# Patient Record
Sex: Male | Born: 1970 | Race: White | Hispanic: No | Smoking: Current every day smoker
Health system: Southern US, Community
[De-identification: ages and names within clinical notes are randomized; demographics above are authoritative.]

## PROBLEM LIST (undated history)

## (undated) DIAGNOSIS — J449 Chronic obstructive pulmonary disease, unspecified: Secondary | ICD-10-CM

## (undated) DIAGNOSIS — Z794 Long term (current) use of insulin: Secondary | ICD-10-CM

## (undated) DIAGNOSIS — K08109 Complete loss of teeth, unspecified cause, unspecified class: Secondary | ICD-10-CM

## (undated) DIAGNOSIS — I509 Heart failure, unspecified: Secondary | ICD-10-CM

## (undated) DIAGNOSIS — IMO0001 Reserved for inherently not codable concepts without codable children: Secondary | ICD-10-CM

## (undated) DIAGNOSIS — J45909 Unspecified asthma, uncomplicated: Secondary | ICD-10-CM

## (undated) DIAGNOSIS — M109 Gout, unspecified: Secondary | ICD-10-CM

## (undated) DIAGNOSIS — M545 Low back pain, unspecified: Secondary | ICD-10-CM

## (undated) DIAGNOSIS — R0600 Dyspnea, unspecified: Secondary | ICD-10-CM

## (undated) DIAGNOSIS — I10 Essential (primary) hypertension: Secondary | ICD-10-CM

## (undated) DIAGNOSIS — K219 Gastro-esophageal reflux disease without esophagitis: Secondary | ICD-10-CM

## (undated) DIAGNOSIS — C801 Malignant (primary) neoplasm, unspecified: Secondary | ICD-10-CM

## (undated) DIAGNOSIS — Z8619 Personal history of other infectious and parasitic diseases: Secondary | ICD-10-CM

## (undated) DIAGNOSIS — F410 Panic disorder [episodic paroxysmal anxiety] without agoraphobia: Secondary | ICD-10-CM

## (undated) DIAGNOSIS — F419 Anxiety disorder, unspecified: Secondary | ICD-10-CM

## (undated) DIAGNOSIS — R05 Cough: Secondary | ICD-10-CM

## (undated) DIAGNOSIS — M25511 Pain in right shoulder: Secondary | ICD-10-CM

## (undated) DIAGNOSIS — M1990 Unspecified osteoarthritis, unspecified site: Secondary | ICD-10-CM

## (undated) DIAGNOSIS — G8929 Other chronic pain: Secondary | ICD-10-CM

## (undated) DIAGNOSIS — E7800 Pure hypercholesterolemia, unspecified: Secondary | ICD-10-CM

## (undated) DIAGNOSIS — K000 Anodontia: Secondary | ICD-10-CM

## (undated) DIAGNOSIS — E119 Type 2 diabetes mellitus without complications: Secondary | ICD-10-CM

## (undated) DIAGNOSIS — R053 Chronic cough: Secondary | ICD-10-CM

## (undated) HISTORY — PX: COLON SURGERY: SHX602

## (undated) HISTORY — DX: Malignant (primary) neoplasm, unspecified: C80.1

---

## 2002-05-25 ENCOUNTER — Emergency Department (HOSPITAL_COMMUNITY): Admission: EM | Admit: 2002-05-25 | Discharge: 2002-05-26 | Payer: Self-pay | Admitting: Emergency Medicine

## 2002-05-26 ENCOUNTER — Encounter: Payer: Self-pay | Admitting: Emergency Medicine

## 2002-10-30 ENCOUNTER — Emergency Department (HOSPITAL_COMMUNITY): Admission: EM | Admit: 2002-10-30 | Discharge: 2002-10-30 | Payer: Self-pay | Admitting: Emergency Medicine

## 2008-07-30 HISTORY — PX: MANDIBLE SURGERY: SHX707

## 2008-09-22 ENCOUNTER — Emergency Department (HOSPITAL_COMMUNITY): Admission: EM | Admit: 2008-09-22 | Discharge: 2008-09-22 | Payer: Self-pay | Admitting: Family Medicine

## 2009-07-04 ENCOUNTER — Inpatient Hospital Stay (HOSPITAL_COMMUNITY): Admission: EM | Admit: 2009-07-04 | Discharge: 2009-07-05 | Payer: Self-pay | Admitting: Emergency Medicine

## 2009-07-05 ENCOUNTER — Encounter (INDEPENDENT_AMBULATORY_CARE_PROVIDER_SITE_OTHER): Payer: Self-pay | Admitting: General Surgery

## 2009-07-05 HISTORY — PX: LAPAROSCOPIC APPENDECTOMY: SHX408

## 2010-07-30 DIAGNOSIS — C642 Malignant neoplasm of left kidney, except renal pelvis: Secondary | ICD-10-CM

## 2010-07-30 HISTORY — DX: Malignant neoplasm of left kidney, except renal pelvis: C64.2

## 2010-10-31 LAB — DIFFERENTIAL
Basophils Relative: 0 % (ref 0–1)
Eosinophils Absolute: 0.1 10*3/uL (ref 0.0–0.7)
Eosinophils Relative: 1 % (ref 0–5)
Lymphs Abs: 1.9 10*3/uL (ref 0.7–4.0)
Monocytes Absolute: 1 10*3/uL (ref 0.1–1.0)
Monocytes Relative: 6 % (ref 3–12)
Neutrophils Relative %: 82 % — ABNORMAL HIGH (ref 43–77)

## 2010-10-31 LAB — URINALYSIS, ROUTINE W REFLEX MICROSCOPIC
Bilirubin Urine: NEGATIVE
Glucose, UA: NEGATIVE mg/dL
Hgb urine dipstick: NEGATIVE
Ketones, ur: NEGATIVE mg/dL
Nitrite: NEGATIVE
Protein, ur: NEGATIVE mg/dL
Specific Gravity, Urine: 1.023 (ref 1.005–1.030)
Urobilinogen, UA: 1 mg/dL (ref 0.0–1.0)
pH: 7.5 (ref 5.0–8.0)

## 2010-10-31 LAB — BASIC METABOLIC PANEL
CO2: 25 mEq/L (ref 19–32)
Chloride: 104 mEq/L (ref 96–112)
Creatinine, Ser: 0.89 mg/dL (ref 0.4–1.5)
GFR calc Af Amer: >60 mL/min (ref >60)
Potassium: 3.9 mEq/L (ref 3.5–5.1)

## 2010-10-31 LAB — CBC
HCT: 47.9 % (ref 39.0–52.0)
MCHC: 34.4 g/dL (ref 30.0–36.0)
MCV: 92.8 fL (ref 78.0–100.0)
RBC: 5.16 MIL/uL (ref 4.22–5.81)

## 2011-01-01 ENCOUNTER — Emergency Department (HOSPITAL_COMMUNITY)
Admission: EM | Admit: 2011-01-01 | Discharge: 2011-01-01 | Discharge status: Against Medical Advice | Payer: No Typology Code available for payment source | Attending: Emergency Medicine | Admitting: Emergency Medicine

## 2011-01-01 DIAGNOSIS — R51 Headache: Secondary | ICD-10-CM | POA: Insufficient documentation

## 2011-01-01 DIAGNOSIS — R042 Hemoptysis: Secondary | ICD-10-CM | POA: Insufficient documentation

## 2011-01-01 DIAGNOSIS — R110 Nausea: Secondary | ICD-10-CM | POA: Insufficient documentation

## 2011-01-05 ENCOUNTER — Emergency Department (HOSPITAL_COMMUNITY)
Admission: EM | Admit: 2011-01-05 | Discharge: 2011-01-05 | Disposition: A | Discharge status: Home / Self Care | Payer: Self-pay | Attending: Emergency Medicine | Admitting: Emergency Medicine

## 2011-01-05 ENCOUNTER — Emergency Department (HOSPITAL_COMMUNITY): Payer: Self-pay

## 2011-01-05 DIAGNOSIS — I10 Essential (primary) hypertension: Secondary | ICD-10-CM | POA: Insufficient documentation

## 2011-01-05 DIAGNOSIS — S02109A Fracture of base of skull, unspecified side, initial encounter for closed fracture: Secondary | ICD-10-CM | POA: Insufficient documentation

## 2011-01-05 DIAGNOSIS — IMO0002 Reserved for concepts with insufficient information to code with codable children: Secondary | ICD-10-CM | POA: Insufficient documentation

## 2011-01-05 DIAGNOSIS — R51 Headache: Secondary | ICD-10-CM | POA: Insufficient documentation

## 2011-01-05 DIAGNOSIS — S022XXA Fracture of nasal bones, initial encounter for closed fracture: Secondary | ICD-10-CM | POA: Insufficient documentation

## 2011-04-15 ENCOUNTER — Emergency Department (HOSPITAL_COMMUNITY)
Admission: EM | Admit: 2011-04-15 | Discharge: 2011-04-16 | Disposition: A | Discharge status: Home / Self Care | Payer: Self-pay | Attending: Emergency Medicine | Admitting: Emergency Medicine

## 2011-04-15 ENCOUNTER — Emergency Department (HOSPITAL_COMMUNITY): Payer: Self-pay

## 2011-04-15 DIAGNOSIS — F410 Panic disorder [episodic paroxysmal anxiety] without agoraphobia: Secondary | ICD-10-CM | POA: Insufficient documentation

## 2011-04-15 DIAGNOSIS — R110 Nausea: Secondary | ICD-10-CM | POA: Insufficient documentation

## 2011-04-15 DIAGNOSIS — R05 Cough: Secondary | ICD-10-CM | POA: Insufficient documentation

## 2011-04-15 DIAGNOSIS — R059 Cough, unspecified: Secondary | ICD-10-CM | POA: Insufficient documentation

## 2011-04-15 DIAGNOSIS — R0789 Other chest pain: Secondary | ICD-10-CM | POA: Insufficient documentation

## 2011-04-15 DIAGNOSIS — I10 Essential (primary) hypertension: Secondary | ICD-10-CM | POA: Insufficient documentation

## 2011-04-15 DIAGNOSIS — R0602 Shortness of breath: Secondary | ICD-10-CM | POA: Insufficient documentation

## 2011-04-15 DIAGNOSIS — R6883 Chills (without fever): Secondary | ICD-10-CM | POA: Insufficient documentation

## 2011-04-15 DIAGNOSIS — R259 Unspecified abnormal involuntary movements: Secondary | ICD-10-CM | POA: Insufficient documentation

## 2011-04-15 LAB — URINALYSIS, ROUTINE W REFLEX MICROSCOPIC
Bilirubin Urine: NEGATIVE
Hgb urine dipstick: NEGATIVE
Ketones, ur: NEGATIVE mg/dL
Nitrite: NEGATIVE
Specific Gravity, Urine: 1.005 (ref 1.005–1.030)
Urobilinogen, UA: 0.2 mg/dL (ref 0.0–1.0)

## 2011-04-15 LAB — DIFFERENTIAL
Eosinophils Absolute: 0.2 10*3/uL (ref 0.0–0.7)
Eosinophils Relative: 2 % (ref 0–5)
Lymphocytes Relative: 29 % (ref 12–46)
Lymphs Abs: 2.9 10*3/uL (ref 0.7–4.0)
Monocytes Absolute: 0.5 10*3/uL (ref 0.1–1.0)
Monocytes Relative: 5 % (ref 3–12)

## 2011-04-15 LAB — CBC
HCT: 50.1 % (ref 39.0–52.0)
MCH: 32.2 pg (ref 26.0–34.0)
MCV: 90.1 fL (ref 78.0–100.0)
Platelets: 294 10*3/uL (ref 150–400)
RDW: 12.8 % (ref 11.5–15.5)

## 2011-04-16 LAB — RAPID URINE DRUG SCREEN, HOSP PERFORMED
Amphetamines: NOT DETECTED
Barbiturates: NOT DETECTED
Benzodiazepines: NOT DETECTED

## 2011-04-16 LAB — BASIC METABOLIC PANEL
BUN: 7 mg/dL (ref 6–23)
Chloride: 102 mEq/L (ref 96–112)
Creatinine, Ser: 0.7 mg/dL (ref 0.50–1.35)
GFR calc Af Amer: >60 mL/min (ref >60)
GFR calc non Af Amer: >60 mL/min (ref >60)
Glucose, Bld: 98 mg/dL (ref 70–99)
Potassium: 4.6 mEq/L (ref 3.5–5.1)

## 2012-07-30 DIAGNOSIS — Z8619 Personal history of other infectious and parasitic diseases: Secondary | ICD-10-CM

## 2012-07-30 HISTORY — DX: Personal history of other infectious and parasitic diseases: Z86.19

## 2012-10-06 ENCOUNTER — Encounter (HOSPITAL_COMMUNITY): Payer: Self-pay | Admitting: Emergency Medicine

## 2012-10-06 ENCOUNTER — Emergency Department (HOSPITAL_COMMUNITY)
Admission: EM | Admit: 2012-10-06 | Discharge: 2012-10-06 | Disposition: A | Discharge status: Home / Self Care | Payer: Self-pay | Attending: Emergency Medicine | Admitting: Emergency Medicine

## 2012-10-06 DIAGNOSIS — M2540 Effusion, unspecified joint: Secondary | ICD-10-CM | POA: Insufficient documentation

## 2012-10-06 DIAGNOSIS — M109 Gout, unspecified: Secondary | ICD-10-CM | POA: Insufficient documentation

## 2012-10-06 DIAGNOSIS — I10 Essential (primary) hypertension: Secondary | ICD-10-CM | POA: Insufficient documentation

## 2012-10-06 DIAGNOSIS — F17200 Nicotine dependence, unspecified, uncomplicated: Secondary | ICD-10-CM | POA: Insufficient documentation

## 2012-10-06 DIAGNOSIS — M2550 Pain in unspecified joint: Secondary | ICD-10-CM | POA: Insufficient documentation

## 2012-10-06 DIAGNOSIS — Z79899 Other long term (current) drug therapy: Secondary | ICD-10-CM | POA: Insufficient documentation

## 2012-10-06 HISTORY — DX: Gout, unspecified: M10.9

## 2012-10-06 HISTORY — DX: Essential (primary) hypertension: I10

## 2012-10-06 LAB — GLUCOSE, CAPILLARY: Glucose-Capillary: 149 mg/dL — ABNORMAL HIGH (ref 70–99)

## 2012-10-06 MED ORDER — OXYCODONE-ACETAMINOPHEN 5-325 MG PO TABS: 1.0000 | ORAL_TABLET | ORAL | Status: DC | PRN

## 2012-10-06 MED ORDER — HYDROCHLOROTHIAZIDE 25 MG PO TABS: 25.0000 mg | ORAL_TABLET | Freq: Every day | ORAL | Status: DC

## 2012-10-06 MED ORDER — INDOMETHACIN 25 MG PO CAPS: 50.0000 mg | ORAL_CAPSULE | Freq: Three times a day (TID) | ORAL | Status: DC

## 2012-10-06 MED ORDER — OXYCODONE-ACETAMINOPHEN 5-325 MG PO TABS
1.0000 | ORAL_TABLET | Freq: Once | ORAL | Status: AC
  Administered 2012-10-06: 1 via ORAL
  Filled 2012-10-06: qty 1

## 2012-10-06 NOTE — ED Notes (Signed)
Pt c/o left great toe pain from gout x several months

## 2012-10-06 NOTE — ED Provider Notes (Signed)
History     CSN: [id number]  Arrival date & time 10/06/12  1050   First MD Initiated Contact with Patient 10/06/12 1220      Chief Complaint  Patient presents with  . Toe Pain    (Consider location/radiation/quality/duration/timing/severity/associated sxs/prior treatment) Patient is a 42 y.o. male presenting with toe pain. The history is provided by the patient.  Toe Pain This is a recurrent problem. The problem occurs constantly. Associated symptoms include arthralgias and joint swelling. Pertinent negatives include no fever or numbness. Associated symptoms comments: He has a history of recurrent episodes of gout that cause same symptoms as pain, redness and swelling of the 1st MTP like he is having today. No fever. He takes colchicine but is not getting any relief. .    Past Medical History  Diagnosis Date  . Gout   . Hypertension     History reviewed. No pertinent past surgical history.  History reviewed. No pertinent family history.  History  Substance Use Topics  . Smoking status: Current Every Day Smoker  . Smokeless tobacco: Not on file  . Alcohol Use: Yes      Review of Systems  Constitutional: Negative for fever.  Musculoskeletal: Positive for joint swelling and arthralgias.  Skin: Positive for color change.  Neurological: Negative for numbness.    Allergies  Review of patient's allergies indicates no known allergies.  Home Medications   Current Outpatient Rx  Name  Route  Sig  Dispense  Refill  . albuterol (PROVENTIL HFA;VENTOLIN HFA) 108 (90 BASE) MCG/ACT inhaler   Inhalation   Inhale 2 puffs into the lungs every 6 (six) hours as needed for wheezing.         . colchicine 0.6 MG tablet   Oral   Take 0.6 mg by mouth daily.         [name] ibuprofen (ADVIL,MOTRIN) 200 MG tablet   Oral   Take 400 mg by mouth every 6 (six) hours as needed for pain.         [name] ibuprofen (ADVIL,MOTRIN) 800 MG tablet   Oral   Take 800 mg by mouth at bedtime as  needed for pain.           BP 185/118  Pulse 100  Temp(Src) 98.8 F (37.1 C) (Oral)  Resp 18  SpO2 98%  Physical Exam  Constitutional: He is oriented to person, place, and time. He appears well-developed and well-nourished. No distress.  Pulmonary/Chest: Effort normal.  Musculoskeletal: Normal range of motion.  Left foot swollen over 1st MTP joint with redness. Significantly tender to any palpation. Toe and remainder forefoot unremarkable.   Neurological: He is alert and oriented to person, place, and time.  Skin: Skin is warm. There is erythema.  Left 1st MTP.    ED Course  Procedures (including critical care time)  Labs Reviewed - No data to display No results found.   No diagnosis found.  1. Gout 2. Hypertension   MDM  Symptoms and presentation c/w gout. His blood pressure is also significantly elevated with a history of same and medication non-compliance for the past 4 years. No chest pain, shortness of breath. Will verify pressure with manual measurement and consider HCTZ. Will give pain medication for gout and Indomethacin.        Arnoldo Hooker, PA-C 10/06/12 1419

## 2012-10-06 NOTE — ED Notes (Signed)
Patient states he has been having trouble with his L great toe off and on since Thanksgiving.  Patient states he has been taking colchicine for flare ups, but that it is not helping swelling and pain this time.

## 2012-10-07 NOTE — ED Provider Notes (Signed)
Medical screening examination/treatment/procedure(s) were performed by non-physician practitioner and as supervising physician I was immediately available for consultation/collaboration.   David Yelverton, MD 10/07/12 0757 

## 2012-11-12 ENCOUNTER — Encounter (HOSPITAL_COMMUNITY): Payer: Self-pay | Admitting: Emergency Medicine

## 2012-11-12 DIAGNOSIS — R112 Nausea with vomiting, unspecified: Secondary | ICD-10-CM | POA: Insufficient documentation

## 2012-11-12 DIAGNOSIS — R61 Generalized hyperhidrosis: Secondary | ICD-10-CM | POA: Insufficient documentation

## 2012-11-12 DIAGNOSIS — R197 Diarrhea, unspecified: Secondary | ICD-10-CM | POA: Insufficient documentation

## 2012-11-12 DIAGNOSIS — N509 Disorder of male genital organs, unspecified: Secondary | ICD-10-CM | POA: Insufficient documentation

## 2012-11-12 DIAGNOSIS — F17200 Nicotine dependence, unspecified, uncomplicated: Secondary | ICD-10-CM | POA: Insufficient documentation

## 2012-11-12 DIAGNOSIS — K921 Melena: Secondary | ICD-10-CM | POA: Insufficient documentation

## 2012-11-12 DIAGNOSIS — I10 Essential (primary) hypertension: Secondary | ICD-10-CM | POA: Insufficient documentation

## 2012-11-12 DIAGNOSIS — R42 Dizziness and giddiness: Secondary | ICD-10-CM | POA: Insufficient documentation

## 2012-11-12 DIAGNOSIS — R6883 Chills (without fever): Secondary | ICD-10-CM | POA: Insufficient documentation

## 2012-11-12 DIAGNOSIS — Z79899 Other long term (current) drug therapy: Secondary | ICD-10-CM | POA: Insufficient documentation

## 2012-11-12 DIAGNOSIS — R109 Unspecified abdominal pain: Secondary | ICD-10-CM | POA: Insufficient documentation

## 2012-11-12 DIAGNOSIS — M109 Gout, unspecified: Secondary | ICD-10-CM | POA: Insufficient documentation

## 2012-11-12 NOTE — ED Notes (Signed)
PT. REPORTS RECTAL BLEEDING WITH LOW ABDOMINAL PAIN ONSET TODAY .

## 2012-11-13 ENCOUNTER — Emergency Department (HOSPITAL_COMMUNITY)
Admission: EM | Admit: 2012-11-13 | Discharge: 2012-11-13 | Disposition: A | Discharge status: Home / Self Care | Payer: Medicaid Other | Attending: Emergency Medicine | Admitting: Emergency Medicine

## 2012-11-13 ENCOUNTER — Encounter (HOSPITAL_COMMUNITY): Payer: Self-pay | Admitting: Emergency Medicine

## 2012-11-13 ENCOUNTER — Inpatient Hospital Stay (HOSPITAL_COMMUNITY)
Admission: EM | Admit: 2012-11-13 | Discharge: 2012-11-18 | DRG: 392 | Disposition: A | Discharge status: Home / Self Care | Payer: Medicaid Other | Attending: Internal Medicine | Admitting: Internal Medicine

## 2012-11-13 DIAGNOSIS — K5792 Diverticulitis of intestine, part unspecified, without perforation or abscess without bleeding: Secondary | ICD-10-CM

## 2012-11-13 DIAGNOSIS — E871 Hypo-osmolality and hyponatremia: Secondary | ICD-10-CM | POA: Diagnosis present

## 2012-11-13 DIAGNOSIS — K921 Melena: Secondary | ICD-10-CM

## 2012-11-13 DIAGNOSIS — I161 Hypertensive emergency: Secondary | ICD-10-CM | POA: Diagnosis present

## 2012-11-13 DIAGNOSIS — K5732 Diverticulitis of large intestine without perforation or abscess without bleeding: Secondary | ICD-10-CM | POA: Diagnosis present

## 2012-11-13 DIAGNOSIS — M109 Gout, unspecified: Secondary | ICD-10-CM | POA: Diagnosis present

## 2012-11-13 DIAGNOSIS — E876 Hypokalemia: Secondary | ICD-10-CM | POA: Diagnosis present

## 2012-11-13 DIAGNOSIS — F17200 Nicotine dependence, unspecified, uncomplicated: Secondary | ICD-10-CM | POA: Diagnosis present

## 2012-11-13 DIAGNOSIS — D72829 Elevated white blood cell count, unspecified: Secondary | ICD-10-CM

## 2012-11-13 DIAGNOSIS — I10 Essential (primary) hypertension: Secondary | ICD-10-CM | POA: Diagnosis present

## 2012-11-13 LAB — COMPREHENSIVE METABOLIC PANEL WITH GFR
ALT: 65 U/L — ABNORMAL HIGH (ref 0–53)
AST: 30 U/L (ref 0–37)
Albumin: 4.3 g/dL (ref 3.5–5.2)
Alkaline Phosphatase: 73 U/L (ref 39–117)
BUN: 7 mg/dL (ref 6–23)
CO2: 27 meq/L (ref 19–32)
Calcium: 10.2 mg/dL (ref 8.4–10.5)
Chloride: 95 meq/L — ABNORMAL LOW (ref 96–112)
Creatinine, Ser: 0.8 mg/dL (ref 0.50–1.35)
GFR calc Af Amer: >90 mL/min
GFR calc non Af Amer: >90 mL/min
Glucose, Bld: 102 mg/dL — ABNORMAL HIGH (ref 70–99)
Potassium: 3.4 meq/L — ABNORMAL LOW (ref 3.5–5.1)
Sodium: 135 meq/L (ref 135–145)
Total Bilirubin: 0.4 mg/dL (ref 0.3–1.2)
Total Protein: 8.1 g/dL (ref 6.0–8.3)

## 2012-11-13 LAB — SAMPLE TO BLOOD BANK

## 2012-11-13 LAB — URINALYSIS, ROUTINE W REFLEX MICROSCOPIC
Bilirubin Urine: NEGATIVE
Glucose, UA: NEGATIVE mg/dL
Nitrite: NEGATIVE
Specific Gravity, Urine: 1.021 (ref 1.005–1.030)
pH: 6 (ref 5.0–8.0)

## 2012-11-13 LAB — CBC WITH DIFFERENTIAL/PLATELET
Basophils Absolute: 0 10*3/uL (ref 0.0–0.1)
Basophils Relative: 0 % (ref 0–1)
Eosinophils Absolute: 0.1 10*3/uL (ref 0.0–0.7)
Eosinophils Relative: 1 % (ref 0–5)
MCH: 32.4 pg (ref 26.0–34.0)
MCV: 88.8 fL (ref 78.0–100.0)
Neutrophils Relative %: 81 % — ABNORMAL HIGH (ref 43–77)
Platelets: 278 10*3/uL (ref 150–400)
RDW: 13.1 % (ref 11.5–15.5)

## 2012-11-13 LAB — APTT: aPTT: 32 seconds (ref 24–37)

## 2012-11-13 LAB — PROTIME-INR
INR: 1.02 (ref 0.00–1.49)
Prothrombin Time: 13.3 seconds (ref 11.6–15.2)

## 2012-11-13 LAB — CBC
HCT: 49.9 % (ref 39.0–52.0)
Hemoglobin: 18.4 g/dL — ABNORMAL HIGH (ref 13.0–17.0)
MCHC: 36.9 g/dL — ABNORMAL HIGH (ref 30.0–36.0)
RBC: 5.65 MIL/uL (ref 4.22–5.81)

## 2012-11-13 MED ORDER — SODIUM CHLORIDE 0.9 % IV BOLUS (SEPSIS)
1000.0000 mL | Freq: Once | INTRAVENOUS | Status: AC
  Administered 2012-11-14: 1000 mL via INTRAVENOUS

## 2012-11-13 MED ORDER — OMEPRAZOLE 20 MG PO CPDR: 20.0000 mg | DELAYED_RELEASE_CAPSULE | Freq: Every day | ORAL | Status: DC

## 2012-11-13 MED ORDER — ONDANSETRON HCL 4 MG/2ML IJ SOLN
4.0000 mg | Freq: Once | INTRAMUSCULAR | Status: AC
  Administered 2012-11-14: 4 mg via INTRAVENOUS
  Filled 2012-11-13: qty 2

## 2012-11-13 MED ORDER — FENTANYL CITRATE 0.05 MG/ML IJ SOLN
100.0000 ug | Freq: Once | INTRAMUSCULAR | Status: AC
  Administered 2012-11-14: 100 ug via INTRAVENOUS
  Filled 2012-11-13: qty 2

## 2012-11-13 MED ORDER — PANTOPRAZOLE SODIUM 40 MG IV SOLR
40.0000 mg | Freq: Once | INTRAVENOUS | Status: AC
  Administered 2012-11-13: 40 mg via INTRAVENOUS
  Filled 2012-11-13: qty 40

## 2012-11-13 MED ORDER — SODIUM CHLORIDE 0.9 % IV BOLUS (SEPSIS)
1000.0000 mL | Freq: Once | INTRAVENOUS | Status: AC
  Administered 2012-11-13: 1000 mL via INTRAVENOUS

## 2012-11-13 NOTE — ED Provider Notes (Signed)
History     CSN: [id number]  Arrival date & time 11/13/12  2052   First MD Initiated Contact with Patient 11/13/12 2336      Chief Complaint  Patient presents with  . Abdominal Pain    (Consider location/radiation/quality/duration/timing/severity/associated sxs/prior treatment) HPI This is a 42 year old male with a two-day history of lower abdominal pain accompanied by bright red blood per rectum. His stools were initially diarrhea mixed with blood but have become grossly bloody. He has also had nausea and vomiting and a decreased appetite during this time period. The pain is dull and is described as severe at times. It is worse with movement, palpation or attempts at bowel movements. He was seen in the ED yesterday evening but states he was given no medications treated nor was he evaluated by CAT scan. He returns because of pain persists. He denies associated chest pain or shortness of breath. He does have lightheadedness. His urine is dark.  Past Medical History  Diagnosis Date  . Gout   . Hypertension     Past Surgical History  Procedure Laterality Date  . Appendectomy      No family history on file.  History  Substance Use Topics  . Smoking status: Current Every Day Smoker  . Smokeless tobacco: Not on file  . Alcohol Use: Yes      Review of Systems  All other systems reviewed and are negative.    Allergies  Review of patient's allergies indicates no known allergies.  Home Medications   Current Outpatient Rx  Name  Route  Sig  Dispense  Refill  . acetaminophen (TYLENOL) 500 MG tablet   Oral   Take 1,000 mg by mouth every 4 (four) hours as needed for pain.         [name] albuterol (PROVENTIL HFA;VENTOLIN HFA) 108 (90 BASE) MCG/ACT inhaler   Inhalation   Inhale 2 puffs into the lungs every 6 (six) hours as needed for wheezing.         . colchicine 0.6 MG tablet   Oral   Take 0.6 mg by mouth every 3 (three) hours as needed (for gout pain).          .  hydrochlorothiazide (HYDRODIURIL) 25 MG tablet   Oral   Take 1 tablet (25 mg total) by mouth daily.   30 tablet   1   . ibuprofen (ADVIL,MOTRIN) 200 MG tablet   Oral   Take 800 mg by mouth every 6 (six) hours as needed for pain.          [name] omeprazole (PRILOSEC) 20 MG capsule   Oral   Take 1 capsule (20 mg total) by mouth daily.   30 capsule   0   . oxyCODONE-acetaminophen (PERCOCET/ROXICET) 5-325 MG per tablet   Oral   Take 1-2 tablets by mouth every 4 (four) hours as needed for pain.   20 tablet   0     BP 152/105  Pulse 102  Temp(Src) 98.3 F (36.8 C) (Oral)  Resp 20  SpO2 97%  Physical Exam General: Well-developed, well-nourished male in no acute distress; appearance consistent with age of record HENT: normocephalic, atraumatic Eyes: pupils equal round and reactive to light; extraocular muscles intact Neck: supple Heart: regular rate and rhythm Lungs: clear to auscultation bilaterally Abdomen: soft; nondistended; diffuse tenderness more prominent in the lower abdomen; no masses or hepatosplenomegaly; bowel sounds present Rectal: Normal sphincter tone; no stool or blood in vault Extremities: No deformity; full range  of motion; pulses normal Neurologic: Awake, alert and oriented; motor function intact in all extremities and symmetric; no facial droop Skin: Warm and dry Psychiatric: Normal mood and affect    ED Course  Procedures (including critical care time)     MDM   Nursing notes and vitals signs, including pulse oximetry, reviewed.  Summary of this visit's results, reviewed by myself:  Labs:  Results for orders placed during the hospital encounter of 11/13/12 (from the past 24 hour(s))  CBC WITH DIFFERENTIAL     Status: Abnormal   Collection Time    11/13/12 11:56 PM      Result Value Range   WBC 19.8 (*) 4.0 - 10.5 K/uL   RBC 5.52  4.22 - 5.81 MIL/uL   Hemoglobin 17.8 (*) 13.0 - 17.0 g/dL   HCT 09.8  11.9 - 14.7 %   MCV 89.1  78.0 - 100.0  fL   MCH 32.2  26.0 - 34.0 pg   MCHC 36.2 (*) 30.0 - 36.0 g/dL   RDW 82.9  56.2 - 13.0 %   Platelets 259  150 - 400 K/uL   Neutrophils Relative 88 (*) 43 - 77 %   Neutro Abs 17.4 (*) 1.7 - 7.7 K/uL   Lymphocytes Relative 5 (*) 12 - 46 %   Lymphs Abs 1.0  0.7 - 4.0 K/uL   Monocytes Relative 6  3 - 12 %   Monocytes Absolute 1.3 (*) 0.1 - 1.0 K/uL   Eosinophils Relative 0  0 - 5 %   Eosinophils Absolute 0.0  0.0 - 0.7 K/uL   Basophils Relative 0  0 - 1 %   Basophils Absolute 0.0  0.0 - 0.1 K/uL  BASIC METABOLIC PANEL     Status: Abnormal   Collection Time    11/13/12 11:56 PM      Result Value Range   Sodium 135  135 - 145 mEq/L   Potassium 3.4 (*) 3.5 - 5.1 mEq/L   Chloride 96  96 - 112 mEq/L   CO2 26  19 - 32 mEq/L   Glucose, Bld 108 (*) 70 - 99 mg/dL   BUN 7  6 - 23 mg/dL   Creatinine, Ser 8.65  0.50 - 1.35 mg/dL   Calcium 9.8  8.4 - 78.4 mg/dL   GFR calc non Af Amer >90  >90 mL/min   GFR calc Af Amer >90  >90 mL/min  URINALYSIS, ROUTINE W REFLEX MICROSCOPIC     Status: Abnormal   Collection Time    11/13/12 11:57 PM      Result Value Range   Color, Urine ORANGE (*) YELLOW   APPearance CLEAR  CLEAR   Specific Gravity, Urine 1.033 (*) 1.005 - 1.030   pH 6.0  5.0 - 8.0   Glucose, UA NEGATIVE  NEGATIVE mg/dL   Hgb urine dipstick NEGATIVE  NEGATIVE   Bilirubin Urine SMALL (*) NEGATIVE   Ketones, ur 15 (*) NEGATIVE mg/dL   Protein, ur 696 (*) NEGATIVE mg/dL   Urobilinogen, UA 1.0  0.0 - 1.0 mg/dL   Nitrite NEGATIVE  NEGATIVE   Leukocytes, UA TRACE (*) NEGATIVE  URINE MICROSCOPIC-ADD ON     Status: Abnormal   Collection Time    11/13/12 11:57 PM      Result Value Range   Squamous Epithelial / LPF FEW (*) RARE   WBC, UA 0-2  <3 WBC/hpf   Bacteria, UA RARE  RARE  OCCULT BLOOD, POC DEVICE  Status: None   Collection Time    11/14/12 12:06 AM      Result Value Range   Fecal Occult Bld NEGATIVE  NEGATIVE    Imaging Studies: Ct Abdomen Pelvis W Contrast  11/14/2012   *RADIOLOGY REPORT*  Clinical Data: Abdominal pain; rectal bleeding.  Nausea, vomiting and diarrhea.  CT ABDOMEN AND PELVIS WITH CONTRAST  Technique:  Multidetector CT imaging of the abdomen and pelvis was performed following the standard protocol during bolus administration of intravenous contrast.  Contrast: OMNIPAQUE IOHEXOL 300 MG/ML  SOLN  Comparison: CT of the abdomen and pelvis performed 07/04/2009  Findings: The visualized lung bases are clear.  The liver and spleen are unremarkable in appearance.  The gallbladder is within normal limits.  The pancreas and adrenal glands are unremarkable.  Minimal nonspecific perinephric stranding is noted bilaterally.  An apparent tiny right renal cyst is stable from 2010 and likely benign.  No hydronephrosis is seen.  No renal or ureteral stones are identified.  The small bowel is unremarkable in appearance.  The stomach is within normal limits.  No acute vascular abnormalities are seen. Prominent mesenteric nodes may reflect the patient's pelvic process.  The patient is status post appendectomy.  There is marked soft tissue inflammation along the proximal to mid sigmoid colon, with significant wall thickening and several inflamed diverticula.  There is a collection of fluid and air noted along the anterior inferior aspect of the sigmoid colon, measuring 3.7 x 2.7 cm, which could reflect a focal microperforation or possibly a wall abscess.  No significant free air or free fluid is noted within the remainder of the abdomen or pelvis to suggest frank perforation.  Extensive soft tissue inflammation and a small amount of fluid are seen tracking about the pelvis, with marked associated soft tissue inflammation along the bladder and associated bladder wall thickening.  The prostate remains normal in size.  No inguinal lymphadenopathy is seen.  No acute osseous abnormalities are identified.  Mild degenerative change is noted at the right hip joint.  IMPRESSION:  1.  Severe  acute diverticulitis involving the proximal to mid sigmoid colon, with marked soft tissue inflammation and significant wall thickening.  Collection of fluid and air along the anterior inferior aspect of the sigmoid colon, measuring 3.7 x 2.7 cm, could reflect a focal microperforation or possibly a wall abscess.  No significant free air or free fluid seen within the remainder of the abdomen or pelvis to suggest frank perforation.  Extensive soft tissue inflammation and a small amount of fluid noted tracking about the pelvis, with marked soft tissue inflammation along the bladder and associated bladder wall thickening. 2.  Prominent mesenteric nodes may reflect the colonic process. 3.  Stable likely tiny right renal cyst noted. 4.  Mild degenerative change at the right hip joint.  These results were called by telephone on 11/14/2012 at 01:55 a.m. to Dr. [name], who verbally acknowledged these results.   Original Report Authenticated By: Tonia Ghent, M.D.    2:23 AM Will start Cipro and Flagyl after consultation with Dr. Heidi Dach, MD 11/14/12 [phone]

## 2012-11-13 NOTE — ED Notes (Signed)
PT. REPORTS PERSISTENT LOW ABDOMINAL PAIN WITH EMESIS AND BLOODY STOOLS  FOR 2 DAYS SEEN HERE YESTERDAY DISCHARGE WITH PRESCRIPTION PRILOSEC .

## 2012-11-13 NOTE — ED Notes (Signed)
Pt c/o lower abdominal pain with n/v/d since yesterday.  States diarrhea turned to BRB tonight.  C/o 10/10 abdominal pain and nausea at this time.

## 2012-11-13 NOTE — ED Provider Notes (Signed)
History     CSN: [id number]  Arrival date & time 11/12/12  2340   First MD Initiated Contact with Patient 11/13/12 0050      Chief Complaint  Patient presents with  . Rectal Bleeding    (Consider location/radiation/quality/duration/timing/severity/associated sxs/prior treatment) HPI Comments: 42 yo white male with hx of gout and hypertension presents to the ED complaining of rectal bleeding and lower abdominal pain. Bleeding started this morning and has progressively worsened. He describes the blood as bright red and admits to having 8-9 episodes today. He had a normal BM this am followed by diarrhea and then blood.  His abdominal pain has been waxing and waning x 2-3 weeks but never has been this severe. Pain is located in the left and right lower abdomen, he describes the pain as a pressure that worsens with right before and during a BM and relieved by cupping his lower belly. He denies radiation.  Assoc. symptoms include N/V, sweats/chills starting this morning.   On ROS, he admits to testicular pressure but denies dysuria, hematuria, urinary freq., and flank pain. He admits to lightheadness but denies blurred vision. SH reveal he smokes 1.5 packs/ day x 28 years and drink 10-12 beers per night every night. FHx- patients denies any family history of colon cancer. PMH- pt takes Colchicine for gout and HCTZ for hypertension. Admits to taking 4-5 Colchicine this morning. And also took Vicadin and 1/2 Percocet this am.   Patient is a 42 y.o. male presenting with hematochezia. The history is provided by the patient.  Rectal Bleeding  Associated symptoms include abdominal pain, nausea and vomiting. Pertinent negatives include no fever, no chest pain, no headaches and no coughing.    Past Medical History  Diagnosis Date  . Gout   . Hypertension     Past Surgical History  Procedure Laterality Date  . Appendectomy      No family history on file.  History  Substance Use Topics  .  Smoking status: Current Every Day Smoker  . Smokeless tobacco: Not on file  . Alcohol Use: Yes      Review of Systems  Constitutional: Negative for fever, chills and activity change.  HENT: Negative for neck pain.   Eyes: Negative for visual disturbance.  Respiratory: Negative for cough, chest tightness and shortness of breath.   Cardiovascular: Negative for chest pain.  Gastrointestinal: Positive for nausea, vomiting, abdominal pain, blood in stool and hematochezia. Negative for abdominal distention.  Genitourinary: Negative for dysuria, enuresis and difficulty urinating.  Musculoskeletal: Negative for arthralgias.  Neurological: Negative for dizziness, light-headedness and headaches.  Psychiatric/Behavioral: Negative for confusion.    Allergies  Review of patient's allergies indicates no known allergies.  Home Medications   Current Outpatient Rx  Name  Route  Sig  Dispense  Refill  . albuterol (PROVENTIL HFA;VENTOLIN HFA) 108 (90 BASE) MCG/ACT inhaler   Inhalation   Inhale 2 puffs into the lungs every 6 (six) hours as needed for wheezing.         . colchicine 0.6 MG tablet   Oral   Take 0.6 mg by mouth every 3 (three) hours as needed (for gout pain).          . hydrochlorothiazide (HYDRODIURIL) 25 MG tablet   Oral   Take 1 tablet (25 mg total) by mouth daily.   30 tablet   1   . ibuprofen (ADVIL,MOTRIN) 200 MG tablet   Oral   Take 800 mg by mouth every 6 (six)  hours as needed for pain.          [name] oxyCODONE-acetaminophen (PERCOCET/ROXICET) 5-325 MG per tablet   Oral   Take 1-2 tablets by mouth every 4 (four) hours as needed for pain.   20 tablet   0     BP 163/114  Pulse 102  Temp(Src) 99.5 F (37.5 C) (Oral)  Resp 16  SpO2 94%  Physical Exam  Nursing note and vitals reviewed. Constitutional: He is oriented to person, place, and time. He appears well-developed.  HENT:  Head: Normocephalic and atraumatic.  Eyes: Conjunctivae and EOM are normal.  Pupils are equal, round, and reactive to light.  Neck: Normal range of motion. Neck supple. No JVD present.  Cardiovascular: Normal rate and regular rhythm.   Pulmonary/Chest: Effort normal and breath sounds normal.  Abdominal: Soft. Bowel sounds are normal. He exhibits no distension. There is tenderness. There is no rebound and no guarding.  Lower quadrant abd tenderness, no rebound or guarding  Neurological: He is alert and oriented to person, place, and time.  Skin: Skin is warm.    ED Course  Procedures (including critical care time)  Labs Reviewed  CBC WITH DIFFERENTIAL - Abnormal; Notable for the following:    WBC 17.0 (*)    Hemoglobin 18.0 (*)    MCHC 36.5 (*)    Neutrophils Relative 81 (*)    Neutro Abs 13.8 (*)    Lymphocytes Relative 11 (*)    Monocytes Absolute 1.3 (*)    All other components within normal limits  COMPREHENSIVE METABOLIC PANEL - Abnormal; Notable for the following:    Potassium 3.4 (*)    Chloride 95 (*)    Glucose, Bld 102 (*)    ALT 65 (*)    All other components within normal limits  URINALYSIS, ROUTINE W REFLEX MICROSCOPIC  SAMPLE TO BLOOD BANK   No results found.   No diagnosis found.    MDM  DDx includes: Esophagitis Mallory Weiss tear [place]  Variceal bleeding PUD/Gastritis/ulcers Diverticular bleed Colon cancer Rectal bleed Internal hemorrhoids External hemorrhoids   Pt comes in with cc of rectal bleeding. Pt has risk factors for UGIB - he drinks regularly, and uses nsaids frequently. Pt complains of BRBPR, but our rectal exam revealed no blood in the rectal vault. Asked patient to let us know if he has another BM. Will get repeat CBC  Derwood Kaplan, MD 11/14/12 [phone]

## 2012-11-14 ENCOUNTER — Emergency Department (HOSPITAL_COMMUNITY): Payer: Medicaid Other

## 2012-11-14 ENCOUNTER — Encounter (HOSPITAL_COMMUNITY): Payer: Self-pay | Admitting: Radiology

## 2012-11-14 DIAGNOSIS — I10 Essential (primary) hypertension: Secondary | ICD-10-CM

## 2012-11-14 DIAGNOSIS — K5732 Diverticulitis of large intestine without perforation or abscess without bleeding: Secondary | ICD-10-CM

## 2012-11-14 DIAGNOSIS — I161 Hypertensive emergency: Secondary | ICD-10-CM | POA: Diagnosis present

## 2012-11-14 LAB — URINE MICROSCOPIC-ADD ON

## 2012-11-14 LAB — CBC WITH DIFFERENTIAL/PLATELET
Basophils Absolute: 0 10*3/uL (ref 0.0–0.1)
Eosinophils Relative: 0 % (ref 0–5)
Lymphocytes Relative: 5 % — ABNORMAL LOW (ref 12–46)
Lymphs Abs: 1 10*3/uL (ref 0.7–4.0)
Neutrophils Relative %: 88 % — ABNORMAL HIGH (ref 43–77)
Platelets: 259 10*3/uL (ref 150–400)
RBC: 5.52 MIL/uL (ref 4.22–5.81)
RDW: 13 % (ref 11.5–15.5)
WBC: 19.8 10*3/uL — ABNORMAL HIGH (ref 4.0–10.5)

## 2012-11-14 LAB — URINALYSIS, ROUTINE W REFLEX MICROSCOPIC
Nitrite: NEGATIVE
Protein, ur: 100 mg/dL — AB
Specific Gravity, Urine: 1.033 — ABNORMAL HIGH (ref 1.005–1.030)
Urobilinogen, UA: 1 mg/dL (ref 0.0–1.0)

## 2012-11-14 LAB — BASIC METABOLIC PANEL
CO2: 23 mEq/L (ref 19–32)
CO2: 26 mEq/L (ref 19–32)
Calcium: 9.8 mg/dL (ref 8.4–10.5)
Chloride: 98 mEq/L (ref 96–112)
GFR calc non Af Amer: >90 mL/min (ref >90)
Glucose, Bld: 108 mg/dL — ABNORMAL HIGH (ref 70–99)
Glucose, Bld: 109 mg/dL — ABNORMAL HIGH (ref 70–99)
Potassium: 3.2 mEq/L — ABNORMAL LOW (ref 3.5–5.1)
Potassium: 3.4 mEq/L — ABNORMAL LOW (ref 3.5–5.1)
Sodium: 134 mEq/L — ABNORMAL LOW (ref 135–145)
Sodium: 135 mEq/L (ref 135–145)

## 2012-11-14 LAB — CBC
Hemoglobin: 16.5 g/dL (ref 13.0–17.0)
MCH: 32.8 pg (ref 26.0–34.0)
RBC: 5.03 MIL/uL (ref 4.22–5.81)
WBC: 21.2 10*3/uL — ABNORMAL HIGH (ref 4.0–10.5)

## 2012-11-14 LAB — OCCULT BLOOD, POC DEVICE: Fecal Occult Bld: NEGATIVE

## 2012-11-14 MED ORDER — CHLORHEXIDINE GLUCONATE 0.12 % MT SOLN
15.0000 mL | Freq: Two times a day (BID) | OROMUCOSAL | Status: DC
  Administered 2012-11-14 – 2012-11-18 (×9): 15 mL via OROMUCOSAL
  Filled 2012-11-14 (×9): qty 15

## 2012-11-14 MED ORDER — METRONIDAZOLE IN NACL 5-0.79 MG/ML-% IV SOLN
500.0000 mg | Freq: Once | INTRAVENOUS | Status: AC
  Administered 2012-11-14: 500 mg via INTRAVENOUS
  Filled 2012-11-14: qty 100

## 2012-11-14 MED ORDER — CIPROFLOXACIN IN D5W 400 MG/200ML IV SOLN
400.0000 mg | Freq: Once | INTRAVENOUS | Status: AC
  Administered 2012-11-14: 400 mg via INTRAVENOUS
  Filled 2012-11-14: qty 200

## 2012-11-14 MED ORDER — SODIUM CHLORIDE 0.9 % IV SOLN
INTRAVENOUS | Status: DC
  Administered 2012-11-14: 04:00:00 via INTRAVENOUS

## 2012-11-14 MED ORDER — CIPROFLOXACIN IN D5W 400 MG/200ML IV SOLN
400.0000 mg | Freq: Two times a day (BID) | INTRAVENOUS | Status: DC
  Administered 2012-11-14 – 2012-11-18 (×8): 400 mg via INTRAVENOUS
  Filled 2012-11-14 (×9): qty 200

## 2012-11-14 MED ORDER — COLCHICINE 0.6 MG PO TABS
0.6000 mg | ORAL_TABLET | ORAL | Status: DC | PRN
  Filled 2012-11-14: qty 1

## 2012-11-14 MED ORDER — METRONIDAZOLE IN NACL 5-0.79 MG/ML-% IV SOLN
500.0000 mg | Freq: Three times a day (TID) | INTRAVENOUS | Status: DC
  Administered 2012-11-14 – 2012-11-18 (×13): 500 mg via INTRAVENOUS
  Filled 2012-11-14 (×14): qty 100

## 2012-11-14 MED ORDER — KCL IN DEXTROSE-NACL 20-5-0.45 MEQ/L-%-% IV SOLN
INTRAVENOUS | Status: DC
  Administered 2012-11-14 – 2012-11-17 (×6): via INTRAVENOUS
  Filled 2012-11-14 (×14): qty 1000

## 2012-11-14 MED ORDER — ALBUTEROL SULFATE HFA 108 (90 BASE) MCG/ACT IN AERS: 2.0000 | INHALATION_SPRAY | Freq: Four times a day (QID) | RESPIRATORY_TRACT | Status: DC | PRN

## 2012-11-14 MED ORDER — FENTANYL CITRATE 0.05 MG/ML IJ SOLN
100.0000 ug | Freq: Once | INTRAMUSCULAR | Status: AC
  Administered 2012-11-14: 100 ug via INTRAVENOUS
  Filled 2012-11-14: qty 2

## 2012-11-14 MED ORDER — POTASSIUM CHLORIDE 10 MEQ/100ML IV SOLN
10.0000 meq | INTRAVENOUS | Status: AC
  Administered 2012-11-14 (×4): 10 meq via INTRAVENOUS
  Filled 2012-11-14 (×2): qty 100
  Filled 2012-11-14: qty 200

## 2012-11-14 MED ORDER — METOCLOPRAMIDE HCL 5 MG/ML IJ SOLN
10.0000 mg | Freq: Once | INTRAMUSCULAR | Status: AC
  Administered 2012-11-14: 10 mg via INTRAVENOUS
  Filled 2012-11-14: qty 2

## 2012-11-14 MED ORDER — PANTOPRAZOLE SODIUM 40 MG PO TBEC: 40.0000 mg | DELAYED_RELEASE_TABLET | Freq: Every day | ORAL | Status: DC

## 2012-11-14 MED ORDER — IOHEXOL 300 MG/ML  SOLN
50.0000 mL | Freq: Once | INTRAMUSCULAR | Status: AC | PRN
  Administered 2012-11-14: 50 mL via ORAL

## 2012-11-14 MED ORDER — IOHEXOL 300 MG/ML  SOLN
100.0000 mL | Freq: Once | INTRAMUSCULAR | Status: AC | PRN
  Administered 2012-11-14: 100 mL via INTRAVENOUS

## 2012-11-14 MED ORDER — BIOTENE DRY MOUTH MT LIQD
15.0000 mL | Freq: Two times a day (BID) | OROMUCOSAL | Status: DC
  Administered 2012-11-14 – 2012-11-18 (×9): 15 mL via OROMUCOSAL

## 2012-11-14 MED ORDER — PANTOPRAZOLE SODIUM 40 MG IV SOLR
40.0000 mg | Freq: Two times a day (BID) | INTRAVENOUS | Status: DC
  Administered 2012-11-14 – 2012-11-18 (×9): 40 mg via INTRAVENOUS
  Filled 2012-11-14 (×9): qty 40

## 2012-11-14 MED ORDER — PANTOPRAZOLE SODIUM 40 MG IV SOLR
40.0000 mg | Freq: Once | INTRAVENOUS | Status: AC
  Administered 2012-11-14: 40 mg via INTRAVENOUS
  Filled 2012-11-14: qty 40

## 2012-11-14 MED ORDER — HYDROMORPHONE HCL PF 1 MG/ML IJ SOLN
1.0000 mg | INTRAMUSCULAR | Status: DC | PRN
  Administered 2012-11-14 – 2012-11-17 (×23): 1 mg via INTRAVENOUS
  Filled 2012-11-14 (×24): qty 1

## 2012-11-14 MED ORDER — ACETAMINOPHEN 325 MG PO TABS
975.0000 mg | ORAL_TABLET | Freq: Once | ORAL | Status: AC
  Administered 2012-11-14: 975 mg via ORAL
  Filled 2012-11-14 (×2): qty 3

## 2012-11-14 MED ORDER — ACETAMINOPHEN 500 MG PO TABS: 1000.0000 mg | ORAL_TABLET | ORAL | Status: DC | PRN

## 2012-11-14 NOTE — ED Notes (Signed)
Report called to floor

## 2012-11-14 NOTE — Consult Note (Signed)
Reason for Consult: Diverticulitis with ?microperf/abscess Referring Physician: Dr. Lysbeth Penner Preston [name] is an 42 y.o. [name].  HPI: 42 y.o. [name] with 2 day history of lower abdominal pain and a couple of episodes of BRBPR. Stools initially diarrhea mixed with blood but have become grossly bloody he states. He has also had N/V, and decreased appetite during this period. Pain is severe at times.  Was seen in ED 2 days ago but not evaluated by CT scan.  He was discharged and presented 24 hours later with continued symptoms.  In the ED patient was guiac negative and HGB was 18, CT abd was performed and demonstrated severe diverticulitis with bowel wall abscess, no perforation or pneumoperitoneum noted. Cipro and flagyl started and hospitalist asked to admit.  We are asked to evaluate the patient surgically.   Past Medical History  Diagnosis Date  . Gout   . Hypertension     Past Surgical History  Procedure Laterality Date  . Appendectomy      History reviewed. No pertinent family history.  Social History:  reports that he has been smoking.  He does not have any smokeless tobacco history on file. He reports that  drinks alcohol. He reports that he does not use illicit drugs.  Allergies: No Known Allergies  Medications: I have reviewed the patient's current medications.  Results for orders placed during the hospital encounter of 11/13/12 (from the past 48 hour(s))  CBC WITH DIFFERENTIAL     Status: Abnormal   Collection Time    11/13/12 11:56 PM      Result Value Range   WBC 19.8 (*) 4.0 - 10.5 K/uL   RBC 5.52  4.22 - 5.81 MIL/uL   Hemoglobin 17.8 (*) 13.0 - 17.0 g/dL   HCT 16.1  09.6 - 04.5 %   MCV 89.1  78.0 - 100.0 fL   MCH 32.2  26.0 - 34.0 pg   MCHC 36.2 (*) 30.0 - 36.0 g/dL   RDW 40.9  81.1 - 91.4 %   Platelets 259  150 - 400 K/uL   Neutrophils Relative 88 (*) 43 - 77 %   Neutro Abs 17.4 (*) 1.7 - 7.7 K/uL   Lymphocytes Relative 5 (*) 12 - 46 %   Lymphs Abs 1.0  0.7  - 4.0 K/uL   Monocytes Relative 6  3 - 12 %   Monocytes Absolute 1.3 (*) 0.1 - 1.0 K/uL   Eosinophils Relative 0  0 - 5 %   Eosinophils Absolute 0.0  0.0 - 0.7 K/uL   Basophils Relative 0  0 - 1 %   Basophils Absolute 0.0  0.0 - 0.1 K/uL  BASIC METABOLIC PANEL     Status: Abnormal   Collection Time    11/13/12 11:56 PM      Result Value Range   Sodium 135  135 - 145 mEq/L   Potassium 3.4 (*) 3.5 - 5.1 mEq/L   Chloride 96  96 - 112 mEq/L   CO2 26  19 - 32 mEq/L   Glucose, Bld 108 (*) 70 - 99 mg/dL   BUN 7  6 - 23 mg/dL   Creatinine, Ser 7.82  0.50 - 1.35 mg/dL   Calcium 9.8  8.4 - 95.6 mg/dL   GFR calc non Af Amer >90  >90 mL/min   GFR calc Af Amer >90  >90 mL/min   Comment:            The eGFR has been calculated  using the CKD EPI equation.     This calculation has not been     validated in all clinical     situations.     eGFR's persistently     <90 mL/min signify     possible Chronic Kidney Disease.  URINALYSIS, ROUTINE W REFLEX MICROSCOPIC     Status: Abnormal   Collection Time    11/13/12 11:57 PM      Result Value Range   Color, Urine ORANGE (*) YELLOW   Comment: BIOCHEMICALS MAY BE AFFECTED BY COLOR   APPearance CLEAR  CLEAR   Specific Gravity, Urine 1.033 (*) 1.005 - 1.030   pH 6.0  5.0 - 8.0   Glucose, UA NEGATIVE  NEGATIVE mg/dL   Hgb urine dipstick NEGATIVE  NEGATIVE   Bilirubin Urine SMALL (*) NEGATIVE   Ketones, ur 15 (*) NEGATIVE mg/dL   Protein, ur 409 (*) NEGATIVE mg/dL   Urobilinogen, UA 1.0  0.0 - 1.0 mg/dL   Nitrite NEGATIVE  NEGATIVE   Leukocytes, UA TRACE (*) NEGATIVE  URINE MICROSCOPIC-ADD ON     Status: Abnormal   Collection Time    11/13/12 11:57 PM      Result Value Range   Squamous Epithelial / LPF FEW (*) RARE   WBC, UA 0-2  <3 WBC/hpf   Bacteria, UA RARE  RARE  OCCULT BLOOD, POC DEVICE     Status: None   Collection Time    11/14/12 12:06 AM      Result Value Range   Fecal Occult Bld NEGATIVE  NEGATIVE  CBC     Status: Abnormal    Collection Time    11/14/12  5:09 AM      Result Value Range   WBC 21.2 (*) 4.0 - 10.5 K/uL   RBC 5.03  4.22 - 5.81 MIL/uL   Hemoglobin 16.5  13.0 - 17.0 g/dL   HCT 81.1  91.4 - 78.2 %   MCV 90.1  78.0 - 100.0 fL   MCH 32.8  26.0 - 34.0 pg   MCHC 36.4 (*) 30.0 - 36.0 g/dL   RDW 95.6  21.3 - 08.6 %   Platelets 237  150 - 400 K/uL  BASIC METABOLIC PANEL     Status: Abnormal   Collection Time    11/14/12  5:09 AM      Result Value Range   Sodium 134 (*) 135 - 145 mEq/L   Potassium 3.2 (*) 3.5 - 5.1 mEq/L   Chloride 98  96 - 112 mEq/L   CO2 23  19 - 32 mEq/L   Glucose, Bld 109 (*) 70 - 99 mg/dL   BUN 6  6 - 23 mg/dL   Creatinine, Ser 5.78  0.50 - 1.35 mg/dL   Calcium 9.0  8.4 - 46.9 mg/dL   GFR calc non Af Amer >90  >90 mL/min   GFR calc Af Amer >90  >90 mL/min   Comment:            The eGFR has been calculated     using the CKD EPI equation.     This calculation has not been     validated in all clinical     situations.     eGFR's persistently     <90 mL/min signify     possible Chronic Kidney Disease.    Ct Abdomen Pelvis W Contrast  11/14/2012  *RADIOLOGY REPORT*  Clinical Data: Abdominal pain; rectal bleeding.  Nausea, vomiting and diarrhea.  CT  ABDOMEN AND PELVIS WITH CONTRAST  Technique:  Multidetector CT imaging of the abdomen and pelvis was performed following the standard protocol during bolus administration of intravenous contrast.  Contrast: OMNIPAQUE IOHEXOL 300 MG/ML  SOLN  Comparison: CT of the abdomen and pelvis performed 07/04/2009  Findings: The visualized lung bases are clear.  The liver and spleen are unremarkable in appearance.  The gallbladder is within normal limits.  The pancreas and adrenal glands are unremarkable.  Minimal nonspecific perinephric stranding is noted bilaterally.  An apparent tiny right renal cyst is stable from 2010 and likely benign.  No hydronephrosis is seen.  No renal or ureteral stones are identified.  The small bowel is  unremarkable in appearance.  The stomach is within normal limits.  No acute vascular abnormalities are seen. Prominent mesenteric nodes may reflect the patient's pelvic process.  The patient is status post appendectomy.  There is marked soft tissue inflammation along the proximal to mid sigmoid colon, with significant wall thickening and several inflamed diverticula.  There is a collection of fluid and air noted along the anterior inferior aspect of the sigmoid colon, measuring 3.7 x 2.7 cm, which could reflect a focal microperforation or possibly a wall abscess.  No significant free air or free fluid is noted within the remainder of the abdomen or pelvis to suggest frank perforation.  Extensive soft tissue inflammation and a small amount of fluid are seen tracking about the pelvis, with marked associated soft tissue inflammation along the bladder and associated bladder wall thickening.  The prostate remains normal in size.  No inguinal lymphadenopathy is seen.  No acute osseous abnormalities are identified.  Mild degenerative change is noted at the right hip joint.  IMPRESSION:  1.  Severe acute diverticulitis involving the proximal to mid sigmoid colon, with marked soft tissue inflammation and significant wall thickening.  Collection of fluid and air along the anterior inferior aspect of the sigmoid colon, measuring 3.7 x 2.7 cm, could reflect a focal microperforation or possibly a wall abscess.  No significant free air or free fluid seen within the remainder of the abdomen or pelvis to suggest frank perforation.  Extensive soft tissue inflammation and a small amount of fluid noted tracking about the pelvis, with marked soft tissue inflammation along the bladder and associated bladder wall thickening. 2.  Prominent mesenteric nodes may reflect the colonic process. 3.  Stable likely tiny right renal cyst noted. 4.  Mild degenerative change at the right hip joint.  These results were called by telephone on  11/14/2012 at 01:55 a.m. to Dr. [name], who verbally acknowledged these results.   Original Report Authenticated By: Tonia Ghent, M.D.     Review of Systems  Constitutional: Positive for fever and malaise/fatigue. Negative for chills.  HENT: Negative.   Eyes: Negative.   Respiratory: Negative.   Cardiovascular: Negative.   Gastrointestinal: Positive for nausea, vomiting, abdominal pain and constipation.  Genitourinary: Negative.   Musculoskeletal: Negative.   Skin: Negative.   Neurological: Negative.   Endo/Heme/Allergies: Negative.   Psychiatric/Behavioral: Negative.    Blood pressure 164/100, pulse 100, temperature 98.8 F (37.1 C), temperature source Oral, resp. rate 19, height 6\' 3"  (1.905 m), weight 285 lb (129.275 kg), SpO2 99.00%. Physical Exam  Constitutional: He is oriented to person, place, and time. He appears well-developed and well-nourished. No distress.  HENT:  Head: Normocephalic and atraumatic.  Eyes: Conjunctivae are normal. Pupils are equal, round, and reactive to light.  Neck: Normal range of motion.  Neck supple.  Cardiovascular: Normal rate and regular rhythm.   Respiratory: Effort normal and breath sounds normal.  GI: Bowel sounds are normal. He exhibits distension. There is tenderness (esp in suprapubic/LLQ regions). There is no guarding.  Genitourinary:  deferred  Musculoskeletal: Normal range of motion. He exhibits no edema.  Neurological: He is alert and oriented to person, place, and time.  Skin: Skin is warm.  Psychiatric: He has a normal mood and affect. His behavior is normal. Thought content normal.    Assessment/Plan: 1. Diverticulitis with Microperf?/abscess: currently the patient is stable, non septic, no free air seen and does not have peritonitis.  Recommended bowel rest under pain resolved and IV cipro flagyl.  Keep on IVfs and potassium replacements, follow exam closely.    Dr. Derrell Lolling has seen this patient as well and will make  further notes.  Preston [name] 11/14/2012, 10:33 AM

## 2012-11-14 NOTE — H&P (Signed)
[facility] History and Physical  TRAYVEN LUMADUE JXB:[id number] DOB: 03-Jun-1971 DOA: 11/13/2012  Referring physician: ED PCP: No PCP Per Patient  Specialists: None  Chief Complaint: Abdominal pain  HPI: Preston [name] is a 42 y.o. male with 2 day history of lower abdominal pain and a couple of episodes of BRBPR.  Stools initially diarrhea mixed with blood but have become grossly bloody he states.  He has also had N/V, and decreased appetite during this period.  Pain is dull but described as severe at times.  Worse with movement, palpation, or attempts at BMs.  Was seen in ED yesterday evening but not evaluated by CT scan, he returned this evening because pain persisted.  In the ED patient was guiac negative and HGB was 18, CT abd was performed and demonstrated severe diverticulitis with bowel wall abscess, no perforation or pneumoperitoneum noted.  Cipro and flagyl started and hospitalist asked to admit.  Review of Systems: 12 systems reviewed and otherwise negative.  Past Medical History  Diagnosis Date  . Gout   . Hypertension    Past Surgical History  Procedure Laterality Date  . Appendectomy     Social History:  reports that he has been smoking.  He does not have any smokeless tobacco history on file. He reports that  drinks alcohol. He reports that he does not use illicit drugs.   No Known Allergies  No family history on file. Non-contributory  Prior to Admission medications   Medication Sig Start Date End Date Taking? Authorizing Provider  acetaminophen (TYLENOL) 500 MG tablet Take 1,000 mg by mouth every 4 (four) hours as needed for pain.   Yes Historical Provider, MD  albuterol (PROVENTIL HFA;VENTOLIN HFA) 108 (90 BASE) MCG/ACT inhaler Inhale 2 puffs into the lungs every 6 (six) hours as needed for wheezing.   Yes Historical Provider, MD  colchicine 0.6 MG tablet Take 0.6 mg by mouth every 3 (three) hours as needed (for gout pain).    Yes Historical Provider, MD   hydrochlorothiazide (HYDRODIURIL) 25 MG tablet Take 1 tablet (25 mg total) by mouth daily. 10/06/12  Yes Shari A Upstill, PA-C  ibuprofen (ADVIL,MOTRIN) 200 MG tablet Take 800 mg by mouth every 6 (six) hours as needed for pain.    Yes Historical Provider, MD  omeprazole (PRILOSEC) 20 MG capsule Take 1 capsule (20 mg total) by mouth daily. 11/13/12  Yes Derwood Kaplan, MD  oxyCODONE-acetaminophen (PERCOCET/ROXICET) 5-325 MG per tablet Take 1-2 tablets by mouth every 4 (four) hours as needed for pain. 10/06/12  Yes Arnoldo Hooker, PA-C   Physical Exam: Filed Vitals:   11/13/12 2100 11/13/12 2351 11/14/12 0100 11/14/12 0321  BP: 152/105 163/99 143/94 156/101  Pulse: 102 102 103 111  Temp: 98.3 F (36.8 C) 99.1 F (37.3 C)  100.7 F (38.2 C)  TempSrc: Oral Oral  Oral  Resp: 20 18  19   Height:  6\' 3"  (1.905 m)    Weight:  129.275 kg (285 lb)    SpO2: 97% 97% 95% 95%    General:  NAD, resting comfortably in bed Eyes: PEERLA EOMI ENT: mucous membranes moist Neck: supple w/o JVD Cardiovascular: RRR w/o MRG Respiratory: CTA B Abdomen: soft, diffuse tenderness especially in lower abdomen, nd, bs+ Skin: no rash nor lesion Musculoskeletal: MAE, full ROM all 4 extremities Psychiatric: normal tone and affect Neurologic: AAOx3, grossly non-focal  Labs on Admission:  Basic Metabolic Panel:  Recent Labs Lab 11/12/12 2352 11/13/12 2356  NA 135 135  K 3.4* 3.4*  CL 95* 96  CO2 27 26  GLUCOSE 102* 108*  BUN 7 7  CREATININE 0.80 0.77  CALCIUM 10.2 9.8   Liver Function Tests:  Recent Labs Lab 11/12/12 2352  AST 30  ALT 65*  ALKPHOS 73  BILITOT 0.4  PROT 8.1  ALBUMIN 4.3   No results found for this basename: LIPASE, AMYLASE,  in the last 168 hours No results found for this basename: AMMONIA,  in the last 168 hours CBC:  Recent Labs Lab 11/12/12 2352 11/13/12 0241 11/13/12 2356  WBC 17.0* 19.7* 19.8*  NEUTROABS 13.8*  --  17.4*  HGB 18.0* 18.4* 17.8*  HCT 49.3 49.9  49.2  MCV 88.8 88.3 89.1  PLT 278 311 259   Cardiac Enzymes: No results found for this basename: CKTOTAL, CKMB, CKMBINDEX, TROPONINI,  in the last 168 hours  BNP (last 3 results) No results found for this basename: PROBNP,  in the last 8760 hours CBG: No results found for this basename: GLUCAP,  in the last 168 hours  Radiological Exams on Admission: Ct Abdomen Pelvis W Contrast  11/14/2012  *RADIOLOGY REPORT*  Clinical Data: Abdominal pain; rectal bleeding.  Nausea, vomiting and diarrhea.  CT ABDOMEN AND PELVIS WITH CONTRAST  Technique:  Multidetector CT imaging of the abdomen and pelvis was performed following the standard protocol during bolus administration of intravenous contrast.  Contrast: OMNIPAQUE IOHEXOL 300 MG/ML  SOLN  Comparison: CT of the abdomen and pelvis performed 07/04/2009  Findings: The visualized lung bases are clear.  The liver and spleen are unremarkable in appearance.  The gallbladder is within normal limits.  The pancreas and adrenal glands are unremarkable.  Minimal nonspecific perinephric stranding is noted bilaterally.  An apparent tiny right renal cyst is stable from 2010 and likely benign.  No hydronephrosis is seen.  No renal or ureteral stones are identified.  The small bowel is unremarkable in appearance.  The stomach is within normal limits.  No acute vascular abnormalities are seen. Prominent mesenteric nodes may reflect the patient's pelvic process.  The patient is status post appendectomy.  There is marked soft tissue inflammation along the proximal to mid sigmoid colon, with significant wall thickening and several inflamed diverticula.  There is a collection of fluid and air noted along the anterior inferior aspect of the sigmoid colon, measuring 3.7 x 2.7 cm, which could reflect a focal microperforation or possibly a wall abscess.  No significant free air or free fluid is noted within the remainder of the abdomen or pelvis to suggest frank perforation.   Extensive soft tissue inflammation and a small amount of fluid are seen tracking about the pelvis, with marked associated soft tissue inflammation along the bladder and associated bladder wall thickening.  The prostate remains normal in size.  No inguinal lymphadenopathy is seen.  No acute osseous abnormalities are identified.  Mild degenerative change is noted at the right hip joint.  IMPRESSION:  1.  Severe acute diverticulitis involving the proximal to mid sigmoid colon, with marked soft tissue inflammation and significant wall thickening.  Collection of fluid and air along the anterior inferior aspect of the sigmoid colon, measuring 3.7 x 2.7 cm, could reflect a focal microperforation or possibly a wall abscess.  No significant free air or free fluid seen within the remainder of the abdomen or pelvis to suggest frank perforation.  Extensive soft tissue inflammation and a small amount of fluid noted tracking about the pelvis, with marked soft tissue inflammation  along the bladder and associated bladder wall thickening. 2.  Prominent mesenteric nodes may reflect the colonic process. 3.  Stable likely tiny right renal cyst noted. 4.  Mild degenerative change at the right hip joint.  These results were called by telephone on 11/14/2012 at 01:55 a.m. to Dr. [name], who verbally acknowledged these results.   Original Report Authenticated By: Tonia Ghent, M.D.     EKG: Independently reviewed.  Assessment/Plan Principal Problem:   Diverticulitis of large intestine Active Problems:   HTN (hypertension)   1. Severe diverticulitis of large intestine - with abscess or microperforation, no frank perforation or pneumoperitoneum at this point requiring emergent surgical intervention.  Treating with cipro, flagyl, surgery consult in AM.  NPO except ice chips. 2. HTN - holding HCTZ as we are giving him fluids at this time since he is NPO, if BP rises further treat with PRN labetalol.    Code Status: Full  Code (must indicate code status--if unknown or must be presumed, indicate so) Family Communication: Spoke with wife at bedside (indicate person spoken with, if applicable, with phone number if by telephone) Disposition Plan: Admit to inpatient (indicate anticipated LOS)  Time spent: 70 min  Yanky Vanderburg M. [facility] Pager [phone]  If 7PM-7AM, please contact night-coverage [email or web address] Password [facility] 11/14/2012, 4:12 AM

## 2012-11-14 NOTE — Progress Notes (Signed)
[facility] PROGRESS NOTE  Preston [name] ZOX:[id number] DOB: [birth year] DOA: 11/13/2012 PCP: No PCP Per Patient  Assessment/Plan: 1-Diverticulitis with possible wall  Abscess vs  microperforation:  -Continue with ciprofloxacin and flagyl. -WBC increasing.  -IV fluids, electrolytes replacement.  -Surgery consulted.  2-GI bleed: no more episode, possible from diverticulitis. Monitor hb. Hb at 16.  3-DVT prophylaxis: SCD, avoid anticoagulation due to risk for bleed.  4-Hypokalemia: replace with IV kcl 4 runs. Check mg level.    Code Status: Full Family Communication: Care discussed with patient.  Disposition Plan: to be determine   Consultants:  Surgery  Procedures:  none  Antibiotics:  Ciprofloxacin 4-18  Flagyl 4-18  HPI/Subjective: Still complaining of abdominal pain.  He relates everything started with blood in the stool and abdominal pain. No nausea or vomiting.   Objective: Filed Vitals:   11/14/12 0500 11/14/12 0514 11/14/12 0546 11/14/12 0552  BP: 135/92  151/101 164/100  Pulse: 96  100   Temp:  100.4 F (38 C) 98.8 F (37.1 C)   TempSrc:  Oral Oral   Resp:      Height:      Weight:      SpO2: 92%  99%    No intake or output data in the 24 hours ending 11/14/12 0722 Filed Weights   11/13/12 2351  Weight: 129.275 kg (285 lb)    Exam:   General:  No distress.  Cardiovascular: S 1, S 2 RRR  Respiratory: CTA  Abdomen: BS decreases, soft, generalized tenderness, worse left lower quadrant,   Musculoskeletal: no deformity.   Data Reviewed: Basic Metabolic Panel:  Recent Labs Lab 11/12/12 2352 11/13/12 2356 11/14/12 0509  NA 135 135 134*  K 3.4* 3.4* 3.2*  CL 95* 96 98  CO2 27 26 23   GLUCOSE 102* 108* 109*  BUN 7 7 6   CREATININE 0.80 0.77 0.78  CALCIUM 10.2 9.8 9.0   Liver Function Tests:  Recent Labs Lab 11/12/12 2352  AST 30  ALT 65*  ALKPHOS 73  BILITOT 0.4  PROT 8.1  ALBUMIN 4.3   No results found for this  basename: LIPASE, AMYLASE,  in the last 168 hours No results found for this basename: AMMONIA,  in the last 168 hours CBC:  Recent Labs Lab 11/12/12 2352 11/13/12 0241 11/13/12 2356 11/14/12 0509  WBC 17.0* 19.7* 19.8* 21.2*  NEUTROABS 13.8*  --  17.4*  --   HGB 18.0* 18.4* 17.8* 16.5  HCT 49.3 49.9 49.2 45.3  MCV 88.8 88.3 89.1 90.1  PLT 278 311 259 237   Cardiac Enzymes: No results found for this basename: CKTOTAL, CKMB, CKMBINDEX, TROPONINI,  in the last 168 hours BNP (last 3 results) No results found for this basename: PROBNP,  in the last 8760 hours CBG: No results found for this basename: GLUCAP,  in the last 168 hours  No results found for this or any previous visit (from the past 240 hour(s)).   Studies: Ct Abdomen Pelvis W Contrast  11/14/2012  *RADIOLOGY REPORT*  Clinical Data: Abdominal pain; rectal bleeding.  Nausea, vomiting and diarrhea.  CT ABDOMEN AND PELVIS WITH CONTRAST  Technique:  Multidetector CT imaging of the abdomen and pelvis was performed following the standard protocol during bolus administration of intravenous contrast.  Contrast: OMNIPAQUE IOHEXOL 300 MG/ML  SOLN  Comparison: CT of the abdomen and pelvis performed 07/04/2009  Findings: The visualized lung bases are clear.  The liver and spleen are unremarkable in appearance.  The gallbladder  is within normal limits.  The pancreas and adrenal glands are unremarkable.  Minimal nonspecific perinephric stranding is noted bilaterally.  An apparent tiny right renal cyst is stable from 2010 and likely benign.  No hydronephrosis is seen.  No renal or ureteral stones are identified.  The small bowel is unremarkable in appearance.  The stomach is within normal limits.  No acute vascular abnormalities are seen. Prominent mesenteric nodes may reflect the patient's pelvic process.  The patient is status post appendectomy.  There is marked soft tissue inflammation along the proximal to mid sigmoid colon, with  significant wall thickening and several inflamed diverticula.  There is a collection of fluid and air noted along the anterior inferior aspect of the sigmoid colon, measuring 3.7 x 2.7 cm, which could reflect a focal microperforation or possibly a wall abscess.  No significant free air or free fluid is noted within the remainder of the abdomen or pelvis to suggest frank perforation.  Extensive soft tissue inflammation and a small amount of fluid are seen tracking about the pelvis, with marked associated soft tissue inflammation along the bladder and associated bladder wall thickening.  The prostate remains normal in size.  No inguinal lymphadenopathy is seen.  No acute osseous abnormalities are identified.  Mild degenerative change is noted at the right hip joint.  IMPRESSION:  1.  Severe acute diverticulitis involving the proximal to mid sigmoid colon, with marked soft tissue inflammation and significant wall thickening.  Collection of fluid and air along the anterior inferior aspect of the sigmoid colon, measuring 3.7 x 2.7 cm, could reflect a focal microperforation or possibly a wall abscess.  No significant free air or free fluid seen within the remainder of the abdomen or pelvis to suggest frank perforation.  Extensive soft tissue inflammation and a small amount of fluid noted tracking about the pelvis, with marked soft tissue inflammation along the bladder and associated bladder wall thickening. 2.  Prominent mesenteric nodes may reflect the colonic process. 3.  Stable likely tiny right renal cyst noted. 4.  Mild degenerative change at the right hip joint.  These results were called by telephone on 11/14/2012 at 01:55 a.m. to Dr. [name], who verbally acknowledged these results.   Original Report Authenticated By: Tonia Ghent, M.D.     Scheduled Meds: . ciprofloxacin  400 mg Intravenous Q12H  . metronidazole  500 mg Intravenous Q8H  . pantoprazole  40 mg Oral Daily   Continuous Infusions: .  sodium chloride 100 mL/hr at 11/14/12 0419    Principal Problem:   Diverticulitis of large intestine Active Problems:   HTN (hypertension)    Time spent: 25 minutes.     Preston [name]  [facility] Pager [phone]. If 7PM-7AM, please contact night-coverage at [email or web address], password [facility] 11/14/2012, 7:22 AM  LOS: 1 day

## 2012-11-14 NOTE — ED Notes (Signed)
Hospitalist aware of pain and fever.

## 2012-11-14 NOTE — ED Notes (Signed)
CT is aware the patient finished his first cup of contrast.

## 2012-11-14 NOTE — Consult Note (Signed)
General surgery attending note:  I personally interviewed and examined this patient and reviewed the imaging studies. I agree with the assessment and treatment plan outlined by Ms. Delford Field, PA.  This patient is alert, stable, appropriate, with normal mental status.  He has significant leukocytosis and suprapubic tenderness, but clinically and radiographically there is no evidence of toxicity,diffuse peritonitis, free air, free fluid, bleeding, or obstruction. He has a phlegmon around the sigmoid colon possibly has a small intramural abscess or microperforation.   Plan: Bowel rest Cipro and Flagyl IV We'll follow closely. Decisions regarding surgical management will depend on his response to medical therapy.    Angelia Mould. Derrell Lolling, M.D., [facility] General and Minimally invasive Surgery Breast and Colorectal Surgery Office:   [phone] Pager:   [phone]

## 2012-11-15 DIAGNOSIS — D72829 Elevated white blood cell count, unspecified: Secondary | ICD-10-CM

## 2012-11-15 LAB — BASIC METABOLIC PANEL
CO2: 25 mEq/L (ref 19–32)
Calcium: 8.7 mg/dL (ref 8.4–10.5)
Chloride: 97 mEq/L (ref 96–112)
Creatinine, Ser: 0.79 mg/dL (ref 0.50–1.35)
Glucose, Bld: 115 mg/dL — ABNORMAL HIGH (ref 70–99)

## 2012-11-15 LAB — CBC
HCT: 42.3 % (ref 39.0–52.0)
Hemoglobin: 14.5 g/dL (ref 13.0–17.0)
MCH: 31.1 pg (ref 26.0–34.0)
MCV: 90.8 fL (ref 78.0–100.0)
RBC: 4.66 MIL/uL (ref 4.22–5.81)
WBC: 18.4 10*3/uL — ABNORMAL HIGH (ref 4.0–10.5)

## 2012-11-15 MED ORDER — NICOTINE 21 MG/24HR TD PT24
21.0000 mg | MEDICATED_PATCH | Freq: Every day | TRANSDERMAL | Status: DC
  Administered 2012-11-15 – 2012-11-18 (×4): 21 mg via TRANSDERMAL
  Filled 2012-11-15 (×4): qty 1

## 2012-11-15 MED ORDER — MAGNESIUM SULFATE 40 MG/ML IJ SOLN
2.0000 g | Freq: Once | INTRAMUSCULAR | Status: AC
  Administered 2012-11-15: 2 g via INTRAVENOUS
  Filled 2012-11-15: qty 50

## 2012-11-15 MED ORDER — HEPARIN SODIUM (PORCINE) 5000 UNIT/ML IJ SOLN
5000.0000 [IU] | Freq: Three times a day (TID) | INTRAMUSCULAR | Status: DC
  Administered 2012-11-15 – 2012-11-18 (×9): 5000 [IU] via SUBCUTANEOUS
  Filled 2012-11-15 (×12): qty 1

## 2012-11-15 MED ORDER — POTASSIUM CHLORIDE 10 MEQ/100ML IV SOLN
10.0000 meq | INTRAVENOUS | Status: AC
  Administered 2012-11-15: 10 meq via INTRAVENOUS
  Filled 2012-11-15: qty 400

## 2012-11-15 MED ORDER — POTASSIUM CHLORIDE 10 MEQ/100ML IV SOLN
10.0000 meq | INTRAVENOUS | Status: AC
  Administered 2012-11-15 – 2012-11-16 (×3): 10 meq via INTRAVENOUS

## 2012-11-15 NOTE — Progress Notes (Signed)
Subjective: Says he feels a little better. The pain is less. Still has pain. Voiding easily. No tissue area or pneumaturia reported. Passing flatus, no stool. No vomiting.  Maximum temperature, 100.1.  Objective: Vital signs in last 24 hours: Temp:  [98.1 F (36.7 C)-100.1 F (37.8 C)] 98.5 F (36.9 C) (04/19 0509) Pulse Rate:  [98-102] 98 (04/19 0509) Resp:  [15-18] 15 (04/19 0509) BP: (137-150)/(84-98) 137/91 mmHg (04/19 0509) SpO2:  [96 %-97 %] 96 % (04/19 0509) Last BM Date: 11/13/12  Intake/Output from previous day: 04/18 0701 - 04/19 0700 In: 2564.7 [I.V.:2564.7] Out: 975 [Urine:975] Intake/Output this shift: Total I/O In: 1843 [I.V.:1843] Out: 275 [Urine:275]  General appearance: And alert. Friendly. Mental status normal. Does not appear ill or toxic. Resp: Clear to auscultation bilaterally GI: Abdomen soft, slightly distended. Hypoactive bowel sounds. Tender with guarding suprapubic area.  Lab Results:   Recent Labs  11/13/12 2356 11/14/12 0509  WBC 19.8* 21.2*  HGB 17.8* 16.5  HCT 49.2 45.3  PLT 259 237   BMET  Recent Labs  11/13/12 2356 11/14/12 0509  NA 135 134*  K 3.4* 3.2*  CL 96 98  CO2 26 23  GLUCOSE 108* 109*  BUN 7 6  CREATININE 0.77 0.78  CALCIUM 9.8 9.0   PT/INR  Recent Labs  11/13/12 0241  LABPROT 13.3  INR 1.02   ABG No results found for this basename: PHART, PCO2, PO2, HCO3,  in the last 72 hours  Studies/Results: Ct Abdomen Pelvis W Contrast  11/14/2012  *RADIOLOGY REPORT*  Clinical Data: Abdominal pain; rectal bleeding.  Nausea, vomiting and diarrhea.  CT ABDOMEN AND PELVIS WITH CONTRAST  Technique:  Multidetector CT imaging of the abdomen and pelvis was performed following the standard protocol during bolus administration of intravenous contrast.  Contrast: OMNIPAQUE IOHEXOL 300 MG/ML  SOLN  Comparison: CT of the abdomen and pelvis performed 07/04/2009  Findings: The visualized lung bases are clear.  The liver and  spleen are unremarkable in appearance.  The gallbladder is within normal limits.  The pancreas and adrenal glands are unremarkable.  Minimal nonspecific perinephric stranding is noted bilaterally.  An apparent tiny right renal cyst is stable from 2010 and likely benign.  No hydronephrosis is seen.  No renal or ureteral stones are identified.  The small bowel is unremarkable in appearance.  The stomach is within normal limits.  No acute vascular abnormalities are seen. Prominent mesenteric nodes may reflect the patient's pelvic process.  The patient is status post appendectomy.  There is marked soft tissue inflammation along the proximal to mid sigmoid colon, with significant wall thickening and several inflamed diverticula.  There is a collection of fluid and air noted along the anterior inferior aspect of the sigmoid colon, measuring 3.7 x 2.7 cm, which could reflect a focal microperforation or possibly a wall abscess.  No significant free air or free fluid is noted within the remainder of the abdomen or pelvis to suggest frank perforation.  Extensive soft tissue inflammation and a small amount of fluid are seen tracking about the pelvis, with marked associated soft tissue inflammation along the bladder and associated bladder wall thickening.  The prostate remains normal in size.  No inguinal lymphadenopathy is seen.  No acute osseous abnormalities are identified.  Mild degenerative change is noted at the right hip joint.  IMPRESSION:  1.  Severe acute diverticulitis involving the proximal to mid sigmoid colon, with marked soft tissue inflammation and significant wall thickening.  Collection of fluid  and air along the anterior inferior aspect of the sigmoid colon, measuring 3.7 x 2.7 cm, could reflect a focal microperforation or possibly a wall abscess.  No significant free air or free fluid seen within the remainder of the abdomen or pelvis to suggest frank perforation.  Extensive soft tissue inflammation and a  small amount of fluid noted tracking about the pelvis, with marked soft tissue inflammation along the bladder and associated bladder wall thickening. 2.  Prominent mesenteric nodes may reflect the colonic process. 3.  Stable likely tiny right renal cyst noted. 4.  Mild degenerative change at the right hip joint.  These results were called by telephone on 11/14/2012 at 01:55 a.m. to Dr. [name], who verbally acknowledged these results.   Original Report Authenticated By: Tonia Ghent, M.D.     Anti-infectives: Anti-infectives   Start     Dose/Rate Route Frequency Ordered Stop   11/14/12 1430  ciprofloxacin (CIPRO) IVPB 400 mg     400 mg 200 mL/hr over 60 Minutes Intravenous Every 12 hours 11/14/12 0409     11/14/12 1030  metroNIDAZOLE (FLAGYL) IVPB 500 mg     500 mg 100 mL/hr over 60 Minutes Intravenous Every 8 hours 11/14/12 0409     11/14/12 0230  ciprofloxacin (CIPRO) IVPB 400 mg     400 mg 200 mL/hr over 60 Minutes Intravenous  Once 11/14/12 0224 11/14/12 0436   11/14/12 0230  metroNIDAZOLE (FLAGYL) IVPB 500 mg     500 mg 100 mL/hr over 60 Minutes Intravenous  Once 11/14/12 0224 11/14/12 0539      Assessment/Plan:  Acute sigmoid diverticulitis. Suspect microperforation and possible contained intramural abscess. Stable on IV antibiotics. No indication for surgical intervention at this point. Continue antibiotics Would continue bowel rest, n.p.o. Status for the next 24 hours. Check labs Ambulate more He may or may not require surgical intervention this hospitalization, depending on response to medical therapy.   LOS: 2 days    Loletta Harper M. Derrell Lolling, M.D., [facility] General and Minimally invasive Surgery Breast and Colorectal Surgery Office:   [phone] Pager:   [phone]  11/15/2012

## 2012-11-15 NOTE — Progress Notes (Addendum)
[facility] PROGRESS NOTE  Preston [name] MVH:[id number] DOB: February 23, 1971 DOA: 11/13/2012 PCP: No PCP Per Patient  Assessment/Plan: 1-Diverticulitis with possible wall  Abscess vs  microperforation:  -Continue with ciprofloxacin and flagyl day 2. -WBC decrease to 18. WBC  Peak to 20.  -increase IV fluids, electrolytes replacement.  -Surgery assistance appreciated. -Continue with medical management.   2-GI bleed: no more episode, possible from diverticulitis. Monitor hb. Hb at 14. Hb on admission at 17, Probably hemoconcentration secondary to dehydration.  3-DVT prophylaxis: SCD, avoid anticoagulation due to risk for bleed. Heparin started by surgery. 4-Hypokalemia: replace with IV kcl 4 runs.  mg level at 1.4. Getting 2 gr IV Mg.    Code Status: Full Family Communication: Care discussed with patient.  Disposition Plan: to be determine   Consultants:  Surgery  Procedures:  none  Antibiotics:  Ciprofloxacin 4-18  Flagyl 4-18  HPI/Subjective: Still complaining of abdominal pain. Pain 5/10.  No BM. No blood in the stool. Passing gas.  No nausea.   Objective: Filed Vitals:   11/14/12 0815 11/14/12 1720 11/14/12 2203 11/15/12 0509  BP: 150/98 148/97 141/84 137/91  Pulse: 102 101 101 98  Temp: 100.1 F (37.8 C) 98.1 F (36.7 C) 98.1 F (36.7 C) 98.5 F (36.9 C)  TempSrc: Oral Oral Oral Oral  Resp: 18  16 15   Height:      Weight:      SpO2: 97% 96% 97% 96%    Intake/Output Summary (Last 24 hours) at 11/15/12 1121 Last data filed at 11/15/12 0801  Gross per 24 hour  Intake 2564.67 ml  Output    700 ml  Net 1864.67 ml   Filed Weights   11/13/12 2351  Weight: 129.275 kg (285 lb)    Exam:   General:  No distress.  Cardiovascular: S 1, S 2 RRR  Respiratory: CTA  Abdomen: BS decreases, soft, generalized tenderness, worse left lower quadrant,   Musculoskeletal: no deformity.   Data Reviewed: Basic Metabolic Panel:  Recent Labs Lab  11/12/12 2352 11/13/12 2356 11/14/12 0509 11/15/12 0500  NA 135 135 134* 135  K 3.4* 3.4* 3.2* 3.3*  CL 95* 96 98 97  CO2 27 26 23 25   GLUCOSE 102* 108* 109* 115*  BUN 7 7 6 6   CREATININE 0.80 0.77 0.78 0.79  CALCIUM 10.2 9.8 9.0 8.7  MG  --   --   --  1.4*   Liver Function Tests:  Recent Labs Lab 11/12/12 2352  AST 30  ALT 65*  ALKPHOS 73  BILITOT 0.4  PROT 8.1  ALBUMIN 4.3   No results found for this basename: LIPASE, AMYLASE,  in the last 168 hours No results found for this basename: AMMONIA,  in the last 168 hours CBC:  Recent Labs Lab 11/12/12 2352 11/13/12 0241 11/13/12 2356 11/14/12 0509 11/15/12 0500  WBC 17.0* 19.7* 19.8* 21.2* 18.4*  NEUTROABS 13.8*  --  17.4*  --   --   HGB 18.0* 18.4* 17.8* 16.5 14.5  HCT 49.3 49.9 49.2 45.3 42.3  MCV 88.8 88.3 89.1 90.1 90.8  PLT 278 311 259 237 245   Cardiac Enzymes: No results found for this basename: CKTOTAL, CKMB, CKMBINDEX, TROPONINI,  in the last 168 hours BNP (last 3 results) No results found for this basename: PROBNP,  in the last 8760 hours CBG: No results found for this basename: GLUCAP,  in the last 168 hours  No results found for this or any previous visit (from the  past 240 hour(s)).   Studies: Ct Abdomen Pelvis W Contrast  11/14/2012  *RADIOLOGY REPORT*  Clinical Data: Abdominal pain; rectal bleeding.  Nausea, vomiting and diarrhea.  CT ABDOMEN AND PELVIS WITH CONTRAST  Technique:  Multidetector CT imaging of the abdomen and pelvis was performed following the standard protocol during bolus administration of intravenous contrast.  Contrast: OMNIPAQUE IOHEXOL 300 MG/ML  SOLN  Comparison: CT of the abdomen and pelvis performed 07/04/2009  Findings: The visualized lung bases are clear.  The liver and spleen are unremarkable in appearance.  The gallbladder is within normal limits.  The pancreas and adrenal glands are unremarkable.  Minimal nonspecific perinephric stranding is noted bilaterally.  An  apparent tiny right renal cyst is stable from 2010 and likely benign.  No hydronephrosis is seen.  No renal or ureteral stones are identified.  The small bowel is unremarkable in appearance.  The stomach is within normal limits.  No acute vascular abnormalities are seen. Prominent mesenteric nodes may reflect the patient's pelvic process.  The patient is status post appendectomy.  There is marked soft tissue inflammation along the proximal to mid sigmoid colon, with significant wall thickening and several inflamed diverticula.  There is a collection of fluid and air noted along the anterior inferior aspect of the sigmoid colon, measuring 3.7 x 2.7 cm, which could reflect a focal microperforation or possibly a wall abscess.  No significant free air or free fluid is noted within the remainder of the abdomen or pelvis to suggest frank perforation.  Extensive soft tissue inflammation and a small amount of fluid are seen tracking about the pelvis, with marked associated soft tissue inflammation along the bladder and associated bladder wall thickening.  The prostate remains normal in size.  No inguinal lymphadenopathy is seen.  No acute osseous abnormalities are identified.  Mild degenerative change is noted at the right hip joint.  IMPRESSION:  1.  Severe acute diverticulitis involving the proximal to mid sigmoid colon, with marked soft tissue inflammation and significant wall thickening.  Collection of fluid and air along the anterior inferior aspect of the sigmoid colon, measuring 3.7 x 2.7 cm, could reflect a focal microperforation or possibly a wall abscess.  No significant free air or free fluid seen within the remainder of the abdomen or pelvis to suggest frank perforation.  Extensive soft tissue inflammation and a small amount of fluid noted tracking about the pelvis, with marked soft tissue inflammation along the bladder and associated bladder wall thickening. 2.  Prominent mesenteric nodes may reflect the  colonic process. 3.  Stable likely tiny right renal cyst noted. 4.  Mild degenerative change at the right hip joint.  These results were called by telephone on 11/14/2012 at 01:55 a.m. to Dr. [name], who verbally acknowledged these results.   Original Report Authenticated By: Tonia Ghent, M.D.     Scheduled Meds: . antiseptic oral rinse  15 mL Mouth Rinse q12n4p  . chlorhexidine  15 mL Mouth Rinse BID  . ciprofloxacin  400 mg Intravenous Q12H  . heparin subcutaneous  5,000 Units Subcutaneous Q8H  . metronidazole  500 mg Intravenous Q8H  . pantoprazole (PROTONIX) IV  40 mg Intravenous Q12H  . potassium chloride  10 mEq Intravenous Q1 Hr x 4   Continuous Infusions: . dextrose 5 % and 0.45 % NaCl with KCl 20 mEq/L 100 mL/hr at 11/15/12 4782    Principal Problem:   Diverticulitis of large intestine Active Problems:   HTN (hypertension)  Time spent: 25 minutes.     Preston [name]  [facility] Pager [phone]. If 7PM-7AM, please contact night-coverage at [email or web address], password [facility] 11/15/2012, 11:21 AM  LOS: 2 days

## 2012-11-16 DIAGNOSIS — D72829 Elevated white blood cell count, unspecified: Secondary | ICD-10-CM

## 2012-11-16 LAB — BASIC METABOLIC PANEL
BUN: 5 mg/dL — ABNORMAL LOW (ref 6–23)
Chloride: 98 mEq/L (ref 96–112)
Creatinine, Ser: 0.71 mg/dL (ref 0.50–1.35)
GFR calc Af Amer: >90 mL/min (ref >90)
Glucose, Bld: 106 mg/dL — ABNORMAL HIGH (ref 70–99)
Potassium: 3.5 mEq/L (ref 3.5–5.1)

## 2012-11-16 LAB — CBC
HCT: 38.4 % — ABNORMAL LOW (ref 39.0–52.0)
Hemoglobin: 13.4 g/dL (ref 13.0–17.0)
MCHC: 34.9 g/dL (ref 30.0–36.0)
MCV: 91.2 fL (ref 78.0–100.0)
RDW: 12.9 % (ref 11.5–15.5)
WBC: 11.6 10*3/uL — ABNORMAL HIGH (ref 4.0–10.5)

## 2012-11-16 NOTE — Progress Notes (Signed)
[facility] PROGRESS NOTE  Zigmund Linse Mcveigh OZH:[id number] DOB: [birth year] DOA: 11/13/2012 PCP: No PCP Per Patient  Assessment/Plan: 1-Diverticulitis with possible wall  Abscess vs  microperforation:  -Continue with ciprofloxacin and flagyl day 3. -WBC decrease to 11. WBC  Peak to 20.  -Continue with IV fluids, electrolytes replacement.  -Surgery assistance appreciated. -Continue with medical management.   2-GI bleed: no more episode, possible from diverticulitis. Monitor hb. Hb at 14. Hb on admission at 17, Probably hemoconcentration secondary to dehydration.  3-DVT prophylaxis: SCD, avoid anticoagulation due to risk for bleed. Heparin started by surgery. 4-Hypokalemia: K at 3.5. KCL with IV fluids.    Code Status: Full Family Communication: Care discussed with patient.  Disposition Plan: to be determine   Consultants:  Surgery  Procedures:  none  Antibiotics:  Ciprofloxacin 4-18  Flagyl 4-18  HPI/Subjective: Abdominal pain better, pain 5/10. Passing gas.   Objective: Filed Vitals:   11/15/12 0509 11/15/12 1300 11/15/12 2131 11/16/12 0535  BP: 137/91 138/89 138/88 132/100  Pulse: 98 89 97 97  Temp: 98.5 F (36.9 C) 98.8 F (37.1 C) 99.4 F (37.4 C) 98.8 F (37.1 C)  TempSrc: Oral Oral Oral Oral  Resp: 15 16 18 18   Height:      Weight:      SpO2: 96% 97% 97% 98%    Intake/Output Summary (Last 24 hours) at 11/16/12 1030 Last data filed at 11/16/12 0516  Gross per 24 hour  Intake 1977.5 ml  Output   1225 ml  Net  752.5 ml   Filed Weights   11/13/12 2351  Weight: 129.275 kg (285 lb)    Exam:   General:  No distress.  Cardiovascular: S 1, S 2 RRR  Respiratory: CTA  Abdomen: BS decreases, soft, generalized tenderness, mild tenderness left lower quadrant,   Musculoskeletal: no deformity.   Data Reviewed: Basic Metabolic Panel:  Recent Labs Lab 11/12/12 2352 11/13/12 2356 11/14/12 0509 11/15/12 0500 11/16/12 0610  NA 135 135  134* 135 133*  K 3.4* 3.4* 3.2* 3.3* 3.5  CL 95* 96 98 97 98  CO2 27 26 23 25 25   GLUCOSE 102* 108* 109* 115* 106*  BUN 7 7 6 6  5*  CREATININE 0.80 0.77 0.78 0.79 0.71  CALCIUM 10.2 9.8 9.0 8.7 8.3*  MG  --   --   --  1.4* 1.9   Liver Function Tests:  Recent Labs Lab 11/12/12 2352  AST 30  ALT 65*  ALKPHOS 73  BILITOT 0.4  PROT 8.1  ALBUMIN 4.3   No results found for this basename: LIPASE, AMYLASE,  in the last 168 hours No results found for this basename: AMMONIA,  in the last 168 hours CBC:  Recent Labs Lab 11/12/12 2352 11/13/12 0241 11/13/12 2356 11/14/12 0509 11/15/12 0500 11/16/12 0610  WBC 17.0* 19.7* 19.8* 21.2* 18.4* 11.6*  NEUTROABS 13.8*  --  17.4*  --   --   --   HGB 18.0* 18.4* 17.8* 16.5 14.5 13.4  HCT 49.3 49.9 49.2 45.3 42.3 38.4*  MCV 88.8 88.3 89.1 90.1 90.8 91.2  PLT 278 311 259 237 245 253   Cardiac Enzymes: No results found for this basename: CKTOTAL, CKMB, CKMBINDEX, TROPONINI,  in the last 168 hours BNP (last 3 results) No results found for this basename: PROBNP,  in the last 8760 hours CBG: No results found for this basename: GLUCAP,  in the last 168 hours  Recent Results (from the past 240 hour(s))  CULTURE, BLOOD (  ROUTINE X 2)     Status: None   Collection Time    11/14/12  3:40 AM      Result Value Range Status   Specimen Description BLOOD LEFT ARM   Final   Special Requests BOTTLES DRAWN AEROBIC AND ANAEROBIC 10CC   Final   Culture  Setup Time 11/14/2012 08:33   Final   Culture     Final   Value:        BLOOD CULTURE RECEIVED NO GROWTH TO DATE CULTURE WILL BE HELD FOR 5 DAYS BEFORE ISSUING A FINAL NEGATIVE REPORT   Report Status PENDING   Incomplete  CULTURE, BLOOD (ROUTINE X 2)     Status: None   Collection Time    11/14/12  3:45 AM      Result Value Range Status   Specimen Description BLOOD LEFT HAND   Final   Special Requests BOTTLES DRAWN AEROBIC AND ANAEROBIC 10CC   Final   Culture  Setup Time 11/14/2012 08:33   Final    Culture     Final   Value:        BLOOD CULTURE RECEIVED NO GROWTH TO DATE CULTURE WILL BE HELD FOR 5 DAYS BEFORE ISSUING A FINAL NEGATIVE REPORT   Report Status PENDING   Incomplete     Studies: No results found.  Scheduled Meds: . antiseptic oral rinse  15 mL Mouth Rinse q12n4p  . chlorhexidine  15 mL Mouth Rinse BID  . ciprofloxacin  400 mg Intravenous Q12H  . heparin subcutaneous  5,000 Units Subcutaneous Q8H  . metronidazole  500 mg Intravenous Q8H  . nicotine  21 mg Transdermal Daily  . pantoprazole (PROTONIX) IV  40 mg Intravenous Q12H   Continuous Infusions: . dextrose 5 % and 0.45 % NaCl with KCl 20 mEq/L 125 mL/hr at 11/16/12 1610    Principal Problem:   Diverticulitis of large intestine Active Problems:   HTN (hypertension)   Leukocytosis, unspecified    Time spent: 25 minutes.     Preston [name]  [facility] Pager [phone]. If 7PM-7AM, please contact night-coverage at [email or web address], password [facility] 11/16/2012, 10:30 AM  LOS: 3 days

## 2012-11-16 NOTE — Progress Notes (Addendum)
Patient ID: Preston [name], male   DOB: 1971/06/23, 42 y.o.   MRN: [id number]    Subjective: Continues to feel better, has only min pain in suprapubic area at rest, denies n/v, feels hungry  Objective: Vital signs in last 24 hours: Temp:  [98.8 F (37.1 C)-99.4 F (37.4 C)] 98.8 F (37.1 C) (04/20 0535) Pulse Rate:  [89-97] 97 (04/20 0535) Resp:  [16-18] 18 (04/20 0535) BP: (132-138)/(88-100) 132/100 mmHg (04/20 0535) SpO2:  [97 %-98 %] 98 % (04/20 0535) Last BM Date: 11/13/12  Intake/Output from previous day: 04/19 0701 - 04/20 0700 In: 1977.5 [I.V.:1977.5] Out: 1425 [Urine:1425] Intake/Output this shift:    General appearance: And alert. Friendly. Mental status normal. Does not appear ill or toxic. Resp: Clear to auscultation bilaterally GI: Abdomen soft, slightly distended. active bowel sounds. Min tenderness in suprapubic area, no guarding.  Lab Results:   Recent Labs  11/15/12 0500 11/16/12 0610  WBC 18.4* 11.6*  HGB 14.5 13.4  HCT 42.3 38.4*  PLT 245 253   BMET  Recent Labs  11/15/12 0500 11/16/12 0610  NA 135 133*  K 3.3* 3.5  CL 97 98  CO2 25 25  GLUCOSE 115* 106*  BUN 6 5*  CREATININE 0.79 0.71  CALCIUM 8.7 8.3*   PT/INR No results found for this basename: LABPROT, INR,  in the last 72 hours ABG No results found for this basename: PHART, PCO2, PO2, HCO3,  in the last 72 hours  Studies/Results: No results found.  Anti-infectives: Anti-infectives   Start     Dose/Rate Route Frequency Ordered Stop   11/14/12 1430  ciprofloxacin (CIPRO) IVPB 400 mg     400 mg 200 mL/hr over 60 Minutes Intravenous Every 12 hours 11/14/12 0409     11/14/12 1030  metroNIDAZOLE (FLAGYL) IVPB 500 mg     500 mg 100 mL/hr over 60 Minutes Intravenous Every 8 hours 11/14/12 0409     11/14/12 0230  ciprofloxacin (CIPRO) IVPB 400 mg     400 mg 200 mL/hr over 60 Minutes Intravenous  Once 11/14/12 0224 11/14/12 0436   11/14/12 0230  metroNIDAZOLE (FLAGYL) IVPB 500 mg      500 mg 100 mL/hr over 60 Minutes Intravenous  Once 11/14/12 0224 11/14/12 0539      Assessment/Plan:  Acute sigmoid diverticulitis. Suspect microperforation and possible contained intramural abscess. Stable on IV antibiotics, seems to be improving No indication for surgical intervention at this point. Continue antibiotics Start Clear liquid diet today. Check labs, WBC down to 11 Ambulate more He may or may not require surgical intervention this hospitalization, depending on response to medical therapy.   LOS: 3 days    [facility] Office:   [phone]   11/16/2012  General surgery attending note:  I have a interviewed and examined this patient this morning. I agree with the assessment and treatment plan outlined by Ms. White, [place]. His pain and abdominal tenderness are slowly improving, Although he is still tender.  He is passing flatus but no bowel movement yet.  Will keep him a liquid diet until he begins having bowel movements. Consider CT scan prior to discharge to make sure he is not developing an abscess.   Angelia Mould. Derrell Lolling, M.D., [facility] General and Minimally invasive Surgery Breast and Colorectal Surgery Office:   [phone] Pager:   [phone]

## 2012-11-17 ENCOUNTER — Encounter (HOSPITAL_COMMUNITY): Payer: Self-pay

## 2012-11-17 LAB — CBC
MCHC: 34 g/dL (ref 30.0–36.0)
MCV: 89.7 fL (ref 78.0–100.0)
Platelets: 297 10*3/uL (ref 150–400)
RDW: 13 % (ref 11.5–15.5)
WBC: 7.5 10*3/uL (ref 4.0–10.5)

## 2012-11-17 LAB — BASIC METABOLIC PANEL
Chloride: 97 mEq/L (ref 96–112)
Creatinine, Ser: 0.68 mg/dL (ref 0.50–1.35)
GFR calc Af Amer: >90 mL/min (ref >90)
GFR calc non Af Amer: >90 mL/min (ref >90)
Potassium: 3.4 mEq/L — ABNORMAL LOW (ref 3.5–5.1)

## 2012-11-17 MED ORDER — POTASSIUM CHLORIDE CRYS ER 20 MEQ PO TBCR
20.0000 meq | EXTENDED_RELEASE_TABLET | Freq: Two times a day (BID) | ORAL | Status: AC
  Administered 2012-11-17 (×2): 20 meq via ORAL
  Filled 2012-11-17: qty 1

## 2012-11-17 MED ORDER — HYDROCODONE-ACETAMINOPHEN 10-325 MG PO TABS
1.0000 | ORAL_TABLET | ORAL | Status: DC | PRN
  Administered 2012-11-17 – 2012-11-18 (×5): 1 via ORAL
  Filled 2012-11-17 (×5): qty 1

## 2012-11-17 NOTE — Progress Notes (Signed)
[facility] PROGRESS NOTE  Preston [name] UYQ:[id number] DOB: 1971/01/17 DOA: 11/13/2012 PCP: No PCP Per Patient  Assessment/Plan: 1-Diverticulitis with possible wall  Abscess vs  microperforation:  -Continue with ciprofloxacin and flagyl day 4. -WBC decrease to 7. WBC  Peak to 20.  -Continue with IV fluids, electrolytes replacement.  -Surgery assistance appreciated. -Continue with medical management.  -Diet advanced to full liquid.   2-GI bleed: no more episode, possible from diverticulitis. Monitor hb. Hb at 14. Hb on admission at 17, Probably hemoconcentration secondary to dehydration.  3-DVT prophylaxis: SCD, avoid anticoagulation due to risk for bleed. Heparin started by surgery. 4-Hypokalemia:. KCL with IV fluids. Start Kcl 20 meq po BID.    Code Status: Full Family Communication: Care discussed with patient.  Disposition Plan: to be determine   Consultants:  Surgery  Procedures:  none  Antibiotics:  Ciprofloxacin 4-18  Flagyl 4-18  HPI/Subjective: Abdominal pain better. Tolerating clear diet. Had 2 BM.   Objective: Filed Vitals:   11/16/12 0535 11/16/12 1328 11/16/12 2100 11/17/12 0607  BP: 132/100 135/83 133/96 145/97  Pulse: 97 92 80 72  Temp: 98.8 F (37.1 C) 98.6 F (37 C) 98.7 F (37.1 C) 98 F (36.7 C)  TempSrc: Oral Oral Oral Oral  Resp: 18 16 18 18   Height:      Weight:      SpO2: 98% 99% 99% 95%    Intake/Output Summary (Last 24 hours) at 11/17/12 1121 Last data filed at 11/17/12 6387  Gross per 24 hour  Intake 4697.92 ml  Output   1751 ml  Net 2946.92 ml   Filed Weights   11/13/12 2351  Weight: 129.275 kg (285 lb)    Exam:   General:  No distress.  Cardiovascular: S 1, S 2 RRR  Respiratory: CTA  Abdomen: BS decreases, soft, generalized tenderness, mild tenderness left lower quadrant,   Musculoskeletal: no deformity.   Data Reviewed: Basic Metabolic Panel:  Recent Labs Lab 11/13/12 2356 11/14/12 0509  11/15/12 0500 11/16/12 0610 11/17/12 0547  NA 135 134* 135 133* 133*  K 3.4* 3.2* 3.3* 3.5 3.4*  CL 96 98 97 98 97  CO2 26 23 25 25 26   GLUCOSE 108* 109* 115* 106* 132*  BUN 7 6 6  5* 3*  CREATININE 0.77 0.78 0.79 0.71 0.68  CALCIUM 9.8 9.0 8.7 8.3* 9.0  MG  --   --  1.4* 1.9  --    Liver Function Tests:  Recent Labs Lab 11/12/12 2352  AST 30  ALT 65*  ALKPHOS 73  BILITOT 0.4  PROT 8.1  ALBUMIN 4.3   No results found for this basename: LIPASE, AMYLASE,  in the last 168 hours No results found for this basename: AMMONIA,  in the last 168 hours CBC:  Recent Labs Lab 11/12/12 2352  11/13/12 2356 11/14/12 0509 11/15/12 0500 11/16/12 0610 11/17/12 0547  WBC 17.0*  < > 19.8* 21.2* 18.4* 11.6* 7.5  NEUTROABS 13.8*  --  17.4*  --   --   --   --   HGB 18.0*  < > 17.8* 16.5 14.5 13.4 12.7*  HCT 49.3  < > 49.2 45.3 42.3 38.4* 37.3*  MCV 88.8  < > 89.1 90.1 90.8 91.2 89.7  PLT 278  < > 259 237 245 253 297  < > = values in this interval not displayed. Cardiac Enzymes: No results found for this basename: CKTOTAL, CKMB, CKMBINDEX, TROPONINI,  in the last 168 hours BNP (last 3 results) No  results found for this basename: PROBNP,  in the last 8760 hours CBG: No results found for this basename: GLUCAP,  in the last 168 hours  Recent Results (from the past 240 hour(s))  CULTURE, BLOOD (ROUTINE X 2)     Status: None   Collection Time    11/14/12  3:40 AM      Result Value Range Status   Specimen Description BLOOD LEFT ARM   Final   Special Requests BOTTLES DRAWN AEROBIC AND ANAEROBIC 10CC   Final   Culture  Setup Time 11/14/2012 08:33   Final   Culture     Final   Value:        BLOOD CULTURE RECEIVED NO GROWTH TO DATE CULTURE WILL BE HELD FOR 5 DAYS BEFORE ISSUING A FINAL NEGATIVE REPORT   Report Status PENDING   Incomplete  CULTURE, BLOOD (ROUTINE X 2)     Status: None   Collection Time    11/14/12  3:45 AM      Result Value Range Status   Specimen Description BLOOD LEFT  HAND   Final   Special Requests BOTTLES DRAWN AEROBIC AND ANAEROBIC 10CC   Final   Culture  Setup Time 11/14/2012 08:33   Final   Culture     Final   Value:        BLOOD CULTURE RECEIVED NO GROWTH TO DATE CULTURE WILL BE HELD FOR 5 DAYS BEFORE ISSUING A FINAL NEGATIVE REPORT   Report Status PENDING   Incomplete     Studies: No results found.  Scheduled Meds: . antiseptic oral rinse  15 mL Mouth Rinse q12n4p  . chlorhexidine  15 mL Mouth Rinse BID  . ciprofloxacin  400 mg Intravenous Q12H  . heparin subcutaneous  5,000 Units Subcutaneous Q8H  . metronidazole  500 mg Intravenous Q8H  . nicotine  21 mg Transdermal Daily  . pantoprazole (PROTONIX) IV  40 mg Intravenous Q12H  . potassium chloride  20 mEq Oral BID   Continuous Infusions: . dextrose 5 % and 0.45 % NaCl with KCl 20 mEq/L 125 mL/hr at 11/17/12 0809    Principal Problem:   Diverticulitis of large intestine Active Problems:   HTN (hypertension)   Leukocytosis, unspecified    Time spent: 25 minutes.     Preston [name]  [facility] Pager [phone]. If 7PM-7AM, please contact night-coverage at [email or web address], password [facility] 11/17/2012, 11:21 AM  LOS: 4 days

## 2012-11-17 NOTE — Progress Notes (Signed)
Patient ID: Preston [name], male   DOB: 12-31-1970, 42 y.o.   MRN: [id number]    Subjective: Continues to feel better, only pain with palp, denies n/v, feels hungry, tol diet  Objective: Vital signs in last 24 hours: Temp:  [98 F (36.7 C)-98.7 F (37.1 C)] 98 F (36.7 C) (04/21 0607) Pulse Rate:  [72-92] 72 (04/21 0607) Resp:  [16-18] 18 (04/21 0607) BP: (133-145)/(83-97) 145/97 mmHg (04/21 0607) SpO2:  [95 %-99 %] 95 % (04/21 0607) Last BM Date: 11/16/12  Intake/Output from previous day: 04/20 0701 - 04/21 0700 In: 4697.9 [I.V.:4697.9] Out: 1751 [Urine:1750; Stool:1] Intake/Output this shift:    General: alert. NAD Resp: Clear to auscultation bilaterally GI: Abdomen soft, slightly distended. active bowel sounds. Min tenderness in suprapubic area, no guarding.  Lab Results:   Recent Labs  11/16/12 0610 11/17/12 0547  WBC 11.6* 7.5  HGB 13.4 12.7*  HCT 38.4* 37.3*  PLT 253 297   BMET  Recent Labs  11/16/12 0610 11/17/12 0547  NA 133* 133*  K 3.5 3.4*  CL 98 97  CO2 25 26  GLUCOSE 106* 132*  BUN 5* 3*  CREATININE 0.71 0.68  CALCIUM 8.3* 9.0   PT/INR No results found for this basename: LABPROT, INR,  in the last 72 hours ABG No results found for this basename: PHART, PCO2, PO2, HCO3,  in the last 72 hours  Studies/Results: No results found.  Anti-infectives: Anti-infectives   Start     Dose/Rate Route Frequency Ordered Stop   11/14/12 1430  ciprofloxacin (CIPRO) IVPB 400 mg     400 mg 200 mL/hr over 60 Minutes Intravenous Every 12 hours 11/14/12 0409     11/14/12 1030  metroNIDAZOLE (FLAGYL) IVPB 500 mg     500 mg 100 mL/hr over 60 Minutes Intravenous Every 8 hours 11/14/12 0409     11/14/12 0230  ciprofloxacin (CIPRO) IVPB 400 mg     400 mg 200 mL/hr over 60 Minutes Intravenous  Once 11/14/12 0224 11/14/12 0436   11/14/12 0230  metroNIDAZOLE (FLAGYL) IVPB 500 mg     500 mg 100 mL/hr over 60 Minutes Intravenous  Once 11/14/12 0224 11/14/12 0539       Assessment/Plan:  Acute sigmoid diverticulitis. Suspect microperforation and possible contained intramural abscess. Stable on IV antibiotics, seems to be improving No indication for surgical intervention at this point. Continue antibiotics Start Clear liquid diet today. Check labs, WBC down to 7 Ambulate more Will advance diet to fulls today, he may be able to go to PO abx tomorrow   LOS: 4 days    [facility] Office:   [phone]   11/17/2012

## 2012-11-17 NOTE — Progress Notes (Signed)
Called Dr Harlon Flor to confirm low fat diet ok per Dr Sunnie Nielsen request to confirm w/ surgery. Ok to advance diet per Dr Harlon Flor.

## 2012-11-18 DIAGNOSIS — I10 Essential (primary) hypertension: Secondary | ICD-10-CM

## 2012-11-18 DIAGNOSIS — K5732 Diverticulitis of large intestine without perforation or abscess without bleeding: Secondary | ICD-10-CM

## 2012-11-18 DIAGNOSIS — D72829 Elevated white blood cell count, unspecified: Secondary | ICD-10-CM

## 2012-11-18 DIAGNOSIS — M109 Gout, unspecified: Secondary | ICD-10-CM

## 2012-11-18 LAB — BASIC METABOLIC PANEL
BUN: 3 mg/dL — ABNORMAL LOW (ref 6–23)
Chloride: 99 mEq/L (ref 96–112)
GFR calc Af Amer: >90 mL/min (ref >90)
GFR calc non Af Amer: >90 mL/min (ref >90)
Potassium: 4.1 mEq/L (ref 3.5–5.1)
Sodium: 136 mEq/L (ref 135–145)

## 2012-11-18 LAB — CBC
HCT: 41.6 % (ref 39.0–52.0)
RDW: 12.9 % (ref 11.5–15.5)
WBC: 7.5 10*3/uL (ref 4.0–10.5)

## 2012-11-18 MED ORDER — CIPROFLOXACIN HCL 500 MG PO TABS: 500.0000 mg | ORAL_TABLET | Freq: Two times a day (BID) | ORAL | Status: DC

## 2012-11-18 MED ORDER — HYDROCODONE-ACETAMINOPHEN 10-325 MG PO TABS: 1.0000 | ORAL_TABLET | ORAL | Status: DC | PRN

## 2012-11-18 MED ORDER — HYDROCODONE-ACETAMINOPHEN 10-325 MG PO TABS
1.0000 | ORAL_TABLET | ORAL | Status: DC | PRN
Start: 2012-11-18 — End: 2012-12-10

## 2012-11-18 MED ORDER — HYDROCHLOROTHIAZIDE 12.5 MG PO TABS: 25.0000 mg | ORAL_TABLET | Freq: Every day | ORAL | Status: DC

## 2012-11-18 MED ORDER — METRONIDAZOLE 500 MG PO TABS: 500.0000 mg | ORAL_TABLET | Freq: Three times a day (TID) | ORAL | Status: DC

## 2012-11-18 MED ORDER — OMEPRAZOLE 20 MG PO CPDR: 20.0000 mg | DELAYED_RELEASE_CAPSULE | Freq: Every day | ORAL | Status: DC

## 2012-11-18 MED ORDER — NICOTINE 21 MG/24HR TD PT24: 1.0000 | MEDICATED_PATCH | Freq: Every day | TRANSDERMAL | Status: DC

## 2012-11-18 NOTE — Discharge Summary (Signed)
Physician Discharge Summary  Preston [name] WUJ:[id number] DOB: Jul 23, 1971 DOA: 11/13/2012  PCP: No PCP Per Patient  Admit date: 11/13/2012 Discharge date: 11/18/2012  Time spent: 35 minutes  Recommendations for Outpatient Follow-up:  1. Need to follow up with surgery for further management.   Discharge Diagnoses:    Diverticulitis of large intestine   HTN (hypertension)   Leukocytosis, unspecified   Discharge Condition: Stable.  Diet recommendation: Low Fat diet  Filed Weights   11/13/12 2351  Weight: 129.275 kg (285 lb)    History of present illness:  Preston [name] is a 42 y.o. male with 2 day history of lower abdominal pain and a couple of episodes of BRBPR. Stools initially diarrhea mixed with blood but have become grossly bloody he states. He has also had N/V, and decreased appetite during this period. Pain is dull but described as severe at times. Worse with movement, palpation, or attempts at BMs. Was seen in ED yesterday evening but not evaluated by CT scan, he returned this evening because pain persisted.   Hospital Course:  1-Diverticulitis with possible wall Abscess vs microperforation:  -Patient was admitted with diverticulitis and possible wall abscess. He was started on IV  ciprofloxacin and flagyl, he received a total of 4 days of antibiotics while in the hospital. He was give prescription for 10 more days. He did well on medical management. WBC decrease to 7 from 20. He was able to tolerate regular diet prior to discharge. Surgery was consulted for question of microperforation, Vs wall abscess. Patient didn't required surgery during this admission.  -WBC decrease to 7. WBC Peak to 20.  -Patient recieved  IV fluids, electrolytes replacement.  -Surgery assistance appreciated.   2-GI bleed: no more episode, possible from diverticulitis. Monitor hb. Hb at 14. Hb on admission at 17, Probably hemoconcentration secondary to dehydration.  3-DVT prophylaxis: SCD, avoid  anticoagulation due to risk for bleed. Heparin started by surgery.  4-Hypokalemia:.replaced.    Procedures:  none  Consultations:  Surgery  Discharge Exam: Filed Vitals:   11/17/12 0607 11/17/12 1345 11/17/12 2124 11/18/12 0532  BP: 145/97 164/96 150/100 130/83  Pulse: 72 75 75 69  Temp: 98 F (36.7 C) 98.2 F (36.8 C) 98.3 F (36.8 C) 97.8 F (36.6 C)  TempSrc: Oral Oral Oral Oral  Resp: 18 20 20 18   Height:      Weight:      SpO2: 95% 100% 100% 99%    General: No distress. Cardiovascular: S 1, S 2 RRR Respiratory: CTA Abdomen: soft, NT, ND  Discharge Instructions  Discharge Orders   Future Orders Complete By Expires     Increase activity slowly  As directed         Medication List    STOP taking these medications       hydrochlorothiazide 25 MG tablet  Commonly known as:  HYDRODIURIL     ibuprofen 200 MG tablet  Commonly known as:  ADVIL,MOTRIN     oxyCODONE-acetaminophen 5-325 MG per tablet  Commonly known as:  PERCOCET/ROXICET      TAKE these medications       acetaminophen 500 MG tablet  Commonly known as:  TYLENOL  Take 1,000 mg by mouth every 4 (four) hours as needed for pain.     albuterol 108 (90 BASE) MCG/ACT inhaler  Commonly known as:  PROVENTIL HFA;VENTOLIN HFA  Inhale 2 puffs into the lungs every 6 (six) hours as needed for wheezing.     ciprofloxacin 500  MG tablet  Commonly known as:  CIPRO  Take 1 tablet (500 mg total) by mouth 2 (two) times daily.     colchicine 0.6 MG tablet  Take 0.6 mg by mouth every 3 (three) hours as needed (for gout pain).     HYDROcodone-acetaminophen 10-325 MG per tablet  Commonly known as:  NORCO  Take 1 tablet by mouth every 4 (four) hours as needed.     metroNIDAZOLE 500 MG tablet  Commonly known as:  FLAGYL  Take 1 tablet (500 mg total) by mouth 3 (three) times daily.     nicotine 21 mg/24hr patch  Commonly known as:  NICODERM CQ - dosed in mg/24 hours  Place 1 patch onto the skin daily.      omeprazole 20 MG capsule  Commonly known as:  PRILOSEC  Take 1 capsule (20 mg total) by mouth daily.           Follow-up Information   Follow up with Ernestene Mention, MD. Call in 2 weeks. (OFFICE SHOULD CALL YOU WITH YOUR APPT TIME)    Contact information:   [address] [place] [address] [phone]        The results of significant diagnostics from this hospitalization (including imaging, microbiology, ancillary and laboratory) are listed below for reference.    Significant Diagnostic Studies: Ct Abdomen Pelvis W Contrast  11/14/2012  *RADIOLOGY REPORT*  Clinical Data: Abdominal pain; rectal bleeding.  Nausea, vomiting and diarrhea.  CT ABDOMEN AND PELVIS WITH CONTRAST  Technique:  Multidetector CT imaging of the abdomen and pelvis was performed following the standard protocol during bolus administration of intravenous contrast.  Contrast: OMNIPAQUE IOHEXOL 300 MG/ML  SOLN  Comparison: CT of the abdomen and pelvis performed 07/04/2009  Findings: The visualized lung bases are clear.  The liver and spleen are unremarkable in appearance.  The gallbladder is within normal limits.  The pancreas and adrenal glands are unremarkable.  Minimal nonspecific perinephric stranding is noted bilaterally.  An apparent tiny right renal cyst is stable from 2010 and likely benign.  No hydronephrosis is seen.  No renal or ureteral stones are identified.  The small bowel is unremarkable in appearance.  The stomach is within normal limits.  No acute vascular abnormalities are seen. Prominent mesenteric nodes may reflect the patient's pelvic process.  The patient is status post appendectomy.  There is marked soft tissue inflammation along the proximal to mid sigmoid colon, with significant wall thickening and several inflamed diverticula.  There is a collection of fluid and air noted along the anterior inferior aspect of the sigmoid colon, measuring 3.7 x 2.7 cm, which could reflect  a focal microperforation or possibly a wall abscess.  No significant free air or free fluid is noted within the remainder of the abdomen or pelvis to suggest frank perforation.  Extensive soft tissue inflammation and a small amount of fluid are seen tracking about the pelvis, with marked associated soft tissue inflammation along the bladder and associated bladder wall thickening.  The prostate remains normal in size.  No inguinal lymphadenopathy is seen.  No acute osseous abnormalities are identified.  Mild degenerative change is noted at the right hip joint.  IMPRESSION:  1.  Severe acute diverticulitis involving the proximal to mid sigmoid colon, with marked soft tissue inflammation and significant wall thickening.  Collection of fluid and air along the anterior inferior aspect of the sigmoid colon, measuring 3.7 x 2.7 cm, could reflect a focal microperforation or  possibly a wall abscess.  No significant free air or free fluid seen within the remainder of the abdomen or pelvis to suggest frank perforation.  Extensive soft tissue inflammation and a small amount of fluid noted tracking about the pelvis, with marked soft tissue inflammation along the bladder and associated bladder wall thickening. 2.  Prominent mesenteric nodes may reflect the colonic process. 3.  Stable likely tiny right renal cyst noted. 4.  Mild degenerative change at the right hip joint.  These results were called by telephone on 11/14/2012 at 01:55 a.m. to Dr. [name], who verbally acknowledged these results.   Original Report Authenticated By: Tonia Ghent, M.D.     Microbiology: Recent Results (from the past 240 hour(s))  CULTURE, BLOOD (ROUTINE X 2)     Status: None   Collection Time    11/14/12  3:40 AM      Result Value Range Status   Specimen Description BLOOD LEFT ARM   Final   Special Requests BOTTLES DRAWN AEROBIC AND ANAEROBIC 10CC   Final   Culture  Setup Time 11/14/2012 08:33   Final   Culture     Final   Value:         BLOOD CULTURE RECEIVED NO GROWTH TO DATE CULTURE WILL BE HELD FOR 5 DAYS BEFORE ISSUING A FINAL NEGATIVE REPORT   Report Status PENDING   Incomplete  CULTURE, BLOOD (ROUTINE X 2)     Status: None   Collection Time    11/14/12  3:45 AM      Result Value Range Status   Specimen Description BLOOD LEFT HAND   Final   Special Requests BOTTLES DRAWN AEROBIC AND ANAEROBIC 10CC   Final   Culture  Setup Time 11/14/2012 08:33   Final   Culture     Final   Value:        BLOOD CULTURE RECEIVED NO GROWTH TO DATE CULTURE WILL BE HELD FOR 5 DAYS BEFORE ISSUING A FINAL NEGATIVE REPORT   Report Status PENDING   Incomplete     Labs: Basic Metabolic Panel:  Recent Labs Lab 11/14/12 0509 11/15/12 0500 11/16/12 0610 11/17/12 0547 11/18/12 0548  NA 134* 135 133* 133* 136  K 3.2* 3.3* 3.5 3.4* 4.1  CL 98 97 98 97 99  CO2 23 25 25 26 29   GLUCOSE 109* 115* 106* 132* 116*  BUN 6 6 5* 3* 3*  CREATININE 0.78 0.79 0.71 0.68 0.76  CALCIUM 9.0 8.7 8.3* 9.0 9.4  MG  --  1.4* 1.9  --   --    Liver Function Tests:  Recent Labs Lab 11/12/12 2352  AST 30  ALT 65*  ALKPHOS 73  BILITOT 0.4  PROT 8.1  ALBUMIN 4.3   No results found for this basename: LIPASE, AMYLASE,  in the last 168 hours No results found for this basename: AMMONIA,  in the last 168 hours CBC:  Recent Labs Lab 11/12/12 2352  11/13/12 2356 11/14/12 0509 11/15/12 0500 11/16/12 0610 11/17/12 0547 11/18/12 0548  WBC 17.0*  < > 19.8* 21.2* 18.4* 11.6* 7.5 7.5  NEUTROABS 13.8*  --  17.4*  --   --   --   --   --   HGB 18.0*  < > 17.8* 16.5 14.5 13.4 12.7* 14.4  HCT 49.3  < > 49.2 45.3 42.3 38.4* 37.3* 41.6  MCV 88.8  < > 89.1 90.1 90.8 91.2 89.7 90.4  PLT 278  < > 259 237 245 253 297  333  < > = values in this interval not displayed. Cardiac Enzymes: No results found for this basename: CKTOTAL, CKMB, CKMBINDEX, TROPONINI,  in the last 168 hours BNP: BNP (last 3 results) No results found for this basename: PROBNP,  in the  last 8760 hours CBG: No results found for this basename: GLUCAP,  in the last 168 hours     Signed:  Amato Sevillano  [facility] 11/18/2012, 9:48 AM

## 2012-11-18 NOTE — Progress Notes (Signed)
Subjective: Pt feeling much better today.  Tolerating a regular diet.  Pt ambulating well. +BM's and flatus.  Wants to go home.  Needs help getting prescription meds.  Objective: Vital signs in last 24 hours: Temp:  [97.8 F (36.6 C)-98.3 F (36.8 C)] 97.8 F (36.6 C) (04/22 0532) Pulse Rate:  [69-75] 69 (04/22 0532) Resp:  [18-20] 18 (04/22 0532) BP: (130-164)/(83-100) 130/83 mmHg (04/22 0532) SpO2:  [99 %-100 %] 99 % (04/22 0532) Last BM Date: 11/16/12  Intake/Output from previous day: 04/21 0701 - 04/22 0700 In: 360 [P.O.:360] Out: 1550 [Urine:1550] Intake/Output this shift:    PE: Gen:  Alert, NAD, pleasant Abd: Soft, LLQ mild tenderness, ND, +BS, no HSM   Lab Results:   Recent Labs  11/17/12 0547 11/18/12 0548  WBC 7.5 7.5  HGB 12.7* 14.4  HCT 37.3* 41.6  PLT 297 333   BMET  Recent Labs  11/17/12 0547 11/18/12 0548  NA 133* 136  K 3.4* 4.1  CL 97 99  CO2 26 29  GLUCOSE 132* 116*  BUN 3* 3*  CREATININE 0.68 0.76  CALCIUM 9.0 9.4   PT/INR No results found for this basename: LABPROT, INR,  in the last 72 hours CMP     Component Value Date/Time   NA 136 11/18/2012 0548   K 4.1 11/18/2012 0548   CL 99 11/18/2012 0548   CO2 29 11/18/2012 0548   GLUCOSE 116* 11/18/2012 0548   BUN 3* 11/18/2012 0548   CREATININE 0.76 11/18/2012 0548   CALCIUM 9.4 11/18/2012 0548   PROT 8.1 11/12/2012 2352   ALBUMIN 4.3 11/12/2012 2352   AST 30 11/12/2012 2352   ALT 65* 11/12/2012 2352   ALKPHOS 73 11/12/2012 2352   BILITOT 0.4 11/12/2012 2352   GFRNONAA >90 11/18/2012 0548   GFRAA >90 11/18/2012 0548   Lipase  No results found for this basename: lipase       Studies/Results: No results found.  Anti-infectives: Anti-infectives   Start     Dose/Rate Route Frequency Ordered Stop   11/18/12 0000  metroNIDAZOLE (FLAGYL) 500 MG tablet     500 mg Oral 3 times daily 11/18/12 0930     11/18/12 0000  ciprofloxacin (CIPRO) 500 MG tablet     500 mg Oral 2 times daily  11/18/12 0930     11/14/12 1430  ciprofloxacin (CIPRO) IVPB 400 mg     400 mg 200 mL/hr over 60 Minutes Intravenous Every 12 hours 11/14/12 0409     11/14/12 1030  metroNIDAZOLE (FLAGYL) IVPB 500 mg     500 mg 100 mL/hr over 60 Minutes Intravenous Every 8 hours 11/14/12 0409     11/14/12 0230  ciprofloxacin (CIPRO) IVPB 400 mg     400 mg 200 mL/hr over 60 Minutes Intravenous  Once 11/14/12 0224 11/14/12 0436   11/14/12 0230  metroNIDAZOLE (FLAGYL) IVPB 500 mg     500 mg 100 mL/hr over 60 Minutes Intravenous  Once 11/14/12 0224 11/14/12 0539       Assessment/Plan Acute sigmoid diverticulitis. Suspect microperforation and possible contained intramural abscess. 1.  Can likely go home today, wrote for rx's (cipro/flagyl) for 10 days, pain meds 2.  F/U appt with Dr. Derrell Lolling in 2-3 weeks, office will call 3.  Low fiber/residue diet until surgery 4.  Consult to Case manager prior to discharge to help with meds  HTN - out of meds, put in rx, needs PCP resources as OP Gout   LOS: 5 days  Aris Georgia 11/18/2012, 9:32 AM Pager: [phone]

## 2012-11-18 NOTE — Progress Notes (Signed)
Much better.  Can go home from our standpoint Marta Lamas. Gae Bon, MD, FACS [phone] [facility]

## 2012-11-18 NOTE — Progress Notes (Signed)
Pt dischraged to home

## 2012-11-18 NOTE — Progress Notes (Signed)
Patient looks great.  No abdominal pain, very little tenderness.  Having bowel movements.  Okay to go home with oral antibiotics for total of 2 weeks.  Marta Lamas. Gae Bon, MD, FACS [phone] [facility]

## 2012-11-20 LAB — CULTURE, BLOOD (ROUTINE X 2): Culture: NO GROWTH

## 2012-11-27 ENCOUNTER — Telehealth (INDEPENDENT_AMBULATORY_CARE_PROVIDER_SITE_OTHER): Payer: Self-pay | Admitting: General Surgery

## 2012-11-27 NOTE — Telephone Encounter (Signed)
Spoke with patient he is aware of appt  12/09/12 at 3pm

## 2012-12-08 ENCOUNTER — Telehealth (INDEPENDENT_AMBULATORY_CARE_PROVIDER_SITE_OTHER): Payer: Self-pay | Admitting: General Surgery

## 2012-12-08 NOTE — Telephone Encounter (Signed)
Patient is aware of appt on 12/16/12 at  1145

## 2012-12-09 ENCOUNTER — Encounter (INDEPENDENT_AMBULATORY_CARE_PROVIDER_SITE_OTHER): Payer: Self-pay | Admitting: General Surgery

## 2012-12-10 ENCOUNTER — Emergency Department (HOSPITAL_COMMUNITY)
Admission: EM | Admit: 2012-12-10 | Discharge: 2012-12-10 | Disposition: A | Discharge status: Home / Self Care | Payer: Medicaid Other | Attending: Emergency Medicine | Admitting: Emergency Medicine

## 2012-12-10 DIAGNOSIS — R61 Generalized hyperhidrosis: Secondary | ICD-10-CM | POA: Insufficient documentation

## 2012-12-10 DIAGNOSIS — M2540 Effusion, unspecified joint: Secondary | ICD-10-CM | POA: Insufficient documentation

## 2012-12-10 DIAGNOSIS — Z79899 Other long term (current) drug therapy: Secondary | ICD-10-CM | POA: Insufficient documentation

## 2012-12-10 DIAGNOSIS — F17200 Nicotine dependence, unspecified, uncomplicated: Secondary | ICD-10-CM | POA: Insufficient documentation

## 2012-12-10 DIAGNOSIS — M109 Gout, unspecified: Secondary | ICD-10-CM

## 2012-12-10 DIAGNOSIS — I10 Essential (primary) hypertension: Secondary | ICD-10-CM | POA: Insufficient documentation

## 2012-12-10 MED ORDER — COLCHICINE 0.6 MG PO TABS: ORAL_TABLET | ORAL | Status: DC

## 2012-12-10 MED ORDER — INDOMETHACIN 25 MG PO CAPS: 25.0000 mg | ORAL_CAPSULE | Freq: Three times a day (TID) | ORAL | Status: DC | PRN

## 2012-12-10 MED ORDER — ALLOPURINOL 300 MG PO TABS: 300.0000 mg | ORAL_TABLET | Freq: Every day | ORAL | Status: DC

## 2012-12-10 MED ORDER — HYDROCODONE-ACETAMINOPHEN 5-325 MG PO TABS
2.0000 | ORAL_TABLET | Freq: Once | ORAL | Status: AC
  Administered 2012-12-10: 2 via ORAL
  Filled 2012-12-10: qty 2

## 2012-12-10 MED ORDER — INDOMETHACIN 25 MG PO CAPS
25.0000 mg | ORAL_CAPSULE | Freq: Once | ORAL | Status: AC
  Administered 2012-12-10: 25 mg via ORAL
  Filled 2012-12-10: qty 1

## 2012-12-10 MED ORDER — HYDROCODONE-ACETAMINOPHEN 5-325 MG PO TABS: ORAL_TABLET | ORAL | Status: DC

## 2012-12-10 NOTE — ED Notes (Signed)
Patient states he has been having multiple sites Big toe  Ankle and the knee all on the left side for 48 hours he also feels some discomfort in the right knee. History of Gout and he states "I have been having ongoing issues with this since Thanksgiving 2013."

## 2012-12-10 NOTE — Discharge Instructions (Signed)
Arthritis - Gout Gout is caused by uric acid crystals forming in the joint. The big toe, foot, ankle, and knee are the joints most often affected.Often uric acid levels in the blood are also elevated. Symptoms develop rapidly, usually over hours. A joint fluid exam may be needed to prove the diagnosis. Acute gout episodes may follow a minor injury, surgery, illness or medication change. Gout occurs more often in men. It tends to be an inherited (passed down from a family member) condition. Your chance of having gout is increased if you take certain medications. These include water pills (diuretics), aspirin, niacin, and cyclosporine. Kidney disease and alcohol consumption may also increase your chances of getting gout. Months or years can pass between gout attacks.  Treatment includes ice, rest and raising the affected limb until the swelling and pain are better.Anti-inflammatory medicine usually brings about dramatic relief of pain, redness and swelling within 2 to 3 days. Other medications can also be effective. Increase your fluid intake. Do not drink alcohol or eat liver, sweetbreads, or sardines. Long-term gout management may require medicine to lower blood uric acid levels, or changing or stopping diuretics. Please see your caregiver if your condition is not better after 1 to 2 days of treatment.  SEEK IMMEDIATE MEDICAL CARE IF:  You have more serious symptoms such as a fever, skin rash, diarrhea, vomiting, or other joint pains. Document Released: 08/23/2004 Document Revised: 07/05/2011 Document Reviewed: 09/06/2008 [facility] Patient Information 2012 [place], [place]. Purine Restricted Diet A low-purine diet consists of foods that reduce uric acid made in your body. INDICATIONS FOR USE  Your caregiver may ask you to follow a low-purine diet to reduce gout flairs.  GUIDELINES  Avoid high-purine foods, including all alcohol, yeast extracts taken as supplements, and sauces made from meats (like gravy).  Do not eat high-purine meats, including anchovies, sardines, herring, mussels, tuna, codfish, scallops, trout, haddock, bacon, organ meats, tripe, goose, wild game, and sweetbreads.  Grains  Allowed/Recommended: All, except those listed to consume in moderation.  Consume in Moderation: Oatmeal ( cup uncooked daily), wheat bran or germ ( cup daily), and whole grains. Vegetables  Allowed/Recommended: All, except those listed to consume in moderation.  Consume in Moderation: Asparagus, cauliflower, spinach, mushrooms, and green peas ( cup daily). Fruit  Allowed/Recommended: All.  Consume in Moderation: None. Meat and Meat Substitutes  Allowed/Recommended: Eggs, nuts, and peanut butter.  Consume in Moderation: Limit to 4 to 6 oz daily. Avoid high-purine meats. Lentils, peas, and dried beans (1 cup daily). Milk  Allowed/Recommended: All. Choose low-fat or skim when possible.  Consume in Moderation: None. Fats and Oils  Allowed/Recommended: All.  Consume in Moderation: None. Beverages  Allowed/Recommended: All, except those listed to avoid.  Avoid: All alcohol. Condiments/Miscellaneous  Allowed/Recommended: All, except those listed to consume in moderation.  Consume in Moderation: Bouillon and meat-based broths and soups. Document Released: 11/10/2010 Document Revised: 10/08/2011 Document Reviewed: 11/10/2010 [facility] Patient Information 2013 [place], [place].  RESOURCE GUIDE  Dental Problems  Patients with Medicaid: [facility] [phone]                                           [phone] [address] [employer] [address] Phone:  [phone]                                                  Phone:  [phone]  If unable to pay or uninsured, contact:  [facility] or [facility]. to become qualified for the adult dental clinic.  Chronic Pain Problems Contact [facility]  [phone] Patients need to be referred by their primary care doctor.  Insufficient Money for Medicine Contact [employer] Way:  call "211" or [facility] [phone].  No Primary Care Doctor Call [facility]  ([phone] Other agencies that provide inexpensive medical care    [facility]  201-788-2018    [facility]  [phone]    [facility]  [phone]    [facility]  [phone]    [facility]  ([phone]    [facility]  [phone]  Psychological Services [facility]  [phone] [facility]  [phone] [facility]   [phone] (emergency services [phone])  Substance Abuse Resources [facility]  [phone] [phone] [facility] [phone] [facility] [place] [phone] Floydene Flock [phone] Residential & Outpatient Substance Abuse Program  747-808-5479  Abuse/Neglect [facility] Child Abuse Hotline 515-382-2514 [facility] Child Abuse Hotline 6603008260 (After Hours)  Emergency Shelter [facility] (570)[phone]  Maternity Homes [facility] at [facility] [place] [facility] ([phone] [facility] [phone]  MRSA Hotline #:   (850)513-8341    [facility] Resources  [facility]     United Way                          [facility] Dept. [address] [place]                       [address]      [address]  Blondell Reveal Phone:  [phone]                                   Phone:  [phone]                 Phone:  [phone]  [facility] Phone:  [phone]  [facility] Child Abuse Hotline (669) 644-4999 231-638-2612 (After  Hours)

## 2012-12-10 NOTE — ED Provider Notes (Signed)
History    This chart was scribed for non-physician practitioner [name] working with [name], MD by [name], ED Scribe. This patient was seen in room TR06C/TR06C and the patient's care was started at 9:38 PM .   CSN: [id number]  Arrival date & time 12/10/12  1946      Chief Complaint  Patient presents with  . Knee Pain     The history is provided by the patient. No language interpreter was used.    HPI Comments: Preston [name] is a 42 y.o. male with hx of gout who presents to the Emergency Department complaining of pain and swelling in left and right big toes that began 3-4 days ago, with accompanying swelling and pain in left knee that began 2 days ago.  Pt reports that for the past 5 months he has had intermittent episodes of gout in isolated areas that resolve on their own, but denies h/o gout in more than one place at a time.  Pt presented to ED in March 2014 for gout in the right big toe and was given 10 days worth of Indomethacin for that condition, which relieved symptoms.  Pt states that pain in affected areas is aggravated by bending the joints, and temporarily relieved by hot showers.  He notes that symptoms feel similar to his past gout flare-ups.  He also notes that he experienced diaphoresis at the beginning of the present episode, which is typical for pt.  He denies dysuria, hematuria or any other urinary symptoms.  Pt notes that he was taking Allopurinol regularly but ran out 1-2 months ago.  He has taken colchicine since the beginning of this flare-up, which did not relieve pain.  He has also attempted to treat pain with Advil with no effect.  Pt also has h/o diverticulitis and HTN.  Pt has no PCP.   Past Medical History  Diagnosis Date  . Gout   . Hypertension     Past Surgical History  Procedure Laterality Date  . Appendectomy      No family history on file.  History  Substance Use Topics  . Smoking status: Current Every Day Smoker  .  Smokeless tobacco: Not on file  . Alcohol Use: Yes      Review of Systems  Constitutional: Positive for diaphoresis.  Genitourinary: Negative for dysuria and hematuria.  Musculoskeletal: Positive for joint swelling and arthralgias.  All other systems reviewed and are negative.    Allergies  Review of patient's allergies indicates no known allergies.  Home Medications   Current Outpatient Rx  Name  Route  Sig  Dispense  Refill  . albuterol (PROVENTIL HFA;VENTOLIN HFA) 108 (90 BASE) MCG/ACT inhaler   Inhalation   Inhale 2 puffs into the lungs every 6 (six) hours as needed for wheezing.         . colchicine 0.6 MG tablet   Oral   Take 0.6 mg by mouth every 3 (three) hours as needed (for gout pain).          [name] esomeprazole (NEXIUM) 40 MG capsule   Oral   Take 40 mg by mouth daily before breakfast.         . ibuprofen (ADVIL,MOTRIN) 200 MG tablet   Oral   Take 600 mg by mouth every 6 (six) hours as needed for pain.          [name] allopurinol (ZYLOPRIM) 300 MG tablet   Oral   Take 1 tablet (300 mg  total) by mouth daily.   30 tablet   0   . colchicine 0.6 MG tablet      Take 2 pills at first sign of flare, followed in 1 hour by 1 pill.  May take 1 pill every 12 hours after initial treatment until pills are gone   8 tablet   0   . HYDROcodone-acetaminophen (NORCO/VICODIN) 5-325 MG per tablet      Take 1-2 tabs every 4-6 hours as needed for pain   6 tablet   0   . indomethacin (INDOCIN) 25 MG capsule   Oral   Take 1 capsule (25 mg total) by mouth 3 (three) times daily as needed.   30 capsule   0     BP 144/98  Pulse 70  Temp(Src) 98.4 F (36.9 C) (Oral)  Resp 16  SpO2 98%  Physical Exam  Nursing note and vitals reviewed. Constitutional: He is oriented to person, place, and time. He appears well-developed and well-nourished. No distress.  HENT:  Head: Normocephalic and atraumatic.  Eyes: EOM are normal.  Neck: Normal range of motion. Neck supple.  No tracheal deviation present.  Cardiovascular: Normal rate.   Pulmonary/Chest: Effort normal and breath sounds normal. No respiratory distress. He has no wheezes. He has no rales.  Musculoskeletal: He exhibits tenderness (Moderate-to-severe tenderness in left knee and left great toe.  Mild tenderness to right great toe and right knee.). He exhibits no edema.  Limited left knee flexion due to pain. Limited left great toe flexion and extension due to pain.  Neurological: He is alert and oriented to person, place, and time. Coordination normal.  Skin: Skin is warm and dry. No rash noted. No erythema.  Warmth over left knee.  Psychiatric: He has a normal mood and affect. His behavior is normal.    ED Course  Procedures (including critical care time)  DIAGNOSTIC STUDIES: Oxygen Saturation is 98% on room air, normal by my interpretation.    COORDINATION OF CARE: 9:44 PM-Offered to write pt a prescription for Allopurinol, Indomethacin, colchicine and Vicodin.  Instructed pt to take only Indomethacin, colchicine and pain meds until present symptoms are controlled, and then to begin taking Allopurinol. Pt expressed understanding and was agreeable to plan.    Labs Reviewed - No data to display No results found.   1. Gout attack       MDM  Pt presented with gouty attack in both knees and left great toe. States he is just getting over attack in right great toe.  He just received Medicaid and does not have a PCP so only knew to come to ED.  States he has been on Allopurinol in the past and has changed his diet but unable to get refills on allopurinol due to lack of pcp.  Has had colchicine in past for flares, which has helped.  Denies fevers, nausea or vomiting. Reports diarrhea but has diverticulitis.  Left knee is warm to touch.  Both knees, and left great toe ttp, left toe tender with slight touch.  No visible swelling or erythema.  Do not believe imaging would be beneficial at this time due  to diffuse nature of gouty attack.  Will have pt f/u with pcp and possible referral to rheumatologist for better management.   Discussed pt with Dr. Lynelle Doctor who agreed with assessment and plan.  Will provide pt w/ prescriptions and resource guide. F/u with PCP.  Tx in ED: norco and indomethacin.   Rx: norco, indomethacin, colchicine,  and allopurinol and resource guide.    I personally performed the services described in this documentation, which was scribed in my presence. The recorded information has been reviewed and is accurate.  Vitals: unremarkable. Discharged in stable condition.    Discussed pt with attending during ED encounter.    [name], PA-C 12/11/12 1302

## 2012-12-10 NOTE — ED Notes (Signed)
Patient discharged using the teach back method and patient able to verbalizes an understanding

## 2012-12-10 NOTE — ED Notes (Signed)
Pt states that his left and right big toe, left knee are swollen and that sometime his gout medication helps and sometimes it does not. Hurts to bend his joints. Pain feels like needles.

## 2012-12-11 NOTE — ED Provider Notes (Signed)
Medical screening examination/treatment/procedure(s) were performed by non-physician practitioner and as supervising physician I was immediately available for consultation/collaboration.     [name], MD 12/11/12 1540

## 2012-12-16 ENCOUNTER — Encounter (INDEPENDENT_AMBULATORY_CARE_PROVIDER_SITE_OTHER): Payer: Self-pay | Admitting: General Surgery

## 2012-12-16 ENCOUNTER — Ambulatory Visit (INDEPENDENT_AMBULATORY_CARE_PROVIDER_SITE_OTHER): Payer: Medicaid Other | Admitting: General Surgery

## 2012-12-16 VITALS — BP 142/94 | HR 83 | Temp 97.7°F | Ht 74.0 in | Wt 259.8 lb

## 2012-12-16 DIAGNOSIS — K5732 Diverticulitis of large intestine without perforation or abscess without bleeding: Secondary | ICD-10-CM

## 2012-12-16 NOTE — Progress Notes (Signed)
Patient ID: Preston [name], male   DOB: 03/02/1971, 42 y.o.   MRN: [id number]  Chief Complaint  Patient presents with  . Follow-up    reck diverticulitis    HPI Preston [name] is a 42 y.o. male.  He was recently hospitalized for acute diverticulitis with microperforation. He did not require surgery.  He initially presented to the emergency department with abdominal pain and was sent home. Two days later he returned with abdominal pain, intermittent diarrhea, intermittent hematochezia, nausea and vomiting. CT scan showed significant inflammation of the sigmoid colon, involving the proximal to mid sigmoid with wall thickening. There was a collection of fluid and air along the anterior and inferior aspect of the sigmoid colon measuring 3.7 cm thought to be a small walled off abscess. White blood cell count was 20,000. He was not toxic. He responded to antibiotics. WBC normalized. He was discharged home after a six day hospital course on 11/18/2012 on a  2 week course of Cipro and Flagyl.  He states that he is voiding normally, normal urine color. No pneumaturia. He states that his stools are still loose. He has not seen any blood in his stools for a month. His following a low-fat, low fiber diet. He denies fever or chills. He states that he still has pain occasionally, especially when he coughs or hits a bump in his car.  He does not have a primary care physician. He does not have a job. He is on Medicaid.  HPI  Past Medical History  Diagnosis Date  . Gout   . Hypertension     Past Surgical History  Procedure Laterality Date  . Appendectomy    . Mouth surgery  2010    Family History  Problem Relation Age of Onset  . Cancer Father     Social History History  Substance Use Topics  . Smoking status: Current Every Day Smoker -- 0.25 packs/day    Types: Cigarettes  . Smokeless tobacco: Not on file  . Alcohol Use: Yes     Comment: nothing in 6weeks    No Known Allergies  Current  Outpatient Prescriptions  Medication Sig Dispense Refill  . albuterol (PROVENTIL HFA;VENTOLIN HFA) 108 (90 BASE) MCG/ACT inhaler Inhale 2 puffs into the lungs every 6 (six) hours as needed for wheezing.      . colchicine 0.6 MG tablet Take 0.6 mg by mouth every 3 (three) hours as needed (for gout pain).       . colchicine 0.6 MG tablet Take 2 pills at first sign of flare, followed in 1 hour by 1 pill.  May take 1 pill every 12 hours after initial treatment until pills are gone  8 tablet  0  . esomeprazole (NEXIUM) 40 MG capsule Take 40 mg by mouth daily before breakfast.      . ibuprofen (ADVIL,MOTRIN) 200 MG tablet Take 600 mg by mouth every 6 (six) hours as needed for pain.       . indomethacin (INDOCIN) 25 MG capsule Take 1 capsule (25 mg total) by mouth 3 (three) times daily as needed.  30 capsule  0  . allopurinol (ZYLOPRIM) 300 MG tablet Take 1 tablet (300 mg total) by mouth daily.  30 tablet  0  . HYDROcodone-acetaminophen (NORCO/VICODIN) 5-325 MG per tablet Take 1-2 tabs every 4-6 hours as needed for pain  6 tablet  0   No current facility-administered medications for this visit.    Review of Systems Review of Systems  Constitutional: Negative for fever, chills and unexpected weight change.  HENT: Negative for hearing loss, congestion, sore throat, trouble swallowing and voice change.   Eyes: Negative for visual disturbance.  Respiratory: Negative for cough and wheezing.   Cardiovascular: Negative for chest pain, palpitations and leg swelling.  Gastrointestinal: Positive for abdominal pain, diarrhea and blood in stool. Negative for nausea, vomiting, constipation, abdominal distention, anal bleeding and rectal pain.  Genitourinary: Negative for hematuria and difficulty urinating.  Musculoskeletal: Negative for arthralgias.  Skin: Negative for rash and wound.  Neurological: Negative for seizures, syncope, weakness and headaches.  Hematological: Negative for adenopathy. Does not  bruise/bleed easily.  Psychiatric/Behavioral: Negative for confusion.    Blood pressure 142/94, pulse 83, temperature 97.7 F (36.5 C), temperature source Temporal, height 6\' 2"  (1.88 m), weight 259 lb 12.8 oz (117.845 kg), SpO2 98.00%.  Physical Exam Physical Exam  Constitutional: He is oriented to person, place, and time. He appears well-developed and well-nourished. No distress.  BMI 33.3  HENT:  Head: Normocephalic.  Nose: Nose normal.  Mouth/Throat: No oropharyngeal exudate.  Eyes: Conjunctivae and EOM are normal. Pupils are equal, round, and reactive to light. Right eye exhibits no discharge. Left eye exhibits no discharge. No scleral icterus.  Neck: Normal range of motion. Neck supple. No JVD present. No tracheal deviation present. No thyromegaly present.  Cardiovascular: Normal rate, regular rhythm, normal heart sounds and intact distal pulses.   No murmur heard. Pulmonary/Chest: Effort normal and breath sounds normal. No stridor. No respiratory distress. He has no wheezes. He has no rales. He exhibits no tenderness.  Abdominal: Soft. Bowel sounds are normal. He exhibits no distension and no mass. There is no tenderness. There is no rebound and no guarding.  Somewhat obese. Soft. Not really tender except with deep palpation in the suprapubic area. No mass. No adenopathy.  Musculoskeletal: Normal range of motion. He exhibits no edema and no tenderness.  Lymphadenopathy:    He has no cervical adenopathy.  Neurological: He is alert and oriented to person, place, and time. He has normal reflexes. Coordination normal.  Skin: Skin is warm and dry. No rash noted. He is not diaphoretic. No erythema. No pallor.  Psychiatric: He has a normal mood and affect. His behavior is normal. Judgment and thought content normal.    Data Reviewed Hospital records. Lab work. Imaging studies.  Assessment    Acute sigmoid diverticulitis, probably complicated by microperforation and  microabscess.  Clinically he is improved. I will concerned that he still has some mild, intermittent pain and diarrhea. I am also concerned that he initially presented with some blood in his stool  Tobacco abuse  Asthma  Gout  GERD  History laparoscopic appendectomy, Dr. Donell Beers, 2010 for acute appendicitis.     Plan    He was advised in a high-fiber, low-fat, forced hydration diet. I gave him patient information booklets on diverticulitis and this diet specifically.  He was encouraged to  to lose weight  I think that he needs further evaluation. He will be scheduled for a CT scan of the abdomen and pelvis to make sure that his abscess is resolved.  He will be referred to gastroenterology for eventual colonoscopy to rule out cancer  We will try to get him established with a PCP, [facility].  He is asked to return to see me in 10 weeks.          Angelia Mould. Derrell Lolling, M.D., [facility]  [facility] General and Minimally invasive Surgery Breast and Colorectal Surgery Office:   [phone] Pager:   [phone]  12/16/2012, 12:16 PM

## 2012-12-16 NOTE — Patient Instructions (Signed)
You are recovering from your episode of severe diverticulitis with microperforation.  You are advised to drink 7 or 8 glasses of water a day, switch  diet to high fiber /low fat.  You are strongly urged to lose weight.  Youwill be scheduled for a CT scan of the abdomen and pelvis to make sure that the colon is healing properly  You'll be referred to gastroenterology for colonoscopy.  Return to see Dr. Derrell Lolling in approximately 10 weeks.

## 2012-12-17 ENCOUNTER — Ambulatory Visit
Admission: RE | Admit: 2012-12-17 | Discharge: 2012-12-17 | Disposition: A | Discharge status: Home / Self Care | Payer: Medicaid Other | Source: Ambulatory Visit | Attending: General Surgery | Admitting: General Surgery

## 2012-12-17 DIAGNOSIS — K5732 Diverticulitis of large intestine without perforation or abscess without bleeding: Secondary | ICD-10-CM

## 2012-12-17 MED ORDER — IOHEXOL 300 MG/ML  SOLN
125.0000 mL | Freq: Once | INTRAMUSCULAR | Status: AC | PRN
  Administered 2012-12-17: 125 mL via INTRAVENOUS

## 2012-12-19 ENCOUNTER — Telehealth (INDEPENDENT_AMBULATORY_CARE_PROVIDER_SITE_OTHER): Payer: Self-pay

## 2012-12-19 NOTE — Telephone Encounter (Signed)
I called and left a message for the pt to call.   I wanted to give him the ct results.  We had left a message for [facility] to try to get him in there for primary care.  He has medicaid [place] [employer] and we can't refer him directly to GI.  He needs a primary care to refer him.

## 2012-12-19 NOTE — Telephone Encounter (Signed)
Message copied by Ivory Broad on Fri Dec 19, 2012 10:07 AM ------      Message from: Ernestene Mention      Created: Wed Dec 17, 2012  5:44 PM       Call radiology reports to patient. "Looks better" Proceed with referral to GI. ------

## 2012-12-29 ENCOUNTER — Telehealth (INDEPENDENT_AMBULATORY_CARE_PROVIDER_SITE_OTHER): Payer: Self-pay | Admitting: General Surgery

## 2012-12-29 ENCOUNTER — Inpatient Hospital Stay (HOSPITAL_COMMUNITY)
Admission: EM | Admit: 2012-12-29 | Discharge: 2013-01-01 | DRG: 392 | Disposition: A | Discharge status: Home / Self Care | Payer: Medicaid Other | Attending: Internal Medicine | Admitting: Internal Medicine

## 2012-12-29 ENCOUNTER — Emergency Department (HOSPITAL_COMMUNITY): Payer: Medicaid Other

## 2012-12-29 ENCOUNTER — Telehealth (INDEPENDENT_AMBULATORY_CARE_PROVIDER_SITE_OTHER): Payer: Self-pay

## 2012-12-29 ENCOUNTER — Encounter (HOSPITAL_COMMUNITY): Payer: Self-pay | Admitting: *Deleted

## 2012-12-29 DIAGNOSIS — F1010 Alcohol abuse, uncomplicated: Secondary | ICD-10-CM | POA: Diagnosis present

## 2012-12-29 DIAGNOSIS — I10 Essential (primary) hypertension: Secondary | ICD-10-CM | POA: Diagnosis present

## 2012-12-29 DIAGNOSIS — F17200 Nicotine dependence, unspecified, uncomplicated: Secondary | ICD-10-CM | POA: Diagnosis present

## 2012-12-29 DIAGNOSIS — M109 Gout, unspecified: Secondary | ICD-10-CM | POA: Diagnosis present

## 2012-12-29 DIAGNOSIS — Z720 Tobacco use: Secondary | ICD-10-CM | POA: Diagnosis present

## 2012-12-29 DIAGNOSIS — R112 Nausea with vomiting, unspecified: Secondary | ICD-10-CM

## 2012-12-29 DIAGNOSIS — I161 Hypertensive emergency: Secondary | ICD-10-CM | POA: Diagnosis present

## 2012-12-29 DIAGNOSIS — Z79899 Other long term (current) drug therapy: Secondary | ICD-10-CM

## 2012-12-29 DIAGNOSIS — D72829 Elevated white blood cell count, unspecified: Secondary | ICD-10-CM

## 2012-12-29 DIAGNOSIS — K5732 Diverticulitis of large intestine without perforation or abscess without bleeding: Principal | ICD-10-CM | POA: Diagnosis present

## 2012-12-29 DIAGNOSIS — K5792 Diverticulitis of intestine, part unspecified, without perforation or abscess without bleeding: Secondary | ICD-10-CM | POA: Diagnosis present

## 2012-12-29 LAB — CBC WITH DIFFERENTIAL/PLATELET
Eosinophils Absolute: 0.2 10*3/uL (ref 0.0–0.7)
Eosinophils Relative: 1 % (ref 0–5)
HCT: 41.7 % (ref 39.0–52.0)
Lymphocytes Relative: 17 % (ref 12–46)
Lymphs Abs: 2.3 10*3/uL (ref 0.7–4.0)
MCH: 30.2 pg (ref 26.0–34.0)
MCV: 90.7 fL (ref 78.0–100.0)
Monocytes Absolute: 0.6 10*3/uL (ref 0.1–1.0)
RBC: 4.6 MIL/uL (ref 4.22–5.81)
WBC: 13.4 10*3/uL — ABNORMAL HIGH (ref 4.0–10.5)

## 2012-12-29 LAB — COMPREHENSIVE METABOLIC PANEL
ALT: 29 U/L (ref 0–53)
AST: 25 U/L (ref 0–37)
Albumin: 4.5 g/dL (ref 3.5–5.2)
CO2: 27 mEq/L (ref 19–32)
Chloride: 98 mEq/L (ref 96–112)
Creatinine, Ser: 0.68 mg/dL (ref 0.50–1.35)
GFR calc non Af Amer: >90 mL/min (ref >90)
Potassium: 4.4 mEq/L (ref 3.5–5.1)
Sodium: 139 mEq/L (ref 135–145)
Total Bilirubin: 0.6 mg/dL (ref 0.3–1.2)

## 2012-12-29 LAB — URINALYSIS, ROUTINE W REFLEX MICROSCOPIC
Bilirubin Urine: NEGATIVE
Glucose, UA: NEGATIVE mg/dL
Hgb urine dipstick: NEGATIVE
Protein, ur: NEGATIVE mg/dL

## 2012-12-29 MED ORDER — MORPHINE SULFATE 4 MG/ML IJ SOLN
4.0000 mg | INTRAMUSCULAR | Status: DC | PRN
  Filled 2012-12-29: qty 1

## 2012-12-29 MED ORDER — ONDANSETRON HCL 4 MG/2ML IJ SOLN: 4.0000 mg | Freq: Three times a day (TID) | INTRAMUSCULAR | Status: DC | PRN

## 2012-12-29 MED ORDER — CIPROFLOXACIN IN D5W 400 MG/200ML IV SOLN
400.0000 mg | Freq: Once | INTRAVENOUS | Status: AC
  Administered 2012-12-30: 400 mg via INTRAVENOUS
  Filled 2012-12-29: qty 200

## 2012-12-29 MED ORDER — IOHEXOL 300 MG/ML  SOLN
25.0000 mL | INTRAMUSCULAR | Status: AC
  Administered 2012-12-29 (×2): 25 mL via ORAL

## 2012-12-29 MED ORDER — SODIUM CHLORIDE 0.9 % IV SOLN
INTRAVENOUS | Status: DC
  Administered 2012-12-29: via INTRAVENOUS

## 2012-12-29 MED ORDER — IOHEXOL 300 MG/ML  SOLN
100.0000 mL | Freq: Once | INTRAMUSCULAR | Status: AC | PRN
  Administered 2012-12-29: 100 mL via INTRAVENOUS

## 2012-12-29 MED ORDER — METRONIDAZOLE IN NACL 5-0.79 MG/ML-% IV SOLN
500.0000 mg | Freq: Once | INTRAVENOUS | Status: DC
  Filled 2012-12-29: qty 100

## 2012-12-29 NOTE — ED Notes (Signed)
General Surgery MD at bedside.

## 2012-12-29 NOTE — ED Notes (Signed)
Pt reports being diagnosed with diverticulitis approximately 2 months ago and was admitted to the hospital for iv abx treatment. Pt left prior to full administration and was given oral abx to take at home. Surgeon wanted to see if colon could heal on its own so that pt would not have to have a colostomy. Pt states that pain has not gone away, that he has gotten more distended like when he had an infection in his colon 2 months ago. PCP told pt to come to the ER for further evaluation.

## 2012-12-29 NOTE — ED Provider Notes (Signed)
History     CSN: [id number]  Arrival date & time 12/29/12  1514   First MD Initiated Contact with Patient 12/29/12 1600      Chief Complaint  Patient presents with  . Abdominal Pain    (Consider location/radiation/quality/duration/timing/severity/associated sxs/prior treatment) HPI  Patient reports he had been having intermittent episodes of abdominal pain for a while however he had his first documented episode of diverticulitis on April 17. He was admitted to the hospital for an abscess with perforation. He was admitted for 6-7 days. He states they were considering going to the OR for colostomy however he was treated with IV antibiotics. He was seen about 2 weeks ago by his surgeon and had a repeat CT scan which he states showed  some persistant edema. He reports he has finished antibiotics however he continues to have pain. He indicates his worse pain is in the suprapubic area but also in the left lower and right lower quadrant. He states he has a constant feeling that he needs to urinate described as a pressure. He is unsure of fever but his wife said he felt hot last night. He has had nausea with vomiting that started 2 nights ago and he vomited once today. He has had soft stools since he changed his diet and he had 4-5 soft stools today. He states he feels weak but denies feeling dizzy. He states his abdomen feels swollen. He states they called the surgeon's office today and they were told to come to the ED.  Patient reports he was admitted to the hospital about 2 months ago for acute gout flareup.  PCP was [facility] Has first appt with [facility] on 7/8 Surgery Dr Derrell Lolling  Past Medical History  Diagnosis Date  . Gout   . Hypertension   . Diverticulitis     Past Surgical History  Procedure Laterality Date  . Appendectomy    . Mouth surgery  2010    Family History  Problem Relation Age of Onset  . Cancer Father     History  Substance Use  Topics  . Smoking status: Current Every Day Smoker -- 0.25 packs/day    Types: Cigarettes  . Smokeless tobacco: Not on file  . Alcohol Use: Yes     Comment: nothing in 6weeks   Lives at home Lives with spouse Unemployed Smokes 1 ppd down from 2 1/2 PPD   Review of Systems  All other systems reviewed and are negative.    Allergies  Review of patient's allergies indicates no known allergies.  Home Medications   Current Outpatient Rx  Name  Route  Sig  Dispense  Refill  . albuterol (PROVENTIL HFA;VENTOLIN HFA) 108 (90 BASE) MCG/ACT inhaler   Inhalation   Inhale 2 puffs into the lungs every 6 (six) hours as needed for wheezing.         [name] allopurinol (ZYLOPRIM) 300 MG tablet   Oral   Take 300 mg by mouth at bedtime.         . colchicine 0.6 MG tablet   Oral   Take 0.6 mg by mouth every 3 (three) hours as needed. For gout flare up         . ibuprofen (ADVIL,MOTRIN) 200 MG tablet   Oral   Take 800 mg by mouth every 6 (six) hours as needed for pain. For pain         . indomethacin (INDOCIN) 25 MG capsule   Oral  Take 25 mg by mouth 3 (three) times daily as needed. For gout flare up           BP 153/110  Pulse 91  Temp(Src) 98.8 F (37.1 C) (Oral)  Resp 17  SpO2 97%  Vital signs normal    Physical Exam  Nursing note and vitals reviewed. Constitutional: He is oriented to person, place, and time. He appears well-developed and well-nourished.  Non-toxic appearance. He does not appear ill. No distress.  HENT:  Head: Normocephalic and atraumatic.  Right Ear: External ear normal.  Left Ear: External ear normal.  Nose: Nose normal. No mucosal edema or rhinorrhea.  Mouth/Throat: Oropharynx is clear and moist and mucous membranes are normal. No dental abscesses or edematous.  Poor dentition  Eyes: Conjunctivae and EOM are normal. Pupils are equal, round, and reactive to light.  Neck: Normal range of motion and full passive range of motion without pain.  Neck supple.  Cardiovascular: Normal rate, regular rhythm and normal heart sounds.  Exam reveals no gallop and no friction rub.   No murmur heard. Pulmonary/Chest: Effort normal and breath sounds normal. No respiratory distress. He has no wheezes. He has no rhonchi. He has no rales. He exhibits no tenderness and no crepitus.  Abdominal: Soft. Normal appearance and bowel sounds are normal. He exhibits no distension. There is tenderness. There is no rebound and no guarding.    Musculoskeletal: Normal range of motion. He exhibits no edema and no tenderness.  Moves all extremities well.   Neurological: He is alert and oriented to person, place, and time. He has normal strength. No cranial nerve deficit.  Skin: Skin is warm, dry and intact. No rash noted. No erythema. No pallor.  Psychiatric: He has a normal mood and affect. His speech is normal and behavior is normal. His mood appears not anxious.    ED Course  Procedures (including critical care time)  Medications  ciprofloxacin (CIPRO) IVPB 400 mg (not administered)  metroNIDAZOLE (FLAGYL) IVPB 500 mg (not administered)  iohexol (OMNIPAQUE) 300 MG/ML solution 25 mL (25 mLs Oral Contrast Given 12/29/12 1830)  iohexol (OMNIPAQUE) 300 MG/ML solution 100 mL (100 mLs Intravenous Contrast Given 12/29/12 1937)   Patient given results of his CT scan.  Review of chart shows patient was discharged on April 17.  2030 8 PM patient discussed with Dr. Janee Morn. He asked to have possible segment patient and he will do consult.  22:20 Dr Toniann Fail, admit to med-surg, team 10, asks to start cipro and flagyl  Results for orders placed during the hospital encounter of 12/29/12  COMPREHENSIVE METABOLIC PANEL      Result Value Range   Sodium 139  135 - 145 mEq/L   Potassium 4.4  3.5 - 5.1 mEq/L   Chloride 98  96 - 112 mEq/L   CO2 27  19 - 32 mEq/L   Glucose, Bld 91  70 - 99 mg/dL   BUN 6  6 - 23 mg/dL   Creatinine, Ser 7.84  0.50 - 1.35 mg/dL    Calcium 69.6  8.4 - 10.5 mg/dL   Total Protein 7.9  6.0 - 8.3 g/dL   Albumin 4.5  3.5 - 5.2 g/dL   AST 25  0 - 37 U/L   ALT 29  0 - 53 U/L   Alkaline Phosphatase 69  39 - 117 U/L   Total Bilirubin 0.6  0.3 - 1.2 mg/dL   GFR calc non Af Amer >90  >90 mL/min  GFR calc Af Amer >90  >90 mL/min  URINALYSIS, ROUTINE W REFLEX MICROSCOPIC      Result Value Range   Color, Urine YELLOW  YELLOW   APPearance CLEAR  CLEAR   Specific Gravity, Urine 1.011  1.005 - 1.030   pH 7.0  5.0 - 8.0   Glucose, UA NEGATIVE  NEGATIVE mg/dL   Hgb urine dipstick NEGATIVE  NEGATIVE   Bilirubin Urine NEGATIVE  NEGATIVE   Ketones, ur NEGATIVE  NEGATIVE mg/dL   Protein, ur NEGATIVE  NEGATIVE mg/dL   Urobilinogen, UA 0.2  0.0 - 1.0 mg/dL   Nitrite NEGATIVE  NEGATIVE   Leukocytes, UA NEGATIVE  NEGATIVE  CBC WITH DIFFERENTIAL      Result Value Range   WBC 13.4 (*) 4.0 - 10.5 K/uL   RBC 4.60  4.22 - 5.81 MIL/uL   Hemoglobin 13.9  13.0 - 17.0 g/dL   HCT 16.1  09.6 - 04.5 %   MCV 90.7  78.0 - 100.0 fL   MCH 30.2  26.0 - 34.0 pg   MCHC 33.3  30.0 - 36.0 g/dL   RDW 40.9  81.1 - 91.4 %   Platelets 271  150 - 400 K/uL   Neutrophils Relative % 77  43 - 77 %   Neutro Abs 10.3 (*) 1.7 - 7.7 K/uL   Lymphocytes Relative 17  12 - 46 %   Lymphs Abs 2.3  0.7 - 4.0 K/uL   Monocytes Relative 5  3 - 12 %   Monocytes Absolute 0.6  0.1 - 1.0 K/uL   Eosinophils Relative 1  0 - 5 %   Eosinophils Absolute 0.2  0.0 - 0.7 K/uL   Basophils Relative 0  0 - 1 %   Basophils Absolute 0.0  0.0 - 0.1 K/uL    Laboratory interpretation all normal except leukocytosis  Ct Abdomen Pelvis W Contrast  12/29/2012   *RADIOLOGY REPORT*  Clinical Data: Persistent abdominal pain, recent diverticulitis with abscess  CT ABDOMEN AND PELVIS WITH CONTRAST  Technique:  Multidetector CT imaging of the abdomen and pelvis was performed following the standard protocol during bolus administration of intravenous contrast.  Contrast: OMNIPAQUE IOHEXOL  300 MG/ML  SOLN  Comparison: Most recent prior CT abdomen/pelvis 12/17/2012  Findings:  Lower Chest:  The lung bases are clear.  Visualized cardiac structures are within normal limits for size.  No pericardial effusion.  The distal thoracic esophagus is unremarkable.  Abdomen: Unremarkable CT appearance of the stomach, duodenum, spleen, adrenal glands and pancreas.  Normal hepatic contour without focal lesion. Gallbladder is unremarkable. No intra or extrahepatic biliary ductal dilatation.  Symmetric renal parenchymal enhancement bilaterally.  No hydronephrosis or nephrolithiasis.  No enhancing renal mass.  Normal-caliber large and small bowel throughout the abdomen.  No evidence of obstruction.  Persistent submucosal thickening and inflammation of the pericolonic fat in the sigmoid colon consistent with diverticulitis.  The previously identified pericolonic abscess continues to resolve and is now barely discernible.  Retained oral contrast material is noted within several sigmoid colonic diverticula.  The degree of inflammatory change is very similar compared to 12/17/2012.  The remainder of the visualized colon is unremarkable.  No free fluid or new suspicious adenopathy. Retroperitoneal lymph node at the aortic bifurcation again measures 10 mm in short axis.  Pelvis: The bladder is distended with urine.  Unremarkable seminal vesicles and prostate gland.  Bones: No acute fracture or aggressive appearing lytic or blastic osseous lesion.  Right hip osteoarthritis.  Vascular: Trace vascular calcifications without aneurysmal dilatation or focal narrowing.  IMPRESSION:  Persistent versus recurrent sigmoid colitis.  The previously identified pericolic abscess continues to resolve and is barely identifiable on the current examination. The degree of colonic wall thickening and pericolonic inflammation is very similar compared to 12/17/2012.  Although persistent or recurrent diverticulitis is most likely, consideration  should be given to inflammatory colitis, and potentially underlying malignancy given the persistence of the findings.   Original Report Authenticated By: Malachy Moan, M.D.    Ct Abdomen Pelvis W Contrast  12/17/2012   IMPRESSION:  1.  Significant improvement in the previously described diverticulitis of the rectosigmoid colon.  Residual mucosal edema is present but the adjacent inflammatory process has significantly improved. 2.  Decrease in size of the pericolonic abscess as noted above. 3.  Degenerative joint disease of the hips, right greater than left.   Original Report Authenticated By: Dwyane Dee, M.D.        1. Nausea and vomiting in adult   2. Diverticulitis     Plan admission   Devoria Albe, MD, FACEP   MDM          Ward Givens, MD 12/29/12 2224

## 2012-12-29 NOTE — ED Notes (Signed)
Informed CT that pt was done with one cup of contrast.

## 2012-12-29 NOTE — Consult Note (Signed)
Reason for Consult:Diverticulitis Referring Physician: Vivan Agostino is an 42 y.o. male.  HPI: Patient was recently hospitalized in April for perforated sigmoid diverticulitis with abscess. He was treated medically and improved. He followed up with Dr. Derrell Lolling from our practice in the office 12/16/12 . He was still having some pain at that time.  This pain has been gradually improving up until 24-48 hours ago. He developed increasing suprapubic pain as well as nausea and vomiting after eating. He had several bowel movements yesterday which were soft but not diarrhea. No blood per stool. He came to emergency department for further evaluation because she was worried his to articulate this was coming back. Of note, in the interim, he has obtained an appointment with a primary care physician in July.  Past Medical History  Diagnosis Date  . Gout   . Hypertension   . Diverticulitis     Past Surgical History  Procedure Laterality Date  . Appendectomy    . Mouth surgery  2010    Family History  Problem Relation Age of Onset  . Cancer Father     Social History:  reports that he has been smoking Cigarettes.  He has been smoking about 0.25 packs per day. He does not have any smokeless tobacco history on file. He reports that  drinks alcohol. He reports that he does not use illicit drugs.  Allergies: No Known Allergies  Medications: Prior to Admission:  (Not in a hospital admission)  Results for orders placed during the hospital encounter of 12/29/12 (from the past 48 hour(s))  COMPREHENSIVE METABOLIC PANEL     Status: None   Collection Time    12/29/12  3:27 PM      Result Value Range   Sodium 139  135 - 145 mEq/L   Potassium 4.4  3.5 - 5.1 mEq/L   Chloride 98  96 - 112 mEq/L   CO2 27  19 - 32 mEq/L   Glucose, Bld 91  70 - 99 mg/dL   BUN 6  6 - 23 mg/dL   Creatinine, Ser 1.61  0.50 - 1.35 mg/dL   Calcium 09.6  8.4 - 04.5 mg/dL   Total Protein 7.9  6.0 - 8.3 g/dL    Albumin 4.5  3.5 - 5.2 g/dL   AST 25  0 - 37 U/L   ALT 29  0 - 53 U/L   Alkaline Phosphatase 69  39 - 117 U/L   Total Bilirubin 0.6  0.3 - 1.2 mg/dL   GFR calc non Af Amer >90  >90 mL/min   GFR calc Af Amer >90  >90 mL/min   Comment:            The eGFR has been calculated     using the CKD EPI equation.     This calculation has not been     validated in all clinical     situations.     eGFR's persistently     <90 mL/min signify     possible Chronic Kidney Disease.  URINALYSIS, ROUTINE W REFLEX MICROSCOPIC     Status: None   Collection Time    12/29/12  4:06 PM      Result Value Range   Color, Urine YELLOW  YELLOW   APPearance CLEAR  CLEAR   Specific Gravity, Urine 1.011  1.005 - 1.030   pH 7.0  5.0 - 8.0   Glucose, UA NEGATIVE  NEGATIVE mg/dL   Hgb urine dipstick  NEGATIVE  NEGATIVE   Bilirubin Urine NEGATIVE  NEGATIVE   Ketones, ur NEGATIVE  NEGATIVE mg/dL   Protein, ur NEGATIVE  NEGATIVE mg/dL   Urobilinogen, UA 0.2  0.0 - 1.0 mg/dL   Nitrite NEGATIVE  NEGATIVE   Leukocytes, UA NEGATIVE  NEGATIVE   Comment: MICROSCOPIC NOT DONE ON URINES WITH NEGATIVE PROTEIN, BLOOD, LEUKOCYTES, NITRITE, OR GLUCOSE <1000 mg/dL.  CBC WITH DIFFERENTIAL     Status: Abnormal   Collection Time    12/29/12  6:10 PM      Result Value Range   WBC 13.4 (*) 4.0 - 10.5 K/uL   RBC 4.60  4.22 - 5.81 MIL/uL   Hemoglobin 13.9  13.0 - 17.0 g/dL   HCT 95.6  21.3 - 08.6 %   MCV 90.7  78.0 - 100.0 fL   MCH 30.2  26.0 - 34.0 pg   MCHC 33.3  30.0 - 36.0 g/dL   RDW 57.8  46.9 - 62.9 %   Platelets 271  150 - 400 K/uL   Neutrophils Relative % 77  43 - 77 %   Neutro Abs 10.3 (*) 1.7 - 7.7 K/uL   Lymphocytes Relative 17  12 - 46 %   Lymphs Abs 2.3  0.7 - 4.0 K/uL   Monocytes Relative 5  3 - 12 %   Monocytes Absolute 0.6  0.1 - 1.0 K/uL   Eosinophils Relative 1  0 - 5 %   Eosinophils Absolute 0.2  0.0 - 0.7 K/uL   Basophils Relative 0  0 - 1 %   Basophils Absolute 0.0  0.0 - 0.1 K/uL    Ct Abdomen  Pelvis W Contrast  12/29/2012   *RADIOLOGY REPORT*  Clinical Data: Persistent abdominal pain, recent diverticulitis with abscess  CT ABDOMEN AND PELVIS WITH CONTRAST  Technique:  Multidetector CT imaging of the abdomen and pelvis was performed following the standard protocol during bolus administration of intravenous contrast.  Contrast: OMNIPAQUE IOHEXOL 300 MG/ML  SOLN  Comparison: Most recent prior CT abdomen/pelvis 12/17/2012  Findings:  Lower Chest:  The lung bases are clear.  Visualized cardiac structures are within normal limits for size.  No pericardial effusion.  The distal thoracic esophagus is unremarkable.  Abdomen: Unremarkable CT appearance of the stomach, duodenum, spleen, adrenal glands and pancreas.  Normal hepatic contour without focal lesion. Gallbladder is unremarkable. No intra or extrahepatic biliary ductal dilatation.  Symmetric renal parenchymal enhancement bilaterally.  No hydronephrosis or nephrolithiasis.  No enhancing renal mass.  Normal-caliber large and small bowel throughout the abdomen.  No evidence of obstruction.  Persistent submucosal thickening and inflammation of the pericolonic fat in the sigmoid colon consistent with diverticulitis.  The previously identified pericolonic abscess continues to resolve and is now barely discernible.  Retained oral contrast material is noted within several sigmoid colonic diverticula.  The degree of inflammatory change is very similar compared to 12/17/2012.  The remainder of the visualized colon is unremarkable.  No free fluid or new suspicious adenopathy. Retroperitoneal lymph node at the aortic bifurcation again measures 10 mm in short axis.  Pelvis: The bladder is distended with urine.  Unremarkable seminal vesicles and prostate gland.  Bones: No acute fracture or aggressive appearing lytic or blastic osseous lesion.  Right hip osteoarthritis.  Vascular: Trace vascular calcifications without aneurysmal dilatation or focal narrowing.   IMPRESSION:  Persistent versus recurrent sigmoid colitis.  The previously identified pericolic abscess continues to resolve and is barely identifiable on the current examination. The degree  of colonic wall thickening and pericolonic inflammation is very similar compared to 12/17/2012.  Although persistent or recurrent diverticulitis is most likely, consideration should be given to inflammatory colitis, and potentially underlying malignancy given the persistence of the findings.   Original Report Authenticated By: Malachy Moan, M.D.    Review of Systems  Constitutional: Negative for fever and chills.  HENT: Negative.   Eyes: Negative.   Respiratory: Negative.   Cardiovascular: Negative.   Gastrointestinal: Positive for nausea, vomiting and abdominal pain. Negative for constipation and blood in stool.  Genitourinary: Negative.   Musculoskeletal:       Chronic pain in joints and muscles of bilateral lower extremities  Skin: Negative.   Neurological: Negative.   Endo/Heme/Allergies: Negative.   Psychiatric/Behavioral: Negative.    Blood pressure 170/107, pulse 87, temperature 98.8 F (37.1 C), temperature source Oral, resp. rate 19, SpO2 99.00%. Physical Exam  Constitutional: He is oriented to person, place, and time. He appears well-developed and well-nourished. No distress.  HENT:  Head: Normocephalic and atraumatic.  Mouth/Throat: Oropharynx is clear and moist. No oropharyngeal exudate.  Poor dentition  Eyes: Conjunctivae and EOM are normal. Pupils are equal, round, and reactive to light. No scleral icterus.  Neck: Normal range of motion. Neck supple. No tracheal deviation present.  Cardiovascular: Normal rate, regular rhythm, normal heart sounds and intact distal pulses.   No murmur heard. Respiratory: Effort normal and breath sounds normal. No stridor. No respiratory distress. He has no wheezes. He has no rales.  GI: Soft. He exhibits no distension. There is tenderness. There is  no rebound and no guarding.  Minimal left lower quadrant and suprapubic tenderness to deep palpation, no generalized tenderness, no peritoneal signs, positive bowel sounds  Musculoskeletal: Normal range of motion. He exhibits no edema.  Neurological: He is alert and oriented to person, place, and time.  Skin: Skin is warm.    Assessment/Plan: Persistent versus recurrent sigmoid diverticulitis with near complete resolution of recent abscess: agree with medical admission and bowel rest. Recommend IV zosyn. No need for emergent surgical intervention at this time but we will follow him closely with you. He also complains of some chronic muscular pain in his legs. I suggested he discuss this with the admitting team. This may warrant further workup once he's an outpatient.  Deion Forgue E 12/29/2012, 9:31 PM

## 2012-12-29 NOTE — Telephone Encounter (Signed)
Patient calling into office to see if we have scheduled patient's referral to GI.  Patient [place] [employer] and and per [place] [employer] he need's a referral from his PCP.  Patient reports that he does not have a PCP at this time.  Patient aware that he will need a primary care physician to make the referral to GI.

## 2012-12-29 NOTE — Telephone Encounter (Signed)
Wife called to ask advice on how to handle current change in symptoms.  Pt was seen while an in-patient by Dr. Derrell Lolling, but no surgery was performed.  Dr. Derrell Lolling had called them to discuss CT results and promote his being seen by his PCP as soon as possible.  She called today to report they have a new-patient appt with a family practitioner in July, but his urine has become dark again and pain is back.  Recommended she take him to the ED for workup and pain management for now.  No need to come see the surgeon at this time.  She understands and appreciates the advice.

## 2012-12-29 NOTE — ED Notes (Signed)
Admitted for severe diverticulitis and was treated with anbx 6 weeks ago.  Pt now with lower abdominal pain that started over the last week.  Surgeon referred patient back here.

## 2012-12-29 NOTE — Telephone Encounter (Signed)
Spoke with patient he is aware of appt 02/03/13 with a PCP [facility] he will see  [facility] [address] telephone [phone] .

## 2012-12-30 ENCOUNTER — Encounter (HOSPITAL_COMMUNITY): Payer: Self-pay | Admitting: Internal Medicine

## 2012-12-30 DIAGNOSIS — I10 Essential (primary) hypertension: Secondary | ICD-10-CM

## 2012-12-30 DIAGNOSIS — R112 Nausea with vomiting, unspecified: Secondary | ICD-10-CM

## 2012-12-30 DIAGNOSIS — K5732 Diverticulitis of large intestine without perforation or abscess without bleeding: Principal | ICD-10-CM

## 2012-12-30 DIAGNOSIS — K5792 Diverticulitis of intestine, part unspecified, without perforation or abscess without bleeding: Secondary | ICD-10-CM | POA: Diagnosis present

## 2012-12-30 DIAGNOSIS — M109 Gout, unspecified: Secondary | ICD-10-CM

## 2012-12-30 DIAGNOSIS — M79609 Pain in unspecified limb: Secondary | ICD-10-CM

## 2012-12-30 DIAGNOSIS — Z720 Tobacco use: Secondary | ICD-10-CM | POA: Diagnosis present

## 2012-12-30 LAB — COMPREHENSIVE METABOLIC PANEL
ALT: 22 U/L (ref 0–53)
AST: 16 U/L (ref 0–37)
Albumin: 3.6 g/dL (ref 3.5–5.2)
Alkaline Phosphatase: 58 U/L (ref 39–117)
CO2: 26 mEq/L (ref 19–32)
Chloride: 102 mEq/L (ref 96–112)
Creatinine, Ser: 0.61 mg/dL (ref 0.50–1.35)
GFR calc non Af Amer: >90 mL/min (ref >90)
Potassium: 3.3 mEq/L — ABNORMAL LOW (ref 3.5–5.1)
Sodium: 138 mEq/L (ref 135–145)
Total Bilirubin: 0.7 mg/dL (ref 0.3–1.2)

## 2012-12-30 LAB — CBC WITH DIFFERENTIAL/PLATELET
Basophils Relative: 0 % (ref 0–1)
Eosinophils Absolute: 0.2 10*3/uL (ref 0.0–0.7)
Eosinophils Relative: 3 % (ref 0–5)
MCH: 30.5 pg (ref 26.0–34.0)
MCHC: 33.8 g/dL (ref 30.0–36.0)
MCV: 90.3 fL (ref 78.0–100.0)
Neutrophils Relative %: 66 % (ref 43–77)
Platelets: 256 10*3/uL (ref 150–400)
RDW: 13.4 % (ref 11.5–15.5)

## 2012-12-30 LAB — GLUCOSE, CAPILLARY: Glucose-Capillary: 100 mg/dL — ABNORMAL HIGH (ref 70–99)

## 2012-12-30 LAB — CLOSTRIDIUM DIFFICILE BY PCR: Toxigenic C. Difficile by PCR: NEGATIVE

## 2012-12-30 MED ORDER — LORAZEPAM 2 MG/ML IJ SOLN: 0.0000 mg | Freq: Two times a day (BID) | INTRAMUSCULAR | Status: DC

## 2012-12-30 MED ORDER — FOLIC ACID 1 MG PO TABS
1.0000 mg | ORAL_TABLET | Freq: Every day | ORAL | Status: DC
  Administered 2012-12-30 – 2013-01-01 (×3): 1 mg via ORAL
  Filled 2012-12-30 (×3): qty 1

## 2012-12-30 MED ORDER — ACETAMINOPHEN 325 MG PO TABS: 650.0000 mg | ORAL_TABLET | Freq: Four times a day (QID) | ORAL | Status: DC | PRN

## 2012-12-30 MED ORDER — PIPERACILLIN-TAZOBACTAM 3.375 G IVPB
3.3750 g | Freq: Three times a day (TID) | INTRAVENOUS | Status: DC
  Administered 2012-12-30 – 2013-01-01 (×8): 3.375 g via INTRAVENOUS
  Filled 2012-12-30 (×9): qty 50

## 2012-12-30 MED ORDER — ALLOPURINOL 300 MG PO TABS
300.0000 mg | ORAL_TABLET | Freq: Every day | ORAL | Status: DC
  Administered 2012-12-30 – 2012-12-31 (×3): 300 mg via ORAL
  Filled 2012-12-30 (×4): qty 1

## 2012-12-30 MED ORDER — HYDRALAZINE HCL 20 MG/ML IJ SOLN
10.0000 mg | INTRAMUSCULAR | Status: DC | PRN
  Administered 2012-12-30: 10 mg via INTRAVENOUS
  Filled 2012-12-30: qty 1

## 2012-12-30 MED ORDER — ALLOPURINOL 300 MG PO TABS: 300.0000 mg | ORAL_TABLET | Freq: Every day | ORAL | Status: DC

## 2012-12-30 MED ORDER — COLCHICINE 0.6 MG PO TABS
0.6000 mg | ORAL_TABLET | ORAL | Status: DC | PRN
  Administered 2013-01-01: 0.6 mg via ORAL
  Filled 2012-12-30: qty 1

## 2012-12-30 MED ORDER — HYDROMORPHONE HCL PF 1 MG/ML IJ SOLN
1.0000 mg | INTRAMUSCULAR | Status: DC | PRN
  Administered 2012-12-30 – 2013-01-01 (×13): 1 mg via INTRAVENOUS
  Filled 2012-12-30 (×13): qty 1

## 2012-12-30 MED ORDER — VITAMIN B-1 100 MG PO TABS
100.0000 mg | ORAL_TABLET | Freq: Every day | ORAL | Status: DC
  Administered 2012-12-30 – 2013-01-01 (×3): 100 mg via ORAL
  Filled 2012-12-30 (×3): qty 1

## 2012-12-30 MED ORDER — ONDANSETRON HCL 4 MG PO TABS: 4.0000 mg | ORAL_TABLET | Freq: Four times a day (QID) | ORAL | Status: DC | PRN

## 2012-12-30 MED ORDER — ACETAMINOPHEN 650 MG RE SUPP: 650.0000 mg | Freq: Four times a day (QID) | RECTAL | Status: DC | PRN

## 2012-12-30 MED ORDER — ONDANSETRON HCL 4 MG/2ML IJ SOLN: 4.0000 mg | Freq: Four times a day (QID) | INTRAMUSCULAR | Status: DC | PRN

## 2012-12-30 MED ORDER — LORAZEPAM 2 MG/ML IJ SOLN: 0.0000 mg | Freq: Four times a day (QID) | INTRAMUSCULAR | Status: AC

## 2012-12-30 MED ORDER — ALBUTEROL SULFATE HFA 108 (90 BASE) MCG/ACT IN AERS: 2.0000 | INHALATION_SPRAY | Freq: Four times a day (QID) | RESPIRATORY_TRACT | Status: DC | PRN

## 2012-12-30 MED ORDER — MORPHINE SULFATE 2 MG/ML IJ SOLN: 2.0000 mg | INTRAMUSCULAR | Status: DC | PRN

## 2012-12-30 MED ORDER — ADULT MULTIVITAMIN W/MINERALS CH
1.0000 | ORAL_TABLET | Freq: Every day | ORAL | Status: DC
  Administered 2012-12-30 – 2013-01-01 (×3): 1 via ORAL
  Filled 2012-12-30 (×3): qty 1

## 2012-12-30 MED ORDER — LORAZEPAM 1 MG PO TABS: 1.0000 mg | ORAL_TABLET | Freq: Four times a day (QID) | ORAL | Status: DC | PRN

## 2012-12-30 MED ORDER — LORAZEPAM 2 MG/ML IJ SOLN: 1.0000 mg | Freq: Four times a day (QID) | INTRAMUSCULAR | Status: DC | PRN

## 2012-12-30 MED ORDER — PANTOPRAZOLE SODIUM 40 MG PO TBEC
40.0000 mg | DELAYED_RELEASE_TABLET | Freq: Every day | ORAL | Status: DC
  Administered 2012-12-30 – 2012-12-31 (×2): 40 mg via ORAL
  Filled 2012-12-30 (×3): qty 1

## 2012-12-30 MED ORDER — THIAMINE HCL 100 MG/ML IJ SOLN
100.0000 mg | Freq: Every day | INTRAMUSCULAR | Status: DC
  Filled 2012-12-30 (×3): qty 1

## 2012-12-30 MED ORDER — SODIUM CHLORIDE 0.9 % IV SOLN
INTRAVENOUS | Status: AC
  Administered 2012-12-30 (×2): via INTRAVENOUS

## 2012-12-30 MED ORDER — POTASSIUM CHLORIDE CRYS ER 20 MEQ PO TBCR
40.0000 meq | EXTENDED_RELEASE_TABLET | Freq: Four times a day (QID) | ORAL | Status: AC
  Administered 2012-12-30 (×2): 40 meq via ORAL
  Filled 2012-12-30 (×2): qty 2

## 2012-12-30 NOTE — Progress Notes (Signed)
Patient ID: Preston [name], male   DOB: Feb 13, 1971, 42 y.o.   MRN: [id number]    Subjective: Pt reports pain is better but still present at rest in suprapubic region, denies n/v  Objective: Vital signs in last 24 hours: Temp:  [98.1 F (36.7 C)-98.9 F (37.2 C)] 98.1 F (36.7 C) (06/03 0534) Pulse Rate:  [80-103] 83 (06/03 0534) Resp:  [16-21] 18 (06/03 0534) BP: (147-177)/(95-121) 147/97 mmHg (06/03 0534) SpO2:  [96 %-100 %] 96 % (06/03 0534) Weight:  [257 lb 4.4 oz (116.7 kg)] 257 lb 4.4 oz (116.7 kg) (06/02 2319) Last BM Date: 12/30/12  Intake/Output from previous day: 06/02 0701 - 06/03 0700 In: 1141.7 [I.V.:791.7; IV Piggyback:350] Out: 1331 [Urine:1330; Stool:1] Intake/Output this shift:    PE: Abd: soft, tender in suprapubic region but no peritonitis, +BS General: awake, NAD  Lab Results:   Recent Labs  12/29/12 1810 12/30/12 0410  WBC 13.4* 9.3  HGB 13.9 13.5  HCT 41.7 40.0  PLT 271 256   BMET  Recent Labs  12/29/12 1527 12/30/12 0410  NA 139 138  K 4.4 3.3*  CL 98 102  CO2 27 26  GLUCOSE 91 114*  BUN 6 4*  CREATININE 0.68 0.61  CALCIUM 10.1 9.2   PT/INR No results found for this basename: LABPROT, INR,  in the last 72 hours CMP     Component Value Date/Time   NA 138 12/30/2012 0410   K 3.3* 12/30/2012 0410   CL 102 12/30/2012 0410   CO2 26 12/30/2012 0410   GLUCOSE 114* 12/30/2012 0410   BUN 4* 12/30/2012 0410   CREATININE 0.61 12/30/2012 0410   CALCIUM 9.2 12/30/2012 0410   PROT 6.7 12/30/2012 0410   ALBUMIN 3.6 12/30/2012 0410   AST 16 12/30/2012 0410   ALT 22 12/30/2012 0410   ALKPHOS 58 12/30/2012 0410   BILITOT 0.7 12/30/2012 0410   GFRNONAA >90 12/30/2012 0410   GFRAA >90 12/30/2012 0410   Lipase  No results found for this basename: lipase       Studies/Results: Ct Abdomen Pelvis W Contrast  12/29/2012   *RADIOLOGY REPORT*  Clinical Data: Persistent abdominal pain, recent diverticulitis with abscess  CT ABDOMEN AND PELVIS WITH CONTRAST  Technique:   Multidetector CT imaging of the abdomen and pelvis was performed following the standard protocol during bolus administration of intravenous contrast.  Contrast: OMNIPAQUE IOHEXOL 300 MG/ML  SOLN  Comparison: Most recent prior CT abdomen/pelvis 12/17/2012  Findings:  Lower Chest:  The lung bases are clear.  Visualized cardiac structures are within normal limits for size.  No pericardial effusion.  The distal thoracic esophagus is unremarkable.  Abdomen: Unremarkable CT appearance of the stomach, duodenum, spleen, adrenal glands and pancreas.  Normal hepatic contour without focal lesion. Gallbladder is unremarkable. No intra or extrahepatic biliary ductal dilatation.  Symmetric renal parenchymal enhancement bilaterally.  No hydronephrosis or nephrolithiasis.  No enhancing renal mass.  Normal-caliber large and small bowel throughout the abdomen.  No evidence of obstruction.  Persistent submucosal thickening and inflammation of the pericolonic fat in the sigmoid colon consistent with diverticulitis.  The previously identified pericolonic abscess continues to resolve and is now barely discernible.  Retained oral contrast material is noted within several sigmoid colonic diverticula.  The degree of inflammatory change is very similar compared to 12/17/2012.  The remainder of the visualized colon is unremarkable.  No free fluid or new suspicious adenopathy. Retroperitoneal lymph node at the aortic bifurcation again measures 10 mm  in short axis.  Pelvis: The bladder is distended with urine.  Unremarkable seminal vesicles and prostate gland.  Bones: No acute fracture or aggressive appearing lytic or blastic osseous lesion.  Right hip osteoarthritis.  Vascular: Trace vascular calcifications without aneurysmal dilatation or focal narrowing.  IMPRESSION:  Persistent versus recurrent sigmoid colitis.  The previously identified pericolic abscess continues to resolve and is barely identifiable on the current examination. The  degree of colonic wall thickening and pericolonic inflammation is very similar compared to 12/17/2012.  Although persistent or recurrent diverticulitis is most likely, consideration should be given to inflammatory colitis, and potentially underlying malignancy given the persistence of the findings.   Original Report Authenticated By: Malachy Moan, M.D.    Anti-infectives: Anti-infectives   Start     Dose/Rate Route Frequency Ordered Stop   12/30/12 0200  piperacillin-tazobactam (ZOSYN) IVPB 3.375 g     3.375 g 12.5 mL/hr over 240 Minutes Intravenous Every 8 hours 12/30/12 0037     12/29/12 2230  ciprofloxacin (CIPRO) IVPB 400 mg     400 mg 200 mL/hr over 60 Minutes Intravenous  Once 12/29/12 2223 12/30/12 0150   12/29/12 2230  metroNIDAZOLE (FLAGYL) IVPB 500 mg     500 mg 100 mL/hr over 60 Minutes Intravenous  Once 12/29/12 2223         Assessment/Plan Persistent versus recurrent sigmoid diverticulitis: a little better today but will keep him on bowel rest today, poss diet tomorrow if symptoms continue to improve, on cipro, zosyn and flagyl, will follow    LOS: 1 day    Preston [name] 12/30/2012

## 2012-12-30 NOTE — Progress Notes (Signed)
   Patient seen and examined, and data base reviewed.  Patient seen earlier today by my colleague Dr.  He came in with abdominal pain, CT scan showed sigmoid diverticulitis.  Has history of microabscess/perforation this is improved since last CT scan in April.  Placed on IV Zosyn, likely within it prolonged period of antibiotics maybe 10 or even 14 days.  Clint Lipps Pager: [phone] 12/30/2012, 6:04 PM

## 2012-12-30 NOTE — H&P (Signed)
[facility] History and Physical  Preston [name] ZOX:[id number] DOB: 06/03/1971 DOA: 12/29/2012  Referring physician: ER physician. PCP: No PCP Per Patient  Specialists: [facility].  Chief Complaint: Abdominal pain.  HPI: Preston [name] is a 42 y.o. male who was admitted to the hospital in April for sigmoid diverticulitis with perforation and abscess was discharged at that time on oral antibiotics and did not require any surgery started developing worsening pain last 4 days. Patient also had associated nausea vomiting and fever chills and also diarrhea. Patient states since last discharge his pain in his abdomen has not completely resolved. The pain is mostly in the lower quadrants and suprapubic with increased urination. Patient also has been having diarrhea. In the ER CT abdomen and pelvis at this time shows persistent versus recurrent sigmoid diverticulitis. There was no abscess present. On-call surgeon Dr. Janee Morn has already evaluated the patient and patient will be admitted for further management. Patient otherwise denies any chest pain or shortness of breath. Has been having chronic lower exudate pain for last 6-8 weeks.  Review of Systems: As presented in the history of presenting illness, rest negative.  Past Medical History  Diagnosis Date  . Gout   . Hypertension   . Diverticulitis    Past Surgical History  Procedure Laterality Date  . Appendectomy    . Mouth surgery  2010   Social History:  reports that he has been smoking Cigarettes.  He has a 29 pack-year smoking history. He does not have any smokeless tobacco history on file. He reports that  drinks alcohol. He reports that he does not use illicit drugs. Lives at home. where does patient live-- Can do ADLs. Can patient participate in ADLs?  No Known Allergies  Family History  Problem Relation Age of Onset  . Cancer Father       Prior to Admission medications   Medication Sig Start Date End  Date Taking? Authorizing Provider  albuterol (PROVENTIL HFA;VENTOLIN HFA) 108 (90 BASE) MCG/ACT inhaler Inhale 2 puffs into the lungs every 6 (six) hours as needed for wheezing.   Yes Historical Provider, MD  allopurinol (ZYLOPRIM) 300 MG tablet Take 300 mg by mouth at bedtime. 12/10/12  Yes [name], PA-C  colchicine 0.6 MG tablet Take 0.6 mg by mouth every 3 (three) hours as needed. For gout flare up   Yes Historical Provider, MD  ibuprofen (ADVIL,MOTRIN) 200 MG tablet Take 800 mg by mouth every 6 (six) hours as needed for pain. For pain   Yes Historical Provider, MD  indomethacin (INDOCIN) 25 MG capsule Take 25 mg by mouth 3 (three) times daily as needed. For gout flare up 12/10/12  Yes [name], PA-C   Physical Exam: Filed Vitals:   12/29/12 2100 12/29/12 2115 12/29/12 2252 12/29/12 2319  BP: 170/107  149/107 165/114  Pulse: 85 87 84 80  Temp:   98.3 F (36.8 C) 98.9 F (37.2 C)  TempSrc:   Oral Oral  Resp: 21 19 16 20   Height:    6\' 2"  (1.88 m)  Weight:    116.7 kg (257 lb 4.4 oz)  SpO2: 97% 99% 97% 98%     General:  Well-developed well-nourished.  Eyes: Anicteric no pallor.  ENT: No discharge from the ears eyes nose mouth.  Neck: No mass felt.  Cardiovascular: S1-S2 heard.  Respiratory: No rhonchi or crepitations.  Abdomen: Mild tenderness in the lower quadrants.  Skin: No rash.  Musculoskeletal: No edema.  Psychiatric: Appears  normal.  Neurologic: Alert awake oriented to time place and person. Moves all extremities.  Labs on Admission:  Basic Metabolic Panel:  Recent Labs Lab 12/29/12 1527  NA 139  K 4.4  CL 98  CO2 27  GLUCOSE 91  BUN 6  CREATININE 0.68  CALCIUM 10.1   Liver Function Tests:  Recent Labs Lab 12/29/12 1527  AST 25  ALT 29  ALKPHOS 69  BILITOT 0.6  PROT 7.9  ALBUMIN 4.5   No results found for this basename: LIPASE, AMYLASE,  in the last 168 hours No results found for this basename: AMMONIA,  in the last 168  hours CBC:  Recent Labs Lab 12/29/12 1810  WBC 13.4*  NEUTROABS 10.3*  HGB 13.9  HCT 41.7  MCV 90.7  PLT 271   Cardiac Enzymes: No results found for this basename: CKTOTAL, CKMB, CKMBINDEX, TROPONINI,  in the last 168 hours  BNP (last 3 results) No results found for this basename: PROBNP,  in the last 8760 hours CBG: No results found for this basename: GLUCAP,  in the last 168 hours  Radiological Exams on Admission: Ct Abdomen Pelvis W Contrast  12/29/2012   *RADIOLOGY REPORT*  Clinical Data: Persistent abdominal pain, recent diverticulitis with abscess  CT ABDOMEN AND PELVIS WITH CONTRAST  Technique:  Multidetector CT imaging of the abdomen and pelvis was performed following the standard protocol during bolus administration of intravenous contrast.  Contrast: OMNIPAQUE IOHEXOL 300 MG/ML  SOLN  Comparison: Most recent prior CT abdomen/pelvis 12/17/2012  Findings:  Lower Chest:  The lung bases are clear.  Visualized cardiac structures are within normal limits for size.  No pericardial effusion.  The distal thoracic esophagus is unremarkable.  Abdomen: Unremarkable CT appearance of the stomach, duodenum, spleen, adrenal glands and pancreas.  Normal hepatic contour without focal lesion. Gallbladder is unremarkable. No intra or extrahepatic biliary ductal dilatation.  Symmetric renal parenchymal enhancement bilaterally.  No hydronephrosis or nephrolithiasis.  No enhancing renal mass.  Normal-caliber large and small bowel throughout the abdomen.  No evidence of obstruction.  Persistent submucosal thickening and inflammation of the pericolonic fat in the sigmoid colon consistent with diverticulitis.  The previously identified pericolonic abscess continues to resolve and is now barely discernible.  Retained oral contrast material is noted within several sigmoid colonic diverticula.  The degree of inflammatory change is very similar compared to 12/17/2012.  The remainder of the visualized colon  is unremarkable.  No free fluid or new suspicious adenopathy. Retroperitoneal lymph node at the aortic bifurcation again measures 10 mm in short axis.  Pelvis: The bladder is distended with urine.  Unremarkable seminal vesicles and prostate gland.  Bones: No acute fracture or aggressive appearing lytic or blastic osseous lesion.  Right hip osteoarthritis.  Vascular: Trace vascular calcifications without aneurysmal dilatation or focal narrowing.  IMPRESSION:  Persistent versus recurrent sigmoid colitis.  The previously identified pericolic abscess continues to resolve and is barely identifiable on the current examination. The degree of colonic wall thickening and pericolonic inflammation is very similar compared to 12/17/2012.  Although persistent or recurrent diverticulitis is most likely, consideration should be given to inflammatory colitis, and potentially underlying malignancy given the persistence of the findings.   Original Report Authenticated By: Malachy Moan, M.D.     Assessment/Plan Principal Problem:   Diverticulitis Active Problems:   Gout   HTN (hypertension)   Tobacco abuse   1. Recurrent sigmoid diverticulitis - patient has been kept n.p.o. except medications. Patient has been placed  on IV Zosyn. Surgery consult appreciated. 2. Hypertension - patient states his antihypertensives were discontinued last month. At this time I will be keeping patient on when necessary IV hydralazine for systolic blood pressure more than 160. Closely follow blood pressure trends. 3. Gout - continue allopurinol and when necessary colchicine. We will hold off NSAIDs for now. 4. Tobacco and alcohol abuse - patient advised to quit smoking. Patient is placed on alcohol withdrawal protocol.    Code Status: Full code.  Family Communication: Patient's wife at the bedside.  Disposition Plan: Admit to inpatient.    Gaspare Netzel N. [facility] Pager [phone].  If 7PM-7AM, please contact  night-coverage [email or web address] Password [facility] 12/30/2012, 12:25 AM

## 2012-12-30 NOTE — Progress Notes (Signed)
Ideally needs colonoscopy and delay any surgical intervention to later time. Pt has seen Dr Derrell Lolling at [facility]. For this problem. Cont bowel rest for 24 hours and reassess.

## 2012-12-30 NOTE — Care Management Note (Unsigned)
    Page 1 of 1   01/01/2013     12:37:02 PM   CARE MANAGEMENT NOTE 01/01/2013  Patient:  Preston [name], Preston [name]   Account Number:  [id number]  Date Initiated:  12/30/2012  Documentation initiated by:  Letha Cape  Subjective/Objective Assessment:   dx diverticulitis  admit- lives with spouse.     Action/Plan:   Anticipated DC Date:  01/01/2013   Anticipated DC Plan:  HOME/SELF CARE      DC Planning Services  CM consult      Choice offered to / List presented to:             Status of service:  In process, will continue to follow Medicare Important Message given?   (If response is "NO", the following Medicare IM given date fields will be blank) Date Medicare IM given:   Date Additional Medicare IM given:    Discharge Disposition:    Per UR Regulation:    If discussed at Long Length of Stay Meetings, dates discussed:    Comments:  01/01/13 12:35 Letha Cape RN,BSN [phone] patient is for possible dc today if tolerates his diet. Patient has medication coverage and transportation.  12/30/12 10:33 Letha Cape RN, BSN [phone] patient lives with spouse, NCM will continue to follow for dc needs.

## 2012-12-30 NOTE — Progress Notes (Addendum)
*  PRELIMINARY RESULTS* Vascular Ultrasound Lower extremity venous duplex has been completed.  Preliminary findings: Negative for DVT and baker's cyst.  Bilateral PT and AT arteries were duplex imaged revealing triphasic waveforms with no plaque morphology. This demonstrates adequate perfusion. ABI study would not give any significantly different results.  Please advise if ABI study is still needed.    Farrel Demark, RDMS, RVT  12/30/2012, 2:13 PM

## 2012-12-31 LAB — BASIC METABOLIC PANEL
BUN: 4 mg/dL — ABNORMAL LOW (ref 6–23)
CO2: 25 mEq/L (ref 19–32)
Calcium: 9.6 mg/dL (ref 8.4–10.5)
Creatinine, Ser: 0.64 mg/dL (ref 0.50–1.35)
Glucose, Bld: 110 mg/dL — ABNORMAL HIGH (ref 70–99)

## 2012-12-31 LAB — GLUCOSE, CAPILLARY
Glucose-Capillary: 110 mg/dL — ABNORMAL HIGH (ref 70–99)
Glucose-Capillary: 142 mg/dL — ABNORMAL HIGH (ref 70–99)

## 2012-12-31 LAB — CBC
MCH: 30.2 pg (ref 26.0–34.0)
MCV: 90.9 fL (ref 78.0–100.0)
Platelets: 263 10*3/uL (ref 150–400)
RDW: 13.5 % (ref 11.5–15.5)

## 2012-12-31 MED ORDER — INDOMETHACIN 50 MG PO CAPS
50.0000 mg | ORAL_CAPSULE | Freq: Three times a day (TID) | ORAL | Status: DC
  Administered 2012-12-31 – 2013-01-01 (×5): 50 mg via ORAL
  Filled 2012-12-31 (×7): qty 1

## 2012-12-31 MED ORDER — ENOXAPARIN SODIUM 40 MG/0.4ML ~~LOC~~ SOLN
40.0000 mg | SUBCUTANEOUS | Status: DC
  Administered 2012-12-31: 40 mg via SUBCUTANEOUS
  Filled 2012-12-31 (×2): qty 0.4

## 2012-12-31 NOTE — Progress Notes (Signed)
PATIENT DETAILS Name: Preston [name] Age: 42 y.o. Sex: male Date of Birth: May 23, 1971 Admit Date: 12/29/2012 Admitting Physician Eduard Clos, MD PCP:No PCP Per Patient  Subjective: Abdominal pain better, however thinks he is having a acute gout flare in the right knee and ankle area  Assessment/Plan: Principal Problem:   Diverticulitis -advance to Full liquids -c/w Zosyn -appreciate CCS input  Active Problems:   Acute Gouty Arthritis -trial of Indomethacin    HTN (hypertension) -not on any anti-hypertensives-BP slightly elevated-monitor off meds for now  Tobacco and alcohol abuse  - patient advised to quit smoking.  -on CIWA-no signs of withdrawal  Disposition: Remain inpatient  DVT Prophylaxis: Prophylactic Lovenox  Code Status: Full code   Family Communication None  Procedures:  None  CONSULTS:  general surgery   MEDICATIONS: Scheduled Meds: . allopurinol  300 mg Oral QHS  . folic acid  1 mg Oral Daily  . indomethacin  50 mg Oral TID WC  . LORazepam  0-4 mg Intravenous Q6H   Followed by  . [START ON 01/01/2013] LORazepam  0-4 mg Intravenous Q12H  . metronidazole  500 mg Intravenous Once  . multivitamin with minerals  1 tablet Oral Daily  . pantoprazole  40 mg Oral Daily  . piperacillin-tazobactam (ZOSYN)  IV  3.375 g Intravenous Q8H  . thiamine  100 mg Oral Daily   Or  . thiamine  100 mg Intravenous Daily   Continuous Infusions:  PRN Meds:.acetaminophen, acetaminophen, albuterol, colchicine, hydrALAZINE, HYDROmorphone (DILAUDID) injection, LORazepam, LORazepam, ondansetron (ZOFRAN) IV, ondansetron  Antibiotics: Anti-infectives   Start     Dose/Rate Route Frequency Ordered Stop   12/30/12 0200  piperacillin-tazobactam (ZOSYN) IVPB 3.375 g     3.375 g 12.5 mL/hr over 240 Minutes Intravenous Every 8 hours 12/30/12 0037     12/29/12 2230  ciprofloxacin (CIPRO) IVPB 400 mg     400 mg 200 mL/hr over 60 Minutes Intravenous  Once 12/29/12  2223 12/30/12 0150   12/29/12 2230  metroNIDAZOLE (FLAGYL) IVPB 500 mg     500 mg 100 mL/hr over 60 Minutes Intravenous  Once 12/29/12 2223         PHYSICAL EXAM: Vital signs in last 24 hours: Filed Vitals:   12/30/12 0534 12/30/12 1300 12/30/12 2017 12/31/12 0542  BP: 147/97 145/85 152/101 154/96  Pulse: 83 82 79 85  Temp: 98.1 F (36.7 C) 98 F (36.7 C) 98.3 F (36.8 C) 98.2 F (36.8 C)  TempSrc: Oral Oral Oral Oral  Resp: 18 18 16 16   Height:      Weight:      SpO2: 96% 98% 97% 96%    Weight change:  Filed Weights   12/29/12 2319  Weight: 116.7 kg (257 lb 4.4 oz)   Body mass index is 33.02 kg/(m^2).   Gen Exam: Awake and alert with clear speech.   Neck: Supple, No JVD.   Chest: B/L Clear.   CVS: S1 S2 Regular, no murmurs.  Abdomen: soft, BS +, non tender, non distended.  Extremities: no edema, lower extremities warm to touch. Neurologic: Non Focal.   Skin: No Rash.   Wounds: N/A.   Intake/Output from previous day:  Intake/Output Summary (Last 24 hours) at 12/31/12 1200 Last data filed at 12/31/12 0541  Gross per 24 hour  Intake 1757.92 ml  Output   3450 ml  Net -1692.08 ml     LAB RESULTS: CBC  Recent Labs Lab 12/29/12 1810 12/30/12 0410 12/31/12 0525  WBC 13.4*  9.3 11.4*  HGB 13.9 13.5 13.6  HCT 41.7 40.0 40.9  PLT 271 256 263  MCV 90.7 90.3 90.9  MCH 30.2 30.5 30.2  MCHC 33.3 33.8 33.3  RDW 13.4 13.4 13.5  LYMPHSABS 2.3 2.4  --   MONOABS 0.6 0.5  --   EOSABS 0.2 0.2  --   BASOSABS 0.0 0.0  --     Chemistries   Recent Labs Lab 12/29/12 1527 12/30/12 0410 12/31/12 0525  NA 139 138 139  K 4.4 3.3* 4.0  CL 98 102 105  CO2 27 26 25   GLUCOSE 91 114* 110*  BUN 6 4* 4*  CREATININE 0.68 0.61 0.64  CALCIUM 10.1 9.2 9.6    CBG:  Recent Labs Lab 12/30/12 0531 12/30/12 1157 12/30/12 1729 12/31/12 0040 12/31/12 0542  GLUCAP 107* 104* 138* 100* 108*    GFR Estimated Creatinine Clearance: 165 ml/min (by C-G formula based  on Cr of 0.64).  Coagulation profile No results found for this basename: INR, PROTIME,  in the last 168 hours  Cardiac Enzymes No results found for this basename: CK, CKMB, TROPONINI, MYOGLOBIN,  in the last 168 hours  No components found with this basename: POCBNP,  No results found for this basename: DDIMER,  in the last 72 hours No results found for this basename: HGBA1C,  in the last 72 hours No results found for this basename: CHOL, HDL, LDLCALC, TRIG, CHOLHDL, LDLDIRECT,  in the last 72 hours No results found for this basename: TSH, T4TOTAL, FREET3, T3FREE, THYROIDAB,  in the last 72 hours No results found for this basename: VITAMINB12, FOLATE, FERRITIN, TIBC, IRON, RETICCTPCT,  in the last 72 hours No results found for this basename: LIPASE, AMYLASE,  in the last 72 hours  Urine Studies No results found for this basename: UACOL, UAPR, USPG, UPH, UTP, UGL, UKET, UBIL, UHGB, UNIT, UROB, ULEU, UEPI, UWBC, URBC, UBAC, CAST, CRYS, UCOM, BILUA,  in the last 72 hours  MICROBIOLOGY: Recent Results (from the past 240 hour(s))  CLOSTRIDIUM DIFFICILE BY PCR     Status: None   Collection Time    12/30/12 12:45 AM      Result Value Range Status   C difficile by pcr NEGATIVE  NEGATIVE Final    RADIOLOGY STUDIES/RESULTS: Ct Abdomen Pelvis W Contrast  12/29/2012   *RADIOLOGY REPORT*  Clinical Data: Persistent abdominal pain, recent diverticulitis with abscess  CT ABDOMEN AND PELVIS WITH CONTRAST  Technique:  Multidetector CT imaging of the abdomen and pelvis was performed following the standard protocol during bolus administration of intravenous contrast.  Contrast: OMNIPAQUE IOHEXOL 300 MG/ML  SOLN  Comparison: Most recent prior CT abdomen/pelvis 12/17/2012  Findings:  Lower Chest:  The lung bases are clear.  Visualized cardiac structures are within normal limits for size.  No pericardial effusion.  The distal thoracic esophagus is unremarkable.  Abdomen: Unremarkable CT appearance of the  stomach, duodenum, spleen, adrenal glands and pancreas.  Normal hepatic contour without focal lesion. Gallbladder is unremarkable. No intra or extrahepatic biliary ductal dilatation.  Symmetric renal parenchymal enhancement bilaterally.  No hydronephrosis or nephrolithiasis.  No enhancing renal mass.  Normal-caliber large and small bowel throughout the abdomen.  No evidence of obstruction.  Persistent submucosal thickening and inflammation of the pericolonic fat in the sigmoid colon consistent with diverticulitis.  The previously identified pericolonic abscess continues to resolve and is now barely discernible.  Retained oral contrast material is noted within several sigmoid colonic diverticula.  The degree of inflammatory  change is very similar compared to 12/17/2012.  The remainder of the visualized colon is unremarkable.  No free fluid or new suspicious adenopathy. Retroperitoneal lymph node at the aortic bifurcation again measures 10 mm in short axis.  Pelvis: The bladder is distended with urine.  Unremarkable seminal vesicles and prostate gland.  Bones: No acute fracture or aggressive appearing lytic or blastic osseous lesion.  Right hip osteoarthritis.  Vascular: Trace vascular calcifications without aneurysmal dilatation or focal narrowing.  IMPRESSION:  Persistent versus recurrent sigmoid colitis.  The previously identified pericolic abscess continues to resolve and is barely identifiable on the current examination. The degree of colonic wall thickening and pericolonic inflammation is very similar compared to 12/17/2012.  Although persistent or recurrent diverticulitis is most likely, consideration should be given to inflammatory colitis, and potentially underlying malignancy given the persistence of the findings.   Original Report Authenticated By: Malachy Moan, M.D.   Ct Abdomen Pelvis W Contrast  12/17/2012   *RADIOLOGY REPORT*  Clinical Data: History diverticulitis and abscess, follow-up  CT  ABDOMEN AND PELVIS WITH CONTRAST  Technique:  Multidetector CT imaging of the abdomen and pelvis was performed following the standard protocol during bolus administration of intravenous contrast.  Contrast: OMNIPAQUE IOHEXOL 300 MG/ML  SOLN  Comparison: CT abdomen and pelvis of 11/14/2012  Findings: The lung bases are clear.  The liver enhances with no focal abnormality and no ductal dilatation is seen.  No calcified gallstones are noted within the contracted gallbladder.  The pancreas is normal in size and the pancreatic duct is not dilated. The adrenal glands and spleen are unremarkable.  The stomach is moderately fluid distended with no abnormality noted.  The kidneys enhance with no calculus or mass and no hydronephrosis is seen.  On delayed images, the pelvocaliceal systems are unremarkable.  The proximal ureters are normal in caliber.  The abdominal aorta appears normal in caliber.  Only small retroperitoneal nodes are present which appear stable.  There is still mucosal thickening of the rectosigmoid colon at the site of prior diverticulitis.  However the pericolonic fat planes are better seen with less adjacent inflammatory reaction. The previously described abscess in the mid pelvis has diminished in size now measuring 1.9 x 2.8 cm compared to prior measurements of 3.7 x 2.7 cm.  No free fluid or free air is seen within the pelvis. The urinary bladder is not optimally distended but no significant abnormality is seen other than probable mild edema of the urinary bladder wall on the left adjacent to the focus of diverticulitis in the left anterior pelvis.  The prostate gland is normal in size. The terminal ileum is unremarkable.  Degenerative joint disease is noted in the hips right greater than left. The lumbar vertebrae are in normal alignment.  IMPRESSION:  1.  Significant improvement in the previously described diverticulitis of the rectosigmoid colon.  Residual mucosal edema is present but the  adjacent inflammatory process has significantly improved. 2.  Decrease in size of the pericolonic abscess as noted above. 3.  Degenerative joint disease of the hips, right greater than left.   Original Report Authenticated By: Dwyane Dee, M.D.    Jeoffrey Massed, MD  [facility] Pager:[phone]  If 7PM-7AM, please contact night-coverage [email or web address] Password [email or web address] 12/31/2012, 12:00 PM   LOS: 2 days

## 2012-12-31 NOTE — Progress Notes (Signed)
Seen and agree.   Better.  Feed and hopefully discharge next 1- 2 days.  Outpatient follow up with Dr Derrell Lolling

## 2012-12-31 NOTE — Progress Notes (Signed)
Patient ID: Preston [name], male   DOB: 1971-05-24, 42 y.o.   MRN: [id number] Subjective: Continues to improve, min pain suprapubic region, denies n/v, hungry  Objective: Vital signs in last 24 hours: Temp:  [98 F (36.7 C)-98.3 F (36.8 C)] 98.2 F (36.8 C) (06/04 0542) Pulse Rate:  [79-85] 85 (06/04 0542) Resp:  [16-18] 16 (06/04 0542) BP: (145-154)/(85-101) 154/96 mmHg (06/04 0542) SpO2:  [96 %-98 %] 96 % (06/04 0542) Last BM Date: 12/30/12  Intake/Output from previous day: 06/03 0701 - 06/04 0700 In: 1807.9 [P.O.:660; I.V.:1097.9; IV Piggyback:50] Out: 4175 [Urine:4175] Intake/Output this shift:    PE: Abd: soft, min tender with palp, +BS General: awake, NAD  Lab Results:   Recent Labs  12/30/12 0410 12/31/12 0525  WBC 9.3 11.4*  HGB 13.5 13.6  HCT 40.0 40.9  PLT 256 263   BMET  Recent Labs  12/30/12 0410 12/31/12 0525  NA 138 139  K 3.3* 4.0  CL 102 105  CO2 26 25  GLUCOSE 114* 110*  BUN 4* 4*  CREATININE 0.61 0.64  CALCIUM 9.2 9.6   PT/INR No results found for this basename: LABPROT, INR,  in the last 72 hours CMP     Component Value Date/Time   NA 139 12/31/2012 0525   K 4.0 12/31/2012 0525   CL 105 12/31/2012 0525   CO2 25 12/31/2012 0525   GLUCOSE 110* 12/31/2012 0525   BUN 4* 12/31/2012 0525   CREATININE 0.64 12/31/2012 0525   CALCIUM 9.6 12/31/2012 0525   PROT 6.7 12/30/2012 0410   ALBUMIN 3.6 12/30/2012 0410   AST 16 12/30/2012 0410   ALT 22 12/30/2012 0410   ALKPHOS 58 12/30/2012 0410   BILITOT 0.7 12/30/2012 0410   GFRNONAA >90 12/31/2012 0525   GFRAA >90 12/31/2012 0525   Lipase  No results found for this basename: lipase       Studies/Results: Ct Abdomen Pelvis W Contrast  12/29/2012   *RADIOLOGY REPORT*  Clinical Data: Persistent abdominal pain, recent diverticulitis with abscess  CT ABDOMEN AND PELVIS WITH CONTRAST  Technique:  Multidetector CT imaging of the abdomen and pelvis was performed following the standard protocol during bolus  administration of intravenous contrast.  Contrast: OMNIPAQUE IOHEXOL 300 MG/ML  SOLN  Comparison: Most recent prior CT abdomen/pelvis 12/17/2012  Findings:  Lower Chest:  The lung bases are clear.  Visualized cardiac structures are within normal limits for size.  No pericardial effusion.  The distal thoracic esophagus is unremarkable.  Abdomen: Unremarkable CT appearance of the stomach, duodenum, spleen, adrenal glands and pancreas.  Normal hepatic contour without focal lesion. Gallbladder is unremarkable. No intra or extrahepatic biliary ductal dilatation.  Symmetric renal parenchymal enhancement bilaterally.  No hydronephrosis or nephrolithiasis.  No enhancing renal mass.  Normal-caliber large and small bowel throughout the abdomen.  No evidence of obstruction.  Persistent submucosal thickening and inflammation of the pericolonic fat in the sigmoid colon consistent with diverticulitis.  The previously identified pericolonic abscess continues to resolve and is now barely discernible.  Retained oral contrast material is noted within several sigmoid colonic diverticula.  The degree of inflammatory change is very similar compared to 12/17/2012.  The remainder of the visualized colon is unremarkable.  No free fluid or new suspicious adenopathy. Retroperitoneal lymph node at the aortic bifurcation again measures 10 mm in short axis.  Pelvis: The bladder is distended with urine.  Unremarkable seminal vesicles and prostate gland.  Bones: No acute fracture or aggressive appearing lytic  or blastic osseous lesion.  Right hip osteoarthritis.  Vascular: Trace vascular calcifications without aneurysmal dilatation or focal narrowing.  IMPRESSION:  Persistent versus recurrent sigmoid colitis.  The previously identified pericolic abscess continues to resolve and is barely identifiable on the current examination. The degree of colonic wall thickening and pericolonic inflammation is very similar compared to 12/17/2012.   Although persistent or recurrent diverticulitis is most likely, consideration should be given to inflammatory colitis, and potentially underlying malignancy given the persistence of the findings.   Original Report Authenticated By: Malachy Moan, M.D.    Anti-infectives: Anti-infectives   Start     Dose/Rate Route Frequency Ordered Stop   12/30/12 0200  piperacillin-tazobactam (ZOSYN) IVPB 3.375 g     3.375 g 12.5 mL/hr over 240 Minutes Intravenous Every 8 hours 12/30/12 0037     12/29/12 2230  ciprofloxacin (CIPRO) IVPB 400 mg     400 mg 200 mL/hr over 60 Minutes Intravenous  Once 12/29/12 2223 12/30/12 0150   12/29/12 2230  metroNIDAZOLE (FLAGYL) IVPB 500 mg     500 mg 100 mL/hr over 60 Minutes Intravenous  Once 12/29/12 2223         Assessment/Plan Persistent versus recurrent sigmoid diverticulitis: continues to improve, will advance diet to fulls, OOB, on cipro, zosyn and flagyl, maybe soft diet for dinner, PO abx poss tomorrow?, will need colonoscopy as outpatient,     LOS: 2 days    WHITE, ELIZABETH 12/31/2012

## 2013-01-01 MED ORDER — OXYCODONE-ACETAMINOPHEN 5-325 MG PO TABS: 1.0000 | ORAL_TABLET | Freq: Four times a day (QID) | ORAL | Status: DC | PRN

## 2013-01-01 MED ORDER — AMOXICILLIN-POT CLAVULANATE 875-125 MG PO TABS: 1.0000 | ORAL_TABLET | Freq: Two times a day (BID) | ORAL | Status: DC

## 2013-01-01 MED ORDER — INDOMETHACIN 25 MG PO CAPS: 25.0000 mg | ORAL_CAPSULE | Freq: Three times a day (TID) | ORAL | Status: DC | PRN

## 2013-01-01 MED ORDER — ALLOPURINOL 300 MG PO TABS: 300.0000 mg | ORAL_TABLET | Freq: Every day | ORAL | Status: DC

## 2013-01-01 NOTE — Progress Notes (Signed)
  Subjective: Feels good. Eating no pain  Objective: Vital signs in last 24 hours: Temp:  [97.6 F (36.4 C)-98.7 F (37.1 C)] 98.2 F (36.8 C) (06/05 0505) Pulse Rate:  [78-86] 78 (06/05 0505) Resp:  [18] 18 (06/05 0505) BP: (138-147)/(76-103) 144/96 mmHg (06/05 0520) SpO2:  [97 %-98 %] 97 % (06/05 0505) Last BM Date: 12/31/12  Intake/Output from previous day: 06/04 0701 - 06/05 0700 In: 62.5 [IV Piggyback:62.5] Out: 3060 [Urine:3060] Intake/Output this shift: Total I/O In: -  Out: 200 [Urine:200]  GI: soft, non-tender; bowel sounds normal; no masses,  no organomegaly  Lab Results:   Recent Labs  12/30/12 0410 12/31/12 0525  WBC 9.3 11.4*  HGB 13.5 13.6  HCT 40.0 40.9  PLT 256 263   BMET  Recent Labs  12/30/12 0410 12/31/12 0525  NA 138 139  K 3.3* 4.0  CL 102 105  CO2 26 25  GLUCOSE 114* 110*  BUN 4* 4*  CREATININE 0.61 0.64  CALCIUM 9.2 9.6   PT/INR No results found for this basename: LABPROT, INR,  in the last 72 hours ABG No results found for this basename: PHART, PCO2, PO2, HCO3,  in the last 72 hours  Studies/Results: No results found.  Anti-infectives: Anti-infectives   Start     Dose/Rate Route Frequency Ordered Stop   12/30/12 0200  piperacillin-tazobactam (ZOSYN) IVPB 3.375 g     3.375 g 12.5 mL/hr over 240 Minutes Intravenous Every 8 hours 12/30/12 0037     12/29/12 2230  ciprofloxacin (CIPRO) IVPB 400 mg     400 mg 200 mL/hr over 60 Minutes Intravenous  Once 12/29/12 2223 12/30/12 0150   12/29/12 2230  metroNIDAZOLE (FLAGYL) IVPB 500 mg     500 mg 100 mL/hr over 60 Minutes Intravenous  Once 12/29/12 2223        Assessment/Plan: Diverticulitis  Improved  Can call office and follow up with Dr Derrell Lolling.  Pt needs to  See PCP and needs colonoscopy as outpatient. Will sign off  LOS: 3 days    Preston [name] A. 01/01/2013

## 2013-01-01 NOTE — Discharge Summary (Addendum)
PATIENT DETAILS Name: Preston [name] Age: 42 y.o. Sex: male Date of Birth: 04/19/1971 MRN: [id number]. Admit Date: 12/29/2012 Admitting Physician: Preston Clos, MD PCP:No PCP Per Patient  Recommendations for Outpatient Follow-up:  1. Needs referral to GI for a colonoscopy one Diverticulitis subsides 2. Needs outpatient CCS eval  3. Re-evaluate whether patient needs to be restarted on Anti hypertensive medications  PRIMARY DISCHARGE DIAGNOSIS:  Principal Problem:   Diverticulitis Active Problems:   Gout   HTN (hypertension)   Tobacco abuse      PAST MEDICAL HISTORY: Past Medical History  Diagnosis Date  . Gout   . Hypertension   . Diverticulitis     DISCHARGE MEDICATIONS:   Medication List    STOP taking these medications       colchicine 0.6 MG tablet     ibuprofen 200 MG tablet  Commonly known as:  ADVIL,MOTRIN      TAKE these medications       albuterol 108 (90 BASE) MCG/ACT inhaler  Commonly known as:  PROVENTIL HFA;VENTOLIN HFA  Inhale 2 puffs into the lungs every 6 (six) hours as needed for wheezing.     allopurinol 300 MG tablet  Commonly known as:  ZYLOPRIM  Take 1 tablet (300 mg total) by mouth at bedtime.     amoxicillin-clavulanate 875-125 MG per tablet  Commonly known as:  AUGMENTIN  Take 1 tablet by mouth 2 (two) times daily.     indomethacin 25 MG capsule  Commonly known as:  INDOCIN  Take 1 capsule (25 mg total) by mouth 3 (three) times daily as needed. For gout flare up     oxyCODONE-acetaminophen 5-325 MG per tablet  Commonly known as:  ROXICET  Take 1 tablet by mouth every 6 (six) hours as needed for pain.        ALLERGIES:   Allergies  Allergen Reactions  . Morphine And Related Other (See Comments)    "bad feeling"    BRIEF HPI:  See H&P, Labs, Consult and Test reports for all details in brief, Preston [name] is a 42 y.o. male who was admitted to the hospital in April for sigmoid diverticulitis with perforation and  abscess was discharged at that time on oral antibiotics and did not require any surgery started developing worsening pain 4 days prior to admission.In the ER CT abdomen and pelvis at this time shows persistent versus recurrent sigmoid diverticulitis  CONSULTATIONS:   general surgery  PERTINENT RADIOLOGIC STUDIES: Ct Abdomen Pelvis W Contrast  12/29/2012   *RADIOLOGY REPORT*  Clinical Data: Persistent abdominal pain, recent diverticulitis with abscess  CT ABDOMEN AND PELVIS WITH CONTRAST  Technique:  Multidetector CT imaging of the abdomen and pelvis was performed following the standard protocol during bolus administration of intravenous contrast.  Contrast: OMNIPAQUE IOHEXOL 300 MG/ML  SOLN  Comparison: Most recent prior CT abdomen/pelvis 12/17/2012  Findings:  Lower Chest:  The lung bases are clear.  Visualized cardiac structures are within normal limits for size.  No pericardial effusion.  The distal thoracic esophagus is unremarkable.  Abdomen: Unremarkable CT appearance of the stomach, duodenum, spleen, adrenal glands and pancreas.  Normal hepatic contour without focal lesion. Gallbladder is unremarkable. No intra or extrahepatic biliary ductal dilatation.  Symmetric renal parenchymal enhancement bilaterally.  No hydronephrosis or nephrolithiasis.  No enhancing renal mass.  Normal-caliber large and small bowel throughout the abdomen.  No evidence of obstruction.  Persistent submucosal thickening and inflammation of the pericolonic fat in the sigmoid  colon consistent with diverticulitis.  The previously identified pericolonic abscess continues to resolve and is now barely discernible.  Retained oral contrast material is noted within several sigmoid colonic diverticula.  The degree of inflammatory change is very similar compared to 12/17/2012.  The remainder of the visualized colon is unremarkable.  No free fluid or new suspicious adenopathy. Retroperitoneal lymph node at the aortic bifurcation again  measures 10 mm in short axis.  Pelvis: The bladder is distended with urine.  Unremarkable seminal vesicles and prostate gland.  Bones: No acute fracture or aggressive appearing lytic or blastic osseous lesion.  Right hip osteoarthritis.  Vascular: Trace vascular calcifications without aneurysmal dilatation or focal narrowing.  IMPRESSION:  Persistent versus recurrent sigmoid colitis.  The previously identified pericolic abscess continues to resolve and is barely identifiable on the current examination. The degree of colonic wall thickening and pericolonic inflammation is very similar compared to 12/17/2012.  Although persistent or recurrent diverticulitis is most likely, consideration should be given to inflammatory colitis, and potentially underlying malignancy given the persistence of the findings.   Original Report Authenticated By: Malachy Moan, M.D.   Ct Abdomen Pelvis W Contrast  12/17/2012   *RADIOLOGY REPORT*  Clinical Data: History diverticulitis and abscess, follow-up  CT ABDOMEN AND PELVIS WITH CONTRAST  Technique:  Multidetector CT imaging of the abdomen and pelvis was performed following the standard protocol during bolus administration of intravenous contrast.  Contrast: OMNIPAQUE IOHEXOL 300 MG/ML  SOLN  Comparison: CT abdomen and pelvis of 11/14/2012  Findings: The lung bases are clear.  The liver enhances with no focal abnormality and no ductal dilatation is seen.  No calcified gallstones are noted within the contracted gallbladder.  The pancreas is normal in size and the pancreatic duct is not dilated. The adrenal glands and spleen are unremarkable.  The stomach is moderately fluid distended with no abnormality noted.  The kidneys enhance with no calculus or mass and no hydronephrosis is seen.  On delayed images, the pelvocaliceal systems are unremarkable.  The proximal ureters are normal in caliber.  The abdominal aorta appears normal in caliber.  Only small retroperitoneal nodes are  present which appear stable.  There is still mucosal thickening of the rectosigmoid colon at the site of prior diverticulitis.  However the pericolonic fat planes are better seen with less adjacent inflammatory reaction. The previously described abscess in the mid pelvis has diminished in size now measuring 1.9 x 2.8 cm compared to prior measurements of 3.7 x 2.7 cm.  No free fluid or free air is seen within the pelvis. The urinary bladder is not optimally distended but no significant abnormality is seen other than probable mild edema of the urinary bladder wall on the left adjacent to the focus of diverticulitis in the left anterior pelvis.  The prostate gland is normal in size. The terminal ileum is unremarkable.  Degenerative joint disease is noted in the hips right greater than left. The lumbar vertebrae are in normal alignment.  IMPRESSION:  1.  Significant improvement in the previously described diverticulitis of the rectosigmoid colon.  Residual mucosal edema is present but the adjacent inflammatory process has significantly improved. 2.  Decrease in size of the pericolonic abscess as noted above. 3.  Degenerative joint disease of the hips, right greater than left.   Original Report Authenticated By: Dwyane Dee, M.D.     PERTINENT LAB RESULTS: CBC:  Recent Labs  12/30/12 0410 12/31/12 0525  WBC 9.3 11.4*  HGB 13.5 13.6  HCT 40.0 40.9  PLT 256 263   CMET CMP     Component Value Date/Time   NA 139 12/31/2012 0525   K 4.0 12/31/2012 0525   CL 105 12/31/2012 0525   CO2 25 12/31/2012 0525   GLUCOSE 110* 12/31/2012 0525   BUN 4* 12/31/2012 0525   CREATININE 0.64 12/31/2012 0525   CALCIUM 9.6 12/31/2012 0525   PROT 6.7 12/30/2012 0410   ALBUMIN 3.6 12/30/2012 0410   AST 16 12/30/2012 0410   ALT 22 12/30/2012 0410   ALKPHOS 58 12/30/2012 0410   BILITOT 0.7 12/30/2012 0410   GFRNONAA >90 12/31/2012 0525   GFRAA >90 12/31/2012 0525    GFR Estimated Creatinine Clearance: 165 ml/min (by C-G formula based on Cr of  0.64). No results found for this basename: LIPASE, AMYLASE,  in the last 72 hours No results found for this basename: CKTOTAL, CKMB, CKMBINDEX, TROPONINI,  in the last 72 hours No components found with this basename: POCBNP,  No results found for this basename: DDIMER,  in the last 72 hours No results found for this basename: HGBA1C,  in the last 72 hours No results found for this basename: CHOL, HDL, LDLCALC, TRIG, CHOLHDL, LDLDIRECT,  in the last 72 hours No results found for this basename: TSH, T4TOTAL, FREET3, T3FREE, THYROIDAB,  in the last 72 hours No results found for this basename: VITAMINB12, FOLATE, FERRITIN, TIBC, IRON, RETICCTPCT,  in the last 72 hours Coags: No results found for this basename: PT, INR,  in the last 72 hours Microbiology: Recent Results (from the past 240 hour(s))  CLOSTRIDIUM DIFFICILE BY PCR     Status: None   Collection Time    12/30/12 12:45 AM      Result Value Range Status   C difficile by pcr NEGATIVE  NEGATIVE Final     BRIEF HOSPITAL COURSE:  Persistent versus recurrent sigmoid diverticulitis with near complete resolution of recent abscess -patient was admitted, kept NPO and started on Zosyn and Flagyl. CCS was consulted, with supportive care he made quick clinical improvement, his diet was slowly advanced, patient has now tolerated a dys 3 diet with no issues. His pain has almost resolved, abdominal exam is benign, with no tenderness. He will be discharged today on Augmentin for 11 more days, to complete 14 days total of treatment.  -Once his diverticulitis resolves, he needs a GI follow up for Colonoscopy and CCS eval for colectomy.This is being deferred to the outpatient setting  Acute Gouty Arthritis -this was involving the right knee, he was given a trial of Indocin with good results. This has completely resolved at the time of discharge  HTN (hypertension) -BP stable without any anti-hypertensive meds, please re-evaluate this at time of PCP  visit  Tobacco and alcohol abuse  - patient advised to quit smoking.  -was on CIWA-no signs of withdrawal at time of discharge or during hospitalization   TODAY-DAY OF DISCHARGE:  Subjective:   [employer] today has no headache,no chest abdominal pain,no new weakness tingling or numbness, feels much better wants to go home today.   Objective:   Blood pressure 144/96, pulse 78, temperature 98.2 F (36.8 C), temperature source Oral, resp. rate 18, height 6\' 2"  (1.88 m), weight 116.7 kg (257 lb 4.4 oz), SpO2 97.00%.  Intake/Output Summary (Last 24 hours) at 01/01/13 1128 Last data filed at 01/01/13 1055  Gross per 24 hour  Intake   37.5 ml  Output   3660 ml  Net -3622.5 ml  Filed Weights   12/29/12 2319  Weight: 116.7 kg (257 lb 4.4 oz)    Exam Awake Alert, Oriented *3, No new F.N deficits, Normal affect [place].AT,PERRAL Supple Neck,No JVD, No cervical lymphadenopathy appriciated.  Symmetrical Chest wall movement, Good air movement bilaterally, CTAB RRR,No Gallops,Rubs or new Murmurs, No Parasternal Heave +ve B.Sounds, Abd Soft, Non tender, No organomegaly appriciated, No rebound -guarding or rigidity. No Cyanosis, Clubbing or edema, No new Rash or bruise  DISCHARGE CONDITION: Stable  DISPOSITION: Home   DISCHARGE INSTRUCTIONS:    Activity:  As tolerated   Diet recommendation: Soft mech Diet  Discharge Orders   Future Appointments Provider Department Dept Phone   01/07/2013 10:30 AM [facility] Covering Provider [facility] [phone]   Future Orders Complete By Expires     Call MD for:  persistant nausea and vomiting  As directed     Call MD for:  severe uncontrolled pain  As directed     Diet - low sodium heart healthy  As directed     Scheduling Instructions:      Dysphagia 3 diet    Increase activity slowly  As directed        Follow-up Information   Follow up with [facility] On 01/07/2013.  (10:30, bring $20 copay, photo id and medications)    Contact information:   [address] [place] [phone]       Follow up with Ernestene Mention, MD. Schedule an appointment as soon as possible for a visit in 1 month.   Contact information:   [address] [place] [address] [phone]         Total Time spent on discharge equals 45 minutes.  SignedJeoffrey Massed 01/01/2013 11:28 AM

## 2013-01-01 NOTE — Progress Notes (Signed)
  Will arrange f/u with Dr. Derrell Lolling, will need colonoscopy in approximately 6 weeks.  Go home on 2 weeks of antibiotics PO maybe Augmentin this time since he was on cipro/flagyl in April and he had recurrence.

## 2013-01-01 NOTE — Progress Notes (Signed)
Nsg Discharge Note  Admit Date:  12/29/2012 Discharge date: 01/01/2013   Allayne Gitelman Lipinski to be D/C'd Home per MD order.  AVS completed.  Copy for chart, and copy for patient signed, and dated. Patient/caregiver able to verbalize understanding.  Discharge Medication:   Medication List    STOP taking these medications       colchicine 0.6 MG tablet     ibuprofen 200 MG tablet  Commonly known as:  ADVIL,MOTRIN      TAKE these medications       albuterol 108 (90 BASE) MCG/ACT inhaler  Commonly known as:  PROVENTIL HFA;VENTOLIN HFA  Inhale 2 puffs into the lungs every 6 (six) hours as needed for wheezing.     allopurinol 300 MG tablet  Commonly known as:  ZYLOPRIM  Take 1 tablet (300 mg total) by mouth at bedtime.     amoxicillin-clavulanate 875-125 MG per tablet  Commonly known as:  AUGMENTIN  Take 1 tablet by mouth 2 (two) times daily.     indomethacin 25 MG capsule  Commonly known as:  INDOCIN  Take 1 capsule (25 mg total) by mouth 3 (three) times daily as needed. For gout flare up     oxyCODONE-acetaminophen 5-325 MG per tablet  Commonly known as:  ROXICET  Take 1 tablet by mouth every 6 (six) hours as needed for pain.        Discharge Assessment: Filed Vitals:   01/01/13 0520  BP: 144/96  Pulse:   Temp:   Resp:    Skin clean, dry and intact without evidence of skin break down, no evidence of skin tears noted. IV catheter discontinued intact. Site without signs and symptoms of complications - no redness or edema noted at insertion site, patient denies c/o pain - only slight tenderness at site.  Dressing with slight pressure applied.  D/c Instructions-Education: Discharge instructions given to patient/family with verbalized understanding. D/c education completed with patient/family including follow up instructions, medication list, d/c activities limitations if indicated, with other d/c instructions as indicated by MD - patient able to verbalize understanding, all  questions fully answered. Patient instructed to return to ED, call 911, or call MD for any changes in condition.  Patient escorted via WC, and D/C home via private auto.  Tonantzin Mimnaugh Consuella Lose, RN 01/01/2013 1:22 PM

## 2013-01-02 LAB — GLUCOSE, CAPILLARY

## 2013-01-07 ENCOUNTER — Ambulatory Visit: Payer: Medicaid Other | Attending: Family Medicine | Admitting: Internal Medicine

## 2013-01-07 VITALS — BP 160/130 | HR 97 | Temp 99.1°F | Resp 18 | Wt 256.8 lb

## 2013-01-07 DIAGNOSIS — K5732 Diverticulitis of large intestine without perforation or abscess without bleeding: Secondary | ICD-10-CM | POA: Insufficient documentation

## 2013-01-07 DIAGNOSIS — M109 Gout, unspecified: Secondary | ICD-10-CM | POA: Insufficient documentation

## 2013-01-07 DIAGNOSIS — J45909 Unspecified asthma, uncomplicated: Secondary | ICD-10-CM | POA: Insufficient documentation

## 2013-01-07 DIAGNOSIS — I10 Essential (primary) hypertension: Secondary | ICD-10-CM

## 2013-01-07 MED ORDER — OXYCODONE-ACETAMINOPHEN 5-325 MG PO TABS: 1.0000 | ORAL_TABLET | Freq: Four times a day (QID) | ORAL | Status: DC | PRN

## 2013-01-07 MED ORDER — ALBUTEROL SULFATE HFA 108 (90 BASE) MCG/ACT IN AERS: 2.0000 | INHALATION_SPRAY | Freq: Four times a day (QID) | RESPIRATORY_TRACT | Status: DC | PRN

## 2013-01-07 MED ORDER — LISINOPRIL-HYDROCHLOROTHIAZIDE 10-12.5 MG PO TABS: 1.0000 | ORAL_TABLET | Freq: Every day | ORAL | Status: DC

## 2013-01-07 MED ORDER — INDOMETHACIN 25 MG PO CAPS: 25.0000 mg | ORAL_CAPSULE | Freq: Three times a day (TID) | ORAL | Status: DC | PRN

## 2013-01-07 MED ORDER — ALLOPURINOL 300 MG PO TABS: 300.0000 mg | ORAL_TABLET | Freq: Every day | ORAL | Status: DC

## 2013-01-07 MED ORDER — CLONIDINE HCL 0.1 MG PO TABS
0.1000 mg | ORAL_TABLET | Freq: Once | ORAL | Status: AC
  Administered 2013-01-07: 0.1 mg via ORAL

## 2013-01-07 NOTE — Progress Notes (Signed)
Patient states no reaction after medication administration. Patient instructed to go home and take it easy and take prescribed medication; patient will return to clinic in one month.

## 2013-01-07 NOTE — Progress Notes (Signed)
Patient ID: Preston [name], male   DOB: December 30, 1970, 43 y.o.   MRN: [id number] Patient Demographics  Preston [name], is a 42 y.o. male  [id number]:[id number]  [id number]:[id number]  DOB - Dec 25, 1970  Chief Complaint  Patient presents with  . Follow-up        Subjective:   Preston [name] today is here to establish primary care. Patient has a  Past Medical History  Diagnosis Date  . Gout   . Hypertension   . Diverticulitis   . Currently patient has no complaints. He was recently discharged from the hospital after a second episode of diverticulitis, he continues to do well, continues to have mild intermittent pain. He is tolerating a diet without any nausea vomiting. He also had a acute gouty flare involving his right knee during his inpatient hospital stay, currently he is pain-free and doing very well in terms of that as well.  Patient has also has No headache, No chest pain, No abdominal pain,No Nausea, No new weakness tingling or numbness, No Cough or SOB.   Objective:    Filed Vitals:   01/07/13 1104  BP: 171/119  Pulse: 97  Temp: 99.1 F (37.3 C)  Resp: 18  Weight: 256 lb 12.8 oz (116.484 kg)  SpO2: 99%     ALLERGIES:   Allergies  Allergen Reactions  . Morphine And Related Other (See Comments)    "bad feeling"    PAST MEDICAL HISTORY: Past Medical History  Diagnosis Date  . Gout   . Hypertension   . Diverticulitis     PAST SURGICAL HISTORY: Past Surgical History  Procedure Laterality Date  . Appendectomy    . Mouth surgery  2010    FAMILY HISTORY: Family History  Problem Relation Age of Onset  . Cancer Father     MEDICATIONS AT HOME: Prior to Admission medications   Medication Sig Start Date End Date Taking? Authorizing Provider  albuterol (PROVENTIL HFA;VENTOLIN HFA) 108 (90 BASE) MCG/ACT inhaler Inhale 2 puffs into the lungs every 6 (six) hours as needed for wheezing. 01/07/13   Preston Bees, MD  allopurinol (ZYLOPRIM) 300 MG tablet Take 1 tablet (300  mg total) by mouth at bedtime. 01/07/13   Shanker Levora Dredge, MD  amoxicillin-clavulanate (AUGMENTIN) 875-125 MG per tablet Take 1 tablet by mouth 2 (two) times daily. 01/01/13   Shanker Levora Dredge, MD  indomethacin (INDOCIN) 25 MG capsule Take 1 capsule (25 mg total) by mouth 3 (three) times daily as needed. For gout flare up 01/07/13   Preston Bees, MD  lisinopril-hydrochlorothiazide (PRINZIDE,ZESTORETIC) 10-12.5 MG per tablet Take 1 tablet by mouth daily. 01/07/13   Shanker Levora Dredge, MD  oxyCODONE-acetaminophen (ROXICET) 5-325 MG per tablet Take 1 tablet by mouth every 6 (six) hours as needed for pain. 01/07/13   Shanker Levora Dredge, MD    SOCIAL HISTORY:   reports that he has been smoking Cigarettes.  He has a 29 pack-year smoking history. He does not have any smokeless tobacco history on file. He reports that  drinks alcohol. He reports that he does not use illicit drugs.  REVIEW OF SYSTEMS:  Constitutional:   No   Fevers, chills, fatigue.  HEENT:    No headaches, Sore throat,   Cardio-vascular: No chest pain,  Orthopnea, swelling in lower extremities, anasarca, palpitations  GI:  No abdominal pain, nausea, vomiting, diarrhea  Resp: No shortness of breath,  No coughing up of blood.No cough.No wheezing.  Skin:  no rash or lesions.  GU:  no dysuria, change in color of urine, no urgency or frequency.  No flank pain.  Musculoskeletal: No joint pain or swelling.  No decreased range of motion.  No back pain.  Psych: No change in mood or affect. No depression or anxiety.  No memory loss.   Exam  General appearance :Awake, alert, not in any distress. Speech Clear. Not toxic Looking HEENT: Atraumatic and Normocephalic, pupils equally reactive to light and accomodation Neck: supple, no JVD. No cervical lymphadenopathy.  Chest:Good air entry bilaterally, no added sounds  CVS: S1 S2 regular, no murmurs.  Abdomen: Bowel sounds present, very minimal tenderness persists in the left  lower abdomen, there is no rebound rigidity or guarding.  Extremities: B/L Lower Ext shows no edema, both legs are warm to touch Neurology: Awake alert, and oriented X 3, CN II-XII intact, Non focal Skin:No Rash Wounds:N/A    Data Review   CBC No results found for this basename: WBC, HGB, HCT, PLT, MCV, MCH, MCHC, RDW, NEUTRABS, LYMPHSABS, MONOABS, EOSABS, BASOSABS, BANDABS, BANDSABD,  in the last 168 hours  Chemistries   No results found for this basename: NA, K, CL, CO2, GLUCOSE, BUN, CREATININE, GFRCGP, CALCIUM, MG, AST, ALT, ALKPHOS, BILITOT,  in the last 168 hours ------------------------------------------------------------------------------------------------------------------ No results found for this basename: HGBA1C,  in the last 72 hours ------------------------------------------------------------------------------------------------------------------ No results found for this basename: CHOL, HDL, LDLCALC, TRIG, CHOLHDL, LDLDIRECT,  in the last 72 hours ------------------------------------------------------------------------------------------------------------------ No results found for this basename: TSH, T4TOTAL, FREET3, T3FREE, THYROIDAB,  in the last 72 hours ------------------------------------------------------------------------------------------------------------------ No results found for this basename: VITAMINB12, FOLATE, FERRITIN, TIBC, IRON, RETICCTPCT,  in the last 72 hours  Coagulation profile  No results found for this basename: INR, PROTIME,  in the last 168 hours    Assessment & Plan   Current diverticulitis - Seems to be doing well post hospital discharge - Continue with Augmentin till  he runs out of that - He has a followup appointment with [facility] [name] [facility] scheduled for 7/25 - He needs a GI evaluation for colonoscopy-I will refer to GI - Does have very mild pain, we'll give him a one-time refill for Percocet  Uncontrolled hypertension -  Start on lisinopril-HCTZ combination - Followup in one month to see how his blood pressure is doing.  Gout - Stable, maintain on allopurinol  Bronchial asthma - Stable - Continue with as needed albuterol inhaler  Return to clinic in one month for BP check  In case he were to have worsening of his abdominal pain with severe nausea or vomiting-patient was advised to go to the emergency room. He came understanding.

## 2013-01-07 NOTE — Progress Notes (Signed)
Patient here for hospital follow up diverticulitis

## 2013-01-12 ENCOUNTER — Telehealth (INDEPENDENT_AMBULATORY_CARE_PROVIDER_SITE_OTHER): Payer: Self-pay

## 2013-01-12 NOTE — Telephone Encounter (Signed)
I left a message for the patient to call.  I was asked to schedule a 2 wk f/u.  I have an opening on 6/18 at 2:30 or 6/26 at 0930.  He took the one on 6/18 when he called me back.

## 2013-01-14 ENCOUNTER — Encounter (INDEPENDENT_AMBULATORY_CARE_PROVIDER_SITE_OTHER): Payer: Self-pay | Admitting: General Surgery

## 2013-01-14 ENCOUNTER — Ambulatory Visit (INDEPENDENT_AMBULATORY_CARE_PROVIDER_SITE_OTHER): Payer: Medicaid Other | Admitting: General Surgery

## 2013-01-14 VITALS — BP 150/100 | HR 94 | Temp 96.4°F | Resp 17 | Ht 75.0 in | Wt 258.6 lb

## 2013-01-14 DIAGNOSIS — K5732 Diverticulitis of large intestine without perforation or abscess without bleeding: Secondary | ICD-10-CM

## 2013-01-14 DIAGNOSIS — Z720 Tobacco use: Secondary | ICD-10-CM

## 2013-01-14 DIAGNOSIS — K5792 Diverticulitis of intestine, part unspecified, without perforation or abscess without bleeding: Secondary | ICD-10-CM

## 2013-01-14 NOTE — Progress Notes (Signed)
Patient ID: Preston [name], male   DOB: 12/24/1970, 42 y.o.   MRN: [id number] History: This gentleman returns to see me regarding his presumed diverticulitis. He was hospitalized with acute diverticulitis and a small pericolonic abscess in April. He was discharged and followup with me in the office. He was referred to GI but did not go. He was hospitalized again on June 3.  through June 5. CT scan showed inflammatory changes but the abscess had resolved. He's taking antibiotics for 2 weeks. He is now status post with Dr. Jeoffrey Massed. He has been referred to gastroenterology and has an appointment on July 1. He thinks this is Dr. Chrissie Noa outlaw. He is feeling pretty good. Appetite normal. Bowel movements normal. Still feels a little pain in the left lower quadrant. Voiding normally.  Exam: Patient looks well. Overweight. No distress. Lungs clear auscultation bilaterally Heart regular rate and rhythm. No ectopy. A tachycardia Abdomen obese. Soft. Subjectively tender left lower quadrant but no guarding no mass no distention no inguinal discomfort.  Assessment sigmoid diverticulitis, 2 episodes requiring hospitalization small pericolonic abscess on first hospitalization resolved on second hospitalization There is no indication for surgical intervention at this point He needs colonoscopy to rule out neoplasia  Plan: High fiber low fat diet. Forced hydration. Metamucil twice a day Strongly urged to discontinue smoking To see  gastroenterology on July 1. Colonoscopy is indicated in 6 weeks or so Return to see me if surgical problems arise.   Angelia Mould. Derrell Lolling, M.D., [facility] General and Minimally invasive Surgery Breast and Colorectal Surgery Office:   [phone] Pager:   [phone]

## 2013-01-14 NOTE — Patient Instructions (Signed)
You appear to be recovering from your second episode of diverticulitis. The most recent CT scan shows that the abscess has essentially resolved. There is no indication for surgery at this point in time.  Follow a high-fiber, low-fat diet. Drink 6-8 glasses of water a day and it Take four(4) Metamucil capsules with a large glass of water twice a day.  Be sure to keep your appointment  with the gastroenterologist on July 1.. You need to have a colonoscopy.  Return to see Dr. Derrell Lolling if further problems arise.

## 2013-02-03 ENCOUNTER — Emergency Department (HOSPITAL_COMMUNITY): Payer: Medicaid Other

## 2013-02-03 ENCOUNTER — Ambulatory Visit (HOSPITAL_BASED_OUTPATIENT_CLINIC_OR_DEPARTMENT_OTHER): Payer: Medicaid Other | Admitting: Family Medicine

## 2013-02-03 ENCOUNTER — Encounter (HOSPITAL_COMMUNITY): Payer: Self-pay | Admitting: Physical Medicine and Rehabilitation

## 2013-02-03 ENCOUNTER — Emergency Department (HOSPITAL_COMMUNITY)
Admission: EM | Admit: 2013-02-03 | Discharge: 2013-02-03 | Disposition: A | Discharge status: Home / Self Care | Payer: Medicaid Other | Attending: Emergency Medicine | Admitting: Emergency Medicine

## 2013-02-03 VITALS — BP 184/114 | HR 90 | Temp 99.1°F | Resp 20 | Ht 73.0 in | Wt 257.0 lb

## 2013-02-03 DIAGNOSIS — R110 Nausea: Secondary | ICD-10-CM | POA: Insufficient documentation

## 2013-02-03 DIAGNOSIS — R3915 Urgency of urination: Secondary | ICD-10-CM | POA: Insufficient documentation

## 2013-02-03 DIAGNOSIS — R109 Unspecified abdominal pain: Secondary | ICD-10-CM | POA: Insufficient documentation

## 2013-02-03 DIAGNOSIS — Z8639 Personal history of other endocrine, nutritional and metabolic disease: Secondary | ICD-10-CM | POA: Insufficient documentation

## 2013-02-03 DIAGNOSIS — Z862 Personal history of diseases of the blood and blood-forming organs and certain disorders involving the immune mechanism: Secondary | ICD-10-CM | POA: Insufficient documentation

## 2013-02-03 DIAGNOSIS — F17200 Nicotine dependence, unspecified, uncomplicated: Secondary | ICD-10-CM

## 2013-02-03 DIAGNOSIS — Z720 Tobacco use: Secondary | ICD-10-CM

## 2013-02-03 DIAGNOSIS — K5730 Diverticulosis of large intestine without perforation or abscess without bleeding: Secondary | ICD-10-CM

## 2013-02-03 DIAGNOSIS — M109 Gout, unspecified: Secondary | ICD-10-CM

## 2013-02-03 DIAGNOSIS — I10 Essential (primary) hypertension: Secondary | ICD-10-CM

## 2013-02-03 DIAGNOSIS — K5790 Diverticulosis of intestine, part unspecified, without perforation or abscess without bleeding: Secondary | ICD-10-CM | POA: Insufficient documentation

## 2013-02-03 DIAGNOSIS — K921 Melena: Secondary | ICD-10-CM | POA: Insufficient documentation

## 2013-02-03 DIAGNOSIS — Z8719 Personal history of other diseases of the digestive system: Secondary | ICD-10-CM | POA: Insufficient documentation

## 2013-02-03 DIAGNOSIS — Z79899 Other long term (current) drug therapy: Secondary | ICD-10-CM | POA: Insufficient documentation

## 2013-02-03 LAB — COMPREHENSIVE METABOLIC PANEL
BUN: 6 mg/dL (ref 6–23)
CO2: 28 mEq/L (ref 19–32)
Chloride: 97 mEq/L (ref 96–112)
Creatinine, Ser: 0.62 mg/dL (ref 0.50–1.35)
GFR calc non Af Amer: >90 mL/min (ref >90)
Total Bilirubin: 0.5 mg/dL (ref 0.3–1.2)

## 2013-02-03 LAB — LIPASE, BLOOD: Lipase: 27 U/L (ref 11–59)

## 2013-02-03 LAB — CBC WITH DIFFERENTIAL/PLATELET
Basophils Relative: 0 % (ref 0–1)
Eosinophils Relative: 1 % (ref 0–5)
HCT: 46.2 % (ref 39.0–52.0)
Hemoglobin: 16 g/dL (ref 13.0–17.0)
Lymphocytes Relative: 11 % — ABNORMAL LOW (ref 12–46)
MCHC: 34.6 g/dL (ref 30.0–36.0)
MCV: 91.1 fL (ref 78.0–100.0)
Monocytes Absolute: 0.9 10*3/uL (ref 0.1–1.0)
Monocytes Relative: 5 % (ref 3–12)
Neutro Abs: 13.6 10*3/uL — ABNORMAL HIGH (ref 1.7–7.7)

## 2013-02-03 MED ORDER — HYDROMORPHONE HCL PF 1 MG/ML IJ SOLN
1.0000 mg | Freq: Once | INTRAMUSCULAR | Status: AC
  Administered 2013-02-03: 1 mg via INTRAVENOUS
  Filled 2013-02-03: qty 1

## 2013-02-03 MED ORDER — IOHEXOL 300 MG/ML  SOLN: 100.0000 mL | Freq: Once | INTRAMUSCULAR | Status: DC | PRN

## 2013-02-03 MED ORDER — IOHEXOL 300 MG/ML  SOLN
25.0000 mL | INTRAMUSCULAR | Status: AC
  Administered 2013-02-03: 25 mL via ORAL

## 2013-02-03 MED ORDER — SODIUM CHLORIDE 0.9 % IV SOLN
INTRAVENOUS | Status: DC
  Administered 2013-02-03: 14:00:00 via INTRAVENOUS

## 2013-02-03 MED ORDER — CIPROFLOXACIN HCL 500 MG PO TABS: 500.0000 mg | ORAL_TABLET | Freq: Two times a day (BID) | ORAL | Status: DC

## 2013-02-03 MED ORDER — METRONIDAZOLE 500 MG PO TABS: 500.0000 mg | ORAL_TABLET | Freq: Two times a day (BID) | ORAL | Status: DC

## 2013-02-03 MED ORDER — HYDROCODONE-ACETAMINOPHEN 5-325 MG PO TABS: 2.0000 | ORAL_TABLET | Freq: Four times a day (QID) | ORAL | Status: DC | PRN

## 2013-02-03 MED ORDER — IOHEXOL 300 MG/ML  SOLN
100.0000 mL | Freq: Once | INTRAMUSCULAR | Status: AC | PRN
  Administered 2013-02-03: 100 mL via INTRAVENOUS

## 2013-02-03 NOTE — ED Provider Notes (Signed)
History    CSN: [id number] Arrival date & time 02/03/13  1252  First MD Initiated Contact with Patient 02/03/13 1316     Chief Complaint  Patient presents with  . Abdominal Pain    HPI  Pt is a 42 yo M with pmh of gout, HTN, and two episodes of diverticulitis who presents with worsening lower abdominal pain. Pt reports the pain is located below his umbilical region and on both sides that is constant and fluctuates in intensity, at the moment it is 9/10 and is better with antibiotics and pain medication, and worsened with movement. Pt also with nausea but no emesis. His BM is mucous in consistency with presence of red and dark stools about 2 weeks ago. Pt also reports his abdomen is distended and increased urinary urgency with it. He reports following diet for diverticulitis given after hospital discharge. No fevers or chills. Has h/o of gout for past 2 years and is on allopurinol. He denies mouth ulcers, vision changes, weight change, or skin rashes.           Pt's symptoms first began in November. He had two episodes of diverticulitis, once in April that was complicated by abscess and was treated with antibiotics. He then had a second episode in May, also treated with antibiotics (amoxicilin?). Pt to see GI and have colonoscopy performed once inflammation subsides for workup of possible IBD and malignancy.         Past Medical History  Diagnosis Date  . Gout   . Hypertension   . Diverticulitis    Past Surgical History  Procedure Laterality Date  . Appendectomy    . Mouth surgery  2010   Family History  Problem Relation Age of Onset  . Cancer Father    History  Substance Use Topics  . Smoking status: Current Every Day Smoker -- 1.00 packs/day for 29 years    Types: Cigarettes  . Smokeless tobacco: Not on file  . Alcohol Use: 3.6 oz/week    6 Cans of beer per week    Review of Systems  Constitutional: Positive for diaphoresis (night sweating) and appetite change. Negative  for fever, fatigue and unexpected weight change.  HENT: Negative.   Eyes: Negative.   Respiratory: Negative.   Cardiovascular: Negative.   Gastrointestinal: Positive for nausea, abdominal pain (lower abdomen), blood in stool (red and dark 2 weeks ago) and abdominal distention. Negative for vomiting and rectal pain.  Endocrine: Negative.   Genitourinary: Positive for urgency.       Incomplete bladder empyting when abdomen distended   Musculoskeletal: Positive for joint swelling (gout of both 1st MTP joint and both knees).       Hip pain  Skin:       Rash located in left lower abdominal region near groin   Allergic/Immunologic: Negative.   Neurological: Negative.     Allergies  Morphine and related  Home Medications   Current Outpatient Rx  Name  Route  Sig  Dispense  Refill  . albuterol (PROVENTIL HFA;VENTOLIN HFA) 108 (90 BASE) MCG/ACT inhaler   Inhalation   Inhale 2 puffs into the lungs every 6 (six) hours as needed for wheezing.         [name] allopurinol (ZYLOPRIM) 300 MG tablet   Oral   Take 300 mg by mouth at bedtime.         . Aspirin-Acetaminophen-Caffeine (GOODY HEADACHE PO)   Oral   Take 1 packet by mouth once.         [name]  hydrochlorothiazide (MICROZIDE) 12.5 MG capsule   Oral   Take 12.5 mg by mouth daily.         [name] ibuprofen (ADVIL,MOTRIN) 200 MG tablet   Oral   Take 800 mg by mouth every 6 (six) hours as needed for pain.         . indomethacin (INDOCIN) 25 MG capsule   Oral   Take 25 mg by mouth at bedtime. For gout flare up          BP 187/118  Pulse 96  Temp(Src) 98.5 F (36.9 C) (Oral)  Resp 20  SpO2 98% Physical Exam  Constitutional: He appears well-developed and well-nourished. No distress.  Obese, sitting up comfortably in bed  HENT:  Head: Normocephalic and atraumatic.  No mouth ulcers  Eyes: EOM are normal. Pupils are equal, round, and reactive to light.  Neck: Normal range of motion. Neck supple.  Cardiovascular: Normal rate,  regular rhythm and normal heart sounds.   Pulmonary/Chest: Effort normal. No respiratory distress. He has wheezes (scattered expiratory wheezing).  Abdominal: Bowel sounds are normal. He exhibits distension. There is tenderness. There is no rebound and no guarding.  Diffuse abdominal pain, worse at lower abdomen on both sides   Musculoskeletal:  Inflammation of 1st MTP b/l and knees b/l   Skin: Skin is warm and dry. He is not diaphoretic.  1 inch circular raised erythematous area in left lower abdomen near pubis      ED Course  Procedures (including critical care time) Labs Reviewed  CBC WITH DIFFERENTIAL - Abnormal; Notable for the following:    WBC 16.5 (*)    Neutrophils Relative % 82 (*)    Neutro Abs 13.6 (*)    Lymphocytes Relative 11 (*)    All other components within normal limits  COMPREHENSIVE METABOLIC PANEL - Abnormal; Notable for the following:    Glucose, Bld 113 (*)    All other components within normal limits  URINE CULTURE  LIPASE, BLOOD  CBC WITH DIFFERENTIAL  COMPREHENSIVE METABOLIC PANEL   Ct Abdomen Pelvis W Contrast  02/03/2013   *RADIOLOGY REPORT*  Clinical Data: Abdominal pain.  History of diverticulitis.  CT ABDOMEN AND PELVIS WITH CONTRAST  Technique:  Multidetector CT imaging of the abdomen and pelvis was performed following the standard protocol during bolus administration of intravenous contrast.  Contrast: OMNIPAQUE IOHEXOL 300 MG/ML  SOLN  Comparison: CT abdomen and pelvis 12/29/2012, 12/17/2012, and 11/14/2012.  Findings: The lung bases are clear without focal nodule, mass, or airspace disease.  The heart size is normal.  No significant pleural or pericardial effusion is present.  Mild fatty infiltration is present throughout the liver.  The spleen is within normal limits.  No discrete lesions are present. The stomach, duodenum, and pancreas are within normal limits.  The common bile duct and gallbladder are normal.  The adrenal glands are normal  bilaterally.  The kidneys and ureters are within normal limits bilaterally.  There are persistent inflammatory changes surrounding the sigmoid colon.  No discrete abscess or definite perforation is evident. There is no significant free fluid.  The more proximal colon is within normal limits.  The appendix is not discretely visualized and may be surgically absent.  Small bowel is somewhat collapsed distally but without obstruction.  Reactive sized retroperitoneal lymph nodes are stable.  The bone windows demonstrate slight rightward curvature of the thoracolumbar junction.  Fused anterior osteophytes are present.  Age advanced degenerative changes are noted in the hips, right  greater than left.  IMPRESSION:  1.  Persistent or recurrent to sigmoid colitis associated diverticular disease.  There is slightly increased to inflammation along the lateral margin. 2.  No discrete abscess or definite perforation. 3.  Inflammatory retroperitoneal lymph nodes. 4. Degenerative changes of the lumbar spine and hips as described.   Original Report Authenticated By: Marin Roberts, M.D.   No diagnosis found.  MDM  Assessment: 42 yo M with pmh of gout, HTN, and two episodes of prior diverticulitis complicated with abscess who presents with worsening lower abdominal pain.   Plan:    Abdominal pain -- Diverticulitis complicated by abscess/ preforation vs sigmoid colitis vs intestinal obstruction  -Obtain CBC w. Diff ---> leukocytosis with left shift ---> most likely due to persistent inflammation of sigmoid colon   -Obtain CMP---> WNL -Obtain lipase --> WNL -Obtain UA-->  Awaiting results  -Obtain CT w/cont abdomen ---> persistent or recurrent sigmoid colitis, no discrete abscess or perforation  -[place] NS 189ml/hr for hypovolemia    -Administer IV Dilaudid 1mg  for pain ---> pain relief       Disposition: Home  ---> Pt is afebrile with leukocytosis and persistent diverticulitis with no evidence of abscess  or perforation whose pain is improved with IV dilaudid.    Discharge Instructions:  To take flagyl 500mg  BID & cipro 500mg  BID for 7 days To take 2 tabs norco 5-325mg  Q6 hrs as needed for pain  To be follow-up with GI with Dr. Dulce Sellar for possible colonoscopy to workup other etiologies of recurrent abdominal pain.     Otis Brace, MD 02/03/13 1717

## 2013-02-03 NOTE — Progress Notes (Signed)
Per doctor orders patient transferred to [facility] ED; report given to [place].

## 2013-02-03 NOTE — Progress Notes (Signed)
Patient ID: Preston [name], male   DOB: July 04, 1971, 42 y.o.   MRN: [id number]  CC: Followup  HPI: Pt reports that he is having worse abdominal pain than when he originally got admitted with diverticulitis and abscess.  He says his pain is 9/10.  Pt reports some nausea but no emesis.  Pt denies fever or chills.  The patient is presenting today to followup for his hypertension and diverticulitis.  He followed up with his surgeon and was told that he did not need to return unless there was a surgical issue.  He is also scheduled to see a gastroenterologist for colonoscopy in a few weeks.   Allergies  Allergen Reactions  . Morphine And Related Other (See Comments)    "bad feeling"   Past Medical History  Diagnosis Date  . Gout   . Hypertension   . Diverticulitis    Current Outpatient Prescriptions on File Prior to Visit  Medication Sig Dispense Refill  . albuterol (PROVENTIL HFA;VENTOLIN HFA) 108 (90 BASE) MCG/ACT inhaler Inhale 2 puffs into the lungs every 6 (six) hours as needed for wheezing.  18 g  3  . allopurinol (ZYLOPRIM) 300 MG tablet Take 1 tablet (300 mg total) by mouth at bedtime.  30 tablet  3  . indomethacin (INDOCIN) 25 MG capsule Take 1 capsule (25 mg total) by mouth 3 (three) times daily as needed. For gout flare up  20 capsule  2  . lisinopril-hydrochlorothiazide (PRINZIDE,ZESTORETIC) 10-12.5 MG per tablet Take 1 tablet by mouth daily.  90 tablet  3   No current facility-administered medications on file prior to visit.   Family History  Problem Relation Age of Onset  . Cancer Father    History   Social History  . Marital Status: Single    Spouse Name: N/A    Number of Children: N/A  . Years of Education: N/A   Occupational History  . Not on file.   Social History Main Topics  . Smoking status: Current Every Day Smoker -- 1.00 packs/day for 29 years    Types: Cigarettes  . Smokeless tobacco: Not on file  . Alcohol Use: Yes     Comment: nothing in 6weeks  .  Drug Use: No  . Sexually Active: Yes   Other Topics Concern  . Not on file   Social History Narrative  . No narrative on file    Review of Systems  Constitutional: Negative for fever, chills, diaphoresis, activity change, appetite change and fatigue.  HENT: Negative for ear pain, nosebleeds, congestion, facial swelling, rhinorrhea, neck pain, neck stiffness and ear discharge.   Eyes: Negative for pain, discharge, redness, itching and visual disturbance.  Respiratory: Negative for cough, choking, chest tightness, shortness of breath, wheezing and stridor.   Cardiovascular: Negative for chest pain, palpitations and leg swelling.  Gastrointestinal: Negative for abdominal distention.  Genitourinary: Negative for dysuria, urgency, frequency, hematuria, flank pain, decreased urine volume, difficulty urinating and dyspareunia.  Musculoskeletal: Negative for back pain, joint swelling, arthralgias and gait problem.  Neurological: Negative for dizziness, tremors, seizures, syncope, facial asymmetry, speech difficulty, weakness, light-headedness, numbness and headaches.  Hematological: Negative for adenopathy. Does not bruise/bleed easily.  Psychiatric/Behavioral: Negative for hallucinations, behavioral problems, confusion, dysphoric mood, decreased concentration and agitation.    Objective:  Vitals reviewed in the electronic record  Physical Exam  Constitutional: Appears well-developed and well-nourished. No distress.  HENT: Normocephalic. External right and left ear normal. Oropharynx is clear and moist.  Eyes: Conjunctivae and EOM  are normal. PERRLA, no scleral icterus.  Neck: Normal ROM. Neck supple. No JVD. No tracheal deviation. No thyromegaly.  CVS: RRR, S1/S2 +, no murmurs, no gallops, no carotid bruit.  Pulmonary: Effort and breath sounds normal, no stridor, rhonchi, wheezes, rales.  Abdominal: Soft. BS +,  no distension, mild suprapubic TTP, no rebound or guarding.  Musculoskeletal:  Normal range of motion. No edema and no tenderness.  Lymphadenopathy: No lymphadenopathy noted, cervical, inguinal. Neuro: Alert. Normal reflexes, muscle tone coordination. No cranial nerve deficit. Skin: Skin is warm and dry. No rash noted. Not diaphoretic. No erythema. No pallor.  Psychiatric: Normal mood and affect. Behavior, judgment, thought content normal.   Lab Results  Component Value Date   WBC 11.4* 12/31/2012   HGB 13.6 12/31/2012   HCT 40.9 12/31/2012   MCV 90.9 12/31/2012   PLT 263 12/31/2012   Lab Results  Component Value Date   CREATININE 0.64 12/31/2012   BUN 4* 12/31/2012   NA 139 12/31/2012   K 4.0 12/31/2012   CL 105 12/31/2012   CO2 25 12/31/2012    No results found for this basename: HGBA1C   Lipid Panel  No results found for this basename: chol, trig, hdl, cholhdl, vldl, ldlcalc       Assessment and plan:   Patient Active Problem List   Diagnosis Date Noted  . Diverticulosis 02/03/2013  . Tobacco abuse 12/30/2012  . HTN (hypertension) 11/14/2012  . Gout    Severe abdominal pain - abdominal exam is benign today, since pt reporting pain is worse than when he was admitted, will send to ER for further evaluation.   Increase blood pressure medications,  Increase zestoretic to 20/12.5 - take 1 po daily, add amlodipine 5 mg po daily.  The patient was counseled on the dangers of tobacco use, and was advised to quit.  Reviewed strategies to maximize success, including removing cigarettes and smoking materials from environment.  RTC after he has been evalated in ER Follow up with GI on 7/18 as scheduled  The patient was given clear instructions to go to ER or return to medical center if symptoms don't improve, worsen or new problems develop.  The patient verbalized understanding.  The patient was told to call to get any lab results if not heard anything in the next week.    Rodney Langton, MD, CDE, FAAFP [facility] [place], [place]

## 2013-02-03 NOTE — Patient Instructions (Addendum)
GO over to the ER for further evaluation right now    Gout Gout is caused by a buildup of uric acid crystals in the joints. The crystals make your joints sore. This is like having sand in your joints. Repeat attacks are common. Gout can be treated. HOME CARE   Do not take aspirin for pain.  Only take medicine as told by your doctor.  You may use cold treatments (ice) on painful joints.  Put ice in a plastic bag.  Place a towel between your skin and the bag.  Leave the ice on for 15-20 minutes at a time, 3-4 times a day.  Rest in bed as much as possible. When in bed, keep the sheets and blankets off your sore joints.  Keep the sore joints raised (elevated).  Use crutches if your legs or ankles hurt.  Drink enough water and fluids to keep your pee (urine) clear or pale yellow. This helps your body get rid of uric acid. Do not drink alcohol.  Follow diet instructions as told by your doctor.  Keep your body at a healthy weight. GET HELP RIGHT AWAY IF:   You have a temperature by mouth above 102 F (38.9 C), not controlled by medicine.  You have watery poop (diarrhea).  You are throwing up (vomiting).  You do not feel better in 1 day, or you are getting worse.  Your joint hurts more.  You have the chills. MAKE SURE YOU:   Understand these instructions.  Will watch your condition.  Will get help right away if you are not doing well or get worse. Document Released: 04/24/2008 Document Revised: 10/08/2011 Document Reviewed: 10/24/2009 [facility] Patient Information 2014 [place], [place]. Hypertension As your heart beats, it forces blood through your arteries. This force is your blood pressure. If the pressure is too high, it is called hypertension (HTN) or high blood pressure. HTN is dangerous because you may have it and not know it. High blood pressure may mean that your heart has to work harder to pump blood. Your arteries may be narrow or stiff. The extra work puts you  at risk for heart disease, stroke, and other problems.  Blood pressure consists of two numbers, a higher number over a lower, 110/72, for example. It is stated as "110 over 72." The ideal is below 120 for the top number (systolic) and under 80 for the bottom (diastolic). Write down your blood pressure today. You should pay close attention to your blood pressure if you have certain conditions such as:  Heart failure.  Prior heart attack.  Diabetes  Chronic kidney disease.  Prior stroke.  Multiple risk factors for heart disease. To see if you have HTN, your blood pressure should be measured while you are seated with your arm held at the level of the heart. It should be measured at least twice. A one-time elevated blood pressure reading (especially in the Emergency Department) does not mean that you need treatment. There may be conditions in which the blood pressure is different between your right and left arms. It is important to see your caregiver soon for a recheck. Most people have essential hypertension which means that there is not a specific cause. This type of high blood pressure may be lowered by changing lifestyle factors such as:  Stress.  Smoking.  Lack of exercise.  Excessive weight.  Drug/tobacco/alcohol use.  Eating less salt. Most people do not have symptoms from high blood pressure until it has caused damage to  the body. Effective treatment can often prevent, delay or reduce that damage. TREATMENT  When a cause has been identified, treatment for high blood pressure is directed at the cause. There are a large number of medications to treat HTN. These fall into several categories, and your caregiver will help you select the medicines that are best for you. Medications may have side effects. You should review side effects with your caregiver. If your blood pressure stays high after you have made lifestyle changes or started on medicines,   Your medication(s) may need to  be changed.  Other problems may need to be addressed.  Be certain you understand your prescriptions, and know how and when to take your medicine.  Be sure to follow up with your caregiver within the time frame advised (usually within two weeks) to have your blood pressure rechecked and to review your medications.  If you are taking more than one medicine to lower your blood pressure, make sure you know how and at what times they should be taken. Taking two medicines at the same time can result in blood pressure that is too low. SEEK IMMEDIATE MEDICAL CARE IF:  You develop a severe headache, blurred or changing vision, or confusion.  You have unusual weakness or numbness, or a faint feeling.  You have severe chest or abdominal pain, vomiting, or breathing problems. MAKE SURE YOU:   Understand these instructions.  Will watch your condition.  Will get help right away if you are not doing well or get worse. Document Released: 07/16/2005 Document Revised: 10/08/2011 Document Reviewed: 03/05/2008 [facility] Patient Information 2014 [place], [place]. Smoking Cessation, Tips for Success YOU CAN QUIT SMOKING If you are ready to quit smoking, congratulations! You have chosen to help yourself be healthier. Cigarettes bring nicotine, tar, carbon monoxide, and other irritants into your body. Your lungs, heart, and blood vessels will be able to work better without these poisons. There are many different ways to quit smoking. Nicotine gum, nicotine patches, a nicotine inhaler, or nicotine nasal spray can help with physical craving. Hypnosis, support groups, and medicines help break the habit of smoking. Here are some tips to help you quit for good.  Throw away all cigarettes.  Clean and remove all ashtrays from your home, work, and car.  On a card, write down your reasons for quitting. Carry the card with you and read it when you get the urge to smoke.  Cleanse your body of nicotine. Drink enough  water and fluids to keep your urine clear or pale yellow. Do this after quitting to flush the nicotine from your body.  Learn to predict your moods. Do not let a bad situation be your excuse to have a cigarette. Some situations in your life might tempt you into wanting a cigarette.  Never have "just one" cigarette. It leads to wanting another and another. Remind yourself of your decision to quit.  Change habits associated with smoking. If you smoked while driving or when feeling stressed, try other activities to replace smoking. Stand up when drinking your coffee. Brush your teeth after eating. Sit in a different chair when you read the paper. Avoid alcohol while trying to quit, and try to drink fewer caffeinated beverages. Alcohol and caffeine may urge you to smoke.  Avoid foods and drinks that can trigger a desire to smoke, such as sugary or spicy foods and alcohol.  Ask people who smoke not to smoke around you.  Have something planned to do right after eating  or having a cup of coffee. Take a walk or exercise to perk you up. This will help to keep you from overeating.  Try a relaxation exercise to calm you down and decrease your stress. Remember, you may be tense and nervous for the first 2 weeks after you quit, but this will pass.  Find new activities to keep your hands busy. Play with a pen, coin, or rubber band. Doodle or draw things on paper.  Brush your teeth right after eating. This will help cut down on the craving for the taste of tobacco after meals. You can try mouthwash, too.  Use oral substitutes, such as lemon drops, carrots, a cinnamon stick, or chewing gum, in place of cigarettes. Keep them handy so they are available when you have the urge to smoke.  When you have the urge to smoke, try deep breathing.  Designate your home as a nonsmoking area.  If you are a heavy smoker, ask your caregiver about a prescription for nicotine chewing gum. It can ease your withdrawal from  nicotine.  Reward yourself. Set aside the cigarette money you save and buy yourself something nice.  Look for support from others. Join a support group or smoking cessation program. Ask someone at home or at work to help you with your plan to quit smoking.  Always ask yourself, "Do I need this cigarette or is this just a reflex?" Tell yourself, "Today, I choose not to smoke," or "I do not want to smoke." You are reminding yourself of your decision to quit, even if you do smoke a cigarette. HOW WILL I FEEL WHEN I QUIT SMOKING?  The benefits of not smoking start within days of quitting.  You may have symptoms of withdrawal because your body is used to nicotine (the addictive substance in cigarettes). You may crave cigarettes, be irritable, feel very hungry, cough often, get headaches, or have difficulty concentrating.  The withdrawal symptoms are only temporary. They are strongest when you first quit but will go away within 10 to 14 days.  When withdrawal symptoms occur, stay in control. Think about your reasons for quitting. Remind yourself that these are signs that your body is healing and getting used to being without cigarettes.  Remember that withdrawal symptoms are easier to treat than the major diseases that smoking can cause.  Even after the withdrawal is over, expect periodic urges to smoke. However, these cravings are generally short-lived and will go away whether you smoke or not. Do not smoke!  If you relapse and smoke again, do not lose hope. Most smokers quit 3 times before they are successful.  If you relapse, do not give up! Plan ahead and think about what you will do the next time you get the urge to smoke. LIFE AS A NONSMOKER: MAKE IT FOR A MONTH, MAKE IT FOR LIFE Day 1: Hang this page where you will see it every day. Day 2: Get rid of all ashtrays, matches, and lighters. Day 3: Drink water. Breathe deeply between sips. Day 4: Avoid places with smoke-filled air, such as  bars, clubs, or the smoking section of restaurants. Day 5: Keep track of how much money you save by not smoking. Day 6: Avoid boredom. Keep a good book with you or go to the movies. Day 7: Reward yourself! One week without smoking! Day 8: Make a dental appointment to get your teeth cleaned. Day 9: Decide how you will turn down a cigarette before it is offered to you.  Day 10: Review your reasons for quitting. Day 11: Distract yourself. Stay active to keep your mind off smoking and to relieve tension. Take a walk, exercise, read a book, do a crossword puzzle, or try a new hobby. Day 12: Exercise. Get off the bus before your stop or use stairs instead of escalators. Day 13: Call on friends for support and encouragement. Day 14: Reward yourself! Two weeks without smoking! Day 15: Practice deep breathing exercises. Day 16: Bet a friend that you can stay a nonsmoker. Day 17: Ask to sit in nonsmoking sections of restaurants. Day 18: Hang up "No Smoking" signs. Day 19: Think of yourself as a nonsmoker. Day 20: Each morning, tell yourself you will not smoke. Day 21: Reward yourself! Three weeks without smoking! Day 22: Think of smoking in negative ways. Remember how it stains your teeth, gives you bad breath, and leaves you short of breath. Day 23: Eat a nutritious breakfast. Day 24:Do not relive your days as a smoker. Day 25: Hold a pencil in your hand when talking on the telephone. Day 26: Tell all your friends you do not smoke. Day 27: Think about how much better food tastes. Day 28: Remember, one cigarette is one too many. Day 29: Take up a hobby that will keep your hands busy. Day 30: Congratulations! One month without smoking! Give yourself a big reward. Your caregiver can direct you to community resources or hospitals for support, which may include:  Group support.  Education.  Hypnosis.  Subliminal therapy. Document Released: 04/13/2004 Document Revised: 10/08/2011 Document  Reviewed: 05/02/2009 [facility] Patient Information 2014 [place], [place].

## 2013-02-03 NOTE — ED Notes (Signed)
Pt presents to department for evaluation of lower abdominal pain and nausea. Ongoing since April. History of divertulitis. 8/10 pain at the time. Pt is alert and oriented x4.

## 2013-02-03 NOTE — Progress Notes (Signed)
Patient states he started having abdominal pain about a week and a half ago and noticed a lump in lower left quadrant of abdomen around belt line; states pain is constant with sharp shooting pains that give him feelings of having to have a bowel movement when pain is shooting.

## 2013-02-03 NOTE — ED Provider Notes (Signed)
I saw and evaluated the patient, reviewed the resident's note and I agree with the findings and plan.  Subjective: Patient with abdominal pain since April localized to his suprapubic/lower epigastric region.  Objective: Patient low-grade temperature and mild hypertension. Abdominal exam is nonsurgical.  Assessment/plan: Patient to have abdominal CT.  Toy Baker, MD 02/03/13 [phone]

## 2013-02-03 NOTE — ED Notes (Signed)
Family at bedside. 

## 2013-02-04 LAB — URINE CULTURE

## 2013-02-04 NOTE — ED Provider Notes (Signed)
I saw and evaluated the patient, reviewed the resident's note and I agree with the findings and plan.  Tryce Surratt T Preston Garabedian, MD 02/04/13 0747 

## 2013-02-06 ENCOUNTER — Ambulatory Visit: Payer: Medicaid Other

## 2013-02-20 ENCOUNTER — Encounter (INDEPENDENT_AMBULATORY_CARE_PROVIDER_SITE_OTHER): Payer: Medicaid Other | Admitting: General Surgery

## 2013-02-26 ENCOUNTER — Encounter (HOSPITAL_COMMUNITY): Payer: Self-pay | Admitting: Cardiology

## 2013-02-26 ENCOUNTER — Inpatient Hospital Stay (HOSPITAL_COMMUNITY)
Admission: EM | Admit: 2013-02-26 | Discharge: 2013-03-01 | DRG: 392 | Disposition: A | Discharge status: Home / Self Care | Payer: Medicaid Other | Attending: Internal Medicine | Admitting: Internal Medicine

## 2013-02-26 ENCOUNTER — Emergency Department (HOSPITAL_COMMUNITY): Payer: Medicaid Other

## 2013-02-26 DIAGNOSIS — F17200 Nicotine dependence, unspecified, uncomplicated: Secondary | ICD-10-CM | POA: Diagnosis present

## 2013-02-26 DIAGNOSIS — K5732 Diverticulitis of large intestine without perforation or abscess without bleeding: Principal | ICD-10-CM | POA: Diagnosis present

## 2013-02-26 DIAGNOSIS — M109 Gout, unspecified: Secondary | ICD-10-CM | POA: Diagnosis present

## 2013-02-26 DIAGNOSIS — IMO0002 Reserved for concepts with insufficient information to code with codable children: Secondary | ICD-10-CM

## 2013-02-26 DIAGNOSIS — R109 Unspecified abdominal pain: Secondary | ICD-10-CM

## 2013-02-26 DIAGNOSIS — I10 Essential (primary) hypertension: Secondary | ICD-10-CM | POA: Diagnosis present

## 2013-02-26 DIAGNOSIS — Z720 Tobacco use: Secondary | ICD-10-CM

## 2013-02-26 DIAGNOSIS — I161 Hypertensive emergency: Secondary | ICD-10-CM | POA: Diagnosis present

## 2013-02-26 DIAGNOSIS — A0472 Enterocolitis due to Clostridium difficile, not specified as recurrent: Secondary | ICD-10-CM

## 2013-02-26 DIAGNOSIS — K5792 Diverticulitis of intestine, part unspecified, without perforation or abscess without bleeding: Secondary | ICD-10-CM

## 2013-02-26 DIAGNOSIS — D72829 Elevated white blood cell count, unspecified: Secondary | ICD-10-CM | POA: Diagnosis present

## 2013-02-26 DIAGNOSIS — J45909 Unspecified asthma, uncomplicated: Secondary | ICD-10-CM | POA: Diagnosis present

## 2013-02-26 DIAGNOSIS — K5790 Diverticulosis of intestine, part unspecified, without perforation or abscess without bleeding: Secondary | ICD-10-CM

## 2013-02-26 HISTORY — DX: Diverticulitis of intestine, part unspecified, without perforation or abscess without bleeding: K57.92

## 2013-02-26 HISTORY — DX: Unspecified asthma, uncomplicated: J45.909

## 2013-02-26 LAB — COMPREHENSIVE METABOLIC PANEL
Alkaline Phosphatase: 60 U/L (ref 39–117)
BUN: 5 mg/dL — ABNORMAL LOW (ref 6–23)
GFR calc Af Amer: >90 mL/min (ref >90)
Glucose, Bld: 105 mg/dL — ABNORMAL HIGH (ref 70–99)
Potassium: 4.1 mEq/L (ref 3.5–5.1)
Total Protein: 7.9 g/dL (ref 6.0–8.3)

## 2013-02-26 LAB — LACTIC ACID, PLASMA: Lactic Acid, Venous: 1.6 mmol/L (ref 0.5–2.2)

## 2013-02-26 LAB — CBC WITH DIFFERENTIAL/PLATELET
Eosinophils Absolute: 0.2 10*3/uL (ref 0.0–0.7)
Eosinophils Relative: 1 % (ref 0–5)
HCT: 45 % (ref 39.0–52.0)
Hemoglobin: 16.3 g/dL (ref 13.0–17.0)
Lymphs Abs: 2.5 10*3/uL (ref 0.7–4.0)
MCH: 33.2 pg (ref 26.0–34.0)
MCV: 91.6 fL (ref 78.0–100.0)
Monocytes Relative: 6 % (ref 3–12)
Neutrophils Relative %: 76 % (ref 43–77)
RBC: 4.91 MIL/uL (ref 4.22–5.81)

## 2013-02-26 LAB — GLUCOSE, CAPILLARY: Glucose-Capillary: 83 mg/dL (ref 70–99)

## 2013-02-26 MED ORDER — IOHEXOL 300 MG/ML  SOLN
100.0000 mL | Freq: Once | INTRAMUSCULAR | Status: AC | PRN
  Administered 2013-02-26: 100 mL via INTRAVENOUS

## 2013-02-26 MED ORDER — PNEUMOCOCCAL VAC POLYVALENT 25 MCG/0.5ML IJ INJ
0.5000 mL | INJECTION | INTRAMUSCULAR | Status: AC
  Administered 2013-02-27: 0.5 mL via INTRAMUSCULAR
  Filled 2013-02-26: qty 0.5

## 2013-02-26 MED ORDER — PIPERACILLIN-TAZOBACTAM 3.375 G IVPB
3.3750 g | Freq: Three times a day (TID) | INTRAVENOUS | Status: DC
  Administered 2013-02-27 – 2013-03-01 (×7): 3.375 g via INTRAVENOUS
  Filled 2013-02-26 (×10): qty 50

## 2013-02-26 MED ORDER — ACETAMINOPHEN 325 MG PO TABS: 650.0000 mg | ORAL_TABLET | Freq: Four times a day (QID) | ORAL | Status: DC | PRN

## 2013-02-26 MED ORDER — ALLOPURINOL 300 MG PO TABS
300.0000 mg | ORAL_TABLET | Freq: Every day | ORAL | Status: DC
  Administered 2013-02-26 – 2013-02-28 (×3): 300 mg via ORAL
  Filled 2013-02-26 (×4): qty 1

## 2013-02-26 MED ORDER — ONDANSETRON HCL 4 MG/2ML IJ SOLN
4.0000 mg | Freq: Once | INTRAMUSCULAR | Status: AC
  Administered 2013-02-26: 4 mg via INTRAVENOUS
  Filled 2013-02-26: qty 2

## 2013-02-26 MED ORDER — SODIUM CHLORIDE 0.9 % IV SOLN
INTRAVENOUS | Status: AC
  Administered 2013-02-26 – 2013-02-27 (×4): via INTRAVENOUS

## 2013-02-26 MED ORDER — SODIUM CHLORIDE 0.9 % IV BOLUS (SEPSIS)
1000.0000 mL | Freq: Once | INTRAVENOUS | Status: AC
  Administered 2013-02-26: 1000 mL via INTRAVENOUS

## 2013-02-26 MED ORDER — HYDROMORPHONE HCL PF 1 MG/ML IJ SOLN
1.0000 mg | Freq: Once | INTRAMUSCULAR | Status: AC
  Administered 2013-02-26: 1 mg via INTRAVENOUS
  Filled 2013-02-26: qty 1

## 2013-02-26 MED ORDER — IOHEXOL 300 MG/ML  SOLN
25.0000 mL | INTRAMUSCULAR | Status: AC
  Administered 2013-02-26: 25 mL via ORAL

## 2013-02-26 MED ORDER — PIPERACILLIN-TAZOBACTAM 3.375 G IVPB 30 MIN
3.3750 g | Freq: Once | INTRAVENOUS | Status: AC
  Administered 2013-02-26: 3.375 g via INTRAVENOUS
  Filled 2013-02-26: qty 50

## 2013-02-26 MED ORDER — HYDRALAZINE HCL 20 MG/ML IJ SOLN: 10.0000 mg | INTRAMUSCULAR | Status: DC | PRN

## 2013-02-26 MED ORDER — ONDANSETRON HCL 4 MG/2ML IJ SOLN
4.0000 mg | Freq: Four times a day (QID) | INTRAMUSCULAR | Status: DC | PRN
  Administered 2013-02-28 – 2013-03-01 (×2): 4 mg via INTRAVENOUS
  Filled 2013-02-26 (×2): qty 2

## 2013-02-26 MED ORDER — ACETAMINOPHEN 650 MG RE SUPP: 650.0000 mg | Freq: Four times a day (QID) | RECTAL | Status: DC | PRN

## 2013-02-26 MED ORDER — ALBUTEROL SULFATE HFA 108 (90 BASE) MCG/ACT IN AERS
2.0000 | INHALATION_SPRAY | Freq: Four times a day (QID) | RESPIRATORY_TRACT | Status: DC | PRN
  Administered 2013-02-27: 2 via RESPIRATORY_TRACT
  Filled 2013-02-26: qty 6.7

## 2013-02-26 MED ORDER — HYDROMORPHONE HCL PF 1 MG/ML IJ SOLN
1.0000 mg | INTRAMUSCULAR | Status: DC | PRN
  Administered 2013-02-26 – 2013-02-27 (×4): 1 mg via INTRAVENOUS
  Filled 2013-02-26 (×3): qty 1

## 2013-02-26 MED ORDER — ONDANSETRON HCL 4 MG PO TABS: 4.0000 mg | ORAL_TABLET | Freq: Four times a day (QID) | ORAL | Status: DC | PRN

## 2013-02-26 NOTE — ED Notes (Signed)
Pt reports that he was sent here by PCP for further work-up. Reports that he was dx with diverticulitis and has been on antibiotics but is not seeming to help. Pt reports nausea but no vomiting. States he was sent here for possible CT.

## 2013-02-26 NOTE — H&P (Signed)
[facility] History and Physical  Preston [name] WUJ:[id number] DOB: 20-Mar-1971 DOA: 02/26/2013  Referring physician: ER physician. PCP: Standley Dakins, MD  Specialists: Dr. Dulce Sellar. Gastroenterologist.  Chief Complaint: Abdominal pain.  HPI: Preston [name] is a 42 y.o. male was recently admitted for sigmoid diverticulitis with abscess had been on antibiotics since then. Patient has been having persistent abdominal pain mostly in the left lower quadrant which happens episodically with nausea vomiting and sometimes diaphoresis. Patient also gets diarrhea occasionally. Denies any fever chills. Since patient has been having constant and recurrent pain he had gone to his gastroenterologist to referring to the ER. CT abdomen pelvis shows features consistent with sigmoid diverticulitis and patient has been admitted for further management. Patient denies any chest pain or shortness of breath.  Review of Systems: As presented in the history of presenting illness, rest negative.  Past Medical History  Diagnosis Date  . Gout   . Hypertension   . Diverticulitis   . Asthma    Past Surgical History  Procedure Laterality Date  . Appendectomy    . Mouth surgery  2010   Social History:  reports that he has been smoking Cigarettes.  He has a 29 pack-year smoking history. He does not have any smokeless tobacco history on file. He reports that he drinks about 3.6 ounces of alcohol per week. He reports that he does not use illicit drugs. Home. where does patient live-- Can do ADLs. Can patient participate in ADLs?  Allergies  Allergen Reactions  . Morphine And Related Other (See Comments)    "bad feeling"    Family History  Problem Relation Age of Onset  . Cancer Father       Prior to Admission medications   Medication Sig Start Date End Date Taking? Authorizing Provider  albuterol (PROVENTIL HFA;VENTOLIN HFA) 108 (90 BASE) MCG/ACT inhaler Inhale 2 puffs into the lungs every 6 (six)  hours as needed for wheezing. 01/07/13  Yes Shanker Levora Dredge, MD  allopurinol (ZYLOPRIM) 300 MG tablet Take 300 mg by mouth at bedtime. 01/07/13  Yes Shanker Levora Dredge, MD  amoxicillin-clavulanate (AUGMENTIN) 875-125 MG per tablet Take 1 tablet by mouth every 12 (twelve) hours.   Yes Historical Provider, MD  Aspirin-Acetaminophen-Caffeine (GOODY HEADACHE PO) Take 1 packet by mouth as needed (pain).    Yes Historical Provider, MD  hydrochlorothiazide (MICROZIDE) 12.5 MG capsule Take 12.5 mg by mouth daily.   Yes Historical Provider, MD  HYDROcodone-acetaminophen (NORCO/VICODIN) 5-325 MG per tablet Take 1 tablet by mouth every 6 (six) hours as needed for pain.   Yes Historical Provider, MD  ibuprofen (ADVIL,MOTRIN) 200 MG tablet Take 800 mg by mouth every 6 (six) hours as needed for pain.   Yes Historical Provider, MD  indomethacin (INDOCIN) 25 MG capsule Take 25 mg by mouth at bedtime. For gout flare up 01/07/13  Yes Shanker Levora Dredge, MD   Physical Exam: Filed Vitals:   02/26/13 1706 02/26/13 1902 02/26/13 2117  BP: 166/108 147/99 140/85  Pulse: 104 91 85  Temp: 98.7 F (37.1 C) 98.9 F (37.2 C) 98.9 F (37.2 C)  TempSrc: Oral Oral Oral  Resp: 14 16 14   SpO2: 100% 97% 98%     General:  Well-developed and nourished.  Eyes: Anicteric no pallor.  ENT: No discharge from ears eyes nose mouth.  Neck: No mass felt.  Cardiovascular: S1-S2 heard.  Respiratory: No rhonchi or crepitations.  Abdomen: Mild tenderness in the left lower quadrant. No guarding or rigidity.  Skin: No rash.  Musculoskeletal: No edema.  Psychiatric: Appears normal.  Neurologic: Alert awake oriented to time place and person. Moves all extremities.  Labs on Admission:  Basic Metabolic Panel:  Recent Labs Lab 02/26/13 1711  NA 137  K 4.1  CL 96  CO2 27  GLUCOSE 105*  BUN 5*  CREATININE 0.71  CALCIUM 10.2   Liver Function Tests:  Recent Labs Lab 02/26/13 1711  AST 20  ALT 36  ALKPHOS 60   BILITOT 0.2*  PROT 7.9  ALBUMIN 4.4   No results found for this basename: LIPASE, AMYLASE,  in the last 168 hours No results found for this basename: AMMONIA,  in the last 168 hours CBC:  Recent Labs Lab 02/26/13 1711  WBC 14.3*  NEUTROABS 10.9*  HGB 16.3  HCT 45.0  MCV 91.6  PLT 274   Cardiac Enzymes: No results found for this basename: CKTOTAL, CKMB, CKMBINDEX, TROPONINI,  in the last 168 hours  BNP (last 3 results) No results found for this basename: PROBNP,  in the last 8760 hours CBG: No results found for this basename: GLUCAP,  in the last 168 hours  Radiological Exams on Admission: Ct Abdomen Pelvis W Contrast  02/26/2013   *RADIOLOGY REPORT*  Clinical Data: Lower abdominal pain and distention.  Diarrhea.  CT ABDOMEN AND PELVIS WITH CONTRAST  Technique:  Multidetector CT imaging of the abdomen and pelvis was performed following the standard protocol during bolus administration of intravenous contrast.  Contrast: OMNIPAQUE IOHEXOL 300 MG/ML  SOLN  Comparison: 02/03/2013.  Findings: Again demonstrated is diffuse low density of the liver relative to the spleen.  Normal appearing spleen, pancreas, gallbladder, adrenal glands, kidneys, urinary bladder and prostate gland.  Multiple sigmoid colon diverticula are again demonstrated with a long segment of diffuse sigmoid colon wall thickening with increased pericolonic soft tissue stranding.  No significant change in the small adjacent reactive lymph nodes.  A lymph node anterior to the proximal iliac arteries remains mildly enlarged with a short axis diameter of 11 mm on image number 57. Mildly prominent retroperitoneal lymph nodes are unchanged.  No evidence of appendicitis.  Clear lung bases.  Previously noted bilateral hip degenerative changes.  Lumbar and lower thoracic spine degenerative changes.  IMPRESSION:  1.  Sigmoid diverticulosis with mildly progressive changes of diverticulitis without abscess. 2.  Stable probable mild  reactive adenopathy. 3.  Stable mild hepatic steatosis.   Original Report Authenticated By: Beckie Salts, M.D.     Assessment/Plan Principal Problem:   Diverticulitis Active Problems:   HTN (hypertension)   1. Sigmoid diverticulitis - persistent versus recurrent - patient has been kept n.p.o. and on IV Zosyn. Consult GI in a.m. for further recommendations. Pain relief medication and gentle hydration. 2. Hypertension - patient is n.p.o. so has been placed on when necessary IV hydralazine. 3. History of gout.    Code Status: Full code.  Family Communication: Patient wife at the bedside.  Disposition Plan: Admit to inpatient.    Yamira Papa N. [facility] Pager [phone].  If 7PM-7AM, please contact night-coverage [email or web address] Password [facility] 02/26/2013, 9:52 PM

## 2013-02-26 NOTE — ED Provider Notes (Signed)
CSN: [id number]     Arrival date & time 02/26/13  1702 History     First MD Initiated Contact with Patient 02/26/13 1822     Chief Complaint  Patient presents with  . Abdominal Pain   (Consider location/radiation/quality/duration/timing/severity/associated sxs/prior Treatment) Patient is a 42 y.o. male presenting with abdominal pain. The history is provided by the patient and the spouse.  Abdominal Pain This is a chronic problem. Episode onset: ongoing for months. The problem occurs constantly. The problem has been unchanged. Associated symptoms include abdominal pain and nausea. Pertinent negatives include no chest pain, congestion, coughing, fatigue, fever, headaches, neck pain, numbness, rash, sore throat, vomiting or weakness. Exacerbated by: riding in car, movement, palpation. Treatments tried: pain medicine and abx. The treatment provided no relief.    Past Medical History  Diagnosis Date  . Gout   . Hypertension   . Diverticulitis   . Asthma    Past Surgical History  Procedure Laterality Date  . Appendectomy    . Mouth surgery  2010   Family History  Problem Relation Age of Onset  . Cancer Father    History  Substance Use Topics  . Smoking status: Current Every Day Smoker -- 1.00 packs/day for 29 years    Types: Cigarettes  . Smokeless tobacco: Not on file  . Alcohol Use: 3.6 oz/week    6 Cans of beer per week    Review of Systems  Constitutional: Negative for fever, activity change, appetite change and fatigue.  HENT: Negative for congestion, sore throat, facial swelling, rhinorrhea, trouble swallowing, neck pain, neck stiffness, voice change and sinus pressure.   Eyes: Negative.   Respiratory: Negative for cough, choking, chest tightness, shortness of breath and wheezing.   Cardiovascular: Negative for chest pain.  Gastrointestinal: Positive for nausea and abdominal pain. Negative for vomiting.  Genitourinary: Negative for dysuria, urgency, frequency,  hematuria, flank pain and difficulty urinating.  Musculoskeletal: Negative for back pain and gait problem.  Skin: Negative for rash and wound.  Neurological: Negative for facial asymmetry, weakness, numbness and headaches.  Psychiatric/Behavioral: Negative for behavioral problems, confusion and agitation. The patient is not nervous/anxious and is not hyperactive.   All other systems reviewed and are negative.    Allergies  Morphine and related  Home Medications   Current Outpatient Rx  Name  Route  Sig  Dispense  Refill  . albuterol (PROVENTIL HFA;VENTOLIN HFA) 108 (90 BASE) MCG/ACT inhaler   Inhalation   Inhale 2 puffs into the lungs every 6 (six) hours as needed for wheezing.         [name] allopurinol (ZYLOPRIM) 300 MG tablet   Oral   Take 300 mg by mouth at bedtime.         [name] amoxicillin-clavulanate (AUGMENTIN) 875-125 MG per tablet   Oral   Take 1 tablet by mouth every 12 (twelve) hours.         . Aspirin-Acetaminophen-Caffeine (GOODY HEADACHE PO)   Oral   Take 1 packet by mouth as needed (pain).          . hydrochlorothiazide (MICROZIDE) 12.5 MG capsule   Oral   Take 12.5 mg by mouth daily.         [name] HYDROcodone-acetaminophen (NORCO/VICODIN) 5-325 MG per tablet   Oral   Take 1 tablet by mouth every 6 (six) hours as needed for pain.         [name] ibuprofen (ADVIL,MOTRIN) 200 MG tablet   Oral   Take 800 mg  by mouth every 6 (six) hours as needed for pain.         . indomethacin (INDOCIN) 25 MG capsule   Oral   Take 25 mg by mouth at bedtime. For gout flare up          BP 147/99  Pulse 91  Temp(Src) 98.9 F (37.2 C) (Oral)  Resp 16  SpO2 97% Physical Exam  Nursing note and vitals reviewed. Constitutional: He is oriented to person, place, and time. He appears well-developed and well-nourished. No distress.  HENT:  Head: Normocephalic and atraumatic.  Right Ear: External ear normal.  Left Ear: External ear normal.  Mouth/Throat: No oropharyngeal  exudate.  Eyes: Conjunctivae and EOM are normal. Pupils are equal, round, and reactive to light. Right eye exhibits no discharge. Left eye exhibits no discharge.  Neck: Normal range of motion. Neck supple. No JVD present. No tracheal deviation present. No thyromegaly present.  Cardiovascular: Regular rhythm, normal heart sounds and intact distal pulses.  Tachycardia present.  Exam reveals no gallop and no friction rub.   No murmur heard. Pulmonary/Chest: Effort normal and breath sounds normal. No respiratory distress. He has no wheezes. He exhibits no tenderness.  Abdominal: Soft. Bowel sounds are normal. He exhibits no distension. There is tenderness in the right lower quadrant, suprapubic area and left lower quadrant. There is no rigidity, no rebound, no guarding and negative Murphy's sign.  Musculoskeletal: Normal range of motion. He exhibits no edema and no tenderness.  Lymphadenopathy:    He has no cervical adenopathy.  Neurological: He is alert and oriented to person, place, and time. No cranial nerve deficit.  Skin: Skin is warm and dry. No rash noted. He is not diaphoretic. No pallor.  Psychiatric: He has a normal mood and affect. His behavior is normal.    ED Course   Procedures (including critical care time)  Labs Reviewed  CBC WITH DIFFERENTIAL - Abnormal; Notable for the following:    WBC 14.3 (*)    MCHC 36.2 (*)    Neutro Abs 10.9 (*)    All other components within normal limits  COMPREHENSIVE METABOLIC PANEL - Abnormal; Notable for the following:    Glucose, Bld 105 (*)    BUN 5 (*)    Total Bilirubin 0.2 (*)    All other components within normal limits   Ct Abdomen Pelvis W Contrast  02/26/2013   *RADIOLOGY REPORT*  Clinical Data: Lower abdominal pain and distention.  Diarrhea.  CT ABDOMEN AND PELVIS WITH CONTRAST  Technique:  Multidetector CT imaging of the abdomen and pelvis was performed following the standard protocol during bolus administration of intravenous  contrast.  Contrast: OMNIPAQUE IOHEXOL 300 MG/ML  SOLN  Comparison: 02/03/2013.  Findings: Again demonstrated is diffuse low density of the liver relative to the spleen.  Normal appearing spleen, pancreas, gallbladder, adrenal glands, kidneys, urinary bladder and prostate gland.  Multiple sigmoid colon diverticula are again demonstrated with a long segment of diffuse sigmoid colon wall thickening with increased pericolonic soft tissue stranding.  No significant change in the small adjacent reactive lymph nodes.  A lymph node anterior to the proximal iliac arteries remains mildly enlarged with a short axis diameter of 11 mm on image number 57. Mildly prominent retroperitoneal lymph nodes are unchanged.  No evidence of appendicitis.  Clear lung bases.  Previously noted bilateral hip degenerative changes.  Lumbar and lower thoracic spine degenerative changes.  IMPRESSION:  1.  Sigmoid diverticulosis with mildly progressive changes of  diverticulitis without abscess. 2.  Stable probable mild reactive adenopathy. 3.  Stable mild hepatic steatosis.   Original Report Authenticated By: Beckie Salts, M.D.   No diagnosis found.  MDM  42 yr old male patient w/ hx of ongoing abdominal pain related to diverticulitis. Patient found to have perforated colon and abscess in April 2014. Was treated medically. Has been on intermittent bouts of ABX's ever since. Says pain has not much improved. Pain is in lower abdomen. Denies blood in stool and tenesmus. Pain on exam. Leukocytosis. Imaging shows continued diverticulitis which is likely explaining his pain. Will start IV abx and admit to hospital as patient is failing outpatient therapy. Pain controlled in ED. Patient slightly tachycardic and hypertensive.  Results for orders placed during the hospital encounter of 02/26/13  CBC WITH DIFFERENTIAL      Result Value Range   WBC 14.3 (*) 4.0 - 10.5 K/uL   RBC 4.91  4.22 - 5.81 MIL/uL   Hemoglobin 16.3  13.0 - 17.0 g/dL    HCT 16.1  09.6 - 04.5 %   MCV 91.6  78.0 - 100.0 fL   MCH 33.2  26.0 - 34.0 pg   MCHC 36.2 (*) 30.0 - 36.0 g/dL   RDW 40.9  81.1 - 91.4 %   Platelets 274  150 - 400 K/uL   Neutrophils Relative % 76  43 - 77 %   Neutro Abs 10.9 (*) 1.7 - 7.7 K/uL   Lymphocytes Relative 17  12 - 46 %   Lymphs Abs 2.5  0.7 - 4.0 K/uL   Monocytes Relative 6  3 - 12 %   Monocytes Absolute 0.8  0.1 - 1.0 K/uL   Eosinophils Relative 1  0 - 5 %   Eosinophils Absolute 0.2  0.0 - 0.7 K/uL   Basophils Relative 0  0 - 1 %   Basophils Absolute 0.0  0.0 - 0.1 K/uL  COMPREHENSIVE METABOLIC PANEL      Result Value Range   Sodium 137  135 - 145 mEq/L   Potassium 4.1  3.5 - 5.1 mEq/L   Chloride 96  96 - 112 mEq/L   CO2 27  19 - 32 mEq/L   Glucose, Bld 105 (*) 70 - 99 mg/dL   BUN 5 (*) 6 - 23 mg/dL   Creatinine, Ser 7.82  0.50 - 1.35 mg/dL   Calcium 95.6  8.4 - 21.3 mg/dL   Total Protein 7.9  6.0 - 8.3 g/dL   Albumin 4.4  3.5 - 5.2 g/dL   AST 20  0 - 37 U/L   ALT 36  0 - 53 U/L   Alkaline Phosphatase 60  39 - 117 U/L   Total Bilirubin 0.2 (*) 0.3 - 1.2 mg/dL   GFR calc non Af Amer >90  >90 mL/min   GFR calc Af Amer >90  >90 mL/min    CT Abdomen Pelvis W Contrast (Final result)  Result time: 02/26/13 20:16:32    Final result by Rad Results In Interface (02/26/13 20:16:32)    Narrative:   *RADIOLOGY REPORT*  Clinical Data: Lower abdominal pain and distention. Diarrhea.  CT ABDOMEN AND PELVIS WITH CONTRAST  Technique: Multidetector CT imaging of the abdomen and pelvis was performed following the standard protocol during bolus administration of intravenous contrast.  Contrast: OMNIPAQUE IOHEXOL 300 MG/ML SOLN  Comparison: 02/03/2013.  Findings: Again demonstrated is diffuse low density of the liver relative to the spleen. Normal appearing spleen, pancreas, gallbladder, adrenal glands,  kidneys, urinary bladder and prostate gland.  Multiple sigmoid colon diverticula are again demonstrated with  a long segment of diffuse sigmoid colon wall thickening with increased pericolonic soft tissue stranding. No significant change in the small adjacent reactive lymph nodes. A lymph node anterior to the proximal iliac arteries remains mildly enlarged with a short axis diameter of 11 mm on image number 57. Mildly prominent retroperitoneal lymph nodes are unchanged.  No evidence of appendicitis. Clear lung bases. Previously noted bilateral hip degenerative changes. Lumbar and lower thoracic spine degenerative changes.  IMPRESSION:  1. Sigmoid diverticulosis with mildly progressive changes of diverticulitis without abscess. 2. Stable probable mild reactive adenopathy. 3. Stable mild hepatic steatosis.   Original Report Authenticated By: Beckie Salts, M.D     Case discussed with Dr. Ethelda Chick.  Sherryl Manges, MD 02/26/13 2033

## 2013-02-26 NOTE — ED Provider Notes (Signed)
  CSN: [id number]     Arrival date & time 02/26/13  1702 History     None    Chief Complaint  Patient presents with  . Abdominal Pain   (Consider location/radiation/quality/duration/timing/severity/associated sxs/prior Treatment) HPI  Past Medical History  Diagnosis Date  . Gout   . Hypertension   . Diverticulitis    Past Surgical History  Procedure Laterality Date  . Appendectomy    . Mouth surgery  2010   Family History  Problem Relation Age of Onset  . Cancer Father    History  Substance Use Topics  . Smoking status: Current Every Day Smoker -- 1.00 packs/day for 29 years    Types: Cigarettes  . Smokeless tobacco: Not on file  . Alcohol Use: 3.6 oz/week    6 Cans of beer per week    Review of Systems  Allergies  Morphine and related  Home Medications   Current Outpatient Rx  Name  Route  Sig  Dispense  Refill  . albuterol (PROVENTIL HFA;VENTOLIN HFA) 108 (90 BASE) MCG/ACT inhaler   Inhalation   Inhale 2 puffs into the lungs every 6 (six) hours as needed for wheezing.         [name] allopurinol (ZYLOPRIM) 300 MG tablet   Oral   Take 300 mg by mouth at bedtime.         [name] amoxicillin-clavulanate (AUGMENTIN) 875-125 MG per tablet   Oral   Take 1 tablet by mouth every 12 (twelve) hours.         . Aspirin-Acetaminophen-Caffeine (GOODY HEADACHE PO)   Oral   Take 1 packet by mouth as needed (pain).          . hydrochlorothiazide (MICROZIDE) 12.5 MG capsule   Oral   Take 12.5 mg by mouth daily.         [name] HYDROcodone-acetaminophen (NORCO/VICODIN) 5-325 MG per tablet   Oral   Take 1 tablet by mouth every 6 (six) hours as needed for pain.         [name] ibuprofen (ADVIL,MOTRIN) 200 MG tablet   Oral   Take 800 mg by mouth every 6 (six) hours as needed for pain.         . indomethacin (INDOCIN) 25 MG capsule   Oral   Take 25 mg by mouth at bedtime. For gout flare up          BP 166/108  Pulse 104  Temp(Src) 98.7 F (37.1 C) (Oral)  Resp 14   SpO2 100% Physical Exam  ED Course   Procedures (including critical care time)  Labs Reviewed  CBC WITH DIFFERENTIAL - Abnormal; Notable for the following:    WBC 14.3 (*)    MCHC 36.2 (*)    Neutro Abs 10.9 (*)    All other components within normal limits  COMPREHENSIVE METABOLIC PANEL - Abnormal; Notable for the following:    Glucose, Bld 105 (*)    BUN 5 (*)    Total Bilirubin 0.2 (*)    All other components within normal limits   No results found. No diagnosis found.  MDM  Duplicate note please delete  Doug Sou, MD 02/27/13 7829

## 2013-02-26 NOTE — ED Provider Notes (Signed)
Complains of constant abdominal pain since April 2014. Has received Multiple rounds of antibiotics for diverticulitis. Presents today with continued pain.  Doug Sou, MD 02/27/13 1610

## 2013-02-26 NOTE — Progress Notes (Signed)
Patient admitted to 5w14. Patient lives at home with wife. Patient is A&Ox3. Placed pt on contact precautions to r/o c-diff. Oriented patient to unit and room. Patient's skin is WNL.  Will continue to monitor patient. Nelda Marseille, RN

## 2013-02-27 DIAGNOSIS — A0472 Enterocolitis due to Clostridium difficile, not specified as recurrent: Secondary | ICD-10-CM

## 2013-02-27 DIAGNOSIS — K5732 Diverticulitis of large intestine without perforation or abscess without bleeding: Secondary | ICD-10-CM

## 2013-02-27 LAB — CBC WITH DIFFERENTIAL/PLATELET
Eosinophils Absolute: 0.3 10*3/uL (ref 0.0–0.7)
Lymphs Abs: 2.4 10*3/uL (ref 0.7–4.0)
MCH: 31.9 pg (ref 26.0–34.0)
Neutro Abs: 7.1 10*3/uL (ref 1.7–7.7)
Neutrophils Relative %: 67 % (ref 43–77)
Platelets: 244 10*3/uL (ref 150–400)
RBC: 4.39 MIL/uL (ref 4.22–5.81)
WBC: 10.5 10*3/uL (ref 4.0–10.5)

## 2013-02-27 LAB — GLUCOSE, CAPILLARY
Glucose-Capillary: 100 mg/dL — ABNORMAL HIGH (ref 70–99)
Glucose-Capillary: 95 mg/dL (ref 70–99)
Glucose-Capillary: 114 mg/dL — ABNORMAL HIGH (ref 70–99)

## 2013-02-27 LAB — COMPREHENSIVE METABOLIC PANEL
AST: 18 U/L (ref 0–37)
BUN: 5 mg/dL — ABNORMAL LOW (ref 6–23)
CO2: 26 mEq/L (ref 19–32)
Chloride: 102 mEq/L (ref 96–112)
Creatinine, Ser: 0.72 mg/dL (ref 0.50–1.35)
GFR calc Af Amer: >90 mL/min (ref >90)
GFR calc non Af Amer: >90 mL/min (ref >90)
Glucose, Bld: 96 mg/dL (ref 70–99)
Total Bilirubin: 0.3 mg/dL (ref 0.3–1.2)

## 2013-02-27 MED ORDER — HYDROMORPHONE HCL PF 1 MG/ML IJ SOLN
1.0000 mg | INTRAMUSCULAR | Status: DC | PRN
  Filled 2013-02-27: qty 1

## 2013-02-27 MED ORDER — HYDROMORPHONE HCL PF 1 MG/ML IJ SOLN
1.0000 mg | INTRAMUSCULAR | Status: DC | PRN
  Administered 2013-02-27 – 2013-03-01 (×12): 1 mg via INTRAVENOUS
  Filled 2013-02-27 (×12): qty 1

## 2013-02-27 MED ORDER — SACCHAROMYCES BOULARDII 250 MG PO CAPS
250.0000 mg | ORAL_CAPSULE | Freq: Two times a day (BID) | ORAL | Status: DC
  Administered 2013-02-27 – 2013-03-01 (×4): 250 mg via ORAL
  Filled 2013-02-27 (×7): qty 1

## 2013-02-27 MED ORDER — METRONIDAZOLE 500 MG PO TABS
500.0000 mg | ORAL_TABLET | Freq: Three times a day (TID) | ORAL | Status: DC
  Administered 2013-02-27 – 2013-03-01 (×7): 500 mg via ORAL
  Filled 2013-02-27 (×9): qty 1

## 2013-02-27 MED ORDER — DIAZEPAM 5 MG PO TABS
5.0000 mg | ORAL_TABLET | Freq: Once | ORAL | Status: AC
  Administered 2013-02-27: 5 mg via ORAL
  Filled 2013-02-27: qty 1

## 2013-02-27 NOTE — ED Provider Notes (Signed)
I have personally seen and examined the patient.  I have discussed the plan of care with the resident.  I have reviewed the documentation on PMH/FH/Soc. History.  I have reviewed the documentation of the resident and agree.  Doug Sou, MD 02/27/13 1610

## 2013-02-27 NOTE — Care Management Note (Signed)
    Page 1 of 1   03/02/2013     8:38:24 AM   CARE MANAGEMENT NOTE 03/02/2013  Patient:  Preston [name], Preston [name]   Account Number:  [id number]  Date Initiated:  02/27/2013  Documentation initiated by:  Letha Cape  Subjective/Objective Assessment:   dx diverticulitis  admit- lives with wife.     Action/Plan:   Anticipated DC Date:  03/02/2013   Anticipated DC Plan:  HOME/SELF CARE         Choice offered to / List presented to:             Status of service:  Completed, signed off Medicare Important Message given?   (If response is "NO", the following Medicare IM given date fields will be blank) Date Medicare IM given:   Date Additional Medicare IM given:    Discharge Disposition:  HOME/SELF CARE  Per UR Regulation:  Reviewed for med. necessity/level of care/duration of stay  If discussed at Long Length of Stay Meetings, dates discussed:    Comments:  03/02/13 8:37 Letha Cape RN, BSN [phone] patient dc to home.  02/27/13 15:53 Letha Cape RN, BSN [phone] patient lives with wife, NCM will continue to follow for dc needs.

## 2013-02-27 NOTE — Consult Note (Signed)
Reason for Consult: recurrent diverticulitis  Referring Physician: Dr. Jerral [name]  HPI: Preston [name] is a 42 year old male with a history of tobacco abuse, hypertension, asthma and diverticulitis.  He has been hospitalized with diverticulitis in April and June 3-5th.  He has been on antibiotics intermittently since April.  He has been followed by Dr. Derrell [name] and Dr. Dulce [name].  Unfortunately, he has not had a colonoscopy due to the recurrent episodes.  He presented to [facility] yesterday with worsening abdominal pain.  Preston [name] states that over the past 4 days the pain became worse.  Since April, he states he has always had pain which is "liveable."  Associated with diarrhea, but in recent days he has been having 7 BMs per day.  Denies melena or hematochezia, but noticed mucus.  He also has nausea, sweats, bloating and headache.  He had 2 episodes of emesis earlier in the week.  He denies fever or chills.  He denies aggravating or alleviating factors.  He has changed his diet and reduced tobacco use from 2.5ppd to 1-1.5ppd now.  He drinks 3-4 beers "most days."  He denies family history of colon cancer, crohn's celiac or diverticulitis.  His pain is around the umbilicus and radiates laterally.  It is a constant ache with intermittent sharp pain.  He has a past surgical history of appendectomy.  He had a negative c. Diff PCR in June of 2014.  He is currently afebrile and without tachycardia.  His white count is down to 10.5K from 14K.    Past Medical History  Diagnosis Date  . Gout   . Hypertension   . Diverticulitis   . Asthma     Past Surgical History  Procedure Laterality Date  . Appendectomy    . Mouth surgery  2010    Family History  Problem Relation Age of Onset  . Cancer Father     Social History:  reports that he has been smoking Cigarettes.  He has a 29 pack-year smoking history. He does not have any smokeless tobacco history on file. He reports that he drinks about 3.6 ounces of alcohol per  week. He reports that he does not use illicit drugs.  Allergies:  Allergies  Allergen Reactions  . Morphine And Related Other (See Comments)    "bad feeling"    Medications: medication reviewed  Results for orders placed during the hospital encounter of 02/26/13 (from the past 48 hour(s))  CBC WITH DIFFERENTIAL     Status: Abnormal   Collection Time    02/26/13  5:11 PM      Result Value Range   WBC 14.3 (*) 4.0 - 10.5 K/uL   RBC 4.91  4.22 - 5.81 MIL/uL   Hemoglobin 16.3  13.0 - 17.0 g/dL   HCT 09.8  11.9 - 14.7 %   MCV 91.6  78.0 - 100.0 fL   MCH 33.2  26.0 - 34.0 pg   MCHC 36.2 (*) 30.0 - 36.0 g/dL   RDW 82.9  56.2 - 13.0 %   Platelets 274  150 - 400 K/uL   Neutrophils Relative % 76  43 - 77 %   Neutro Abs 10.9 (*) 1.7 - 7.7 K/uL   Lymphocytes Relative 17  12 - 46 %   Lymphs Abs 2.5  0.7 - 4.0 K/uL   Monocytes Relative 6  3 - 12 %   Monocytes Absolute 0.8  0.1 - 1.0 K/uL   Eosinophils Relative 1  0 - 5 %  Eosinophils Absolute 0.2  0.0 - 0.7 K/uL   Basophils Relative 0  0 - 1 %   Basophils Absolute 0.0  0.0 - 0.1 K/uL  COMPREHENSIVE METABOLIC PANEL     Status: Abnormal   Collection Time    02/26/13  5:11 PM      Result Value Range   Sodium 137  135 - 145 mEq/L   Potassium 4.1  3.5 - 5.1 mEq/L   Chloride 96  96 - 112 mEq/L   CO2 27  19 - 32 mEq/L   Glucose, Bld 105 (*) 70 - 99 mg/dL   BUN 5 (*) 6 - 23 mg/dL   Creatinine, Ser 1.61  0.50 - 1.35 mg/dL   Calcium 09.6  8.4 - 04.5 mg/dL   Total Protein 7.9  6.0 - 8.3 g/dL   Albumin 4.4  3.5 - 5.2 g/dL   AST 20  0 - 37 U/L   ALT 36  0 - 53 U/L   Alkaline Phosphatase 60  39 - 117 U/L   Total Bilirubin 0.2 (*) 0.3 - 1.2 mg/dL   GFR calc non Af Amer >90  >90 mL/min   GFR calc Af Amer >90  >90 mL/min   Comment:            The eGFR has been calculated     using the CKD EPI equation.     This calculation has not been     validated in all clinical     situations.     eGFR's persistently     <90 mL/min signify      possible Chronic Kidney Disease.  LACTIC ACID, PLASMA     Status: None   Collection Time    02/26/13 10:27 PM      Result Value Range   Lactic Acid, Venous 1.6  0.5 - 2.2 mmol/L  GLUCOSE, CAPILLARY     Status: None   Collection Time    02/26/13 11:55 PM      Result Value Range   Glucose-Capillary 83  70 - 99 mg/dL   Comment 1 Documented in Chart     Comment 2 Notify RN    GLUCOSE, CAPILLARY     Status: Abnormal   Collection Time    02/27/13  3:54 AM      Result Value Range   Glucose-Capillary 100 (*) 70 - 99 mg/dL  COMPREHENSIVE METABOLIC PANEL     Status: Abnormal   Collection Time    02/27/13  5:10 AM      Result Value Range   Sodium 139  135 - 145 mEq/L   Potassium 3.7  3.5 - 5.1 mEq/L   Chloride 102  96 - 112 mEq/L   CO2 26  19 - 32 mEq/L   Glucose, Bld 96  70 - 99 mg/dL   BUN 5 (*) 6 - 23 mg/dL   Creatinine, Ser 4.09  0.50 - 1.35 mg/dL   Calcium 9.4  8.4 - 81.1 mg/dL   Total Protein 6.4  6.0 - 8.3 g/dL   Albumin 3.5  3.5 - 5.2 g/dL   AST 18  0 - 37 U/L   ALT 28  0 - 53 U/L   Alkaline Phosphatase 57  39 - 117 U/L   Total Bilirubin 0.3  0.3 - 1.2 mg/dL   GFR calc non Af Amer >90  >90 mL/min   GFR calc Af Amer >90  >90 mL/min   Comment:  The eGFR has been calculated     using the CKD EPI equation.     This calculation has not been     validated in all clinical     situations.     eGFR's persistently     <90 mL/min signify     possible Chronic Kidney Disease.  CBC WITH DIFFERENTIAL     Status: None   Collection Time    02/27/13  5:10 AM      Result Value Range   WBC 10.5  4.0 - 10.5 K/uL   RBC 4.39  4.22 - 5.81 MIL/uL   Hemoglobin 14.0  13.0 - 17.0 g/dL   HCT 29.5  28.4 - 13.2 %   MCV 92.9  78.0 - 100.0 fL   MCH 31.9  26.0 - 34.0 pg   MCHC 34.3  30.0 - 36.0 g/dL   RDW 44.0  10.2 - 72.5 %   Platelets 244  150 - 400 K/uL   Neutrophils Relative % 67  43 - 77 %   Neutro Abs 7.1  1.7 - 7.7 K/uL   Lymphocytes Relative 23  12 - 46 %   Lymphs Abs 2.4   0.7 - 4.0 K/uL   Monocytes Relative 7  3 - 12 %   Monocytes Absolute 0.7  0.1 - 1.0 K/uL   Eosinophils Relative 3  0 - 5 %   Eosinophils Absolute 0.3  0.0 - 0.7 K/uL   Basophils Relative 0  0 - 1 %   Basophils Absolute 0.0  0.0 - 0.1 K/uL  CLOSTRIDIUM DIFFICILE BY PCR     Status: Abnormal   Collection Time    02/27/13  6:32 AM      Result Value Range   C difficile by pcr POSITIVE (*) NEGATIVE   Comment: CRITICAL RESULT CALLED TO, READ BACK BY AND VERIFIED WITH:     PEREZ RN 10:45 02/27/13 ([email or web address])  GLUCOSE, CAPILLARY     Status: Abnormal   Collection Time    02/27/13  8:14 AM      Result Value Range   Glucose-Capillary 113 (*) 70 - 99 mg/dL   Comment 1 Documented in Chart     Comment 2 Notify RN    GLUCOSE, CAPILLARY     Status: None   Collection Time    02/27/13 11:28 AM      Result Value Range   Glucose-Capillary 95  70 - 99 mg/dL   Comment 1 Documented in Chart     Comment 2 Notify RN      Ct Abdomen Pelvis W Contrast  02/26/2013   *RADIOLOGY REPORT*  Clinical Data: Lower abdominal pain and distention.  Diarrhea.  CT ABDOMEN AND PELVIS WITH CONTRAST  Technique:  Multidetector CT imaging of the abdomen and pelvis was performed following the standard protocol during bolus administration of intravenous contrast.  Contrast: OMNIPAQUE IOHEXOL 300 MG/ML  SOLN  Comparison: 02/03/2013.  Findings: Again demonstrated is diffuse low density of the liver relative to the spleen.  Normal appearing spleen, pancreas, gallbladder, adrenal glands, kidneys, urinary bladder and prostate gland.  Multiple sigmoid colon diverticula are again demonstrated with a long segment of diffuse sigmoid colon wall thickening with increased pericolonic soft tissue stranding.  No significant change in the small adjacent reactive lymph nodes.  A lymph node anterior to the proximal iliac arteries remains mildly enlarged with a short axis diameter of 11 mm on image number 57. Mildly prominent retroperitoneal lymph  nodes are unchanged.  No evidence of appendicitis.  Clear lung bases.  Previously noted bilateral hip degenerative changes.  Lumbar and lower thoracic spine degenerative changes.  IMPRESSION:  1.  Sigmoid diverticulosis with mildly progressive changes of diverticulitis without abscess. 2.  Stable probable mild reactive adenopathy. 3.  Stable mild hepatic steatosis.   Original Report Authenticated By: Beckie Salts, M.D.    Review of Systems  Constitutional: Negative for fever, chills, weight loss and malaise/fatigue.  Respiratory: Negative for shortness of breath and wheezing.   Cardiovascular: Negative for chest pain, palpitations and leg swelling.  Gastrointestinal: Positive for abdominal pain and diarrhea. Negative for nausea, vomiting, constipation, blood in stool and melena.  Genitourinary: Negative for dysuria and hematuria.  Neurological: Positive for headaches. Negative for dizziness, tingling, seizures, loss of consciousness and weakness.  Psychiatric/Behavioral: Negative for substance abuse. The patient is not nervous/anxious.    Blood pressure 138/95, pulse 74, temperature 97.3 F (36.3 C), temperature source Oral, resp. rate 16, height 6\' 2"  (1.88 m), weight 260 lb 5.8 oz (118.1 kg), SpO2 99.00%. Physical Exam  Constitutional: He is oriented to person, place, and time. He appears well-developed and well-nourished. No distress.  HENT:  Head: Normocephalic and atraumatic.  Neck: Normal range of motion. Neck supple. No thyromegaly present.  Cardiovascular: Normal rate, regular rhythm, normal heart sounds and intact distal pulses.  Exam reveals no gallop and no friction rub.   No murmur heard. Respiratory: Effort normal and breath sounds normal. No respiratory distress. He has no wheezes. He exhibits no tenderness.  GI: Soft. He exhibits no mass. There is no guarding.  Diffuse tenderness to palpation  Musculoskeletal: He exhibits no edema and no tenderness.  Lymphadenopathy:    He has  no cervical adenopathy.  Neurological: He is oriented to person, place, and time.  Skin: Skin is warm and dry. No rash noted. He is not diaphoretic. No erythema. No pallor.  Psychiatric: He has a normal mood and affect. His behavior is normal. Thought content normal.    Assessment/Plan: Recurrent diverticulitis: this is the patients 3rd hospitalization since April.  He reports that he has never been symptom free since his episode in April.  The worsening abdominal pain may also be from the newly diagnosed c. Diff colitis, which was negative 12/30/12. Unfortunately, we cannot offer surgical intervention at this time due to the c. Diff infection. He does not have an abscess of perforation on CT scan that would warrant immediate surgical intervention.  Given the recurrence and severity of symptoms, he will likely require surgery at some point.  I will notify Dr. Derrell [name] of the patients admission.  Agree with zosyn and flagyl.  Continue with clear liquid diet until his symptoms improve.  We will continue to follow the patient.    Ashok Norris ANP-BC Pager [phone]  02/27/2013, 2:17 PM

## 2013-02-27 NOTE — Progress Notes (Signed)
Notified Lynch, NP that patient states that he feels that he is going to have a panic attack. Patient states that he takes valium and takes inhaler at home when he feels this way. Respiratory therapist gave pt inhaler. Burnadette Peter, NP gave order for valium 5mg  pox1. Will continue to monitor patient. Nelda Marseille, RN

## 2013-02-27 NOTE — ED Provider Notes (Signed)
Complains of left lower quadrant pain typical of diverticulitis studies had since April 2014. Patient has been treated with multiple courses of antibiotics, without  improvement.  Doug Sou, MD 02/27/13 [phone]

## 2013-02-27 NOTE — Progress Notes (Signed)
Md notified patient positive for cdiff.

## 2013-02-27 NOTE — Consult Note (Signed)
[facility] Reason for consult: Recurrent Diverticulitis Referring Physician: PCP: Dr. Clanford Johnson. Surgeon: Dr. Ingram. G.I.: Dr. Outlaw  Preston [name] is an 42 y.o. male.  HPI: patient has had diverticulitis since April. He has been hospitalized and has had multiple CT scans and multiple courses of antibiotics. It appears that he had a peridiverticular abscess that appear to resolved by CT scan, however, he reports that he has continued to have left lower quadrant pain ever since April of varying degrees. He Has Seen Dr. Outlaw in the office and plans were made to do a colonoscopy at some point in the future after his pain had resolved and he was no longer on antibiotics. He notes that his pain has been getting progressively worse and in addition he has begun to have diarrhea. He was admitted with a slightly elevated WBC and was found to be positive for C. difficile. He has been negative for C. difficile in the past. CT scan this admission reveal mild diverticulitis manifested by diffuse sigmoid colon wall thickening with pericolonic stranding without clear cut pericolonic abscess.   Past Medical History  Diagnosis Date  . Gout   . Hypertension   . Diverticulitis   . Asthma     Past Surgical History  Procedure Laterality Date  . Appendectomy    . Mouth surgery  2010    Family History  Problem Relation Age of Onset  . Cancer Father     Social History:  reports that he has been smoking Cigarettes.  He has a 29 pack-year smoking history. He does not have any smokeless tobacco history on file. He reports that he drinks about 3.6 ounces of alcohol per week. He reports that he does not use illicit drugs.  Allergies:  Allergies  Allergen Reactions  . Morphine And Related Other (See Comments)    "bad feeling"    Medications; . allopurinol  300 mg Oral QHS  . metroNIDAZOLE  500 mg Oral Q8H  . piperacillin-tazobactam (ZOSYN)  IV  3.375 g Intravenous Q8H  .  saccharomyces boulardii  250 mg Oral BID   PRN Meds acetaminophen, acetaminophen, albuterol, hydrALAZINE, HYDROmorphone (DILAUDID) injection, ondansetron (ZOFRAN) IV, ondansetron Results for orders placed during the hospital encounter of 02/26/13 (from the past 48 hour(s))  CBC WITH DIFFERENTIAL     Status: Abnormal   Collection Time    02/26/13  5:11 PM      Result Value Range   WBC 14.3 (*) 4.0 - 10.5 K/uL   RBC 4.91  4.22 - 5.81 MIL/uL   Hemoglobin 16.3  13.0 - 17.0 g/dL   HCT 45.0  39.0 - 52.0 %   MCV 91.6  78.0 - 100.0 fL   MCH 33.2  26.0 - 34.0 pg   MCHC 36.2 (*) 30.0 - 36.0 g/dL   RDW 13.9  11.5 - 15.5 %   Platelets 274  150 - 400 K/uL   Neutrophils Relative % 76  43 - 77 %   Neutro Abs 10.9 (*) 1.7 - 7.7 K/uL   Lymphocytes Relative 17  12 - 46 %   Lymphs Abs 2.5  0.7 - 4.0 K/uL   Monocytes Relative 6  3 - 12 %   Monocytes Absolute 0.8  0.1 - 1.0 K/uL   Eosinophils Relative 1  0 - 5 %   Eosinophils Absolute 0.2  0.0 - 0.7 K/uL   Basophils Relative 0  0 - 1 %   Basophils Absolute 0.0  0.0 -   0.1 K/uL  COMPREHENSIVE METABOLIC PANEL     Status: Abnormal   Collection Time    02/26/13  5:11 PM      Result Value Range   Sodium 137  135 - 145 mEq/L   Potassium 4.1  3.5 - 5.1 mEq/L   Chloride 96  96 - 112 mEq/L   CO2 27  19 - 32 mEq/L   Glucose, Bld 105 (*) 70 - 99 mg/dL   BUN 5 (*) 6 - 23 mg/dL   Creatinine, Ser 0.71  0.50 - 1.35 mg/dL   Calcium 10.2  8.4 - 10.5 mg/dL   Total Protein 7.9  6.0 - 8.3 g/dL   Albumin 4.4  3.5 - 5.2 g/dL   AST 20  0 - 37 U/L   ALT 36  0 - 53 U/L   Alkaline Phosphatase 60  39 - 117 U/L   Total Bilirubin 0.2 (*) 0.3 - 1.2 mg/dL   GFR calc non Af Amer >90  >90 mL/min   GFR calc Af Amer >90  >90 mL/min   Comment:            The eGFR has been calculated     using the CKD EPI equation.     This calculation has not been     validated in all clinical     situations.     eGFR's persistently     <90 mL/min signify     possible Chronic Kidney  Disease.  LACTIC ACID, PLASMA     Status: None   Collection Time    02/26/13 10:27 PM      Result Value Range   Lactic Acid, Venous 1.6  0.5 - 2.2 mmol/L  GLUCOSE, CAPILLARY     Status: None   Collection Time    02/26/13 11:55 PM      Result Value Range   Glucose-Capillary 83  70 - 99 mg/dL   Comment 1 Documented in Chart     Comment 2 Notify RN    GLUCOSE, CAPILLARY     Status: Abnormal   Collection Time    02/27/13  3:54 AM      Result Value Range   Glucose-Capillary 100 (*) 70 - 99 mg/dL  COMPREHENSIVE METABOLIC PANEL     Status: Abnormal   Collection Time    02/27/13  5:10 AM      Result Value Range   Sodium 139  135 - 145 mEq/L   Potassium 3.7  3.5 - 5.1 mEq/L   Chloride 102  96 - 112 mEq/L   CO2 26  19 - 32 mEq/L   Glucose, Bld 96  70 - 99 mg/dL   BUN 5 (*) 6 - 23 mg/dL   Creatinine, Ser 0.72  0.50 - 1.35 mg/dL   Calcium 9.4  8.4 - 10.5 mg/dL   Total Protein 6.4  6.0 - 8.3 g/dL   Albumin 3.5  3.5 - 5.2 g/dL   AST 18  0 - 37 U/L   ALT 28  0 - 53 U/L   Alkaline Phosphatase 57  39 - 117 U/L   Total Bilirubin 0.3  0.3 - 1.2 mg/dL   GFR calc non Af Amer >90  >90 mL/min   GFR calc Af Amer >90  >90 mL/min   Comment:            The eGFR has been calculated     using the CKD EPI equation.     This calculation has   not been     validated in all clinical     situations.     eGFR's persistently     <90 mL/min signify     possible Chronic Kidney Disease.  CBC WITH DIFFERENTIAL     Status: None   Collection Time    02/27/13  5:10 AM      Result Value Range   WBC 10.5  4.0 - 10.5 K/uL   RBC 4.39  4.22 - 5.81 MIL/uL   Hemoglobin 14.0  13.0 - 17.0 g/dL   HCT 40.8  39.0 - 52.0 %   MCV 92.9  78.0 - 100.0 fL   MCH 31.9  26.0 - 34.0 pg   MCHC 34.3  30.0 - 36.0 g/dL   RDW 14.0  11.5 - 15.5 %   Platelets 244  150 - 400 K/uL   Neutrophils Relative % 67  43 - 77 %   Neutro Abs 7.1  1.7 - 7.7 K/uL   Lymphocytes Relative 23  12 - 46 %   Lymphs Abs 2.4  0.7 - 4.0 K/uL    Monocytes Relative 7  3 - 12 %   Monocytes Absolute 0.7  0.1 - 1.0 K/uL   Eosinophils Relative 3  0 - 5 %   Eosinophils Absolute 0.3  0.0 - 0.7 K/uL   Basophils Relative 0  0 - 1 %   Basophils Absolute 0.0  0.0 - 0.1 K/uL  CLOSTRIDIUM DIFFICILE BY PCR     Status: Abnormal   Collection Time    02/27/13  6:32 AM      Result Value Range   C difficile by pcr POSITIVE (*) NEGATIVE   Comment: CRITICAL RESULT CALLED TO, READ BACK BY AND VERIFIED WITH:     PEREZ RN 10:45 02/27/13 (wilsonm)  GLUCOSE, CAPILLARY     Status: Abnormal   Collection Time    02/27/13  8:14 AM      Result Value Range   Glucose-Capillary 113 (*) 70 - 99 mg/dL   Comment 1 Documented in Chart     Comment 2 Notify RN    GLUCOSE, CAPILLARY     Status: None   Collection Time    02/27/13 11:28 AM      Result Value Range   Glucose-Capillary 95  70 - 99 mg/dL   Comment 1 Documented in Chart     Comment 2 Notify RN      Ct Abdomen Pelvis W Contrast  02/26/2013   *RADIOLOGY REPORT*  Clinical Data: Lower abdominal pain and distention.  Diarrhea.  CT ABDOMEN AND PELVIS WITH CONTRAST  Technique:  Multidetector CT imaging of the abdomen and pelvis was performed following the standard protocol during bolus administration of intravenous contrast.  Contrast: 100mL OMNIPAQUE IOHEXOL 300 MG/ML  SOLN  Comparison: 02/03/2013.  Findings: Again demonstrated is diffuse low density of the liver relative to the spleen.  Normal appearing spleen, pancreas, gallbladder, adrenal glands, kidneys, urinary bladder and prostate gland.  Multiple sigmoid colon diverticula are again demonstrated with a long segment of diffuse sigmoid colon wall thickening with increased pericolonic soft tissue stranding.  No significant change in the small adjacent reactive lymph nodes.  A lymph node anterior to the proximal iliac arteries remains mildly enlarged with a short axis diameter of 11 mm on image number 57. Mildly prominent retroperitoneal lymph nodes are  unchanged.  No evidence of appendicitis.  Clear lung bases.  Previously noted bilateral hip degenerative changes.  Lumbar and lower thoracic   spine degenerative changes.  IMPRESSION:  1.  Sigmoid diverticulosis with mildly progressive changes of diverticulitis without abscess. 2.  Stable probable mild reactive adenopathy. 3.  Stable mild hepatic steatosis.   Original Report Authenticated By: Steven Reid, M.D.              Blood pressure 138/95, pulse 74, temperature 97.3 F (36.3 C), temperature source Oral, resp. rate 16, height 6' 2" (1.88 m), weight 118.1 kg (260 lb 5.8 oz), SpO2 99.00%.  Physical exam:   General-- heavy white male laying in a hospital bed watching television in no distress Heart--regular rate and rhythm without murmurs are gallops Lungs-- clear  Abdomen-- none distended with good bowel sounds and generally soft with tenderness and the left lower quadrant    Assessment: 1. Recurrent diverticulitis. The patient has had multiple episodes since April requiring multiple courses of antibiotics. Current CT scan does not clearly show abscess. It is possible that the current thickening of the sigmoid colon could all be C. difficile colitis  2.C. difficile colitis. Not unexpected based on his multiple courses of antibiotics   Plan:  will continue antibiotics for now and we will plan sigmoidoscopy tomorrow. Have discussed this with the patient.      Yakima Kreitzer JR,Bayleigh Loflin L 02/27/2013, 2:09 PM      

## 2013-02-27 NOTE — Progress Notes (Signed)
Patient states that he feels much better now after taking valium. Will continue to monitor patient. Nelda Marseille, RN

## 2013-02-27 NOTE — Progress Notes (Signed)
PATIENT DETAILS Name: Preston [name] Age: 42 y.o. Sex: male Date of Birth: 09-28-1970 Admit Date: 02/26/2013 Admitting Physician Eduard Clos, MD [id number], Keane Scrape, MD  Subjective: Admitted with left lower quadrant abdominal pain-unfortunately upon discharge in June continue to have left lower quadrant abdominal pain. Has tried numerous antibiotic regimens  Assessment/Plan: Principal Problem: Recurrent Diverticulitis - Continue Zosyn - Afebrile, leukocytosis has resolved - Left lower quadrant of the abdomen is still tender- but abdomen is soft - Given that this is persistent/recurrent diverticulitis-we'll consult GI, may need to have [facility] reevaluate the patient as well. Unfortunately given C. difficile positive, may need to delay colonoscopy  C. difficile colitis - I'm not sure whether patient abdominal pain is from diverticulitis with superimposed C. difficile colitis at this time - I will continue with Zosyn, and oral Flagyl and Florastor. Await GI input.    HTN (hypertension) - Currently moderately controlled without the use of any antihypertensive medications  Disposition: Remain inpatient  DVT Prophylaxis: Prophylactic Heparin  Code Status: Full code  Family Communication None at bedside  Procedures:  None  CONSULTS:  GI   MEDICATIONS: Scheduled Meds: . allopurinol  300 mg Oral QHS  . metroNIDAZOLE  500 mg Oral Q8H  . piperacillin-tazobactam (ZOSYN)  IV  3.375 g Intravenous Q8H  . saccharomyces boulardii  250 mg Oral BID   Continuous Infusions: . sodium chloride 125 mL/hr at 02/27/13 0623   PRN Meds:.acetaminophen, acetaminophen, albuterol, hydrALAZINE, HYDROmorphone (DILAUDID) injection, ondansetron (ZOFRAN) IV, ondansetron  Antibiotics: Anti-infectives   Start     Dose/Rate Route Frequency Ordered Stop   02/27/13 1400  metroNIDAZOLE (FLAGYL) tablet 500 mg     500 mg Oral 3 times per day 02/27/13 1209 03/13/13 1359   02/26/13 2200   piperacillin-tazobactam (ZOSYN) IVPB 3.375 g     3.375 g 12.5 mL/hr over 240 Minutes Intravenous 3 times per day 02/26/13 2156     02/26/13 2115  piperacillin-tazobactam (ZOSYN) IVPB 3.375 g     3.375 g 100 mL/hr over 30 Minutes Intravenous  Once 02/26/13 2111 02/26/13 2317       PHYSICAL EXAM: Vital signs in last 24 hours: Filed Vitals:   02/26/13 2117 02/26/13 2158 02/26/13 2252 02/27/13 0701  BP: 140/85 159/108 138/98 138/95  Pulse: 85 81 79 74  Temp: 98.9 F (37.2 C) 98.6 F (37 C)  97.3 F (36.3 C)  TempSrc: Oral Oral  Oral  Resp: 14 16  16   Height:  6\' 2"  (1.88 m)    Weight:  118.1 kg (260 lb 5.8 oz)    SpO2: 98% 98%  99%    Weight change:  Filed Weights   02/26/13 2158  Weight: 118.1 kg (260 lb 5.8 oz)   Body mass index is 33.41 kg/(m^2).   Gen Exam: Awake and alert with clear speech.   Neck: Supple, No JVD.   Chest: B/L Clear.   CVS: S1 S2 Regular, no murmurs.  Abdomen: soft, BS +,  Tender LLQ area, non distended.  Extremities: no edema, lower extremities warm to touch. Neurologic: Non Focal.   Skin: No Rash.  Wounds: N/A.    Intake/Output from previous day:  Intake/Output Summary (Last 24 hours) at 02/27/13 1217 Last data filed at 02/27/13 1022  Gross per 24 hour  Intake 1006.25 ml  Output    900 ml  Net 106.25 ml   LAB RESULTS: CBC  Recent Labs Lab 02/26/13 1711 02/27/13 0510  WBC 14.3* 10.5  HGB 16.3 14.0  HCT  45.0 40.8  PLT 274 244  MCV 91.6 92.9  MCH 33.2 31.9  MCHC 36.2* 34.3  RDW 13.9 14.0  LYMPHSABS 2.5 2.4  MONOABS 0.8 0.7  EOSABS 0.2 0.3  BASOSABS 0.0 0.0    Chemistries   Recent Labs Lab 02/26/13 1711 02/27/13 0510  NA 137 139  K 4.1 3.7  CL 96 102  CO2 27 26  GLUCOSE 105* 96  BUN 5* 5*  CREATININE 0.71 0.72  CALCIUM 10.2 9.4    CBG:  Recent Labs Lab 02/26/13 2355 02/27/13 0354 02/27/13 0814 02/27/13 1128  GLUCAP 83 100* 113* 95    GFR Estimated Creatinine Clearance: 166 ml/min (by C-G formula  based on Cr of 0.72).  Coagulation profile No results found for this basename: INR, PROTIME,  in the last 168 hours  Cardiac Enzymes No results found for this basename: CK, CKMB, TROPONINI, MYOGLOBIN,  in the last 168 hours  No components found with this basename: POCBNP,  No results found for this basename: DDIMER,  in the last 72 hours No results found for this basename: HGBA1C,  in the last 72 hours No results found for this basename: CHOL, HDL, LDLCALC, TRIG, CHOLHDL, LDLDIRECT,  in the last 72 hours No results found for this basename: TSH, T4TOTAL, FREET3, T3FREE, THYROIDAB,  in the last 72 hours No results found for this basename: VITAMINB12, FOLATE, FERRITIN, TIBC, IRON, RETICCTPCT,  in the last 72 hours No results found for this basename: LIPASE, AMYLASE,  in the last 72 hours  Urine Studies No results found for this basename: UACOL, UAPR, USPG, UPH, UTP, UGL, UKET, UBIL, UHGB, UNIT, UROB, ULEU, UEPI, UWBC, URBC, UBAC, CAST, CRYS, UCOM, BILUA,  in the last 72 hours  MICROBIOLOGY: Recent Results (from the past 240 hour(s))  CLOSTRIDIUM DIFFICILE BY PCR     Status: Abnormal   Collection Time    02/27/13  6:32 AM      Result Value Range Status   C difficile by pcr POSITIVE (*) NEGATIVE Final   Comment: CRITICAL RESULT CALLED TO, READ BACK BY AND VERIFIED WITH:     PEREZ RN 10:45 02/27/13 ([email or web address])    RADIOLOGY STUDIES/RESULTS: Ct Abdomen Pelvis W Contrast  02/26/2013   *RADIOLOGY REPORT*  Clinical Data: Lower abdominal pain and distention.  Diarrhea.  CT ABDOMEN AND PELVIS WITH CONTRAST  Technique:  Multidetector CT imaging of the abdomen and pelvis was performed following the standard protocol during bolus administration of intravenous contrast.  Contrast: OMNIPAQUE IOHEXOL 300 MG/ML  SOLN  Comparison: 02/03/2013.  Findings: Again demonstrated is diffuse low density of the liver relative to the spleen.  Normal appearing spleen, pancreas, gallbladder, adrenal glands, kidneys,  urinary bladder and prostate gland.  Multiple sigmoid colon diverticula are again demonstrated with a long segment of diffuse sigmoid colon wall thickening with increased pericolonic soft tissue stranding.  No significant change in the small adjacent reactive lymph nodes.  A lymph node anterior to the proximal iliac arteries remains mildly enlarged with a short axis diameter of 11 mm on image number 57. Mildly prominent retroperitoneal lymph nodes are unchanged.  No evidence of appendicitis.  Clear lung bases.  Previously noted bilateral hip degenerative changes.  Lumbar and lower thoracic spine degenerative changes.  IMPRESSION:  1.  Sigmoid diverticulosis with mildly progressive changes of diverticulitis without abscess. 2.  Stable probable mild reactive adenopathy. 3.  Stable mild hepatic steatosis.   Original Report Authenticated By: Beckie Salts, M.D.   Ct Abdomen Pelvis W  Contrast  02/03/2013   *RADIOLOGY REPORT*  Clinical Data: Abdominal pain.  History of diverticulitis.  CT ABDOMEN AND PELVIS WITH CONTRAST  Technique:  Multidetector CT imaging of the abdomen and pelvis was performed following the standard protocol during bolus administration of intravenous contrast.  Contrast: OMNIPAQUE IOHEXOL 300 MG/ML  SOLN  Comparison: CT abdomen and pelvis 12/29/2012, 12/17/2012, and 11/14/2012.  Findings: The lung bases are clear without focal nodule, mass, or airspace disease.  The heart size is normal.  No significant pleural or pericardial effusion is present.  Mild fatty infiltration is present throughout the liver.  The spleen is within normal limits.  No discrete lesions are present. The stomach, duodenum, and pancreas are within normal limits.  The common bile duct and gallbladder are normal.  The adrenal glands are normal bilaterally.  The kidneys and ureters are within normal limits bilaterally.  There are persistent inflammatory changes surrounding the sigmoid colon.  No discrete abscess or definite  perforation is evident. There is no significant free fluid.  The more proximal colon is within normal limits.  The appendix is not discretely visualized and may be surgically absent.  Small bowel is somewhat collapsed distally but without obstruction.  Reactive sized retroperitoneal lymph nodes are stable.  The bone windows demonstrate slight rightward curvature of the thoracolumbar junction.  Fused anterior osteophytes are present.  Age advanced degenerative changes are noted in the hips, right greater than left.  IMPRESSION:  1.  Persistent or recurrent to sigmoid colitis associated diverticular disease.  There is slightly increased to inflammation along the lateral margin. 2.  No discrete abscess or definite perforation. 3.  Inflammatory retroperitoneal lymph nodes. 4. Degenerative changes of the lumbar spine and hips as described.   Original Report Authenticated By: Marin Roberts, M.D.    Jeoffrey Massed, MD  [facility] Pager:[phone]  If 7PM-7AM, please contact night-coverage [email or web address] Password [email or web address] 02/27/2013, 12:17 PM   LOS: 1 day

## 2013-02-27 NOTE — Progress Notes (Signed)
Utilization review complete. Bhumi Godbey RN CCM Case Mgmt phone [phone] 

## 2013-02-27 NOTE — Consult Note (Signed)
Difficult recurrent diverticulitis with concomitant C. difficile infection.. Agree with medical management.Once C. Diff is cleared, patient will likely require sigmoid colectomy. Plan D/W him and I answered his questions. Patient examined, CT reviewed,  and I agree with the assessment and plan  Violeta Gelinas, MD, MPH, FACS Pager: [phone]  02/27/2013 3:13 PM

## 2013-02-28 ENCOUNTER — Encounter (HOSPITAL_COMMUNITY)
Admission: EM | Disposition: A | Discharge status: Home / Self Care | Payer: Self-pay | Source: Home / Self Care | Attending: Internal Medicine

## 2013-02-28 ENCOUNTER — Encounter (HOSPITAL_COMMUNITY): Payer: Self-pay

## 2013-02-28 HISTORY — PX: FLEXIBLE SIGMOIDOSCOPY: SHX5431

## 2013-02-28 LAB — GLUCOSE, CAPILLARY
Glucose-Capillary: 100 mg/dL — ABNORMAL HIGH (ref 70–99)
Glucose-Capillary: 143 mg/dL — ABNORMAL HIGH (ref 70–99)
Glucose-Capillary: 83 mg/dL (ref 70–99)
Glucose-Capillary: 87 mg/dL (ref 70–99)

## 2013-02-28 LAB — BASIC METABOLIC PANEL
BUN: 5 mg/dL — ABNORMAL LOW (ref 6–23)
CO2: 23 mEq/L (ref 19–32)
Calcium: 9.6 mg/dL (ref 8.4–10.5)
Chloride: 101 mEq/L (ref 96–112)
Creatinine, Ser: 0.63 mg/dL (ref 0.50–1.35)
Glucose, Bld: 106 mg/dL — ABNORMAL HIGH (ref 70–99)

## 2013-02-28 LAB — CBC
HCT: 40.1 % (ref 39.0–52.0)
MCH: 31.3 pg (ref 26.0–34.0)
MCV: 92.4 fL (ref 78.0–100.0)
Platelets: 226 10*3/uL (ref 150–400)
RBC: 4.34 MIL/uL (ref 4.22–5.81)
WBC: 11.4 10*3/uL — ABNORMAL HIGH (ref 4.0–10.5)

## 2013-02-28 SURGERY — SIGMOIDOSCOPY, FLEXIBLE
Anesthesia: Moderate Sedation

## 2013-02-28 MED ORDER — MIDAZOLAM HCL 10 MG/2ML IJ SOLN
INTRAMUSCULAR | Status: DC | PRN
  Administered 2013-02-28 (×3): 2.5 mg via INTRAVENOUS

## 2013-02-28 MED ORDER — DEXTROSE-NACL 5-0.9 % IV SOLN
INTRAVENOUS | Status: DC
  Administered 2013-02-28: 500 mL via INTRAVENOUS

## 2013-02-28 MED ORDER — SODIUM CHLORIDE 0.9 % IV SOLN: INTRAVENOUS | Status: DC

## 2013-02-28 MED ORDER — FENTANYL CITRATE 0.05 MG/ML IJ SOLN
INTRAMUSCULAR | Status: DC | PRN
  Administered 2013-02-28 (×3): 25 ug via INTRAVENOUS

## 2013-02-28 MED ORDER — HYDROCHLOROTHIAZIDE 12.5 MG PO CAPS
12.5000 mg | ORAL_CAPSULE | Freq: Every day | ORAL | Status: DC
  Administered 2013-02-28 – 2013-03-01 (×2): 12.5 mg via ORAL
  Filled 2013-02-28 (×2): qty 1

## 2013-02-28 NOTE — Op Note (Signed)
[facility] [address] [place], [address]   FLEXIBLE SIGMOIDOSCOPY  PROCEDURE REPORT  PATIENT: Preston [name], Preston [name]  MR#: [id number] BIRTHDATE: [birth year] , 42  yrs. old GENDER: Male ENDOSCOPIST: Carman Ching, MD REFERRED BY:   [facility] PROCEDURE DATE:  02/28/2013 PROCEDURE:  Flexible Sigmoidoscopy with biopsy ASA CLASS:   class 2 INDICATIONS:  history of re-current diverticulitis now with positive C. difficile toxin. Patient on antibiotics to treat for diverticulitis as well as vancomycin for C. difficile MEDICATIONS:    fentanyl 75 mcg,  versed 7.5 mg IV  DESCRIPTION OF PROCEDURE: ThePentax pediatric colonoscope was inserted following digital rectal exam. The patient had been prepped with tap water enemas. We advanced up to 70 to 80 cm well into the descending colon. Throughout the colon, the mucosa did not show any sign whatsoever of C. difficile. There were no pseudomembranes etc. The mucosa in the descending colon was completely normal. At 50 cm from the anal verge a 1/2 cm polyp was discovered. This was not removed since the patient has not been prepped for polypectomy. From approximately 25 cm to 35 cm mucosal was somewhat swollen and inflamed with minimal clear diverticular disease. Biopsies were obtained from the proximal edge of inflammation. The scope was withdrawn in the patient tolerated procedure well.        COMPLICATIONS: None  ENDOSCOPIC IMPRESSION: 1. Inflame Mucosa a minimal nature from 25 to 35 cm could represent mild diverticulitis. Does not have clear extensive diverticular disease. 2. Despite positive C. difficile toxin, no endoscopic evidence of pseudo membranous colitis throughout the entire left: 3. 1/2 cm descending colon polyp at 50 cm. This will need to be removed at some point in the future  RECOMMENDATIONS: 1. At this point I would continue treatment for diverticulitis as well as empiric treatment  for C. difficile since the toxin was positive. We will check the path results. I think the decision will need to be made regarding whether or not to proceed with the sigmoid colectomy.    _______________________________ Rosalie DoctorCarman Ching, MD 02/28/2013 2:22 PM  cc: Dr. Claud Kelp    PATIENT NAME:  Preston [name], Preston [name] MR#: [id number]

## 2013-02-28 NOTE — Progress Notes (Signed)
PATIENT DETAILS Name: Preston [name] Age: 42 y.o. Sex: male Date of Birth: May 29, 1971 Admit Date: 02/26/2013 Admitting Physician Eduard Clos, MD [id number], Keane Scrape, MD  Subjective: Much better today  Assessment/Plan: Principal Problem: Recurrent Diverticulitis - Continue Zosyn - Afebrile, leukocytosis has resolved - Left lower quadrant of the abdomen is still tender- but abdomen is soft, much better. -CCS and GI following-Flex Sigmoidoscopy today  C. difficile colitis - I'm not sure whether patient abdominal pain is from diverticulitis with superimposed C. difficile colitis at this time - I will continue with Zosyn, and oral Flagyl and Florastor.    HTN (hypertension) - Currently moderately controlled -resume HCTZ  Disposition: Remain inpatient  DVT Prophylaxis: Prophylactic Heparin  Code Status: Full code  Family Communication None at bedside  Procedures:  None  CONSULTS:  GI   MEDICATIONS: Scheduled Meds: . allopurinol  300 mg Oral QHS  . metroNIDAZOLE  500 mg Oral Q8H  . piperacillin-tazobactam (ZOSYN)  IV  3.375 g Intravenous Q8H  . saccharomyces boulardii  250 mg Oral BID   Continuous Infusions:   PRN Meds:.acetaminophen, acetaminophen, albuterol, hydrALAZINE, HYDROmorphone (DILAUDID) injection, ondansetron (ZOFRAN) IV, ondansetron  Antibiotics: Anti-infectives   Start     Dose/Rate Route Frequency Ordered Stop   02/27/13 1400  metroNIDAZOLE (FLAGYL) tablet 500 mg     500 mg Oral 3 times per day 02/27/13 1209 03/13/13 1359   02/26/13 2200  piperacillin-tazobactam (ZOSYN) IVPB 3.375 g     3.375 g 12.5 mL/hr over 240 Minutes Intravenous 3 times per day 02/26/13 2156     02/26/13 2115  piperacillin-tazobactam (ZOSYN) IVPB 3.375 g     3.375 g 100 mL/hr over 30 Minutes Intravenous  Once 02/26/13 2111 02/26/13 2317       PHYSICAL EXAM: Vital signs in last 24 hours: Filed Vitals:   02/27/13 0701 02/27/13 1500 02/27/13 2145 02/28/13  0630  BP: 138/95 136/82 151/96 163/91  Pulse: 74 74 75 70  Temp: 97.3 F (36.3 C) 97.6 F (36.4 C) 97.6 F (36.4 C) 97.8 F (36.6 C)  TempSrc: Oral Oral Oral Oral  Resp: 16 18 20 18   Height:      Weight:      SpO2: 99% 99% 98% 98%    Weight change:  Filed Weights   02/26/13 2158  Weight: 118.1 kg (260 lb 5.8 oz)   Body mass index is 33.41 kg/(m^2).   Gen Exam: Awake and alert with clear speech.   Neck: Supple, No JVD.   Chest: B/L Clear.   CVS: S1 S2 Regular, no murmurs.  Abdomen: soft, BS +,  Tender LLQ area, non distended.  Extremities: no edema, lower extremities warm to touch. Neurologic: Non Focal.   Skin: No Rash.  Wounds: N/A.    Intake/Output from previous day:  Intake/Output Summary (Last 24 hours) at 02/28/13 1156 Last data filed at 02/28/13 0630  Gross per 24 hour  Intake    344 ml  Output   1875 ml  Net  -1531 ml   LAB RESULTS: CBC  Recent Labs Lab 02/26/13 1711 02/27/13 0510 02/28/13 0624  WBC 14.3* 10.5 11.4*  HGB 16.3 14.0 13.6  HCT 45.0 40.8 40.1  PLT 274 244 226  MCV 91.6 92.9 92.4  MCH 33.2 31.9 31.3  MCHC 36.2* 34.3 33.9  RDW 13.9 14.0 13.5  LYMPHSABS 2.5 2.4  --   MONOABS 0.8 0.7  --   EOSABS 0.2 0.3  --   BASOSABS 0.0 0.0  --  Chemistries   Recent Labs Lab 02/26/13 1711 02/27/13 0510 02/28/13 0624  NA 137 139 138  K 4.1 3.7 3.6  CL 96 102 101  CO2 27 26 23   GLUCOSE 105* 96 106*  BUN 5* 5* 5*  CREATININE 0.71 0.72 0.63  CALCIUM 10.2 9.4 9.6    CBG:  Recent Labs Lab 02/27/13 1746 02/27/13 2011 02/28/13 0008 02/28/13 0424 02/28/13 0759  GLUCAP 114* 109* 87 100* 98    GFR Estimated Creatinine Clearance: 166 ml/min (by C-G formula based on Cr of 0.63).  Coagulation profile No results found for this basename: INR, PROTIME,  in the last 168 hours  Cardiac Enzymes No results found for this basename: CK, CKMB, TROPONINI, MYOGLOBIN,  in the last 168 hours  No components found with this basename: POCBNP,   No results found for this basename: DDIMER,  in the last 72 hours No results found for this basename: HGBA1C,  in the last 72 hours No results found for this basename: CHOL, HDL, LDLCALC, TRIG, CHOLHDL, LDLDIRECT,  in the last 72 hours No results found for this basename: TSH, T4TOTAL, FREET3, T3FREE, THYROIDAB,  in the last 72 hours No results found for this basename: VITAMINB12, FOLATE, FERRITIN, TIBC, IRON, RETICCTPCT,  in the last 72 hours No results found for this basename: LIPASE, AMYLASE,  in the last 72 hours  Urine Studies No results found for this basename: UACOL, UAPR, USPG, UPH, UTP, UGL, UKET, UBIL, UHGB, UNIT, UROB, ULEU, UEPI, UWBC, URBC, UBAC, CAST, CRYS, UCOM, BILUA,  in the last 72 hours  MICROBIOLOGY: Recent Results (from the past 240 hour(s))  CLOSTRIDIUM DIFFICILE BY PCR     Status: Abnormal   Collection Time    02/27/13  6:32 AM      Result Value Range Status   C difficile by pcr POSITIVE (*) NEGATIVE Final   Comment: CRITICAL RESULT CALLED TO, READ BACK BY AND VERIFIED WITH:     PEREZ RN 10:45 02/27/13 ([email or web address])    RADIOLOGY STUDIES/RESULTS: Ct Abdomen Pelvis W Contrast  02/26/2013   *RADIOLOGY REPORT*  Clinical Data: Lower abdominal pain and distention.  Diarrhea.  CT ABDOMEN AND PELVIS WITH CONTRAST  Technique:  Multidetector CT imaging of the abdomen and pelvis was performed following the standard protocol during bolus administration of intravenous contrast.  Contrast: OMNIPAQUE IOHEXOL 300 MG/ML  SOLN  Comparison: 02/03/2013.  Findings: Again demonstrated is diffuse low density of the liver relative to the spleen.  Normal appearing spleen, pancreas, gallbladder, adrenal glands, kidneys, urinary bladder and prostate gland.  Multiple sigmoid colon diverticula are again demonstrated with a long segment of diffuse sigmoid colon wall thickening with increased pericolonic soft tissue stranding.  No significant change in the small adjacent reactive lymph nodes.  A  lymph node anterior to the proximal iliac arteries remains mildly enlarged with a short axis diameter of 11 mm on image number 57. Mildly prominent retroperitoneal lymph nodes are unchanged.  No evidence of appendicitis.  Clear lung bases.  Previously noted bilateral hip degenerative changes.  Lumbar and lower thoracic spine degenerative changes.  IMPRESSION:  1.  Sigmoid diverticulosis with mildly progressive changes of diverticulitis without abscess. 2.  Stable probable mild reactive adenopathy. 3.  Stable mild hepatic steatosis.   Original Report Authenticated By: Beckie Salts, M.D.   Ct Abdomen Pelvis W Contrast  02/03/2013   *RADIOLOGY REPORT*  Clinical Data: Abdominal pain.  History of diverticulitis.  CT ABDOMEN AND PELVIS WITH CONTRAST  Technique:  Multidetector CT  imaging of the abdomen and pelvis was performed following the standard protocol during bolus administration of intravenous contrast.  Contrast: OMNIPAQUE IOHEXOL 300 MG/ML  SOLN  Comparison: CT abdomen and pelvis 12/29/2012, 12/17/2012, and 11/14/2012.  Findings: The lung bases are clear without focal nodule, mass, or airspace disease.  The heart size is normal.  No significant pleural or pericardial effusion is present.  Mild fatty infiltration is present throughout the liver.  The spleen is within normal limits.  No discrete lesions are present. The stomach, duodenum, and pancreas are within normal limits.  The common bile duct and gallbladder are normal.  The adrenal glands are normal bilaterally.  The kidneys and ureters are within normal limits bilaterally.  There are persistent inflammatory changes surrounding the sigmoid colon.  No discrete abscess or definite perforation is evident. There is no significant free fluid.  The more proximal colon is within normal limits.  The appendix is not discretely visualized and may be surgically absent.  Small bowel is somewhat collapsed distally but without obstruction.  Reactive sized  retroperitoneal lymph nodes are stable.  The bone windows demonstrate slight rightward curvature of the thoracolumbar junction.  Fused anterior osteophytes are present.  Age advanced degenerative changes are noted in the hips, right greater than left.  IMPRESSION:  1.  Persistent or recurrent to sigmoid colitis associated diverticular disease.  There is slightly increased to inflammation along the lateral margin. 2.  No discrete abscess or definite perforation. 3.  Inflammatory retroperitoneal lymph nodes. 4. Degenerative changes of the lumbar spine and hips as described.   Original Report Authenticated By: Marin Roberts, M.D.    Jeoffrey Massed, MD  [facility] Pager:[phone]  If 7PM-7AM, please contact night-coverage [email or web address] Password [email or web address] 02/28/2013, 11:56 AM   LOS: 2 days

## 2013-02-28 NOTE — H&P (View-Only) (Signed)
[facility] CONSULT Reason for consult: Recurrent Diverticulitis Referring Physician: PCP: Dr. Standley Dakins. Surgeon: Dr. Derrell Lolling. G.I.: Dr. Wynona Meals Preston [name] is an 42 y.o. male.  HPI: patient has had diverticulitis since April. He has been hospitalized and has had multiple CT scans and multiple courses of antibiotics. It appears that he had a peridiverticular abscess that appear to resolved by CT scan, however, he reports that he has continued to have left lower quadrant pain ever since April of varying degrees. He Has Seen Dr. Dulce Sellar in the office and plans were made to do a colonoscopy at some point in the future after his pain had resolved and he was no longer on antibiotics. He notes that his pain has been getting progressively worse and in addition he has begun to have diarrhea. He was admitted with a slightly elevated WBC and was found to be positive for C. difficile. He has been negative for C. difficile in the past. CT scan this admission reveal mild diverticulitis manifested by diffuse sigmoid colon wall thickening with pericolonic stranding without clear cut pericolonic abscess.   Past Medical History  Diagnosis Date  . Gout   . Hypertension   . Diverticulitis   . Asthma     Past Surgical History  Procedure Laterality Date  . Appendectomy    . Mouth surgery  2010    Family History  Problem Relation Age of Onset  . Cancer Father     Social History:  reports that he has been smoking Cigarettes.  He has a 29 pack-year smoking history. He does not have any smokeless tobacco history on file. He reports that he drinks about 3.6 ounces of alcohol per week. He reports that he does not use illicit drugs.  Allergies:  Allergies  Allergen Reactions  . Morphine And Related Other (See Comments)    "bad feeling"    Medications; . allopurinol  300 mg Oral QHS  . metroNIDAZOLE  500 mg Oral Q8H  . piperacillin-tazobactam (ZOSYN)  IV  3.375 g Intravenous Q8H  .  saccharomyces boulardii  250 mg Oral BID   PRN Meds acetaminophen, acetaminophen, albuterol, hydrALAZINE, HYDROmorphone (DILAUDID) injection, ondansetron (ZOFRAN) IV, ondansetron Results for orders placed during the hospital encounter of 02/26/13 (from the past 48 hour(s))  CBC WITH DIFFERENTIAL     Status: Abnormal   Collection Time    02/26/13  5:11 PM      Result Value Range   WBC 14.3 (*) 4.0 - 10.5 K/uL   RBC 4.91  4.22 - 5.81 MIL/uL   Hemoglobin 16.3  13.0 - 17.0 g/dL   HCT 40.9  81.1 - 91.4 %   MCV 91.6  78.0 - 100.0 fL   MCH 33.2  26.0 - 34.0 pg   MCHC 36.2 (*) 30.0 - 36.0 g/dL   RDW 78.2  95.6 - 21.3 %   Platelets 274  150 - 400 K/uL   Neutrophils Relative % 76  43 - 77 %   Neutro Abs 10.9 (*) 1.7 - 7.7 K/uL   Lymphocytes Relative 17  12 - 46 %   Lymphs Abs 2.5  0.7 - 4.0 K/uL   Monocytes Relative 6  3 - 12 %   Monocytes Absolute 0.8  0.1 - 1.0 K/uL   Eosinophils Relative 1  0 - 5 %   Eosinophils Absolute 0.2  0.0 - 0.7 K/uL   Basophils Relative 0  0 - 1 %   Basophils Absolute 0.0  0.0 -  0.1 K/uL  COMPREHENSIVE METABOLIC PANEL     Status: Abnormal   Collection Time    02/26/13  5:11 PM      Result Value Range   Sodium 137  135 - 145 mEq/L   Potassium 4.1  3.5 - 5.1 mEq/L   Chloride 96  96 - 112 mEq/L   CO2 27  19 - 32 mEq/L   Glucose, Bld 105 (*) 70 - 99 mg/dL   BUN 5 (*) 6 - 23 mg/dL   Creatinine, Ser 1.30  0.50 - 1.35 mg/dL   Calcium 86.5  8.4 - 78.4 mg/dL   Total Protein 7.9  6.0 - 8.3 g/dL   Albumin 4.4  3.5 - 5.2 g/dL   AST 20  0 - 37 U/L   ALT 36  0 - 53 U/L   Alkaline Phosphatase 60  39 - 117 U/L   Total Bilirubin 0.2 (*) 0.3 - 1.2 mg/dL   GFR calc non Af Amer >90  >90 mL/min   GFR calc Af Amer >90  >90 mL/min   Comment:            The eGFR has been calculated     using the CKD EPI equation.     This calculation has not been     validated in all clinical     situations.     eGFR's persistently     <90 mL/min signify     possible Chronic Kidney  Disease.  LACTIC ACID, PLASMA     Status: None   Collection Time    02/26/13 10:27 PM      Result Value Range   Lactic Acid, Venous 1.6  0.5 - 2.2 mmol/L  GLUCOSE, CAPILLARY     Status: None   Collection Time    02/26/13 11:55 PM      Result Value Range   Glucose-Capillary 83  70 - 99 mg/dL   Comment 1 Documented in Chart     Comment 2 Notify RN    GLUCOSE, CAPILLARY     Status: Abnormal   Collection Time    02/27/13  3:54 AM      Result Value Range   Glucose-Capillary 100 (*) 70 - 99 mg/dL  COMPREHENSIVE METABOLIC PANEL     Status: Abnormal   Collection Time    02/27/13  5:10 AM      Result Value Range   Sodium 139  135 - 145 mEq/L   Potassium 3.7  3.5 - 5.1 mEq/L   Chloride 102  96 - 112 mEq/L   CO2 26  19 - 32 mEq/L   Glucose, Bld 96  70 - 99 mg/dL   BUN 5 (*) 6 - 23 mg/dL   Creatinine, Ser 6.96  0.50 - 1.35 mg/dL   Calcium 9.4  8.4 - 29.5 mg/dL   Total Protein 6.4  6.0 - 8.3 g/dL   Albumin 3.5  3.5 - 5.2 g/dL   AST 18  0 - 37 U/L   ALT 28  0 - 53 U/L   Alkaline Phosphatase 57  39 - 117 U/L   Total Bilirubin 0.3  0.3 - 1.2 mg/dL   GFR calc non Af Amer >90  >90 mL/min   GFR calc Af Amer >90  >90 mL/min   Comment:            The eGFR has been calculated     using the CKD EPI equation.     This calculation has  not been     validated in all clinical     situations.     eGFR's persistently     <90 mL/min signify     possible Chronic Kidney Disease.  CBC WITH DIFFERENTIAL     Status: None   Collection Time    02/27/13  5:10 AM      Result Value Range   WBC 10.5  4.0 - 10.5 K/uL   RBC 4.39  4.22 - 5.81 MIL/uL   Hemoglobin 14.0  13.0 - 17.0 g/dL   HCT 40.9  81.1 - 91.4 %   MCV 92.9  78.0 - 100.0 fL   MCH 31.9  26.0 - 34.0 pg   MCHC 34.3  30.0 - 36.0 g/dL   RDW 78.2  95.6 - 21.3 %   Platelets 244  150 - 400 K/uL   Neutrophils Relative % 67  43 - 77 %   Neutro Abs 7.1  1.7 - 7.7 K/uL   Lymphocytes Relative 23  12 - 46 %   Lymphs Abs 2.4  0.7 - 4.0 K/uL    Monocytes Relative 7  3 - 12 %   Monocytes Absolute 0.7  0.1 - 1.0 K/uL   Eosinophils Relative 3  0 - 5 %   Eosinophils Absolute 0.3  0.0 - 0.7 K/uL   Basophils Relative 0  0 - 1 %   Basophils Absolute 0.0  0.0 - 0.1 K/uL  CLOSTRIDIUM DIFFICILE BY PCR     Status: Abnormal   Collection Time    02/27/13  6:32 AM      Result Value Range   C difficile by pcr POSITIVE (*) NEGATIVE   Comment: CRITICAL RESULT CALLED TO, READ BACK BY AND VERIFIED WITH:     PEREZ RN 10:45 02/27/13 ([email or web address])  GLUCOSE, CAPILLARY     Status: Abnormal   Collection Time    02/27/13  8:14 AM      Result Value Range   Glucose-Capillary 113 (*) 70 - 99 mg/dL   Comment 1 Documented in Chart     Comment 2 Notify RN    GLUCOSE, CAPILLARY     Status: None   Collection Time    02/27/13 11:28 AM      Result Value Range   Glucose-Capillary 95  70 - 99 mg/dL   Comment 1 Documented in Chart     Comment 2 Notify RN      Ct Abdomen Pelvis W Contrast  02/26/2013   *RADIOLOGY REPORT*  Clinical Data: Lower abdominal pain and distention.  Diarrhea.  CT ABDOMEN AND PELVIS WITH CONTRAST  Technique:  Multidetector CT imaging of the abdomen and pelvis was performed following the standard protocol during bolus administration of intravenous contrast.  Contrast: OMNIPAQUE IOHEXOL 300 MG/ML  SOLN  Comparison: 02/03/2013.  Findings: Again demonstrated is diffuse low density of the liver relative to the spleen.  Normal appearing spleen, pancreas, gallbladder, adrenal glands, kidneys, urinary bladder and prostate gland.  Multiple sigmoid colon diverticula are again demonstrated with a long segment of diffuse sigmoid colon wall thickening with increased pericolonic soft tissue stranding.  No significant change in the small adjacent reactive lymph nodes.  A lymph node anterior to the proximal iliac arteries remains mildly enlarged with a short axis diameter of 11 mm on image number 57. Mildly prominent retroperitoneal lymph nodes are  unchanged.  No evidence of appendicitis.  Clear lung bases.  Previously noted bilateral hip degenerative changes.  Lumbar and lower thoracic  spine degenerative changes.  IMPRESSION:  1.  Sigmoid diverticulosis with mildly progressive changes of diverticulitis without abscess. 2.  Stable probable mild reactive adenopathy. 3.  Stable mild hepatic steatosis.   Original Report Authenticated By: Beckie Salts, M.D.              Blood pressure 138/95, pulse 74, temperature 97.3 F (36.3 C), temperature source Oral, resp. rate 16, height 6\' 2"  (1.88 m), weight 118.1 kg (260 lb 5.8 oz), SpO2 99.00%.  Physical exam:   General-- heavy white male laying in a hospital bed watching television in no distress Heart--regular rate and rhythm without murmurs are gallops Lungs-- clear  Abdomen-- none distended with good bowel sounds and generally soft with tenderness and the left lower quadrant    Assessment: 1. Recurrent diverticulitis. The patient has had multiple episodes since April requiring multiple courses of antibiotics. Current CT scan does not clearly show abscess. It is possible that the current thickening of the sigmoid colon could all be C. difficile colitis  2.C. difficile colitis. Not unexpected based on his multiple courses of antibiotics   Plan:  will continue antibiotics for now and we will plan sigmoidoscopy tomorrow. Have discussed this with the patient.      Kia Stavros JR,Milaina Sher L 02/27/2013, 2:09 PM

## 2013-02-28 NOTE — Interval H&P Note (Signed)
History and Physical Interval Note:  02/28/2013 1:47 PM  Preston [name]  has presented today for surgery, with the diagnosis of colitis  The various methods of treatment have been discussed with the patient and family. After consideration of risks, benefits and other options for treatment, the patient has consented to  Procedure(s): FLEXIBLE SIGMOIDOSCOPY (N/A) as a surgical intervention .  The patient's history has been reviewed, patient examined, no change in status, stable for surgery.  I have reviewed the patient's chart and labs.  Questions were answered to the patient's satisfaction.     Karlyn Glasco JR,Izak Anding L

## 2013-02-28 NOTE — Progress Notes (Signed)
  Subjective: Still having some pain.    Objective: Vital signs in last 24 hours: Temp:  [97.6 F (36.4 C)-97.8 F (36.6 C)] 97.8 F (36.6 C) (08/02 0630) Pulse Rate:  [70-75] 70 (08/02 0630) Resp:  [18-20] 18 (08/02 0630) BP: (136-163)/(82-96) 163/91 mmHg (08/02 0630) SpO2:  [98 %-99 %] 98 % (08/02 0630) Last BM Date: 02/27/13  Intake/Output from previous day: 08/01 0701 - 08/02 0700 In: 344 [I.V.:244; IV Piggyback:100] Out: 2875 [Urine:2875] Intake/Output this shift:    General appearance: alert and cooperative Resp: breathing comfortably GI: soft, minimally distended.  minimal tenderness  Lab Results:   Recent Labs  02/27/13 0510 02/28/13 0624  WBC 10.5 11.4*  HGB 14.0 13.6  HCT 40.8 40.1  PLT 244 226   BMET  Recent Labs  02/27/13 0510 02/28/13 0624  NA 139 138  K 3.7 3.6  CL 102 101  CO2 26 23  GLUCOSE 96 106*  BUN 5* 5*  CREATININE 0.72 0.63  CALCIUM 9.4 9.6   PT/INR No results found for this basename: LABPROT, INR,  in the last 72 hours ABG No results found for this basename: PHART, PCO2, PO2, HCO3,  in the last 72 hours  Studies/Results: Ct Abdomen Pelvis W Contrast  02/26/2013   *RADIOLOGY REPORT*  Clinical Data: Lower abdominal pain and distention.  Diarrhea.  CT ABDOMEN AND PELVIS WITH CONTRAST  Technique:  Multidetector CT imaging of the abdomen and pelvis was performed following the standard protocol during bolus administration of intravenous contrast.  Contrast: OMNIPAQUE IOHEXOL 300 MG/ML  SOLN  Comparison: 02/03/2013.  Findings: Again demonstrated is diffuse low density of the liver relative to the spleen.  Normal appearing spleen, pancreas, gallbladder, adrenal glands, kidneys, urinary bladder and prostate gland.  Multiple sigmoid colon diverticula are again demonstrated with a long segment of diffuse sigmoid colon wall thickening with increased pericolonic soft tissue stranding.  No significant change in the small adjacent reactive  lymph nodes.  A lymph node anterior to the proximal iliac arteries remains mildly enlarged with a short axis diameter of 11 mm on image number 57. Mildly prominent retroperitoneal lymph nodes are unchanged.  No evidence of appendicitis.  Clear lung bases.  Previously noted bilateral hip degenerative changes.  Lumbar and lower thoracic spine degenerative changes.  IMPRESSION:  1.  Sigmoid diverticulosis with mildly progressive changes of diverticulitis without abscess. 2.  Stable probable mild reactive adenopathy. 3.  Stable mild hepatic steatosis.   Original Report Authenticated By: Beckie Salts, M.D.    Anti-infectives: Anti-infectives   Start     Dose/Rate Route Frequency Ordered Stop   02/27/13 1400  metroNIDAZOLE (FLAGYL) tablet 500 mg     500 mg Oral 3 times per day 02/27/13 1209 03/13/13 1359   02/26/13 2200  piperacillin-tazobactam (ZOSYN) IVPB 3.375 g     3.375 g 12.5 mL/hr over 240 Minutes Intravenous 3 times per day 02/26/13 2156     02/26/13 2115  piperacillin-tazobactam (ZOSYN) IVPB 3.375 g     3.375 g 100 mL/hr over 30 Minutes Intravenous  Once 02/26/13 2111 02/26/13 2317      Assessment/Plan: s/p Procedure(s): FLEXIBLE SIGMOIDOSCOPY (N/A) Continue antibiotics for clostridium difficile. Follow up with Dr. Derrell Lolling as outpatient for diverticulitis for surgical planning as long as he continues to improve.     LOS: 2 days    [facility] 02/28/2013

## 2013-03-01 LAB — GLUCOSE, CAPILLARY
Glucose-Capillary: 88 mg/dL (ref 70–99)
Glucose-Capillary: 145 mg/dL — ABNORMAL HIGH (ref 70–99)
Glucose-Capillary: 228 mg/dL — ABNORMAL HIGH (ref 70–99)
Glucose-Capillary: 119 mg/dL — ABNORMAL HIGH (ref 70–99)

## 2013-03-01 LAB — BASIC METABOLIC PANEL
BUN: 5 mg/dL — ABNORMAL LOW (ref 6–23)
CO2: 26 mEq/L (ref 19–32)
Calcium: 9.8 mg/dL (ref 8.4–10.5)
Chloride: 98 mEq/L (ref 96–112)
Creatinine, Ser: 0.74 mg/dL (ref 0.50–1.35)
Glucose, Bld: 105 mg/dL — ABNORMAL HIGH (ref 70–99)

## 2013-03-01 MED ORDER — OXYCODONE HCL 5 MG PO TABS
15.0000 mg | ORAL_TABLET | ORAL | Status: DC | PRN
  Administered 2013-03-01: 15 mg via ORAL
  Filled 2013-03-01: qty 3

## 2013-03-01 MED ORDER — SACCHAROMYCES BOULARDII 250 MG PO CAPS: 250.0000 mg | ORAL_CAPSULE | Freq: Two times a day (BID) | ORAL | Status: DC

## 2013-03-01 MED ORDER — ALLOPURINOL 300 MG PO TABS: 300.0000 mg | ORAL_TABLET | Freq: Every day | ORAL | Status: DC

## 2013-03-01 MED ORDER — POTASSIUM CHLORIDE ER 10 MEQ PO TBCR: 10.0000 meq | EXTENDED_RELEASE_TABLET | Freq: Every day | ORAL | Status: DC

## 2013-03-01 MED ORDER — CIPROFLOXACIN HCL 500 MG PO TABS
500.0000 mg | ORAL_TABLET | Freq: Two times a day (BID) | ORAL | Status: DC
  Administered 2013-03-01: 500 mg via ORAL
  Filled 2013-03-01 (×3): qty 1

## 2013-03-01 MED ORDER — OXYCODONE HCL 10 MG PO TABS: 10.0000 mg | ORAL_TABLET | Freq: Four times a day (QID) | ORAL | Status: DC | PRN

## 2013-03-01 MED ORDER — CIPROFLOXACIN HCL 500 MG PO TABS: 500.0000 mg | ORAL_TABLET | Freq: Two times a day (BID) | ORAL | Status: DC

## 2013-03-01 MED ORDER — METRONIDAZOLE 500 MG PO TABS: 500.0000 mg | ORAL_TABLET | Freq: Three times a day (TID) | ORAL | Status: DC

## 2013-03-01 NOTE — Progress Notes (Signed)
[facility] PROGRESS NOTE Subjective Feels better. Tolerating diet to be discharged with probable surgery in a few weeks.  Objective: Vital signs in last 24 hours: Temp:  [97.9 F (36.6 C)-98.4 F (36.9 C)] 98.2 F (36.8 C) (08/03 0515) Pulse Rate:  [71-75] 71 (08/03 0515) Resp:  [13-23] 20 (08/03 0515) BP: (136-162)/(87-113) 136/90 mmHg (08/03 0515) SpO2:  [95 %-100 %] 96 % (08/03 0515) Last BM Date: 02/27/13  Intake/Output from previous day: 08/02 0701 - 08/03 0700 In: 150 [IV Piggyback:150] Out: 1575 [Urine:1575] Intake/Output this shift: Total I/O In: -  Out: 400 [Urine:400]  PE: General-- Heart-- Lungs-- Abdomen--less tender  Lab Results:  Recent Labs  02/26/13 1711 02/27/13 0510 02/28/13 0624  WBC 14.3* 10.5 11.4*  HGB 16.3 14.0 13.6  HCT 45.0 40.8 40.1  PLT 274 244 226   BMET  Recent Labs  02/26/13 1711 02/27/13 0510 02/28/13 0624 03/01/13 0640  NA 137 139 138 136  K 4.1 3.7 3.6 3.8  CL 96 102 101 98  CO2 27 26 23 26   CREATININE 0.71 0.72 0.63 0.74   LFT  Recent Labs  02/26/13 1711 02/27/13 0510  PROT 7.9 6.4  AST 20 18  ALT 36 28  ALKPHOS 60 57  BILITOT 0.2* 0.3   PT/INR No results found for this basename: LABPROT, INR,  in the last 72 hours PANCREAS No results found for this basename: LIPASE,  in the last 72 hours       Studies/Results: No results found.  Medications: I have reviewed the patient's current medications.  Assessment/Plan: 1. Recurrent Diverticulitis 2. C Diff. Toxin positive but no gross PMs on sigmoid  Will be available if needed in future. Discussed c diff extensively with pt and wife.   Ashvik Grundman JR,Holli Rengel L 03/01/2013, 1:06 PM

## 2013-03-01 NOTE — Progress Notes (Signed)
1 Day Post-Op  Subjective: Pt feeling better today, less abdominal pain.  Still have diarrhea, now bulking up.  Tolerating low fiber diet.  Denies fever or chills.    Objective: Vital signs in last 24 hours: Temp:  [97.9 F (36.6 C)-98.4 F (36.9 C)] 98.2 F (36.8 C) (08/03 0515) Pulse Rate:  [71-75] 71 (08/03 0515) Resp:  [13-23] 20 (08/03 0515) BP: (136-162)/(87-113) 136/90 mmHg (08/03 0515) SpO2:  [95 %-100 %] 96 % (08/03 0515) Last BM Date: 02/27/13  Intake/Output from previous day: 08/02 0701 - 08/03 0700 In: 150 [IV Piggyback:150] Out: 1575 [Urine:1575] Intake/Output this shift: Total I/O In: -  Out: 400 [Urine:400]  PE General: alert and oriented x3.  NAD.  VSS Abdomen: soft round and non tender, +bs.  No masses or hernias.   Lab Results:   Recent Labs  02/27/13 0510 02/28/13 0624  WBC 10.5 11.4*  HGB 14.0 13.6  HCT 40.8 40.1  PLT 244 226   BMET  Recent Labs  02/28/13 0624 03/01/13 0640  NA 138 136  K 3.6 3.8  CL 101 98  CO2 23 26  GLUCOSE 106* 105*  BUN 5* 5*  CREATININE 0.63 0.74  CALCIUM 9.6 9.8   Anti-infectives: Anti-infectives   Start     Dose/Rate Route Frequency Ordered Stop   02/27/13 1400  metroNIDAZOLE (FLAGYL) tablet 500 mg     500 mg Oral 3 times per day 02/27/13 1209 03/13/13 1359   02/26/13 2200  piperacillin-tazobactam (ZOSYN) IVPB 3.375 g     3.375 g 12.5 mL/hr over 240 Minutes Intravenous 3 times per day 02/26/13 2156     02/26/13 2115  piperacillin-tazobactam (ZOSYN) IVPB 3.375 g     3.375 g 100 mL/hr over 30 Minutes Intravenous  Once 02/26/13 2111 02/26/13 2317      Assessment/Plan: Recurrent diverticulitis with concurrent c. Diff colitis  -3rd hospitalization since April for diverticulitis -he has improved significantly. tolerating diet.   I would recommend changing to oral cipro and flagyl, if able to tolerate discharge home today. -follow up with Dr. Derrell Lolling 2-3 weeks   LOS: 3 days   Bonner Puna [facility] ANP-BC Pager  [phone]  03/01/2013 10:13 AM

## 2013-03-01 NOTE — Progress Notes (Signed)
Re-evaluated patient-requests discharge, will d/c home today. Pain controlled with Oxycodone.

## 2013-03-01 NOTE — Progress Notes (Signed)
Looks better.  Can change back to PO abx and follow up as outpatient to hopefully schedule for elective colectomy once c diff cleared and he has recovered.

## 2013-03-01 NOTE — Discharge Summary (Signed)
PATIENT DETAILS Name: Preston [name] Age: 42 y.o. Sex: male Date of Birth: 1971/06/16 MRN: [id number]. Admit Date: 02/26/2013 Admitting Physician: Eduard Clos, MD MV[facility]:[id number], Keane Scrape, MD  Recommendations for Outpatient Follow-up:  1. Complete antibiotic therapy for C. difficile colitis and diverticulitis, and then referr to [facility] for elective sigmoid colectomy. Patient did have a sigmoidoscopy this admission 2. Biopsy of the inflamed area taken during sigmoidoscopy, currently pending, please follow at next visit. 3. Will need a full colonoscopy, once diverticulitis and C. difficile colitis have resolved, please refer to [facility] gastroenterology )  PRIMARY DISCHARGE DIAGNOSIS:  Principal Problem:   Diverticulitis Active Problems:   C. difficile colitis   HTN (hypertension)   History of gout    PAST MEDICAL HISTORY: Past Medical History  Diagnosis Date  . Gout   . Hypertension   . Diverticulitis   . Asthma     DISCHARGE MEDICATIONS:   Medication List    STOP taking these medications       amoxicillin-clavulanate 875-125 MG per tablet  Commonly known as:  AUGMENTIN     GOODY HEADACHE PO     HYDROcodone-acetaminophen 5-325 MG per tablet  Commonly known as:  NORCO/VICODIN     ibuprofen 200 MG tablet  Commonly known as:  ADVIL,MOTRIN     indomethacin 25 MG capsule  Commonly known as:  INDOCIN      TAKE these medications       albuterol 108 (90 BASE) MCG/ACT inhaler  Commonly known as:  PROVENTIL HFA;VENTOLIN HFA  Inhale 2 puffs into the lungs every 6 (six) hours as needed for wheezing.     allopurinol 300 MG tablet  Commonly known as:  ZYLOPRIM  Take 1 tablet (300 mg total) by mouth at bedtime.     ciprofloxacin 500 MG tablet  Commonly known as:  CIPRO  Take 1 tablet (500 mg total) by mouth 2 (two) times daily.     hydrochlorothiazide 12.5 MG capsule  Commonly known as:  MICROZIDE  Take 12.5 mg by mouth daily.     metroNIDAZOLE 500 MG tablet  Commonly known as:  FLAGYL  Take 1 tablet (500 mg total) by mouth every 8 (eight) hours.     Oxycodone HCl 10 MG Tabs  Take 1-1.5 tablets (10-15 mg total) by mouth every 6 (six) hours as needed for pain.     potassium chloride 10 MEQ tablet  Commonly known as:  K-DUR  Take 1 tablet (10 mEq total) by mouth daily.     saccharomyces boulardii 250 MG capsule  Commonly known as:  FLORASTOR  Take 1 capsule (250 mg total) by mouth 2 (two) times daily.        ALLERGIES:   Allergies  Allergen Reactions  . Morphine And Related Other (See Comments)    "bad feeling"    BRIEF HPI:  See H&P, Labs, Consult and Test reports for all details in brief, patient was admitted for worsening lower left abdominal pain and diarrhea. Repeat CT scan of the abdomen done on admission showed diverticulitis. He was then admitted for further evaluation and treatment.  CONSULTATIONS:   GI and general surgery  PERTINENT RADIOLOGIC STUDIES: Ct Abdomen Pelvis W Contrast  02/26/2013   *RADIOLOGY REPORT*  Clinical Data: Lower abdominal pain and distention.  Diarrhea.  CT ABDOMEN AND PELVIS WITH CONTRAST  Technique:  Multidetector CT imaging of the abdomen and pelvis was performed following the standard protocol during bolus administration of intravenous contrast.  Contrast:  OMNIPAQUE IOHEXOL 300 MG/ML  SOLN  Comparison: 02/03/2013.  Findings: Again demonstrated is diffuse low density of the liver relative to the spleen.  Normal appearing spleen, pancreas, gallbladder, adrenal glands, kidneys, urinary bladder and prostate gland.  Multiple sigmoid colon diverticula are again demonstrated with a long segment of diffuse sigmoid colon wall thickening with increased pericolonic soft tissue stranding.  No significant change in the small adjacent reactive lymph nodes.  A lymph node anterior to the proximal iliac arteries remains mildly enlarged with a short axis diameter of 11 mm on image  number 57. Mildly prominent retroperitoneal lymph nodes are unchanged.  No evidence of appendicitis.  Clear lung bases.  Previously noted bilateral hip degenerative changes.  Lumbar and lower thoracic spine degenerative changes.  IMPRESSION:  1.  Sigmoid diverticulosis with mildly progressive changes of diverticulitis without abscess. 2.  Stable probable mild reactive adenopathy. 3.  Stable mild hepatic steatosis.   Original Report Authenticated By: Beckie Salts, M.D.   Ct Abdomen Pelvis W Contrast  02/03/2013   *RADIOLOGY REPORT*  Clinical Data: Abdominal pain.  History of diverticulitis.  CT ABDOMEN AND PELVIS WITH CONTRAST  Technique:  Multidetector CT imaging of the abdomen and pelvis was performed following the standard protocol during bolus administration of intravenous contrast.  Contrast: OMNIPAQUE IOHEXOL 300 MG/ML  SOLN  Comparison: CT abdomen and pelvis 12/29/2012, 12/17/2012, and 11/14/2012.  Findings: The lung bases are clear without focal nodule, mass, or airspace disease.  The heart size is normal.  No significant pleural or pericardial effusion is present.  Mild fatty infiltration is present throughout the liver.  The spleen is within normal limits.  No discrete lesions are present. The stomach, duodenum, and pancreas are within normal limits.  The common bile duct and gallbladder are normal.  The adrenal glands are normal bilaterally.  The kidneys and ureters are within normal limits bilaterally.  There are persistent inflammatory changes surrounding the sigmoid colon.  No discrete abscess or definite perforation is evident. There is no significant free fluid.  The more proximal colon is within normal limits.  The appendix is not discretely visualized and may be surgically absent.  Small bowel is somewhat collapsed distally but without obstruction.  Reactive sized retroperitoneal lymph nodes are stable.  The bone windows demonstrate slight rightward curvature of the thoracolumbar junction.   Fused anterior osteophytes are present.  Age advanced degenerative changes are noted in the hips, right greater than left.  IMPRESSION:  1.  Persistent or recurrent to sigmoid colitis associated diverticular disease.  There is slightly increased to inflammation along the lateral margin. 2.  No discrete abscess or definite perforation. 3.  Inflammatory retroperitoneal lymph nodes. 4. Degenerative changes of the lumbar spine and hips as described.   Original Report Authenticated By: Marin Roberts, M.D.     PERTINENT LAB RESULTS: CBC:  Recent Labs  02/27/13 0510 02/28/13 0624  WBC 10.5 11.4*  HGB 14.0 13.6  HCT 40.8 40.1  PLT 244 226   CMET CMP     Component Value Date/Time   NA 136 03/01/2013 0640   K 3.8 03/01/2013 0640   CL 98 03/01/2013 0640   CO2 26 03/01/2013 0640   GLUCOSE 105* 03/01/2013 0640   BUN 5* 03/01/2013 0640   CREATININE 0.74 03/01/2013 0640   CALCIUM 9.8 03/01/2013 0640   PROT 6.4 02/27/2013 0510   ALBUMIN 3.5 02/27/2013 0510   AST 18 02/27/2013 0510   ALT 28 02/27/2013 0510   ALKPHOS 57 02/27/2013  0510   BILITOT 0.3 02/27/2013 0510   GFRNONAA >90 03/01/2013 0640   GFRAA >90 03/01/2013 0640    GFR Estimated Creatinine Clearance: 166 ml/min (by C-G formula based on Cr of 0.74). No results found for this basename: LIPASE, AMYLASE,  in the last 72 hours No results found for this basename: CKTOTAL, CKMB, CKMBINDEX, TROPONINI,  in the last 72 hours No components found with this basename: POCBNP,  No results found for this basename: DDIMER,  in the last 72 hours No results found for this basename: HGBA1C,  in the last 72 hours No results found for this basename: CHOL, HDL, LDLCALC, TRIG, CHOLHDL, LDLDIRECT,  in the last 72 hours No results found for this basename: TSH, T4TOTAL, FREET3, T3FREE, THYROIDAB,  in the last 72 hours No results found for this basename: VITAMINB12, FOLATE, FERRITIN, TIBC, IRON, RETICCTPCT,  in the last 72 hours Coags: No results found for this basename: PT,  INR,  in the last 72 hours Microbiology: Recent Results (from the past 240 hour(s))  CLOSTRIDIUM DIFFICILE BY PCR     Status: Abnormal   Collection Time    02/27/13  6:32 AM      Result Value Range Status   C difficile by pcr POSITIVE (*) NEGATIVE Final   Comment: CRITICAL RESULT CALLED TO, READ BACK BY AND VERIFIED WITH:     PEREZ RN 10:45 02/27/13 ([email or web address])     BRIEF HOSPITAL COURSE:  Recurrent Diverticulitis  - Third episode of diverticulitis since April, patient was admitted and started on IV Zosyn. GI was consulted, patient underwent a flexible sigmoidoscopy on 02/28/13. Surgery was consulted as well. Patient was made n.p.o., with IV antibiotics and IV fluids, patient quickly improved, his leukocytosis resolved, diet was started and was advanced to a low fiber diet. IV Zosyn will now be converted to ciprofloxacin, we will plan a 10 day course of both ciprofloxacin and Flagyl, since he has concomitant C. difficile colitis is well, we will extend out treatment for C. difficile for another 10 days after he has completed ciprofloxacin. General surgery plans to follow this patient in the outpatient setting, for elective sigmoid colectomy once diverticulitis and C. difficile colitis has been adequately treated.  - On exam, patient has mild tenderness in the left lower quadrant area, pain seems to be adequately controlled with oral narcotics, he is requesting discharge home today, will discharge him at his request. I have asked him to make an appointment with Dr. Derrell Lolling in the next 1-2 weeks. He is already established with [facility], and as there contact numbers.   C. difficile colitis  - I'm not sure whether patient abdominal pain is from diverticulitis with superimposed C. difficile colitis at this time  - Patient did have diarrhea on admission, this has almost resolved, he will continue to be on Flagyl, we will plan on extending treatment with Flagyl, 10 days after he completes his  course of ciprofloxacin for diverticulitis.   HTN (hypertension)  - Currently moderately controlled -resumed HCTZ  Gout - Continue with allopurinol  TODAY-DAY OF DISCHARGE:  Subjective:   [employer] today has no headache,no chest abdominal pain,no new weakness tingling or numbness, feels much better wants to go home today. He has tolerated a low fiber diet.  Objective:   Blood pressure 136/90, pulse 71, temperature 98.2 F (36.8 C), temperature source Oral, resp. rate 20, height 6\' 2"  (1.88 m), weight 118.1 kg (260 lb 5.8 oz), SpO2 96.00%.  Intake/Output Summary (Last 24  hours) at 03/01/13 1218 Last data filed at 03/01/13 0730  Gross per 24 hour  Intake    150 ml  Output   1775 ml  Net  -1625 ml   Filed Weights   02/26/13 2158  Weight: 118.1 kg (260 lb 5.8 oz)    Exam Awake Alert, Oriented *3, No new F.N deficits, Normal affect Ong.AT,PERRAL Supple Neck,No JVD, No cervical lymphadenopathy appriciated.  Symmetrical Chest wall movement, Good air movement bilaterally, CTAB RRR,No Gallops,Rubs or new Murmurs, No Parasternal Heave +ve B.Sounds, Abd Soft, Non tender, No organomegaly appriciated, No rebound -guarding or rigidity. No Cyanosis, Clubbing or edema, No new Rash or bruise  DISCHARGE CONDITION: Stable  DISPOSITION: Home  DISCHARGE INSTRUCTIONS:    Activity:  As tolerated   Diet recommendation: Heart Healthy diet  Discharge Orders   Future Orders Complete By Expires     Call MD for:  persistant nausea and vomiting  As directed     Call MD for:  severe uncontrolled pain  As directed     Diet - low sodium heart healthy  As directed     Scheduling Instructions:      Low fibre diet    Increase activity slowly  As directed        Follow-up Information   Follow up with Standley Dakins, MD. Schedule an appointment as soon as possible for a visit in 1 week.   Contact information:   [address] [place] [address] [phone]       Follow up  with Ernestene Mention, MD. Schedule an appointment as soon as possible for a visit in 10 days.   Contact information:   [address] [place] [address] [phone]       Total Time spent on discharge equals 45 minutes.  SignedJeoffrey Massed 03/01/2013 12:18 PM

## 2013-03-01 NOTE — Progress Notes (Signed)
Pt. discharged to floor,verbalized understanding of discharged instruction,medication,restriction,diet and follow up appointment.Baseline Vitals sign stable,Pt comfortable,no sign and symptom of distress. 

## 2013-03-01 NOTE — Progress Notes (Signed)
PATIENT DETAILS Name: Preston [name] Age: 42 y.o. Sex: male Date of Birth: 09/14/1970 Admit Date: 02/26/2013 Admitting Physician Eduard Clos, MD [id number], Keane Scrape, MD  Subjective: Much better today- diarrhea almost resolved  Assessment/Plan: Principal Problem: Recurrent Diverticulitis - Third episode of diverticulitis since April, patient was admitted and started on IV Zosyn. GI was consulted, patient underwent a flexible sigmoidoscopy on 02/28/13. Surgery was consulted as well. Patient was made n.p.o., with IV antibiotics and IV fluids, patient quickly improved, his leukocytosis resolved, diet was started and was advanced to a low fiber diet. IV Zosyn will now be converted to ciprofloxacin, we will plan a 10 day course of both ciprofloxacin and Flagyl, since he has concomitant C. difficile colitis is well, we will extend out treatment for C. difficile for another 10 days after he has completed ciprofloxacin. General surgery  plans to follow this patient in the outpatient setting, for elective sigmoid colectomy once diverticulitis and C. difficile colitis has been adequately treated. - On exam, patient has mild tenderness in the left lower quadrant area, we will see if he can tolerate oral narcotics rather than IV narcotics, if he does he will be discharged home either today or tomorrow morning.  C. difficile colitis - I'm not sure whether patient abdominal pain is from diverticulitis with superimposed C. difficile colitis at this time - Patient did have diarrhea on admission, this has almost resolved, he will continue to be on Flagyl, we will plan on extending treatment with Flagyl, 10 days after he completes his course of ciprofloxacin for diverticulitis.     HTN (hypertension) - Currently moderately controlled -resumed HCTZ  Disposition: Remain inpatient- home likely in a.m.  DVT Prophylaxis: Prophylactic Heparin  Code Status: Full code  Family Communication None at  bedside  Procedures:  None  CONSULTS:  GI   MEDICATIONS: Scheduled Meds: . allopurinol  300 mg Oral QHS  . ciprofloxacin  500 mg Oral BID  . hydrochlorothiazide  12.5 mg Oral Daily  . metroNIDAZOLE  500 mg Oral Q8H  . saccharomyces boulardii  250 mg Oral BID   Continuous Infusions:   PRN Meds:.acetaminophen, acetaminophen, albuterol, hydrALAZINE, HYDROmorphone (DILAUDID) injection, ondansetron (ZOFRAN) IV, ondansetron, oxyCODONE  Antibiotics: Anti-infectives   Start     Dose/Rate Route Frequency Ordered Stop   03/01/13 1115  ciprofloxacin (CIPRO) tablet 500 mg     500 mg Oral 2 times daily 03/01/13 1111     02/27/13 1400  metroNIDAZOLE (FLAGYL) tablet 500 mg     500 mg Oral 3 times per day 02/27/13 1209 03/13/13 1359   02/26/13 2200  piperacillin-tazobactam (ZOSYN) IVPB 3.375 g  Status:  Discontinued     3.375 g 12.5 mL/hr over 240 Minutes Intravenous 3 times per day 02/26/13 2156 03/01/13 1111   02/26/13 2115  piperacillin-tazobactam (ZOSYN) IVPB 3.375 g     3.375 g 100 mL/hr over 30 Minutes Intravenous  Once 02/26/13 2111 02/26/13 2317       PHYSICAL EXAM: Vital signs in last 24 hours: Filed Vitals:   02/28/13 1430 02/28/13 2159 02/28/13 2243 03/01/13 0515  BP: 141/111 160/113 139/87 136/90  Pulse:  73  71  Temp:  97.9 F (36.6 C)  98.2 F (36.8 C)  TempSrc:  Oral  Oral  Resp: 21 18  20   Height:      Weight:      SpO2: 96% 95%  96%    Weight change:  Filed Weights   02/26/13 2158  Weight: 118.1 kg (  260 lb 5.8 oz)   Body mass index is 33.41 kg/(m^2).   Gen Exam: Awake and alert with clear speech.   Neck: Supple, No JVD.   Chest: B/L Clear.   CVS: S1 S2 Regular, no murmurs.  Abdomen: soft, BS +,  Tender LLQ area, non distended.  Extremities: no edema, lower extremities warm to touch. Neurologic: Non Focal.   Skin: No Rash.  Wounds: N/A.    Intake/Output from previous day:  Intake/Output Summary (Last 24 hours) at 03/01/13 1112 Last data filed  at 03/01/13 0730  Gross per 24 hour  Intake    150 ml  Output   1775 ml  Net  -1625 ml   LAB RESULTS: CBC  Recent Labs Lab 02/26/13 1711 02/27/13 0510 02/28/13 0624  WBC 14.3* 10.5 11.4*  HGB 16.3 14.0 13.6  HCT 45.0 40.8 40.1  PLT 274 244 226  MCV 91.6 92.9 92.4  MCH 33.2 31.9 31.3  MCHC 36.2* 34.3 33.9  RDW 13.9 14.0 13.5  LYMPHSABS 2.5 2.4  --   MONOABS 0.8 0.7  --   EOSABS 0.2 0.3  --   BASOSABS 0.0 0.0  --     Chemistries   Recent Labs Lab 02/26/13 1711 02/27/13 0510 02/28/13 0624 03/01/13 0640  NA 137 139 138 136  K 4.1 3.7 3.6 3.8  CL 96 102 101 98  CO2 27 26 23 26   GLUCOSE 105* 96 106* 105*  BUN 5* 5* 5* 5*  CREATININE 0.71 0.72 0.63 0.74  CALCIUM 10.2 9.4 9.6 9.8    CBG:  Recent Labs Lab 02/28/13 2007 03/01/13 0004 03/01/13 0419 03/01/13 0420 03/01/13 0745  GLUCAP 143* 88 228* 145* 119*    GFR Estimated Creatinine Clearance: 166 ml/min (by C-G formula based on Cr of 0.74).  Coagulation profile No results found for this basename: INR, PROTIME,  in the last 168 hours  Cardiac Enzymes No results found for this basename: CK, CKMB, TROPONINI, MYOGLOBIN,  in the last 168 hours  No components found with this basename: POCBNP,  No results found for this basename: DDIMER,  in the last 72 hours No results found for this basename: HGBA1C,  in the last 72 hours No results found for this basename: CHOL, HDL, LDLCALC, TRIG, CHOLHDL, LDLDIRECT,  in the last 72 hours No results found for this basename: TSH, T4TOTAL, FREET3, T3FREE, THYROIDAB,  in the last 72 hours No results found for this basename: VITAMINB12, FOLATE, FERRITIN, TIBC, IRON, RETICCTPCT,  in the last 72 hours No results found for this basename: LIPASE, AMYLASE,  in the last 72 hours  Urine Studies No results found for this basename: UACOL, UAPR, USPG, UPH, UTP, UGL, UKET, UBIL, UHGB, UNIT, UROB, ULEU, UEPI, UWBC, URBC, UBAC, CAST, CRYS, UCOM, BILUA,  in the last 72  hours  MICROBIOLOGY: Recent Results (from the past 240 hour(s))  CLOSTRIDIUM DIFFICILE BY PCR     Status: Abnormal   Collection Time    02/27/13  6:32 AM      Result Value Range Status   C difficile by pcr POSITIVE (*) NEGATIVE Final   Comment: CRITICAL RESULT CALLED TO, READ BACK BY AND VERIFIED WITH:     PEREZ RN 10:45 02/27/13 ([email or web address])    RADIOLOGY STUDIES/RESULTS: Ct Abdomen Pelvis W Contrast  02/26/2013   *RADIOLOGY REPORT*  Clinical Data: Lower abdominal pain and distention.  Diarrhea.  CT ABDOMEN AND PELVIS WITH CONTRAST  Technique:  Multidetector CT imaging of the abdomen and pelvis was performed  following the standard protocol during bolus administration of intravenous contrast.  Contrast: OMNIPAQUE IOHEXOL 300 MG/ML  SOLN  Comparison: 02/03/2013.  Findings: Again demonstrated is diffuse low density of the liver relative to the spleen.  Normal appearing spleen, pancreas, gallbladder, adrenal glands, kidneys, urinary bladder and prostate gland.  Multiple sigmoid colon diverticula are again demonstrated with a long segment of diffuse sigmoid colon wall thickening with increased pericolonic soft tissue stranding.  No significant change in the small adjacent reactive lymph nodes.  A lymph node anterior to the proximal iliac arteries remains mildly enlarged with a short axis diameter of 11 mm on image number 57. Mildly prominent retroperitoneal lymph nodes are unchanged.  No evidence of appendicitis.  Clear lung bases.  Previously noted bilateral hip degenerative changes.  Lumbar and lower thoracic spine degenerative changes.  IMPRESSION:  1.  Sigmoid diverticulosis with mildly progressive changes of diverticulitis without abscess. 2.  Stable probable mild reactive adenopathy. 3.  Stable mild hepatic steatosis.   Original Report Authenticated By: Beckie Salts, M.D.   Ct Abdomen Pelvis W Contrast  02/03/2013   *RADIOLOGY REPORT*  Clinical Data: Abdominal pain.  History of diverticulitis.  CT  ABDOMEN AND PELVIS WITH CONTRAST  Technique:  Multidetector CT imaging of the abdomen and pelvis was performed following the standard protocol during bolus administration of intravenous contrast.  Contrast: OMNIPAQUE IOHEXOL 300 MG/ML  SOLN  Comparison: CT abdomen and pelvis 12/29/2012, 12/17/2012, and 11/14/2012.  Findings: The lung bases are clear without focal nodule, mass, or airspace disease.  The heart size is normal.  No significant pleural or pericardial effusion is present.  Mild fatty infiltration is present throughout the liver.  The spleen is within normal limits.  No discrete lesions are present. The stomach, duodenum, and pancreas are within normal limits.  The common bile duct and gallbladder are normal.  The adrenal glands are normal bilaterally.  The kidneys and ureters are within normal limits bilaterally.  There are persistent inflammatory changes surrounding the sigmoid colon.  No discrete abscess or definite perforation is evident. There is no significant free fluid.  The more proximal colon is within normal limits.  The appendix is not discretely visualized and may be surgically absent.  Small bowel is somewhat collapsed distally but without obstruction.  Reactive sized retroperitoneal lymph nodes are stable.  The bone windows demonstrate slight rightward curvature of the thoracolumbar junction.  Fused anterior osteophytes are present.  Age advanced degenerative changes are noted in the hips, right greater than left.  IMPRESSION:  1.  Persistent or recurrent to sigmoid colitis associated diverticular disease.  There is slightly increased to inflammation along the lateral margin. 2.  No discrete abscess or definite perforation. 3.  Inflammatory retroperitoneal lymph nodes. 4. Degenerative changes of the lumbar spine and hips as described.   Original Report Authenticated By: Marin Roberts, M.D.    Jeoffrey Massed, MD  [facility] Pager:[phone]  If 7PM-7AM,  please contact night-coverage [email or web address] Password [email or web address] 03/01/2013, 11:12 AM   LOS: 3 days

## 2013-03-01 NOTE — Progress Notes (Signed)
Medicated multiple times throughout night for abdominal pain. Continued antibiotic therapy.

## 2013-03-02 ENCOUNTER — Encounter (HOSPITAL_COMMUNITY): Payer: Self-pay | Admitting: Gastroenterology

## 2013-03-05 ENCOUNTER — Telehealth: Payer: Self-pay | Admitting: *Deleted

## 2013-03-05 NOTE — Telephone Encounter (Signed)
NPI number requested - message left that pt is not a pt here at [facility]. Wyatt Haste, RN-BSN

## 2013-03-09 ENCOUNTER — Encounter: Payer: Self-pay | Admitting: Internal Medicine

## 2013-03-09 ENCOUNTER — Ambulatory Visit: Payer: Medicaid Other | Attending: Family Medicine | Admitting: Internal Medicine

## 2013-03-09 VITALS — BP 149/96 | HR 59 | Temp 99.2°F | Resp 16 | Ht 74.0 in | Wt 253.4 lb

## 2013-03-09 DIAGNOSIS — M109 Gout, unspecified: Secondary | ICD-10-CM

## 2013-03-09 DIAGNOSIS — K5792 Diverticulitis of intestine, part unspecified, without perforation or abscess without bleeding: Secondary | ICD-10-CM

## 2013-03-09 DIAGNOSIS — K5732 Diverticulitis of large intestine without perforation or abscess without bleeding: Secondary | ICD-10-CM | POA: Insufficient documentation

## 2013-03-09 DIAGNOSIS — A0472 Enterocolitis due to Clostridium difficile, not specified as recurrent: Secondary | ICD-10-CM

## 2013-03-09 DIAGNOSIS — I10 Essential (primary) hypertension: Secondary | ICD-10-CM

## 2013-03-09 MED ORDER — OXYCODONE HCL 10 MG PO TABS: 10.0000 mg | ORAL_TABLET | Freq: Four times a day (QID) | ORAL | Status: DC | PRN

## 2013-03-09 MED ORDER — HYDROCHLOROTHIAZIDE 12.5 MG PO CAPS: 12.5000 mg | ORAL_CAPSULE | Freq: Every day | ORAL | Status: DC

## 2013-03-09 MED ORDER — SACCHAROMYCES BOULARDII 250 MG PO CAPS: 250.0000 mg | ORAL_CAPSULE | Freq: Two times a day (BID) | ORAL | Status: DC

## 2013-03-09 MED ORDER — ALLOPURINOL 300 MG PO TABS: 300.0000 mg | ORAL_TABLET | Freq: Every day | ORAL | Status: DC

## 2013-03-09 MED ORDER — POTASSIUM CHLORIDE ER 10 MEQ PO TBCR: 10.0000 meq | EXTENDED_RELEASE_TABLET | Freq: Every day | ORAL | Status: DC

## 2013-03-09 NOTE — Progress Notes (Signed)
Patient Demographics  Preston [name], is a 42 y.o. male  WUJ:[id number]  NFA:[id number]  DOB - 29-Oct-1970  Chief Complaint  Patient presents with  . Follow-up    HFU DIVERTICULITIS        Subjective:   [name]. He was recently admitted to the hospital for recurrent left-sided diverticulitis and was also diagnosed with C. difficile colitis. He underwent a flexible sigmoidoscopy during that admission. Upon clinical stability he was discharged home, with plans for outpatient surgery followup for sigmoid colectomy. Today, his pain is significantly improved, his diarrhea is only very minimal and the stools are now more formed. He is feeling much better, he has a appointment with [facility] [place] [facility] on 8/12.  Patient has No headache, No chest pain,No Nausea, No new weakness tingling or numbness, No Cough - SOB.  Objective:    Filed Vitals:   03/09/13 1547  BP: 149/96  Pulse: 59  Temp: 99.2 F (37.3 C)  TempSrc: Oral  Resp: 16  Height: 6\' 2"  (1.88 m)  Weight: 253 lb 6.4 oz (114.941 kg)  SpO2: 97%     ALLERGIES:   Allergies  Allergen Reactions  . Morphine And Related Other (See Comments)    "bad feeling"    PAST MEDICAL HISTORY: Past Medical History  Diagnosis Date  . Gout   . Hypertension   . Diverticulitis   . Asthma     MEDICATIONS AT HOME: Prior to Admission medications   Medication Sig Start Date End Date Taking? Authorizing Provider  albuterol (PROVENTIL HFA;VENTOLIN HFA) 108 (90 BASE) MCG/ACT inhaler Inhale 2 puffs into the lungs every 6 (six) hours as needed for wheezing. 01/07/13  Yes Legend Pecore Levora Dredge, MD  allopurinol (ZYLOPRIM) 300 MG tablet Take 1 tablet (300 mg total) by mouth at bedtime. 03/01/13  Yes Kalif Kattner Levora Dredge, MD  hydrochlorothiazide (MICROZIDE) 12.5 MG capsule Take 12.5 mg by mouth daily.   Yes Historical Provider, MD  metroNIDAZOLE (FLAGYL) 500 MG tablet Take 1 tablet (500 mg total) by mouth every  8 (eight) hours. 03/01/13  Yes Renold Kozar Levora Dredge, MD  oxyCODONE 10 MG TABS Take 1-1.5 tablets (10-15 mg total) by mouth every 6 (six) hours as needed for pain. 03/01/13  Yes Bilal Manzer Levora Dredge, MD  potassium chloride (K-DUR) 10 MEQ tablet Take 1 tablet (10 mEq total) by mouth daily. 03/01/13  Yes Vaishali Baise Levora Dredge, MD  saccharomyces boulardii (FLORASTOR) 250 MG capsule Take 1 capsule (250 mg total) by mouth 2 (two) times daily. 03/01/13  Yes Rowena Moilanen Levora Dredge, MD  ciprofloxacin (CIPRO) 500 MG tablet Take 1 tablet (500 mg total) by mouth 2 (two) times daily. 03/01/13   Miarose Lippert Levora Dredge, MD     Exam  General appearance :Awake, alert, not in any distress. Speech Clear. Not toxic Looking HEENT: Atraumatic and Normocephalic, pupils equally reactive to light and accomodation Neck: supple, no JVD. No cervical lymphadenopathy.  Chest:Good air entry bilaterally, no added sounds  CVS: S1 S2 regular, no murmurs.  Abdomen: Bowel sounds present, very minimal tenderness in the left lower quadrant and not distended with no gaurding, rigidity or rebound. Extremities: B/L Lower Ext shows no edema, both legs are warm to touch Neurology: Awake alert, and oriented X 3, CN II-XII intact, Non focal Skin:No Rash Wounds:N/A    Data Review   CBC No results found for this basename: WBC, HGB, HCT, PLT, MCV, MCH, MCHC, RDW, NEUTRABS, LYMPHSABS, MONOABS, EOSABS, BASOSABS, BANDABS, BANDSABD,  in the last 168 hours  Chemistries   No results found for this basename: NA, K, CL, CO2, GLUCOSE, BUN, CREATININE, GFRCGP, CALCIUM, MG, AST, ALT, ALKPHOS, BILITOT,  in the last 168 hours ------------------------------------------------------------------------------------------------------------------ No results found for this basename: HGBA1C,  in the last 72 hours ------------------------------------------------------------------------------------------------------------------ No results found for this basename: CHOL, HDL, LDLCALC,  TRIG, CHOLHDL, LDLDIRECT,  in the last 72 hours ------------------------------------------------------------------------------------------------------------------ No results found for this basename: TSH, T4TOTAL, FREET3, T3FREE, THYROIDAB,  in the last 72 hours ------------------------------------------------------------------------------------------------------------------ No results found for this basename: VITAMINB12, FOLATE, FERRITIN, TIBC, IRON, RETICCTPCT,  in the last 72 hours  Coagulation profile  No results found for this basename: INR, PROTIME,  in the last 168 hours    Assessment & Plan   Active Problems: Diverticulitis-the recurrent - Continue Cipro and Flagyl as prescribed, once out of Cipro continue Flagyl as noted in the discharge summary - Patient needs followup with [facility] [place] [facility] and needs a sigmoid colectomy  C. difficile colitis - Continue with Flagyl  Gout - Stable  Hypertension - Continue with HCTZ  The patient was given clear instructions to go to ER or return to medical center if symptoms don't improve, worsen or new problems develop. The patient verbalized understanding. The patient was told to call to get lab results if they haven't heard anything in the next week.   Followup in one month

## 2013-03-09 NOTE — Progress Notes (Signed)
PT HERE FOR HFU S/P DIVERTICULOSIS/ CDIFF . D/C'D YESTERDAY. VSS C/O CONTINUED ABDOMINAL PAIN RADIATING TO LOWER BACK. TAKING PRESCRIBED PAIN MEDS FOR RELIEF.

## 2013-03-10 ENCOUNTER — Telehealth (INDEPENDENT_AMBULATORY_CARE_PROVIDER_SITE_OTHER): Payer: Self-pay | Admitting: General Surgery

## 2013-03-10 ENCOUNTER — Encounter (INDEPENDENT_AMBULATORY_CARE_PROVIDER_SITE_OTHER): Payer: Self-pay | Admitting: General Surgery

## 2013-03-10 ENCOUNTER — Encounter: Payer: Self-pay | Admitting: *Deleted

## 2013-03-10 ENCOUNTER — Ambulatory Visit (INDEPENDENT_AMBULATORY_CARE_PROVIDER_SITE_OTHER): Payer: Medicaid Other | Admitting: General Surgery

## 2013-03-10 VITALS — BP 150/100 | HR 70 | Temp 96.0°F | Resp 96 | Ht 74.0 in | Wt 252.6 lb

## 2013-03-10 DIAGNOSIS — Z860100 Personal history of colon polyps, unspecified: Secondary | ICD-10-CM | POA: Insufficient documentation

## 2013-03-10 DIAGNOSIS — K5792 Diverticulitis of intestine, part unspecified, without perforation or abscess without bleeding: Secondary | ICD-10-CM

## 2013-03-10 DIAGNOSIS — K5732 Diverticulitis of large intestine without perforation or abscess without bleeding: Secondary | ICD-10-CM

## 2013-03-10 DIAGNOSIS — A0472 Enterocolitis due to Clostridium difficile, not specified as recurrent: Secondary | ICD-10-CM | POA: Insufficient documentation

## 2013-03-10 NOTE — Progress Notes (Signed)
Patient ID: Preston [name], male   DOB: 03/29/1971, 42 y.o.   MRN: [id number]  Chief Complaint  Patient presents with  . Routine Post Op    f/u from er diverticulitis    HPI Preston [name] is a 42 y.o. male.  He is referred back to me by Dr. Jimmy or fat content practice for consideration of elective sigmoid colectomy for diverticulitis.  This patient was initially hospitalized in April for acute sigmoid diverticulitis, possible perforation with small 2.7 cm abscess. He improved but the pain never completely went away in the left lower quadrant. He was hospitalized again from June 2-5, 2014. CT scan showed that he had acute diverticulitis and the  abscess had essentially resolved. He was discharged once again. He was hospitalized from February 26, 2013 through  03/01/2013 and was diagnosed at with C. Difficile colitis. He was discharged home on Cipro and Flagyl and is still taking that. He's feeling the best he has only says he still has some pain and is worried that he'll have another flareup. He still has some diarrhea but it is better not watery. He is eating normal food. Trying to avoid seeds and nuts as he was advised. He has lost 5-7 pounds in the past 5 months. Basically eating okay.  Dr. Edwards  performed a flexible sigmoidoscopy on August 2 while he was in the hospital. It showed  minimally inflamed mucosa, this was a 25-35 cm.  There were numerous diverticula. No pseudomembranes were seen. He noted a polyp in the descending colon that he stated needs to be resected at some point. CT scan during that hospitalization showed inflamed colon but no abscess. Biopsy of the colon showed minimal colitis.  Past history is significant for hypertension, obesity, current tobacco use, appendectomy. Family history is significant for father dying of cancer, sounds like a metastatic lymphoma. There is no family history of colon cancer.  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  Drinks alcohol but try to taper off. 2 children age 20 and 11.  HPI  Past Medical History  Diagnosis Date  . Gout   . Hypertension   . Diverticulitis   . Asthma     Past Surgical History  Procedure Laterality Date  . Appendectomy    . Mouth surgery  2010  . Flexible sigmoidoscopy N/A 02/28/2013    Procedure: FLEXIBLE SIGMOIDOSCOPY;  Surgeon: James L Edwards Jr., MD;  Location: [facility] END[facility];  Service: Endoscopy;  Laterality: N/A;    Family History  Problem Relation Age of Onset  . Cancer Father     Social History History  Substance Use Topics  . Smoking status: Current Every Day Smoker -- 1.00 packs/day for 29 years    Types: Cigarettes  . Smokeless tobacco: Never Used  . Alcohol Use: 3.6 oz/week    6 Cans of beer per week    Allergies  Allergen Reactions  . Morphine And Related Other (See Comments)    "bad feeling"    Current Outpatient Prescriptions  Medication Sig Dispense Refill  . albuterol (PROVENTIL HFA;VENTOLIN HFA) 108 (90 BASE) MCG/ACT inhaler Inhale 2 puffs into the lungs every 6 (six) hours as needed for wheezing.      . allopurinol (ZYLOPRIM) 300 MG tablet Take 1 tablet (300 mg total) by mouth at bedtime.  30 tablet  3  . ciprofloxacin (CIPRO) 500 MG tablet Take 1 tablet (500 mg total) by mouth 2 (two) times daily.    14 tablet  0  . hydrochlorothiazide (MICROZIDE) 12.5 MG capsule Take 1 capsule (12.5 mg total) by mouth daily.  30 capsule  2  . omeprazole (PRILOSEC) 20 MG capsule Take 20 mg by mouth daily.      . Oxycodone HCl 10 MG TABS Take 1-1.5 tablets (10-15 mg total) by mouth every 6 (six) hours as needed.  30 tablet  0  . potassium chloride (K-DUR) 10 MEQ tablet Take 1 tablet (10 mEq total) by mouth daily.  30 tablet  2  . saccharomyces boulardii (FLORASTOR) 250 MG capsule Take 1 capsule (250 mg total) by mouth 2 (two) times daily.  60 capsule  0  . metroNIDAZOLE (FLAGYL) 500 MG tablet Take 1 tablet (500 mg total) by mouth every 8 (eight) hours.  51  tablet  0   No current facility-administered medications for this visit.    Review of Systems Review of Systems  Constitutional: Negative for fever, chills and unexpected weight change.  HENT: Negative for hearing loss, congestion, sore throat, trouble swallowing and voice change.   Eyes: Negative for visual disturbance.  Respiratory: Negative for cough and wheezing.   Cardiovascular: Negative for chest pain, palpitations and leg swelling.  Gastrointestinal: Positive for abdominal pain and diarrhea. Negative for nausea, vomiting, constipation, blood in stool, abdominal distention, anal bleeding and rectal pain.  Genitourinary: Negative for hematuria and difficulty urinating.  Musculoskeletal: Negative for arthralgias.  Skin: Negative for rash and wound.  Neurological: Negative for seizures, syncope, weakness and headaches.  Hematological: Negative for adenopathy. Does not bruise/bleed easily.  Psychiatric/Behavioral: Negative for confusion.    Blood pressure 150/100, pulse 70, temperature 96 F (35.6 C), temperature source Temporal, resp. rate 96, height 6' 2" (1.88 m), weight 252 lb 9.6 oz (114.579 kg).  Physical Exam Physical Exam  Constitutional: He is oriented to person, place, and time. He appears well-developed and well-nourished. No distress.  HENT:  Head: Normocephalic.  Nose: Nose normal.  Mouth/Throat: No oropharyngeal exudate.  Eyes: Conjunctivae and EOM are normal. Pupils are equal, round, and reactive to light. Right eye exhibits no discharge. Left eye exhibits no discharge. No scleral icterus.  Neck: Normal range of motion. Neck supple. No JVD present. No tracheal deviation present. No thyromegaly present.  Cardiovascular: Normal rate, regular rhythm, normal heart sounds and intact distal pulses.   No murmur heard. Pulmonary/Chest: Effort normal and breath sounds normal. No stridor. No respiratory distress. He has no wheezes. He has no rales. He exhibits no  tenderness.  Abdominal: Soft. Bowel sounds are normal. He exhibits no distension and no mass. There is no tenderness. There is no rebound and no guarding.  Some obesity. Abdomen soft. Subjectively tender in the left lower quadrant but no guarding, mass, or rebound. No hernias noted.  Musculoskeletal: Normal range of motion. He exhibits no edema and no tenderness.  Lymphadenopathy:    He has no cervical adenopathy.  Neurological: He is alert and oriented to person, place, and time. He has normal reflexes. Coordination normal.  Skin: Skin is warm and dry. No rash noted. He is not diaphoretic. No erythema. No pallor.  Psychiatric: He has a normal mood and affect. His behavior is normal. Judgment and thought content normal.    Data Reviewed Flexible sigmoidoscopy report and pathology from biopsies. All hospitalizations from this year CT scans.  Assessment    Recurrent diverticulitis with superimposed C. Difficile colitis. He is improving but I suspect because of his multiple recurrences  And CT findings he will   eventually need to undergo elective sigmoid colectomy.  Unresected descending colon polyp  Tobacco abuse  Obesity  Hypertension  Surgical history of appendectomy     Plan    I advised the patient to continue all his antibiotics until they're completely gone since he seems to be improving on the current regimen  He will be referred back to Dr. Edwards  to see if he is willing to do a total colonoscopy. There are 2 reasons for this, removal of the descending colon polyp, and colorectal cancer screening prior to a major colon resection. If  Dr. Edwards feels this is too risky, then we will simply proceed to planned surgery and accept the risk and do a colonoscopy later. If the patient develops recurrent symptoms in the interim then  I think that we will accelerate the surgical  Scheduling.  I discussed the indications, details, techniques, numerous risk of sigmoid colectomy with  him. I told her that we would try to do part of the surgery laparoscopically and part of the laparoscopic-assisted. I described the incisions. He is aware of the risk of bleeding, infection, injury to adjacent organs, anastomotic leak with reoperation and colostomy, pulmonary and cardiac problems. She has easy she's. He agrees with my plan.       Teige Rountree M. Markesha Hannig, M.D., FACS [facility] General and Minimally invasive Surgery Breast and Colorectal Surgery Office:   [phone] Pager:   [phone]  03/10/2013, 2:40 PM    

## 2013-03-10 NOTE — Patient Instructions (Signed)
It is now apparent that you're having recurrent attacks of acute diverticulitis, and now you have superimposed C. Difficile colitis.  I suspect that you are going to have to undergo an operation to resect the sigmoid colon. Hopefully we can do this electively and avoid a colostomy.  I have reviewed Dr. Randa Evens flexible sigmoidoscopy, which fortunately shows only mild inflammation and no obstruction. There is a polyp in the left colon that needs to be removed. I also think that you need tohave a colonoscopy to rule out colorectal neoplasia, if possible.  Continue the antibiotics as you're taking until they are completely gone.  I'll refer you back to Dr. Randa Evens now for total colonoscopy for the reasons stated above.  If your symptoms recur, call me and we will change our plan and consider doing the surgery without colonoscopy.  Return to see Dr. Derrell Lolling in 3 weeks for final discussion.

## 2013-03-10 NOTE — Telephone Encounter (Signed)
LMOM letting pt know he has an appt w/ Dr. Dulce Sellar w/ [facility] on 03/27/13 with an arrival time of 4:00.

## 2013-03-11 NOTE — Telephone Encounter (Signed)
This encounter was created in error - please disregard.

## 2013-03-31 ENCOUNTER — Encounter (HOSPITAL_COMMUNITY): Payer: Self-pay | Admitting: *Deleted

## 2013-03-31 ENCOUNTER — Other Ambulatory Visit: Payer: Self-pay | Admitting: Gastroenterology

## 2013-03-31 ENCOUNTER — Encounter (HOSPITAL_COMMUNITY): Payer: Self-pay | Admitting: Pharmacy Technician

## 2013-03-31 NOTE — Addendum Note (Signed)
Addended by: Eduar Kumpf on: 03/31/2013 04:30 PM   Modules accepted: Orders  

## 2013-04-01 ENCOUNTER — Ambulatory Visit (HOSPITAL_COMMUNITY)
Admission: RE | Admit: 2013-04-01 | Discharge: 2013-04-01 | Disposition: A | Discharge status: Home / Self Care | Payer: Medicaid Other | Source: Ambulatory Visit | Attending: Gastroenterology | Admitting: Gastroenterology

## 2013-04-01 ENCOUNTER — Ambulatory Visit (HOSPITAL_COMMUNITY): Payer: Medicaid Other | Admitting: Anesthesiology

## 2013-04-01 ENCOUNTER — Encounter (HOSPITAL_COMMUNITY): Payer: Self-pay | Admitting: Anesthesiology

## 2013-04-01 ENCOUNTER — Encounter (HOSPITAL_COMMUNITY)
Admission: RE | Disposition: A | Discharge status: Home / Self Care | Payer: Self-pay | Source: Ambulatory Visit | Attending: Gastroenterology

## 2013-04-01 DIAGNOSIS — E669 Obesity, unspecified: Secondary | ICD-10-CM | POA: Insufficient documentation

## 2013-04-01 DIAGNOSIS — F17200 Nicotine dependence, unspecified, uncomplicated: Secondary | ICD-10-CM | POA: Insufficient documentation

## 2013-04-01 DIAGNOSIS — K5732 Diverticulitis of large intestine without perforation or abscess without bleeding: Secondary | ICD-10-CM | POA: Insufficient documentation

## 2013-04-01 DIAGNOSIS — D126 Benign neoplasm of colon, unspecified: Secondary | ICD-10-CM | POA: Insufficient documentation

## 2013-04-01 DIAGNOSIS — R197 Diarrhea, unspecified: Secondary | ICD-10-CM | POA: Insufficient documentation

## 2013-04-01 DIAGNOSIS — Z9089 Acquired absence of other organs: Secondary | ICD-10-CM | POA: Insufficient documentation

## 2013-04-01 DIAGNOSIS — I10 Essential (primary) hypertension: Secondary | ICD-10-CM | POA: Insufficient documentation

## 2013-04-01 HISTORY — PX: COLONOSCOPY WITH PROPOFOL: SHX5780

## 2013-04-01 HISTORY — DX: Gastro-esophageal reflux disease without esophagitis: K21.9

## 2013-04-01 SURGERY — COLONOSCOPY WITH PROPOFOL
Anesthesia: Monitor Anesthesia Care

## 2013-04-01 MED ORDER — PROPOFOL INFUSION 10 MG/ML OPTIME
INTRAVENOUS | Status: DC | PRN
  Administered 2013-04-01: 300 ug/kg/min via INTRAVENOUS

## 2013-04-01 MED ORDER — SODIUM CHLORIDE 0.9 % IV SOLN: INTRAVENOUS | Status: DC

## 2013-04-01 MED ORDER — LACTATED RINGERS IV SOLN
INTRAVENOUS | Status: DC
  Administered 2013-04-01: 10:00:00 via INTRAVENOUS

## 2013-04-01 SURGICAL SUPPLY — 21 items

## 2013-04-01 NOTE — Transfer of Care (Signed)
Immediate Anesthesia Transfer of Care Note  Patient: Preston [name]  Procedure(s) Performed: Procedure(s): COLONOSCOPY WITH PROPOFOL (N/A)  Patient Location: PACU and Endoscopy Unit  Anesthesia Type:MAC  Level of Consciousness: awake and patient cooperative  Airway & Oxygen Therapy: Patient Spontanous Breathing and Patient connected to nasal cannula oxygen  Post-op Assessment: Report given to PACU RN and Post -op Vital signs reviewed and stable  Post vital signs: Reviewed and stable  Complications: No apparent anesthesia complications

## 2013-04-01 NOTE — Preoperative (Signed)
Beta Blockers   Reason not to administer Beta Blockers:Not Applicable 

## 2013-04-01 NOTE — Anesthesia Preprocedure Evaluation (Addendum)
Anesthesia Evaluation  Patient identified by MRN, date of birth, ID band Patient awake    Reviewed: Allergy & Precautions, H&P , NPO status , Patient's Chart, lab work & pertinent test results  Airway Mallampati: II TM Distance: >3 FB Neck ROM: Full    Dental no notable dental hx.    Pulmonary asthma , Current Smoker,  breath sounds clear to auscultation  Pulmonary exam normal       Cardiovascular Exercise Tolerance: Good hypertension, Pt. on medications negative cardio ROS  Rhythm:Regular Rate:Normal     Neuro/Psych negative neurological ROS  negative psych ROS   GI/Hepatic Neg liver ROS, GERD-  Medicated,  Endo/Other  negative endocrine ROS  Renal/GU negative Renal ROS  negative genitourinary   Musculoskeletal negative musculoskeletal ROS (+)   Abdominal   Peds negative pediatric ROS (+)  Hematology negative hematology ROS (+)   Anesthesia Other Findings   Reproductive/Obstetrics negative OB ROS                          Anesthesia Physical Anesthesia Plan  ASA: II  Anesthesia Plan: MAC   Post-op Pain Management:    Induction: Intravenous  Airway Management Planned:   Additional Equipment:   Intra-op Plan:   Post-operative Plan:   Informed Consent: I have reviewed the patients History and Physical, chart, labs and discussed the procedure including the risks, benefits and alternatives for the proposed anesthesia with the patient or authorized representative who has indicated his/her understanding and acceptance.   Dental advisory given  Plan Discussed with: CRNA  Anesthesia Plan Comments:         Anesthesia Quick Evaluation

## 2013-04-01 NOTE — Op Note (Signed)
[facility] [address] [place], [address]   COLONOSCOPY PROCEDURE REPORT  PATIENT: Preston, [name]  MR#: [id number] BIRTHDATE: [birth year] , 42  yrs. old GENDER: Male ENDOSCOPIST: Willis Modena, MD REFERRED AO:[id number] Derrell Lolling, M.D. PROCEDURE DATE:  04/01/2013 PROCEDURE:   Colonoscopy with snare polypectomy ASA CLASS:   Class II INDICATIONS:recurrent diverticulitis (per CT), colon polyp seen on recent sigmoidoscopy. MEDICATIONS: MAC sedation, administered by CRNA  DESCRIPTION OF PROCEDURE:   After the risks benefits and alternatives of the procedure were thoroughly explained, informed consent was obtained.  A digital rectal exam revealed no abnormalities of the rectum.   The Pentax Ped Colon [id number] endoscope was introduced through the anus and advanced to the cecum, which was identified by both the appendix and ileocecal valve. No adverse events experienced.   The quality of the prep was fair.  The instrument was then slowly withdrawn as the colon was fully examined.     Findings:  Digital rectal exam normal.  Bowel prep diffusely fair; diminutive or subtle sessile polyps could easily have been missed, especially in the proximal colon.  5mm descending colon polyp, removed with snare cautery.  8mm pedunculated polyp in descending colon, removed with snare cautery.  Extensive sigmoid colon edema, but I could not discern any clear diverticula here or anywhere else in the colon.  No other polyps, masses, vascular ectasias, or inflammatory changes were seen.   Retroflexed view of rectum was normal.       Withdrawal time was about 8 minutes     .  The scope was withdrawn and the procedure completed  ENDOSCOPIC IMPRESSION: As above.  RECOMMENDATIONS:     1.  Watch for potential complications of procedure. 2.  Await polypectomy results. 3.  Avoid ASA/NSAIDs for 5 days post-polypectomy. 4.  Follow-up with Dr. Derrell Lolling in office tomorrow 04/02/13, as  already scheduled. 5.  Repeat colonoscopy in 5 years.  eSigned:  Willis Modena, MD 04/01/2013 12:01 PM   cc:

## 2013-04-01 NOTE — Anesthesia Postprocedure Evaluation (Signed)
  Anesthesia Post-op Note  Patient: Preston [name]  Procedure(s) Performed: Procedure(s) (LRB): COLONOSCOPY WITH PROPOFOL (N/A)  Patient Location: PACU  Anesthesia Type: MAC  Level of Consciousness: awake and alert   Airway and Oxygen Therapy: Patient Spontanous Breathing  Post-op Pain: mild  Post-op Assessment: Post-op Vital signs reviewed, Patient's Cardiovascular Status Stable, Respiratory Function Stable, Patent Airway and No signs of Nausea or vomiting  Last Vitals:  Filed Vitals:   04/01/13 1155  BP:   Pulse:   Temp: 36.8 C  Resp:     Post-op Vital Signs: stable   Complications: No apparent anesthesia complications

## 2013-04-01 NOTE — Interval H&P Note (Signed)
History and Physical Interval Note:  04/01/2013 10:14 AM  Allayne Gitelman Sledd  has presented today for surgery, with the diagnosis of abd.pain  The various methods of treatment have been discussed with the patient and family. After consideration of risks, benefits and other options for treatment, the patient has consented to  Procedure(s): COLONOSCOPY WITH PROPOFOL (N/A) as a surgical intervention .  The patient's history has been reviewed, patient examined, no change in status, stable for surgery.  I have reviewed the patient's chart and labs.  Questions were answered to the patient's satisfaction.     Michaele Amundson M  Assessment:  1.  Recurrent diverticulitis, in need of segmental colon resection. 2.  Descending colon polyp.  Plan:  1.  Colonoscopy. 2.  Risks (bleeding, infection, bowel perforation that could require surgery, sedation-related changes in cardiopulmonary systems), benefits (identification and possible treatment of source of symptoms, exclusion of certain causes of symptoms), and alternatives (watchful waiting, radiographic imaging studies, empiric medical treatment) of colonoscopy were explained to patient in detail and patient wishes to proceed.  He was again informed of relative increased risk of colon perforation, given his recent (few weeks ago) bout of diverticulitis.  However, as mentioned extensively elsewhere, he has had multiple diverticulitis recurrences while waiting for 2 months before more ideally proceeding with colonoscopy.

## 2013-04-01 NOTE — H&P (View-Only) (Signed)
Patient ID: Preston [name], male   DOB: 1971-03-16, 42 y.o.   MRN: [id number]  Chief Complaint  Patient presents with  . Routine Post Op    f/u from er diverticulitis    HPI Preston [name] is a 42 y.o. male.  He is referred back to me by Dr. Chanetta Marshall or fat content practice for consideration of elective sigmoid colectomy for diverticulitis.  This patient was initially hospitalized in April for acute sigmoid diverticulitis, possible perforation with small 2.7 cm abscess. He improved but the pain never completely went away in the left lower quadrant. He was hospitalized again from June 2-5, 2014. CT scan showed that he had acute diverticulitis and the  abscess had essentially resolved. He was discharged once again. He was hospitalized from February 26, 2013 through  03/01/2013 and was diagnosed at with C. Difficile colitis. He was discharged home on Cipro and Flagyl and is still taking that. He's feeling the best he has only says he still has some pain and is worried that he'll have another flareup. He still has some diarrhea but it is better not watery. He is eating normal food. Trying to avoid seeds and nuts as he was advised. He has lost 5-7 pounds in the past 5 months. Basically eating okay.  Dr. Randa Evens  performed a flexible sigmoidoscopy on August 2 while he was in the hospital. It showed  minimally inflamed mucosa, this was a 25-35 cm.  There were numerous diverticula. No pseudomembranes were seen. He noted a polyp in the descending colon that he stated needs to be resected at some point. CT scan during that hospitalization showed inflamed colon but no abscess. Biopsy of the colon showed minimal colitis.  Past history is significant for hypertension, obesity, current tobacco use, appendectomy. Family history is significant for father dying of cancer, sounds like a metastatic lymphoma. There is no family history of colon cancer.  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  Drinks alcohol but try to taper off. 2 children age 53 and 65.  HPI  Past Medical History  Diagnosis Date  . Gout   . Hypertension   . Diverticulitis   . Asthma     Past Surgical History  Procedure Laterality Date  . Appendectomy    . Mouth surgery  2010  . Flexible sigmoidoscopy N/A 02/28/2013    Procedure: FLEXIBLE SIGMOIDOSCOPY;  Surgeon: Vertell Novak., MD;  Location: [facility] ENDOSCOPY;  Service: Endoscopy;  Laterality: N/A;    Family History  Problem Relation Age of Onset  . Cancer Father     Social History History  Substance Use Topics  . Smoking status: Current Every Day Smoker -- 1.00 packs/day for 29 years    Types: Cigarettes  . Smokeless tobacco: Never Used  . Alcohol Use: 3.6 oz/week    6 Cans of beer per week    Allergies  Allergen Reactions  . Morphine And Related Other (See Comments)    "bad feeling"    Current Outpatient Prescriptions  Medication Sig Dispense Refill  . albuterol (PROVENTIL HFA;VENTOLIN HFA) 108 (90 BASE) MCG/ACT inhaler Inhale 2 puffs into the lungs every 6 (six) hours as needed for wheezing.      [name] allopurinol (ZYLOPRIM) 300 MG tablet Take 1 tablet (300 mg total) by mouth at bedtime.  30 tablet  3  . ciprofloxacin (CIPRO) 500 MG tablet Take 1 tablet (500 mg total) by mouth 2 (two) times daily.  14 tablet  0  . hydrochlorothiazide (MICROZIDE) 12.5 MG capsule Take 1 capsule (12.5 mg total) by mouth daily.  30 capsule  2  . omeprazole (PRILOSEC) 20 MG capsule Take 20 mg by mouth daily.      . Oxycodone HCl 10 MG TABS Take 1-1.5 tablets (10-15 mg total) by mouth every 6 (six) hours as needed.  30 tablet  0  . potassium chloride (K-DUR) 10 MEQ tablet Take 1 tablet (10 mEq total) by mouth daily.  30 tablet  2  . saccharomyces boulardii (FLORASTOR) 250 MG capsule Take 1 capsule (250 mg total) by mouth 2 (two) times daily.  60 capsule  0  . metroNIDAZOLE (FLAGYL) 500 MG tablet Take 1 tablet (500 mg total) by mouth every 8 (eight) hours.  51  tablet  0   No current facility-administered medications for this visit.    Review of Systems Review of Systems  Constitutional: Negative for fever, chills and unexpected weight change.  HENT: Negative for hearing loss, congestion, sore throat, trouble swallowing and voice change.   Eyes: Negative for visual disturbance.  Respiratory: Negative for cough and wheezing.   Cardiovascular: Negative for chest pain, palpitations and leg swelling.  Gastrointestinal: Positive for abdominal pain and diarrhea. Negative for nausea, vomiting, constipation, blood in stool, abdominal distention, anal bleeding and rectal pain.  Genitourinary: Negative for hematuria and difficulty urinating.  Musculoskeletal: Negative for arthralgias.  Skin: Negative for rash and wound.  Neurological: Negative for seizures, syncope, weakness and headaches.  Hematological: Negative for adenopathy. Does not bruise/bleed easily.  Psychiatric/Behavioral: Negative for confusion.    Blood pressure 150/100, pulse 70, temperature 96 F (35.6 C), temperature source Temporal, resp. rate 96, height 6\' 2"  (1.88 m), weight 252 lb 9.6 oz (114.579 kg).  Physical Exam Physical Exam  Constitutional: He is oriented to person, place, and time. He appears well-developed and well-nourished. No distress.  HENT:  Head: Normocephalic.  Nose: Nose normal.  Mouth/Throat: No oropharyngeal exudate.  Eyes: Conjunctivae and EOM are normal. Pupils are equal, round, and reactive to light. Right eye exhibits no discharge. Left eye exhibits no discharge. No scleral icterus.  Neck: Normal range of motion. Neck supple. No JVD present. No tracheal deviation present. No thyromegaly present.  Cardiovascular: Normal rate, regular rhythm, normal heart sounds and intact distal pulses.   No murmur heard. Pulmonary/Chest: Effort normal and breath sounds normal. No stridor. No respiratory distress. He has no wheezes. He has no rales. He exhibits no  tenderness.  Abdominal: Soft. Bowel sounds are normal. He exhibits no distension and no mass. There is no tenderness. There is no rebound and no guarding.  Some obesity. Abdomen soft. Subjectively tender in the left lower quadrant but no guarding, mass, or rebound. No hernias noted.  Musculoskeletal: Normal range of motion. He exhibits no edema and no tenderness.  Lymphadenopathy:    He has no cervical adenopathy.  Neurological: He is alert and oriented to person, place, and time. He has normal reflexes. Coordination normal.  Skin: Skin is warm and dry. No rash noted. He is not diaphoretic. No erythema. No pallor.  Psychiatric: He has a normal mood and affect. His behavior is normal. Judgment and thought content normal.    Data Reviewed Flexible sigmoidoscopy report and pathology from biopsies. All hospitalizations from this year CT scans.  Assessment    Recurrent diverticulitis with superimposed C. Difficile colitis. He is improving but I suspect because of his multiple recurrences  And CT findings he will  eventually need to undergo elective sigmoid colectomy.  Unresected descending colon polyp  Tobacco abuse  Obesity  Hypertension  Surgical history of appendectomy     Plan    I advised the patient to continue all his antibiotics until they're completely gone since he seems to be improving on the current regimen  He will be referred back to Dr. Randa Evens  to see if he is willing to do a total colonoscopy. There are 2 reasons for this, removal of the descending colon polyp, and colorectal cancer screening prior to a major colon resection. If  Dr. Randa Evens feels this is too risky, then we will simply proceed to planned surgery and accept the risk and do a colonoscopy later. If the patient develops recurrent symptoms in the interim then  I think that we will accelerate the surgical  Scheduling.  I discussed the indications, details, techniques, numerous risk of sigmoid colectomy with  him. I told her that we would try to do part of the surgery laparoscopically and part of the laparoscopic-assisted. I described the incisions. He is aware of the risk of bleeding, infection, injury to adjacent organs, anastomotic leak with reoperation and colostomy, pulmonary and cardiac problems. She has easy she's. He agrees with my plan.       Angelia Mould. Derrell Lolling, M.D., [facility] General and Minimally invasive Surgery Breast and Colorectal Surgery Office:   [phone] Pager:   [phone]  03/10/2013, 2:40 PM

## 2013-04-02 ENCOUNTER — Encounter (HOSPITAL_COMMUNITY): Payer: Self-pay | Admitting: Gastroenterology

## 2013-04-02 ENCOUNTER — Ambulatory Visit (INDEPENDENT_AMBULATORY_CARE_PROVIDER_SITE_OTHER): Payer: Medicaid Other | Admitting: General Surgery

## 2013-04-02 VITALS — BP 130/82 | HR 60 | Temp 98.3°F | Resp 18 | Ht 74.5 in | Wt 246.0 lb

## 2013-04-02 DIAGNOSIS — K5792 Diverticulitis of intestine, part unspecified, without perforation or abscess without bleeding: Secondary | ICD-10-CM

## 2013-04-02 DIAGNOSIS — K5732 Diverticulitis of large intestine without perforation or abscess without bleeding: Secondary | ICD-10-CM

## 2013-04-02 DIAGNOSIS — A0472 Enterocolitis due to Clostridium difficile, not specified as recurrent: Secondary | ICD-10-CM

## 2013-04-02 NOTE — Progress Notes (Signed)
Patient ID: Preston [name], male   DOB: [birth year], 42 y.o.   MRN: [id number]  Chief Complaint  Patient presents with  . Follow-up    HPI Preston [name] is a 42 y.o. male.  He returns for further discussion regarding the option of surgical management for his left-sided colitis.  He continues to feel left lower quadrant pain, but it is much more manageable than it used to be. He is tolerating a diet. He has 2 or 3 soft bowel movements per day. He and his wife are  still fearful that he'll have another hospitalization or complication,  Colonoscopy performed by Dr. Dulce Sellar yesterday did not look that bad. There was no obstruction. No diverticular were seen. There was no stricture. No malignancy. No foreign body.There was a little bit of edema of the sigmoid. 2 descending colon polyps were removed and the pathology is pending. Colonoscopy went all the way to the cecum.  Previously  ,his patient was initially hospitalized in April for acute sigmoid diverticulitis, possible perforation with small 2.7 cm abscess. He improved but the pain never completely went away in the left lower quadrant. He was hospitalized again from June 2-5, 2014. CT scan showed that he had acute diverticulitis and the abscess had essentially resolved. He was discharged once again. He was hospitalized from February 26, 2013 through 03/01/2013 and was diagnosed at with C. Difficile colitis. He was discharged home on Cipro and Flagyl and is still taking that. He's feeling the best he has only says he still has some pain and is worried that he'll have another flareup.Preston [name] He is eating normal food. Trying to avoid seeds and nuts as he was advised. He has lost 5-7 pounds in the past 5 months. Basically eating okay.  Dr. Randa Evens performed a flexible sigmoidoscopy on August 2 while he was in the hospital. It showed minimally inflamed mucosa, no pseudomembranes despite PCR positive C.Diff.   This was at 25-35 cm. There were only a few  diverticula.  No pseudomembranes were seen. He noted a polyp in the descending colon.. CT scan during that hospitalization showed inflamed colon but no abscess. Biopsy of the colon showed minimal colitis. Subsequent total colonoscopy as discussed above.  Past history is significant for hypertension, obesity, current tobacco use, appendectomy. Family history is significant for father dying of cancer, sounds like a metastatic lymphoma. There is no family history of colon cancer.  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 Used to smoke 3 packs a day.  Drinks alcohol but try to taper off. 2 children age 61 and 1  HPI  Past Medical History  Diagnosis Date  . Gout   . Hypertension   . Diverticulitis   . Asthma   . GERD (gastroesophageal reflux disease)     Past Surgical History  Procedure Laterality Date  . Appendectomy    . Mouth surgery  2010  . Flexible sigmoidoscopy N/A 02/28/2013    Procedure: FLEXIBLE SIGMOIDOSCOPY;  Surgeon: Vertell Novak., MD;  Location: [facility] ENDOSCOPY;  Service: Endoscopy;  Laterality: N/A;  . Colonoscopy with propofol N/A 04/01/2013    Procedure: COLONOSCOPY WITH PROPOFOL;  Surgeon: Willis Modena, MD;  Location: [facility] ENDOSCOPY;  Service: Endoscopy;  Laterality: N/A;    Family History  Problem Relation Age of Onset  . Cancer Father     Social History History  Substance Use Topics  . Smoking status: Current Every Day Smoker -- 1.00 packs/day for 29  years    Types: Cigarettes  . Smokeless tobacco: Never Used  . Alcohol Use: 3.6 oz/week    6 Cans of beer per week     Comment: drank everyday until 6 months ago and none for 1 month    Allergies  Allergen Reactions  . Morphine And Related Itching, Nausea And Vomiting and Other (See Comments)    "bad feeling"    Current Outpatient Prescriptions  Medication Sig Dispense Refill  . albuterol (PROVENTIL HFA;VENTOLIN HFA) 108 (90 BASE) MCG/ACT inhaler Inhale 2 puffs into the lungs every 6  (six) hours as needed for wheezing.      Preston [name] allopurinol (ZYLOPRIM) 300 MG tablet Take 300 mg by mouth every evening.      . hydrochlorothiazide (MICROZIDE) 12.5 MG capsule Take 12.5 mg by mouth at bedtime.      Preston [name] omeprazole (PRILOSEC) 20 MG capsule Take 20 mg by mouth daily.      . Oxycodone HCl 10 MG TABS Take 1-1.5 tablets (10-15 mg total) by mouth every 6 (six) hours as needed.  30 tablet  0  . saccharomyces boulardii (FLORASTOR) 250 MG capsule Take 1 capsule (250 mg total) by mouth 2 (two) times daily.  60 capsule  0   No current facility-administered medications for this visit.    Review of Systems Review of Systems  Constitutional: Negative for fever, chills and unexpected weight change.  HENT: Negative for hearing loss, congestion, sore throat, trouble swallowing and voice change.   Eyes: Negative for visual disturbance.  Respiratory: Negative for cough and wheezing.   Cardiovascular: Negative for chest pain, palpitations and leg swelling.  Gastrointestinal: Positive for abdominal pain. Negative for nausea, vomiting, diarrhea, constipation, blood in stool, abdominal distention, anal bleeding and rectal pain.  Genitourinary: Negative for hematuria and difficulty urinating.  Musculoskeletal: Negative for arthralgias.  Skin: Negative for rash and wound.  Neurological: Negative for seizures, syncope, weakness and headaches.  Hematological: Negative for adenopathy. Does not bruise/bleed easily.  Psychiatric/Behavioral: Negative for confusion.    Blood pressure 130/82, pulse 60, temperature 98.3 F (36.8 C), temperature source Oral, resp. rate 18, height 6' 2.5" (1.892 m), weight 246 lb (111.585 kg).  Physical Exam Physical Exam  Constitutional: He is oriented to person, place, and time. He appears well-developed and well-nourished. No distress.  HENT:  Head: Normocephalic.  Nose: Nose normal.  Mouth/Throat: No oropharyngeal exudate.  Eyes: Conjunctivae and EOM are normal. Pupils  are equal, round, and reactive to light. Right eye exhibits no discharge. Left eye exhibits no discharge. No scleral icterus.  Neck: Normal range of motion. Neck supple. No JVD present. No tracheal deviation present. No thyromegaly present.  Cardiovascular: Normal rate, regular rhythm, normal heart sounds and intact distal pulses.   No murmur heard. Pulmonary/Chest: Effort normal and breath sounds normal. No stridor. No respiratory distress. He has no wheezes. He has no rales. He exhibits no tenderness.  Abdominal: Soft. Bowel sounds are normal. He exhibits no distension and no mass. There is no tenderness. There is no rebound and no guarding.  Soft. Nondistended. Minimal subjective tenderness left suprapubic area. No mass. No guarding.  Musculoskeletal: Normal range of motion. He exhibits no edema and no tenderness.  Lymphadenopathy:    He has no cervical adenopathy.  Neurological: He is alert and oriented to person, place, and time. He has normal reflexes. Coordination normal.  Skin: Skin is warm and dry. No rash noted. He is not diaphoretic. No erythema. No pallor.  Psychiatric: He has  a normal mood and affect. His behavior is normal. Judgment and thought content normal.    Data Reviewed Recent colonoscopy report from yesterday. My office records. CT scans performed of this year. Pathology pending from colonoscopy. Be scheduled  Assessment    Presumed, but not confirmed recurrent diverticulitis with superimposed C. Difficile colitis. 4 hospitalizations this year, CT scan with the first hospitalization applied microperforation small abscess which has resolved. Subsequent CT scan showed mild inflammation.  Descending colon polyps, removed a colonoscopy yesterday. Otherwise colonoscopy did not show any dramatic findings and did show absence of diverticula, raising the question of diagnosis  Ongoing tobacco abuse, which increases his risk for surgery  Obesity, also a risk factor for  surgery  Hypertension  Surgical history of appendectomy    Plan    We had a very long talk about risks and benefits of surgery versus risk and benefits of ongoing medical management. Certainly his repeated hospitalizations and CT scan findings suggest that he has inflammatory disease of the colon and is at risk for future episodes of inflammation and possible perforation with colostomy. He and his wife are aware of that. They're also aware of the improvement in his general condition lately and the fairly unremarkable colonoscopy yesterday.  He was advised to stop smoking immediately.  Advised to to lose weight. I discussed these risks with him  Emphasis on hydration, high fiber low fat diet  We will get a single column Barium Enema  without air in about 3 weeks and he will return to see me at that time.  I suspect that it is in his interest to undergo elective sigmoid colectomy, and I will most likely offer that to him. They are aware of the significant risks of surgery, which are exacerbated by his smoking and obesity. I think they have a better understanding of the risk benefit analysis at this time.        Angelia Mould. Derrell Lolling, M.D., [facility] General and Minimally invasive Surgery Breast and Colorectal Surgery Office:   [phone] Pager:   [phone]  04/02/2013, 12:58 PM

## 2013-04-02 NOTE — Patient Instructions (Signed)
Your colonoscopy yesterday did not look that bad. There was a little bit of edema, no infection, no diverticula. 2 polyps on the left-sided were removed. The pathology report is pending, but I do not suspect that this is cancer.  We had a long talk today about the risk of surgery, and compared this to the risk of medical management. Since the colonoscopy did not look bad at all, we are going to hold off on surgery in the short term.  You'll be scheduled for a barium enema x-ray of your colon in about 3 weeks.  Return to see Dr. Derrell Lolling after that test is done.  I suspect that you will be offered the option of surgical resection at the next office visit.       Open Colon Resection Colon resection is surgery to take out part or all of the large intestine (colon). It is also called a colectomy.  LET YOUR CAREGIVER KNOW ABOUT:  Any allergies.  All medicines you are taking, including:  Herbs, eyedrops, over-the-counter medicines and creams.  Blood thinners (anticoagulants), aspirin, or other drugs that could affect blood clotting.  Use of steroids (by mouth or as creams).  Previous problems with anesthetics, including local anesthetics.  Possibility of pregnancy, if this applies.  Any history of blood clots.  Any history of bleeding or other blood problems.  Previous surgery.  Smoking history.  Any recent symptoms of colds or infections.  Other health problems. RISKS AND COMPLICATIONS  There are always risks for surgery with medicine that makes you sleep (general anesthetic). They include breathing and heart problems. However, this risk is low for people who have no other health problems. Other complications from colon resection may include:  An infection developing in the area where the surgery was done.  Problems with the incisions including:  Bleeding from an incision.  The wound reopening.  Tissues from inside the abdomen bulging through the incision  (hernia).  Bleeding inside the abdomen.  Reopening of the colon where it was stitched or stapled together. This is a serious complication. Another procedure may be needed to fix the problem.  Damage to other organs in the abdomen.  A blood clot forming in a vein and traveling to the lungs.  Future blockage of the colon. BEFORE THE PROCEDURE  A medical evaluation will be done. This may include:  A physical exam.  Blood tests.  A test to check the heart's rhythm (electrocardiogram).  X-rays, such as magnetic resonance imaging (MRI). This can take pictures of the colon. An MRI uses a magnet, radio waves, and a computer to create a picture of your colon.  Talking with the person who will be in charge of the medicine during the procedure. An open colon resection requires general anesthesia. Ask what you can expect.  Two weeks before the surgery, stop using aspirin and nonsteroidal anti-inflammatory drugs (NSAIDs) for pain relief. This includes prescription drugs and over-the-counter drugs. Also stop taking vitamin E.  If you take blood thinners, ask your caregiver when you should stop taking them.  Do not eat or drink anything for 8 to 12 hours before the surgery. Ask your caregiver if it is okay to take any needed medicines with a sip of water.  Ask your caregiver if you need to arrive early before the procedure.  On the day of your surgery, your caregiver will need to know the last time you had anything to eat or drink. This includes water, gum, and candy.  Make  arrangements in advance for someone to drive you home. PROCEDURE Colon resection can take 1 to 4 hours.  Small monitors will be put on your body. They are used to check your heart, blood pressure, and oxygen level.  You will be given an intravenous line (IV). A needle will be inserted in your arm. Medicine will be able to flow directly into your body through this needle.  You might be given a medicine to help you relax  (sedative).  You will be given a general anesthetic.  A tube may be put in through your nose. It is called a nasogastric tube. It is used to remove stomach juices after surgery until the intestines start working again.  Once you are asleep, the surgeon will make an incision in the abdomen about 6 to 12 inches long.  Clamps are put on both ends of the diseased part of the colon.  The part of the intestine between the clamps is removed.  If possible, the ends of the healthy colon that remain will be stitched or stapled together.  Sometimes, a colostomy is needed. For a colostomy:  An opening (stoma) to the outside is made through the abdomen.  The end of the colon is brought through the opening. It is stitched to the skin.  A bag is attached to the opening. Waste will drain into this bag. The bag is removable.  The colostomy can be temporary or permanent. Ask your surgeon what to expect.  The incision from the colon resection will be closed with stitches or staples. AFTER THE PROCEDURE  You will stay in a recovery area until the anesthesia has worn off. Your blood pressure and pulse will be checked every so often. Then you will be taken to a hospital room.  You will continue to get fluids through the IV for awhile. The IV will be taken out when the colon starts working again.  You will gradually go back to a normal diet.  Some pain is normal after a colon resection. Ask for pain medicine if the pain becomes too much.  You will be urged to get up and start walking after 1 or 2 days, at the most.  If you had a colostomy, your caregiver will explain how it works and what you will need to do.  Most people spend 3 to 7 days in the hospital after this surgery. Ask your caregiver what to expect. Document Released: 05/13/2009 Document Revised: 10/08/2011 Document Reviewed: 12/01/2010 [facility] Patient Information 2014 [place], [place].

## 2013-04-03 ENCOUNTER — Telehealth (INDEPENDENT_AMBULATORY_CARE_PROVIDER_SITE_OTHER): Payer: Self-pay | Admitting: General Surgery

## 2013-04-03 NOTE — Telephone Encounter (Signed)
LMOM asking pt to return my call.  This is so that I may inform him that we have him scheduled for his barium enema at [facility] located at [address] on 04/10/13 at 9:45.  I also need to explain to him that he will need to pick up the barium enema prep from this location before the day of his exam.

## 2013-04-03 NOTE — Telephone Encounter (Signed)
Pt returned call and was advised of date and time of testing. Pt advised to pick up prep.

## 2013-04-03 NOTE — Telephone Encounter (Signed)
Correction: rescheduled for 04/24/13 at 7:45.  Need to inform patient of this information

## 2013-04-09 ENCOUNTER — Ambulatory Visit: Payer: Medicaid Other | Attending: Internal Medicine | Admitting: Internal Medicine

## 2013-04-09 ENCOUNTER — Encounter: Payer: Self-pay | Admitting: Internal Medicine

## 2013-04-09 VITALS — BP 145/106 | HR 106 | Temp 99.2°F | Resp 16 | Ht 74.0 in | Wt 246.0 lb

## 2013-04-09 DIAGNOSIS — M109 Gout, unspecified: Secondary | ICD-10-CM | POA: Insufficient documentation

## 2013-04-09 DIAGNOSIS — I10 Essential (primary) hypertension: Secondary | ICD-10-CM

## 2013-04-09 MED ORDER — OXYCODONE HCL 10 MG PO TABS: 10.0000 mg | ORAL_TABLET | Freq: Four times a day (QID) | ORAL | Status: DC | PRN

## 2013-04-09 NOTE — Progress Notes (Signed)
Pt is here for a F/U visit. Pt is here to have a check up on his Diverticulitis. Pt reports that his ankle is swollen and that it is hard to walk on it. He also is complaining of lower back pain that started 10 days ago, pain scale is a 5 today, pain is like needles, throbbing and shooting down the legs.

## 2013-04-09 NOTE — Patient Instructions (Signed)
Gastroesophageal Reflux Disease, Adult  Gastroesophageal reflux disease (GERD) happens when acid from your stomach flows up into the esophagus. When acid comes in contact with the esophagus, the acid causes soreness (inflammation) in the esophagus. Over time, GERD may create small holes (ulcers) in the lining of the esophagus.  CAUSES   · Increased body weight. This puts pressure on the stomach, making acid rise from the stomach into the esophagus.  · Smoking. This increases acid production in the stomach.  · Drinking alcohol. This causes decreased pressure in the lower esophageal sphincter (valve or ring of muscle between the esophagus and stomach), allowing acid from the stomach into the esophagus.  · Late evening meals and a full stomach. This increases pressure and acid production in the stomach.  · A malformed lower esophageal sphincter.  Sometimes, no cause is found.  SYMPTOMS   · Burning pain in the lower part of the mid-chest behind the breastbone and in the mid-stomach area. This may occur twice a week or more often.  · Trouble swallowing.  · Sore throat.  · Dry cough.  · Asthma-like symptoms including chest tightness, shortness of breath, or wheezing.  DIAGNOSIS   Your caregiver may be able to diagnose GERD based on your symptoms. In some cases, X-rays and other tests may be done to check for complications or to check the condition of your stomach and esophagus.  TREATMENT   Your caregiver may recommend over-the-counter or prescription medicines to help decrease acid production. Ask your caregiver before starting or adding any new medicines.   HOME CARE INSTRUCTIONS   · Change the factors that you can control. Ask your caregiver for guidance concerning weight loss, quitting smoking, and alcohol consumption.  · Avoid foods and drinks that make your symptoms worse, such as:  · Caffeine or alcoholic drinks.  · Chocolate.  · Peppermint or mint flavorings.  · Garlic and onions.  · Spicy foods.  · Citrus fruits,  such as oranges, lemons, or limes.  · Tomato-based foods such as sauce, chili, salsa, and pizza.  · Fried and fatty foods.  · Avoid lying down for the 3 hours prior to your bedtime or prior to taking a nap.  · Eat small, frequent meals instead of large meals.  · Wear loose-fitting clothing. Do not wear anything tight around your waist that causes pressure on your stomach.  · Raise the head of your bed 6 to 8 inches with wood blocks to help you sleep. Extra pillows will not help.  · Only take over-the-counter or prescription medicines for pain, discomfort, or fever as directed by your caregiver.  · Do not take aspirin, ibuprofen, or other nonsteroidal anti-inflammatory drugs (NSAIDs).  SEEK IMMEDIATE MEDICAL CARE IF:   · You have pain in your arms, neck, jaw, teeth, or back.  · Your pain increases or changes in intensity or duration.  · You develop nausea, vomiting, or sweating (diaphoresis).  · You develop shortness of breath, or you faint.  · Your vomit is green, yellow, black, or looks like coffee grounds or blood.  · Your stool is red, bloody, or black.  These symptoms could be signs of other problems, such as heart disease, gastric bleeding, or esophageal bleeding.  MAKE SURE YOU:   · Understand these instructions.  · Will watch your condition.  · Will get help right away if you are not doing well or get worse.  Document Released: 04/25/2005 Document Revised: 10/08/2011 Document Reviewed: 02/02/2011  [employer]® Patient   Information ©2014 [employer].

## 2013-04-09 NOTE — Progress Notes (Signed)
Patient ID: Preston [name], male   DOB: 1971/06/21, 42 y.o.   MRN: [id number]  CC: Followup  HPI: Patient is 42 year old male with history of hypertension and acid reflux, presents to clinic for regular followup. He explains he was scheduled for barium enema study on September 26. He reports compliance with medicines and recommended diet. He explains he is unable to afford Nexium once denies there is any additional medication is more affordable he can take instead. He denies fevers and chills, no specific urinary concerns, no changes in weight and appetite.  Allergies  Allergen Reactions  . Morphine And Related Itching, Nausea And Vomiting and Other (See Comments)    "bad feeling"   Past Medical History  Diagnosis Date  . Gout   . Hypertension   . Diverticulitis   . Asthma   . GERD (gastroesophageal reflux disease)    Current Outpatient Prescriptions on File Prior to Visit  Medication Sig Dispense Refill  . albuterol (PROVENTIL HFA;VENTOLIN HFA) 108 (90 BASE) MCG/ACT inhaler Inhale 2 puffs into the lungs every 6 (six) hours as needed for wheezing.      [name] allopurinol (ZYLOPRIM) 300 MG tablet Take 300 mg by mouth every evening.      . hydrochlorothiazide (MICROZIDE) 12.5 MG capsule Take 12.5 mg by mouth at bedtime.      . saccharomyces boulardii (FLORASTOR) 250 MG capsule Take 1 capsule (250 mg total) by mouth 2 (two) times daily.  60 capsule  0  . omeprazole (PRILOSEC) 20 MG capsule Take 20 mg by mouth daily.       No current facility-administered medications on file prior to visit.   Family History  Problem Relation Age of Onset  . Cancer Father    History   Social History  . Marital Status: Single    Spouse Name: N/A    Number of Children: N/A  . Years of Education: N/A   Occupational History  . Not on file.   Social History Main Topics  . Smoking status: Current Every Day Smoker -- 1.00 packs/day for 29 years    Types: Cigarettes  . Smokeless tobacco: Never Used  .  Alcohol Use: 3.6 oz/week    6 Cans of beer per week     Comment: drank everyday until 6 months ago and none for 1 month  . Drug Use: No  . Sexual Activity: Yes   Other Topics Concern  . Not on file   Social History Narrative  . No narrative on file    Review of Systems  Constitutional: Negative for fever, chills, diaphoresis, activity change, appetite change and fatigue.  HENT: Negative for ear pain, nosebleeds, congestion, facial swelling, rhinorrhea, neck pain, neck stiffness and ear discharge.   Eyes: Negative for pain, discharge, redness, itching and visual disturbance.  Respiratory: Negative for cough, choking, chest tightness, shortness of breath, wheezing and stridor.   Cardiovascular: Negative for chest pain, palpitations and leg swelling.  Gastrointestinal: Negative for abdominal distention.  Genitourinary: Negative for dysuria, urgency, frequency, hematuria, flank pain, decreased urine volume, difficulty urinating and dyspareunia.  Musculoskeletal: Negative for back pain, joint swelling, arthralgias and gait problem.  Neurological: Negative for dizziness, tremors, seizures, syncope, facial asymmetry, speech difficulty, weakness, light-headedness, numbness and headaches.  Hematological: Negative for adenopathy. Does not bruise/bleed easily.  Psychiatric/Behavioral: Negative for hallucinations, behavioral problems, confusion, dysphoric mood, decreased concentration and agitation.    Objective:   Filed Vitals:   04/09/13 1206  BP: 145/106  Pulse: 106  Temp:  99.2 F (37.3 C)  Resp: 16    Physical Exam  Constitutional: Appears well-developed and well-nourished. No distress.  HENT: Normocephalic. External right and left ear normal. Oropharynx is clear and moist.  Eyes: Conjunctivae and EOM are normal. PERRLA, no scleral icterus.  Neck: Normal ROM. Neck supple. No JVD. No tracheal deviation. No thyromegaly.  CVS: RRR, S1/S2 +, no murmurs, no gallops, no carotid bruit.   Pulmonary: Effort and breath sounds normal, no stridor, rhonchi, wheezes, rales.  Abdominal: Soft. BS +,  no distension, tenderness, rebound or guarding.    Lab Results  Component Value Date   WBC 11.4* 02/28/2013   HGB 13.6 02/28/2013   HCT 40.1 02/28/2013   MCV 92.4 02/28/2013   PLT 226 02/28/2013   Lab Results  Component Value Date   CREATININE 0.74 03/01/2013   BUN 5* 03/01/2013   NA 136 03/01/2013   K 3.8 03/01/2013   CL 98 03/01/2013   CO2 26 03/01/2013    No results found for this basename: HGBA1C   Lipid Panel  No results found for this basename: chol, trig, hdl, cholhdl, vldl, ldlcalc       Assessment and plan:   Patient Active Problem List   Diagnosis Date Noted  . HTN (hypertension) - blood pressure slightly above the target range, we have discussed target blood pressure, I have also discussed importance of regular blood pressure checks and to call us back if the numbers are persistently higher than 140/90  11/14/2012  . Gout - supportive care     Acid reflux - dietary recommendations provided along with PPIs over-the-counter.

## 2013-04-10 ENCOUNTER — Other Ambulatory Visit: Payer: Medicaid Other

## 2013-04-17 ENCOUNTER — Telehealth (INDEPENDENT_AMBULATORY_CARE_PROVIDER_SITE_OTHER): Payer: Self-pay | Admitting: *Deleted

## 2013-04-17 NOTE — Telephone Encounter (Signed)
Agree cancel BE.  Not sure what's going on.  Advise liquid diet.  If symptoms progress over weekend will need to present to ER.  If things calm down on liquid diet will need evaluation in office.  Get CBC immediately before next OV with me.

## 2013-04-17 NOTE — Telephone Encounter (Signed)
Patient and wife walked into the office this afternoon after seen Dr. Dulce Sellar downstairs.  Patient reports that since his colonoscopy he has been having rectal bleeding along with pain and abdominal pain 6/10/presssure.  Wife states she is worried this is the beginning of a diverticulitis flare up.  Patient states Dr. Malena Edman office wasn't really able to tell him what the rectal bleeding was coming from however patient because all of this is due to the colonoscopy.  Dr. Hulen Shouts office did order a Hgb but it won't be resulted until Monday.  Spoke to patient about options over the weekend if the rectal bleeding continues and if the pain because unbearable he would need to go to the ED.  Patient states he is trying to avoid that.  Patient states his main reason for coming in is that he doesn't think with the bleeding and the pain that he will be able to endure the barium enema study that Dr. Derrell Lolling scheduled for 04/24/13.  Patient states "there is just no way I can go through that".  Explained to patient that Dr. Derrell Lolling is unavailable today however I will send a message to let him know about what is going on and see what his opinion is on the appropriate plan for the patient.  Explained that Dr. Derrell Lolling will be in office on Monday so hopefully we can get an answer and then give them a call.  Patient states understanding and agreeable at this time.

## 2013-04-18 ENCOUNTER — Inpatient Hospital Stay (HOSPITAL_COMMUNITY)
Admission: EM | Admit: 2013-04-18 | Discharge: 2013-04-20 | DRG: 554 | Disposition: A | Discharge status: Home / Self Care | Payer: Medicaid Other | Attending: Internal Medicine | Admitting: Internal Medicine

## 2013-04-18 ENCOUNTER — Encounter (HOSPITAL_COMMUNITY): Payer: Self-pay | Admitting: Emergency Medicine

## 2013-04-18 ENCOUNTER — Emergency Department (HOSPITAL_COMMUNITY): Payer: Medicaid Other

## 2013-04-18 DIAGNOSIS — R109 Unspecified abdominal pain: Secondary | ICD-10-CM | POA: Diagnosis present

## 2013-04-18 DIAGNOSIS — M0230 Reiter's disease, unspecified site: Secondary | ICD-10-CM

## 2013-04-18 DIAGNOSIS — J45909 Unspecified asthma, uncomplicated: Secondary | ICD-10-CM | POA: Diagnosis present

## 2013-04-18 DIAGNOSIS — K5790 Diverticulosis of intestine, part unspecified, without perforation or abscess without bleeding: Secondary | ICD-10-CM

## 2013-04-18 DIAGNOSIS — F17200 Nicotine dependence, unspecified, uncomplicated: Secondary | ICD-10-CM | POA: Diagnosis present

## 2013-04-18 DIAGNOSIS — I161 Hypertensive emergency: Secondary | ICD-10-CM | POA: Diagnosis present

## 2013-04-18 DIAGNOSIS — K625 Hemorrhage of anus and rectum: Secondary | ICD-10-CM | POA: Diagnosis present

## 2013-04-18 DIAGNOSIS — K219 Gastro-esophageal reflux disease without esophagitis: Secondary | ICD-10-CM | POA: Diagnosis present

## 2013-04-18 DIAGNOSIS — Z79899 Other long term (current) drug therapy: Secondary | ICD-10-CM

## 2013-04-18 DIAGNOSIS — D72829 Elevated white blood cell count, unspecified: Secondary | ICD-10-CM | POA: Diagnosis present

## 2013-04-18 DIAGNOSIS — M130 Polyarthritis, unspecified: Secondary | ICD-10-CM | POA: Diagnosis present

## 2013-04-18 DIAGNOSIS — G8929 Other chronic pain: Secondary | ICD-10-CM | POA: Diagnosis present

## 2013-04-18 DIAGNOSIS — M109 Gout, unspecified: Secondary | ICD-10-CM

## 2013-04-18 DIAGNOSIS — I10 Essential (primary) hypertension: Secondary | ICD-10-CM

## 2013-04-18 LAB — BASIC METABOLIC PANEL
CO2: 28 mEq/L (ref 19–32)
Calcium: 10.4 mg/dL (ref 8.4–10.5)
Chloride: 95 mEq/L — ABNORMAL LOW (ref 96–112)
Creatinine, Ser: 0.6 mg/dL (ref 0.50–1.35)
Glucose, Bld: 82 mg/dL (ref 70–99)
Sodium: 136 mEq/L (ref 135–145)

## 2013-04-18 LAB — CBC WITH DIFFERENTIAL/PLATELET
Eosinophils Absolute: 0.2 10*3/uL (ref 0.0–0.7)
Eosinophils Relative: 1 % (ref 0–5)
Hemoglobin: 15.8 g/dL (ref 13.0–17.0)
Lymphocytes Relative: 13 % (ref 12–46)
Lymphs Abs: 2 10*3/uL (ref 0.7–4.0)
MCH: 32.2 pg (ref 26.0–34.0)
MCV: 89 fL (ref 78.0–100.0)
Monocytes Relative: 6 % (ref 3–12)
RBC: 4.9 MIL/uL (ref 4.22–5.81)

## 2013-04-18 LAB — LACTIC ACID, PLASMA: Lactic Acid, Venous: 1.7 mmol/L (ref 0.5–2.2)

## 2013-04-18 LAB — URIC ACID: Uric Acid, Serum: 4.9 mg/dL (ref 4.0–7.8)

## 2013-04-18 LAB — SEDIMENTATION RATE: Sed Rate: 13 mm/hr (ref 0–16)

## 2013-04-18 MED ORDER — ACETAMINOPHEN 650 MG RE SUPP: 650.0000 mg | Freq: Four times a day (QID) | RECTAL | Status: DC | PRN

## 2013-04-18 MED ORDER — RISAQUAD PO CAPS
1.0000 | ORAL_CAPSULE | Freq: Every day | ORAL | Status: DC
  Administered 2013-04-19 – 2013-04-20 (×2): 1 via ORAL
  Filled 2013-04-18 (×2): qty 1

## 2013-04-18 MED ORDER — DOCUSATE SODIUM 100 MG PO CAPS
100.0000 mg | ORAL_CAPSULE | Freq: Two times a day (BID) | ORAL | Status: DC
  Administered 2013-04-18 – 2013-04-20 (×4): 100 mg via ORAL
  Filled 2013-04-18 (×5): qty 1

## 2013-04-18 MED ORDER — PROBIOTIC PO CAPS: 1.0000 | ORAL_CAPSULE | Freq: Every morning | ORAL | Status: DC

## 2013-04-18 MED ORDER — HYDROMORPHONE HCL PF 1 MG/ML IJ SOLN
1.0000 mg | Freq: Once | INTRAMUSCULAR | Status: AC
  Administered 2013-04-18: 1 mg via INTRAVENOUS
  Filled 2013-04-18: qty 1

## 2013-04-18 MED ORDER — PANTOPRAZOLE SODIUM 40 MG PO TBEC
40.0000 mg | DELAYED_RELEASE_TABLET | Freq: Every day | ORAL | Status: DC
  Administered 2013-04-18 – 2013-04-20 (×3): 40 mg via ORAL
  Filled 2013-04-18 (×2): qty 1

## 2013-04-18 MED ORDER — ACETAMINOPHEN 325 MG PO TABS: 650.0000 mg | ORAL_TABLET | Freq: Four times a day (QID) | ORAL | Status: DC | PRN

## 2013-04-18 MED ORDER — COLCHICINE 0.6 MG PO TABS
0.6000 mg | ORAL_TABLET | Freq: Every day | ORAL | Status: DC
  Administered 2013-04-18 – 2013-04-20 (×3): 0.6 mg via ORAL
  Filled 2013-04-18 (×3): qty 1

## 2013-04-18 MED ORDER — ONDANSETRON 4 MG PO TBDP
4.0000 mg | ORAL_TABLET | Freq: Once | ORAL | Status: AC
  Administered 2013-04-18: 4 mg via ORAL
  Filled 2013-04-18: qty 1

## 2013-04-18 MED ORDER — ONDANSETRON HCL 4 MG PO TABS: 4.0000 mg | ORAL_TABLET | Freq: Four times a day (QID) | ORAL | Status: DC | PRN

## 2013-04-18 MED ORDER — HYDROMORPHONE HCL PF 1 MG/ML IJ SOLN
1.0000 mg | INTRAMUSCULAR | Status: AC | PRN
  Administered 2013-04-19: 1 mg via INTRAVENOUS
  Filled 2013-04-18: qty 1

## 2013-04-18 MED ORDER — OXYCODONE HCL 10 MG PO TB12: 10.0000 mg | ORAL_TABLET | Freq: Four times a day (QID) | ORAL | Status: DC | PRN

## 2013-04-18 MED ORDER — PANTOPRAZOLE SODIUM 40 MG PO TBEC
40.0000 mg | DELAYED_RELEASE_TABLET | Freq: Every day | ORAL | Status: DC
  Filled 2013-04-18: qty 1

## 2013-04-18 MED ORDER — HYDROMORPHONE HCL PF 1 MG/ML IJ SOLN
1.0000 mg | INTRAMUSCULAR | Status: AC
  Administered 2013-04-18: 1 mg via INTRAVENOUS
  Filled 2013-04-18: qty 1

## 2013-04-18 MED ORDER — ONDANSETRON HCL 4 MG/2ML IJ SOLN: 4.0000 mg | Freq: Four times a day (QID) | INTRAMUSCULAR | Status: DC | PRN

## 2013-04-18 MED ORDER — METHYLPREDNISOLONE SODIUM SUCC 125 MG IJ SOLR
80.0000 mg | Freq: Once | INTRAMUSCULAR | Status: AC
  Administered 2013-04-18: 80 mg via INTRAVENOUS
  Filled 2013-04-18: qty 2

## 2013-04-18 MED ORDER — ALLOPURINOL 300 MG PO TABS
300.0000 mg | ORAL_TABLET | Freq: Every evening | ORAL | Status: DC
  Administered 2013-04-18 – 2013-04-19 (×2): 300 mg via ORAL
  Filled 2013-04-18 (×3): qty 1

## 2013-04-18 MED ORDER — OMEPRAZOLE MAGNESIUM 20 MG PO TBEC: 20.0000 mg | DELAYED_RELEASE_TABLET | Freq: Two times a day (BID) | ORAL | Status: DC

## 2013-04-18 MED ORDER — SODIUM CHLORIDE 0.9 % IV SOLN
INTRAVENOUS | Status: DC
  Administered 2013-04-18 – 2013-04-19 (×2): via INTRAVENOUS

## 2013-04-18 MED ORDER — HYDROMORPHONE HCL PF 1 MG/ML IJ SOLN
1.0000 mg | INTRAMUSCULAR | Status: DC | PRN
  Administered 2013-04-18 – 2013-04-20 (×9): 1 mg via INTRAVENOUS
  Filled 2013-04-18 (×9): qty 1

## 2013-04-18 MED ORDER — VANCOMYCIN HCL 10 G IV SOLR
2000.0000 mg | Freq: Once | INTRAVENOUS | Status: AC
  Administered 2013-04-18: 2000 mg via INTRAVENOUS
  Filled 2013-04-18: qty 2000

## 2013-04-18 MED ORDER — ENOXAPARIN SODIUM 40 MG/0.4ML ~~LOC~~ SOLN
40.0000 mg | SUBCUTANEOUS | Status: DC
  Administered 2013-04-19 – 2013-04-20 (×2): 40 mg via SUBCUTANEOUS
  Filled 2013-04-18 (×3): qty 0.4

## 2013-04-18 MED ORDER — ALBUTEROL SULFATE HFA 108 (90 BASE) MCG/ACT IN AERS
2.0000 | INHALATION_SPRAY | Freq: Four times a day (QID) | RESPIRATORY_TRACT | Status: DC | PRN
  Administered 2013-04-19: 2 via RESPIRATORY_TRACT
  Filled 2013-04-18 (×2): qty 6.7

## 2013-04-18 MED ORDER — OXYCODONE HCL ER 10 MG PO T12A
10.0000 mg | EXTENDED_RELEASE_TABLET | Freq: Four times a day (QID) | ORAL | Status: DC | PRN
  Administered 2013-04-19 – 2013-04-20 (×3): 10 mg via ORAL
  Filled 2013-04-18 (×3): qty 1

## 2013-04-18 MED ORDER — VANCOMYCIN HCL IN DEXTROSE 1-5 GM/200ML-% IV SOLN
1000.0000 mg | Freq: Two times a day (BID) | INTRAVENOUS | Status: DC
  Administered 2013-04-19: 10:00:00 1000 mg via INTRAVENOUS
  Filled 2013-04-18 (×2): qty 200

## 2013-04-18 MED ORDER — ONDANSETRON HCL 4 MG/2ML IJ SOLN: 4.0000 mg | Freq: Three times a day (TID) | INTRAMUSCULAR | Status: AC | PRN

## 2013-04-18 NOTE — Progress Notes (Signed)
Preston [name] [id number] Admitted to 4U98: 04/18/2013 11:43 PM Attending Provider: Alba Cory, MD    Preston [name] is a 42 y.o. male patient admitted from ED awake, alert  & orientated  X 3,  Full Code, VSS - Blood pressure 150/91, pulse 92, temperature 97.8 F (36.6 C), temperature source Oral, resp. rate 18, height 6' 2.4" (1.89 m), weight 110.5 kg (243 lb 9.7 oz), SpO2 94.00%.RA, no c/o shortness of breath, no c/o chest pain, no distress noted.    IV site WDL:  with a transparent dsg that's clean dry and intact.  Allergies:   Allergies  Allergen Reactions  . Morphine And Related Itching, Nausea And Vomiting and Other (See Comments)    "bad feeling"     Past Medical History  Diagnosis Date  . Gout   . Hypertension   . Diverticulitis   . Asthma   . GERD (gastroesophageal reflux disease)     History:  obtained from patient  Pt orientation to unit, room and routine. Information packet given to patient and safety video needs to be viewed.  Admission INP armband ID verified with patient, and in place. SR up x 2, fall risk assessment complete with Patient verbalizing understanding of risks associated with falls. Pt verbalizes an understanding of how to use the call bell and to call for help before getting out of bed.  Skin, clean-dry- intact without evidence of bruising, or skin tears.   No evidence of skin break down noted on exam.  Will cont to monitor and assist as needed.  Preston Ponder, RN 04/18/2013 11:43 PM

## 2013-04-18 NOTE — ED Notes (Signed)
Pt presents to Ed with back pain that radiates down left leg. Pt states the pain began 3 weeks ago but has progressively gotten worse the past 3 days to where he is unable to get any sleep. Pt states he is already on oxycodone for diverticulitis that has not helped the pain. Pt denies any loss of control of bowels or bladder. Pt also reports intermittent numbness of both big toes with the pain. Pt axo X4 and nad noted at this time.

## 2013-04-18 NOTE — ED Provider Notes (Signed)
CSN: [id number]     Arrival date & time 04/18/13  1500 History   This chart was scribed for non-physician practitioner Trixie Dredge PA-C, working with Leonette Most B. Bernette Mayers, MD by Leone Payor, ED Scribe. This patient was seen in room TR10C/TR10C and the patient's care was started at 1500.    Chief Complaint  Patient presents with  . Back Pain  . Leg Pain    The history is provided by the patient and the spouse. No language interpreter was used.    HPI Comments: Preston [name] is a 42 y.o. male with a history of gout who presents to the Emergency Department complaining of gradual onset, constant low back pain, left hip pain, left knee pain, and left ankle pain that began a couple of weeks ago which severely worsened in the last few days. Pt also has associated swelling to all of the joints.The pain is constant, throbbing, worse with movement, palpation, and attempting to ambulate.  Occasionally walks with a cane but has switched to crutches because he cannot bear weight on left leg.  He has a history of gout but denies that this is anything like his gout. Denies injury or heavy lifting. He denies any recent tick bites. The last time he was recreationally outdoors was in July 2014. Pt has a history of diverticulitis and takes oxycodone for this. He also had c.diff recently due to the numerous antibiotics he has taken for the diverticulitis. Pt has a history of gout to the great toe and takes allopurinol. Pt has been compliant with his gout medication but states this pain is different from his normal gout. He denies a history of any STDs in the past. Has been having sweats. He denies fever, chills, myalgias, cough, sore throat, CP, SOB, change in recent abdominal pain from diverticulitis, dysuria, hematuria, urgency, frequency, change in bowel or bladder function.   Patient has had recurrent diverticulitis and has been hospitalized four times this year for same.  Has also had c.diff colitis.  Is currently  having problems with rectal bleeding.  Followed by Dr Dulce Sellar and Dr Derrell Lolling.     Past Medical History  Diagnosis Date  . Gout   . Hypertension   . Diverticulitis   . Asthma   . GERD (gastroesophageal reflux disease)    Past Surgical History  Procedure Laterality Date  . Appendectomy    . Mouth surgery  2010  . Flexible sigmoidoscopy N/A 02/28/2013    Procedure: FLEXIBLE SIGMOIDOSCOPY;  Surgeon: Vertell Novak., MD;  Location: [facility] ENDOSCOPY;  Service: Endoscopy;  Laterality: N/A;  . Colonoscopy with propofol N/A 04/01/2013    Procedure: COLONOSCOPY WITH PROPOFOL;  Surgeon: Willis Modena, MD;  Location: [facility] ENDOSCOPY;  Service: Endoscopy;  Laterality: N/A;   Family History  Problem Relation Age of Onset  . Cancer Father    History  Substance Use Topics  . Smoking status: Current Every Day Smoker -- 1.00 packs/day for 29 years    Types: Cigarettes  . Smokeless tobacco: Never Used  . Alcohol Use: 3.6 oz/week    6 Cans of beer per week     Comment: drank everyday until 6 months ago and none for 1 month    Review of Systems  Constitutional: Negative for fever and chills.  HENT: Negative for sore throat.   Respiratory: Negative for cough and shortness of breath.   Cardiovascular: Negative for chest pain.  Gastrointestinal: Negative for abdominal pain.  Genitourinary: Negative for dysuria, urgency, frequency  and hematuria.  Musculoskeletal: Positive for back pain and joint swelling. Negative for myalgias.    Allergies  Morphine and related  Home Medications   Current Outpatient Rx  Name  Route  Sig  Dispense  Refill  . albuterol (PROVENTIL HFA;VENTOLIN HFA) 108 (90 BASE) MCG/ACT inhaler   Inhalation   Inhale 2 puffs into the lungs every 6 (six) hours as needed for wheezing.         [name] allopurinol (ZYLOPRIM) 300 MG tablet   Oral   Take 300 mg by mouth every evening.         . hydrochlorothiazide (MICROZIDE) 12.5 MG capsule   Oral   Take 12.5 mg by mouth at  bedtime.         [name] omeprazole (PRILOSEC) 20 MG capsule   Oral   Take 20 mg by mouth daily.         . Oxycodone HCl 10 MG TABS   Oral   Take 1-1.5 tablets (10-15 mg total) by mouth every 6 (six) hours as needed.   90 tablet   0   . saccharomyces boulardii (FLORASTOR) 250 MG capsule   Oral   Take 1 capsule (250 mg total) by mouth 2 (two) times daily.   60 capsule   0    There were no vitals taken for this visit. Physical Exam  Nursing note and vitals reviewed. Constitutional: He appears well-developed and well-nourished. No distress.  HENT:  Head: Normocephalic and atraumatic.  Neck: Neck supple.  Cardiovascular: Intact distal pulses.   Distal pulse intact.  Pulmonary/Chest: Effort normal.  Abdominal: Soft. He exhibits no distension. There is tenderness in the left lower quadrant. There is no rebound and no guarding.  Musculoskeletal: He exhibits edema and tenderness.  Spine has mild diffuse tenderness over the lumbar/sacral area, no crepitus or stepoffs.   Diffuse tenderness to palpation over left ankle with diffuse edema. No erythema or warmth. Calf is soft and non-tender. No erythema, edema.  Left knee diffusely tender and edematous. No erythema, no warmth.   Mild tenderness in the thigh without edema or erythema or warmth.   Left hip diffusely tender to palpation without noted edema or skin changes.  ROM of hip, knee, ankle are limited secondary to pain and swelling. Left knee cannot fully extend or flex past approximately 45 degrees.   Neurological: He is alert.  Skin: He is not diaphoretic.    ED Course  Procedures (including critical care time)  DIAGNOSTIC STUDIES: Oxygen Saturation is 99% on RA, normal by my interpretation.    COORDINATION OF CARE: 3:34 PM Will order XRAYs for the lumbar spine, left hip, left knee, left ankle. Will also order labs for [facility] spotted fever, Lyme Disease, CBC, BMP, uric acid, sedimentation rate, c-reactive protein.  Discussed treatment plan with pt at bedside and pt agreed to plan.   Labs Review Labs Reviewed  CBC WITH DIFFERENTIAL - Abnormal; Notable for the following:    WBC 15.1 (*)    MCHC 36.2 (*)    Neutrophils Relative % 80 (*)    Neutro Abs 12.0 (*)    All other components within normal limits  BASIC METABOLIC PANEL - Abnormal; Notable for the following:    Chloride 95 (*)    BUN 4 (*)    All other components within normal limits  URIC ACID  SEDIMENTATION RATE  LACTIC ACID, PLASMA  C-REACTIVE PROTEIN  ROCKY MTN SPOTTED FVR AB, IGM-BLOOD  LYME DISEASE DNA BY PCR([facility])  HIV ANTIBODY (ROUTINE TESTING)   Imaging Review Dg Lumbar Spine Complete  04/18/2013   CLINICAL DATA:  Pain  EXAM: LUMBAR SPINE - COMPLETE 4+ VIEW  COMPARISON:  February 26, 2013 CT abdomen and pelvis with reformats  FINDINGS: Frontal, lateral, spot lumbosacral lateral, and bilateral oblique views were obtained. There are 5 non-rib-bearing lumbar type vertebral bodies. There is mild levoscoliosis. There is mild anterior wedging of the T11 vertebral body which appears new compared to prior study. No other fracture. No spondylolisthesis. Disk spaces appear intact. There is no appreciable facet arthropathy.  IMPRESSION: The mild anterior wedging of the T11 vertebral body. No retropulsion is seen in this area. No other evidence of fracture. No spondylolisthesis. No appreciable arthropathy. Mild scoliosis.   Electronically Signed   By: Bretta Bang   On: 04/18/2013 16:31   Dg Hip Complete Left  04/18/2013   CLINICAL DATA:  Pain  EXAM: LEFT HIP - COMPLETE 2+ VIEW  COMPARISON:  CT abdomen and pelvis with coronal reformats February 26, 2013  FINDINGS: Frontal pelvis as well as frontal and lateral left hip images were obtained. Moderate osteoarthritic change is stable in both hip joints. No acute fracture or dislocation. No erosive change.  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 No fracture or  dislocation. Stable appearance compared to prior study.   Electronically Signed   By: Bretta Bang   On: 04/18/2013 16:28   Dg Ankle Complete Left  04/18/2013   CLINICAL DATA:  Pain  EXAM: LEFT ANKLE COMPLETE - 3+ VIEW  COMPARISON:  None.  FINDINGS: Frontal, oblique, and lateral views were obtained. There is no fracture or effusion. Ankle mortise appears intact. No erosive change.  IMPRESSION: No abnormality noted.   Electronically Signed   By: Bretta Bang   On: 04/18/2013 16:26   Dg Knee Complete 4 Views Left  04/18/2013   CLINICAL DATA:  Pain  EXAM: LEFT KNEE - COMPLETE 4+ VIEW  COMPARISON:  None.  FINDINGS: Frontal, lateral, and bilateral oblique views were obtained. There is no fracture or dislocation. No appreciable effusion. Joint spaces appear intact. No erosive change.  IMPRESSION: No fracture or effusion. No appreciable arthropathic change.   Electronically Signed   By: Bretta Bang   On: 04/18/2013 16:27   8:05 PM Discussed patient with Dr Magnus Ivan.  He recommends antiinflammatories.  Dr Magnus Ivan asked to consult.   MDM   1. Reactive arthritis     Patient with low back pain and left lower extremity arthralgias x weeks. Now unable to bear weight on left leg. Pt has pain and edema of joints, particularly of left knee, but not erythema or warmth.  I doubt septic joint.  Pt states this is not his typical gout pain.  Pt denies risk for gonococcal infection. Labs show leukocytosis but normal sed rate.  Xrays show no acute abnormalities and no joint effusions.  Pt does have ongoing problems with diverticulitis, which makes me suspicious that this is a reactive arthritis.  Pt admitted to [facility] hospitalist for pain control and observation overnight, I spoke with ortho and requested consult per [facility]'s request.    I personally performed the services described in this documentation, which was scribed in my presence. The recorded information has been reviewed and is accurate.   Trixie Dredge, PA-C 04/18/13 2028

## 2013-04-18 NOTE — Progress Notes (Signed)
ANTIBIOTIC CONSULT NOTE - INITIAL  Pharmacy Consult for Vancomycin Indication: Possible joint infection  Allergies  Allergen Reactions  . Morphine And Related Itching, Nausea And Vomiting and Other (See Comments)    "bad feeling"    Patient Measurements:  Weight: 111kg  Vital Signs: Temp: 97.9 F (36.6 C) (09/20 1725) Temp src: Oral (09/20 1725) BP: 129/93 mmHg (09/20 1725) Pulse Rate: 93 (09/20 1725) Intake/Output from previous day:   Intake/Output from this shift:    Labs:  Recent Labs  04/18/13 1625  WBC 15.1*  HGB 15.8  PLT 358  CREATININE 0.60   The CrCl is unknown because both a height and weight (above a minimum accepted value) are required for this calculation. No results found for this basename: VANCOTROUGH, VANCOPEAK, VANCORANDOM, GENTTROUGH, GENTPEAK, GENTRANDOM, TOBRATROUGH, TOBRAPEAK, TOBRARND, AMIKACINPEAK, AMIKACINTROU, AMIKACIN,  in the last 72 hours   Microbiology: No results found for this or any previous visit (from the past 720 hour(s)).  Medical History: Past Medical History  Diagnosis Date  . Gout   . Hypertension   . Diverticulitis   . Asthma   . GERD (gastroesophageal reflux disease)     Medications:  See electronic med rec  Assessment: 42yom to start Vancomycin for possible joint infection. Patient is currently afebrile but WBC are elevated at 15.1. - CrCl >100 ml/min - No antibiotics charted in ED  Goal of Therapy:  Vancomycin trough level 15-20 mcg/ml  Plan:  1. Vancomycin 2g IV x 1, then Vancomycin 1g IV q12h 2. Follow-up renal function, cultures, clinical course and order level when indicated  Cleon Dew [phone] 04/18/2013,8:07 PM

## 2013-04-18 NOTE — ED Notes (Signed)
Regular diet tray ordered for pt.

## 2013-04-18 NOTE — H&P (Signed)
[facility] History and Physical  Preston [name] ZOX:[id number] DOB: 1970-09-09 DOA: 04/18/2013  Referring physician: Shelva Majestic PA. PCP: Preston Lewandowsky, MD  Specialists: ortho  Chief Complaint: worsening left knee, ankle hip pain.   HPI: Preston [name] is a 42 y.o. male with PMH significant for recurrent diverticulitis, C diff colitis, hypertension and gout who presents to ED complaining of worsening left hip, knee and ankle pain and swelling over last 3 days  that started 2 weeks prior to admission. He has not been able to walk due to severe pain. He was using crutches, but now he is not able to walk even with the crutches. He denies fever, chill, nausea, vomiting.  He took 5 doses of colchicine for two day without any relieved of joint pain.   Of note, he relates rectal bleeding is better. He started to have rectal bleeding after he had colonoscopy. His gastroenterologist think bleeding is from small mucosa tair after colonoscopy.  Regarding his history of diverticulitis. He has chronic abdominal pain. Abdominal pain is stable not worse. He is having regular bowel movement.   Review of Systems: negative except as per HPI.   Past Medical History  Diagnosis Date  . Gout   . Hypertension   . Diverticulitis   . Asthma   . GERD (gastroesophageal reflux disease)    Past Surgical History  Procedure Laterality Date  . Appendectomy    . Mouth surgery  2010  . Flexible sigmoidoscopy N/A 02/28/2013    Procedure: FLEXIBLE SIGMOIDOSCOPY;  Surgeon: Vertell Novak., MD;  Location: [facility] ENDOSCOPY;  Service: Endoscopy;  Laterality: N/A;  . Colonoscopy with propofol N/A 04/01/2013    Procedure: COLONOSCOPY WITH PROPOFOL;  Surgeon: Willis Modena, MD;  Location: [facility] ENDOSCOPY;  Service: Endoscopy;  Laterality: N/A;   Social History:  reports that he has been smoking Cigarettes.  He has a 29 pack-year smoking history. He has never used smokeless tobacco. He reports that he does not drink alcohol  or use illicit drugs.   Allergies  Allergen Reactions  . Morphine And Related Itching, Nausea And Vomiting and Other (See Comments)    "bad feeling"    Family History  Problem Relation Age of Onset  . Cancer Father     Prior to Admission medications   Medication Sig Start Date End Date Taking? Authorizing Provider  albuterol (PROVENTIL HFA;VENTOLIN HFA) 108 (90 BASE) MCG/ACT inhaler Inhale 2 puffs into the lungs every 6 (six) hours as needed for wheezing. 01/07/13  Yes Shanker Levora Dredge, MD  allopurinol (ZYLOPRIM) 300 MG tablet Take 300 mg by mouth every evening.   Yes Historical Provider, MD  hydrochlorothiazide (MICROZIDE) 12.5 MG capsule Take 12.5 mg by mouth at bedtime.   Yes Historical Provider, MD  omeprazole (PRILOSEC OTC) 20 MG tablet Take 20 mg by mouth 2 (two) times daily.   Yes Historical Provider, MD  oxyCODONE (OXYCONTIN) 10 MG 12 hr tablet Take 10 mg by mouth every 6 (six) hours as needed for pain.   Yes Historical Provider, MD  Probiotic Product (PROBIOTIC PO) Take 1 capsule by mouth daily.   Yes Historical Provider, MD   Physical Exam: Filed Vitals:   04/18/13 1725  BP: 129/93  Pulse: 93  Temp: 97.9 F (36.6 C)  Resp: 18   General Appearance:    Alert, cooperative, no distress, appears stated age  Head:    Normocephalic, without obvious abnormality, atraumatic  Eyes:    PERRL, conjunctiva/corneas clear, EOM's intact,  Ears:    Normal TM's and external ear canals, both ears  Nose:   Nares normal, septum midline, mucosa normal, no drainage    or sinus tenderness  Throat:   Lips, mucosa, and tongue normal; teeth and gums normal  Neck:   Supple, symmetrical, trachea midline, no adenopathy;       thyroid:  No enlargement/tenderness/nodules; no carotid   bruit or JVD  Back:     Symmetric, no curvature, ROM normal, no CVA tenderness  Lungs:     Clear to auscultation bilaterally, respirations unlabored  Chest wall:    No tenderness or deformity  Heart:     Regular rate and rhythm, S1 and S2 normal, no murmur, rub   or gallop  Abdomen:     Soft, non-tender, bowel sounds active all four quadrants,    no masses, no organomegaly        Extremities:   Left knee with swelling, no redness, warm to palpation, left ankle with swelling.   Pulses:   2+ and symmetric all extremities  Skin:   Skin color, texture, turgor normal, no rashes or lesions  Lymph nodes:   Cervical, supraclavicular, and axillary nodes normal  Neurologic:   CNII-XII intact. Normal strength, sensation and reflexes      throughout    Labs on Admission:  Basic Metabolic Panel:  Recent Labs Lab 04/18/13 1625  NA 136  K 4.5  CL 95*  CO2 28  GLUCOSE 82  BUN 4*  CREATININE 0.60  CALCIUM 10.4   Liver Function Tests: No results found for this basename: AST, ALT, ALKPHOS, BILITOT, PROT, ALBUMIN,  in the last 168 hours No results found for this basename: LIPASE, AMYLASE,  in the last 168 hours No results found for this basename: AMMONIA,  in the last 168 hours CBC:  Recent Labs Lab 04/18/13 1625  WBC 15.1*  NEUTROABS 12.0*  HGB 15.8  HCT 43.6  MCV 89.0  PLT 358   Cardiac Enzymes: No results found for this basename: CKTOTAL, CKMB, CKMBINDEX, TROPONINI,  in the last 168 hours  BNP (last 3 results) No results found for this basename: PROBNP,  in the last 8760 hours CBG: No results found for this basename: GLUCAP,  in the last 168 hours  Radiological Exams on Admission: Dg Lumbar Spine Complete  04/18/2013   CLINICAL DATA:  Pain  EXAM: LUMBAR SPINE - COMPLETE 4+ VIEW  COMPARISON:  February 26, 2013 CT abdomen and pelvis with reformats  FINDINGS: Frontal, lateral, spot lumbosacral lateral, and bilateral oblique views were obtained. There are 5 non-rib-bearing lumbar type vertebral bodies. There is mild levoscoliosis. There is mild anterior wedging of the T11 vertebral body which appears new compared to prior study. No other fracture. No spondylolisthesis. Disk spaces appear  intact. There is no appreciable facet arthropathy.  IMPRESSION: The mild anterior wedging of the T11 vertebral body. No retropulsion is seen in this area. No other evidence of fracture. No spondylolisthesis. No appreciable arthropathy. Mild scoliosis.   Electronically Signed   By: Bretta Bang   On: 04/18/2013 16:31   Dg Hip Complete Left  04/18/2013   CLINICAL DATA:  Pain  EXAM: LEFT HIP - COMPLETE 2+ VIEW  COMPARISON:  CT abdomen and pelvis with coronal reformats February 26, 2013  FINDINGS: Frontal pelvis as well as frontal and lateral left hip images were obtained. Moderate osteoarthritic change is stable in both hip joints. No acute fracture or dislocation. No erosive change.  IMPRESSION: Moderate symmetric  osteoarthritic change in both hip joints. No fracture or dislocation. Stable appearance compared to prior study.   Electronically Signed   By: Bretta Bang   On: 04/18/2013 16:28   Dg Ankle Complete Left  04/18/2013   CLINICAL DATA:  Pain  EXAM: LEFT ANKLE COMPLETE - 3+ VIEW  COMPARISON:  None.  FINDINGS: Frontal, oblique, and lateral views were obtained. There is no fracture or effusion. Ankle mortise appears intact. No erosive change.  IMPRESSION: No abnormality noted.   Electronically Signed   By: Bretta Bang   On: 04/18/2013 16:26   Dg Knee Complete 4 Views Left  04/18/2013   CLINICAL DATA:  Pain  EXAM: LEFT KNEE - COMPLETE 4+ VIEW  COMPARISON:  None.  FINDINGS: Frontal, lateral, and bilateral oblique views were obtained. There is no fracture or dislocation. No appreciable effusion. Joint spaces appear intact. No erosive change.  IMPRESSION: No fracture or effusion. No appreciable arthropathic change.   Electronically Signed   By: Bretta Bang   On: 04/18/2013 16:27    ZOX:WRUE available.   Assessment/Plan Principal Problem:   Gout Active Problems:   HTN (hypertension)   Abdominal pain, unspecified site  1-Asymmetric arthritis: With his prior history of gout , this  is highly in the differential. septic joint is less likely with normal ESR, no fever, no redness of joint. Reactive arthritis is a possibility due to prior history Gastrointestinal infection. Patient will need arthrocentesis, although no evidence  of joint effusion on x ray. Orthopoedic consulted.  I will start colchicine. Will stop HCTZ that could potentially make gout worse. I will give one time dose of IV steroids. Will need to be cautious with steroids due to his prior history of intraabdominal infection. I will start Vancomycin to cover for infection. If no evidence of infection will need to discontinue antibiotics as soon as possible to decrease risk for C diff. IV dilaudid PRN for pain.    2-The mild anterior wedging of the T11 vertebral body: Pain management. 3-Leukocytosis; could be reactive. Monitor.  4-HTN: Hold HCTZ , this can exacerbate gout.  5-Rectal Bleeding: post colonoscopy. Resolving. HB stable.   Code Status: presume Full Code.  Family Communication: Care discussed with wife.  Disposition Plan: admit under observation.   Time spent: 65 minutes.   Cejay Cambre [facility] Pager [phone]  If 7PM-7AM, please contact night-coverage [email or web address] Password [email or web address] 04/18/2013, 8:16 PM

## 2013-04-19 DIAGNOSIS — R109 Unspecified abdominal pain: Secondary | ICD-10-CM

## 2013-04-19 LAB — C-REACTIVE PROTEIN: CRP: 0.9 mg/dL — ABNORMAL HIGH (ref <0.60)

## 2013-04-19 LAB — HIV ANTIBODY (ROUTINE TESTING W REFLEX): HIV: NONREACTIVE

## 2013-04-19 MED ORDER — ALUM & MAG HYDROXIDE-SIMETH 200-200-20 MG/5ML PO SUSP
15.0000 mL | Freq: Four times a day (QID) | ORAL | Status: DC | PRN
  Administered 2013-04-19 – 2013-04-20 (×2): 15 mL via ORAL
  Filled 2013-04-19 (×2): qty 30

## 2013-04-19 MED ORDER — METHYLPREDNISOLONE SODIUM SUCC 125 MG IJ SOLR
80.0000 mg | Freq: Three times a day (TID) | INTRAMUSCULAR | Status: DC
  Administered 2013-04-19 – 2013-04-20 (×3): 80 mg via INTRAVENOUS
  Filled 2013-04-19 (×4): qty 1.28
  Filled 2013-04-19: qty 2
  Filled 2013-04-19: qty 1.28
  Filled 2013-04-19: qty 2
  Filled 2013-04-19: qty 1.28

## 2013-04-19 MED ORDER — DIAZEPAM 5 MG PO TABS
5.0000 mg | ORAL_TABLET | Freq: Once | ORAL | Status: AC
  Administered 2013-04-19: 5 mg via ORAL
  Filled 2013-04-19: qty 1

## 2013-04-19 MED ORDER — ALUM & MAG HYDROXIDE-SIMETH 200-200-20 MG/5ML PO SUSP
30.0000 mL | Freq: Once | ORAL | Status: AC
  Administered 2013-04-19: 30 mL via ORAL
  Filled 2013-04-19: qty 30

## 2013-04-19 NOTE — Consult Note (Signed)
Reason for Consult:  Left leg pain and the inability to bear weight Referring Physician:   [facility]  Preston [name] is an 42 y.o. male.  HPI:   42 yo male admitted last evening to the medical service sue to left leg pain and the inability to ambulate.  X-rays were negative for an acute process.  He is on chronic Percocet, but no NSAIDs prior to admission.  He was started on prednisone last evening and now reports significant improvement in his pain and he can now put weight on his left leg.  Past Medical History  Diagnosis Date  . Gout   . Hypertension   . Diverticulitis   . Asthma   . GERD (gastroesophageal reflux disease)     Past Surgical History  Procedure Laterality Date  . Appendectomy    . Mouth surgery  2010  . Flexible sigmoidoscopy N/A 02/28/2013    Procedure: FLEXIBLE SIGMOIDOSCOPY;  Surgeon: Vertell Novak., MD;  Location: [facility] ENDOSCOPY;  Service: Endoscopy;  Laterality: N/A;  . Colonoscopy with propofol N/A 04/01/2013    Procedure: COLONOSCOPY WITH PROPOFOL;  Surgeon: Willis Modena, MD;  Location: [facility] ENDOSCOPY;  Service: Endoscopy;  Laterality: N/A;    Family History  Problem Relation Age of Onset  . Cancer Father     Social History:  reports that he has been smoking Cigarettes.  He has a 29 pack-year smoking history. He has never used smokeless tobacco. He reports that he does not drink alcohol or use illicit drugs.  Allergies:  Allergies  Allergen Reactions  . Morphine And Related Itching, Nausea And Vomiting and Other (See Comments)    "bad feeling"    Medications: I have reviewed the patient's current medications.  Results for orders placed during the hospital encounter of 04/18/13 (from the past 48 hour(s))  CBC WITH DIFFERENTIAL     Status: Abnormal   Collection Time    04/18/13  4:25 PM      Result Value Range   WBC 15.1 (*) 4.0 - 10.5 K/uL   RBC 4.90  4.22 - 5.81 MIL/uL   Hemoglobin 15.8  13.0 - 17.0 g/dL   HCT 14.7  82.9 - 56.2 %   MCV 89.0  78.0 - 100.0 fL   MCH 32.2  26.0 - 34.0 pg   MCHC 36.2 (*) 30.0 - 36.0 g/dL   RDW 13.0  86.5 - 78.4 %   Platelets 358  150 - 400 K/uL   Neutrophils Relative % 80 (*) 43 - 77 %   Neutro Abs 12.0 (*) 1.7 - 7.7 K/uL   Lymphocytes Relative 13  12 - 46 %   Lymphs Abs 2.0  0.7 - 4.0 K/uL   Monocytes Relative 6  3 - 12 %   Monocytes Absolute 0.9  0.1 - 1.0 K/uL   Eosinophils Relative 1  0 - 5 %   Eosinophils Absolute 0.2  0.0 - 0.7 K/uL   Basophils Relative 0  0 - 1 %   Basophils Absolute 0.0  0.0 - 0.1 K/uL  BASIC METABOLIC PANEL     Status: Abnormal   Collection Time    04/18/13  4:25 PM      Result Value Range   Sodium 136  135 - 145 mEq/L   Potassium 4.5  3.5 - 5.1 mEq/L   Comment: HEMOLYSIS AT THIS LEVEL MAY AFFECT RESULT   Chloride 95 (*) 96 - 112 mEq/L   CO2 28  19 - 32 mEq/L  Glucose, Bld 82  70 - 99 mg/dL   BUN 4 (*) 6 - 23 mg/dL   Creatinine, Ser 1.61  0.50 - 1.35 mg/dL   Calcium 09.6  8.4 - 04.5 mg/dL   GFR calc non Af Amer >90  >90 mL/min   GFR calc Af Amer >90  >90 mL/min   Comment: (NOTE)     The eGFR has been calculated using the CKD EPI equation.     This calculation has not been validated in all clinical situations.     eGFR's persistently <90 mL/min signify possible Chronic Kidney     Disease.  URIC ACID     Status: None   Collection Time    04/18/13  4:25 PM      Result Value Range   Uric Acid, Serum 4.9  4.0 - 7.8 mg/dL  SEDIMENTATION RATE     Status: None   Collection Time    04/18/13  4:25 PM      Result Value Range   Sed Rate 13  0 - 16 mm/hr  C-REACTIVE PROTEIN     Status: Abnormal   Collection Time    04/18/13  4:25 PM      Result Value Range   CRP 0.9 (*) <0.60 mg/dL   Comment: Performed at [facility]  LACTIC ACID, PLASMA     Status: None   Collection Time    04/18/13  4:25 PM      Result Value Range   Lactic Acid, Venous 1.7  0.5 - 2.2 mmol/L  HIV ANTIBODY (ROUTINE TESTING)     Status: None   Collection Time     04/19/13  5:55 AM      Result Value Range   HIV NON REACTIVE  NON REACTIVE   Comment: Performed at [facility]    Dg Lumbar Spine Complete  04/18/2013   CLINICAL DATA:  Pain  EXAM: LUMBAR SPINE - COMPLETE 4+ VIEW  COMPARISON:  February 26, 2013 CT abdomen and pelvis with reformats  FINDINGS: Frontal, lateral, spot lumbosacral lateral, and bilateral oblique views were obtained. There are 5 non-rib-bearing lumbar type vertebral bodies. There is mild levoscoliosis. There is mild anterior wedging of the T11 vertebral body which appears new compared to prior study. No other fracture. No spondylolisthesis. Disk spaces appear intact. There is no appreciable facet arthropathy.  IMPRESSION: The mild anterior wedging of the T11 vertebral body. No retropulsion is seen in this area. No other evidence of fracture. No spondylolisthesis. No appreciable arthropathy. Mild scoliosis.   Electronically Signed   By: Bretta Bang   On: 04/18/2013 16:31   Dg Hip Complete Left  04/18/2013   CLINICAL DATA:  Pain  EXAM: LEFT HIP - COMPLETE 2+ VIEW  COMPARISON:  CT abdomen and pelvis with coronal reformats February 26, 2013  FINDINGS: Frontal pelvis as well as frontal and lateral left hip images were obtained. Moderate osteoarthritic change is stable in both hip joints. No acute fracture or dislocation. No erosive change.  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 No fracture or dislocation. Stable appearance compared to prior study.   Electronically Signed   By: Bretta Bang   On: 04/18/2013 16:28   Dg Ankle Complete Left  04/18/2013   CLINICAL DATA:  Pain  EXAM: LEFT ANKLE COMPLETE - 3+ VIEW  COMPARISON:  None.  FINDINGS: Frontal, oblique, and lateral views were obtained. There is no fracture or effusion. Ankle mortise appears intact. No erosive change.  IMPRESSION: No abnormality noted.   Electronically Signed   By: Bretta Bang   On: 04/18/2013 16:26   Dg Knee Complete 4 Views  Left  04/18/2013   CLINICAL DATA:  Pain  EXAM: LEFT KNEE - COMPLETE 4+ VIEW  COMPARISON:  None.  FINDINGS: Frontal, lateral, and bilateral oblique views were obtained. There is no fracture or dislocation. No appreciable effusion. Joint spaces appear intact. No erosive change.  IMPRESSION: No fracture or effusion. No appreciable arthropathic change.   Electronically Signed   By: Bretta Bang   On: 04/18/2013 16:27    ROS Blood pressure 124/80, pulse 98, temperature 98.1 F (36.7 C), temperature source Oral, resp. rate 18, height 6' 2.4" (1.89 m), weight 110.9 kg (244 lb 7.8 oz), SpO2 96.00%. Physical Exam  Musculoskeletal:       Left hip: He exhibits decreased range of motion and decreased strength.       Left knee: Tenderness found. Medial joint line tenderness noted.       Left ankle: Normal.    Assessment/Plan: Multiple joint complaints left leg that has improved greatly on prednisone - likely an inflammatory process such as gout.  Exam basically benign at this point. 1)  He likely needs to remain on NSAID's for a week or two.  Would try Celebrex or Indocin or even a 6 day medrol dose pack taper.  He can WBAT.  I gave him my card and let him know he can follow-up prn.  Kathryne Hitch 04/19/2013, 6:38 PM

## 2013-04-19 NOTE — ED Provider Notes (Signed)
Medical screening examination/treatment/procedure(s) were performed by non-physician practitioner and as supervising physician I was immediately available for consultation/collaboration.   Vallie Fayette B. Olukemi Panchal, MD 04/19/13 1332 

## 2013-04-19 NOTE — Progress Notes (Signed)
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 -Started empirically yesterday on steroids and his home allopurinol and colchicine continued. -Feels better today, things because of steroids. -Also started on antibiotics till intra-articular infection is ruled out. -I will discontinue antibiotics as there is no fever, low ESR and no redness.  Rectal bleeding -Plus colonoscopy, likely small mucosal tear, this is resolving and his hemoglobin is stable.  Hypertension -Is on HCTZ, this can exacerbate, hold for now.  Leukocytosis -Likely stress demargination.  Code Status: Full code Family Communication: Plan discussed with the patient. Disposition Plan: Remains inpatient   Consultants:  None  Procedures:  None  Antibiotics:  Vancomycin   Objective: Filed Vitals:   04/19/13 1021  BP: 138/89  Pulse: 98  Temp: 98.3 F (36.8 C)  Resp: 18    Intake/Output Summary (Last 24 hours) at 04/19/13 1321 Last data filed at 04/19/13 1023  Gross per 24 hour  Intake   1215 ml  Output   1000 ml  Net    215 ml   Filed Weights   04/18/13 2146 04/19/13 0534  Weight: 110.5 kg (243 lb 9.7 oz) 110.9 kg (244 lb 7.8 oz)    Exam: General: Alert and awake, oriented x3, not in any acute distress. HEENT: anicteric sclera, pupils reactive to light and accommodation, EOMI CVS: S1-S2 clear, no murmur rubs or gallops Chest: clear to auscultation bilaterally, no wheezing, rales or rhonchi Abdomen: soft nontender, nondistended, normal bowel sounds, no organomegaly Extremities: no cyanosis, clubbing or edema noted bilaterally Neuro:  Cranial nerves II-XII intact, no focal neurological deficits  Data Reviewed: Basic Metabolic Panel:  Recent Labs Lab 04/18/13 1625  NA 136  K 4.5  CL 95*  CO2 28  GLUCOSE 82  BUN 4*  CREATININE 0.60  CALCIUM 10.4   Liver Function Tests: No results found for this basename: AST, ALT, ALKPHOS, BILITOT, PROT, ALBUMIN,  in the last 168 hours No results found for this basename: LIPASE, AMYLASE,  in the last 168 hours No results found for this basename: AMMONIA,  in the last 168 hours CBC:  Recent Labs Lab 04/18/13 1625  WBC 15.1*  NEUTROABS 12.0*  HGB 15.8  HCT 43.6  MCV 89.0  PLT 358   Cardiac Enzymes: No results found for this basename: CKTOTAL, CKMB, CKMBINDEX, TROPONINI,  in the last 168 hours BNP (last 3 results) No results found for this basename: PROBNP,  in the last 8760 hours CBG: No results found for this basename: GLUCAP,  in the last 168 hours  Micro No results found for this or any previous visit (from the past 240 hour(s)).   Studies: Dg Lumbar Spine Complete  04/18/2013   CLINICAL DATA:  Pain  EXAM: LUMBAR SPINE - COMPLETE 4+ VIEW  COMPARISON:  February 26, 2013 CT abdomen and pelvis with reformats  FINDINGS: Frontal, lateral, spot lumbosacral lateral, and bilateral oblique views were obtained. There are 5 non-rib-bearing lumbar type vertebral bodies. There is mild levoscoliosis. There is mild anterior wedging of the T11 vertebral body which appears new compared to prior study. No other fracture. No spondylolisthesis. Disk spaces appear intact. There is no appreciable facet arthropathy.  IMPRESSION: The  mild anterior wedging of the T11 vertebral body. No retropulsion is seen in this area. No other evidence of fracture. No spondylolisthesis. No appreciable arthropathy. Mild scoliosis.   Electronically Signed   By: Bretta Bang   On: 04/18/2013 16:31   Dg Hip Complete Left  04/18/2013   CLINICAL DATA:  Pain  EXAM: LEFT HIP - COMPLETE 2+ VIEW  COMPARISON:  CT  abdomen and pelvis with coronal reformats February 26, 2013  FINDINGS: Frontal pelvis as well as frontal and lateral left hip images were obtained. Moderate osteoarthritic change is stable in both hip joints. No acute fracture or dislocation. No erosive change.  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 No fracture or dislocation. Stable appearance compared to prior study.   Electronically Signed   By: Bretta Bang   On: 04/18/2013 16:28   Dg Ankle Complete Left  04/18/2013   CLINICAL DATA:  Pain  EXAM: LEFT ANKLE COMPLETE - 3+ VIEW  COMPARISON:  None.  FINDINGS: Frontal, oblique, and lateral views were obtained. There is no fracture or effusion. Ankle mortise appears intact. No erosive change.  IMPRESSION: No abnormality noted.   Electronically Signed   By: Bretta Bang   On: 04/18/2013 16:26   Dg Knee Complete 4 Views Left  04/18/2013   CLINICAL DATA:  Pain  EXAM: LEFT KNEE - COMPLETE 4+ VIEW  COMPARISON:  None.  FINDINGS: Frontal, lateral, and bilateral oblique views were obtained. There is no fracture or dislocation. No appreciable effusion. Joint spaces appear intact. No erosive change.  IMPRESSION: No fracture or effusion. No appreciable arthropathic change.   Electronically Signed   By: Bretta Bang   On: 04/18/2013 16:27    Scheduled Meds: . acidophilus  1 capsule Oral Daily  . allopurinol  300 mg Oral QPM  . colchicine  0.6 mg Oral Daily  . docusate sodium  100 mg Oral BID  . enoxaparin (LOVENOX) injection  40 mg Subcutaneous Q24H  . pantoprazole  40 mg Oral Daily  . vancomycin  1,000 mg Intravenous Q12H   Continuous Infusions: . sodium chloride 75 mL/hr at 04/19/13 0353       Time spent: 35 minutes    [facility] A  [facility] Pager [phone] If 7PM-7AM, please contact night-coverage at [email or web address], password [facility] 04/19/2013, 1:21 PM  LOS: 1 day

## 2013-04-20 DIAGNOSIS — K5730 Diverticulosis of large intestine without perforation or abscess without bleeding: Secondary | ICD-10-CM

## 2013-04-20 LAB — BASIC METABOLIC PANEL
BUN: 8 mg/dL (ref 6–23)
CO2: 25 mEq/L (ref 19–32)
Calcium: 9.7 mg/dL (ref 8.4–10.5)
Chloride: 100 mEq/L (ref 96–112)
Creatinine, Ser: 0.46 mg/dL — ABNORMAL LOW (ref 0.50–1.35)
GFR calc Af Amer: >90 mL/min (ref >90)

## 2013-04-20 LAB — ROCKY MTN SPOTTED FVR AB, IGM-BLOOD: RMSF IgM: 0.42 IV (ref 0.00–0.89)

## 2013-04-20 LAB — CBC
HCT: 41.1 % (ref 39.0–52.0)
MCHC: 34.3 g/dL (ref 30.0–36.0)
MCV: 89.2 fL (ref 78.0–100.0)
Platelets: 369 10*3/uL (ref 150–400)
RDW: 12.2 % (ref 11.5–15.5)
WBC: 19.4 10*3/uL — ABNORMAL HIGH (ref 4.0–10.5)

## 2013-04-20 MED ORDER — COLCHICINE 0.6 MG PO TABS: 0.6000 mg | ORAL_TABLET | Freq: Every day | ORAL | Status: DC

## 2013-04-20 MED ORDER — INDOMETHACIN 50 MG PO CAPS: 50.0000 mg | ORAL_CAPSULE | Freq: Three times a day (TID) | ORAL | Status: AC

## 2013-04-20 MED ORDER — PREDNISONE (PAK) 10 MG PO TABS: 10.0000 mg | ORAL_TABLET | Freq: Every day | ORAL | Status: DC

## 2013-04-20 NOTE — Care Management Note (Signed)
    Page 1 of 1   04/20/2013     12:39:47 PM   CARE MANAGEMENT NOTE 04/20/2013  Patient:  Preston [name], Preston [name]   Account Number:  [id number]  Date Initiated:  04/20/2013  Documentation initiated by:  Letha Cape  Subjective/Objective Assessment:   dx gout  admit-lives with spouse.  pta indep.     Action/Plan:   Anticipated DC Date:  04/20/2013   Anticipated DC Plan:  HOME/SELF CARE      DC Planning Services  CM consult      Choice offered to / List presented to:             Status of service:  Completed, signed off Medicare Important Message given?   (If response is "NO", the following Medicare IM given date fields will be blank) Date Medicare IM given:   Date Additional Medicare IM given:    Discharge Disposition:  HOME/SELF CARE  Per UR Regulation:  Reviewed for med. necessity/level of care/duration of stay  If discussed at Long Length of Stay Meetings, dates discussed:    Comments:  04/20/13 12:38 Letha Cape RN, BSN [phone] patient lives with spouse, pta indep. Patient has medication coverage and transportation at dc.  No needs anticipated.

## 2013-04-20 NOTE — Discharge Summary (Signed)
Physician Discharge Summary  [name] ZOX:[id number] DOB: 1971/06/07 DOA: 04/18/2013  PCP: Jeanann Lewandowsky, MD  Admit date: 04/18/2013 Discharge date: 04/20/2013  Time spent: 40 minutes  Recommendations for Outpatient Follow-up:  1. Begin Prednisone taper upon discharge. 2. Continue Allopurinol and Colchicine at home regimen.  3. Treat pain with at home Oxycodone as needed or use Indomethacin as needed if acid reflux symptoms subside.   Discharge Diagnoses:  Principal Problem:   Gout Active Problems:   HTN (hypertension)   Abdominal pain, unspecified site   Discharge Condition: Stable  Diet recommendation: Heart-healthy diet  Filed Weights   04/18/13 2146 04/19/13 0534 04/20/13 0512  Weight: 110.5 kg (243 lb 9.7 oz) 110.9 kg (244 lb 7.8 oz) 111.267 kg (245 lb 4.8 oz)    History of present illness at admission:   Preston [name] is a 42 y.o. male with a past medical history significant for recurrent diverticulitis, C diff colitis, hypertension and gout who presented to the ED complaining of worsening left hip, knee and ankle pain and swelling over the last 3 days. The pain originally began 2 weeks ago and he has not been able to walk due to the severe pain. He has been using crutches, but now he is not able to walk even with the crutches. He denies fever, chill, nausea, vomiting. He reports that he took 5 doses of colchicine for two days without any relief.  Of note, he relates rectal bleeding is better. He started to have rectal bleeding after he had a recent colonoscopy. His gastroenterologist thinks the bleeding is from a small mucosa tear during the colonoscopy procedure. He has chronic abdominal pain due to his history of chronic diverticulitis. Abdominal pain is stable not worse. He is having regular bowel movements.   Hospital Course:   Polyarthritis  Likely acute gouty arthritis  Left hip, knee, and ankle pain  Empiric steroids started on admission with  continuation of home allopurinol and colchicine   Improvement of pain with steroid implementation  Antibiotics started until intra-articular infection was ruled out  Vancomycin discontinued due to patient remaining afebrile, low ESR, and no redness  Improving ambulation and decreasing pain  Steroid taper with home allopurinol and colchicine  Home oxycodone prn or indomethacin when acid reflux symptoms aren't present  Rectal bleeding  S/P colonoscopy with small mucosal tear  Hemodynamically stable  Hypertension  Managed with HCTZ  Held due to possible gouty exacerbation  HCTZ resumed upon discharge, patient to followup with primary care physician for her other hypertension medication alternatives, HCTZ is related to increased gout attacks.  Leukocytosis  Probable stress response vs. Prednisone  Patient has remained stable and afebrile, no further workup.  Procedures:  None  Consultations:  Orthopedics  Antibiotics:  Vancomycin: started 04/19/13, stopped 04/19/13  Discharge Exam: Filed Vitals:   04/20/13 0512  BP: 130/77  Pulse: 86  Temp: 97.8 F (36.6 C)  Resp: 17    General: Well-developed, well-nourished, Caucasian male, in no acute distress Cardiovascular: RRR, no rubs, gallops, or murmurs Respiratory: CTA bilaterally, no wheezes, rhonchi, or rales  Abdomen: soft, non-tender, non-distended, bowel sounds present Musculoskeletal: no edema, no redness of the left lower extremity, mild tenderness to palpation left knee, slightly decreased ROM, minimal left knee swelling   Discharge Instructions   Future Appointments Provider Department Dept Phone   04/30/2013 8:45 AM Ernestene Mention, MD [facility] [phone]       Medication List    ASK your  doctor about these medications       albuterol 108 (90 BASE) MCG/ACT inhaler  Commonly known as:  PROVENTIL HFA;VENTOLIN HFA  Inhale 2 puffs into the lungs every 6 (six) hours as needed  for wheezing.     allopurinol 300 MG tablet  Commonly known as:  ZYLOPRIM  Take 300 mg by mouth every evening.     hydrochlorothiazide 12.5 MG capsule  Commonly known as:  MICROZIDE  Take 12.5 mg by mouth at bedtime.     omeprazole 20 MG tablet  Commonly known as:  PRILOSEC OTC  Take 20 mg by mouth 2 (two) times daily.     oxyCODONE 10 MG 12 hr tablet  Commonly known as:  OXYCONTIN  Take 10 mg by mouth every 6 (six) hours as needed for pain.     PROBIOTIC PO  Take 1 capsule by mouth daily.       Allergies  Allergen Reactions  . Morphine And Related Itching, Nausea And Vomiting and Other (See Comments)    "bad feeling"      The results of significant diagnostics from this hospitalization (including imaging, microbiology, ancillary and laboratory) are listed below for reference.    Significant Diagnostic Studies: Dg Lumbar Spine Complete  04/18/2013   CLINICAL DATA:  Pain  EXAM: LUMBAR SPINE - COMPLETE 4+ VIEW  COMPARISON:  February 26, 2013 CT abdomen and pelvis with reformats  FINDINGS: Frontal, lateral, spot lumbosacral lateral, and bilateral oblique views were obtained. There are 5 non-rib-bearing lumbar type vertebral bodies. There is mild levoscoliosis. There is mild anterior wedging of the T11 vertebral body which appears new compared to prior study. No other fracture. No spondylolisthesis. Disk spaces appear intact. There is no appreciable facet arthropathy.  IMPRESSION: The mild anterior wedging of the T11 vertebral body. No retropulsion is seen in this area. No other evidence of fracture. No spondylolisthesis. No appreciable arthropathy. Mild scoliosis.   Electronically Signed   By: Bretta Bang   On: 04/18/2013 16:31   Dg Hip Complete Left  04/18/2013   CLINICAL DATA:  Pain  EXAM: LEFT HIP - COMPLETE 2+ VIEW  COMPARISON:  CT abdomen and pelvis with coronal reformats February 26, 2013  FINDINGS: Frontal pelvis as well as frontal and lateral left hip images were obtained.  Moderate osteoarthritic change is stable in both hip joints. No acute fracture or dislocation. No erosive change.  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 No fracture or dislocation. Stable appearance compared to prior study.   Electronically Signed   By: Bretta Bang   On: 04/18/2013 16:28   Dg Ankle Complete Left  04/18/2013   CLINICAL DATA:  Pain  EXAM: LEFT ANKLE COMPLETE - 3+ VIEW  COMPARISON:  None.  FINDINGS: Frontal, oblique, and lateral views were obtained. There is no fracture or effusion. Ankle mortise appears intact. No erosive change.  IMPRESSION: No abnormality noted.   Electronically Signed   By: Bretta Bang   On: 04/18/2013 16:26   Dg Knee Complete 4 Views Left  04/18/2013   CLINICAL DATA:  Pain  EXAM: LEFT KNEE - COMPLETE 4+ VIEW  COMPARISON:  None.  FINDINGS: Frontal, lateral, and bilateral oblique views were obtained. There is no fracture or dislocation. No appreciable effusion. Joint spaces appear intact. No erosive change.  IMPRESSION: No fracture or effusion. No appreciable arthropathic change.   Electronically Signed   By: Bretta Bang   On: 04/18/2013 16:27    Labs: Basic Metabolic  Panel:  Recent Labs Lab 04/18/13 1625 04/20/13 0634  NA 136 136  K 4.5 3.8  CL 95* 100  CO2 28 25  GLUCOSE 82 145*  BUN 4* 8  CREATININE 0.60 0.46*  CALCIUM 10.4 9.7   CBC:  Recent Labs Lab 04/18/13 1625 04/20/13 0634  WBC 15.1* 19.4*  NEUTROABS 12.0*  --   HGB 15.8 14.1  HCT 43.6 41.1  MCV 89.0 89.2  PLT 358 369      Signed:  DENNIN, SARA A PA-S  [facility] 04/20/2013, 10:20 AM

## 2013-04-20 NOTE — Progress Notes (Signed)
Patient discharge teaching given, including activity, diet, follow-up appoints, and medications. Patient verbalized understanding of all discharge instructions. IV access was d/c'd. Vitals are stable. Skin is intact except as charted in most recent assessments. Pt to be escorted out by RN, to be driven home by family.  

## 2013-04-20 NOTE — Telephone Encounter (Signed)
Barium enema test cancelled with GI at this time.  Patient is currently inpatient at [facility] hospital.  CBC done at hospital.  Patient is already setup for PO appt next Thursday 04/30/13.

## 2013-04-24 ENCOUNTER — Other Ambulatory Visit: Payer: Medicaid Other

## 2013-04-28 ENCOUNTER — Telehealth (INDEPENDENT_AMBULATORY_CARE_PROVIDER_SITE_OTHER): Payer: Self-pay

## 2013-04-28 ENCOUNTER — Other Ambulatory Visit (INDEPENDENT_AMBULATORY_CARE_PROVIDER_SITE_OTHER): Payer: Self-pay

## 2013-04-28 DIAGNOSIS — K5792 Diverticulitis of intestine, part unspecified, without perforation or abscess without bleeding: Secondary | ICD-10-CM

## 2013-04-28 NOTE — Telephone Encounter (Signed)
I left a message for the pt to call me.  I need to see if he can get a CBC done tomorrow before he comes in Thursday.  I will put an order in at [facility].

## 2013-04-28 NOTE — Telephone Encounter (Signed)
Patient called back and states he will go to [facility] tomorrow to get a blood draw.

## 2013-04-29 LAB — CBC WITH DIFFERENTIAL/PLATELET
Basophils Absolute: 0 10*3/uL (ref 0.0–0.1)
HCT: 45.5 % (ref 39.0–52.0)
Hemoglobin: 16.1 g/dL (ref 13.0–17.0)
Lymphocytes Relative: 16 % (ref 12–46)
Monocytes Absolute: 0.8 10*3/uL (ref 0.1–1.0)
Neutro Abs: 11.9 10*3/uL — ABNORMAL HIGH (ref 1.7–7.7)
Neutrophils Relative %: 78 % — ABNORMAL HIGH (ref 43–77)
RDW: 12.9 % (ref 11.5–15.5)
WBC: 15.5 10*3/uL — ABNORMAL HIGH (ref 4.0–10.5)

## 2013-04-30 ENCOUNTER — Other Ambulatory Visit (INDEPENDENT_AMBULATORY_CARE_PROVIDER_SITE_OTHER): Payer: Self-pay

## 2013-04-30 ENCOUNTER — Encounter (INDEPENDENT_AMBULATORY_CARE_PROVIDER_SITE_OTHER): Payer: Self-pay | Admitting: General Surgery

## 2013-04-30 ENCOUNTER — Telehealth (INDEPENDENT_AMBULATORY_CARE_PROVIDER_SITE_OTHER): Payer: Self-pay | Admitting: *Deleted

## 2013-04-30 ENCOUNTER — Ambulatory Visit (INDEPENDENT_AMBULATORY_CARE_PROVIDER_SITE_OTHER): Payer: Medicaid Other | Admitting: General Surgery

## 2013-04-30 VITALS — BP 150/98 | HR 88 | Temp 97.7°F | Resp 14 | Ht 75.0 in | Wt 248.4 lb

## 2013-04-30 DIAGNOSIS — K5792 Diverticulitis of intestine, part unspecified, without perforation or abscess without bleeding: Secondary | ICD-10-CM

## 2013-04-30 DIAGNOSIS — M109 Gout, unspecified: Secondary | ICD-10-CM

## 2013-04-30 DIAGNOSIS — K5732 Diverticulitis of large intestine without perforation or abscess without bleeding: Secondary | ICD-10-CM

## 2013-04-30 DIAGNOSIS — A0472 Enterocolitis due to Clostridium difficile, not specified as recurrent: Secondary | ICD-10-CM

## 2013-04-30 NOTE — Telephone Encounter (Signed)
LMOM for pt to return my call.  I was calling to inform pt of his appt at [facility] on 05/14/13 at 9:30am (arrive at 9:15) for his CT scan.    -No solid food 4 hours prior to scan. -Drink 1st bottle of contrast at 7:30 -Drink 2nd bottle of contrast at 8:30

## 2013-04-30 NOTE — Progress Notes (Signed)
Patient ID: Preston [name], male   DOB: 07-Dec-1970, 42 y.o.   MRN: [id number]  Chief Complaint  Patient presents with  . Routine Post Op    f/u diverticulitis after BE    HPI Preston [name] is a 42 y.o. male.  He returns with his wife for further discussion of his abdominal pain and presumed colitis.  Since I last saw him, he was hospitalized Sept. 20 for 3 days with a severe gout flare. He was put on high-dose prednisone which has been tapered and he took his last dose 2 days ago.  From a GI standpoint, he has 3 or 4 soft bowel movements in the morning, notices left lower quadrant discomfort and then after that he's feeling fine. Doesn't really have much pain during the day he is still taking some Vicodin from time to time. His wife say he says he has intermittent fevers but no chills. He is afebrile today and in no distress.  I've discussed his care with Dr. Dulce Sellar. Neither of [place] is certain as to the cause of his sigmoid colitis. If he continues to have pain, he will obviously need to have an operation at some point in time. Today, however, he feels fine. WBC has been fluctuating. 10,500 in August. 15,000 on September 20. 19,000 on September 22. 15,000 September 30.  Colonoscopy performed by Dr. Dulce Sellar  did not look that bad. There was no obstruction. No diverticular were seen. There was no stricture. No malignancy. No foreign body.There was a little bit of edema of the sigmoid. 2 descending colon polyps were removed .[name] Colonoscopy went all the way to the cecum. After his colonoscopy he had some rectal bleeding and rectal pain consistent with an acute fissure. That is getting better. He is on topical therapy prescribed by Dr. Dulce Sellar.  Previously ,his patient was initially hospitalized in April for acute sigmoid diverticulitis, possible perforation with small 2.7 cm abscess. He improved but the pain never completely went away in the left lower quadrant. He was hospitalized again from June 2-5,  2014. CT scan showed that he had acute diverticulitis and the abscess had essentially resolved. He was discharged once again. He was hospitalized from February 26, 2013 through 03/01/2013 and was diagnosed at with C. Difficile colitis. He was discharged home on Cipro and Flagyl and is still taking that. He's feeling the best he has only says he still has some pain and is worried that he'll have another flareup.[name] He is eating normal food. Trying to avoid seeds and nuts as he was advised. He has lost 5-7 pounds in the past 5 months. Basically eating okay.  Dr. Randa Evens performed a flexible sigmoidoscopy on August 2 while he was in the hospital. It showed minimally inflamed mucosa, no pseudomembranes despite PCR positive C.Diff. This was at 25-35 cm. There were only a few diverticula. No pseudomembranes were seen. He noted a polyp in the descending colon.. CT scan during that hospitalization showed inflamed colon but no abscess. Biopsy of the colon showed minimal colitis. Subsequent total colonoscopy as discussed above.   Past history is significant for hypertension, obesity, current tobacco use, appendectomy. Family history is significant for father dying of cancer, sounds like a metastatic lymphoma. There is no family history of colon cancer.  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 Used to smoke 3 packs a day. Drinks alcohol but try to taper off. 2 children age 78 and 36  HPI  Past Medical History  Diagnosis Date  . Gout   . Hypertension   . Diverticulitis   . Asthma   . GERD (gastroesophageal reflux disease)     Past Surgical History  Procedure Laterality Date  . Appendectomy    . Mouth surgery  2010  . Flexible sigmoidoscopy N/A 02/28/2013    Procedure: FLEXIBLE SIGMOIDOSCOPY;  Surgeon: Vertell Novak., MD;  Location: [facility] ENDOSCOPY;  Service: Endoscopy;  Laterality: N/A;  . Colonoscopy with propofol N/A 04/01/2013    Procedure: COLONOSCOPY WITH PROPOFOL;  Surgeon:  Willis Modena, MD;  Location: [facility] ENDOSCOPY;  Service: Endoscopy;  Laterality: N/A;    Family History  Problem Relation Age of Onset  . Cancer Father     Social History History  Substance Use Topics  . Smoking status: Current Every Day Smoker -- 1.00 packs/day for 29 years    Types: Cigarettes  . Smokeless tobacco: Never Used  . Alcohol Use: No     Comment: drank everyday until 6 months ago and none for 1 month    Allergies  Allergen Reactions  . Morphine And Related Itching, Nausea And Vomiting and Other (See Comments)    "bad feeling"    Current Outpatient Prescriptions  Medication Sig Dispense Refill  . albuterol (PROVENTIL HFA;VENTOLIN HFA) 108 (90 BASE) MCG/ACT inhaler Inhale 2 puffs into the lungs every 6 (six) hours as needed for wheezing.      [name] allopurinol (ZYLOPRIM) 300 MG tablet Take 300 mg by mouth every evening.      . colchicine 0.6 MG tablet Take 1 tablet (0.6 mg total) by mouth daily.  30 tablet  0  . hydrochlorothiazide (MICROZIDE) 12.5 MG capsule Take 12.5 mg by mouth at bedtime.      [name] omeprazole (PRILOSEC OTC) 20 MG tablet Take 20 mg by mouth 2 (two) times daily.      [name] oxyCODONE (OXYCONTIN) 10 MG 12 hr tablet Take 10 mg by mouth every 6 (six) hours as needed for pain.      . predniSONE (STERAPRED UNI-PAK) 10 MG tablet Take 1 tablet (10 mg total) by mouth daily. Take 6-5-4-3-2-1 tablets PO till gone  21 tablet  0  . Probiotic Product (PROBIOTIC PO) Take 1 capsule by mouth daily.       No current facility-administered medications for this visit.    Review of Systems Review of Systems  Constitutional: Negative for fever, chills and unexpected weight change.  HENT: Negative for hearing loss, congestion, sore throat, trouble swallowing and voice change.   Eyes: Negative for visual disturbance.  Respiratory: Negative for cough and wheezing.   Cardiovascular: Negative for chest pain, palpitations and leg swelling.  Gastrointestinal: Positive for abdominal  pain and rectal pain. Negative for nausea, vomiting, diarrhea, constipation, blood in stool, abdominal distention and anal bleeding.  Genitourinary: Negative for hematuria and difficulty urinating.  Musculoskeletal: Negative for arthralgias.  Skin: Negative for rash and wound.  Neurological: Negative for seizures, syncope, weakness and headaches.  Hematological: Negative for adenopathy. Does not bruise/bleed easily.  Psychiatric/Behavioral: Negative for confusion.    Blood pressure 150/98, pulse 88, temperature 97.7 F (36.5 C), temperature source Temporal, resp. rate 14, height 6\' 3"  (1.905 m), weight 248 lb 6.4 oz (112.674 kg).  Physical Exam Physical Exam  Constitutional: He is oriented to person, place, and time. He appears well-developed and well-nourished. No distress.  HENT:  Head: Normocephalic.  Nose: Nose normal.  Mouth/Throat: No oropharyngeal exudate.  Eyes: Conjunctivae and EOM are  normal. Pupils are equal, round, and reactive to light. Right eye exhibits no discharge. Left eye exhibits no discharge. No scleral icterus.  Neck: Normal range of motion. Neck supple. No JVD present. No tracheal deviation present. No thyromegaly present.  Cardiovascular: Normal rate, regular rhythm, normal heart sounds and intact distal pulses.   No murmur heard. Pulmonary/Chest: Effort normal and breath sounds normal. No stridor. No respiratory distress. He has no wheezes. He has no rales. He exhibits no tenderness.  Abdominal: Soft. Bowel sounds are normal. He exhibits no distension and no mass. There is no tenderness. There is no rebound and no guarding.  Abdomen remains obese. Quite soft. Nondistended. Subjectively tender left suprapubic area. No mass. No guarding.  Musculoskeletal: Normal range of motion. He exhibits no edema and no tenderness.  Lymphadenopathy:    He has no cervical adenopathy.  Neurological: He is alert and oriented to person, place, and time. He has normal reflexes.  Coordination normal.  Skin: Skin is warm and dry. No rash noted. He is not diaphoretic. No erythema. No pallor.  Psychiatric: He has a normal mood and affect. His behavior is normal. Judgment and thought content normal.    Data Reviewed Hospital records. Mild records. Discussion with Dr. Dulce Sellar.  Assessment    Presumed, but not confirmed recurrent diverticulitis with superimposed C. Difficile colitis. 4 hospitalizations this year, CT scan with the first hospitalization applied microperforation small abscess which has resolved. Subsequent CT scan showed mild inflammation.   Descending colon polyps, removed at colonoscopy . Otherwise colonoscopy did not show any dramatic findings and did show absence of diverticula, raising the question of diagnosis  Recent hospitalization for acute gout flare. Improving. Continue allopurinol. Continue colchicine. Reason the high dose prednisone taper. This could  confound symptoms and physical findings.  Ongoing tobacco abuse, which increases his risk for surgery  Obesity, also a risk factor for surgery  Hypertension  Surgical history of appendectomy     Plan    We had a long talk about differential diagnosis, risk of medical management, risk of surgery. They are aware that we are somewhat in a dilemma right now since he has just been placed on steroids. Steroid effect will resolve, however since he has just stopped the steroid taper  No indication for immediate surgical intervention, but it is quite possible this will be required. They're aware that this may require colostomy or temporary diverting loop ileostomy  Return to see me in 3 weeks. We'll get a CBC and a CT scan of the abdomen and pelvis that time to see if there are any changes.  call if symptoms worsen.        Angelia Mould. Derrell Lolling, M.D., [facility] General and Minimally invasive Surgery Breast and Colorectal Surgery Office:   [phone] Pager:    [phone]  04/30/2013, 9:10 AM

## 2013-04-30 NOTE — Patient Instructions (Signed)
Your abdominal exam today shows minimal tenderness, and no mass.  Dr. Dulce Sellar and I are uncertain as to the cause of your colon inflammation. This may require an operation if you continued to have pain.  Because of your recent hospitalization for gout flare and steroid use, I do not recommend surgery at this point in time.  Return to see Dr. Derrell Lolling in 3 weeks. We will get a CBC and a CT scan immediately before the office visit.

## 2013-05-12 ENCOUNTER — Telehealth (INDEPENDENT_AMBULATORY_CARE_PROVIDER_SITE_OTHER): Payer: Self-pay | Admitting: *Deleted

## 2013-05-12 NOTE — Telephone Encounter (Signed)
I spoke with pt to inform him of his appt for CT scan at [facility] on 05/14/13 with an arrival time of 9:15am.  I instructed pt to not have any solid foods 4 hours prior to scan.  I also instructed pt to drink 1st bottle of contrast at 7:30 am and 2nd bottle at 8:30.  Pt also given their phone number ([phone]) if he needed to reschedule for any reason.

## 2013-05-14 ENCOUNTER — Ambulatory Visit
Admission: RE | Admit: 2013-05-14 | Discharge: 2013-05-14 | Disposition: A | Discharge status: Home / Self Care | Payer: Medicaid Other | Source: Ambulatory Visit | Attending: General Surgery | Admitting: General Surgery

## 2013-05-14 DIAGNOSIS — K5792 Diverticulitis of intestine, part unspecified, without perforation or abscess without bleeding: Secondary | ICD-10-CM

## 2013-05-14 DIAGNOSIS — A0472 Enterocolitis due to Clostridium difficile, not specified as recurrent: Secondary | ICD-10-CM

## 2013-05-14 LAB — CBC WITH DIFFERENTIAL/PLATELET
Eosinophils Absolute: 0.3 10*3/uL (ref 0.0–0.7)
HCT: 41.8 % (ref 39.0–52.0)
Hemoglobin: 14.6 g/dL (ref 13.0–17.0)
Lymphocytes Relative: 16 % (ref 12–46)
Lymphs Abs: 2.2 10*3/uL (ref 0.7–4.0)
MCH: 30.3 pg (ref 26.0–34.0)
Monocytes Absolute: 1 10*3/uL (ref 0.1–1.0)
Monocytes Relative: 7 % (ref 3–12)
Neutro Abs: 9.8 10*3/uL — ABNORMAL HIGH (ref 1.7–7.7)
Neutrophils Relative %: 75 % (ref 43–77)
Platelets: 400 10*3/uL (ref 150–400)
RBC: 4.82 MIL/uL (ref 4.22–5.81)
WBC: 13.2 10*3/uL — ABNORMAL HIGH (ref 4.0–10.5)

## 2013-05-14 MED ORDER — IOHEXOL 300 MG/ML  SOLN
125.0000 mL | Freq: Once | INTRAMUSCULAR | Status: AC | PRN
  Administered 2013-05-14: 125 mL via INTRAVENOUS

## 2013-05-25 ENCOUNTER — Ambulatory Visit (INDEPENDENT_AMBULATORY_CARE_PROVIDER_SITE_OTHER): Payer: Medicaid Other | Admitting: General Surgery

## 2013-05-25 ENCOUNTER — Encounter (INDEPENDENT_AMBULATORY_CARE_PROVIDER_SITE_OTHER): Payer: Self-pay | Admitting: General Surgery

## 2013-05-25 VITALS — BP 160/96 | HR 92 | Temp 97.3°F | Resp 16 | Ht 74.5 in | Wt 250.4 lb

## 2013-05-25 DIAGNOSIS — K5792 Diverticulitis of intestine, part unspecified, without perforation or abscess without bleeding: Secondary | ICD-10-CM

## 2013-05-25 DIAGNOSIS — K5732 Diverticulitis of large intestine without perforation or abscess without bleeding: Secondary | ICD-10-CM

## 2013-05-25 NOTE — Progress Notes (Signed)
Patient ID: Preston [name], male   DOB: 09-01-1970, 42 y.o.   MRN: [id number]  Chief Complaint  Patient presents with  . Routine Post Op    3 wk f/u diverticulitis    HPI Preston [name] is a 42 y.o. male.  He returns for further evaluation and management regarding his presumed sigmoid colitis.  Since I last saw him, he has been off of prednisone for his gout flare for a month. He says he still has pain in the abdomen. He's run out of pain pills he says he is vomiting some. He has actually gained weight. He is having 3-4 bowel movements per day. He continues to smoke one pack per day.  CBC on October 16 shows a white count of 13,200. CT scan on October 16 shows stable thickening of the sigmoid colon, consistent with colitis. No abscess or obstruction noted.  History to date:  he was hospitalized Sept. 20 for 3 days with a severe gout flare. He was put on high-dose prednisone which has been tapered and he took his last dose 2 days ago.   From a GI standpoint, he has 3 or 4 soft bowel movements in the morning, notices left lower quadrant discomfort .  Since he ran a pain medicine he is having more pain. He requested a refill on oxycodone 10. I've discussed his care with Dr. Dulce Sellar. Neither of [place] is certain as to the cause of his sigmoid colitis.   Colonoscopy performed by Dr. Dulce Sellar did not look that bad. There was no obstruction. No diverticular were seen. There was no stricture. No malignancy. No foreign body.There was a little bit of edema of the sigmoid. 2 descending colon polyps were removed .[name] Colonoscopy went all the way to the cecum.  Previously ,his patient was initially hospitalized in April for acute sigmoid diverticulitis, possible perforation with small 2.7 cm abscess. He improved but the pain never completely went away in the left lower quadrant. He was hospitalized again from June 2-5, 2014. CT scan showed that he had acute diverticulitis and the abscess had essentially resolved.  He was discharged once again. He was hospitalized from February 26, 2013 through 03/01/2013 and was diagnosed at with C. Difficile colitis. He was discharged home on Cipro and Flagyl.   .. He is eating normal food. Trying to avoid seeds and nuts as he was advised.  .  Dr. Randa Evens performed a flexible sigmoidoscopy on August 2 while he was in the hospital. It showed minimally inflamed mucosa, no pseudomembranes despite PCR positive C.Diff. This was at 25-35 cm. There were only a few diverticula. No pseudomembranes were seen. He noted a polyp in the descending colon.. CT scan during that hospitalization showed inflamed colon but no abscess. Biopsy of the colon showed minimal colitis.   Past history is significant for hypertension, obesity, current tobacco use, appendectomy. Family history is significant for father dying of cancer, sounds like a metastatic lymphoma. There is no family history of colon cancer.  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 Used to smoke 3 packs a day. Drinks alcohol but try to taper off. 2 children age 89 and 5  HPI  Past Medical History  Diagnosis Date  . Gout   . Hypertension   . Diverticulitis   . Asthma   . GERD (gastroesophageal reflux disease)     Past Surgical History  Procedure Laterality Date  . Appendectomy    . Mouth surgery  2010  . Flexible sigmoidoscopy N/A 02/28/2013    Procedure: FLEXIBLE SIGMOIDOSCOPY;  Surgeon: Vertell Novak., MD;  Location: [facility] ENDOSCOPY;  Service: Endoscopy;  Laterality: N/A;  . Colonoscopy with propofol N/A 04/01/2013    Procedure: COLONOSCOPY WITH PROPOFOL;  Surgeon: Willis Modena, MD;  Location: [facility] ENDOSCOPY;  Service: Endoscopy;  Laterality: N/A;    Family History  Problem Relation Age of Onset  . Cancer Father     Social History History  Substance Use Topics  . Smoking status: Current Every Day Smoker -- 1.00 packs/day for 29 years    Types: Cigarettes  . Smokeless tobacco: Never  Used  . Alcohol Use: No     Comment: drank everyday until 6 months ago and none for 1 month    Allergies  Allergen Reactions  . Morphine And Related Itching, Nausea And Vomiting and Other (See Comments)    "bad feeling"    Current Outpatient Prescriptions  Medication Sig Dispense Refill  . albuterol (PROVENTIL HFA;VENTOLIN HFA) 108 (90 BASE) MCG/ACT inhaler Inhale 2 puffs into the lungs every 6 (six) hours as needed for wheezing.      [name] allopurinol (ZYLOPRIM) 300 MG tablet Take 300 mg by mouth every evening.      . colchicine 0.6 MG tablet Take 1 tablet (0.6 mg total) by mouth daily.  30 tablet  0  . hydrochlorothiazide (MICROZIDE) 12.5 MG capsule Take 12.5 mg by mouth at bedtime.      [name] omeprazole (PRILOSEC OTC) 20 MG tablet Take 20 mg by mouth 2 (two) times daily.      . Probiotic Product (PROBIOTIC PO) Take 1 capsule by mouth daily.       No current facility-administered medications for this visit.    Review of Systems Review of Systems  Constitutional: Negative for fever, chills and unexpected weight change.  HENT: Negative for congestion, hearing loss, sore throat, trouble swallowing and voice change.   Eyes: Negative for visual disturbance.  Respiratory: Negative for cough and wheezing.   Cardiovascular: Negative for chest pain, palpitations and leg swelling.  Gastrointestinal: Positive for vomiting and abdominal pain. Negative for nausea, diarrhea, constipation, blood in stool, abdominal distention, anal bleeding and rectal pain.  Genitourinary: Negative for hematuria and difficulty urinating.  Musculoskeletal: Negative for arthralgias.  Skin: Negative for rash and wound.  Neurological: Negative for seizures, syncope, weakness and headaches.  Hematological: Negative for adenopathy. Does not bruise/bleed easily.  Psychiatric/Behavioral: Negative for confusion.    Blood pressure 160/96, pulse 92, temperature 97.3 F (36.3 C), temperature source Temporal, resp. rate 16,  height 6' 2.5" (1.892 m), weight 250 lb 6.4 oz (113.581 kg).  Physical Exam Physical Exam  Constitutional: He is oriented to person, place, and time. He appears well-developed and well-nourished. No distress.  HENT:  Head: Normocephalic.  Nose: Nose normal.  Mouth/Throat: No oropharyngeal exudate.  Eyes: Conjunctivae and EOM are normal. Pupils are equal, round, and reactive to light. Right eye exhibits no discharge. Left eye exhibits no discharge. No scleral icterus.  Neck: Normal range of motion. Neck supple. No JVD present. No tracheal deviation present. No thyromegaly present.  Cardiovascular: Normal rate, regular rhythm, normal heart sounds and intact distal pulses.   No murmur heard. Pulmonary/Chest: Effort normal and breath sounds normal. No stridor. No respiratory distress. He has no wheezes. He has no rales. He exhibits no tenderness.  Abdominal: Soft. Bowel sounds are normal. He exhibits no distension and no mass. There is no tenderness. There is no  rebound and no guarding.  Somewhat somewhat obese. Tender throughout but more so in the suprapubic area and left lower quadrant. No mass. Laparoscopic scars from previous lap appendectomy.  Musculoskeletal: Normal range of motion. He exhibits no edema and no tenderness.  Lymphadenopathy:    He has no cervical adenopathy.  Neurological: He is alert and oriented to person, place, and time. He has normal reflexes. Coordination normal.  Skin: Skin is warm and dry. No rash noted. He is not diaphoretic. No erythema. No pallor.  Psychiatric: He has a normal mood and affect. His behavior is normal. Judgment and thought content normal.    Data Reviewed Recent lab. CT scan. Old chart.  Assessment    Presumed, but not confirmed recurrent diverticulitis with superimposed C. Difficile colitis. 4 hospitalizations this year, CT scan with the first hospitalization applied microperforation small abscess which has resolved. Subsequent CT scan showed  mild inflammation.  He remains symptomatic, and CT scans had consistently showed sigmoid colitis.  Descending colon polyps, removed at colonoscopy . Otherwise colonoscopy did not show any dramatic findings and did show absence of diverticula, raising the question of diagnosis   Recent hospitalization for acute gout flare. Improving. Continue allopurinol. Continue colchicine. Recent  high dose prednisone taper.   Ongoing tobacco abuse, which increases his risk for surgery  Obesity, also a risk factor for surgery  Hypertension  Surgical history of appendectomy  High risk surgical patient     Plan    Because of continued pain, CT findings of colitis, and mild leukocytosis, I offered him elective sigmoid colectomy, with or without diverting ileostomy, with or without colostomy.  I spent a long time in discussion the indications, details, techniques, and numerous risk of the surgery. He is aware of the risk of life-threatening anastomotic leak, wound infection, high risk for hernia because of his smoking and obesity, injury to adjacent organs, cardiac, pulmonary, and thromboembolic complications. He understands all these issues will. All of his questions were answered. He would like very much to have the surgery.  He will be scheduled for laparoscopic assisted sigmoid colectomy, possible open sigmoid colectomy, possible ostomy.       Angelia Mould. Derrell Lolling, M.D., [facility] General and Minimally invasive Surgery Breast and Colorectal Surgery Office:   [phone] Pager:   [phone]   05/25/2013, 11:19 AM

## 2013-05-25 NOTE — Patient Instructions (Signed)
You are continuing to have pain. Your blood count is still slightly elevated.  The  CT scan continues to show sigmoid colitis.  You will be scheduled for laparoscopic assisted sigmoid colectomy, possible open, with possible diverting colostomy in the near future.    Laparoscopic Colon Resection Laparoscopic colon resection is a relatively new procedure and is not performed in all centers. It may be done to remove a piece of the colon (large intestine) that may be sore and reddened (inflamed). It may be done to remove a portion of bowel that is blocked. The intestine may be blocked because of colon cancer. It is sometimes used to treat diseases of the bowel in which there are multiple small outgrowths from the bowel wall (polyps), which may predispose a person to cancer. LET YOUR CAREGIVER KNOW ABOUT:  Allergies.  Medications taken including herbs, eye drops, over the counter medications, and creams.  Use of steroids (by mouth or creams).  Previous problems with anesthetics or novocaine.  Possibility of pregnancy, if this applies.  History of blood clots (thrombophlebitis).  History of bleeding or blood problems.  Previous surgery.  Other health problems. RISKS AND COMPLICATIONS Some problems, which occur following this procedure, include:  Infection: A germ starts growing in the wound. This can usually be treated with medicine that kills germs (antibiotics).  Bleeding following surgery may be a complication of almost all surgeries. Your surgeon takes every precaution to keep this from happening.  Damage to other organs may occur. If damage to other organs or excessive bleeding should occur it may be necessary to convert the laparoscopic procedure into an open abdominal (belly) procedure. This means the surgery is performed by opening the abdomen and performing the surgery under direct vision. Scarring from previous surgeries or disease may also be a cause to change this procedure to  an open abdominal operation.  Sometimes a leak can occur in the line where the bowel was sewn together after the portion of bowel was removed.  It is possible for the bowel to become obstructed in the area where it was sewn together. When this happens, it is sometimes necessary to operate again to repair this. This may be accomplished using the laparoscope or opening the abdomen and operating in the usual manner without the laparoscope. BEFORE THE PROCEDURE You should be present 2 hours prior to your procedure or as instructed.  PROCEDURE  Laparoscopic means a laparoscope (a small pencil sized telescope) is used. You are made to sleep with medicine (anesthetized). Your surgeon inflates your belly (abdomen) with a needle like device (trocar and cannula). The inflation is done with a harmless gas (carbon dioxide). This makes your organs easier to see. The laparoscope is inserted into your abdomen through a small slit (incision) that allows your surgeon to see into the abdomen. Other small instruments, such as probes and operating instruments, are inserted into the abdomen through other small openings (ports). These ports allow the surgeon to perform the operation. Often surgeons attach a video camera to the laparoscope to enlarge the view. During the procedure the portion of bowel to be removed is taken out through one of the ports. A port may have to be enlarged if the bowel is too large to be removed. In this case a small incision will be made and some times the bowel is reconnected (anastamosis) outside the abdomen. After the procedure, the gas is released, and your incisions are closed with stitches (sutures). Because these incisions are small (usually less  than one-half inch), there is usually minimal discomfort following the procedure. AFTER THE PROCEDURE The recovery time, if there are no problems, is shortened compared to regular surgery. You will rest in a recovery room until you are stable and  doing well. Following this, barring other problems you will be allowed to return to your room. Recovery times vary depending on what is found at surgery, the age of the patient, general health, etc. SEEK IMMEDIATE MEDICAL CARE IF:   There is redness, swelling, or increasing pain in the wound area.  Pus is coming from the wound.  An unexplained oral temperature above 102 F (38.9 C) develops or as directed.  You notice a foul smell coming from the wound or dressing.  There is a breaking open of a wound (edges not staying together) after sutures have been removed.  You develop increasing abdominal pain. Document Released: 10/06/2002 Document Revised: 10/08/2011 Document Reviewed: 08/15/2007 [facility] Patient Information 2014 [place], [place].

## 2013-06-08 ENCOUNTER — Telehealth (INDEPENDENT_AMBULATORY_CARE_PROVIDER_SITE_OTHER): Payer: Self-pay | Admitting: *Deleted

## 2013-06-08 ENCOUNTER — Other Ambulatory Visit (INDEPENDENT_AMBULATORY_CARE_PROVIDER_SITE_OTHER): Payer: Self-pay | Admitting: *Deleted

## 2013-06-08 DIAGNOSIS — K5792 Diverticulitis of intestine, part unspecified, without perforation or abscess without bleeding: Secondary | ICD-10-CM

## 2013-06-08 MED ORDER — HYDROCODONE-ACETAMINOPHEN 10-325 MG PO TABS: 1.0000 | ORAL_TABLET | Freq: Four times a day (QID) | ORAL | Status: DC | PRN

## 2013-06-08 NOTE — Telephone Encounter (Signed)
Patient's wife called to ask about a refill of pain medication.  She states that patient's surgery is scheduled for 07/08/13 colectomy.  I was unable to find where Dr. Derrell Lolling has prescribed pain medications however wife states that he has and asking for more.  Explained that I will send a message to Dr. Derrell Lolling to ask for his opinion then we will let her know.

## 2013-06-09 MED ORDER — OXYCODONE-ACETAMINOPHEN 10-325 MG PO TABS: 1.0000 | ORAL_TABLET | Freq: Four times a day (QID) | ORAL | Status: DC | PRN

## 2013-06-09 NOTE — Telephone Encounter (Signed)
Patient walked in to report the Vicodin script did not have a strength on it but also it is not strong enough for him.  He wants Oxycodone 10.  Dr Derrell Lolling was notified and he destroyed the Vicodin script.  He wrote Percocet 10/325 #30 po 1-2 q 6 hrs prn no refill.  Prescription copied and given to pt.

## 2013-06-09 NOTE — Addendum Note (Signed)
Addended byIvory Broad on: 06/09/2013 11:45 AM   Modules accepted: Orders, Medications

## 2013-06-19 ENCOUNTER — Telehealth (INDEPENDENT_AMBULATORY_CARE_PROVIDER_SITE_OTHER): Payer: Self-pay

## 2013-06-19 DIAGNOSIS — K5792 Diverticulitis of intestine, part unspecified, without perforation or abscess without bleeding: Secondary | ICD-10-CM

## 2013-06-19 MED ORDER — OXYCODONE-ACETAMINOPHEN 10-325 MG PO TABS: 1.0000 | ORAL_TABLET | Freq: Four times a day (QID) | ORAL | Status: DC | PRN

## 2013-06-19 NOTE — Telephone Encounter (Signed)
Pt's wife calling for pt to see about getting refill on the Oxycodone 10/325mg . The pt's wife will not be able to p/u the script till Monday a.m. Please call pt's wife back with the script.

## 2013-06-19 NOTE — Telephone Encounter (Signed)
Per Dr Derrell Lolling ok to refill.  Order put in and RX printed out.  Wife notified.

## 2013-06-27 NOTE — Pre-Procedure Instructions (Signed)
Lannis Lichtenwalner Guster  06/27/2013   Your procedure is scheduled on:  December 10  Report to [facility] Entrance "A" [address] at 07:00 AM.  Call this number if you have problems the morning of surgery: [phone]   Remember:   Do not eat food or drink liquids after midnight.   Take these medicines the morning of surgery with A SIP OF WATER: Oxycodone, Acid reducer, Colchicine, Albuterol (if needed), Valium (if needed)   Do not take Indomethacin starting December 3     STOP/ Do not take Aspirin, Aleve, Naproxen, Advil, Ibuprofen, Vitamin, Herbs, and Supplements starting December 3   Do not wear jewelry, make-up or nail polish.  Do not wear lotions, powders, or perfumes. You may wear deodorant.  Do not shave 48 hours prior to surgery. Men may shave face and neck.  Do not bring valuables to the hospital.  [facility] is not responsible                  for any belongings or valuables.               Contacts, dentures or bridgework may not be worn into surgery.  Leave suitcase in the car. After surgery it may be brought to your room.  For patients admitted to the hospital, discharge time is determined by your                treatment team.               Special Instructions: Shower using CHG 2 nights before surgery and the night before surgery.  If you shower the day of surgery use CHG.  Use special wash - you have one bottle of CHG for all showers.  You should use approximately 1/3 of the bottle for each shower.   Please read over the following fact sheets that you were given: Pain Booklet, Coughing and Deep Breathing, Blood Transfusion Information and Surgical Site Infection Prevention

## 2013-06-29 ENCOUNTER — Ambulatory Visit (HOSPITAL_COMMUNITY)
Admission: RE | Admit: 2013-06-29 | Discharge: 2013-06-29 | Disposition: A | Discharge status: Home / Self Care | Payer: Medicaid Other | Source: Ambulatory Visit | Attending: Anesthesiology | Admitting: Anesthesiology

## 2013-06-29 ENCOUNTER — Encounter (HOSPITAL_COMMUNITY): Payer: Self-pay

## 2013-06-29 ENCOUNTER — Encounter (HOSPITAL_COMMUNITY): Payer: Self-pay | Admitting: Pharmacy Technician

## 2013-06-29 ENCOUNTER — Encounter (HOSPITAL_COMMUNITY)
Admission: RE | Admit: 2013-06-29 | Discharge: 2013-06-29 | Disposition: A | Discharge status: Home / Self Care | Payer: Medicaid Other | Source: Ambulatory Visit | Attending: General Surgery | Admitting: General Surgery

## 2013-06-29 ENCOUNTER — Telehealth (INDEPENDENT_AMBULATORY_CARE_PROVIDER_SITE_OTHER): Payer: Self-pay | Admitting: General Surgery

## 2013-06-29 DIAGNOSIS — K5792 Diverticulitis of intestine, part unspecified, without perforation or abscess without bleeding: Secondary | ICD-10-CM

## 2013-06-29 DIAGNOSIS — I4510 Unspecified right bundle-branch block: Secondary | ICD-10-CM | POA: Insufficient documentation

## 2013-06-29 DIAGNOSIS — Z01810 Encounter for preprocedural cardiovascular examination: Secondary | ICD-10-CM | POA: Insufficient documentation

## 2013-06-29 DIAGNOSIS — Z01818 Encounter for other preprocedural examination: Secondary | ICD-10-CM | POA: Insufficient documentation

## 2013-06-29 HISTORY — DX: Personal history of other infectious and parasitic diseases: Z86.19

## 2013-06-29 HISTORY — DX: Panic disorder (episodic paroxysmal anxiety): F41.0

## 2013-06-29 HISTORY — DX: Anxiety disorder, unspecified: F41.9

## 2013-06-29 LAB — COMPREHENSIVE METABOLIC PANEL
AST: 21 U/L (ref 0–37)
CO2: 23 mEq/L (ref 19–32)
Calcium: 10.5 mg/dL (ref 8.4–10.5)
Creatinine, Ser: 0.56 mg/dL (ref 0.50–1.35)
GFR calc Af Amer: >90 mL/min (ref >90)
GFR calc non Af Amer: >90 mL/min (ref >90)
Glucose, Bld: 82 mg/dL (ref 70–99)
Total Protein: 7.8 g/dL (ref 6.0–8.3)

## 2013-06-29 LAB — CBC WITH DIFFERENTIAL/PLATELET
Basophils Absolute: 0 10*3/uL (ref 0.0–0.1)
Eosinophils Absolute: 0.2 10*3/uL (ref 0.0–0.7)
Eosinophils Relative: 2 % (ref 0–5)
HCT: 47.8 % (ref 39.0–52.0)
Lymphocytes Relative: 21 % (ref 12–46)
Lymphs Abs: 2 10*3/uL (ref 0.7–4.0)
MCH: 30.8 pg (ref 26.0–34.0)
MCHC: 34.1 g/dL (ref 30.0–36.0)
MCV: 90.2 fL (ref 78.0–100.0)
Monocytes Absolute: 0.7 10*3/uL (ref 0.1–1.0)
Platelets: 307 10*3/uL (ref 150–400)
RBC: 5.3 MIL/uL (ref 4.22–5.81)
RDW: 13.3 % (ref 11.5–15.5)
WBC: 9.5 10*3/uL (ref 4.0–10.5)

## 2013-06-29 LAB — TYPE AND SCREEN
ABO/RH(D): A POS
Antibody Screen: NEGATIVE

## 2013-06-29 LAB — URINALYSIS, ROUTINE W REFLEX MICROSCOPIC
Glucose, UA: NEGATIVE mg/dL
Hgb urine dipstick: NEGATIVE
Leukocytes, UA: NEGATIVE
Nitrite: NEGATIVE
Specific Gravity, Urine: 1.005 (ref 1.005–1.030)
Urobilinogen, UA: 0.2 mg/dL (ref 0.0–1.0)
pH: 7 (ref 5.0–8.0)

## 2013-06-29 LAB — ABO/RH: ABO/RH(D): A POS

## 2013-06-29 MED ORDER — OXYCODONE-ACETAMINOPHEN 10-325 MG PO TABS: 1.0000 | ORAL_TABLET | Freq: Four times a day (QID) | ORAL | Status: DC | PRN

## 2013-06-29 NOTE — Addendum Note (Signed)
Addended byIvory Broad on: 06/29/2013 02:25 PM   Modules accepted: Orders

## 2013-06-29 NOTE — Telephone Encounter (Signed)
Patient's wife called for patient to get a refill of oxycodone 10 mg, he is requesting enough to last until his surgery date of 07/08/2013. Please advise. [phone].

## 2013-06-29 NOTE — Telephone Encounter (Signed)
I called the pt and let him know Dr Derrell Lolling authorized #30 of the Oxycodone.  I will have the script signed and it will be up front for pick up.

## 2013-06-29 NOTE — Telephone Encounter (Signed)
OK #30 

## 2013-06-30 NOTE — Progress Notes (Signed)
Anesthesia Chart Review:  Patient is a 42 year old male scheduled for laparoscopic assisted sigmoid colectomy, possible open on 07/08/13.  History includes sigmoid colitis with history of C. difficile colitis 02/2013 and diverticulitis with hospitalization 01/2013, smoking, obesity, HTN, gout with hospitalization 03/2013 for acute flare, GERD, anxiety with panic attacks, asthma.  PCP is listed as Dr. Jeanann Lewandowsky.with [facility].  EKG on 06/29/13 showed NSR, incomplete right BBB.  Preoperative CXR and labs noted.  Preoperative diagnostic results appear acceptable for OR.  If no acute changes then I would anticipate that he could proceed as planned.  Velna Ochs [facility] Short Stay Center/Anesthesiology Phone [phone] 06/30/2013 1:41 PM

## 2013-07-02 NOTE — H&P (Signed)
Long Brimage Dampier   MRN:  [id number]   Description: 42 year old male  Provider: Ernestene Mention, MD  Department: [facility]          Diagnoses      Diverticulitis    -  Primary      562.11                Current Vitals - Last Recorded      BP Pulse Temp(Src) Resp Ht Wt      160/96 92 97.3 F (36.3 C) (Temporal) 16 6' 2.5" (1.892 m) 250 lb 6.4 oz (113.581 kg)     BMI - 31.73 kg/m2                      History and Physical    Ernestene Mention, MD    Status: Signed            Patient ID: Preston [name], male   DOB: 10-17-1970, 42 y.o.   MRN: [id number]                HPI Preston [name] is a 42 y.o. male.  He returns for further evaluation and management regarding his presumed sigmoid colitis.   Since I last saw him, he has been off of prednisone for his gout flare for a month. He says he still has pain in the abdomen. He's run out of pain pills he says he is vomiting some. He has actually gained weight. He is having 3-4 bowel movements per day. He continues to smoke one pack per day.   CBC on October 16 shows a white count of 13,200. CT scan on October 16 shows stable thickening of the sigmoid colon, consistent with colitis. No abscess or obstruction noted.   History to date:  he was hospitalized Sept. 20 for 3 days with a severe gout flare. He was put on high-dose prednisone which has been tapered and he took his last dose 2 days ago.    From a GI standpoint, he has 3 or 4 soft bowel movements in the morning, notices left lower quadrant discomfort .  Since he ran a pain medicine he is having more pain. He requested a refill on oxycodone 10. I've discussed his care with Dr. Dulce Sellar. Neither of [place] is certain as to the cause of his sigmoid colitis.    Colonoscopy performed by Dr. Dulce Sellar did not look that bad. There was no obstruction. No diverticular were seen. There was no stricture. No malignancy. No foreign body.There was a little bit of edema of the  sigmoid. 2 descending colon polyps were removed .[name] Colonoscopy went all the way to the cecum.   Previously ,his patient was initially hospitalized in April for acute sigmoid diverticulitis, possible perforation with small 2.7 cm abscess. He improved but the pain never completely went away in the left lower quadrant. He was hospitalized again from June 2-5, 2014. CT scan showed that he had acute diverticulitis and the abscess had essentially resolved. He was discharged once again. He was hospitalized from February 26, 2013 through 03/01/2013 and was diagnosed at with C. Difficile colitis. He was discharged home on Cipro and Flagyl.   .. He is eating normal food. Trying to avoid seeds and nuts as he was advised.   .   Dr. Randa Evens performed a flexible sigmoidoscopy on August 2 while he was in the hospital. It showed minimally inflamed mucosa, no pseudomembranes despite  PCR positive C.Diff. This was at 25-35 cm. There were only a few diverticula. No pseudomembranes were seen. He noted a polyp in the descending colon.. CT scan during that hospitalization showed inflamed colon but no abscess. Biopsy of the colon showed minimal colitis.    Past history is significant for hypertension, obesity, current tobacco use, appendectomy. Family history is significant for father dying of cancer, sounds like a metastatic lymphoma. There is no family history of colon cancer.   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 Used to smoke 3 packs a day. Drinks alcohol but try to taper off. 2 children age 66 and 64        Past Medical History   Diagnosis  Date   .  Gout     .  Hypertension     .  Diverticulitis     .  Asthma     .  GERD (gastroesophageal reflux disease)           Past Surgical History   Procedure  Laterality  Date   .  Appendectomy       .  Mouth surgery    2010   .  Flexible sigmoidoscopy  N/A  02/28/2013       Procedure: FLEXIBLE SIGMOIDOSCOPY;  Surgeon: Vertell Novak., MD;  [facility];  Service: Endoscopy;  Laterality: N/A;   .  Colonoscopy with propofol  N/A  04/01/2013       Procedure: COLONOSCOPY WITH PROPOFOL;  Surgeon: Willis Modena, MD;  Location: [facility] ENDOSCOPY;  Service: Endoscopy;  Laterality: N/A;         Family History   Problem  Relation  Age of Onset   .  Cancer  Father          Social History History   Substance Use Topics   .  Smoking status:  Current Every Day Smoker -- 1.00 packs/day for 29 years       Types:  Cigarettes   .  Smokeless tobacco:  Never Used   .  Alcohol Use:  No         Comment: drank everyday until 6 months ago and none for 1 month         Allergies   Allergen  Reactions   .  Morphine And Related  Itching, Nausea And Vomiting and Other (See Comments)       "bad feeling"         Current Outpatient Prescriptions   Medication  Sig  Dispense  Refill   .  albuterol (PROVENTIL HFA;VENTOLIN HFA) 108 (90 BASE) MCG/ACT inhaler  Inhale 2 puffs into the lungs every 6 (six) hours as needed for wheezing.         [name]  allopurinol (ZYLOPRIM) 300 MG tablet  Take 300 mg by mouth every evening.         .  colchicine 0.6 MG tablet  Take 1 tablet (0.6 mg total) by mouth daily.   30 tablet   0   .  hydrochlorothiazide (MICROZIDE) 12.5 MG capsule  Take 12.5 mg by mouth at bedtime.         [name]  omeprazole (PRILOSEC OTC) 20 MG tablet  Take 20 mg by mouth 2 (two) times daily.         .  Probiotic Product (PROBIOTIC PO)  Take 1 capsule by mouth daily.  Review of Systems   Constitutional: Negative for fever, chills and unexpected weight change.  HENT: Negative for congestion, hearing loss, sore throat, trouble swallowing and voice change.   Eyes: Negative for visual disturbance.  Respiratory: Negative for cough and wheezing.   Cardiovascular: Negative for chest pain, palpitations and leg swelling.  Gastrointestinal: Positive for vomiting and abdominal pain. Negative for nausea, diarrhea,  constipation, blood in stool, abdominal distention, anal bleeding and rectal pain.  Genitourinary: Negative for hematuria and difficulty urinating.  Musculoskeletal: Negative for arthralgias.  Skin: Negative for rash and wound.  Neurological: Negative for seizures, syncope, weakness and headaches.  Hematological: Negative for adenopathy. Does not bruise/bleed easily.  Psychiatric/Behavioral: Negative for confusion.      Blood pressure 160/96, pulse 92, temperature 97.3 F (36.3 C), temperature source Temporal, resp. rate 16, height 6' 2.5" (1.892 m), weight 250 lb 6.4 oz (113.581 kg).   Physical Exam  Constitutional: He is oriented to person, place, and time. He appears well-developed and well-nourished. No distress.  HENT:   Head: Normocephalic.   Nose: Nose normal.   Mouth/Throat: No oropharyngeal exudate.  Eyes: Conjunctivae and EOM are normal. Pupils are equal, round, and reactive to light. Right eye exhibits no discharge. Left eye exhibits no discharge. No scleral icterus.  Neck: Normal range of motion. Neck supple. No JVD present. No tracheal deviation present. No thyromegaly present.  Cardiovascular: Normal rate, regular rhythm, normal heart sounds and intact distal pulses.    No murmur heard. Pulmonary/Chest: Effort normal and breath sounds normal. No stridor. No respiratory distress. He has no wheezes. He has no rales. He exhibits no tenderness.  Abdominal: Soft. Bowel sounds are normal. He exhibits no distension and no mass. There is no tenderness. There is no rebound and no guarding.  Somewhat somewhat obese. Tender throughout but more so in the suprapubic area and left lower quadrant. No mass. Laparoscopic scars from previous lap appendectomy.  Musculoskeletal: Normal range of motion. He exhibits no edema and no tenderness.  Lymphadenopathy:    He has no cervical adenopathy.  Neurological: He is alert and oriented to person, place, and time. He has normal reflexes.  Coordination normal.  Skin: Skin is warm and dry. No rash noted. He is not diaphoretic. No erythema. No pallor.  Psychiatric: He has a normal mood and affect. His behavior is normal. Judgment and thought content normal.      Data Reviewed Recent lab. CT scan. Old chart.   Assessment     Presumed, but not confirmed recurrent diverticulitis with superimposed C. Difficile colitis. 4 hospitalizations this year, CT scan with the first hospitalization applied microperforation small abscess which has resolved. Subsequent CT scan showed mild inflammation.   He remains symptomatic, and CT scans had consistently showed sigmoid colitis.   Descending colon polyps, removed at colonoscopy . Otherwise colonoscopy did not show any dramatic findings and did show absence of diverticula, raising the question of diagnosis    Recent hospitalization for acute gout flare. Improving. Continue allopurinol. Continue colchicine. Recent  high dose prednisone taper.    Ongoing tobacco abuse, which increases his risk for surgery   Obesity, also a risk factor for surgery   Hypertension   Surgical history of appendectomy   High risk surgical patient       Plan    Because of continued pain, CT findings of colitis, and mild leukocytosis, I offered him elective sigmoid colectomy, with or without diverting ileostomy, with or without colostomy.   I spent a  long time in discussion the indications, details, techniques, and numerous risk of the surgery. He is aware of the risk of life-threatening anastomotic leak, wound infection, high risk for hernia because of his smoking and obesity, injury to adjacent organs, cardiac, pulmonary, and thromboembolic complications. He understands all these issues will. All of his questions were answered. He would like very much to have the surgery.   He will be scheduled for laparoscopic assisted sigmoid colectomy, possible open sigmoid colectomy, possible ostomy.          Angelia Mould. Derrell Lolling, M.D., [facility] General and Minimally invasive Surgery Breast and Colorectal Surgery Office:   [phone] Pager:   [phone]

## 2013-07-07 MED ORDER — SODIUM CHLORIDE 0.9 % IV SOLN
1.0000 g | INTRAVENOUS | Status: AC
  Administered 2013-07-08: 1 g via INTRAVENOUS
  Filled 2013-07-07: qty 1

## 2013-07-08 ENCOUNTER — Encounter (HOSPITAL_COMMUNITY): Payer: Medicaid Other | Admitting: Vascular Surgery

## 2013-07-08 ENCOUNTER — Encounter (HOSPITAL_COMMUNITY)
Admission: RE | Disposition: A | Discharge status: Home / Self Care | Payer: Self-pay | Source: Ambulatory Visit | Attending: General Surgery

## 2013-07-08 ENCOUNTER — Inpatient Hospital Stay (HOSPITAL_COMMUNITY)
Admission: RE | Admit: 2013-07-08 | Discharge: 2013-07-13 | DRG: 331 | Disposition: A | Discharge status: Home / Self Care | Payer: Medicaid Other | Source: Ambulatory Visit | Attending: General Surgery | Admitting: General Surgery

## 2013-07-08 ENCOUNTER — Inpatient Hospital Stay (HOSPITAL_COMMUNITY): Payer: Medicaid Other | Admitting: Anesthesiology

## 2013-07-08 ENCOUNTER — Encounter (HOSPITAL_COMMUNITY): Payer: Self-pay | Admitting: *Deleted

## 2013-07-08 DIAGNOSIS — K5730 Diverticulosis of large intestine without perforation or abscess without bleeding: Secondary | ICD-10-CM

## 2013-07-08 DIAGNOSIS — J45909 Unspecified asthma, uncomplicated: Secondary | ICD-10-CM | POA: Diagnosis present

## 2013-07-08 DIAGNOSIS — F17200 Nicotine dependence, unspecified, uncomplicated: Secondary | ICD-10-CM | POA: Diagnosis present

## 2013-07-08 DIAGNOSIS — K5732 Diverticulitis of large intestine without perforation or abscess without bleeding: Principal | ICD-10-CM | POA: Diagnosis present

## 2013-07-08 DIAGNOSIS — K5792 Diverticulitis of intestine, part unspecified, without perforation or abscess without bleeding: Secondary | ICD-10-CM | POA: Diagnosis present

## 2013-07-08 DIAGNOSIS — K219 Gastro-esophageal reflux disease without esophagitis: Secondary | ICD-10-CM | POA: Diagnosis present

## 2013-07-08 DIAGNOSIS — M109 Gout, unspecified: Secondary | ICD-10-CM | POA: Diagnosis present

## 2013-07-08 DIAGNOSIS — E669 Obesity, unspecified: Secondary | ICD-10-CM | POA: Diagnosis present

## 2013-07-08 DIAGNOSIS — Z6833 Body mass index (BMI) 33.0-33.9, adult: Secondary | ICD-10-CM

## 2013-07-08 DIAGNOSIS — K660 Peritoneal adhesions (postprocedural) (postinfection): Secondary | ICD-10-CM | POA: Diagnosis present

## 2013-07-08 DIAGNOSIS — I10 Essential (primary) hypertension: Secondary | ICD-10-CM | POA: Diagnosis present

## 2013-07-08 HISTORY — PX: LAPAROSCOPIC LOW ANTERIOR RESECTION: SHX5904

## 2013-07-08 SURGERY — RESECTION, RECTUM, LOW ANTERIOR, LAPAROSCOPIC
Anesthesia: General | Site: Abdomen

## 2013-07-08 MED ORDER — COLCHICINE 0.6 MG PO TABS
0.6000 mg | ORAL_TABLET | Freq: Every day | ORAL | Status: DC
  Administered 2013-07-09 – 2013-07-13 (×5): 0.6 mg via ORAL
  Filled 2013-07-08 (×6): qty 1

## 2013-07-08 MED ORDER — CHLORHEXIDINE GLUCONATE 4 % EX LIQD: 1.0000 "application " | Freq: Once | CUTANEOUS | Status: DC

## 2013-07-08 MED ORDER — LACTATED RINGERS IV SOLN
INTRAVENOUS | Status: DC
  Administered 2013-07-08: 08:00:00 via INTRAVENOUS

## 2013-07-08 MED ORDER — PROMETHAZINE HCL 25 MG/ML IJ SOLN: 6.2500 mg | INTRAMUSCULAR | Status: DC | PRN

## 2013-07-08 MED ORDER — PROPOFOL 10 MG/ML IV BOLUS
INTRAVENOUS | Status: DC | PRN
  Administered 2013-07-08: 40 mg via INTRAVENOUS
  Administered 2013-07-08: 250 mg via INTRAVENOUS
  Administered 2013-07-08: 50 mg via INTRAVENOUS

## 2013-07-08 MED ORDER — OXYCODONE HCL 5 MG/5ML PO SOLN: 5.0000 mg | Freq: Once | ORAL | Status: DC | PRN

## 2013-07-08 MED ORDER — HYDROCHLOROTHIAZIDE 12.5 MG PO CAPS
12.5000 mg | ORAL_CAPSULE | Freq: Every day | ORAL | Status: DC
  Administered 2013-07-08 – 2013-07-13 (×6): 12.5 mg via ORAL
  Filled 2013-07-08 (×8): qty 1

## 2013-07-08 MED ORDER — POTASSIUM CHLORIDE IN NACL 20-0.9 MEQ/L-% IV SOLN
INTRAVENOUS | Status: DC
  Administered 2013-07-08 – 2013-07-12 (×8): via INTRAVENOUS
  Filled 2013-07-08 (×12): qty 1000

## 2013-07-08 MED ORDER — ALBUTEROL SULFATE HFA 108 (90 BASE) MCG/ACT IN AERS
2.0000 | INHALATION_SPRAY | Freq: Four times a day (QID) | RESPIRATORY_TRACT | Status: DC | PRN
Start: 2013-07-08 — End: 2013-07-13
  Administered 2013-07-09: 2 via RESPIRATORY_TRACT
  Filled 2013-07-08 (×2): qty 6.7

## 2013-07-08 MED ORDER — SODIUM CHLORIDE 0.9 % IV SOLN
1.0000 g | Freq: Once | INTRAVENOUS | Status: AC
  Administered 2013-07-09: 1 g via INTRAVENOUS
  Filled 2013-07-08: qty 1

## 2013-07-08 MED ORDER — HYDROMORPHONE 0.3 MG/ML IV SOLN
INTRAVENOUS | Status: DC
  Administered 2013-07-08: 4.5 mg via INTRAVENOUS
  Administered 2013-07-08: 1.8 mg via INTRAVENOUS
  Administered 2013-07-09: 3.6 mg via INTRAVENOUS
  Administered 2013-07-09: 05:00:00 via INTRAVENOUS
  Administered 2013-07-09: 4.5 mg via INTRAVENOUS
  Administered 2013-07-09: 5.17 mg via INTRAVENOUS
  Filled 2013-07-08 (×3): qty 25

## 2013-07-08 MED ORDER — 0.9 % SODIUM CHLORIDE (POUR BTL) OPTIME
TOPICAL | Status: DC | PRN
  Administered 2013-07-08 (×4): 1000 mL

## 2013-07-08 MED ORDER — ONDANSETRON HCL 4 MG/2ML IJ SOLN
INTRAMUSCULAR | Status: DC | PRN
  Administered 2013-07-08: 4 mg via INTRAVENOUS

## 2013-07-08 MED ORDER — ARTIFICIAL TEARS OP OINT
TOPICAL_OINTMENT | OPHTHALMIC | Status: DC | PRN
  Administered 2013-07-08: 1 via OPHTHALMIC

## 2013-07-08 MED ORDER — ONDANSETRON HCL 4 MG/2ML IJ SOLN
4.0000 mg | Freq: Four times a day (QID) | INTRAMUSCULAR | Status: DC | PRN
  Filled 2013-07-08: qty 2

## 2013-07-08 MED ORDER — FENTANYL CITRATE 0.05 MG/ML IJ SOLN
INTRAMUSCULAR | Status: DC | PRN
  Administered 2013-07-08 (×2): 100 ug via INTRAVENOUS
  Administered 2013-07-08 (×3): 50 ug via INTRAVENOUS
  Administered 2013-07-08 (×3): 100 ug via INTRAVENOUS
  Administered 2013-07-08: 50 ug via INTRAVENOUS
  Administered 2013-07-08: 100 ug via INTRAVENOUS
  Administered 2013-07-08 (×4): 50 ug via INTRAVENOUS

## 2013-07-08 MED ORDER — GLYCOPYRROLATE 0.2 MG/ML IJ SOLN
INTRAMUSCULAR | Status: DC | PRN
  Administered 2013-07-08: .7 mg via INTRAVENOUS

## 2013-07-08 MED ORDER — VECURONIUM BROMIDE 10 MG IV SOLR
INTRAVENOUS | Status: DC | PRN
  Administered 2013-07-08: 1 mg via INTRAVENOUS
  Administered 2013-07-08 (×2): 2 mg via INTRAVENOUS
  Administered 2013-07-08: 3 mg via INTRAVENOUS

## 2013-07-08 MED ORDER — HYDROMORPHONE HCL PF 1 MG/ML IJ SOLN
INTRAMUSCULAR | Status: AC
  Administered 2013-07-08: 1 mg via INTRAVENOUS
  Filled 2013-07-08: qty 2

## 2013-07-08 MED ORDER — LACTATED RINGERS IV SOLN
INTRAVENOUS | Status: DC | PRN
  Administered 2013-07-08 (×4): via INTRAVENOUS

## 2013-07-08 MED ORDER — SUCCINYLCHOLINE CHLORIDE 20 MG/ML IJ SOLN
INTRAMUSCULAR | Status: DC | PRN
  Administered 2013-07-08: 130 mg via INTRAVENOUS

## 2013-07-08 MED ORDER — ENOXAPARIN SODIUM 40 MG/0.4ML ~~LOC~~ SOLN
40.0000 mg | SUBCUTANEOUS | Status: DC
  Administered 2013-07-09 – 2013-07-13 (×5): 40 mg via SUBCUTANEOUS
  Filled 2013-07-08 (×7): qty 0.4

## 2013-07-08 MED ORDER — NEOSTIGMINE METHYLSULFATE 1 MG/ML IJ SOLN
INTRAMUSCULAR | Status: DC | PRN
  Administered 2013-07-08: 5 mg via INTRAVENOUS

## 2013-07-08 MED ORDER — PHENOL 1.4 % MT LIQD
1.0000 | OROMUCOSAL | Status: DC | PRN
  Administered 2013-07-08: 1 via OROMUCOSAL
  Filled 2013-07-08: qty 177

## 2013-07-08 MED ORDER — NALOXONE HCL 0.4 MG/ML IJ SOLN: 0.4000 mg | INTRAMUSCULAR | Status: DC | PRN

## 2013-07-08 MED ORDER — ONDANSETRON HCL 4 MG PO TABS
4.0000 mg | ORAL_TABLET | Freq: Four times a day (QID) | ORAL | Status: DC | PRN
  Administered 2013-07-09: 4 mg via ORAL
  Filled 2013-07-08 (×2): qty 1

## 2013-07-08 MED ORDER — BUPIVACAINE-EPINEPHRINE PF 0.25-1:200000 % IJ SOLN
INTRAMUSCULAR | Status: AC
  Filled 2013-07-08: qty 30

## 2013-07-08 MED ORDER — HYDROMORPHONE 0.3 MG/ML IV SOLN
INTRAVENOUS | Status: AC
  Filled 2013-07-08: qty 25

## 2013-07-08 MED ORDER — ALLOPURINOL 300 MG PO TABS
300.0000 mg | ORAL_TABLET | Freq: Every evening | ORAL | Status: DC
  Administered 2013-07-08 – 2013-07-13 (×5): 300 mg via ORAL
  Filled 2013-07-08 (×7): qty 1

## 2013-07-08 MED ORDER — SODIUM CHLORIDE 0.9 % IJ SOLN: 9.0000 mL | INTRAMUSCULAR | Status: DC | PRN

## 2013-07-08 MED ORDER — LIDOCAINE HCL (CARDIAC) 20 MG/ML IV SOLN
INTRAVENOUS | Status: DC | PRN
  Administered 2013-07-08: 100 mg via INTRAVENOUS

## 2013-07-08 MED ORDER — ALVIMOPAN 12 MG PO CAPS
12.0000 mg | ORAL_CAPSULE | Freq: Once | ORAL | Status: AC
  Administered 2013-07-08: 12 mg via ORAL
  Filled 2013-07-08: qty 1

## 2013-07-08 MED ORDER — MIDAZOLAM HCL 5 MG/5ML IJ SOLN
INTRAMUSCULAR | Status: DC | PRN
  Administered 2013-07-08 (×2): 2 mg via INTRAVENOUS

## 2013-07-08 MED ORDER — HYDROMORPHONE HCL PF 1 MG/ML IJ SOLN
0.2500 mg | INTRAMUSCULAR | Status: DC | PRN
  Administered 2013-07-08: 1 mg via INTRAVENOUS

## 2013-07-08 MED ORDER — DIPHENHYDRAMINE HCL 12.5 MG/5ML PO ELIX: 12.5000 mg | ORAL_SOLUTION | Freq: Four times a day (QID) | ORAL | Status: DC | PRN

## 2013-07-08 MED ORDER — OXYCODONE HCL 5 MG PO TABS: 5.0000 mg | ORAL_TABLET | Freq: Once | ORAL | Status: DC | PRN

## 2013-07-08 MED ORDER — SODIUM CHLORIDE 0.9 % IR SOLN
Status: DC | PRN
  Administered 2013-07-08: 1000 mL

## 2013-07-08 MED ORDER — HYDROMORPHONE HCL PF 1 MG/ML IJ SOLN
INTRAMUSCULAR | Status: DC | PRN
  Administered 2013-07-08: 1 mg via INTRAVENOUS

## 2013-07-08 MED ORDER — LISINOPRIL 10 MG PO TABS
10.0000 mg | ORAL_TABLET | Freq: Every day | ORAL | Status: DC
  Administered 2013-07-08 – 2013-07-13 (×6): 10 mg via ORAL
  Filled 2013-07-08 (×7): qty 1

## 2013-07-08 MED ORDER — HYDROMORPHONE 0.3 MG/ML IV SOLN
INTRAVENOUS | Status: DC
  Administered 2013-07-08: 15:00:00 via INTRAVENOUS

## 2013-07-08 MED ORDER — LISINOPRIL-HYDROCHLOROTHIAZIDE 10-12.5 MG PO TABS: 1.0000 | ORAL_TABLET | Freq: Every day | ORAL | Status: DC

## 2013-07-08 MED ORDER — DIPHENHYDRAMINE HCL 50 MG/ML IJ SOLN: 12.5000 mg | Freq: Four times a day (QID) | INTRAMUSCULAR | Status: DC | PRN

## 2013-07-08 MED ORDER — BUPIVACAINE-EPINEPHRINE 0.25% -1:200000 IJ SOLN
INTRAMUSCULAR | Status: DC | PRN
  Administered 2013-07-08: 21 mL

## 2013-07-08 MED ORDER — MENTHOL 3 MG MT LOZG
1.0000 | LOZENGE | OROMUCOSAL | Status: DC | PRN
  Administered 2013-07-08: 3 mg via ORAL
  Filled 2013-07-08: qty 9

## 2013-07-08 MED ORDER — ONDANSETRON HCL 4 MG/2ML IJ SOLN
4.0000 mg | Freq: Four times a day (QID) | INTRAMUSCULAR | Status: DC | PRN
  Administered 2013-07-08 – 2013-07-12 (×7): 4 mg via INTRAVENOUS
  Filled 2013-07-08 (×6): qty 2

## 2013-07-08 MED ORDER — LORAZEPAM 2 MG/ML IJ SOLN
1.0000 mg | INTRAMUSCULAR | Status: DC | PRN
  Administered 2013-07-08 – 2013-07-12 (×6): 1 mg via INTRAVENOUS
  Filled 2013-07-08 (×7): qty 1

## 2013-07-08 MED ORDER — ROCURONIUM BROMIDE 100 MG/10ML IV SOLN
INTRAVENOUS | Status: DC | PRN
  Administered 2013-07-08: 50 mg via INTRAVENOUS

## 2013-07-08 MED ORDER — ALVIMOPAN 12 MG PO CAPS
12.0000 mg | ORAL_CAPSULE | Freq: Two times a day (BID) | ORAL | Status: DC
  Administered 2013-07-09 – 2013-07-12 (×7): 12 mg via ORAL
  Filled 2013-07-08 (×10): qty 1

## 2013-07-08 MED ORDER — PANTOPRAZOLE SODIUM 40 MG IV SOLR
40.0000 mg | Freq: Two times a day (BID) | INTRAVENOUS | Status: DC
  Administered 2013-07-08 – 2013-07-12 (×8): 40 mg via INTRAVENOUS
  Filled 2013-07-08 (×12): qty 40

## 2013-07-08 SURGICAL SUPPLY — 77 items
APPLICATOR COTTON TIP 6IN STRL (MISCELLANEOUS) ×2 IMPLANT
APPLIER CLIP ROT 10 11.4 M/L (STAPLE)
BLADE SURG 10 STRL SS (BLADE) ×2 IMPLANT
BLADE SURG ROTATE 9660 (MISCELLANEOUS) ×4 IMPLANT
CANISTER SUCTION 2500CC (MISCELLANEOUS) ×2 IMPLANT
CELLS DAT CNTRL 66122 CELL SVR (MISCELLANEOUS) IMPLANT
CHLORAPREP W/TINT 26ML (MISCELLANEOUS) ×2 IMPLANT
CLAMP ENDO BABCK 10MM (STAPLE) IMPLANT
CLIP APPLIE ROT 10 11.4 M/L (STAPLE) IMPLANT
COVER MAYO STAND STRL (DRAPES) ×4 IMPLANT
COVER SURGICAL LIGHT HANDLE (MISCELLANEOUS) ×4 IMPLANT
DRAPE PROXIMA HALF (DRAPES) ×4 IMPLANT
DRAPE UTILITY 15X26 W/TAPE STR (DRAPE) ×10 IMPLANT
DRAPE WARM FLUID 44X44 (DRAPE) ×2 IMPLANT
DRSG OPSITE POSTOP 4X10 (GAUZE/BANDAGES/DRESSINGS) IMPLANT
DRSG OPSITE POSTOP 4X8 (GAUZE/BANDAGES/DRESSINGS) IMPLANT
ELECT BLADE 6.5 EXT (BLADE) ×2 IMPLANT
ELECT CAUTERY BLADE 6.4 (BLADE) ×4 IMPLANT
ELECT REM PT RETURN 9FT ADLT (ELECTROSURGICAL) ×2
ELECTRODE REM PT RTRN 9FT ADLT (ELECTROSURGICAL) ×1 IMPLANT
GAUZE SPONGE 4X4 16PLY XRAY LF (GAUZE/BANDAGES/DRESSINGS) ×2 IMPLANT
GEL ULTRASOUND 20GR AQUASONIC (MISCELLANEOUS) IMPLANT
GLOVE BIO SURGEON STRL SZ7.5 (GLOVE) ×8 IMPLANT
GLOVE BIOGEL PI IND STRL 6.5 (GLOVE) ×1 IMPLANT
GLOVE BIOGEL PI IND STRL 7.5 (GLOVE) ×2 IMPLANT
GLOVE BIOGEL PI IND STRL 8 (GLOVE) ×1 IMPLANT
GLOVE BIOGEL PI INDICATOR 6.5 (GLOVE) ×1
GLOVE BIOGEL PI INDICATOR 7.5 (GLOVE) ×2
GLOVE BIOGEL PI INDICATOR 8 (GLOVE) ×1
GLOVE EUDERMIC 7 POWDERFREE (GLOVE) ×4 IMPLANT
GLOVE SURG SS PI 6.5 STRL IVOR (GLOVE) ×2 IMPLANT
GLOVE SURG SS PI 7.0 STRL IVOR (GLOVE) ×4 IMPLANT
GOWN STRL NON-REIN LRG LVL3 (GOWN DISPOSABLE) ×6 IMPLANT
GOWN STRL REIN XL XLG (GOWN DISPOSABLE) ×8 IMPLANT
KIT BASIN OR (CUSTOM PROCEDURE TRAY) ×2 IMPLANT
KIT ROOM TURNOVER OR (KITS) ×2 IMPLANT
LEGGING LITHOTOMY PAIR STRL (DRAPES) ×2 IMPLANT
LIGASURE IMPACT 36 18CM CVD LR (INSTRUMENTS) ×2 IMPLANT
NS IRRIG 1000ML POUR BTL (IV SOLUTION) ×8 IMPLANT
PAD ARMBOARD 7.5X6 YLW CONV (MISCELLANEOUS) ×4 IMPLANT
PENCIL BUTTON HOLSTER BLD 10FT (ELECTRODE) ×4 IMPLANT
RTRCTR WOUND ALEXIS 18CM MED (MISCELLANEOUS)
SCALPEL HARMONIC ACE (MISCELLANEOUS) ×2 IMPLANT
SCISSORS LAP 5X35 DISP (ENDOMECHANICALS) ×2 IMPLANT
SET IRRIG TUBING LAPAROSCOPIC (IRRIGATION / IRRIGATOR) ×2 IMPLANT
SLEEVE ENDOPATH XCEL 5M (ENDOMECHANICALS) ×8 IMPLANT
SPECIMEN JAR LARGE (MISCELLANEOUS) ×2 IMPLANT
SPONGE GAUZE 4X4 12PLY (GAUZE/BANDAGES/DRESSINGS) ×2 IMPLANT
SPONGE LAP 18X18 X RAY DECT (DISPOSABLE) ×4 IMPLANT
STAPLER CIRC CVD 29MM 37CM (STAPLE) ×2 IMPLANT
STAPLER CUT CVD 40MM GREEN (STAPLE) ×2 IMPLANT
STAPLER VISISTAT 35W (STAPLE) ×2 IMPLANT
SURGILUBE 2OZ TUBE FLIPTOP (MISCELLANEOUS) ×2 IMPLANT
SUT PDS AB 1 TP1 96 (SUTURE) ×4 IMPLANT
SUT PROLENE 0 SH 30 (SUTURE) ×2 IMPLANT
SUT SILK 2 0 TIES 10X30 (SUTURE) ×2 IMPLANT
SUT SILK 3 0 SH CR/8 (SUTURE) ×2 IMPLANT
SUT SILK 3 0 TIES 10X30 (SUTURE) ×2 IMPLANT
SUT VIC AB 0 CT1 27 (SUTURE) ×3
SUT VIC AB 0 CT1 27XBRD ANBCTR (SUTURE) ×3 IMPLANT
SUT VIC AB 3-0 SH 18 (SUTURE) IMPLANT
SYR BULB IRRIGATION 50ML (SYRINGE) ×2 IMPLANT
SYS LAPSCP GELPORT 120MM (MISCELLANEOUS)
SYSTEM LAPSCP GELPORT 120MM (MISCELLANEOUS) IMPLANT
TOWEL OR 17X24 6PK STRL BLUE (TOWEL DISPOSABLE) ×2 IMPLANT
TOWEL OR 17X26 10 PK STRL BLUE (TOWEL DISPOSABLE) ×2 IMPLANT
TRAY FOLEY CATH 14FRSI W/METER (CATHETERS) ×2 IMPLANT
TRAY LAPAROSCOPIC (CUSTOM PROCEDURE TRAY) ×2 IMPLANT
TROCAR XCEL 12X100 BLDLESS (ENDOMECHANICALS) IMPLANT
TROCAR XCEL BLADELESS 5X75MML (TROCAR) IMPLANT
TROCAR XCEL BLUNT TIP 100MML (ENDOMECHANICALS) ×2 IMPLANT
TROCAR XCEL NON-BLD 11X100MML (ENDOMECHANICALS) IMPLANT
TROCAR XCEL NON-BLD 5MMX100MML (ENDOMECHANICALS) ×2 IMPLANT
TUBE CONNECTING 12X1/4 (SUCTIONS) ×6 IMPLANT
TUBING FILTER THERMOFLATOR (ELECTROSURGICAL) IMPLANT
WATER STERILE IRR 1000ML POUR (IV SOLUTION) IMPLANT
YANKAUER SUCT BULB TIP NO VENT (SUCTIONS) ×6 IMPLANT

## 2013-07-08 NOTE — Anesthesia Procedure Notes (Signed)
Procedure Name: Intubation Date/Time: 07/08/2013 9:02 AM Performed by: Margaree Mackintosh Pre-anesthesia Checklist: Patient identified, Timeout performed, Emergency Drugs available, Suction available and Patient being monitored Patient Re-evaluated:Patient Re-evaluated prior to inductionOxygen Delivery Method: Circle system utilized Preoxygenation: Pre-oxygenation with 100% oxygen Intubation Type: IV induction Ventilation: Mask ventilation without difficulty and Oral airway inserted - appropriate to patient size Laryngoscope Size: Mac and 4 Grade View: Grade I Tube type: Oral Tube size: 8.0 mm Number of attempts: 1 Airway Equipment and Method: Stylet Placement Confirmation: positive ETCO2,  ETT inserted through vocal cords under direct vision and breath sounds checked- equal and bilateral Secured at: 23 cm Tube secured with: Tape Dental Injury: Teeth and Oropharynx as per pre-operative assessment

## 2013-07-08 NOTE — Anesthesia Postprocedure Evaluation (Signed)
  Anesthesia Post-op Note  Patient: Preston [name]  Procedure(s) Performed: Procedure(s): LAPAROSCOPIC LOW ANTERIOR RESECTION WITH TAKEDOWN OF SPLENIC FLEXURE. (N/A)  Patient Location: PACU  Anesthesia Type:General  Level of Consciousness: awake and alert   Airway and Oxygen Therapy: Patient Spontanous Breathing  Post-op Pain: mild  Post-op Assessment: Post-op Vital signs reviewed  Post-op Vital Signs: stable  Complications: No apparent anesthesia complications

## 2013-07-08 NOTE — Progress Notes (Signed)
Notified Dr. Lindie Spruce that patient's dressing honeycomb dressing site was saturated with blood.  Dr. Lindie Spruce said to reinforce dressing with ABD and tape sides.  This was done for patient.

## 2013-07-08 NOTE — Preoperative (Signed)
Beta Blockers   Reason not to administer Beta Blockers:Not Applicable 

## 2013-07-08 NOTE — Transfer of Care (Signed)
Immediate Anesthesia Transfer of Care Note  Patient: Preston [name]  Procedure(s) Performed: Procedure(s): LAPAROSCOPIC LOW ANTERIOR RESECTION WITH TAKEDOWN OF SPLENIC FLEXURE. (N/A)  Patient Location: PACU  Anesthesia Type:General  Level of Consciousness: awake and alert   Airway & Oxygen Therapy: Patient Spontanous Breathing and Patient connected to nasal cannula oxygen  Post-op Assessment: Report given to PACU RN, Post -op Vital signs reviewed and stable and Patient moving all extremities X 4  Post vital signs: Reviewed and stable  Complications: No apparent anesthesia complications

## 2013-07-08 NOTE — Interval H&P Note (Signed)
History and Physical Interval Note:  07/08/2013 8:21 AM  Preston [name]  has presented today for surgery, with the diagnosis of diverticulitis  The goals and the various methods of treatment have been discussed with the patient and family. After consideration of risks, benefits and other options for treatment, the patient has consented to  Procedure(s): LAPAROSCOPIC ASSISTED  SIGMOID COLECTOMY/POSSIBLE OPEN (N/A) , as a surgical intervention .  The patient's history has been reviewed, patient examined today, no change in status, stable for surgery.  I have reviewed the patient's chart and labs.  Questions were answered to the patient's satisfaction.  He and his wife are aware of the possibility of temporary or permanent ostomy.   Angelia Mould. Derrell Lolling, M.D., [facility] General and Minimally invasive Surgery Breast and Colorectal Surgery Office:   [phone] Pager:   [phone]

## 2013-07-08 NOTE — Op Note (Signed)
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 Symptomatic.  Post op Diagnosis:    Same  Procedure:                 Laparoscopic lysis of adhesions, laparoscopic takedown of splenic flexure, laparoscopic-assisted low anterior resection with 29 mm EEA stapled anastomosis  Surgeon:                     Preston [name]. Preston [name], M.D., FACS  Assistant:                      Preston [name], M.D.  Operative Indications:   Preston [name] is a 42 y.o. male. He has been evaluated on numerous occasions for presumed sigmoid diverticulitis, both as inpatient and outpatient. presumed sigmoid colitis. . CT scan on October 16 shows stable thickening of the sigmoid colon, consistent with colitis. No abscess or obstruction noted.He continues to require referral narcotic medication because of left lower quadrant pain.   I've discussed his care with Dr. Dulce Sellar. Neither of [place] is certain as to the cause of his sigmoid colitis.  Colonoscopy performed by Dr. Dulce Sellar did not look that bad. There was no obstruction. No diverticular were seen. There was no stricture. No malignancy. No foreign body.There was a little bit of edema of the sigmoid. 2 descending colon polyps were removed .[name] Colonoscopy went all the way to the cecum.   Previously ,his patient was initially hospitalized in April for acute sigmoid diverticulitis, possible perforation with small 2.7 cm abscess. He improved but the pain never completely went away in the left lower quadrant. He was hospitalized again from June 2-5, 2014. CT scan showed that he had acute diverticulitis and the abscess had essentially resolved. He was discharged once again. He was hospitalized from February 26, 2013 through 03/01/2013 and was diagnosed at with C. Difficile colitis. He was discharged home on Cipro and Flagyl. .. He is eating normal  food. Trying to avoid seeds and nuts as he was advised.  .  Dr. Randa Evens performed a flexible sigmoidoscopy on August 2 while he was in the hospital. It showed minimally inflamed mucosa, no pseudomembranes despite PCR positive C.Diff. This was at 25-35 cm. There were only a few diverticula. No pseudomembranes were seen. He noted a polyp in the descending colon.. CT scan during that hospitalization showed inflamed colon but no abscess. Biopsy of the colon showed minimal colitis.   He continues to have left lower quadrant pain requiring narcotic medication. He is tolerating a diet occasionally vomits. He is having bowel movements without blood. He is gaining weight with a BMI of 32. Because of continued left lower quadrant pain and CT findings suggesting thickening and chronic colitis of the sigmoid colon, he is brought to the operating room electively following  a bowel prep. Past history is significant for hypertension, obesity, current tobacco use, appendectomy.There is no family history of colon cancer.  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 Used to smoke 3 packs a day. Drinks alcohol    Operative Findings:       Large man with a very large fatty omentum. Sigmoid colon notable for chronic thickening and inflammation, almost rock hard and  densely adherent to the left lateral pelvic sidewall requiring sharp and blunt  dissection to get it free. The proximal colon and the rectum were actually quite soft, with good vascularity and no muscular hypertrophy. The liver and stomach and spleen looks normal. The small bowel looked normal. Anastomosis created from the distal descending colon to the mid rectum with a 29 mm EEA stapling device. Anastomosis was several centimeters above the peritoneal reflection.  Procedure in Detail:          Following the induction of general endotracheal anesthesia a Foley catheter was placed, an oral gastric tube was placed. Intravenous  antibiotics were given. The patient was positioned in rigid padded stirrups in a modified lithotomy position. The entire abdomen and groins and genitalia and perianal area were prepped and draped in a sterile fashion. Surgical time out was performed.     An 11 mm Hassan trocar was placed in the midline at the upper rim of the umbilicus with an open technique. Pneumoperitoneum was created to be a camera was inserted. 4 other 5 mm trocars were placed. The abdomen and pelvis was explored. We found that we could dissect some of the proximal rectum off the pelvic sidewall and the descending colon as well, but the sigmoid colon would not dissect with  the laparoscopic instruments The colon being almost rockhard. We chose to laparoscopically mobilize the descending colon, splenic flexure, and transverse colon laparoscopically and that was successful. We were able to mobilize the descending colon splenic flexure off of gerota's fascia. We were able to dissect the omentum off of the splenic flexure of the colon. This was somewhat tedious because of the very large omentum making exposure difficult but with this went well visually. We were able to to bring the splenic flexure down. The transverse colon, splenic flexure and  descending colon looked good without any sign of any injury. The spleen looked good and there was no sign of any bleeding.  At this point we made a lower midline incision dividing the fascia with electrocautery. Self-retaining retractors were placed. The small bowel was  packed away. I then was able to assess the sigmoid colon by hand and then slowly with sharp or blunt dissection dissected away from the lateral pelvic sidewall, staying well out of the  retroperitoneum. I incised the mesenteric peritoneum on the right and the left to mobilize the rectosigmoid. I cleaned off the rectum about 8 or 10 cm above the peritoneal reflection. This was a very large man with a very deep narrow pelvis. I was able  to divide the rectum at the area where the tinea were going away. This was divided with the contour stapling device. At this point the bowel was and thin-walled and healthy and there was no muscular hypertrophy. I very conservatively divided the sigmoid mesentery with the LigaSure device. A couple of figure-of-eight sutures of 2-0 silk were required to control mesenteric vessels. I took the  the dissection up above the inflamed area and about 2 cm above the junction of the sigmoid and descending colons, cleaned this off at this point and the colon looked pink and healthy without any muscular hypertrophy. I divided the distal descending colon between Allen clamps and removed the specimen marking the proximal margin with a silk suture. I then opened the proximal end of the colon and it was pink and healthy. Mucosa looked healthy. I was easily able to pass a 25 and a 29 mm sizer. I placed a pursestring suture  of 0 Prolene in the proximal colon and inserted the anvil  of a 29 mm EEA stapler into this and tied the pursestring suture down. I trimmed a little bit of the pericolonic fat away.  Dr. Derrell [name] then went down to the perineal approach he dilated the rectum and passed the sizers up. He then passed the stapler up and with guidance we were get all the way to the end of the staple line. Under direct vision he then opened the stapler and the spike of the stapler came out well positioned.  I carefully positioned the proximal colon with the anvil on it being careful not to twist this in any way. He slowly closed the stapler, fired it,  opened and removed it. We had 2 excellent donuts of tissue. We used a noncrushing bowel clamp above the anastomosis. Dr. Derrell [name] performed proctoscopy and insufflation under pressure there was no air leak or bubbles. We then copiously irrigated out the pelvis and the lower abdomen. There was no bleeding.  At this point in time we did the changeover with new gowns, gloves and  instruments and redraping the patient. We then came back in and checked the pelvis and the anastomosis  again looked good. No drains were required. We irrigated one more time. Brought the omentum down. The midline fascia was closed with a running suture of #1, double-stranded PDS. The skin was irrigated and the skin closed loosely with a few staples and some Telfa wicks. We then recreated pneumoperitoneum and reinserted the camera and looked around. There was no blood in the pelvis, no blood along the left paracolic gutter and no blood in the left subphrenic space and the spleen looked good. We did we did not require any further irrigation. The trocars were removed the pneumoperitoneum was released. The fascia above the umbilicus was closed with 0 Vicryl sutures. The rest of the trocars sites were closed with silk staples. Protocol bandages were placed and the patient taken to recovery in stable condition. EBL 150-200 cc. Counts correct. Complications none.     Preston [name]. Preston [name], M.D., FACS General and Minimally Invasive Surgery Breast and Colorectal Surgery  07/08/2013 12:34 PM

## 2013-07-08 NOTE — Anesthesia Preprocedure Evaluation (Signed)
Anesthesia Evaluation  Patient identified by MRN, date of birth, ID band Patient awake    Reviewed: Allergy & Precautions, H&P , NPO status   Airway Mallampati: I      Dental   Pulmonary asthma , Current Smoker,  breath sounds clear to auscultation        Cardiovascular hypertension, Rhythm:Regular Rate:Normal     Neuro/Psych    GI/Hepatic GERD-  ,  Endo/Other    Renal/GU      Musculoskeletal   Abdominal   Peds  Hematology   Anesthesia Other Findings   Reproductive/Obstetrics                           Anesthesia Physical Anesthesia Plan  ASA: II  Anesthesia Plan: General   Post-op Pain Management:    Induction: Intravenous  Airway Management Planned: Oral ETT  Additional Equipment:   Intra-op Plan:   Post-operative Plan: Extubation in OR  Informed Consent: I have reviewed the patients History and Physical, chart, labs and discussed the procedure including the risks, benefits and alternatives for the proposed anesthesia with the patient or authorized representative who has indicated his/her understanding and acceptance.     Plan Discussed with: CRNA and Surgeon  Anesthesia Plan Comments:         Anesthesia Quick Evaluation

## 2013-07-09 ENCOUNTER — Encounter (HOSPITAL_COMMUNITY): Payer: Self-pay | Admitting: General Surgery

## 2013-07-09 LAB — CBC
HCT: 42.4 % (ref 39.0–52.0)
Hemoglobin: 14.2 g/dL (ref 13.0–17.0)
MCH: 31.3 pg (ref 26.0–34.0)
MCHC: 33.5 g/dL (ref 30.0–36.0)
MCV: 93.4 fL (ref 78.0–100.0)
Platelets: 300 10*3/uL (ref 150–400)
RBC: 4.54 MIL/uL (ref 4.22–5.81)
RDW: 13.4 % (ref 11.5–15.5)
WBC: 15.2 10*3/uL — ABNORMAL HIGH (ref 4.0–10.5)

## 2013-07-09 LAB — BASIC METABOLIC PANEL
BUN: 6 mg/dL (ref 6–23)
CO2: 27 mEq/L (ref 19–32)
Calcium: 8.8 mg/dL (ref 8.4–10.5)
Chloride: 99 mEq/L (ref 96–112)
Creatinine, Ser: 0.69 mg/dL (ref 0.50–1.35)
GFR calc Af Amer: >90 mL/min (ref >90)
GFR calc non Af Amer: >90 mL/min (ref >90)
Glucose, Bld: 100 mg/dL — ABNORMAL HIGH (ref 70–99)
Potassium: 3.8 mEq/L (ref 3.5–5.1)
Sodium: 136 mEq/L (ref 135–145)

## 2013-07-09 MED ORDER — CALCIUM CARBONATE ANTACID 500 MG PO CHEW
1.0000 | CHEWABLE_TABLET | Freq: Four times a day (QID) | ORAL | Status: DC | PRN
  Administered 2013-07-09 – 2013-07-10 (×4): 200 mg via ORAL
  Filled 2013-07-09 (×5): qty 1

## 2013-07-09 MED ORDER — HYDROMORPHONE 0.3 MG/ML IV SOLN
INTRAVENOUS | Status: DC
  Administered 2013-07-10: 3.99 mg via INTRAVENOUS
  Administered 2013-07-10: 5.6 mg via INTRAVENOUS
  Administered 2013-07-10: 2.49 mg via INTRAVENOUS
  Administered 2013-07-10: 3.99 mg via INTRAVENOUS
  Administered 2013-07-10: 5.14 mg via INTRAVENOUS
  Administered 2013-07-10: 02:00:00 via INTRAVENOUS
  Administered 2013-07-10 (×2): 2.99 mg via INTRAVENOUS
  Administered 2013-07-10 – 2013-07-11 (×3): via INTRAVENOUS
  Administered 2013-07-11: 3.49 mg via INTRAVENOUS
  Administered 2013-07-11: 4.49 mg via INTRAVENOUS
  Administered 2013-07-11: 09:00:00 via INTRAVENOUS
  Administered 2013-07-11: 3.42 mg via INTRAVENOUS
  Administered 2013-07-11: 2.49 mg via INTRAVENOUS
  Administered 2013-07-11: 3.99 mg via INTRAVENOUS
  Administered 2013-07-11: 2.49 mg via INTRAVENOUS
  Administered 2013-07-12 (×2): via INTRAVENOUS
  Administered 2013-07-12: 2.99 mg via INTRAVENOUS
  Administered 2013-07-12: 2.65 mg via INTRAVENOUS
  Administered 2013-07-12: 17:00:00 via INTRAVENOUS
  Administered 2013-07-12: 8.8 mg via INTRAVENOUS
  Administered 2013-07-12: 4.99 mL via INTRAVENOUS
  Administered 2013-07-13: 03:00:00 via INTRAVENOUS
  Administered 2013-07-13: 2.99 mg via INTRAVENOUS
  Filled 2013-07-09 (×11): qty 25

## 2013-07-09 NOTE — Progress Notes (Addendum)
1 Day Post-Op  Subjective: Stable and alert. Vital signs stable. Good urine output. Cough strong. Coughing up some sputum. No shortness of breath. PaO2 96-98% on 2 L. Slight nausea last light. This has resolved. Pain control good with PCA Dilaudid but still sore.  I explained the operative findings and operative events.  Complains of heartburn.  Hemoglobin 14.4. WBC 15,200. Potassium 3.8. BUN 6. Glucose 100.  Objective: Vital signs in last 24 hours: Temp:  [97.7 F (36.5 C)-98.7 F (37.1 C)] 98.2 F (36.8 C) (12/11 0530) Pulse Rate:  [87-119] 105 (12/11 0530) Resp:  [11-23] 21 (12/11 0530) BP: (128-169)/(75-105) 128/85 mmHg (12/11 0530) SpO2:  [93 %-100 %] 96 % (12/11 0530) Weight:  [264 lb 4.8 oz (119.886 kg)] 264 lb 4.8 oz (119.886 kg) (12/10 1545) Last BM Date: 07/08/13  Intake/Output from previous day: 12/10 0701 - 12/11 0700 In: 4512.5 [I.V.:4512.5] Out: 1825 [Urine:1625; Blood:200] Intake/Output this shift: Total I/O In: 1512.5 [I.V.:1512.5] Out: 950 [Urine:950]    EXAM: General appearance: alert. Cooperative. Minimal distress. Mental status normal. Resp: lungs are basically clear bilaterally. Occasional coarse breath sounds at bases bilaterally. No wheeze.  Moves air reasonably well. GI: abdomen obese. Soft. Appropriately tender.  Silent. Not distended. Midline wound has some bloody drainage, not excessive.drainage. Wound is intact.  Lab Results:  Results for orders placed during the hospital encounter of 07/08/13 (from the past 24 hour(s))  BASIC METABOLIC PANEL     Status: Abnormal   Collection Time    07/09/13  3:45 AM      Result Value Range   Sodium 136  135 - 145 mEq/L   Potassium 3.8  3.5 - 5.1 mEq/L   Chloride 99  96 - 112 mEq/L   CO2 27  19 - 32 mEq/L   Glucose, Bld 100 (*) 70 - 99 mg/dL   BUN 6  6 - 23 mg/dL   Creatinine, Ser 1.61  0.50 - 1.35 mg/dL   Calcium 8.8  8.4 - 09.6 mg/dL   GFR calc non Af Amer >90  >90 mL/min   GFR calc Af Amer >90  >90  mL/min  CBC     Status: Abnormal   Collection Time    07/09/13  3:45 AM      Result Value Range   WBC 15.2 (*) 4.0 - 10.5 K/uL   RBC 4.54  4.22 - 5.81 MIL/uL   Hemoglobin 14.2  13.0 - 17.0 g/dL   HCT 04.5  40.9 - 81.1 %   MCV 93.4  78.0 - 100.0 fL   MCH 31.3  26.0 - 34.0 pg   MCHC 33.5  30.0 - 36.0 g/dL   RDW 91.4  78.2 - 95.6 %   Platelets 300  150 - 400 K/uL     Studies/Results: @RISRSLT24 @  . allopurinol  300 mg Oral QPM  . alvimopan  12 mg Oral BID  . colchicine  0.6 mg Oral Daily  . enoxaparin (LOVENOX) injection  40 mg Subcutaneous Q24H  . ertapenem  1 g Intravenous Once  . hydrochlorothiazide  12.5 mg Oral Daily  . HYDROmorphone PCA 0.3 mg/mL   Intravenous Q4H  . lisinopril  10 mg Oral Daily  . pantoprazole (PROTONIX) IV  40 mg Intravenous Q12H     Assessment/Plan: s/p Procedure(s): LAPAROSCOPIC LOW ANTERIOR RESECTION WITH TAKEDOWN OF SPLENIC FLEXURE.  POD #1. Laparoscopic LOA, laparoscopic splenic flexure takedown,  laparoscopic-assisted low anterior resection with 29 mm EEA stapled anastomosis for presumed diverticulitis. Hinchey class I.  Stable Mobilize and ambulate. Incentive spirometry Sips clear liquids Entereg Check pathology  History C. Difficile colitis (July, 2014). Resolved following treatment.  VTE prophylaxis - on lovenox.  GERD. Somewhat exacerbated and chronic. Will add calcium carbonate tablets as needed and continue Protonix every 12 hours.  Asthma. Continue albuterol  inhalers  Ongoing significant tobacco abuse. No problems with withdrawal. Nicotine patch if needed.  Hypertension. Reasonable control.Continue lisinopril and HCTZ.  Gout.  Recent flare requiring prednisone dosepak.  Continue colchicine and allopurinol.  @PROBHOSP @  LOS: 1 day    Zvi Duplantis M. Derrell Lolling, M.D., [facility] General and Minimally invasive Surgery Breast and Colorectal Surgery Office:   [phone]   07/09/2013  . .prob

## 2013-07-10 DIAGNOSIS — I10 Essential (primary) hypertension: Secondary | ICD-10-CM

## 2013-07-10 MED ORDER — METOPROLOL TARTRATE 1 MG/ML IV SOLN
5.0000 mg | Freq: Four times a day (QID) | INTRAVENOUS | Status: DC
  Administered 2013-07-10 – 2013-07-13 (×9): 5 mg via INTRAVENOUS
  Filled 2013-07-10 (×21): qty 5

## 2013-07-10 MED ORDER — FUROSEMIDE 10 MG/ML IJ SOLN
20.0000 mg | Freq: Once | INTRAMUSCULAR | Status: AC
  Administered 2013-07-10: 20 mg via INTRAVENOUS

## 2013-07-10 MED ORDER — KETOROLAC TROMETHAMINE 30 MG/ML IJ SOLN
30.0000 mg | Freq: Four times a day (QID) | INTRAMUSCULAR | Status: AC
  Administered 2013-07-10 – 2013-07-11 (×6): 30 mg via INTRAVENOUS
  Filled 2013-07-10 (×7): qty 1

## 2013-07-10 NOTE — Progress Notes (Addendum)
2 Days Post-Op  Subjective: Awake and alert. Does not appear ill or toxic. Mental status normal. Did not ambulate yesterday.  Dilaudid PCA dose was increased yesterday because of pain. This has helped. Continues to report some intermittent nausea and reflux symptoms, despite double dose Protonix and TUMS.  The BP borderline, 147/91, 136/86. Heart rate 105 - 115.   O2 saturations 97% on 2 L nasal cannula.  Objective: Vital signs in last 24 hours: Temp:  [98.4 F (36.9 C)-99.9 F (37.7 C)] 98.6 F (37 C) (12/12 0541) Pulse Rate:  [106-114] 113 (12/12 0541) Resp:  [13-20] 14 (12/12 0541) BP: (138-158)/(86-99) 147/91 mmHg (12/12 0541) SpO2:  [94 %-98 %] 96 % (12/12 0541) FiO2 (%):  [97 %] 97 % (12/11 1227) Last BM Date: 07/08/13  Intake/Output from previous day: 12/11 0701 - 12/12 0700 In: 2885 [P.O.:360; I.V.:2525] Out: 2075 [Urine:2075] Intake/Output this shift: Total I/O In: 2645 [P.O.:120; I.V.:2525] Out: 1075 [Urine:1075]   EXAM: General appearance: aalert. Cooperative. Mental status normal. Minimal distress. Looks pretty good, considering. Resp: moving air fairly well both at low lung apices and basis. I don't hear any wheezes or rhonchi. He continues to cough sputum GI: Abdomen large. Soft. Appropriately tender. Portal and distended. Rare bowel sounds. Wound intact. All blood on bandage.  Lab Results:  No results found for this or any previous visit (from the past 24 hour(s)).   Studies/Results: @RISRSLT24 @  . allopurinol  300 mg Oral QPM  . alvimopan  12 mg Oral BID  . colchicine  0.6 mg Oral Daily  . enoxaparin (LOVENOX) injection  40 mg Subcutaneous Q24H  . furosemide  20 mg Intravenous Once  . hydrochlorothiazide  12.5 mg Oral Daily  . HYDROmorphone PCA 0.3 mg/mL   Intravenous Q4H  . ketorolac  30 mg Intravenous Q6H  . lisinopril  10 mg Oral Daily  . metoprolol  5 mg Intravenous Q6H  . pantoprazole (PROTONIX) IV  40 mg Intravenous Q12H      Assessment/Plan: s/p Procedure(s): LAPAROSCOPIC LOW ANTERIOR RESECTION WITH TAKEDOWN OF SPLENIC FLEXURE.  POD #2. Laparoscopic LOA, laparoscopic splenic flexure takedown, laparoscopic-assisted low anterior resection with 29 mm EEA stapled anastomosis for presumed diverticulitis. Hinchey class I.  Stable  Mobilize and ambulate. This was strongly emphasized. DC Foley. Pain control an issue. Has been taking narcotics at home for the past 6 weeks.  We'll add Toradol every 6 hours for 6 doses. Incentive spirometry  Expected ileus. Allow chewing gum.Sips clear liquids  Entereg  Check pathology  History C. Difficile colitis (July, 2014). Resolved following treatment.   VTE prophylaxis - on lovenox.   GERD. Somewhat exacerbated and chronic. Will add calcium carbonate tablets as needed and continue Protonix every 12 hours. Ileus is probably aggravating this.  Asthma. Continue albuterol inhalers.Stable  Ongoing significant tobacco abuse. No problems with withdrawal. Nicotine patch if needed.   Hypertension. Fair control.  Continue lisinopril and HCTZ. We'll add IV Lopressor because of tachycardia . Gout. Recent flare requiring prednisone dosepak. Continue colchicine and allopurinol.   @PROBHOSP @  LOS: 2 days    Vinisha Faxon M 07/10/2013  . .prob

## 2013-07-11 LAB — BASIC METABOLIC PANEL
CO2: 26 mEq/L (ref 19–32)
GFR calc non Af Amer: >90 mL/min (ref >90)
Glucose, Bld: 100 mg/dL — ABNORMAL HIGH (ref 70–99)
Potassium: 3.9 mEq/L (ref 3.5–5.1)
Sodium: 135 mEq/L (ref 135–145)

## 2013-07-11 LAB — CBC
Hemoglobin: 12.5 g/dL — ABNORMAL LOW (ref 13.0–17.0)
Platelets: 272 10*3/uL (ref 150–400)
RBC: 4.11 MIL/uL — ABNORMAL LOW (ref 4.22–5.81)

## 2013-07-11 NOTE — Progress Notes (Signed)
3 Days Post-Op   Assessment: s/p Procedure(s): LAPAROSCOPIC LOW ANTERIOR RESECTION WITH TAKEDOWN OF SPLENIC FLEXURE. Patient Active Problem List   Diagnosis Date Noted  . Diverticulitis large intestine 07/08/2013  . Personal history of colonic polyps 03/10/2013  . Diverticulitis 02/26/2013  . Diverticulosis 02/03/2013  . Abdominal pain, unspecified site 02/03/2013  . Tobacco abuse 12/30/2012  . HTN (hypertension) 11/14/2012  . Gout     Stable, not ready to eat Pain control not optimal  Plan: Keep NPO for now, encouraged ambulation  Subjective: Not passed gas, feels pain control better, sut still not optimal. Has been OOB only to door so far.  Objective: Vital signs in last 24 hours: Temp:  [98.1 F (36.7 C)-99.5 F (37.5 C)] 98.6 F (37 C) (12/13 0500) Pulse Rate:  [77-97] 86 (12/13 0500) Resp:  [11-20] 11 (12/13 0800) BP: (106-137)/(68-92) 137/89 mmHg (12/13 0500) SpO2:  [95 %-100 %] 96 % (12/13 0800)   Intake/Output from previous day: 12/12 0701 - 12/13 0700 In: 2300.4 [P.O.:480; I.V.:1820.4] Out: 475 [Urine:475]  General appearance: alert, cooperative and no distress Resp: clear to auscultation bilaterally GI: Distended, soft, tender around incision, no BS  Incision: Wicks still wet, no infection apparent  Lab Results:   Recent Labs  07/09/13 0345 07/11/13 0515  WBC 15.2* 12.1*  HGB 14.2 12.5*  HCT 42.4 38.4*  PLT 300 272   BMET  Recent Labs  07/09/13 0345 07/11/13 0515  NA 136 135  K 3.8 3.9  CL 99 101  CO2 27 26  GLUCOSE 100* 100*  BUN 6 7  CREATININE 0.69 0.63  CALCIUM 8.8 8.7    MEDS, Scheduled . allopurinol  300 mg Oral QPM  . alvimopan  12 mg Oral BID  . colchicine  0.6 mg Oral Daily  . enoxaparin (LOVENOX) injection  40 mg Subcutaneous Q24H  . hydrochlorothiazide  12.5 mg Oral Daily  . HYDROmorphone PCA 0.3 mg/mL   Intravenous Q4H  . ketorolac  30 mg Intravenous Q6H  . lisinopril  10 mg Oral Daily  . metoprolol  5 mg  Intravenous Q6H  . pantoprazole (PROTONIX) IV  40 mg Intravenous Q12H    Studies/Results: No results found.    LOS: 3 days     Currie Paris, MD, [facility], [place] [phone]   07/11/2013 9:03 AM

## 2013-07-12 MED ORDER — PANTOPRAZOLE SODIUM 40 MG PO TBEC
40.0000 mg | DELAYED_RELEASE_TABLET | Freq: Two times a day (BID) | ORAL | Status: DC
  Administered 2013-07-12 – 2013-07-13 (×2): 40 mg via ORAL
  Filled 2013-07-12 (×2): qty 1

## 2013-07-12 NOTE — Progress Notes (Signed)
4 Days Post-Op   Assessment: s/p Procedure(s): LAPAROSCOPIC LOW ANTERIOR RESECTION WITH TAKEDOWN OF SPLENIC FLEXURE. Patient Active Problem List   Diagnosis Date Noted  . Diverticulitis large intestine 07/08/2013  . Personal history of colonic polyps 03/10/2013  . Diverticulitis 02/26/2013  . Diverticulosis 02/03/2013  . Abdominal pain, unspecified site 02/03/2013  . Tobacco abuse 12/30/2012  . HTN (hypertension) 11/14/2012  . Gout     Improving  Plan: Advance diet Will slow IV a bit  Subjective: Feels better today, taking clears, voiding OK with foley out, no nausea, pain better controlled, having BMs and passing "loads" of gas  Objective: Vital signs in last 24 hours: Temp:  [97.1 F (36.2 C)-100.2 F (37.9 C)] 97.1 F (36.2 C) (12/14 0559) Pulse Rate:  [70-90] 87 (12/14 0559) Resp:  [13-22] 13 (12/14 0810) BP: (113-125)/(64-88) 113/64 mmHg (12/14 0559) SpO2:  [96 %-100 %] 98 % (12/14 0810) FiO2 (%):  [98 %] 98 % (12/14 0810)   Intake/Output from previous day: 12/13 0701 - 12/14 0700 In: 2622.5 [P.O.:760; I.V.:1862.5] Out: 2501 [Urine:2500; Stool:1]  General appearance: alert, cooperative and no distress Resp: clear to auscultation bilaterally Cardio: regular rate and rhythm, S1, S2 normal, no murmur, click, rub or gallop GI: Still a bit distended but softer than yesterday and not tender except at incision. BS+  Incision: Wicks still a bit moist, left in situ  Lab Results:   Recent Labs  07/11/13 0515  WBC 12.1*  HGB 12.5*  HCT 38.4*  PLT 272   BMET  Recent Labs  07/11/13 0515  NA 135  K 3.9  CL 101  CO2 26  GLUCOSE 100*  BUN 7  CREATININE 0.63  CALCIUM 8.7    MEDS, Scheduled . allopurinol  300 mg Oral QPM  . alvimopan  12 mg Oral BID  . colchicine  0.6 mg Oral Daily  . enoxaparin (LOVENOX) injection  40 mg Subcutaneous Q24H  . hydrochlorothiazide  12.5 mg Oral Daily  . HYDROmorphone PCA 0.3 mg/mL   Intravenous Q4H  . lisinopril  10  mg Oral Daily  . metoprolol  5 mg Intravenous Q6H  . pantoprazole (PROTONIX) IV  40 mg Intravenous Q12H    Studies/Results: No results found.    LOS: 4 days     Currie Paris, MD, [facility], [place] [phone]   07/12/2013 9:11 AM    .

## 2013-07-13 ENCOUNTER — Telehealth (INDEPENDENT_AMBULATORY_CARE_PROVIDER_SITE_OTHER): Payer: Self-pay | Admitting: Surgery

## 2013-07-13 DIAGNOSIS — K5732 Diverticulitis of large intestine without perforation or abscess without bleeding: Secondary | ICD-10-CM

## 2013-07-13 MED ORDER — ALBUTEROL SULFATE HFA 108 (90 BASE) MCG/ACT IN AERS
2.0000 | INHALATION_SPRAY | Freq: Four times a day (QID) | RESPIRATORY_TRACT | Status: DC
  Filled 2013-07-13 (×2): qty 6.7

## 2013-07-13 MED ORDER — FENTANYL CITRATE 0.05 MG/ML IJ SOLN: 25.0000 ug | INTRAMUSCULAR | Status: DC | PRN

## 2013-07-13 MED ORDER — METOPROLOL TARTRATE 1 MG/ML IV SOLN: 5.0000 mg | Freq: Four times a day (QID) | INTRAVENOUS | Status: DC | PRN

## 2013-07-13 MED ORDER — OXYCODONE-ACETAMINOPHEN 5-325 MG PO TABS
1.0000 | ORAL_TABLET | ORAL | Status: DC | PRN
  Administered 2013-07-13: 2 via ORAL
  Filled 2013-07-13: qty 2

## 2013-07-13 MED ORDER — OXYCODONE-ACETAMINOPHEN 7.5-325 MG PO TABS: 1.0000 | ORAL_TABLET | ORAL | Status: DC | PRN

## 2013-07-13 NOTE — Progress Notes (Signed)
Patient discharged to home with instructions. 

## 2013-07-13 NOTE — Discharge Summary (Addendum)
Patient ID: Preston [name] [id number] 42 y.o. 05-11-1971  Admit date: 07/08/2013  Discharge date and time: 07/13/2013  Admitting Physician: Ernestene Mention  Discharge Physician: Ernestene Mention  Admission Diagnoses: diverticulitis  Discharge Diagnoses: Diverticulitis Ongoing tobacco abuse Doubt Asthma Hypertension Panic attacks Obesity GERD History C. Difficile colitis, July 2014, resolved following treatment  Operations: Procedure(s): LAPAROSCOPIC LOW ANTERIOR RESECTION WITH TAKEDOWN OF SPLENIC FLEXURE.  Admission Condition: good  Discharged Condition: good  Indication for Admission: Preston [name] is a 42 y.o. male. He has been evaluated on numerous occasions for presumed sigmoid diverticulitis, both as inpatient and outpatient.. CT scan on October 16 shows stable thickening of the sigmoid colon, consistent with colitis. No abscess or obstruction noted.He continues to require  narcotic medication because of left lower quadrant pain.  I've discussed his care with Dr. Dulce Sellar. Neither of [place] is certain as to the cause of his sigmoid colitis.  Colonoscopy performed by Dr. Dulce Sellar did not look that bad. There was no obstruction. No diverticular were seen. There was no stricture. No malignancy. No foreign body.There was a little bit of edema of the sigmoid. 2 descending colon polyps were removed .[name] Colonoscopy went all the way to the cecum.  Previously ,his patient was initially hospitalized in April for acute sigmoid diverticulitis, possible perforation with small 2.7 cm abscess. He improved but the pain never completely went away in the left lower quadrant. He was hospitalized again from June 2-5, 2014. CT scan showed that he had acute diverticulitis and the abscess had essentially resolved. He was discharged once again. He was hospitalized from February 26, 2013 through 03/01/2013 and was diagnosed at with C. Difficile colitis. He was discharged home on Cipro and Flagyl. .. He is  eating normal food. Trying to avoid seeds and nuts as he was advised.  .  Dr. Randa Evens performed a flexible sigmoidoscopy on August 2 while he was in the hospital. It showed minimally inflamed mucosa, no pseudomembranes despite PCR positive C.Diff. This was at 25-35 cm. There were only a few diverticula.  CT scan during that hospitalization showed inflamed colon but no abscess. Biopsy of the colon showed minimal colitis.  He continues to have left lower quadrant pain requiring narcotic medication. He is having bowel movements without blood. He is gaining weight with a BMI of 32. Because of continued left lower quadrant pain and CT findings suggesting thickening and chronic colitis of the sigmoid colon, he is brought to the operating room electively following a bowel prep.  Past history is significant for hypertension, obesity, current tobacco use, appendectomy, panic attacks, gout.   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 Drinks alcohol    Hospital Course: On the day of admission the patient was taken to the operating room. He went underwent a laparoscopic lysis of adhesions, laparoscopic takedown of splenic flexure, and laparoscopic-assisted low anterior resection with a 29 mm EEA stapling technique.  Operative findings were large man with a very large fatty omentum. Sigmoid colon notable for chronic thickening and inflammation, almost rock hard and densely adherent to the left lateral pelvic sidewall requiring sharp and blunt dissection to get it free. The proximal colon and the rectum were actually quite soft, with good vascularity and no muscular hypertrophy. The liver and stomach and spleen looks normal. The small bowel looked normal. Anastomosis created from the distal descending colon to the mid rectum with a 29 mm EEA stapling device. Anastomosis was several centimeters above  the peritoneal reflection.   The patient remained stable postop. He did have problems with  pain control which required several adjustments but ultimately pain came under control. He has some problems with nausea and reflux symptoms but that resolved as well. Ultimately he became ambulatory, had no trouble voiding, and began having bowel movements. We advanced his diet. On the day of discharge the patient had 2 bowel movements in the morning and called me and asked if he could go home. Do not want to stay in the hospital any longer.  On the day of discharge his wound looked pretty good. There were a few staples and the skin was left open between the staples. There is no redness or cellulitis. The abdomen was soft. He was afebrile and there was no tachycardia. Blood pressure was under good control. Asthma seemed under good control.  Diet and activities were discussed. I pursued to see his primary care physician this week to review an2d manage his hypertension, gout, panic attacks, asthma, and tobacco abuse. He will return to see me in one week for wound check and staple removal He was given a prescription for Percocet, 30 tablets. He was to continue his usual medications.  Consults: None  Significant Diagnostic Studies: Laboratory testing blood work, surgical pathology  Treatments: surgery: Laparoscopic-assisted takedown of splenic flexure and low anterior resection.  Disposition: Home  Patient Instructions:    Medication List         albuterol 108 (90 BASE) MCG/ACT inhaler  Commonly known as:  PROVENTIL HFA;VENTOLIN HFA  Inhale 2 puffs into the lungs every 6 (six) hours as needed for wheezing or shortness of breath.     allopurinol 300 MG tablet  Commonly known as:  ZYLOPRIM  Take 300 mg by mouth every evening.     ANTI-NAUSEA PO  Take 5.5 mLs by mouth daily as needed (for nausea).     colchicine 0.6 MG tablet  Take 0.6 mg by mouth daily.     diazepam 5 MG tablet  Commonly known as:  VALIUM  Take 5 mg by mouth daily as needed for anxiety.     indomethacin 25 MG  capsule  Commonly known as:  INDOCIN  Take 25 mg by mouth 3 (three) times daily with meals. For gout flare up.     lisinopril-hydrochlorothiazide 10-12.5 MG per tablet  Commonly known as:  PRINZIDE,ZESTORETIC  Take 1 tablet by mouth daily.     omeprazole 20 MG tablet  Commonly known as:  PRILOSEC OTC  Take 20 mg by mouth 2 (two) times daily.     oxyCODONE-acetaminophen 10-325 MG per tablet  Commonly known as:  PERCOCET  Take 1 tablet by mouth every 6 (six) hours as needed for pain.     oxyCODONE-acetaminophen 7.5-325 MG per tablet  Commonly known as:  PERCOCET  Take 1 tablet by mouth every 4 (four) hours as needed for pain.     PROBIOTIC PO  Take 1 capsule by mouth daily.        Activity: no heavy lifting for 6 weeks Diet: low fat, low cholesterol diet Wound Care: as directed  Follow-up:  With Dr. Derrell Lolling in 1 week.  Signed: Angelia Mould. Derrell Lolling, M.D., FACS General and minimally invasive surgery Breast and Colorectal Surgery  07/13/2013, 1:25 PM

## 2013-07-13 NOTE — Progress Notes (Signed)
5 Days Post-Op  Subjective: Patient and nurse note he is a little dizzy when he stands. Think that he may be using too much narcotic. Voiding uneventfully. Passing some flatus. Had one bowel movement. Tolerating full liquids without nausea. Minimal complaints of reflux.  Denies shortness of breath.  Pathology report shows diverticulitis, no malignancy. I discussed this with the patient.  Objective: Vital signs in last 24 hours: Temp:  [97.1 F (36.2 C)-99 F (37.2 C)] 98.2 F (36.8 C) (12/15 0204) Pulse Rate:  [78-90] 78 (12/15 0204) Resp:  [12-17] 17 (12/15 0204) BP: (112-126)/(64-86) 121/80 mmHg (12/15 0204) SpO2:  [96 %-100 %] 98 % (12/15 0204) FiO2 (%):  [96 %-98 %] 97 % (12/14 1644) Last BM Date: 07/12/13  Intake/Output from previous day: 12/14 0701 - 12/15 0700 In: 1398.3 [P.O.:120; I.V.:1278.3] Out: 3075 [Urine:3075] Intake/Output this shift: Total I/O In: 750 [I.V.:750] Out: 1300 [Urine:1300]    EXAM: General appearance: awake and alert. And in no distress. Slightly hyperactive. Otherwise mental status normal. Resp: some faint wheezing bilaterally, clears fairly well with cough. GI: abdomen soft. Obese. Hypoactive bowel sounds. Doesn't really appear distended. Lower midline wound shows healthy tissue in gaps between staples. Wicks have been removed previously. Serosanguineous drainage. No cellulitis or sign of infection.  Lab Results:  No results found for this or any previous visit (from the past 24 hour(s)).   Studies/Results: @RISRSLT24 @  . albuterol  2 puff Inhalation Q6H  . allopurinol  300 mg Oral QPM  . colchicine  0.6 mg Oral Daily  . enoxaparin (LOVENOX) injection  40 mg Subcutaneous Q24H  . hydrochlorothiazide  12.5 mg Oral Daily  . lisinopril  10 mg Oral Daily  . pantoprazole  40 mg Oral BID AC     Assessment/Plan: s/p Procedure(s): LAPAROSCOPIC LOW ANTERIOR RESECTION WITH TAKEDOWN OF SPLENIC FLEXURE.  2 Days Post-Op  Subjective:  Awake and  alert. Does not appear ill or toxic. Mental status normal. Did not ambulate yesterday.  Dilaudid PCA dose was increased yesterday because of pain. This has helped. Continues to report some intermittent nausea and reflux symptoms, despite double dose Protonix and TUMS.  The BP borderline, 147/91, 136/86. Heart rate 105 - 115. O2 saturations 97% on 2 L nasal cannula.  Objective:  Vital signs in last 24 hours:  Temp: [98.4 F (36.9 C)-99.9 F (37.7 C)] 98.6 F (37 C) (12/12 0541)  Pulse Rate: [106-114] 113 (12/12 0541)  Resp: [13-20] 14 (12/12 0541)  BP: (138-158)/(86-99) 147/91 mmHg (12/12 0541)  SpO2: [94 %-98 %] 96 % (12/12 0541)  FiO2 (%): [97 %] 97 % (12/11 1227)  Last BM Date: 07/08/13  Intake/Output from previous day:  12/11 0701 - 12/12 0700  In: 2885 [P.O.:360; I.V.:2525]  Out: 2075 [Urine:2075]  Intake/Output this shift:  Total I/O  In: 2645 [P.O.:120; I.V.:2525]  Out: 1075 [Urine:1075]  EXAM:  General appearance: aalert. Cooperative. Mental status normal. Minimal distress. Looks pretty good, considering.  Resp: moving air fairly well both at low lung apices and basis. I don't hear any wheezes or rhonchi. He continues to cough sputum  GI: Abdomen large. Soft. Appropriately tender. Portal and distended. Rare bowel sounds. Wound intact. All blood on bandage.  Lab Results:  No results found for this or any previous visit (from the past 24 hour(s)).  Studies/Results:  @RISRSLT24 @    .  allopurinol  300 mg  Oral  QPM    .  alvimopan  12 mg  Oral  BID    .  colchicine  0.6 mg  Oral  Daily    .  enoxaparin (LOVENOX) injection  40 mg  Subcutaneous  Q24H    .  furosemide  20 mg  Intravenous  Once    .  hydrochlorothiazide  12.5 mg  Oral  Daily    .  HYDROmorphone PCA 0.3 mg/mL   Intravenous  Q4H    .  ketorolac  30 mg  Intravenous  Q6H    .  lisinopril  10 mg  Oral  Daily    .  metoprolol  5 mg  Intravenous  Q6H    .  pantoprazole (PROTONIX) IV  40 mg  Intravenous  Q12H      Assessment/Plan:  s/p Procedure(s):  LAPAROSCOPIC LOW ANTERIOR RESECTION WITH TAKEDOWN OF SPLENIC FLEXURE.  POD #5. Laparoscopic LOA, laparoscopic splenic flexure takedown, laparoscopic-assisted low anterior resection with 29 mm EEA stapled anastomosis for diverticulitis.   Initial Hinchey class I.  Stable Ileus resolving Advanced to low-residue diet Discontinue PCA. Reduced narcotic.Percocet when necessary. Fentanyl when necessary as rescue dose.   History C. Difficile colitis (July, 2014). Resolved following treatment.   VTE prophylaxis - on lovenox.   GERD. better   Asthma. Will change inhalers from when necessary 2 every 6 hours due to wheezing.  Ongoing significant tobacco abuse. No problems with withdrawal. Nicotine patch if needed.   Hypertension. Good control now. Continue lisinopril and HCTZ. Taper lopressor. .       @PROBHOSP @  LOS: 5 days    Alon Mazor M 07/13/2013  . .prob

## 2013-07-13 NOTE — Telephone Encounter (Signed)
Received a telephone call from patient's wife. Moderate serosanguineous drainage from abdominal incision upon arriving home from the hospital today. Patient's wife instructed to change his dressings. Patient instructed to remain at bedrest with limited ambulation tonight. If drainage continues or patient clinically deteriorates, they are to come to the emergency department for evaluation. Otherwise I will have the patient's surgeon or his nurse contact them in the morning to check on his progress.  Velora Heckler, MD, [facility] Office: [phone]

## 2013-07-14 NOTE — Telephone Encounter (Signed)
Spoke to Dr. Derrell Lolling who approved appt for 07/20/13 to arrive @ 445p.  Patient and wife updated at this time.

## 2013-07-14 NOTE — Telephone Encounter (Signed)
Wife called and stated patient continues to have clear drainage.  Explained that this can be normal and explained the signs to watch for.  Wife states understanding and agreeable at this time.  Wife asking about f/u appt.  She states they were told to come in Monday to see Dr. Derrell Lolling.  I explained that I don't see anything however Dr. Derrell Lolling will be here this afternoon so we will ask him and then let them know.  Wife states understanding and agreeable.

## 2013-07-20 ENCOUNTER — Ambulatory Visit (INDEPENDENT_AMBULATORY_CARE_PROVIDER_SITE_OTHER): Payer: Medicaid Other | Admitting: General Surgery

## 2013-07-20 ENCOUNTER — Encounter (INDEPENDENT_AMBULATORY_CARE_PROVIDER_SITE_OTHER): Payer: Self-pay | Admitting: General Surgery

## 2013-07-20 VITALS — BP 152/106 | HR 84 | Temp 97.6°F | Resp 16 | Ht 74.5 in | Wt 248.4 lb

## 2013-07-20 DIAGNOSIS — K5732 Diverticulitis of large intestine without perforation or abscess without bleeding: Secondary | ICD-10-CM

## 2013-07-20 DIAGNOSIS — K5792 Diverticulitis of intestine, part unspecified, without perforation or abscess without bleeding: Secondary | ICD-10-CM

## 2013-07-20 NOTE — Patient Instructions (Signed)
You appear to be recovering from your sigmoid colon resection without any obvious surgical complications. The pains and  burning discomfort should slowly resolve.  Continue to drink lots of fluids every day, and follow a low fat, high fiber diet.  You need to make an appointment with your primary care physician immediately for management of your blood pressure and for smoking cessation program.  You may return to work without restriction after 08/30/2013.  Increase your exercise until you are able to walk 2 miles a day.  Continue to shower and place a clean bandage on the wound until it completely heals  Return to see Dr. Derrell Lolling in one month.

## 2013-07-20 NOTE — Progress Notes (Signed)
Patient ID: Preston [name], male   DOB: [birth year], 42 y.o.   MRN: [id number]  History: This 42 year old gentleman with tobacco abuse, obesity, hypertension, gout, and asthma returns for a postop visit. On 07/08/2013 he underwent laparoscopic lysis of adhesions with takedown splenic flexure, laparoscopic-assisted low anterior resection with 29 mm EEA stapled anastomosis. Final pathology showed diverticulitis. He says he is tolerating a regular diet and having to reasonably formed stools per day. He says his left Lower quadrant pain has resolved, but he has some mild burning, neuropathic pain in the right lower quadrant. He is changing the bandage once a day. No fevers. He says he feels better than preop. He says he's cut back to smoking less than one half pack per day. He has not made an appointment with his primary care physician yet.  Exam: Patient looks well. Alert. Mental status normal. Friendly. In no distress. Temp 97.6. Pulse 84. BP 152/106. weight 248  Abd:  wounds healing without signs of infection. Staples removed. Redressed lower midline wound beause of some intentional gaps between the staples.  Assessment: Sigmoid diverticulitis, recovering uneventfully in the early postop period following low anterior resection with stapled anastomosis History superimposed Clostridium difficile colitis, resolved( prior hospitalization) Gout Tobacco abuse Obesity Hypertension Asthma  Plan: Diet & activities were discussed. Increase walking. No heavy lifting until after Feb. 1, 2015 He was strongly urged to see his PCP for management of his hypertension and smoking cessation point program midline return to work after without restrictions after Feb. 1,2014 Wound care discussed The diet discussed Return to see me in one month.   Angelia Mould. Derrell Lolling, M.D., [facility] General and Minimally invasive Surgery Breast and Colorectal Surgery Office:   [phone] Pager:    [phone]

## 2013-08-05 ENCOUNTER — Encounter: Payer: Self-pay | Admitting: Internal Medicine

## 2013-08-05 ENCOUNTER — Ambulatory Visit: Payer: Medicaid Other | Attending: Internal Medicine | Admitting: Internal Medicine

## 2013-08-05 ENCOUNTER — Other Ambulatory Visit: Payer: Self-pay | Admitting: Internal Medicine

## 2013-08-05 VITALS — BP 172/122 | HR 102 | Temp 98.7°F | Resp 14 | Ht 74.0 in | Wt 250.6 lb

## 2013-08-05 DIAGNOSIS — G8929 Other chronic pain: Secondary | ICD-10-CM | POA: Insufficient documentation

## 2013-08-05 DIAGNOSIS — M25559 Pain in unspecified hip: Secondary | ICD-10-CM | POA: Insufficient documentation

## 2013-08-05 DIAGNOSIS — I10 Essential (primary) hypertension: Secondary | ICD-10-CM | POA: Insufficient documentation

## 2013-08-05 DIAGNOSIS — M549 Dorsalgia, unspecified: Secondary | ICD-10-CM | POA: Insufficient documentation

## 2013-08-05 LAB — LIPID PANEL
Cholesterol: 247 mg/dL — ABNORMAL HIGH (ref 0–200)
HDL: 42 mg/dL (ref >39)
LDL CALC: 134 mg/dL — AB (ref 0–99)
TRIGLYCERIDES: 356 mg/dL — AB (ref <150)
Total CHOL/HDL Ratio: 5.9 Ratio
VLDL: 71 mg/dL — ABNORMAL HIGH (ref 0–40)

## 2013-08-05 LAB — COMPLETE METABOLIC PANEL WITH GFR
ALT: 41 U/L (ref 0–53)
AST: 18 U/L (ref 0–37)
Albumin: 5 g/dL (ref 3.5–5.2)
Alkaline Phosphatase: 56 U/L (ref 39–117)
BILIRUBIN TOTAL: 0.3 mg/dL (ref 0.3–1.2)
BUN: 9 mg/dL (ref 6–23)
CO2: 27 meq/L (ref 19–32)
Calcium: 10.4 mg/dL (ref 8.4–10.5)
Chloride: 100 mEq/L (ref 96–112)
Creat: 0.67 mg/dL (ref 0.50–1.35)
Glucose, Bld: 94 mg/dL (ref 70–99)
Potassium: 4.6 mEq/L (ref 3.5–5.3)
Sodium: 139 mEq/L (ref 135–145)
Total Protein: 7.4 g/dL (ref 6.0–8.3)

## 2013-08-05 LAB — CBC WITH DIFFERENTIAL/PLATELET
Basophils Absolute: 0 10*3/uL (ref 0.0–0.1)
Basophils Relative: 0 % (ref 0–1)
Eosinophils Absolute: 0.3 10*3/uL (ref 0.0–0.7)
Eosinophils Relative: 3 % (ref 0–5)
HCT: 43.6 % (ref 39.0–52.0)
HEMOGLOBIN: 15.4 g/dL (ref 13.0–17.0)
LYMPHS ABS: 2.2 10*3/uL (ref 0.7–4.0)
Lymphocytes Relative: 22 % (ref 12–46)
MCH: 30 pg (ref 26.0–34.0)
MCHC: 35.3 g/dL (ref 30.0–36.0)
MCV: 85 fL (ref 78.0–100.0)
MONOS PCT: 6 % (ref 3–12)
Monocytes Absolute: 0.7 10*3/uL (ref 0.1–1.0)
NEUTROS ABS: 7.1 10*3/uL (ref 1.7–7.7)
NEUTROS PCT: 69 % (ref 43–77)
Platelets: 347 10*3/uL (ref 150–400)
RBC: 5.13 MIL/uL (ref 4.22–5.81)
RDW: 13.4 % (ref 11.5–15.5)
WBC: 10.3 10*3/uL (ref 4.0–10.5)

## 2013-08-05 LAB — URIC ACID: URIC ACID, SERUM: 5.2 mg/dL (ref 4.0–7.8)

## 2013-08-05 LAB — TSH: TSH: 1.295 u[IU]/mL (ref 0.350–4.500)

## 2013-08-05 LAB — POCT GLYCOSYLATED HEMOGLOBIN (HGB A1C): Hemoglobin A1C: 5.6

## 2013-08-05 MED ORDER — HYDROCHLOROTHIAZIDE 25 MG PO TABS: 25.0000 mg | ORAL_TABLET | Freq: Every day | ORAL | Status: DC

## 2013-08-05 MED ORDER — INDOMETHACIN 25 MG PO CAPS: 25.0000 mg | ORAL_CAPSULE | Freq: Three times a day (TID) | ORAL | Status: DC | PRN

## 2013-08-05 MED ORDER — ALBUTEROL SULFATE HFA 108 (90 BASE) MCG/ACT IN AERS: 2.0000 | INHALATION_SPRAY | Freq: Four times a day (QID) | RESPIRATORY_TRACT | Status: DC | PRN

## 2013-08-05 MED ORDER — BUPROPION HCL ER (SR) 150 MG PO TB12: 150.0000 mg | ORAL_TABLET | Freq: Two times a day (BID) | ORAL | Status: DC

## 2013-08-05 MED ORDER — ALLOPURINOL 300 MG PO TABS: 300.0000 mg | ORAL_TABLET | Freq: Every evening | ORAL | Status: DC

## 2013-08-05 MED ORDER — LISINOPRIL 40 MG PO TABS: 40.0000 mg | ORAL_TABLET | Freq: Every day | ORAL | Status: DC

## 2013-08-05 NOTE — Progress Notes (Signed)
Pt is here for a BP check and gout in Lt hip. May have a pulled muscle in middle of the back. BP today is 172/122. Complains of headaches, nausea and vomiting x3 days; pain scale on a level 6 today.

## 2013-08-05 NOTE — Progress Notes (Signed)
Patient ID: Preston [name], male   DOB: 10/03/1970, 43 y.o.   MRN: [id number]   CC:  HPI: 43 year old male who is here for evaluation of his blood pressure, he had a low anterior resection of his colon on 12/14, for recurrent diverticulitis. He also developed C. difficile colitis last year. He continues to complain of low back pain and bilateral hip pain, he also has a history of hypertension but no chest pain or shortness of breath, occasionally sees swelling in his legs. Apparently also has gout and was seen by Dr. Ninfa Linden in the hospital. Treated with prednisone and allopurinol for presumed gout.  Patient is also on Valium for anxiety and Percocet for chronic pain, requesting refills    Allergies  Allergen Reactions  . Morphine And Related Itching, Nausea And Vomiting and Other (See Comments)    "bad feeling"   Past Medical History  Diagnosis Date  . Gout   . Diverticulitis   . GERD (gastroesophageal reflux disease)   . Hypertension     Does not see a cardiologist  . Anxiety   . Panic attacks   . Bronchitis   . Asthma     reports related to anxiety  . History of Clostridium difficile infection    Current Outpatient Prescriptions on File Prior to Visit  Medication Sig Dispense Refill  . diazepam (VALIUM) 5 MG tablet Take 5 mg by mouth daily as needed for anxiety.      [name] omeprazole (PRILOSEC OTC) 20 MG tablet Take 20 mg by mouth 2 (two) times daily.      . colchicine 0.6 MG tablet Take 0.6 mg by mouth daily.      . Fructose-Dextrose-Phosphor Acd (ANTI-NAUSEA PO) Take 5.5 mLs by mouth daily as needed (for nausea).      [name] oxyCODONE-acetaminophen (PERCOCET) 10-325 MG per tablet Take 1 tablet by mouth every 6 (six) hours as needed for pain.  30 tablet  0  . oxyCODONE-acetaminophen (PERCOCET) 7.5-325 MG per tablet Take 1 tablet by mouth every 4 (four) hours as needed for pain.  30 tablet  0  . Probiotic Product (PROBIOTIC PO) Take 1 capsule by mouth daily.       No current  facility-administered medications on file prior to visit.   Family History  Problem Relation Age of Onset  . Cancer Father     lymphoma   History   Social History  . Marital Status: Single    Spouse Name: N/A    Number of Children: N/A  . Years of Education: N/A   Occupational History  . Not on file.   Social History Main Topics  . Smoking status: Current Every Day Smoker -- 0.75 packs/day for 29 years    Types: Cigarettes  . Smokeless tobacco: Never Used  . Alcohol Use: No     Comment: drank everyday until 6 months ago and none for 1 month,   . Drug Use: No  . Sexual Activity: Yes   Other Topics Concern  . Not on file   Social History Narrative  . No narrative on file    Review of Systems  Constitutional: As in history of present illness iaphoresis, activity change, appetite change and fatigue.  HENT: Negative for ear pain, nosebleeds, congestion, facial swelling, rhinorrhea, neck pain, neck stiffness and ear discharge.   Eyes: Negative for pain, discharge, redness, itching and visual disturbance.  Respiratory: Negative for cough, choking, chest tightness, shortness of breath, wheezing and stridor.   Cardiovascular: Negative  for chest pain, palpitations and leg swelling.  Gastrointestinal: Negative for abdominal distention.  Genitourinary: Negative for dysuria, urgency, frequency, hematuria, flank pain, decreased urine volume, difficulty urinating and dyspareunia.  Musculoskeletal: Negative for back pain, joint swelling, arthralgias and gait problem.  Neurological: Negative for dizziness, tremors, seizures, syncope, facial asymmetry, speech difficulty, weakness, light-headedness, numbness and headaches.  Hematological: Negative for adenopathy. Does not bruise/bleed easily.  Psychiatric/Behavioral: Negative for hallucinations, behavioral problems, confusion, dysphoric mood, decreased concentration and agitation.    Objective:   Filed Vitals:   08/05/13 1011  BP:  172/122  Pulse: 102  Temp: 98.7 F (37.1 C)  Resp: 14    Physical Exam  Constitutional: Appears well-developed and well-nourished. No distress.  HENT: Normocephalic. External right and left ear normal. Oropharynx is clear and moist.  Eyes: Conjunctivae and EOM are normal. PERRLA, no scleral icterus.  Neck: Normal ROM. Neck supple. No JVD. No tracheal deviation. No thyromegaly.  CVS: RRR, S1/S2 +, no murmurs, no gallops, no carotid bruit.  Pulmonary: Effort and breath sounds normal, no stridor, rhonchi, wheezes, rales.  Abdominal: Soft. BS +,  no distension, tenderness, rebound or guarding.  Musculoskeletal: Normal range of motion. No edema and no tenderness.  Lymphadenopathy: No lymphadenopathy noted, cervical, inguinal. Neuro: Alert. Normal reflexes, muscle tone coordination. No cranial nerve deficit. Skin: Skin is warm and dry. No rash noted. Not diaphoretic. No erythema. No pallor.  Psychiatric: Normal mood and affect. Behavior, judgment, thought content normal.   Lab Results  Component Value Date   WBC 12.1* 07/11/2013   HGB 12.5* 07/11/2013   HCT 38.4* 07/11/2013   MCV 93.4 07/11/2013   PLT 272 07/11/2013   Lab Results  Component Value Date   CREATININE 0.63 07/11/2013   BUN 7 07/11/2013   NA 135 07/11/2013   K 3.9 07/11/2013   CL 101 07/11/2013   CO2 26 07/11/2013    No results found for this basename: HGBA1C   Lipid Panel  No results found for this basename: chol, trig, hdl, cholhdl, vldl, ldlcalc       Assessment and plan:   Patient Active Problem List   Diagnosis Date Noted  . Diverticulitis large intestine 07/08/2013  . Personal history of colonic polyps 03/10/2013  . Diverticulitis 02/26/2013  . Diverticulosis 02/03/2013  . Abdominal pain, unspecified site 02/03/2013  . Tobacco abuse 12/30/2012  . HTN (hypertension) 11/14/2012  . Gout        Hypertension Increase lisinopril to 40 HCTZ 25 mg Renal function panel  Chronic back pain, bilateral  hip pain Secondary to gout?, Check uric acid level Continue allopurinol Orthopedic referral MRI of the thoracic and lumbar spine   Chronic pain Refer to pain clinic  Chronic anxiety Referral to psychiatry  Follow up in 3 months   The patient was given clear instructions to go to ER or return to medical center if symptoms don't improve, worsen or new problems develop. The patient verbalized understanding. The patient was told to call to get any lab results if not heard anything in the next week.

## 2013-08-06 MED ORDER — SIMVASTATIN 40 MG PO TABS: 40.0000 mg | ORAL_TABLET | Freq: Every day | ORAL | Status: DC

## 2013-08-06 NOTE — Addendum Note (Signed)
Addended by: Allyson Sabal MD, [facility] on: 08/06/2013 05:05 PM   Modules accepted: Orders

## 2013-08-07 ENCOUNTER — Telehealth: Payer: Self-pay | Admitting: *Deleted

## 2013-08-07 NOTE — Telephone Encounter (Signed)
Message copied by Haron Beilke, [place] R on Fri Aug 07, 2013 10:53 AM ------      Message from: Allyson Sabal MD, [facility]      Created: Thu Aug 06, 2013  5:06 PM       His notify patient of the patient's triglycerides and LDL are elevated, the patient has been described simvastatin, prescription called into his pharmacy ------

## 2013-08-07 NOTE — Telephone Encounter (Signed)
Left a voicemail for pt to give us a call back. 

## 2013-08-11 LAB — DRUGS OF ABUSE SCREEN W/O ALC, ROUTINE URINE
AMPHETAMINE SCRN UR: NEGATIVE
BARBITURATE QUANT UR: NEGATIVE
Benzodiazepines.: NEGATIVE
COCAINE METABOLITES: NEGATIVE
CREATININE, U: 138.5 mg/dL
Marijuana Metabolite: POSITIVE — AB
Methadone: NEGATIVE
Opiate Screen, Urine: POSITIVE — AB
PHENCYCLIDINE (PCP): NEGATIVE
Propoxyphene: NEGATIVE

## 2013-08-12 LAB — OPIATES/OPIOIDS (LC/MS-MS)
CODEINE URINE: NEGATIVE ng/mL
HYDROCODONE: 657 ng/mL — AB
HYDROMORPHONE: 116 ng/mL — AB
Heroin (6-AM), UR: NEGATIVE ng/mL
Morphine Urine: NEGATIVE ng/mL
NORHYDROCODONE, UR: 896 ng/mL — AB
NOROXYCODONE, UR: NEGATIVE ng/mL
Oxycodone, ur: NEGATIVE ng/mL
Oxymorphone: NEGATIVE ng/mL

## 2013-08-12 LAB — CANNABANOIDS (GC/LC/MS), URINE: THC-COOH (GC/LC/MS), ur confirm: 87 ng/mL — ABNORMAL HIGH

## 2013-08-19 ENCOUNTER — Ambulatory Visit (HOSPITAL_COMMUNITY): Payer: Medicaid Other

## 2013-08-19 ENCOUNTER — Ambulatory Visit (HOSPITAL_COMMUNITY)
Admission: RE | Admit: 2013-08-19 | Discharge: 2013-08-19 | Disposition: A | Discharge status: Home / Self Care | Payer: Medicaid Other | Source: Ambulatory Visit | Attending: Internal Medicine | Admitting: Internal Medicine

## 2013-08-19 DIAGNOSIS — M549 Dorsalgia, unspecified: Secondary | ICD-10-CM | POA: Insufficient documentation

## 2013-08-19 DIAGNOSIS — I10 Essential (primary) hypertension: Secondary | ICD-10-CM

## 2013-08-21 ENCOUNTER — Telehealth: Payer: Self-pay | Admitting: *Deleted

## 2013-08-21 NOTE — Telephone Encounter (Signed)
Message copied by Amaliya Whitelaw, [place] R on Fri Aug 21, 2013  4:00 PM ------      Message from: Allyson Sabal MD, [facility]      Created: Wed Aug 19, 2013  2:57 PM       Normal MR appearance of the thoracic spinal cord ------

## 2013-08-27 ENCOUNTER — Ambulatory Visit (INDEPENDENT_AMBULATORY_CARE_PROVIDER_SITE_OTHER): Payer: Medicaid Other | Admitting: General Surgery

## 2013-08-27 ENCOUNTER — Encounter (INDEPENDENT_AMBULATORY_CARE_PROVIDER_SITE_OTHER): Payer: Self-pay | Admitting: General Surgery

## 2013-08-27 VITALS — BP 138/92 | HR 96 | Temp 97.8°F | Resp 15 | Ht 74.0 in | Wt 254.6 lb

## 2013-08-27 DIAGNOSIS — K5732 Diverticulitis of large intestine without perforation or abscess without bleeding: Secondary | ICD-10-CM

## 2013-08-27 NOTE — Patient Instructions (Signed)
You have recovered from your colon resection for diverticulitis without any obvious complication.  You may resume normal activities without restriction. You may return to work with no restriction.  I strongly urged you to stop smoking.  I strongly urge you to drink 6 or 7 glasses of water a day, follow a high fiber ,  low fat diet, and take fiber supplementation  daily to keep your stools soft.  Return to see Dr. Dalbert Batman as needed.

## 2013-08-27 NOTE — Progress Notes (Signed)
Patient ID: Preston [name], male   DOB: 1970/10/11, 43 y.o.   MRN: [id number]  History:  This 43 year old gentleman with tobacco abuse, obesity, hypertension, gout, and asthma returns for a postop visit.  On 07/08/2013 he underwent laparoscopic lysis of adhesions with takedown splenic flexure, laparoscopic-assisted low anterior resection with 29 mm EEA stapled anastomosis. Final pathology showed diverticulitis.  He says he is tolerating a regular diet and having two reasonably formed stools per day.  He states that he feels much better and is glad that he had the surgery. No chronic pain.   Exam: Patient looks well. Alert. Mental status normal. Friendly. In no distress.  Temp 97.6. Pulse 84. BP 152/106. weight 248  Abd: wounds healed without signs of infection. Wound intact. No hernia. No sign of infection.  Assessment:  Sigmoid diverticulitis, recovering uneventfully  following low anterior resection with stapled anastomosis  History superimposed Clostridium difficile colitis, resolved( prior hospitalization)  Gout  Tobacco abuse  Obesity  Hypertension  Asthma   Plan:  Diet & activities were discussed. Increase walking. No heavy lifting until after Feb. 1, 2015 No restriction thereafter. [facility] [address].  return to work  without restrictions after Feb. 1,2014   diet discussed  Colonoscopy advised in 5 years Return to see me as needed.   Edsel Petrin. Dalbert Batman, M.D., [facility]  General and Minimally invasive Surgery  Breast and Colorectal Surgery  Office: [phone]  Pager: [phone]

## 2013-11-03 ENCOUNTER — Ambulatory Visit: Payer: Medicaid Other | Admitting: Internal Medicine

## 2014-01-29 ENCOUNTER — Other Ambulatory Visit: Payer: Self-pay | Admitting: Internal Medicine

## 2014-03-29 ENCOUNTER — Emergency Department (HOSPITAL_COMMUNITY): Payer: Medicaid Other

## 2014-03-29 ENCOUNTER — Encounter (HOSPITAL_COMMUNITY): Payer: Self-pay | Admitting: Emergency Medicine

## 2014-03-29 ENCOUNTER — Emergency Department (HOSPITAL_COMMUNITY)
Admission: EM | Admit: 2014-03-29 | Discharge: 2014-03-29 | Disposition: A | Discharge status: Home / Self Care | Payer: Self-pay | Attending: Emergency Medicine | Admitting: Emergency Medicine

## 2014-03-29 DIAGNOSIS — S93409A Sprain of unspecified ligament of unspecified ankle, initial encounter: Secondary | ICD-10-CM | POA: Insufficient documentation

## 2014-03-29 DIAGNOSIS — S8990XA Unspecified injury of unspecified lower leg, initial encounter: Secondary | ICD-10-CM | POA: Insufficient documentation

## 2014-03-29 DIAGNOSIS — S79919A Unspecified injury of unspecified hip, initial encounter: Secondary | ICD-10-CM | POA: Insufficient documentation

## 2014-03-29 DIAGNOSIS — Y9289 Other specified places as the place of occurrence of the external cause: Secondary | ICD-10-CM | POA: Insufficient documentation

## 2014-03-29 DIAGNOSIS — J45909 Unspecified asthma, uncomplicated: Secondary | ICD-10-CM | POA: Insufficient documentation

## 2014-03-29 DIAGNOSIS — M545 Low back pain, unspecified: Secondary | ICD-10-CM

## 2014-03-29 DIAGNOSIS — S79929A Unspecified injury of unspecified thigh, initial encounter: Secondary | ICD-10-CM

## 2014-03-29 DIAGNOSIS — W010XXA Fall on same level from slipping, tripping and stumbling without subsequent striking against object, initial encounter: Secondary | ICD-10-CM | POA: Insufficient documentation

## 2014-03-29 DIAGNOSIS — IMO0002 Reserved for concepts with insufficient information to code with codable children: Secondary | ICD-10-CM | POA: Insufficient documentation

## 2014-03-29 DIAGNOSIS — F17200 Nicotine dependence, unspecified, uncomplicated: Secondary | ICD-10-CM | POA: Insufficient documentation

## 2014-03-29 DIAGNOSIS — S99929A Unspecified injury of unspecified foot, initial encounter: Secondary | ICD-10-CM

## 2014-03-29 DIAGNOSIS — F411 Generalized anxiety disorder: Secondary | ICD-10-CM | POA: Insufficient documentation

## 2014-03-29 DIAGNOSIS — S99919A Unspecified injury of unspecified ankle, initial encounter: Secondary | ICD-10-CM

## 2014-03-29 DIAGNOSIS — I10 Essential (primary) hypertension: Secondary | ICD-10-CM | POA: Insufficient documentation

## 2014-03-29 DIAGNOSIS — Z8619 Personal history of other infectious and parasitic diseases: Secondary | ICD-10-CM | POA: Insufficient documentation

## 2014-03-29 DIAGNOSIS — Y9389 Activity, other specified: Secondary | ICD-10-CM | POA: Insufficient documentation

## 2014-03-29 DIAGNOSIS — Z79899 Other long term (current) drug therapy: Secondary | ICD-10-CM | POA: Insufficient documentation

## 2014-03-29 DIAGNOSIS — W19XXXA Unspecified fall, initial encounter: Secondary | ICD-10-CM

## 2014-03-29 DIAGNOSIS — M25571 Pain in right ankle and joints of right foot: Secondary | ICD-10-CM

## 2014-03-29 DIAGNOSIS — F410 Panic disorder [episodic paroxysmal anxiety] without agoraphobia: Secondary | ICD-10-CM | POA: Insufficient documentation

## 2014-03-29 DIAGNOSIS — K219 Gastro-esophageal reflux disease without esophagitis: Secondary | ICD-10-CM | POA: Insufficient documentation

## 2014-03-29 DIAGNOSIS — M109 Gout, unspecified: Secondary | ICD-10-CM | POA: Insufficient documentation

## 2014-03-29 DIAGNOSIS — Z791 Long term (current) use of non-steroidal anti-inflammatories (NSAID): Secondary | ICD-10-CM | POA: Insufficient documentation

## 2014-03-29 DIAGNOSIS — M25551 Pain in right hip: Secondary | ICD-10-CM

## 2014-03-29 MED ORDER — HYDROCODONE-ACETAMINOPHEN 5-325 MG PO TABS: 1.0000 | ORAL_TABLET | ORAL | Status: DC | PRN

## 2014-03-29 MED ORDER — HYDROCODONE-ACETAMINOPHEN 5-325 MG PO TABS
2.0000 | ORAL_TABLET | Freq: Once | ORAL | Status: AC
  Administered 2014-03-29: 2 via ORAL
  Filled 2014-03-29: qty 2

## 2014-03-29 NOTE — ED Notes (Signed)
Patient states mechanical fall 10 days ago.   Patient states he tripped over stepping stone and fell on R hip/R ankle.  Patient states has had R ankle swelling and pain, bilateral hip pain and R back pain since.   Patient states no difficulty walking, "it just hurts".

## 2014-03-29 NOTE — Discharge Instructions (Signed)
Read the information below.  Use the prescribed medication as directed.  Please discuss all new medications with your pharmacist.  Do not take additional tylenol while taking the prescribed pain medication to avoid overdose.  You may return to the Emergency Department at any time for worsening condition or any new symptoms that concern you.  If you develop uncontrolled pain, weakness or numbness of the extremity, severe discoloration of the skin, or you are unable to walk, return to the ER for a recheck.      Ankle Pain Ankle pain is a common symptom. The bones, cartilage, tendons, and muscles of the ankle joint perform a lot of work each day. The ankle joint holds your body weight and allows you to move around. Ankle pain can occur on either side or back of 1 or both ankles. Ankle pain may be sharp and burning or dull and aching. There may be tenderness, stiffness, redness, or warmth around the ankle. The pain occurs more often when a person walks or puts pressure on the ankle. CAUSES  There are many reasons ankle pain can develop. It is important to work with your caregiver to identify the cause since many conditions can impact the bones, cartilage, muscles, and tendons. Causes for ankle pain include:  Injury, including a break (fracture), sprain, or strain often due to a fall, sports, or a high-impact activity.  Swelling (inflammation) of a tendon (tendonitis).  Achilles tendon rupture.  Ankle instability after repeated sprains and strains.  Poor foot alignment.  Pressure on a nerve (tarsal tunnel syndrome).  Arthritis in the ankle or the lining of the ankle.  Crystal formation in the ankle (gout or pseudogout). DIAGNOSIS  A diagnosis is based on your medical history, your symptoms, results of your physical exam, and results of diagnostic tests. Diagnostic tests may include X-ray exams or a computerized magnetic scan (magnetic resonance imaging, MRI). TREATMENT  Treatment will depend on  the cause of your ankle pain and may include:  Keeping pressure off the ankle and limiting activities.  Using crutches or other walking support (a cane or brace).  Using rest, ice, compression, and elevation.  Participating in physical therapy or home exercises.  Wearing shoe inserts or special shoes.  Losing weight.  Taking medications to reduce pain or swelling or receiving an injection.  Undergoing surgery. HOME CARE INSTRUCTIONS   Only take over-the-counter or prescription medicines for pain, discomfort, or fever as directed by your caregiver.  Put ice on the injured area.  Put ice in a plastic bag.  Place a towel between your skin and the bag.  Leave the ice on for 15-20 minutes at a time, 03-04 times a day.  Keep your leg raised (elevated) when possible to lessen swelling.  Avoid activities that cause ankle pain.  Follow specific exercises as directed by your caregiver.  Record how often you have ankle pain, the location of the pain, and what it feels like. This information may be helpful to you and your caregiver.  Ask your caregiver about returning to work or sports and whether you should drive.  Follow up with your caregiver for further examination, therapy, or testing as directed. SEEK MEDICAL CARE IF:   Pain or swelling continues or worsens beyond 1 week.  You have an oral temperature above 102 F (38.9 C).  You are feeling unwell or have chills.  You are having an increasingly difficult time with walking.  You have loss of sensation or other new symptoms.  You have questions or concerns. MAKE SURE YOU:   Understand these instructions.  Will watch your condition.  Will get help right away if you are not doing well or get worse. Document Released: 01/03/2010 Document Revised: 10/08/2011 Document Reviewed: 01/03/2010 [facility] Patient Information 2015 [place], [place]. This information is not intended to replace advice given to you by your health  care provider. Make sure you discuss any questions you have with your health care provider.  Hip Pain Your hip is the joint between your upper legs and your lower pelvis. The bones, cartilage, tendons, and muscles of your hip joint perform a lot of work each day supporting your body weight and allowing you to move around. Hip pain can range from a minor ache to severe pain in one or both of your hips. Pain may be felt on the inside of the hip joint near the groin, or the outside near the buttocks and upper thigh. You may have swelling or stiffness as well.  HOME CARE INSTRUCTIONS   Take medicines only as directed by your health care provider.  Apply ice to the injured area:  Put ice in a plastic bag.  Place a towel between your skin and the bag.  Leave the ice on for 15-20 minutes at a time, 3-4 times a day.  Keep your leg raised (elevated) when possible to lessen swelling.  Avoid activities that cause pain.  Follow specific exercises as directed by your health care provider.  Sleep with a pillow between your legs on your most comfortable side.  Record how often you have hip pain, the location of the pain, and what it feels like. SEEK MEDICAL CARE IF:   You are unable to put weight on your leg.  Your hip is red or swollen or very tender to touch.  Your pain or swelling continues or worsens after 1 week.  You have increasing difficulty walking.  You have a fever. SEEK IMMEDIATE MEDICAL CARE IF:   You have fallen.  You have a sudden increase in pain and swelling in your hip. MAKE SURE YOU:   Understand these instructions.  Will watch your condition.  Will get help right away if you are not doing well or get worse. Document Released: 01/03/2010 Document Revised: 11/30/2013 Document Reviewed: 03/12/2013 [facility] Patient Information 2015 [place], [place]. This information is not intended to replace advice given to you by your health care provider. Make sure you discuss any  questions you have with your health care provider.  Back Pain, Adult Low back pain is very common. About 1 in 5 people have back pain.The cause of low back pain is rarely dangerous. The pain often gets better over time.About half of people with a sudden onset of back pain feel better in just 2 weeks. About 8 in 10 people feel better by 6 weeks.  CAUSES Some common causes of back pain include:  Strain of the muscles or ligaments supporting the spine.  Wear and tear (degeneration) of the spinal discs.  Arthritis.  Direct injury to the back. DIAGNOSIS Most of the time, the direct cause of low back pain is not known.However, back pain can be treated effectively even when the exact cause of the pain is unknown.Answering your caregiver's questions about your overall health and symptoms is one of the most accurate ways to make sure the cause of your pain is not dangerous. If your caregiver needs more information, he or she may order lab work or imaging tests (X-rays or  MRIs).However, even if imaging tests show changes in your back, this usually does not require surgery. HOME CARE INSTRUCTIONS For many people, back pain returns.Since low back pain is rarely dangerous, it is often a condition that people can learn to [facility] their own.   Remain active. It is stressful on the back to sit or stand in one place. Do not sit, drive, or stand in one place for more than 30 minutes at a time. Take short walks on level surfaces as soon as pain allows.Try to increase the length of time you walk each day.  Do not stay in bed.Resting more than 1 or 2 days can delay your recovery.  Do not avoid exercise or work.Your body is made to move.It is not dangerous to be active, even though your back may hurt.Your back will likely heal faster if you return to being active before your pain is gone.  Pay attention to your body when you bend and lift. Many people have less discomfortwhen lifting if they bend  their knees, keep the load close to their bodies,and avoid twisting. Often, the most comfortable positions are those that put less stress on your recovering back.  Find a comfortable position to sleep. Use a firm mattress and lie on your side with your knees slightly bent. If you lie on your back, put a pillow under your knees.  Only take over-the-counter or prescription medicines as directed by your caregiver. Over-the-counter medicines to reduce pain and inflammation are often the most helpful.Your caregiver may prescribe muscle relaxant drugs.These medicines help dull your pain so you can more quickly return to your normal activities and healthy exercise.  Put ice on the injured area.  Put ice in a plastic bag.  Place a towel between your skin and the bag.  Leave the ice on for 15-20 minutes, 03-04 times a day for the first 2 to 3 days. After that, ice and heat may be alternated to reduce pain and spasms.  Ask your caregiver about trying back exercises and gentle massage. This may be of some benefit.  Avoid feeling anxious or stressed.Stress increases muscle tension and can worsen back pain.It is important to recognize when you are anxious or stressed and learn ways to manage it.Exercise is a great option. SEEK MEDICAL CARE IF:  You have pain that is not relieved with rest or medicine.  You have pain that does not improve in 1 week.  You have new symptoms.  You are generally not feeling well. SEEK IMMEDIATE MEDICAL CARE IF:   You have pain that radiates from your back into your legs.  You develop new bowel or bladder control problems.  You have unusual weakness or numbness in your arms or legs.  You develop nausea or vomiting.  You develop abdominal pain.  You feel faint. Document Released: 07/16/2005 Document Revised: 01/15/2012 Document Reviewed: 11/17/2013 [facility] Patient Information 2015 [place], [place]. This information is not intended to replace advice given to  you by your health care provider. Make sure you discuss any questions you have with your health care provider.

## 2014-03-29 NOTE — ED Provider Notes (Signed)
CSN: [id number]     Arrival date & time 03/29/14  1032 History  This chart was scribed for non-physician practitioner, Clayton Bibles, PA-C,working with Varney Biles, MD, by Marlowe Kays, ED Scribe. This patient was seen in room TR07C/TR07C and the patient's care was started at 11:04 AM.  Chief Complaint  Patient presents with  . Ankle Pain  . Hip Pain  . Back Pain   HPI HPI Comments:  Preston [name] is a 43 y.o. male who presents to the Emergency Department complaining of throbbing right ankle pain that radiates into his right great toe secondary to tripping and falling approximately 10 days ago. States he stepped on a stepping stone that shifted and caused him to fall. He reports associated swelling of the right ankle. He states he is experiencing intermittent numbness in his right ankle and toes. He reports associated throbbing back pain with occasional radiation down the right leg. He reports shooting right hip pain. The pain worsens with movements. Pt states he has been taking Motrin for the pain with minimal relief. He denies numbness, tingling or weakness of the extremities, bowel or bladder incontinence. Denies fevers.   Past Medical History  Diagnosis Date  . Gout   . Diverticulitis   . GERD (gastroesophageal reflux disease)   . Hypertension     Does not see a cardiologist  . Anxiety   . Panic attacks   . Bronchitis   . Asthma     reports related to anxiety  . History of Clostridium difficile infection    Past Surgical History  Procedure Laterality Date  . Appendectomy    . Mouth surgery  2010  . Flexible sigmoidoscopy N/A 02/28/2013    Procedure: FLEXIBLE SIGMOIDOSCOPY;  Surgeon: Winfield Cunas., MD;  Location: [facility] ENDOSCOPY;  Service: Endoscopy;  Laterality: N/A;  . Colonoscopy with propofol N/A 04/01/2013    Procedure: COLONOSCOPY WITH PROPOFOL;  Surgeon: Arta Silence, MD;  Location: [facility] ENDOSCOPY;  Service: Endoscopy;  Laterality: N/A;  . Laparoscopic low anterior  resection N/A 07/08/2013    Procedure: LAPAROSCOPIC LOW ANTERIOR RESECTION WITH TAKEDOWN OF SPLENIC FLEXURE.;  Surgeon: Adin Hector, MD;  Location: [facility] OR;  Service: General;  Laterality: N/A;   Family History  Problem Relation Age of Onset  . Cancer Father     lymphoma   History  Substance Use Topics  . Smoking status: Current Every Day Smoker -- 1.50 packs/day for 29 years    Types: Cigarettes  . Smokeless tobacco: Never Used  . Alcohol Use: No     Comment: drank everyday until 6 months ago and none for 1 month,     Review of Systems  Constitutional: Negative for fever.  Genitourinary: Negative for frequency, decreased urine volume and difficulty urinating.  Musculoskeletal: Positive for arthralgias, joint swelling and myalgias.  Skin: Negative for wound.  Allergic/Immunologic: Negative for immunocompromised state.  Neurological: Negative for weakness and numbness.  Hematological: Does not bruise/bleed easily.    Allergies  Morphine and related  Home Medications   Prior to Admission medications   Medication Sig Start Date End Date Taking? Authorizing Provider  allopurinol (ZYLOPRIM) 300 MG tablet Take 1 tablet (300 mg total) by mouth every evening. 08/05/13   Reyne Dumas, MD  buPROPion (WELLBUTRIN SR) 150 MG 12 hr tablet Take 1 tablet (150 mg total) by mouth 2 (two) times daily. 08/05/13 09/23/13  Reyne Dumas, MD  colchicine 0.6 MG tablet Take 0.6 mg by mouth daily. 04/20/13  Verlee Monte, MD  diazepam (VALIUM) 5 MG tablet Take 5 mg by mouth daily as needed for anxiety.    Historical Provider, MD  Fructose-Dextrose-Phosphor Acd (ANTI-NAUSEA PO) Take 5.5 mLs by mouth daily as needed (for nausea).    Historical Provider, MD  hydrochlorothiazide (HYDRODIURIL) 25 MG tablet Take 1 tablet (25 mg total) by mouth daily. 08/05/13   Reyne Dumas, MD  indomethacin (INDOCIN) 25 MG capsule Take 1 capsule (25 mg total) by mouth 3 (three) times daily as needed. For gout flare up. 08/05/13    Reyne Dumas, MD  lisinopril (PRINIVIL,ZESTRIL) 40 MG tablet Take 1 tablet (40 mg total) by mouth daily. 08/05/13   Reyne Dumas, MD  omeprazole (PRILOSEC OTC) 20 MG tablet Take 20 mg by mouth 2 (two) times daily.    Historical Provider, MD  oxyCODONE-acetaminophen (PERCOCET) 10-325 MG per tablet Take 1 tablet by mouth every 6 (six) hours as needed for pain. 06/29/13   Adin Hector, MD  Probiotic Product (PROBIOTIC PO) Take 1 capsule by mouth daily.    Historical Provider, MD  PROVENTIL HFA 108 (90 BASE) MCG/ACT inhaler INHALE 2 PUFFS BY MOUTH EVERY 6 HOURS AS NEEDED FOR WHEEZING OR SHORTNESS OF BREATH    Olugbemiga E Jegede, MD  simvastatin (ZOCOR) 40 MG tablet Take 1 tablet (40 mg total) by mouth at bedtime. 08/06/13   Reyne Dumas, MD   Triage Vitals: BP 162/99  Pulse 105  Temp(Src) 97.7 F (36.5 C) (Oral)  Resp 22  SpO2 98% Physical Exam  Nursing note and vitals reviewed. Constitutional: He appears well-developed and well-nourished. No distress.  HENT:  Head: Normocephalic and atraumatic.  Neck: Neck supple.  Pulmonary/Chest: Effort normal.  Musculoskeletal:  Tender to palpation in sacral area. No crepitus or step offs noted throughout spine. Right hip tender to palpation. Right ankle with lateral edema and tenderness. Moves all digits without difficult. Right knee is nontender with full active ROM.  Neurological: He is alert.  Toes 3-5 with decreased sensations of right foot. DP and PT pulses intact. Cap refill less than 3 seconds.  Skin: He is not diaphoretic.    ED Course  Procedures (including critical care time) DIAGNOSTIC STUDIES: Oxygen Saturation is 98% on RA, normal by my interpretation.   COORDINATION OF CARE: 11:08 AM- Will order X-rays of L-Spine, right hip and right ankle. Will order pain medication. Pt verbalizes understanding and agrees to plan.  Medications  HYDROcodone-acetaminophen (NORCO/VICODIN) 5-325 MG per tablet 2 tablet (not administered)   Labs  Review Labs Reviewed - No data to display  Imaging Review Dg Lumbar Spine Complete  03/29/2014   CLINICAL DATA:  Fall.  Back pain right leg pain.  EXAM: LUMBAR SPINE - COMPLETE 4+ VIEW  COMPARISON:  08/19/2013 MRI is  FINDINGS: Minimal facet arthropathy noted at L4-5 and L5-S1. Slight posterior osseous ridging at L5-S1. Lower thoracic spurring anterior to the vertebral body column observed.  No acute findings.  IMPRESSION: 1. No lumbar spine fracture or acute subluxation observed. 2. Mild thoracolumbar spondylosis.   Electronically Signed   By: Sherryl Barters M.D.   On: 03/29/2014 12:30   Dg Ankle Complete Right  03/29/2014   CLINICAL DATA:  Status post fall with persistent leg pain  EXAM: RIGHT ANKLE - COMPLETE 3+ VIEW  COMPARISON:  None.  FINDINGS: The ankle joint mortise is preserved. The talar dome is intact. There is a large amount of soft tissue swelling over the lateral malleolus. There is no lateral malleolar fracture.  The medial and posterior malleoli are intact. There is a tiny spur from the tip of the medial malleolus. There are plantar and Achilles region calcaneal spurs. The talus and calcaneus are intact however.  IMPRESSION: There is soft tissue swelling laterally. No acute or healing fracture is demonstrated.   Electronically Signed   By: David  Martinique   On: 03/29/2014 12:29   Imaging result did not cross over to Epic but radiologist faxed them over. Findings state subtle linear lucencies in the proximal right femur. Although not a definite finding, these changes do raise the question of a nondisplaced fracture of the proximal femur. MRI would be the study of choice to further evaluate.      EKG Interpretation None      1:43 PM Discussed results with patient.  Pt states he cannot tolerate an MRI, even with medication.  States he had one recently and he had nightmares afterwards.  Pt verbalizes understanding that if his hip is fractured, walking on it may make it worse or might  complete an incomplete fracture.  He has a cane and crutches at home, declines a walker.     MDM   Final diagnoses:  Fall, initial encounter  Right hip pain  Bilateral low back pain without sciatica  Right ankle pain    Afebrile, nontoxic patient with right hip, right ankle, low back pain after fall 10 days ago.  Lateral ankle with swelling and mild ecchymosis- likely sprained.  Xray negative.  ASO placed.  Pt has crutches and cane at home.  Pt with right hip film with severe degenerative changes and possible nondisplaced fracture.  Discussed with radiologist who thought this area could represent degenerative changes vs nondisplaced fracture, would need to be evaluated further with MRI, CT would not be helpful in this situation.  Pt informed of this result and the need for MRI to further evaluate for fracture.  He declines this test, despite a long discussion I had with him about the problems associated with walking on a (possibly) fractured hip.  Pt to follow up with PCP and orthopedist.   D/C home with norco, ASO.  Declines walker.  Discussed result, findings, treatment, and follow up  with patient.  Pt given return precautions.  Pt verbalizes understanding and agrees with plan.      I personally performed the services described in this documentation, which was scribed in my presence. The recorded information has been reviewed and is accurate.    Clayton Bibles, PA-C 03/29/14 1410

## 2014-03-29 NOTE — ED Notes (Signed)
Pt tripped and fell 10 days ago. Has had LBP, right hip pain and right foot/ankle pain since fall. Slight swelling noted to right ankle.

## 2014-04-01 NOTE — ED Provider Notes (Signed)
Medical screening examination/treatment/procedure(s) were performed by non-physician practitioner and as supervising physician I was immediately available for consultation/collaboration.   EKG Interpretation None       Averie Hornbaker, MD 04/01/14 0720 

## 2015-01-12 ENCOUNTER — Encounter (HOSPITAL_COMMUNITY): Payer: Self-pay | Admitting: Emergency Medicine

## 2015-01-12 DIAGNOSIS — Z720 Tobacco use: Secondary | ICD-10-CM | POA: Diagnosis not present

## 2015-01-12 DIAGNOSIS — I10 Essential (primary) hypertension: Secondary | ICD-10-CM | POA: Diagnosis not present

## 2015-01-12 DIAGNOSIS — Z8619 Personal history of other infectious and parasitic diseases: Secondary | ICD-10-CM | POA: Diagnosis not present

## 2015-01-12 DIAGNOSIS — M1009 Idiopathic gout, multiple sites: Secondary | ICD-10-CM | POA: Insufficient documentation

## 2015-01-12 DIAGNOSIS — R05 Cough: Secondary | ICD-10-CM | POA: Diagnosis present

## 2015-01-12 DIAGNOSIS — K219 Gastro-esophageal reflux disease without esophagitis: Secondary | ICD-10-CM | POA: Insufficient documentation

## 2015-01-12 DIAGNOSIS — Z79899 Other long term (current) drug therapy: Secondary | ICD-10-CM | POA: Insufficient documentation

## 2015-01-12 DIAGNOSIS — J45901 Unspecified asthma with (acute) exacerbation: Secondary | ICD-10-CM | POA: Insufficient documentation

## 2015-01-12 DIAGNOSIS — F419 Anxiety disorder, unspecified: Secondary | ICD-10-CM | POA: Diagnosis not present

## 2015-01-12 NOTE — ED Notes (Signed)
Pt. reports gout flare up onset today unrelieved by prescription gout medications , presents with pain/swelling at both knees and ankles. Pain increases with movement /weight bearing .

## 2015-01-13 ENCOUNTER — Emergency Department (HOSPITAL_COMMUNITY)
Admission: EM | Admit: 2015-01-13 | Discharge: 2015-01-13 | Disposition: A | Discharge status: Home / Self Care | Payer: Medicaid Other | Attending: Emergency Medicine | Admitting: Emergency Medicine

## 2015-01-13 ENCOUNTER — Emergency Department (HOSPITAL_COMMUNITY): Payer: Medicaid Other

## 2015-01-13 DIAGNOSIS — M1009 Idiopathic gout, multiple sites: Secondary | ICD-10-CM

## 2015-01-13 DIAGNOSIS — K5792 Diverticulitis of intestine, part unspecified, without perforation or abscess without bleeding: Secondary | ICD-10-CM

## 2015-01-13 DIAGNOSIS — J40 Bronchitis, not specified as acute or chronic: Secondary | ICD-10-CM

## 2015-01-13 LAB — I-STAT CHEM 8, ED
BUN: 5 mg/dL — ABNORMAL LOW (ref 6–20)
CALCIUM ION: 1.03 mmol/L — AB (ref 1.12–1.23)
Chloride: 101 mmol/L (ref 101–111)
Creatinine, Ser: 0.8 mg/dL (ref 0.61–1.24)
Glucose, Bld: 107 mg/dL — ABNORMAL HIGH (ref 65–99)
HEMATOCRIT: 49 % (ref 39.0–52.0)
Hemoglobin: 16.7 g/dL (ref 13.0–17.0)
POTASSIUM: 4.5 mmol/L (ref 3.5–5.1)
Sodium: 135 mmol/L (ref 135–145)
TCO2: 23 mmol/L (ref 0–100)

## 2015-01-13 MED ORDER — INDOMETHACIN 25 MG PO CAPS: 25.0000 mg | ORAL_CAPSULE | Freq: Three times a day (TID) | ORAL | Status: DC | PRN

## 2015-01-13 MED ORDER — PREDNISONE 20 MG PO TABS: 60.0000 mg | ORAL_TABLET | Freq: Every day | ORAL | Status: DC

## 2015-01-13 MED ORDER — ALBUTEROL SULFATE HFA 108 (90 BASE) MCG/ACT IN AERS
1.0000 | INHALATION_SPRAY | RESPIRATORY_TRACT | Status: DC | PRN
  Administered 2015-01-13: 2 via RESPIRATORY_TRACT
  Filled 2015-01-13: qty 6.7

## 2015-01-13 MED ORDER — HYDROCODONE-ACETAMINOPHEN 5-325 MG PO TABS: 1.0000 | ORAL_TABLET | ORAL | Status: DC | PRN

## 2015-01-13 MED ORDER — ALBUTEROL SULFATE HFA 108 (90 BASE) MCG/ACT IN AERS: 1.0000 | INHALATION_SPRAY | Freq: Four times a day (QID) | RESPIRATORY_TRACT | Status: DC | PRN

## 2015-01-13 MED ORDER — IPRATROPIUM-ALBUTEROL 0.5-2.5 (3) MG/3ML IN SOLN
3.0000 mL | Freq: Once | RESPIRATORY_TRACT | Status: AC
  Administered 2015-01-13: 3 mL via RESPIRATORY_TRACT
  Filled 2015-01-13: qty 3

## 2015-01-13 MED ORDER — ONDANSETRON HCL 4 MG PO TABS: 8.0000 mg | ORAL_TABLET | Freq: Once | ORAL | Status: DC

## 2015-01-13 MED ORDER — PREDNISONE 20 MG PO TABS
60.0000 mg | ORAL_TABLET | Freq: Once | ORAL | Status: AC
  Administered 2015-01-13: 60 mg via ORAL
  Filled 2015-01-13: qty 3

## 2015-01-13 MED ORDER — OXYCODONE-ACETAMINOPHEN 5-325 MG PO TABS
2.0000 | ORAL_TABLET | Freq: Once | ORAL | Status: AC
  Administered 2015-01-13: 2 via ORAL
  Filled 2015-01-13: qty 2

## 2015-01-13 MED ORDER — ONDANSETRON 4 MG PO TBDP
8.0000 mg | ORAL_TABLET | Freq: Once | ORAL | Status: AC
  Administered 2015-01-13: 8 mg via ORAL
  Filled 2015-01-13: qty 2

## 2015-01-13 NOTE — Discharge Instructions (Signed)
Gout °Gout is an inflammatory arthritis caused by a buildup of uric acid crystals in the joints. Uric acid is a chemical that is normally present in the blood. When the level of uric acid in the blood is too high it can form crystals that deposit in your joints and tissues. This causes joint redness, soreness, and swelling (inflammation). Repeat attacks are common. Over time, uric acid crystals can form into masses (tophi) near a joint, destroying bone and causing disfigurement. Gout is treatable and often preventable. °CAUSES  °The disease begins with elevated levels of uric acid in the blood. Uric acid is produced by your body when it breaks down a naturally found substance called purines. Certain foods you eat, such as meats and fish, contain high amounts of purines. Causes of an elevated uric acid level include: °· Being passed down from parent to child (heredity). °· Diseases that cause increased uric acid production (such as obesity, psoriasis, and certain cancers). °· Excessive alcohol use. °· Diet, especially diets rich in meat and seafood. °· Medicines, including certain cancer-fighting medicines (chemotherapy), water pills (diuretics), and aspirin. °· Chronic kidney disease. The kidneys are no longer able to remove uric acid well. °· Problems with metabolism. °Conditions strongly associated with gout include: °· Obesity. °· High blood pressure. °· High cholesterol. °· Diabetes. °Not everyone with elevated uric acid levels gets gout. It is not understood why some people get gout and others do not. Surgery, joint injury, and eating too much of certain foods are some of the factors that can lead to gout attacks. °SYMPTOMS  °· An attack of gout comes on quickly. It causes intense pain with redness, swelling, and warmth in a joint. °· Fever can occur. °· Often, only one joint is involved. Certain joints are more commonly involved: °· Base of the big toe. °· Knee. °· Ankle. °· Wrist. °· Finger. °Without  treatment, an attack usually goes away in a few days to weeks. Between attacks, you usually will not have symptoms, which is different from many other forms of arthritis. °DIAGNOSIS  °Your caregiver will suspect gout based on your symptoms and exam. In some cases, tests may be recommended. The tests may include: °· Blood tests. °· Urine tests. °· X-rays. °· Joint fluid exam. This exam requires a needle to remove fluid from the joint (arthrocentesis). Using a microscope, gout is confirmed when uric acid crystals are seen in the joint fluid. °TREATMENT  °There are two phases to gout treatment: treating the sudden onset (acute) attack and preventing attacks (prophylaxis). °· Treatment of an Acute Attack. °· Medicines are used. These include anti-inflammatory medicines or steroid medicines. °· An injection of steroid medicine into the affected joint is sometimes necessary. °· The painful joint is rested. Movement can worsen the arthritis. °· You may use warm or cold treatments on painful joints, depending which works best for you. °· Treatment to Prevent Attacks. °· If you suffer from frequent gout attacks, your caregiver may advise preventive medicine. These medicines are started after the acute attack subsides. These medicines either help your kidneys eliminate uric acid from your body or decrease your uric acid production. You may need to stay on these medicines for a very long time. °· The early phase of treatment with preventive medicine can be associated with an increase in acute gout attacks. For this reason, during the first few months of treatment, your caregiver may also advise you to take medicines usually used for acute gout treatment. Be sure you   understand your caregiver's directions. Your caregiver may make several adjustments to your medicine dose before these medicines are effective.  Discuss dietary treatment with your caregiver or dietitian. Alcohol and drinks high in sugar and fructose and foods  such as meat, poultry, and seafood can increase uric acid levels. Your caregiver or dietitian can advise you on drinks and foods that should be limited. HOME CARE INSTRUCTIONS   Do not take aspirin to relieve pain. This raises uric acid levels.  Only take over-the-counter or prescription medicines for pain, discomfort, or fever as directed by your caregiver.  Rest the joint as much as possible. When in bed, keep sheets and blankets off painful areas.  Keep the affected joint raised (elevated).  Apply warm or cold treatments to painful joints. Use of warm or cold treatments depends on which works best for you.  Use crutches if the painful joint is in your leg.  Drink enough fluids to keep your urine clear or pale yellow. This helps your body get rid of uric acid. Limit alcohol, sugary drinks, and fructose drinks.  Follow your dietary instructions. Pay careful attention to the amount of protein you eat. Your daily diet should emphasize fruits, vegetables, whole grains, and fat-free or low-fat milk products. Discuss the use of coffee, vitamin C, and cherries with your caregiver or dietitian. These may be helpful in lowering uric acid levels.  Maintain a healthy body weight. SEEK MEDICAL CARE IF:   You develop diarrhea, vomiting, or any side effects from medicines.  You do not feel better in 24 hours, or you are getting worse. SEEK IMMEDIATE MEDICAL CARE IF:   Your joint becomes suddenly more tender, and you have chills or a fever. MAKE SURE YOU:   Understand these instructions.  Will watch your condition.  Will get help right away if you are not doing well or get worse. Document Released: 07/13/2000 Document Revised: 11/30/2013 Document Reviewed: 02/27/2012 [facility] Patient Information 2015 [place], [place]. This information is not intended to replace advice given to you by your health care provider. Make sure you discuss any questions you have with your health care provider.  How  to Use an Inhaler Proper inhaler technique is very important. Good technique ensures that the medicine reaches the lungs. Poor technique results in depositing the medicine on the tongue and back of the throat rather than in the airways. If you do not use the inhaler with good technique, the medicine will not help you. STEPS TO FOLLOW IF USING AN INHALER WITHOUT AN EXTENSION TUBE  Remove the cap from the inhaler.  If you are using the inhaler for the first time, you will need to prime it. Shake the inhaler for 5 seconds and release four puffs into the air, away from your face. Ask your health care provider or pharmacist if you have questions about priming your inhaler.  Shake the inhaler for 5 seconds before each breath in (inhalation).  Position the inhaler so that the top of the canister faces up.  Put your index finger on the top of the medicine canister. Your thumb supports the bottom of the inhaler.  Open your mouth.  Either place the inhaler between your teeth and place your lips tightly around the mouthpiece, or hold the inhaler 1-2 inches away from your open mouth. If you are unsure of which technique to use, ask your health care provider.  Breathe out (exhale) normally and as completely as possible.  Press the canister down with your index finger  to release the medicine.  At the same time as the canister is pressed, inhale deeply and slowly until your lungs are completely filled. This should take 4-6 seconds. Keep your tongue down.  Hold the medicine in your lungs for 5-10 seconds (10 seconds is best). This helps the medicine get into the small airways of your lungs.  Breathe out slowly, through pursed lips. Whistling is an example of pursed lips.  Wait at least 15-30 seconds between puffs. Continue with the above steps until you have taken the number of puffs your health care provider has ordered. Do not use the inhaler more than your health care provider tells you.  Replace  the cap on the inhaler.  Follow the directions from your health care provider or the inhaler insert for cleaning the inhaler. STEPS TO FOLLOW IF USING AN INHALER WITH AN EXTENSION (SPACER)  Remove the cap from the inhaler.  If you are using the inhaler for the first time, you will need to prime it. Shake the inhaler for 5 seconds and release four puffs into the air, away from your face. Ask your health care provider or pharmacist if you have questions about priming your inhaler.  Shake the inhaler for 5 seconds before each breath in (inhalation).  Place the open end of the spacer onto the mouthpiece of the inhaler.  Position the inhaler so that the top of the canister faces up and the spacer mouthpiece faces you.  Put your index finger on the top of the medicine canister. Your thumb supports the bottom of the inhaler and the spacer.  Breathe out (exhale) normally and as completely as possible.  Immediately after exhaling, place the spacer between your teeth and into your mouth. Close your lips tightly around the spacer.  Press the canister down with your index finger to release the medicine.  At the same time as the canister is pressed, inhale deeply and slowly until your lungs are completely filled. This should take 4-6 seconds. Keep your tongue down and out of the way.  Hold the medicine in your lungs for 5-10 seconds (10 seconds is best). This helps the medicine get into the small airways of your lungs. Exhale.  Repeat inhaling deeply through the spacer mouthpiece. Again hold that breath for up to 10 seconds (10 seconds is best). Exhale slowly. If it is difficult to take this second deep breath through the spacer, breathe normally several times through the spacer. Remove the spacer from your mouth.  Wait at least 15-30 seconds between puffs. Continue with the above steps until you have taken the number of puffs your health care provider has ordered. Do not use the inhaler more than  your health care provider tells you.  Remove the spacer from the inhaler, and place the cap on the inhaler.  Follow the directions from your health care provider or the inhaler insert for cleaning the inhaler and spacer. If you are using different kinds of inhalers, use your quick relief medicine to open the airways 10-15 minutes before using a steroid if instructed to do so by your health care provider. If you are unsure which inhalers to use and the order of using them, ask your health care provider, nurse, or respiratory therapist. If you are using a steroid inhaler, always rinse your mouth with water after your last puff, then gargle and spit out the water. Do not swallow the water. AVOID:  Inhaling before or after starting the spray of medicine. It takes practice  to coordinate your breathing with triggering the spray.  Inhaling through the nose (rather than the mouth) when triggering the spray. HOW TO DETERMINE IF YOUR INHALER IS FULL OR NEARLY EMPTY You cannot know when an inhaler is empty by shaking it. A few inhalers are now being made with dose counters. Ask your health care provider for a prescription that has a dose counter if you feel you need that extra help. If your inhaler does not have a counter, ask your health care provider to help you determine the date you need to refill your inhaler. Write the refill date on a calendar or your inhaler canister. Refill your inhaler 7-10 days before it runs out. Be sure to keep an adequate supply of medicine. This includes making sure it is not expired, and that you have a spare inhaler.  SEEK MEDICAL CARE IF:   Your symptoms are only partially relieved with your inhaler.  You are having trouble using your inhaler.  You have some increase in phlegm. SEEK IMMEDIATE MEDICAL CARE IF:   You feel little or no relief with your inhalers. You are still wheezing and are feeling shortness of breath or tightness in your chest or both.  You have  dizziness, headaches, or a fast heart rate.  You have chills, fever, or night sweats.  You have a noticeable increase in phlegm production, or there is blood in the phlegm. MAKE SURE YOU:   Understand these instructions.  Will watch your condition.  Will get help right away if you are not doing well or get worse. Document Released: 07/13/2000 Document Revised: 05/06/2013 Document Reviewed: 02/12/2013 [facility] Patient Information 2015 [place], [place]. This information is not intended to replace advice given to you by your health care provider. Make sure you discuss any questions you have with your health care provider.  Low-Purine Diet Purines are compounds that affect the level of uric acid in your body. A low-purine diet is a diet that is low in purines. Eating a low-purine diet can prevent the level of uric acid in your body from getting too high and causing gout or kidney stones or both. WHAT DO I NEED TO KNOW ABOUT THIS DIET?  Choose low-purine foods. Examples of low-purine foods are listed in the next section.  Drink plenty of fluids, especially water. Fluids can help remove uric acid from your body. Try to drink 8-16 cups (1.9-3.8 L) a day.  Limit foods high in fat, especially saturated fat, as fat makes it harder for the body to get rid of uric acid. Foods high in saturated fat include pizza, cheese, ice cream, whole milk, fried foods, and gravies. Choose foods that are lower in fat and lean sources of protein. Use olive oil when cooking as it contains healthy fats that are not high in saturated fat.  Limit alcohol. Alcohol interferes with the elimination of uric acid from your body. If you are having a gout attack, avoid all alcohol.  Keep in mind that different people's bodies react differently to different foods. You will probably learn over time which foods do or do not affect you. If you discover that a food tends to cause your gout to flare up, avoid eating that food. You can  more freely enjoy foods that do not cause problems. If you have any questions about a food item, talk to your dietitian or health care provider. WHICH FOODS ARE LOW, MODERATE, AND HIGH IN PURINES? The following is a list of foods that are low,  moderate, and high in purines. You can eat any amount of the foods that are low in purines. You may be able to have small amounts of foods that are moderate in purines. Ask your health care provider how much of a food moderate in purines you can have. Avoid foods high in purines. Grains  Foods low in purines: Enriched white bread, pasta, rice, cake, cornbread, popcorn.  Foods moderate in purines: Whole-grain breads and cereals, wheat germ, bran, oatmeal. Uncooked oatmeal. Dry wheat bran or wheat germ.  Foods high in purines: Pancakes, [place] toast, biscuits, muffins. Vegetables  Foods low in purines: All vegetables, except those that are moderate in purines.  Foods moderate in purines: Asparagus, cauliflower, spinach, mushrooms, green peas. Fruits  All fruits are low in purines. Meats and other Protein Foods  Foods low in purines: Eggs, nuts, peanut butter.  Foods moderate in purines: 80-90% lean beef, lamb, veal, pork, poultry, fish, eggs, peanut butter, nuts. Crab, lobster, oysters, and shrimp. Cooked dried beans, peas, and lentils.  Foods high in purines: Anchovies, sardines, herring, mussels, tuna, codfish, scallops, trout, and haddock. Berniece Salines. Organ meats (such as liver or kidney). Tripe. Game meat. Goose. Sweetbreads. Dairy  All dairy foods are low in purines. Low-fat and fat-free dairy products are best because they are low in saturated fat. Beverages  Drinks low in purines: Water, carbonated beverages, tea, coffee, cocoa.  Drinks moderate in purines: Soft drinks and other drinks sweetened with high-fructose corn syrup. Juices. To find whether a food or drink is sweetened with high-fructose corn syrup, look at the ingredients  list.  Drinks high in purines: Alcoholic beverages (such as beer). Condiments  Foods low in purines: Salt, herbs, olives, pickles, relishes, vinegar.  Foods moderate in purines: Butter, margarine, oils, mayonnaise. Fats and Oils  Foods low in purines: All types, except gravies and sauces made with meat.  Foods high in purines: Gravies and sauces made with meat. Other Foods  Foods low in purines: Sugars, sweets, gelatin. Cake. Soups made without meat.  Foods moderate in purines: Meat-based or fish-based soups, broths, or bouillons. Foods and drinks sweetened with high-fructose corn syrup.  Foods high in purines: High-fat desserts (such as ice cream, cookies, cakes, pies, doughnuts, and chocolate). Contact your dietitian for more information on foods that are not listed here. Document Released: 11/10/2010 Document Revised: 07/21/2013 Document Reviewed: 06/22/2013 [facility] Patient Information 2015 [place], [place]. This information is not intended to replace advice given to you by your health care provider. Make sure you discuss any questions you have with your health care provider.

## 2015-01-13 NOTE — ED Notes (Signed)
Pt has a strong cough, that caused the pt to vomit. Pt states that he becomes short of breath at night, but he is out of refills for the inhaler that he uses. Expiratory wheezes heard on auscultation.

## 2015-01-13 NOTE — ED Provider Notes (Signed)
CSN: [id number]     Arrival date & time 01/12/15  2242 History  This chart was scribed for Preston Flemings, MD by Preston [name], ED Scribe. This patient was seen in room D36C/D36C and the patient's care was started at 3:43 AM.    Chief Complaint  Patient presents with  . Gout  . Cough  . Shortness of Breath   The history is provided by the patient. No language interpreter was used.     HPI Comments: Preston [name] is a 44 y.o. male with past medical history of HTN, gout, asthma who presents to the Emergency Department complaining of gout pain. Patient explains that his pain and swelling originated in his right knee before migrating to his right great toe; it then moved into his left leg on 6/15 and became unbearable, prompting him to come to the ED tonight. His pain, now located in both knees and ankles, is exacerbated with movement and weight bearing. Patient states he is "as compliant as possible" with his suggested gout diet; estimates that he eats red meat 2x per week.   He also complains of cough with an episode of post-tussive emesis. He states he becomes SOB at night but is currently out of refills for his inhaler.   No PCP at present. He denies a history of DM. He is a 1.5ppd smoker.   Past Medical History  Diagnosis Date  . Gout   . Diverticulitis   . GERD (gastroesophageal reflux disease)   . Hypertension     Does not see a cardiologist  . Anxiety   . Panic attacks   . Bronchitis   . Asthma     reports related to anxiety  . History of Clostridium difficile infection    Past Surgical History  Procedure Laterality Date  . Appendectomy    . Mouth surgery  2010  . Flexible sigmoidoscopy N/A 02/28/2013    Procedure: FLEXIBLE SIGMOIDOSCOPY;  Surgeon: Winfield Cunas., MD;  Location: [facility] ENDOSCOPY;  Service: Endoscopy;  Laterality: N/A;  . Colonoscopy with propofol N/A 04/01/2013    Procedure: COLONOSCOPY WITH PROPOFOL;  Surgeon: Arta Silence, MD;  Location: [facility] ENDOSCOPY;   Service: Endoscopy;  Laterality: N/A;  . Laparoscopic low anterior resection N/A 07/08/2013    Procedure: LAPAROSCOPIC LOW ANTERIOR RESECTION WITH TAKEDOWN OF SPLENIC FLEXURE.;  Surgeon: Adin Hector, MD;  Location: [facility] OR;  Service: General;  Laterality: N/A;   Family History  Problem Relation Age of Onset  . Cancer Father     lymphoma   History  Substance Use Topics  . Smoking status: Current Every Day Smoker -- 1.50 packs/day for 29 years    Types: Cigarettes  . Smokeless tobacco: Never Used  . Alcohol Use: No     Comment: drank everyday until 6 months ago and none for 1 month,     Review of Systems  Respiratory: Positive for cough and shortness of breath.   Musculoskeletal: Positive for joint swelling and arthralgias.  All other systems reviewed and are negative.   Allergies  Morphine and related  Home Medications   Prior to Admission medications   Medication Sig Start Date End Date Taking? Authorizing Provider  calcium carbonate (TUMS - DOSED IN MG ELEMENTAL CALCIUM) 500 MG chewable tablet Chew 1 tablet by mouth 3 (three) times daily as needed for indigestion or heartburn.   Yes Historical Provider, MD  colchicine 0.6 MG tablet Take 0.6 mg by mouth every hour as needed (can take hourly  for up to 8 doses for a severe gout flare up).  04/20/13  Yes Verlee Monte, MD  ibuprofen (ADVIL,MOTRIN) 200 MG tablet Take 800 mg by mouth every 6 (six) hours as needed for headache or moderate pain.   Yes Historical Provider, MD  indomethacin (INDOCIN) 25 MG capsule Take 1 capsule (25 mg total) by mouth 3 (three) times daily as needed. For gout flare up. 08/05/13  Yes Reyne Dumas, MD  allopurinol (ZYLOPRIM) 300 MG tablet Take 1 tablet (300 mg total) by mouth every evening. Patient not taking: Reported on 01/13/2015 08/05/13   Reyne Dumas, MD  buPROPion [facility] SR) 150 MG 12 hr tablet Take 1 tablet (150 mg total) by mouth 2 (two) times daily. 08/05/13 09/23/13  Reyne Dumas, MD  diazepam  (VALIUM) 5 MG tablet Take 5 mg by mouth daily as needed for anxiety.    Historical Provider, MD  Fructose-Dextrose-Phosphor Acd (ANTI-NAUSEA PO) Take 5.5 mLs by mouth daily as needed (for nausea).    Historical Provider, MD  hydrochlorothiazide (HYDRODIURIL) 25 MG tablet Take 1 tablet (25 mg total) by mouth daily. Patient not taking: Reported on 01/13/2015 08/05/13   Reyne Dumas, MD  HYDROcodone-acetaminophen (NORCO/VICODIN) 5-325 MG per tablet Take 1-2 tablets by mouth every 4 (four) hours as needed for moderate pain or severe pain. Patient not taking: Reported on 01/13/2015 03/29/14   Clayton Bibles, PA-C  lisinopril (PRINIVIL,ZESTRIL) 40 MG tablet Take 1 tablet (40 mg total) by mouth daily. Patient not taking: Reported on 01/13/2015 08/05/13   Reyne Dumas, MD  omeprazole (PRILOSEC OTC) 20 MG tablet Take 20 mg by mouth 2 (two) times daily.    Historical Provider, MD  oxyCODONE-acetaminophen (PERCOCET) 10-325 MG per tablet Take 1 tablet by mouth every 6 (six) hours as needed for pain. Patient not taking: Reported on 01/13/2015 06/29/13   Fanny Skates, MD  Probiotic Product (PROBIOTIC PO) Take 1 capsule by mouth daily.    Historical Provider, MD  PROVENTIL HFA 108 (90 BASE) MCG/ACT inhaler INHALE 2 PUFFS BY MOUTH EVERY 6 HOURS AS NEEDED FOR WHEEZING OR SHORTNESS OF BREATH Patient not taking: Reported on 01/13/2015    Tresa Garter, MD  simvastatin (ZOCOR) 40 MG tablet Take 1 tablet (40 mg total) by mouth at bedtime. Patient not taking: Reported on 01/13/2015 08/06/13   Reyne Dumas, MD   BP 161/97 mmHg  Pulse 107  Temp(Src) 98.5 F (36.9 C) (Oral)  Resp 16  Ht 6\' 3"  (1.905 m)  SpO2 94% Physical Exam  Constitutional: He is oriented to person, place, and time. He appears well-developed and well-nourished. No distress.  HENT:  Head: Normocephalic and atraumatic.  Mouth/Throat: Oropharynx is clear and moist. No oropharyngeal exudate.  Moist mucous membranes  Eyes: EOM are normal. Pupils are equal,  round, and reactive to light.  Neck: Normal range of motion. Neck supple. No JVD present.  Cardiovascular: Normal rate, regular rhythm and normal heart sounds.  Exam reveals no gallop and no friction rub.   No murmur heard. Pulmonary/Chest: Effort normal. No respiratory distress. He has wheezes. He has no rales.  Abdominal: Soft. Bowel sounds are normal. He exhibits no mass. There is no tenderness. There is no rebound and no guarding.  Musculoskeletal: He exhibits edema and tenderness.  TTP over bilateral knees and ankles with edema.  Joints are slightly warm to the touch, no erythema.  Limited range of motion secondary to pain of knees and ankles  Lymphadenopathy:    He has no cervical adenopathy.  Neurological: He is alert and oriented to person, place, and time. He displays normal reflexes.  Skin: Skin is warm and dry. No rash noted.  Psychiatric: He has a normal mood and affect. His behavior is normal.  Nursing note and vitals reviewed.   ED Course  Procedures   DIAGNOSTIC STUDIES: Oxygen Saturation is 96% on RA, adequate by my interpretation.    COORDINATION OF CARE: 3:49 AM Discussed treatment plan with pt at bedside and pt agreed to plan.   Labs Review Labs Reviewed  I-STAT CHEM 8, ED - Abnormal; Notable for the following:    BUN 5 (*)    Glucose, Bld 107 (*)    Calcium, Ion 1.03 (*)    All other components within normal limits    Imaging Review Dg Chest 2 View  01/13/2015   CLINICAL DATA:  Shortness of breath, cough, and asthma. History of gout.  EXAM: CHEST  2 VIEW  COMPARISON:  Chest 06/29/2013  FINDINGS: Hyperinflation. Peribronchial thickening and central interstitial changes suggesting chronic bronchitis. The heart size and mediastinal contours are within normal limits. No focal airspace disease or consolidation. The visualized skeletal structures are unremarkable.  IMPRESSION: Emphysematous changes and chronic bronchitic changes. No evidence of active pulmonary  disease.   Electronically Signed   By: Lucienne Capers M.D.   On: 01/13/2015 04:21     EKG Interpretation None      MDM   Final diagnoses:  Bronchitis  Acute idiopathic gout of multiple sites    44 year old male with intermittent gout systems over the last 2 weeks, worsening over the last 24 hours.  Patient reports that he is adherent to his low purine diet.  Patient also with shortness of breath and cough.  Has history of chronic bronchitis is currently smoking and is out of his inhaler.  Much improved after a neb treatment here.  He'll be given an albuterol inhaler for home use.  We'll start him on prednisone for both bronchitis and gout.     Preston Flemings, MD 01/13/15 [phone]

## 2015-03-11 ENCOUNTER — Encounter (HOSPITAL_COMMUNITY): Payer: Self-pay | Admitting: *Deleted

## 2015-03-11 ENCOUNTER — Emergency Department (HOSPITAL_COMMUNITY): Payer: Medicaid Other

## 2015-03-11 ENCOUNTER — Inpatient Hospital Stay (HOSPITAL_COMMUNITY)
Admission: EM | Admit: 2015-03-11 | Discharge: 2015-03-14 | DRG: 293 | Disposition: A | Discharge status: Home / Self Care | Payer: Medicaid Other | Attending: Internal Medicine | Admitting: Internal Medicine

## 2015-03-11 DIAGNOSIS — E785 Hyperlipidemia, unspecified: Secondary | ICD-10-CM | POA: Diagnosis present

## 2015-03-11 DIAGNOSIS — J45909 Unspecified asthma, uncomplicated: Secondary | ICD-10-CM | POA: Diagnosis present

## 2015-03-11 DIAGNOSIS — F410 Panic disorder [episodic paroxysmal anxiety] without agoraphobia: Secondary | ICD-10-CM | POA: Diagnosis present

## 2015-03-11 DIAGNOSIS — M79604 Pain in right leg: Secondary | ICD-10-CM | POA: Diagnosis present

## 2015-03-11 DIAGNOSIS — R197 Diarrhea, unspecified: Secondary | ICD-10-CM

## 2015-03-11 DIAGNOSIS — F32A Depression, unspecified: Secondary | ICD-10-CM | POA: Diagnosis present

## 2015-03-11 DIAGNOSIS — Z7952 Long term (current) use of systemic steroids: Secondary | ICD-10-CM

## 2015-03-11 DIAGNOSIS — I5031 Acute diastolic (congestive) heart failure: Secondary | ICD-10-CM | POA: Diagnosis present

## 2015-03-11 DIAGNOSIS — F1010 Alcohol abuse, uncomplicated: Secondary | ICD-10-CM | POA: Diagnosis present

## 2015-03-11 DIAGNOSIS — I161 Hypertensive emergency: Secondary | ICD-10-CM | POA: Diagnosis present

## 2015-03-11 DIAGNOSIS — J449 Chronic obstructive pulmonary disease, unspecified: Secondary | ICD-10-CM | POA: Diagnosis present

## 2015-03-11 DIAGNOSIS — Z79891 Long term (current) use of opiate analgesic: Secondary | ICD-10-CM

## 2015-03-11 DIAGNOSIS — F419 Anxiety disorder, unspecified: Secondary | ICD-10-CM | POA: Diagnosis present

## 2015-03-11 DIAGNOSIS — R7989 Other specified abnormal findings of blood chemistry: Secondary | ICD-10-CM

## 2015-03-11 DIAGNOSIS — Z9114 Patient's other noncompliance with medication regimen: Secondary | ICD-10-CM | POA: Diagnosis present

## 2015-03-11 DIAGNOSIS — R0602 Shortness of breath: Secondary | ICD-10-CM | POA: Diagnosis present

## 2015-03-11 DIAGNOSIS — R778 Other specified abnormalities of plasma proteins: Secondary | ICD-10-CM | POA: Diagnosis present

## 2015-03-11 DIAGNOSIS — M109 Gout, unspecified: Secondary | ICD-10-CM | POA: Diagnosis present

## 2015-03-11 DIAGNOSIS — I10 Essential (primary) hypertension: Secondary | ICD-10-CM | POA: Diagnosis present

## 2015-03-11 DIAGNOSIS — Z885 Allergy status to narcotic agent status: Secondary | ICD-10-CM

## 2015-03-11 DIAGNOSIS — M25552 Pain in left hip: Secondary | ICD-10-CM | POA: Diagnosis present

## 2015-03-11 DIAGNOSIS — E669 Obesity, unspecified: Secondary | ICD-10-CM | POA: Diagnosis present

## 2015-03-11 DIAGNOSIS — M1389 Other specified arthritis, multiple sites: Secondary | ICD-10-CM | POA: Diagnosis present

## 2015-03-11 DIAGNOSIS — K219 Gastro-esophageal reflux disease without esophagitis: Secondary | ICD-10-CM | POA: Diagnosis present

## 2015-03-11 DIAGNOSIS — G43909 Migraine, unspecified, not intractable, without status migrainosus: Secondary | ICD-10-CM | POA: Diagnosis present

## 2015-03-11 DIAGNOSIS — R600 Localized edema: Secondary | ICD-10-CM | POA: Diagnosis present

## 2015-03-11 DIAGNOSIS — F329 Major depressive disorder, single episode, unspecified: Secondary | ICD-10-CM | POA: Diagnosis present

## 2015-03-11 DIAGNOSIS — Z79899 Other long term (current) drug therapy: Secondary | ICD-10-CM

## 2015-03-11 DIAGNOSIS — Z6835 Body mass index (BMI) 35.0-35.9, adult: Secondary | ICD-10-CM

## 2015-03-11 DIAGNOSIS — E780 Pure hypercholesterolemia: Secondary | ICD-10-CM | POA: Diagnosis present

## 2015-03-11 DIAGNOSIS — Z720 Tobacco use: Secondary | ICD-10-CM

## 2015-03-11 DIAGNOSIS — G8929 Other chronic pain: Secondary | ICD-10-CM | POA: Diagnosis present

## 2015-03-11 DIAGNOSIS — M79605 Pain in left leg: Secondary | ICD-10-CM | POA: Diagnosis present

## 2015-03-11 DIAGNOSIS — K5792 Diverticulitis of intestine, part unspecified, without perforation or abscess without bleeding: Secondary | ICD-10-CM | POA: Diagnosis present

## 2015-03-11 DIAGNOSIS — M545 Low back pain: Secondary | ICD-10-CM | POA: Diagnosis present

## 2015-03-11 DIAGNOSIS — F17210 Nicotine dependence, cigarettes, uncomplicated: Secondary | ICD-10-CM | POA: Diagnosis present

## 2015-03-11 DIAGNOSIS — R609 Edema, unspecified: Secondary | ICD-10-CM

## 2015-03-11 DIAGNOSIS — M25551 Pain in right hip: Secondary | ICD-10-CM | POA: Diagnosis present

## 2015-03-11 HISTORY — DX: Chronic obstructive pulmonary disease, unspecified: J44.9

## 2015-03-11 HISTORY — DX: Low back pain: M54.5

## 2015-03-11 HISTORY — DX: Other chronic pain: G89.29

## 2015-03-11 HISTORY — DX: Pure hypercholesterolemia, unspecified: E78.00

## 2015-03-11 HISTORY — DX: Low back pain, unspecified: M54.50

## 2015-03-11 LAB — CBC
HCT: 48.1 % (ref 39.0–52.0)
HEMOGLOBIN: 16.4 g/dL (ref 13.0–17.0)
MCH: 34.2 pg — ABNORMAL HIGH (ref 26.0–34.0)
MCHC: 34.1 g/dL (ref 30.0–36.0)
MCV: 100.2 fL — ABNORMAL HIGH (ref 78.0–100.0)
PLATELETS: 252 10*3/uL (ref 150–400)
RBC: 4.8 MIL/uL (ref 4.22–5.81)
RDW: 13.7 % (ref 11.5–15.5)
WBC: 9.7 10*3/uL (ref 4.0–10.5)

## 2015-03-11 LAB — BASIC METABOLIC PANEL
Anion gap: 14 (ref 5–15)
BUN: 5 mg/dL — AB (ref 6–20)
CO2: 23 mmol/L (ref 22–32)
Calcium: 10.2 mg/dL (ref 8.9–10.3)
Chloride: 104 mmol/L (ref 101–111)
Creatinine, Ser: 0.83 mg/dL (ref 0.61–1.24)
Glucose, Bld: 101 mg/dL — ABNORMAL HIGH (ref 65–99)
POTASSIUM: 4.1 mmol/L (ref 3.5–5.1)
Sodium: 141 mmol/L (ref 135–145)

## 2015-03-11 LAB — BRAIN NATRIURETIC PEPTIDE: B Natriuretic Peptide: 34.3 pg/mL (ref 0.0–100.0)

## 2015-03-11 LAB — TROPONIN I: Troponin I: 0.04 ng/mL — ABNORMAL HIGH (ref <0.031)

## 2015-03-11 MED ORDER — ADULT MULTIVITAMIN W/MINERALS CH
1.0000 | ORAL_TABLET | Freq: Every day | ORAL | Status: DC
  Administered 2015-03-12 – 2015-03-14 (×3): 1 via ORAL
  Filled 2015-03-11 (×3): qty 1

## 2015-03-11 MED ORDER — SODIUM CHLORIDE 0.9 % IV SOLN
250.0000 mL | INTRAVENOUS | Status: DC | PRN
  Administered 2015-03-12: 250 mL via INTRAVENOUS

## 2015-03-11 MED ORDER — VITAMIN B-1 100 MG PO TABS
100.0000 mg | ORAL_TABLET | Freq: Every day | ORAL | Status: DC
  Administered 2015-03-12 – 2015-03-14 (×3): 100 mg via ORAL
  Filled 2015-03-11 (×3): qty 1

## 2015-03-11 MED ORDER — SODIUM CHLORIDE 0.9 % IJ SOLN
3.0000 mL | Freq: Two times a day (BID) | INTRAMUSCULAR | Status: DC
  Administered 2015-03-12 – 2015-03-13 (×4): 3 mL via INTRAVENOUS

## 2015-03-11 MED ORDER — FUROSEMIDE 10 MG/ML IJ SOLN
40.0000 mg | Freq: Every day | INTRAMUSCULAR | Status: DC
  Administered 2015-03-12 – 2015-03-14 (×3): 40 mg via INTRAVENOUS
  Filled 2015-03-11 (×3): qty 4

## 2015-03-11 MED ORDER — ASPIRIN 325 MG PO TABS
325.0000 mg | ORAL_TABLET | Freq: Every day | ORAL | Status: DC
  Administered 2015-03-12 – 2015-03-14 (×3): 325 mg via ORAL
  Filled 2015-03-11 (×3): qty 1

## 2015-03-11 MED ORDER — LORAZEPAM 2 MG/ML IJ SOLN: 0.0000 mg | Freq: Two times a day (BID) | INTRAMUSCULAR | Status: DC

## 2015-03-11 MED ORDER — ALBUTEROL SULFATE HFA 108 (90 BASE) MCG/ACT IN AERS: 1.0000 | INHALATION_SPRAY | Freq: Four times a day (QID) | RESPIRATORY_TRACT | Status: DC | PRN

## 2015-03-11 MED ORDER — LABETALOL HCL 5 MG/ML IV SOLN
10.0000 mg | Freq: Once | INTRAVENOUS | Status: AC
  Administered 2015-03-11: 10 mg via INTRAVENOUS
  Filled 2015-03-11: qty 4

## 2015-03-11 MED ORDER — NITROGLYCERIN IN D5W 200-5 MCG/ML-% IV SOLN
2.0000 ug/min | INTRAVENOUS | Status: DC
  Administered 2015-03-12: 5 ug/min via INTRAVENOUS
  Filled 2015-03-11: qty 250

## 2015-03-11 MED ORDER — LISINOPRIL 40 MG PO TABS
40.0000 mg | ORAL_TABLET | Freq: Every day | ORAL | Status: DC
  Administered 2015-03-12 – 2015-03-14 (×3): 40 mg via ORAL
  Filled 2015-03-11 (×3): qty 1

## 2015-03-11 MED ORDER — INDOMETHACIN 25 MG PO CAPS: 25.0000 mg | ORAL_CAPSULE | Freq: Three times a day (TID) | ORAL | Status: DC | PRN

## 2015-03-11 MED ORDER — NICOTINE 21 MG/24HR TD PT24
21.0000 mg | MEDICATED_PATCH | Freq: Every day | TRANSDERMAL | Status: DC
  Administered 2015-03-12 – 2015-03-14 (×3): 21 mg via TRANSDERMAL
  Filled 2015-03-11 (×3): qty 1

## 2015-03-11 MED ORDER — LORAZEPAM 2 MG/ML IJ SOLN: 0.0000 mg | Freq: Four times a day (QID) | INTRAMUSCULAR | Status: DC

## 2015-03-11 MED ORDER — LORAZEPAM 1 MG PO TABS
1.0000 mg | ORAL_TABLET | Freq: Four times a day (QID) | ORAL | Status: DC | PRN
  Administered 2015-03-12 – 2015-03-13 (×2): 1 mg via ORAL
  Filled 2015-03-11 (×3): qty 1

## 2015-03-11 MED ORDER — ALLOPURINOL 300 MG PO TABS
300.0000 mg | ORAL_TABLET | Freq: Every evening | ORAL | Status: DC
  Administered 2015-03-12: 300 mg via ORAL

## 2015-03-11 MED ORDER — HEPARIN SODIUM (PORCINE) 5000 UNIT/ML IJ SOLN
5000.0000 [IU] | Freq: Three times a day (TID) | INTRAMUSCULAR | Status: DC
  Administered 2015-03-12 – 2015-03-14 (×8): 5000 [IU] via SUBCUTANEOUS
  Filled 2015-03-11 (×8): qty 1

## 2015-03-11 MED ORDER — THIAMINE HCL 100 MG/ML IJ SOLN: 100.0000 mg | Freq: Every day | INTRAMUSCULAR | Status: DC

## 2015-03-11 MED ORDER — ONDANSETRON HCL 4 MG/2ML IJ SOLN: 4.0000 mg | Freq: Four times a day (QID) | INTRAMUSCULAR | Status: DC | PRN

## 2015-03-11 MED ORDER — BUPROPION HCL ER (SR) 150 MG PO TB12
150.0000 mg | ORAL_TABLET | Freq: Two times a day (BID) | ORAL | Status: DC
  Filled 2015-03-11 (×3): qty 1

## 2015-03-11 MED ORDER — ACETAMINOPHEN 325 MG PO TABS: 650.0000 mg | ORAL_TABLET | ORAL | Status: DC | PRN

## 2015-03-11 MED ORDER — SODIUM CHLORIDE 0.9 % IJ SOLN: 3.0000 mL | INTRAMUSCULAR | Status: DC | PRN

## 2015-03-11 MED ORDER — ATORVASTATIN CALCIUM 40 MG PO TABS
40.0000 mg | ORAL_TABLET | Freq: Every day | ORAL | Status: DC
  Administered 2015-03-12 – 2015-03-13 (×3): 40 mg via ORAL
  Filled 2015-03-11 (×3): qty 1

## 2015-03-11 MED ORDER — OXYCODONE-ACETAMINOPHEN 5-325 MG PO TABS
2.0000 | ORAL_TABLET | Freq: Once | ORAL | Status: AC
  Administered 2015-03-11: 2 via ORAL
  Filled 2015-03-11: qty 2

## 2015-03-11 MED ORDER — HYDROCODONE-ACETAMINOPHEN 5-325 MG PO TABS
1.0000 | ORAL_TABLET | ORAL | Status: DC | PRN
  Administered 2015-03-12 (×2): 2 via ORAL
  Administered 2015-03-12: 1 via ORAL
  Administered 2015-03-13 – 2015-03-14 (×7): 2 via ORAL
  Filled 2015-03-11 (×10): qty 2

## 2015-03-11 MED ORDER — FOLIC ACID 1 MG PO TABS
1.0000 mg | ORAL_TABLET | Freq: Every day | ORAL | Status: DC
  Administered 2015-03-12 – 2015-03-14 (×3): 1 mg via ORAL
  Filled 2015-03-11 (×3): qty 1

## 2015-03-11 MED ORDER — HYDROMORPHONE HCL 1 MG/ML IJ SOLN
1.0000 mg | INTRAMUSCULAR | Status: DC | PRN
  Administered 2015-03-12 – 2015-03-13 (×4): 1 mg via INTRAVENOUS
  Filled 2015-03-11 (×4): qty 1

## 2015-03-11 MED ORDER — LORAZEPAM 2 MG/ML IJ SOLN
1.0000 mg | Freq: Four times a day (QID) | INTRAMUSCULAR | Status: DC | PRN
  Administered 2015-03-14: 1 mg via INTRAVENOUS
  Filled 2015-03-11: qty 1

## 2015-03-11 MED ORDER — ASPIRIN 325 MG PO TABS
325.0000 mg | ORAL_TABLET | Freq: Once | ORAL | Status: AC
  Administered 2015-03-11: 325 mg via ORAL
  Filled 2015-03-11: qty 1

## 2015-03-11 MED ORDER — FUROSEMIDE 10 MG/ML IJ SOLN
80.0000 mg | Freq: Once | INTRAMUSCULAR | Status: AC
  Administered 2015-03-11: 80 mg via INTRAVENOUS
  Filled 2015-03-11: qty 8

## 2015-03-11 NOTE — ED Provider Notes (Signed)
CSN: [id number]     Arrival date & time 03/11/15  1813 History   First MD Initiated Contact with Patient 03/11/15 2113     Chief Complaint  Patient presents with  . Shortness of Breath     (Consider location/radiation/quality/duration/timing/severity/associated sxs/prior Treatment) Patient is a 44 y.o. male presenting with shortness of breath.  Shortness of Breath Severity:  Moderate Onset quality:  Gradual Duration:  2 weeks Timing:  Constant Progression:  Worsening Chronicity:  New Context: not URI   Relieved by:  Nothing Worsened by:  Exertion and movement Ineffective treatments:  None tried Associated symptoms: no abdominal pain, no diaphoresis, no fever, no rash, no sputum production and no vomiting     Past Medical History  Diagnosis Date  . Gout   . Diverticulitis   . GERD (gastroesophageal reflux disease)   . Hypertension     Does not see a cardiologist  . Anxiety   . Panic attacks   . Asthma     reports related to anxiety  . History of Clostridium difficile infection   . COPD (chronic obstructive pulmonary disease)   . Alcohol abuse   . Hypercholesterolemia   . Pneumonia   . Chronic bronchitis   . Migraine     "new onset" (03/11/2015)  . Arthritis     "hips, knees, back" (03/11/2015)  . Chronic lower back pain   . Chronic hip pain     "both"   Past Surgical History  Procedure Laterality Date  . Appendectomy    . Mandible surgery Left 2010    "dry skin pocket cut out"  . Flexible sigmoidoscopy N/A 02/28/2013    Procedure: FLEXIBLE SIGMOIDOSCOPY;  Surgeon: Winfield Cunas., MD;  Location: [facility] ENDOSCOPY;  Service: Endoscopy;  Laterality: N/A;  . Colonoscopy with propofol N/A 04/01/2013    Procedure: COLONOSCOPY WITH PROPOFOL;  Surgeon: Arta Silence, MD;  Location: [facility] ENDOSCOPY;  Service: Endoscopy;  Laterality: N/A;  . Laparoscopic low anterior resection N/A 07/08/2013    Procedure: LAPAROSCOPIC LOW ANTERIOR RESECTION WITH TAKEDOWN OF SPLENIC FLEXURE.;   Surgeon: Adin Hector, MD;  Location: [facility] OR;  Service: General;  Laterality: N/A;   Family History  Problem Relation Age of Onset  . Cancer Father     lymphoma   Social History  Substance Use Topics  . Smoking status: Current Every Day Smoker -- 1.50 packs/day for 30 years    Types: Cigarettes  . Smokeless tobacco: Former Systems developer    Types: Chew     Comment: "quit chewing in the 1990's"  . Alcohol Use: 1.2 oz/week    2 Shots of liquor per week    Review of Systems  Constitutional: Negative for fever and diaphoresis.  Respiratory: Positive for shortness of breath. Negative for sputum production.   Gastrointestinal: Negative for vomiting and abdominal pain.  Skin: Negative for rash.  All other systems reviewed and are negative.     Allergies  Morphine and related  Home Medications   Prior to Admission medications   Medication Sig Start Date End Date Taking? Authorizing Provider  albuterol (PROVENTIL HFA;VENTOLIN HFA) 108 (90 BASE) MCG/ACT inhaler Inhale 1-2 puffs into the lungs every 6 (six) hours as needed for wheezing or shortness of breath. 01/13/15  Yes [name], MD  allopurinol (ZYLOPRIM) 300 MG tablet Take 1 tablet (300 mg total) by mouth every evening. 08/05/13  Yes Reyne Dumas, MD  diazepam (VALIUM) 5 MG tablet Take 5 mg by mouth daily as needed for  anxiety.   Yes Historical Provider, MD  indomethacin (INDOCIN) 25 MG capsule Take 1 capsule (25 mg total) by mouth 3 (three) times daily as needed. For gout flare up. 01/13/15  Yes [name], MD  amLODipine (NORVASC) 5 MG tablet Take 1 tablet (5 mg total) by mouth daily. 03/14/15   Ripudeep Krystal Eaton, MD  aspirin 325 MG tablet Take 1 tablet (325 mg total) by mouth daily. 03/14/15   Ripudeep Krystal Eaton, MD  atorvastatin (LIPITOR) 40 MG tablet Take 1 tablet (40 mg total) by mouth every evening. Please substitute with any cheaper generic option 03/14/15   Ripudeep Krystal Eaton, MD  famotidine (PEPCID) 20 MG tablet Take 1 tablet (20 mg total) by  mouth daily. 03/14/15   Ripudeep Krystal Eaton, MD  furosemide (LASIX) 40 MG tablet Take 1 tablet (40 mg total) by mouth daily. 03/14/15   Ripudeep Krystal Eaton, MD  gabapentin (NEURONTIN) 300 MG capsule Take 1 capsule (300 mg total) by mouth 2 (two) times daily. 03/14/15   Ripudeep Krystal Eaton, MD  HYDROcodone-acetaminophen (NORCO/VICODIN) 5-325 MG per tablet Take 1-2 tablets by mouth every 6 (six) hours as needed for moderate pain or severe pain. 03/14/15   Ripudeep Krystal Eaton, MD  lisinopril (PRINIVIL,ZESTRIL) 40 MG tablet Take 1 tablet (40 mg total) by mouth daily. 03/14/15   Ripudeep Krystal Eaton, MD  metoprolol tartrate (LOPRESSOR) 25 MG tablet Take 1 tablet (25 mg total) by mouth 2 (two) times daily. 03/14/15   Ripudeep Krystal Eaton, MD  nicotine (NICODERM CQ - DOSED IN MG/24 HOURS) 21 mg/24hr patch Place 1 patch (21 mg total) onto the skin daily. 03/14/15   Ripudeep K Rai, MD   BP 141/45 mmHg  Pulse 71  Temp(Src) 97.9 F (36.6 C) (Oral)  Resp 21  Ht 6' 2.5" (1.892 m)  Wt 278 lb 8 oz (126.327 kg)  BMI 35.29 kg/m2  SpO2 96% Physical Exam  Constitutional: He is oriented to person, place, and time. He appears well-developed and well-nourished.  HENT:  Head: Normocephalic and atraumatic.  Eyes: Conjunctivae and EOM are normal.  Neck: Normal range of motion. Neck supple.  Cardiovascular: Normal rate, regular rhythm and normal heart sounds.   Pulmonary/Chest: Effort normal. No respiratory distress. He has rales in the right lower field and the left lower field.  Abdominal: He exhibits no distension. There is no tenderness. There is no rebound and no guarding.  Musculoskeletal: Normal range of motion.       Right lower leg: He exhibits edema.       Left lower leg: He exhibits edema.  Neurological: He is alert and oriented to person, place, and time.  Skin: Skin is warm and dry.  Vitals reviewed.   ED Course  Procedures (including critical care time) Labs Review Labs Reviewed  BASIC METABOLIC PANEL - Abnormal; Notable for the  following:    Glucose, Bld 101 (*)    BUN 5 (*)    All other components within normal limits  CBC - Abnormal; Notable for the following:    MCV 100.2 (*)    MCH 34.2 (*)    All other components within normal limits  TROPONIN I - Abnormal; Notable for the following:    Troponin I 0.04 (*)    All other components within normal limits  URINE RAPID DRUG SCREEN, HOSP PERFORMED - Abnormal; Notable for the following:    Opiates POSITIVE (*)    Benzodiazepines POSITIVE (*)    Tetrahydrocannabinol POSITIVE (*)  All other components within normal limits  TROPONIN I - Abnormal; Notable for the following:    Troponin I 0.04 (*)    All other components within normal limits  TROPONIN I - Abnormal; Notable for the following:    Troponin I 0.04 (*)    All other components within normal limits  TROPONIN I - Abnormal; Notable for the following:    Troponin I 0.05 (*)    All other components within normal limits  LIPID PANEL - Abnormal; Notable for the following:    Cholesterol 258 (*)    Triglycerides 476 (*)    HDL 40 (*)    All other components within normal limits  TSH - Abnormal; Notable for the following:    TSH 7.308 (*)    All other components within normal limits  BASIC METABOLIC PANEL - Abnormal; Notable for the following:    Glucose, Bld 112 (*)    All other components within normal limits  MAGNESIUM - Abnormal; Notable for the following:    Magnesium 1.6 (*)    All other components within normal limits  BASIC METABOLIC PANEL - Abnormal; Notable for the following:    Glucose, Bld 112 (*)    All other components within normal limits  BASIC METABOLIC PANEL - Abnormal; Notable for the following:    Chloride 98 (*)    BUN 5 (*)    All other components within normal limits  MRSA PCR SCREENING  BRAIN NATRIURETIC PEPTIDE  HIV ANTIBODY (ROUTINE TESTING)  PROTIME-INR  APTT  HEMOGLOBIN A1C  T3  T4, FREE  D-DIMER, QUANTITATIVE (NOT AT [facility])    Imaging Review No results  found. IDebby Freiberg, personally reviewed and evaluated these images and lab results as part of my medical decision-making.   EKG Interpretation   Date/Time:  Friday March 11 2015 18:30:55 EDT Ventricular Rate:  102 PR Interval:  166 QRS Duration: 108 QT Interval:  372 QTC Calculation: 484 R Axis:   128 Text Interpretation:  Sinus tachycardia Right axis deviation Incomplete  right bundle branch block Abnormal ECG No significant change since last  tracing Confirmed by Debby Freiberg [phone]) on 03/11/2015 9:49:34 PM      CRITICAL CARE Performed by: Debby Freiberg   Total critical care time: 35 min  Critical care time was exclusive of separately billable procedures and treating other patients.  Critical care was necessary to treat or prevent imminent or life-threatening deterioration.  Critical care was time spent personally by me on the following activities: development of treatment plan with patient and/or surrogate as well as nursing, discussions with consultants, evaluation of patient's response to treatment, examination of patient, obtaining history from patient or surrogate, ordering and performing treatments and interventions, ordering and review of laboratory studies, ordering and review of radiographic studies, pulse oximetry and re-evaluation of patient's condition.   MDM   Final diagnoses:  Edema    44 y.o. male with pertinent PMH of HTN (noncompliant) presents with dyspnea on exertion, bil leg swelling. .On arrival vital signs and physical exam as above.  Exam is concerning for new onset heart failure in a patient with nontreated hypertension. This raises concern for hypertensive emergency. Workup obtained as above. Positive troponin. No chest pain and no new symptoms within the last 6 hours. Suspect likely hypertensive emergency. Consulted medicine for admission.    I have reviewed all laboratory and imaging studies if ordered as above  1. Hypertensive  emergency    Debby Freiberg, MD 03/14/15 1815

## 2015-03-11 NOTE — ED Notes (Signed)
Report attempted. RN taking patient not ready.

## 2015-03-11 NOTE — H&P (Addendum)
[facility] History and Physical  Preston [name] HMC:[id number] DOB: Feb 02, 1971 DOA: 03/11/2015  Referring physician: ED physician PCP: No PCP Per Patient  Specialists:   Chief Complaint:  Bilateral leg edema, leg pain, shortness of breath  HPI: Preston [name] is a 44 y.o. male with PMH of tobacco abuse, alcohol abuse, hypertension, hyperlipidemia, GERD, gout, anxiety, C. difficile colitis, panic attack, asthma, COPD, who presents with the bilateral leg edema, leg pain, shortness of breath.  Patient reports that he has not been taking his medications for more than 8 months because of financial difficulty. He developed a worsening leg edema, leg pain or shortness of breath in the past 7 days, which has been progressively getting worse. He has mild cough with white colored mucus production, no fever or chills. He reports having diarrhea in the past 3 days, no recent antibodies use, no abdominal pain. He had 3 bowel movement with loose stool today. He states that he had several episodes of chest pain in the past several days, but currently no chest pain. He also reports having chronic bilateral leg tingling sensations which has not changed recently. Patient does not have symptoms of UTI, rashes, unilateral weakness.  In ED, patient was found to have elevated troponin 0.04, WBC 9.7, temperature normal, tachycardia, electrolytes okay, negative chest x-ray for acute seminoma and his. Patient is admitted to inpatient for further evaluation and treatment.  Where does patient live?   At home    Can patient participate in ADLs?  Yes      Review of Systems:   General: no fevers, chills, no changes in body weight, has poor appetite, has fatigue HEENT: no blurry vision, hearing changes or sore throat Pulm: has dyspnea, coughing, no wheezing CV: no chest pain, palpitations Abd: no nausea, vomiting, abdominal pain, diarrhea, constipation GU: no dysuria, burning on urination, increased urinary  frequency, hematuria  Ext: has leg edema Neuro: no unilateral weakness, numbness, or tingling, no vision change or hearing loss Skin: no rash MSK: No muscle spasm, no deformity, no limitation of range of movement in spin Heme: No easy bruising.  Travel history: No recent long distant travel.  Allergy:  Allergies  Allergen Reactions  . Morphine And Related Itching, Nausea And Vomiting and Other (See Comments)    "bad feeling"    Past Medical History  Diagnosis Date  . Gout   . Diverticulitis   . GERD (gastroesophageal reflux disease)   . Hypertension     Does not see a cardiologist  . Anxiety   . Panic attacks   . Asthma     reports related to anxiety  . History of Clostridium difficile infection   . COPD (chronic obstructive pulmonary disease)   . Alcohol abuse   . Hypercholesterolemia   . Pneumonia   . Chronic bronchitis   . Migraine     "new onset" (03/11/2015)  . Arthritis     "hips, knees, back" (03/11/2015)  . Chronic lower back pain   . Chronic hip pain     "both"    Past Surgical History  Procedure Laterality Date  . Appendectomy    . Mandible surgery Left 2010    "dry skin pocket cut out"  . Flexible sigmoidoscopy N/A 02/28/2013    Procedure: FLEXIBLE SIGMOIDOSCOPY;  Surgeon: Winfield Cunas., MD;  Location: [facility] ENDOSCOPY;  Service: Endoscopy;  Laterality: N/A;  . Colonoscopy with propofol N/A 04/01/2013    Procedure: COLONOSCOPY WITH PROPOFOL;  Surgeon: Arta Silence, MD;  Location: [facility] ENDOSCOPY;  Service: Endoscopy;  Laterality: N/A;  . Laparoscopic low anterior resection N/A 07/08/2013    Procedure: LAPAROSCOPIC LOW ANTERIOR RESECTION WITH TAKEDOWN OF SPLENIC FLEXURE.;  Surgeon: Adin Hector, MD;  Location: [place];  Service: General;  Laterality: N/A;    Social History:  reports that he has been smoking Cigarettes.  He has a 45 pack-year smoking history. He has quit using smokeless tobacco. His smokeless tobacco use included Chew. He reports that he  drinks about 1.2 oz of alcohol per week. He reports that he does not use illicit drugs.  Family History:  Family History  Problem Relation Age of Onset  . Cancer Father     lymphoma     Prior to Admission medications   Medication Sig Start Date End Date Taking? Authorizing Provider  albuterol (PROVENTIL HFA;VENTOLIN HFA) 108 (90 BASE) MCG/ACT inhaler Inhale 1-2 puffs into the lungs every 6 (six) hours as needed for wheezing or shortness of breath. 01/13/15   [name], MD  allopurinol (ZYLOPRIM) 300 MG tablet Take 1 tablet (300 mg total) by mouth every evening. Patient not taking: Reported on 01/13/2015 08/05/13   Reyne Dumas, MD  buPROPion [facility] SR) 150 MG 12 hr tablet Take 1 tablet (150 mg total) by mouth 2 (two) times daily. 08/05/13 09/23/13  Reyne Dumas, MD  calcium carbonate (TUMS - DOSED IN MG ELEMENTAL CALCIUM) 500 MG chewable tablet Chew 1 tablet by mouth 3 (three) times daily as needed for indigestion or heartburn.    Historical Provider, MD  colchicine 0.6 MG tablet Take 0.6 mg by mouth every hour as needed (can take hourly for up to 8 doses for a severe gout flare up).  04/20/13   Verlee Monte, MD  diazepam (VALIUM) 5 MG tablet Take 5 mg by mouth daily as needed for anxiety.    Historical Provider, MD  Fructose-Dextrose-Phosphor Acd (ANTI-NAUSEA PO) Take 5.5 mLs by mouth daily as needed (for nausea).    Historical Provider, MD  hydrochlorothiazide (HYDRODIURIL) 25 MG tablet Take 1 tablet (25 mg total) by mouth daily. Patient not taking: Reported on 01/13/2015 08/05/13   Reyne Dumas, MD  HYDROcodone-acetaminophen (NORCO/VICODIN) 5-325 MG per tablet Take 1-2 tablets by mouth every 4 (four) hours as needed for moderate pain or severe pain. 01/13/15   [name], MD  ibuprofen (ADVIL,MOTRIN) 200 MG tablet Take 800 mg by mouth every 6 (six) hours as needed for headache or moderate pain.    Historical Provider, MD  indomethacin (INDOCIN) 25 MG capsule Take 1 capsule (25 mg total) by mouth  3 (three) times daily as needed. For gout flare up. 01/13/15   [name], MD  lisinopril (PRINIVIL,ZESTRIL) 40 MG tablet Take 1 tablet (40 mg total) by mouth daily. Patient not taking: Reported on 01/13/2015 08/05/13   Reyne Dumas, MD  omeprazole (PRILOSEC OTC) 20 MG tablet Take 20 mg by mouth 2 (two) times daily.    Historical Provider, MD  oxyCODONE-acetaminophen (PERCOCET) 10-325 MG per tablet Take 1 tablet by mouth every 6 (six) hours as needed for pain. Patient not taking: Reported on 01/13/2015 06/29/13   Fanny Skates, MD  predniSONE (DELTASONE) 20 MG tablet Take 3 tablets (60 mg total) by mouth daily. 01/13/15   [name], MD  Probiotic Product (PROBIOTIC PO) Take 1 capsule by mouth daily.    Historical Provider, MD  simvastatin (ZOCOR) 40 MG tablet Take 1 tablet (40 mg total) by mouth at bedtime. Patient not taking: Reported on 01/13/2015  08/06/13   Reyne Dumas, MD    Physical Exam: Filed Vitals:   03/11/15 2330 03/12/15 0000 03/12/15 0530 03/12/15 0543  BP:  186/122 170/114   Pulse:  96 87   Temp:  98 F (36.7 C) 97.7 F (36.5 C)   TempSrc:  Oral Oral   Resp:  18 15   Height: 6' 2.5" (1.892 m)     Weight: 126.508 kg (278 lb 14.4 oz)     SpO2:  97% 97% 97%   General: Not in acute distress HEENT:       Eyes: PERRL, EOMI, no scleral icterus.       ENT: No discharge from the ears and nose, no pharynx injection, no tonsillar enlargement. Poor dentition       Neck: +JVD, no bruit, no mass felt. Heme: No neck lymph node enlargement. Cardiac: S1/S2, RRR, No murmurs, No gallops or rubs. Pulm:  No rales, wheezing, rhonchi or rubs. Abd: Soft, nondistended, nontender, no rebound pain, no organomegaly, BS present. Ext: 2+ pitting leg edema bilaterally. 2+DP/PT pulse bilaterally. Musculoskeletal: No joint deformities, No joint redness or warmth, no limitation of ROM in spin. Skin: No rashes.  Neuro: Alert, oriented X3, cranial nerves II-XII grossly intact, muscle strength 5/5 in all  extremities, sensation to light touch intact.  Psych: Patient is not psychotic, no suicidal or hemocidal ideation.  Labs on Admission:  Basic Metabolic Panel:  Recent Labs Lab 03/11/15 1843  NA 141  K 4.1  CL 104  CO2 23  GLUCOSE 101*  BUN 5*  CREATININE 0.83  CALCIUM 10.2   Liver Function Tests: No results for input(s): AST, ALT, ALKPHOS, BILITOT, PROT, ALBUMIN in the last 168 hours. No results for input(s): LIPASE, AMYLASE in the last 168 hours. No results for input(s): AMMONIA in the last 168 hours. CBC:  Recent Labs Lab 03/11/15 1843  WBC 9.7  HGB 16.4  HCT 48.1  MCV 100.2*  PLT 252   Cardiac Enzymes:  Recent Labs Lab 03/11/15 1843 03/12/15 0044  TROPONINI 0.04* 0.04*    BNP (last 3 results)  Recent Labs  03/11/15 1843  BNP 34.3    ProBNP (last 3 results) No results for input(s): PROBNP in the last 8760 hours.  CBG: No results for input(s): GLUCAP in the last 168 hours.  Radiological Exams on Admission: Dg Chest 2 View  03/11/2015   CLINICAL DATA:  Shortness of breath, edema  EXAM: CHEST  2 VIEW  COMPARISON:  01/13/2015  FINDINGS: Cardiomediastinal silhouette is stable. Mild hyperinflation again noted. No acute infiltrate or pleural effusion. No pulmonary edema. Stable degenerative changes thoracic spine.  IMPRESSION: No active cardiopulmonary disease.   Electronically Signed   By: Lahoma Crocker M.D.   On: 03/11/2015 19:38    EKG: Independently reviewed.  Abnormal findings:  RAD, no ischemic changes, QTC 484.   Assessment/Plan Principal Problem:   SOB (shortness of breath) Active Problems:   Gout   Hypertensive emergency   Tobacco abuse   Diverticulitis   HLD (hyperlipidemia)   GERD (gastroesophageal reflux disease)   Depression   Bilateral leg edema   Diarrhea   Alcohol abuse   Elevated troponin   COPD (chronic obstructive pulmonary disease)  SOB (shortness of breath): It is likely due to acute congestive heart failure secondary to  hypertensive emergency. In consistent with this diagnosis,  patient has bilateral leg edema and positive JVD. His blood pressure is significantly elevated at 185/123. His low BNP 34.4 is likely falsely low  due to obesity.   -will admit to tele bed -received one dose of lasix, 80 mg x 1 IV in Ed, will continue lasix at 40 mg daily -start IV nitroglycerin drip -ASA , lisinopril -will cycle CE X3 -will check TSH, A1c, FLP, UDS -will get 2-D echo to evaluate EF -strict In/Out -Daily body weight. -NPO now -Not on BB, will add one after CHF exacerbation resolves  Hypertensive emergency: Patient received one dose of labetalol 10 mg 1, Lasix 80 mg 1 in emergency room. Currently blood pressure is 176/115. -see above   Gout: stable. -Continue allopurinol and indomethacin  Tobacco abuse and Alcohol abuse: -Did counseling about importance of quitting smoking -Nicotine patch -Did counseling about the importance of quitting drinking -CIWA protocol  HLD: Last LDL was 134 on 08/05/13 -Lipitor 40 mg daily -Check FLP  GERD: -Pepcid  Diarrhea: Etiology is not clear. Patient has history of C. difficile colitis. No recent antibodies use, but still need to rule out this possibility. -C. difficile PCR, GI pathogen panel  Anxiety: Stable, no suicidal or homicidal ideations. -Restart Wellbutrin  Elevated troponin: Likely due to demand ischemic secondary to hypertensive emergency. Currently patient does not have chest pain EKG has no ischemic change. -follow trop x 3 -ASA, lipitor, prn dilaudid  COPD: No signs of acute exacerbation. Chest x-ray is negative. -When necessary albuterol Nebs   DVT ppx: SQ Heparin    Code Status: Full code Family Communication: Yes, patient's wife at bed side Disposition Plan: Admit to inpatient   Date of Service 03/12/2015    Ivor Costa [facility] Pager [phone]  If 7PM-7AM, please contact night-coverage [email or web address] Password [facility] 03/12/2015,  6:11 AM

## 2015-03-11 NOTE — ED Notes (Signed)
The pt is c/o bi-lateral leg pain and swelling for 7 days  Getting worse he has swelling all over his body now with sob .  Pain in his legs.  He stopped taking his bp med 6 months ago.  No insurance..  No chest pain hx copd and asthma

## 2015-03-12 ENCOUNTER — Inpatient Hospital Stay (HOSPITAL_COMMUNITY): Payer: Medicaid Other

## 2015-03-12 DIAGNOSIS — J449 Chronic obstructive pulmonary disease, unspecified: Secondary | ICD-10-CM | POA: Diagnosis present

## 2015-03-12 DIAGNOSIS — R600 Localized edema: Secondary | ICD-10-CM

## 2015-03-12 DIAGNOSIS — R0602 Shortness of breath: Secondary | ICD-10-CM

## 2015-03-12 DIAGNOSIS — R0600 Dyspnea, unspecified: Secondary | ICD-10-CM

## 2015-03-12 DIAGNOSIS — R7989 Other specified abnormal findings of blood chemistry: Secondary | ICD-10-CM

## 2015-03-12 DIAGNOSIS — F1010 Alcohol abuse, uncomplicated: Secondary | ICD-10-CM

## 2015-03-12 DIAGNOSIS — J42 Unspecified chronic bronchitis: Secondary | ICD-10-CM

## 2015-03-12 LAB — MRSA PCR SCREENING: MRSA by PCR: NEGATIVE

## 2015-03-12 LAB — BASIC METABOLIC PANEL
ANION GAP: 11 (ref 5–15)
BUN: 6 mg/dL (ref 6–20)
CALCIUM: 9.3 mg/dL (ref 8.9–10.3)
CHLORIDE: 102 mmol/L (ref 101–111)
CO2: 25 mmol/L (ref 22–32)
CREATININE: 0.92 mg/dL (ref 0.61–1.24)
Glucose, Bld: 112 mg/dL — ABNORMAL HIGH (ref 65–99)
POTASSIUM: 4 mmol/L (ref 3.5–5.1)
SODIUM: 138 mmol/L (ref 135–145)

## 2015-03-12 LAB — TROPONIN I
TROPONIN I: 0.05 ng/mL — AB (ref <0.031)
Troponin I: 0.04 ng/mL — ABNORMAL HIGH (ref <0.031)
Troponin I: 0.04 ng/mL — ABNORMAL HIGH (ref <0.031)

## 2015-03-12 LAB — PROTIME-INR
INR: 1.1 (ref 0.00–1.49)
PROTHROMBIN TIME: 14.4 s (ref 11.6–15.2)

## 2015-03-12 LAB — RAPID URINE DRUG SCREEN, HOSP PERFORMED
Amphetamines: NOT DETECTED
BENZODIAZEPINES: POSITIVE — AB
Barbiturates: NOT DETECTED
COCAINE: NOT DETECTED
OPIATES: POSITIVE — AB
TETRAHYDROCANNABINOL: POSITIVE — AB

## 2015-03-12 LAB — MAGNESIUM: Magnesium: 1.6 mg/dL — ABNORMAL LOW (ref 1.7–2.4)

## 2015-03-12 LAB — APTT: aPTT: 28 seconds (ref 24–37)

## 2015-03-12 LAB — LIPID PANEL
CHOL/HDL RATIO: 6.5 ratio
CHOLESTEROL: 258 mg/dL — AB (ref 0–200)
HDL: 40 mg/dL — AB (ref >40)
LDL CALC: UNDETERMINED mg/dL (ref 0–99)
Triglycerides: 476 mg/dL — ABNORMAL HIGH (ref <150)
VLDL: UNDETERMINED mg/dL (ref 0–40)

## 2015-03-12 LAB — D-DIMER, QUANTITATIVE: D-Dimer, Quant: <0.27 ug/mL-FEU (ref 0.00–0.48)

## 2015-03-12 LAB — T4, FREE: Free T4: 0.86 ng/dL (ref 0.61–1.12)

## 2015-03-12 LAB — TSH: TSH: 7.308 u[IU]/mL — AB (ref 0.350–4.500)

## 2015-03-12 MED ORDER — METOPROLOL TARTRATE 25 MG PO TABS
25.0000 mg | ORAL_TABLET | Freq: Two times a day (BID) | ORAL | Status: DC
  Administered 2015-03-12 – 2015-03-14 (×5): 25 mg via ORAL
  Filled 2015-03-12 (×5): qty 1

## 2015-03-12 MED ORDER — ALBUTEROL SULFATE (2.5 MG/3ML) 0.083% IN NEBU
2.5000 mg | INHALATION_SOLUTION | Freq: Four times a day (QID) | RESPIRATORY_TRACT | Status: DC | PRN
Start: 2015-03-12 — End: 2015-03-14
  Administered 2015-03-12 – 2015-03-14 (×6): 2.5 mg via RESPIRATORY_TRACT
  Filled 2015-03-12 (×6): qty 3

## 2015-03-12 MED ORDER — MAGNESIUM SULFATE 50 % IJ SOLN
3.0000 g | Freq: Once | INTRAVENOUS | Status: AC
  Administered 2015-03-12: 3 g via INTRAVENOUS
  Filled 2015-03-12 (×2): qty 6

## 2015-03-12 MED ORDER — LORAZEPAM 2 MG/ML IJ SOLN
0.0000 mg | Freq: Four times a day (QID) | INTRAMUSCULAR | Status: AC
Start: 2015-03-12 — End: 2015-03-14
  Administered 2015-03-12: 2 mg via INTRAVENOUS
  Filled 2015-03-12: qty 1

## 2015-03-12 MED ORDER — FAMOTIDINE 20 MG PO TABS
20.0000 mg | ORAL_TABLET | Freq: Every day | ORAL | Status: DC
  Administered 2015-03-12 – 2015-03-14 (×3): 20 mg via ORAL
  Filled 2015-03-12 (×3): qty 1

## 2015-03-12 MED ORDER — ALLOPURINOL 300 MG PO TABS
300.0000 mg | ORAL_TABLET | Freq: Every evening | ORAL | Status: DC
  Administered 2015-03-12 – 2015-03-13 (×2): 300 mg via ORAL
  Filled 2015-03-12 (×3): qty 1

## 2015-03-12 MED ORDER — CHLORHEXIDINE GLUCONATE CLOTH 2 % EX PADS
6.0000 | MEDICATED_PAD | Freq: Once | CUTANEOUS | Status: AC
  Administered 2015-03-12: 6 via TOPICAL

## 2015-03-12 MED ORDER — LORAZEPAM 2 MG/ML IJ SOLN: 0.0000 mg | Freq: Two times a day (BID) | INTRAMUSCULAR | Status: DC

## 2015-03-12 NOTE — Progress Notes (Signed)
UR COMPLETED  

## 2015-03-12 NOTE — Progress Notes (Signed)
[facility]                                                                              Patient Demographics  Preston [name], is a 44 y.o. male, DOB - 15-Oct-1970, TGY:[id number]  Admit date - 03/11/2015   Admitting Physician Ivor Costa, MD  Outpatient Primary MD for the patient is No PCP Per Patient  LOS - 1   Chief Complaint  Patient presents with  . Shortness of Breath       Brief HPI  Preston [name] is a 44 y.o. male with PMH of tobacco abuse, alcohol abuse, hypertension, hyperlipidemia, GERD, gout, anxiety, C. difficile colitis, panic attack, asthma, COPD, who presents with the bilateral leg edema, leg pain, shortness of breath. Patient reports that he had not been taking his medications for more than 8 months because of financial difficulty. He developed a worsening leg edema, leg pain or shortness of breath in the past 7 days, which has been progressively getting worse. He has mild cough with white colored mucus production, no fever or chills. He reported having diarrhea in the past 3 days, no recent antibodies use, no abdominal pain. He had 3 bowel movement with loose stool today. He reported that he had several episodes of chest pain in the past several days, but currently no chest pain. He also reports having chronic bilateral leg tingling sensations which has not changed recently. Patient does not have symptoms of UTI, rashes, unilateral weakness. In ED, patient was found to have elevated troponin 0.04, WBC 9.7, temperature normal, tachycardia, electrolytes okay, negative chest x-ray for acute seminoma and his. Patient is admitted to inpatient for further evaluation and treatment.   Assessment & Plan    Principal Problem: SOB (shortness of breath): Likely due to acute diastolic CHF secondary to uncontrolled hypertension. BP was 185/123 at the time of admission. - Continue IV Lasix, placed on heart healthy diet with fluid restriction, continue strict I's and  O's, daily weights - 2-D echo ordered this morning, showed EF of 55-60% with grade 2 diastolic dysfunction - Troponins flat 0.04 likely due to acute diastolic CHF versus demand ischemia, patient has no chest pain, will check d-dimer - Continue aspirin, statin, wean off nitroglycerin drip, IV Lasix, placed on beta blocker, lisinopril  Active problems Hypertensive emergency: Patient received one dose of labetalol 10 mg 1, Lasix 80 mg 1 in emergency room.  - Patient will benefit from genetic or cheaper antihypertensives, continue to titrate up antihypertensives to control BP  Bilateral leg pains: Patient complaining of worsening pains in the legs on walking, improves on resting. - Obtain Doppler ultrasound of the lower extremity and ABIs  Gout: stable. -Continue allopurinol and indomethacin  Tobacco abuse and Alcohol abuse: -Patient counseled strongly on quitting smoking and alcohol, continue nicotine patch -CIWA protocol  HLD: - Continue Lipitor - Lipid panel showed cholesterol 258, triglycerides 476  GERD: -Pepcid  Anxiety: Stable, no suicidal or homicidal ideations. -Restart Wellbutrin  COPD: No signs of acute exacerbation. Chest x-ray is negative. -When necessary albuterol Nebs  Code Status: Full code  Family Communication: Discussed in detail with the patient,  all imaging results, lab results explained to the patient and wife at the bedside   Disposition Plan: Not medically ready  Time Spent in minutes  25 minutes  Procedures  echo  Consults   None   DVT Prophylaxis  heparin   Medications  Scheduled Meds: . allopurinol  300 mg Oral QPM  . aspirin  325 mg Oral Daily  . atorvastatin  40 mg Oral q1800  . buPROPion  150 mg Oral BID  . famotidine  20 mg Oral Daily  . folic acid  1 mg Oral Daily  . furosemide  40 mg Intravenous Daily  . heparin  5,000 Units Subcutaneous 3 times per day  . lisinopril  40 mg Oral Daily  . LORazepam  0-4 mg Intravenous Q6H    Followed by  . [START ON 03/14/2015] LORazepam  0-4 mg Intravenous Q12H  . magnesium sulfate LVP 250-500 ml  3 g Intravenous Once  . metoprolol tartrate  25 mg Oral BID  . multivitamin with minerals  1 tablet Oral Daily  . nicotine  21 mg Transdermal Daily  . sodium chloride  3 mL Intravenous Q12H  . thiamine  100 mg Oral Daily   Or  . thiamine  100 mg Intravenous Daily   Continuous Infusions: . nitroGLYCERIN 5 mcg/min (03/12/15 0036)   PRN Meds:.sodium chloride, acetaminophen, albuterol, HYDROcodone-acetaminophen, HYDROmorphone (DILAUDID) injection, indomethacin, LORazepam **OR** LORazepam, sodium chloride   Antibiotics   Anti-infectives    None        Subjective:   Preston [name] was seen and examined today. Complains of numbness and tingling in both feet and pain in the legs on walking. No chest pain currently shortness of breath is improving BP still elevated. Patient denies dizziness, chest pain, shortness of breath, abdominal pain, N/V/D/C. No acute events overnight.    Objective:   Blood pressure 170/114, pulse 87, temperature 97.7 F (36.5 C), temperature source Oral, resp. rate 15, height 6' 2.5" (1.892 m), weight 126.508 kg (278 lb 14.4 oz), SpO2 97 %.  Wt Readings from Last 3 Encounters:  03/11/15 126.508 kg (278 lb 14.4 oz)  08/27/13 115.486 kg (254 lb 9.6 oz)  08/05/13 113.671 kg (250 lb 9.6 oz)    No intake or output data in the 24 hours ending 03/12/15 1054  Exam  General: Alert and oriented x 3, NAD  HEENT:  PERRLA, EOMI, Anicteric Sclera, mucous membranes moist. Poor dentition  Neck: Supple, +JVD, no masses  CVS: S1 S2 auscultated, no rubs, murmurs or gallops. Regular rate and rhythm.  Respiratory: Clear to auscultation bilaterally, no wheezing, rales or rhonchi  Abdomen: obese Soft, nontender, nondistended, + bowel sounds  Ext: no cyanosis clubbing, 2+ edema  Neuro: AAOx3, Cr N's II- XII. Strength 5/5 upper and lower extremities  bilaterally  Skin: No rashes  Psych: Normal affect and demeanor, alert and oriented x3    Data Review   Micro Results Recent Results (from the past 240 hour(s))  MRSA PCR Screening     Status: None   Collection Time: 03/12/15 12:17 AM  Result Value Ref Range Status   MRSA by PCR NEGATIVE NEGATIVE Final    Comment:        The GeneXpert MRSA Assay (FDA approved for NASAL specimens only), is one component of a comprehensive MRSA colonization surveillance program. It is not intended to diagnose MRSA infection nor to guide or monitor treatment for MRSA infections.     Radiology Reports Dg Chest 2 View  03/11/2015  CLINICAL DATA:  Shortness of breath, edema  EXAM: CHEST  2 VIEW  COMPARISON:  01/13/2015  FINDINGS: Cardiomediastinal silhouette is stable. Mild hyperinflation again noted. No acute infiltrate or pleural effusion. No pulmonary edema. Stable degenerative changes thoracic spine.  IMPRESSION: No active cardiopulmonary disease.   Electronically Signed   By: Lahoma Crocker M.D.   On: 03/11/2015 19:38    CBC  Recent Labs Lab 03/11/15 1843  WBC 9.7  HGB 16.4  HCT 48.1  PLT 252  MCV 100.2*  MCH 34.2*  MCHC 34.1  RDW 13.7    Chemistries   Recent Labs Lab 03/11/15 1843 03/12/15 0521  NA 141 138  K 4.1 4.0  CL 104 102  CO2 23 25  GLUCOSE 101* 112*  BUN 5* 6  CREATININE 0.83 0.92  CALCIUM 10.2 9.3  MG  --  1.6*   ------------------------------------------------------------------------------------------------------------------ estimated creatinine clearance is 147.3 mL/min (by C-G formula based on Cr of 0.92). ------------------------------------------------------------------------------------------------------------------ No results for input(s): HGBA1C in the last 72 hours. ------------------------------------------------------------------------------------------------------------------  Recent Labs  03/12/15 0521  CHOL 258*  HDL 40*  LDLCALC UNABLE  TO CALCULATE IF TRIGLYCERIDE OVER 400 mg/dL  TRIG 476*  CHOLHDL 6.5   ------------------------------------------------------------------------------------------------------------------  Recent Labs  03/12/15 0521  TSH 7.308*   ------------------------------------------------------------------------------------------------------------------ No results for input(s): VITAMINB12, FOLATE, FERRITIN, TIBC, IRON, RETICCTPCT in the last 72 hours.  Coagulation profile  Recent Labs Lab 03/12/15 0044  INR 1.10    No results for input(s): DDIMER in the last 72 hours.  Cardiac Enzymes  Recent Labs Lab 03/11/15 1843 03/12/15 0044 03/12/15 0521  TROPONINI 0.04* 0.04* 0.04*   ------------------------------------------------------------------------------------------------------------------ Invalid input(s): POCBNP  No results for input(s): GLUCAP in the last 72 hours.   Kadasia Kassing M.D. [facility] 03/12/2015, 10:54 AM  Pager: [phone] Between 7am to 7pm - call Pager - [phone]  After 7pm go to [email or web address] - password [email or web address]  Call night coverage person covering after 7pm

## 2015-03-12 NOTE — Progress Notes (Signed)
  Echocardiogram 2D Echocardiogram has been performed.  Preston [name] 03/12/2015, 9:23 AM

## 2015-03-12 NOTE — Progress Notes (Signed)
Pt has had 0 BMs, abd distress or nausea, notified MD, contact orders d/cd.  Edward Qualia RN

## 2015-03-13 ENCOUNTER — Encounter (HOSPITAL_COMMUNITY): Payer: Medicaid Other

## 2015-03-13 ENCOUNTER — Inpatient Hospital Stay (HOSPITAL_COMMUNITY): Payer: Medicaid Other

## 2015-03-13 DIAGNOSIS — R609 Edema, unspecified: Secondary | ICD-10-CM

## 2015-03-13 DIAGNOSIS — F329 Major depressive disorder, single episode, unspecified: Secondary | ICD-10-CM

## 2015-03-13 DIAGNOSIS — R0602 Shortness of breath: Secondary | ICD-10-CM

## 2015-03-13 LAB — HIV ANTIBODY (ROUTINE TESTING W REFLEX): HIV SCREEN 4TH GENERATION: NONREACTIVE

## 2015-03-13 LAB — BASIC METABOLIC PANEL
ANION GAP: 11 (ref 5–15)
BUN: 7 mg/dL (ref 6–20)
CALCIUM: 9.5 mg/dL (ref 8.9–10.3)
CO2: 26 mmol/L (ref 22–32)
CREATININE: 0.78 mg/dL (ref 0.61–1.24)
Chloride: 101 mmol/L (ref 101–111)
GFR calc non Af Amer: >60 mL/min (ref >60)
Glucose, Bld: 112 mg/dL — ABNORMAL HIGH (ref 65–99)
POTASSIUM: 3.9 mmol/L (ref 3.5–5.1)
SODIUM: 138 mmol/L (ref 135–145)

## 2015-03-13 LAB — T3: T3, Total: 151 ng/dL (ref 71–180)

## 2015-03-13 MED ORDER — GABAPENTIN 100 MG PO CAPS
100.0000 mg | ORAL_CAPSULE | Freq: Two times a day (BID) | ORAL | Status: DC
  Administered 2015-03-13 – 2015-03-14 (×3): 100 mg via ORAL
  Filled 2015-03-13 (×3): qty 1

## 2015-03-13 MED ORDER — AMLODIPINE BESYLATE 5 MG PO TABS
5.0000 mg | ORAL_TABLET | Freq: Every day | ORAL | Status: DC
  Administered 2015-03-13 – 2015-03-14 (×2): 5 mg via ORAL
  Filled 2015-03-13 (×2): qty 1

## 2015-03-13 NOTE — Progress Notes (Signed)
[facility]                                                                              Patient Demographics  Preston [name], is a 44 y.o. male, DOB - October 10, 1970, RSW:[id number]  Admit date - 03/11/2015   Admitting Physician Ivor Costa, MD  Outpatient Primary MD for the patient is No PCP Per Patient  LOS - 2   Chief Complaint  Patient presents with  . Shortness of Breath       Brief HPI  BLAIZE [name] is a 44 y.o. male with PMH of tobacco abuse, alcohol abuse, hypertension, hyperlipidemia, GERD, gout, anxiety, C. difficile colitis, panic attack, asthma, COPD, who presents with the bilateral leg edema, leg pain, shortness of breath. Patient reports that he had not been taking his medications for more than 8 months because of financial difficulty. He developed a worsening leg edema, leg pain or shortness of breath in the past 7 days, which has been progressively getting worse. He has mild cough with white colored mucus production, no fever or chills. He reported having diarrhea in the past 3 days, no recent antibodies use, no abdominal pain. He had 3 bowel movement with loose stool today. He reported that he had several episodes of chest pain in the past several days, but currently no chest pain. He also reports having chronic bilateral leg tingling sensations which has not changed recently. Patient does not have symptoms of UTI, rashes, unilateral weakness. In ED, patient was found to have elevated troponin 0.04, WBC 9.7, temperature normal, tachycardia, electrolytes okay, negative chest x-ray for acute seminoma and his. Patient is admitted to inpatient for further evaluation and treatment.   Assessment & Plan    Principal Problem: SOB (shortness of breath): Likely due to acute diastolic CHF secondary to uncontrolled hypertension. BP was 185/123 at the time of admission. - Significantly improving, continue IV Lasix today, consultation strongly on compliance and diet -  Troponin slightly up at 0.05, discussed with cardiology, Dr. Domenic Polite, likely due to acute diastolic CHF versus demand ischemia however patient has no chest pain or any cardiac symptoms. Per cardiology, will follow outpatient, no need of any invasive cardiac workup. 2-D echo showed preserved EF. D-dimer normal. - 2-D echo showed EF of 55-60% with grade 2 diastolic dysfunction - Continue aspirin, statin, been off the nitroglycerin drip. Continue beta blocker, lisinopril, added Norvasc 5 mg daily for BP  Active problems Hypertensive emergency: Patient received one dose of labetalol 10 mg 1, Lasix 80 mg 1 in emergency room.  - Patient will benefit from genetic or cheaper antihypertensives, continue to titrate up antihypertensives to control BP - Continue beta blocker, lisinopril, added Norvasc 5 mg daily  Bilateral leg pains: Patient complaining of worsening pains in the legs on walking, improves on resting. - Obtain Doppler ultrasound of the lower extremity and ABIs - Added Neurontin, continue pain control for now  Gout: stable. -Continue allopurinol and indomethacin  Tobacco abuse and Alcohol abuse: -Patient counseled strongly on quitting smoking and alcohol, continue nicotine patch -CIWA protocol  HLD: - Continue Lipitor - Lipid panel showed cholesterol 258, triglycerides 476  GERD: -Pepcid  Anxiety: Stable, no suicidal or homicidal ideations. -Restart Wellbutrin  COPD: No signs of acute exacerbation. Chest x-ray is negative. -When necessary albuterol Nebs  Code Status: Full code  Family Communication: Discussed in detail with the patient, all imaging results, lab results explained to the patient at the bedside   Disposition Plan: Not medically ready, pending Doppler lower extremity and ABIs  Time Spent in minutes  25 minutes  Procedures  echo  Consults   None   DVT Prophylaxis  heparin   Medications  Scheduled Meds: . allopurinol  300 mg Oral QPM  . amLODipine   5 mg Oral Daily  . aspirin  325 mg Oral Daily  . atorvastatin  40 mg Oral q1800  . buPROPion  150 mg Oral BID  . famotidine  20 mg Oral Daily  . folic acid  1 mg Oral Daily  . furosemide  40 mg Intravenous Daily  . gabapentin  100 mg Oral BID  . heparin  5,000 Units Subcutaneous 3 times per day  . lisinopril  40 mg Oral Daily  . LORazepam  0-4 mg Intravenous Q6H   Followed by  . [START ON 03/14/2015] LORazepam  0-4 mg Intravenous Q12H  . metoprolol tartrate  25 mg Oral BID  . multivitamin with minerals  1 tablet Oral Daily  . nicotine  21 mg Transdermal Daily  . sodium chloride  3 mL Intravenous Q12H  . thiamine  100 mg Oral Daily   Or  . thiamine  100 mg Intravenous Daily   Continuous Infusions:   PRN Meds:.sodium chloride, acetaminophen, albuterol, HYDROcodone-acetaminophen, HYDROmorphone (DILAUDID) injection, indomethacin, LORazepam **OR** LORazepam, sodium chloride   Antibiotics   Anti-infectives    None        Subjective:   Preston [name] was seen and examined today. No chest pain, or shortness of breath. BP significantly improved. Still complaining of pain in the feet and legs and knees. Patient denies dizziness, chest pain, shortness of breath, abdominal pain, N/V/D/C. No acute events overnight.    Objective:   Blood pressure 144/92, pulse 82, temperature 97.8 F (36.6 C), temperature source Oral, resp. rate 20, height 6' 2.5" (1.892 m), weight 126.554 kg (279 lb), SpO2 98 %.  Wt Readings from Last 3 Encounters:  03/13/15 126.554 kg (279 lb)  08/27/13 115.486 kg (254 lb 9.6 oz)  08/05/13 113.671 kg (250 lb 9.6 oz)     Intake/Output Summary (Last 24 hours) at 03/13/15 0956 Last data filed at 03/13/15 0800  Gross per 24 hour  Intake 358.83 ml  Output    775 ml  Net -416.17 ml    Exam  General: Alert and oriented x 3, NAD  HEENT:  PERRLA, EOMI, Poor dentition  Neck: Supple, +JVD, no masses  CVS: S1 S2 clear, RRR  Respiratory: CTAB  Abdomen:  obese Soft, nontender, nondistended, + bowel sounds  Ext: no cyanosis clubbing, 1+ edema  Neuro: no new deficits  Skin: No rashes  Psych: Normal affect and demeanor, alert and oriented x3    Data Review   Micro Results Recent Results (from the past 240 hour(s))  MRSA PCR Screening     Status: None   Collection Time: 03/12/15 12:17 AM  Result Value Ref Range Status   MRSA by PCR NEGATIVE NEGATIVE Final    Comment:        The GeneXpert MRSA Assay (FDA approved for NASAL specimens only), is one component of a comprehensive MRSA colonization surveillance program. It  is not intended to diagnose MRSA infection nor to guide or monitor treatment for MRSA infections.     Radiology Reports Dg Chest 2 View  03/11/2015   CLINICAL DATA:  Shortness of breath, edema  EXAM: CHEST  2 VIEW  COMPARISON:  01/13/2015  FINDINGS: Cardiomediastinal silhouette is stable. Mild hyperinflation again noted. No acute infiltrate or pleural effusion. No pulmonary edema. Stable degenerative changes thoracic spine.  IMPRESSION: No active cardiopulmonary disease.   Electronically Signed   By: Lahoma Crocker M.D.   On: 03/11/2015 19:38    CBC  Recent Labs Lab 03/11/15 1843  WBC 9.7  HGB 16.4  HCT 48.1  PLT 252  MCV 100.2*  MCH 34.2*  MCHC 34.1  RDW 13.7    Chemistries   Recent Labs Lab 03/11/15 1843 03/12/15 0521 03/13/15 0550  NA 141 138 138  K 4.1 4.0 3.9  CL 104 102 101  CO2 23 25 26   GLUCOSE 101* 112* 112*  BUN 5* 6 7  CREATININE 0.83 0.92 0.78  CALCIUM 10.2 9.3 9.5  MG  --  1.6*  --    ------------------------------------------------------------------------------------------------------------------ estimated creatinine clearance is 169.6 mL/min (by C-G formula based on Cr of 0.78). ------------------------------------------------------------------------------------------------------------------ No results for input(s): HGBA1C in the last 72  hours. ------------------------------------------------------------------------------------------------------------------  Recent Labs  03/12/15 0521  CHOL 258*  HDL 40*  LDLCALC UNABLE TO CALCULATE IF TRIGLYCERIDE OVER 400 mg/dL  TRIG 476*  CHOLHDL 6.5   ------------------------------------------------------------------------------------------------------------------  Recent Labs  03/12/15 0521  TSH 7.308*   ------------------------------------------------------------------------------------------------------------------ No results for input(s): VITAMINB12, FOLATE, FERRITIN, TIBC, IRON, RETICCTPCT in the last 72 hours.  Coagulation profile  Recent Labs Lab 03/12/15 0044  INR 1.10     Recent Labs  03/12/15 1250  DDIMER <0.27    Cardiac Enzymes  Recent Labs Lab 03/12/15 0044 03/12/15 0521 03/12/15 1050  TROPONINI 0.04* 0.04* 0.05*   ------------------------------------------------------------------------------------------------------------------ Invalid input(s): POCBNP  No results for input(s): GLUCAP in the last 72 hours.   Lyberti Thrush M.D. [facility] 03/13/2015, 9:56 AM  Pager: [phone] Between 7am to 7pm - call Pager - [phone]  After 7pm go to [email or web address] - password [email or web address]  Call night coverage person covering after 7pm

## 2015-03-13 NOTE — Care Management Note (Signed)
Case Management Note  Patient Details  Name: Preston [name] MRN: [id number] Date of Birth: 1970-10-02  Subjective/Objective:                  Bilateral leg edema, leg pain, shortness of breath Per H&P 44 y.o. male with PMH of tobacco abuse, alcohol abuse, hypertension, hyperlipidemia, GERD, gout, anxiety, C. difficile colitis, panic attack, asthma, COPD, who presents with the bilateral leg edema, leg pain, shortness of breath.  Action/Plan: CM spoke to patient at the bedside who states that he is unemployed and does not have health insurance. Pt lives with girlfriend and said that he had medicaid in the past but lost coverage in 01/2014. Pt states that he has been unable to afford medications for the last 8 months. CM asked pt if he was able to use the $4 list at any pharmacy and pt states that he was not sure. Pt used to go to [facility] but stopped going but was open to going back. CM unable to schedule appointment after hours so will have CM to address 03/14/15. Pt also asking about Applying for medicaid again and would like to speak with financial department 03/14/15. CM explained [place] program for medications not on the $4 list and pt verbalized understanding. Cm will assess for [facility] appropriateness depending on discharge medications. Pt anticipates discharge home 03/14/15. CM will follow for additional CM needs and discharge planning.   Expected Discharge Date:  03/14/15               Expected Discharge Plan:  Home/Self Care  In-House Referral:     Discharge planning Services  CM Consult, Medication Assistance, [place], [facility]  Post Acute Care Choice:    Choice offered to:     DME Arranged:    DME Agency:     HH Arranged:    HH Agency:     Status of Service:  In process, will continue to follow  Medicare Important Message Given:    Date Medicare IM Given:    Medicare IM give by:    Date Additional Medicare IM Given:    Additional Medicare Important Message give  by:     If discussed at [place] of Stay Meetings, dates discussed:    Additional Comments:  Guido Sander, RN 03/13/2015, 4:38 PM

## 2015-03-13 NOTE — Progress Notes (Signed)
Bilateral lower extremity venous duplex completed:  No evidence of DVT, superficial thrombosis, or Baker's cyst.   

## 2015-03-14 ENCOUNTER — Inpatient Hospital Stay (HOSPITAL_COMMUNITY): Payer: Medicaid Other

## 2015-03-14 DIAGNOSIS — M79609 Pain in unspecified limb: Secondary | ICD-10-CM

## 2015-03-14 LAB — HEMOGLOBIN A1C
Hgb A1c MFr Bld: 5.6 % (ref 4.8–5.6)
MEAN PLASMA GLUCOSE: 114 mg/dL

## 2015-03-14 LAB — BASIC METABOLIC PANEL
ANION GAP: 10 (ref 5–15)
BUN: 5 mg/dL — ABNORMAL LOW (ref 6–20)
CALCIUM: 9.5 mg/dL (ref 8.9–10.3)
CO2: 30 mmol/L (ref 22–32)
Chloride: 98 mmol/L — ABNORMAL LOW (ref 101–111)
Creatinine, Ser: 0.79 mg/dL (ref 0.61–1.24)
Glucose, Bld: 97 mg/dL (ref 65–99)
POTASSIUM: 3.6 mmol/L (ref 3.5–5.1)
SODIUM: 138 mmol/L (ref 135–145)

## 2015-03-14 MED ORDER — ASPIRIN 325 MG PO TABS: 325.0000 mg | ORAL_TABLET | Freq: Every day | ORAL | Status: DC

## 2015-03-14 MED ORDER — METOPROLOL TARTRATE 25 MG PO TABS: 25.0000 mg | ORAL_TABLET | Freq: Two times a day (BID) | ORAL | Status: DC

## 2015-03-14 MED ORDER — NICOTINE 21 MG/24HR TD PT24: 21.0000 mg | MEDICATED_PATCH | Freq: Every day | TRANSDERMAL | Status: DC

## 2015-03-14 MED ORDER — FUROSEMIDE 40 MG PO TABS: 40.0000 mg | ORAL_TABLET | Freq: Every day | ORAL | Status: DC

## 2015-03-14 MED ORDER — GABAPENTIN 300 MG PO CAPS: 300.0000 mg | ORAL_CAPSULE | Freq: Two times a day (BID) | ORAL | Status: DC

## 2015-03-14 MED ORDER — AMLODIPINE BESYLATE 5 MG PO TABS: 5.0000 mg | ORAL_TABLET | Freq: Every day | ORAL | Status: DC

## 2015-03-14 MED ORDER — FAMOTIDINE 20 MG PO TABS
20.0000 mg | ORAL_TABLET | Freq: Every day | ORAL | Status: DC
Start: 2015-03-14 — End: 2015-07-14

## 2015-03-14 MED ORDER — HYDROCODONE-ACETAMINOPHEN 5-325 MG PO TABS: 1.0000 | ORAL_TABLET | Freq: Four times a day (QID) | ORAL | Status: DC | PRN

## 2015-03-14 MED ORDER — LISINOPRIL 40 MG PO TABS: 40.0000 mg | ORAL_TABLET | Freq: Every day | ORAL | Status: DC

## 2015-03-14 MED ORDER — ATORVASTATIN CALCIUM 40 MG PO TABS: 40.0000 mg | ORAL_TABLET | Freq: Every evening | ORAL | Status: DC

## 2015-03-14 NOTE — Progress Notes (Signed)
VASCULAR LAB PRELIMINARY  ARTERIAL  ABI completed:  Bilateral ABI normal     RIGHT    LEFT    PRESSURE WAVEFORM  PRESSURE WAVEFORM  BRACHIAL 148 Triphasic  BRACHIAL 147 Triphasic   DP 169 Triphasic  DP 174 Triphasic   AT   AT    PT 160 Triphasic  PT 160 Triphasic   PER   PER    GREAT TOE  NA GREAT TOE  NA    RIGHT LEFT  ABI 1.14 1.18     Preston [name], RVT 03/14/2015, 11:26 AM

## 2015-03-14 NOTE — Discharge Summary (Signed)
Physician Discharge Summary   Patient ID: Preston [name] MRN: [id number] DOB/AGE: Jun 11, 1971 44 y.o.  Admit date: 03/11/2015 Discharge date: 03/14/2015  Primary Care Physician:  No PCP Per Patient  Discharge Diagnoses:   . Hypertensive emergency   Dyspnea likely due to acute diastolic CHF  . Tobacco abuse . Gout . HLD (hyperlipidemia) . GERD (gastroesophageal reflux disease) . Depression/panic attacks  . Bilateral leg edema . Diarrhea . Alcohol abuse . Elevated troponin . COPD (chronic obstructive pulmonary disease)  Consults: None   Recommendations for Outpatient Follow-up:  Patient was recommended to be compliant with his medications  Please check lipid panel, TSH in 6 weeks   DIET: Heart healthy diet    Allergies:   Allergies  Allergen Reactions  . Morphine And Related Itching, Nausea And Vomiting and Other (See Comments)    "bad feeling"     Discharge Medications:   Medication List    STOP taking these medications        buPROPion 150 MG 12 hr tablet  Commonly known as:  WELLBUTRIN SR     hydrochlorothiazide 25 MG tablet  Commonly known as:  HYDRODIURIL     ibuprofen 200 MG tablet  Commonly known as:  ADVIL,MOTRIN     oxyCODONE-acetaminophen 10-325 MG per tablet  Commonly known as:  PERCOCET     predniSONE 20 MG tablet  Commonly known as:  DELTASONE     simvastatin 40 MG tablet  Commonly known as:  ZOCOR      TAKE these medications        albuterol 108 (90 BASE) MCG/ACT inhaler  Commonly known as:  PROVENTIL HFA;VENTOLIN HFA  Inhale 1-2 puffs into the lungs every 6 (six) hours as needed for wheezing or shortness of breath.     allopurinol 300 MG tablet  Commonly known as:  ZYLOPRIM  Take 1 tablet (300 mg total) by mouth every evening.     amLODipine 5 MG tablet  Commonly known as:  NORVASC  Take 1 tablet (5 mg total) by mouth daily.     aspirin 325 MG tablet  Take 1 tablet (325 mg total) by mouth daily.     atorvastatin 40 MG  tablet  Commonly known as:  LIPITOR  Take 1 tablet (40 mg total) by mouth every evening. Please substitute with any cheaper generic option     diazepam 5 MG tablet  Commonly known as:  VALIUM  Take 5 mg by mouth daily as needed for anxiety.     famotidine 20 MG tablet  Commonly known as:  PEPCID  Take 1 tablet (20 mg total) by mouth daily.     furosemide 40 MG tablet  Commonly known as:  LASIX  Take 1 tablet (40 mg total) by mouth daily.     gabapentin 300 MG capsule  Commonly known as:  NEURONTIN  Take 1 capsule (300 mg total) by mouth 2 (two) times daily.     HYDROcodone-acetaminophen 5-325 MG per tablet  Commonly known as:  NORCO/VICODIN  Take 1-2 tablets by mouth every 6 (six) hours as needed for moderate pain or severe pain.     indomethacin 25 MG capsule  Commonly known as:  INDOCIN  Take 1 capsule (25 mg total) by mouth 3 (three) times daily as needed. For gout flare up.     lisinopril 40 MG tablet  Commonly known as:  PRINIVIL,ZESTRIL  Take 1 tablet (40 mg total) by mouth daily.     metoprolol tartrate 25  MG tablet  Commonly known as:  LOPRESSOR  Take 1 tablet (25 mg total) by mouth 2 (two) times daily.     nicotine 21 mg/24hr patch  Commonly known as:  NICODERM CQ - dosed in mg/24 hours  Place 1 patch (21 mg total) onto the skin daily.         Brief H and P: For complete details please refer to admission H and P, but in brief Preston [name] is a 44 y.o. male with PMH of tobacco abuse, alcohol abuse, hypertension, hyperlipidemia, GERD, gout, anxiety, C. difficile colitis, panic attack, asthma, COPD, who presents with the bilateral leg edema, leg pain, shortness of breath. Patient reports that he had not been taking his medications for more than 8 months because of financial difficulty. He developed a worsening leg edema, leg pain or shortness of breath in the past 7 days, which has been progressively getting worse. He has mild cough with white colored mucus  production, no fever or chills. He reported having diarrhea in the past 3 days, no recent antibodies use, no abdominal pain. He had 3 bowel movement with loose stool today. He reported that he had several episodes of chest pain in the past several days, but currently no chest pain. He also reports having chronic bilateral leg tingling sensations which has not changed recently. Patient does not have symptoms of UTI, rashes, unilateral weakness. In ED, patient was found to have elevated troponin 0.04, WBC 9.7, temperature normal, tachycardia, electrolytes okay, negative chest x-ray for acute seminoma and his. Patient is admitted to inpatient for further evaluation and treatment.  Hospital Course:  SOB (shortness of breath): Likely due to acute diastolic CHF secondary to uncontrolled hypertension. BP was 185/123 at the time of admission. Significantly improved BP 141/45 today, patient was initially placed on nitroglycerin drip. patient was counseled strongly on medical noncompliance with medications, and weight control and diet. Troponins were slightly positive at 0.05, discussed with cardiology Dr. Domenic Polite, likely due to acute diastolic CHF versus demand ischemia however patient has no chest pain or any cardiac symptoms. Patient can follow outpatient, no need of any invasive cardiac workup. D-dimer normal. - 2-D echo showed EF of 55-60% with grade 2 diastolic dysfunction - Continue aspirin, statin, been off the nitroglycerin drip. Continue beta blocker, lisinopril, Norvasc.   Hypertensive emergency: Patient received one dose of labetalol 10 mg 1, Lasix 80 mg 1 in emergency room.  Placed on metoprolol, lisinopril, Norvasc. He is also on Lasix 40 mg daily. Patient had not taken any of his medications in the last 8 months. If however patient continues to have uncontrolled hypertension despite multiple antihypertensives, will need outpatient workup to rule out any renal artery stenosis or  hyperaldosteronism.   Bilateral leg pains: Patient complaining of worsening pains in the legs on walking, improves on resting, possibly due to neuropathy. ABIs negative for any PVD, Doppler ultrasound of the lower extremity negative for DVT. Patient was started on low-dose Neurontin  Gout: stable. -Continue allopurinol and indomethacin  Tobacco abuse and Alcohol abuse: -Patient counseled strongly on quitting smoking and alcohol, continue nicotine patch. Patient was placed on CIWA protocol  HLD: - Continue Lipitor - Lipid panel showed cholesterol 258, triglycerides 476  GERD: -Pepcid  Anxiety: Stable, no suicidal or homicidal ideations. Continue Wellbutrin  COPD: No signs of acute exacerbation. Chest x-ray is negative. -When necessary albuterol Nebs  Elevated TSH TSH 7.30 however free T4 and T3 normal. Please check TSH in 4-6 weeks.  Day of Discharge BP 141/45 mmHg  Pulse 71  Temp(Src) 97.9 F (36.6 C) (Oral)  Resp 21  Ht 6' 2.5" (1.892 m)  Wt 126.327 kg (278 lb 8 oz)  BMI 35.29 kg/m2  SpO2 96%  Physical Exam: General: Alert and awake oriented x3 not in any acute distress. HEENT: anicteric sclera, pupils reactive to light and accommodation CVS: S1-S2 clear no murmur rubs or gallops Chest: clear to auscultation bilaterally, no wheezing rales or rhonchi Abdomen: soft nontender, nondistended, normal bowel sounds Extremities: no cyanosis, clubbing or edema noted bilaterally Neuro: Cranial nerves II-XII intact, no focal neurological deficits   The results of significant diagnostics from this hospitalization (including imaging, microbiology, ancillary and laboratory) are listed below for reference.    LAB RESULTS: Basic Metabolic Panel:  Recent Labs Lab 03/12/15 0521 03/13/15 0550 03/14/15 0305  NA 138 138 138  K 4.0 3.9 3.6  CL 102 101 98*  CO2 25 26 30   GLUCOSE 112* 112* 97  BUN 6 7 5*  CREATININE 0.92 0.78 0.79  CALCIUM 9.3 9.5 9.5  MG 1.6*  --   --     Liver Function Tests: No results for input(s): AST, ALT, ALKPHOS, BILITOT, PROT, ALBUMIN in the last 168 hours. No results for input(s): LIPASE, AMYLASE in the last 168 hours. No results for input(s): AMMONIA in the last 168 hours. CBC:  Recent Labs Lab 03/11/15 1843  WBC 9.7  HGB 16.4  HCT 48.1  MCV 100.2*  PLT 252   Cardiac Enzymes:  Recent Labs Lab 03/12/15 0521 03/12/15 1050  TROPONINI 0.04* 0.05*   BNP: Invalid input(s): POCBNP CBG: No results for input(s): GLUCAP in the last 168 hours.  Significant Diagnostic Studies:  Dg Chest 2 View  03/11/2015   CLINICAL DATA:  Shortness of breath, edema  EXAM: CHEST  2 VIEW  COMPARISON:  01/13/2015  FINDINGS: Cardiomediastinal silhouette is stable. Mild hyperinflation again noted. No acute infiltrate or pleural effusion. No pulmonary edema. Stable degenerative changes thoracic spine.  IMPRESSION: No active cardiopulmonary disease.   Electronically Signed   By: Lahoma Crocker M.D.   On: 03/11/2015 19:38    2D ECHO: Study Conclusions  - Left ventricle: The cavity size was normal. Wall thickness was increased in a pattern of mild LVH. Systolic function was normal. The estimated ejection fraction was in the range of 55% to 60%. Wall motion was normal; there were no regional wall motion abnormalities. Features are consistent with a pseudonormal left ventricular filling pattern, with concomitant abnormal relaxation and increased filling pressure (grade 2 diastolic dysfunction).   Disposition and Follow-up: Discharge Instructions    Diet - low sodium heart healthy    Complete by:  As directed      Increase activity slowly    Complete by:  As directed             DISPOSITION: home   DISCHARGE FOLLOW-UP Follow-up Information    Follow up with [facility]    . Schedule an appointment as soon as possible for a visit in 10 days.   Why:  for hospital follow-up. You can also go as  walk- in Monday- Friday at 9am-10am     Contact information:   [address] [place] [address] [phone]       Time spent on Discharge: 35 minutes  Signed:   RAI,RIPUDEEP M.D. [facility] 03/14/2015, 1:45 PM Pager: [phone]

## 2015-03-14 NOTE — Care Management Note (Addendum)
Case Management Note  Patient Details  Name: Preston [name] MRN: [id number] Date of Birth: May 14, 1971  Subjective/Objective:   Pt admitted for SOB plan for d/c home today. Pt is without PCP.                 Action/Plan: CM will try to call the [facility] for hospital f/u and medication assistance. FC did speak with pt before d/c. CM did call the [facility] to set up f/u appointment and none available. CM did leave vm with Hotel manager and has not received call back as of yet. CM did call the Pharmacy at the clinic and they can still assist with medications. Pharmacy stated they will try to assist pt with hospital f/u once arrives. Pt's girlfriend states she will assist with payment of medications. Nicotine patches will be picked up at the [facility] outpatient pharmacy. No further needs from CM at this time.    Expected Discharge Date:                  Expected Discharge Plan:     In-House Referral:  NA  Discharge planning Services  CM Consult, Medication Assistance, [facility]  Post Acute Care Choice:  NA Choice offered to:  NA  DME Arranged:    DME Agency:     HH Arranged:    HH Agency:     Status of Service:  Completed, signed off  Medicare Important Message Given:    Date Medicare IM Given:    Medicare IM give by:    Date Additional Medicare IM Given:    Additional Medicare Important Message give by:     If discussed at [place] of Stay Meetings, dates discussed:    Additional Comments: Jamilla from [facility] did call and appointment is set for August 25 at 0900. Pt was notified by VM.  Bethena Roys, RN 03/14/2015, 1:46 PM

## 2015-03-24 ENCOUNTER — Ambulatory Visit: Payer: Self-pay | Attending: Internal Medicine | Admitting: Internal Medicine

## 2015-03-24 ENCOUNTER — Encounter: Payer: Self-pay | Admitting: Internal Medicine

## 2015-03-24 VITALS — BP 133/91 | HR 70 | Temp 98.2°F | Resp 18 | Wt 277.8 lb

## 2015-03-24 DIAGNOSIS — I5031 Acute diastolic (congestive) heart failure: Secondary | ICD-10-CM

## 2015-03-24 DIAGNOSIS — M1000 Idiopathic gout, unspecified site: Secondary | ICD-10-CM | POA: Insufficient documentation

## 2015-03-24 DIAGNOSIS — I5032 Chronic diastolic (congestive) heart failure: Secondary | ICD-10-CM | POA: Insufficient documentation

## 2015-03-24 DIAGNOSIS — Z720 Tobacco use: Secondary | ICD-10-CM

## 2015-03-24 DIAGNOSIS — G4733 Obstructive sleep apnea (adult) (pediatric): Secondary | ICD-10-CM

## 2015-03-24 DIAGNOSIS — F1910 Other psychoactive substance abuse, uncomplicated: Secondary | ICD-10-CM

## 2015-03-24 DIAGNOSIS — M10072 Idiopathic gout, left ankle and foot: Secondary | ICD-10-CM

## 2015-03-24 DIAGNOSIS — G894 Chronic pain syndrome: Secondary | ICD-10-CM | POA: Insufficient documentation

## 2015-03-24 DIAGNOSIS — F411 Generalized anxiety disorder: Secondary | ICD-10-CM

## 2015-03-24 DIAGNOSIS — I10 Essential (primary) hypertension: Secondary | ICD-10-CM

## 2015-03-24 MED ORDER — ALBUTEROL SULFATE HFA 108 (90 BASE) MCG/ACT IN AERS: 1.0000 | INHALATION_SPRAY | Freq: Four times a day (QID) | RESPIRATORY_TRACT | Status: DC | PRN

## 2015-03-24 MED ORDER — AMLODIPINE BESYLATE 5 MG PO TABS: 5.0000 mg | ORAL_TABLET | Freq: Every day | ORAL | Status: DC

## 2015-03-24 MED ORDER — COLCHICINE 0.6 MG PO TABS: 0.6000 mg | ORAL_TABLET | Freq: Every day | ORAL | Status: DC

## 2015-03-24 MED ORDER — INDOMETHACIN 25 MG PO CAPS: 25.0000 mg | ORAL_CAPSULE | Freq: Three times a day (TID) | ORAL | Status: DC | PRN

## 2015-03-24 MED ORDER — ACETAMINOPHEN-CODEINE #3 300-30 MG PO TABS: 1.0000 | ORAL_TABLET | ORAL | Status: DC | PRN

## 2015-03-24 MED ORDER — LISINOPRIL 40 MG PO TABS: 40.0000 mg | ORAL_TABLET | Freq: Every day | ORAL | Status: DC

## 2015-03-24 MED ORDER — ALLOPURINOL 300 MG PO TABS: 300.0000 mg | ORAL_TABLET | Freq: Every evening | ORAL | Status: DC

## 2015-03-24 MED ORDER — FUROSEMIDE 40 MG PO TABS: 40.0000 mg | ORAL_TABLET | Freq: Every day | ORAL | Status: DC

## 2015-03-24 MED ORDER — METOPROLOL TARTRATE 25 MG PO TABS: 25.0000 mg | ORAL_TABLET | Freq: Two times a day (BID) | ORAL | Status: DC

## 2015-03-24 MED ORDER — DULOXETINE HCL 30 MG PO CPEP: 30.0000 mg | ORAL_CAPSULE | Freq: Every day | ORAL | Status: DC

## 2015-03-24 MED ORDER — GABAPENTIN 300 MG PO CAPS: 300.0000 mg | ORAL_CAPSULE | Freq: Two times a day (BID) | ORAL | Status: DC

## 2015-03-24 MED ORDER — HYDROXYZINE HCL 25 MG PO TABS: 25.0000 mg | ORAL_TABLET | Freq: Three times a day (TID) | ORAL | Status: DC | PRN

## 2015-03-24 MED ORDER — ATORVASTATIN CALCIUM 40 MG PO TABS: 40.0000 mg | ORAL_TABLET | Freq: Every evening | ORAL | Status: DC

## 2015-03-24 NOTE — Progress Notes (Signed)
Patient ID: Preston [name], male   DOB: 1971/02/15, 44 y.o.   MRN: [id number]   [name], is a 44 y.o. male  [id number]  [id number]  DOB - Sep 24, 1970  Chief Complaint  Patient presents with  . Follow-up  . Congestive Heart Failure        Subjective:   Preston [name] is a 44 y.o. male here today for Hospital follow up visit. Young man with extensive medical history significant for hypertension, hyperlipidemia, GERD, gout arthritis, generalized anxiety, asthma/COPD, ongoing tobacco abuse and diverticulitis status post laparoscopic lysis of adhesions with takedown splenic flexure, laparoscopic-assisted low anterior resection with 29 mm EEA stapled anastomosis. Final pathology showed diverticulitis in December 2014. Patient was recently admitted for hypertensive emergency with shortness of breath due to acute diastolic congestive heart failure. Patient was volume overloaded as evidenced by bilateral lower leg edema and pulmonary congestion, his echocardiogram showed normal left ventricular cavity size with increased wall thickness in the pattern of mild left ventricular hypertrophy, systolic function was normal with estimated left ventricular ejection fraction of 55-60%. There was a concomitant abnormal relaxation and increased filling pressure consistent with grade 2 diastolic dysfunction. Patient was diuresed, blood pressure controlled and was discharged home to be followed up today in the clinic. He has also been advised to establish care with a cardiologist. Patient continues to smoke heavily about one half pack of cigarettes per day, he drinks alcohol although he said he is trying to reduce consumption. He does not sleep well at night because of pain in his legs associated with pains and did feeling which has greatly reduced his ability to exercise. He agrees to snoring loudly at night and he wakes up too frequently, according to his wife who was also present during today's visit,  patient barely sleep for 4 hours each night and as a result constantly tired during the day. I Saturday, leg edema has resolved, shortness of breath has resolved, no cough, only major complaint is pain and lack of good sleep. Patient has No headache, No chest pain, No abdominal pain - No Nausea. He is married lives with his wife at home. Because of his COPD, when he gets short of breath he has panic attacks and has to take Valium at night to go back to sleep. He denies any outright depression, he denies any suicidal ideation or thoughts. There has not been any flare up of his gout lately.  Problem  Essential Hypertension, Benign  Acute Diastolic Congestive Heart Failure  Morbid Obesity  Nicotine Abuse  Generalized Anxiety Disorder  Osa (Obstructive Sleep Apnea)  Primary Gout  Chronic Pain Syndrome    ALLERGIES: Allergies  Allergen Reactions  . Morphine And Related Itching, Nausea And Vomiting and Other (See Comments)    "bad feeling"    PAST MEDICAL HISTORY: Past Medical History  Diagnosis Date  . Gout   . Diverticulitis   . GERD (gastroesophageal reflux disease)   . Hypertension     Does not see a cardiologist  . Anxiety   . Panic attacks   . Asthma     reports related to anxiety  . History of Clostridium difficile infection   . COPD (chronic obstructive pulmonary disease)   . Alcohol abuse   . Hypercholesterolemia   . Pneumonia   . Chronic bronchitis   . Migraine     "new onset" (03/11/2015)  . Arthritis     "hips, knees, back" (03/11/2015)  . Chronic lower back pain   .  Chronic hip pain     "both"    MEDICATIONS AT HOME: Prior to Admission medications   Medication Sig Start Date End Date Taking? Authorizing Provider  colchicine 0.6 MG tablet Take 1 tablet (0.6 mg total) by mouth daily. 03/24/15  Yes Tresa Garter, MD  albuterol (PROVENTIL HFA;VENTOLIN HFA) 108 (90 BASE) MCG/ACT inhaler Inhale 1-2 puffs into the lungs every 6 (six) hours as needed for wheezing  or shortness of breath. 03/24/15   Tresa Garter, MD  allopurinol (ZYLOPRIM) 300 MG tablet Take 1 tablet (300 mg total) by mouth every evening. 03/24/15   Tresa Garter, MD  amLODipine (NORVASC) 5 MG tablet Take 1 tablet (5 mg total) by mouth daily. 03/24/15   Tresa Garter, MD  aspirin 325 MG tablet Take 1 tablet (325 mg total) by mouth daily. 03/14/15   Ripudeep Krystal Eaton, MD  atorvastatin (LIPITOR) 40 MG tablet Take 1 tablet (40 mg total) by mouth every evening. Please substitute with any cheaper generic option 03/24/15   Tresa Garter, MD  diazepam (VALIUM) 5 MG tablet Take 5 mg by mouth daily as needed for anxiety.    Historical Provider, MD  DULoxetine (CYMBALTA) 30 MG capsule Take 1 capsule (30 mg total) by mouth daily. 03/24/15   Tresa Garter, MD  famotidine (PEPCID) 20 MG tablet Take 1 tablet (20 mg total) by mouth daily. 03/14/15   Ripudeep Krystal Eaton, MD  furosemide (LASIX) 40 MG tablet Take 1 tablet (40 mg total) by mouth daily. 03/24/15   Tresa Garter, MD  gabapentin (NEURONTIN) 300 MG capsule Take 1 capsule (300 mg total) by mouth 2 (two) times daily. 03/24/15   Tresa Garter, MD  HYDROcodone-acetaminophen (NORCO/VICODIN) 5-325 MG per tablet Take 1-2 tablets by mouth every 6 (six) hours as needed for moderate pain or severe pain. 03/14/15   Ripudeep Krystal Eaton, MD  hydrOXYzine (ATARAX/VISTARIL) 25 MG tablet Take 1 tablet (25 mg total) by mouth 3 (three) times daily as needed. 03/24/15   Tresa Garter, MD  indomethacin (INDOCIN) 25 MG capsule Take 1 capsule (25 mg total) by mouth 3 (three) times daily as needed. For gout flare up. 03/24/15   Tresa Garter, MD  lisinopril (PRINIVIL,ZESTRIL) 40 MG tablet Take 1 tablet (40 mg total) by mouth daily. 03/24/15   Tresa Garter, MD  metoprolol tartrate (LOPRESSOR) 25 MG tablet Take 1 tablet (25 mg total) by mouth 2 (two) times daily. 03/24/15   Tresa Garter, MD  nicotine (NICODERM CQ - DOSED IN MG/24  HOURS) 21 mg/24hr patch Place 1 patch (21 mg total) onto the skin daily. 03/14/15   Ripudeep Krystal Eaton, MD     Objective:   Filed Vitals:   03/24/15 0942  BP: 133/91  Pulse: 70  Temp: 98.2 F (36.8 C)  TempSrc: Oral  Resp: 18  Weight: 277 lb 12.8 oz (126.009 kg)  SpO2: 98%    Exam General appearance : Awake, alert, not in any distress. Speech Clear. Not toxic looking, morbidly obese, very poor dentition HEENT: Atraumatic and Normocephalic, pupils equally reactive to light and accomodation Neck: supple, no JVD. No cervical lymphadenopathy.  Chest:Good air entry bilaterally, no added sounds  CVS: S1 S2 regular, no murmurs.  Abdomen: Bowel sounds present, Non tender and not distended with no gaurding, rigidity or rebound. Extremities: B/L Lower Ext shows no edema, both legs are warm to touch Neurology: Awake alert, and oriented X 3, CN II-XII intact, Non  focal Skin:No Rash  Data Review Lab Results  Component Value Date   HGBA1C 5.6 03/12/2015   HGBA1C 5.6 08/05/2013     Assessment & Plan   1. Essential hypertension, benign  - lisinopril (PRINIVIL,ZESTRIL) 40 MG tablet; Take 1 tablet (40 mg total) by mouth daily.  Dispense: 90 tablet; Refill: 3 - metoprolol tartrate (LOPRESSOR) 25 MG tablet; Take 1 tablet (25 mg total) by mouth 2 (two) times daily.  Dispense: 180 tablet; Refill: 3 - amLODipine (NORVASC) 5 MG tablet; Take 1 tablet (5 mg total) by mouth daily.  Dispense: 90 tablet; Refill: 3  We have discussed target BP range and blood pressure goal. I have advised patient to check BP regularly and to call us back or report to clinic if the numbers are consistently higher than 140/90. We discussed the importance of compliance with medical therapy and DASH diet recommended, consequences of uncontrolled hypertension discussed.  - continue current BP medications  2. Acute diastolic congestive heart failure  - furosemide (LASIX) 40 MG tablet; Take 1 tablet (40 mg total) by mouth  daily.  Dispense: 90 tablet; Refill: 3 - Refer to [facility] (Non-Urgent)  3. Generalized anxiety disorder  - DULoxetine (CYMBALTA) 30 MG capsule; Take 1 capsule (30 mg total) by mouth daily.  Dispense: 30 capsule; Refill: 3 - hydrOXYzine (ATARAX/VISTARIL) 25 MG tablet; Take 1 tablet (25 mg total) by mouth 3 (three) times daily as needed.  Dispense: 60 tablet; Refill: 3  4. Morbid obesity  - atorvastatin (LIPITOR) 40 MG tablet; Take 1 tablet (40 mg total) by mouth every evening. Please substitute with any cheaper generic option  Dispense: 30 tablet; Refill: 5  5. Nicotine abuse  Anton was counseled on the dangers of tobacco use, and was advised to quit. Reviewed strategies to maximize success, including removing cigarettes and smoking materials from environment, stress management and support of family/friends.   6. OSA (obstructive sleep apnea)  - Split night study; Future  - albuterol (PROVENTIL HFA;VENTOLIN HFA) 108 (90 BASE) MCG/ACT inhaler; Inhale 1-2 puffs into the lungs every 6 (six) hours as needed for wheezing or shortness of breath.  Dispense: 1 Inhaler; Refill: 3  7. Idiopathic gout of left ankle, unspecified chronicity Refill - allopurinol (ZYLOPRIM) 300 MG tablet; Take 1 tablet (300 mg total) by mouth every evening.  Dispense: 90 tablet; Refill: 3 - indomethacin (INDOCIN) 25 MG capsule; Take 1 capsule (25 mg total) by mouth 3 (three) times daily as needed. For gout flare up.  Dispense: 60 capsule; Refill: 0 - colchicine 0.6 MG tablet; Take 1 tablet (0.6 mg total) by mouth daily.  Dispense: 30 tablet; Refill: 3  8. Chronic pain syndrome Will start patient on Cymbalta for dual role of anxiety/depression and pain management - DULoxetine (CYMBALTA) 30 MG capsule; Take 1 capsule (30 mg total) by mouth daily.  Dispense: 30 capsule; Refill: 3 Continue - gabapentin (NEURONTIN) 300 MG capsule; Take 1 capsule (300 mg total) by mouth 2 (two) times daily.   Dispense: 180 capsule; Refill: 3   Patient have been counseled extensively about nutrition and exercise  Return in about 3 months (around 06/24/2015) for Heart Failure and Hypertension, Follow up Pain and comorbidities, Generalized Anxiety Disorder.  The patient was given clear instructions to go to ER or return to medical center if symptoms don't improve, worsen or new problems develop. The patient verbalized understanding. The patient was told to call to get lab results if they haven't heard anything in the next  week.   This note has been created with Surveyor, quantity. Any transcriptional errors are unintentional.    Angelica Chessman, MD, [place], Karilyn Cota, [place] [facility] [place], [place]   03/24/2015, 10:44 AM

## 2015-03-24 NOTE — Patient Instructions (Signed)
DASH Eating Plan DASH stands for "Dietary Approaches to Stop Hypertension." The DASH eating plan is a healthy eating plan that has been shown to reduce high blood pressure (hypertension). Additional health benefits may include reducing the risk of type 2 diabetes mellitus, heart disease, and stroke. The DASH eating plan may also help with weight loss. WHAT DO I NEED TO KNOW ABOUT THE DASH EATING PLAN? For the DASH eating plan, you will follow these general guidelines:  Choose foods with a percent daily value for sodium of less than 5% (as listed on the food label).  Use salt-free seasonings or herbs instead of table salt or sea salt.  Check with your health care provider or pharmacist before using salt substitutes.  Eat lower-sodium products, often labeled as "lower sodium" or "no salt added."  Eat fresh foods.  Eat more vegetables, fruits, and low-fat dairy products.  Choose whole grains. Look for the word "whole" as the first word in the ingredient list.  Choose fish and skinless chicken or turkey more often than red meat. Limit fish, poultry, and meat to 6 oz (170 g) each day.  Limit sweets, desserts, sugars, and sugary drinks.  Choose heart-healthy fats.  Limit cheese to 1 oz (28 g) per day.  Eat more home-cooked food and less restaurant, buffet, and fast food.  Limit fried foods.  Cook foods using methods other than frying.  Limit canned vegetables. If you do use them, rinse them well to decrease the sodium.  When eating at a restaurant, ask that your food be prepared with less salt, or no salt if possible. WHAT FOODS CAN I EAT? Seek help from a dietitian for individual calorie needs. Grains Whole grain or whole wheat bread. Brown rice. Whole grain or whole wheat pasta. Quinoa, bulgur, and whole grain cereals. Low-sodium cereals. Corn or whole wheat flour tortillas. Whole grain cornbread. Whole grain crackers. Low-sodium crackers. Vegetables Fresh or frozen vegetables  (raw, steamed, roasted, or grilled). Low-sodium or reduced-sodium tomato and vegetable juices. Low-sodium or reduced-sodium tomato sauce and paste. Low-sodium or reduced-sodium canned vegetables.  Fruits All fresh, canned (in natural juice), or frozen fruits. Meat and Other Protein Products Ground beef (85% or leaner), grass-fed beef, or beef trimmed of fat. Skinless chicken or turkey. Ground chicken or turkey. Pork trimmed of fat. All fish and seafood. Eggs. Dried beans, peas, or lentils. Unsalted nuts and seeds. Unsalted canned beans. Dairy Low-fat dairy products, such as skim or 1% milk, 2% or reduced-fat cheeses, low-fat ricotta or cottage cheese, or plain low-fat yogurt. Low-sodium or reduced-sodium cheeses. Fats and Oils Tub margarines without trans fats. Light or reduced-fat mayonnaise and salad dressings (reduced sodium). Avocado. Safflower, olive, or canola oils. Natural peanut or almond butter. Other Unsalted popcorn and pretzels. The items listed above may not be a complete list of recommended foods or beverages. Contact your dietitian for more options. WHAT FOODS ARE NOT RECOMMENDED? Grains White bread. White pasta. White rice. Refined cornbread. Bagels and croissants. Crackers that contain trans fat. Vegetables Creamed or fried vegetables. Vegetables in a cheese sauce. Regular canned vegetables. Regular canned tomato sauce and paste. Regular tomato and vegetable juices. Fruits Dried fruits. Canned fruit in light or heavy syrup. Fruit juice. Meat and Other Protein Products Fatty cuts of meat. Ribs, chicken wings, bacon, sausage, bologna, salami, chitterlings, fatback, hot dogs, bratwurst, and packaged luncheon meats. Salted nuts and seeds. Canned beans with salt. Dairy Whole or 2% milk, cream, half-and-half, and cream cheese. Whole-fat or sweetened yogurt. Full-fat   cheeses or blue cheese. Nondairy creamers and whipped toppings. Processed cheese, cheese spreads, or cheese  curds. Condiments Onion and garlic salt, seasoned salt, table salt, and sea salt. Canned and packaged gravies. Worcestershire sauce. Tartar sauce. Barbecue sauce. Teriyaki sauce. Soy sauce, including reduced sodium. Steak sauce. Fish sauce. Oyster sauce. Cocktail sauce. Horseradish. Ketchup and mustard. Meat flavorings and tenderizers. Bouillon cubes. Hot sauce. Tabasco sauce. Marinades. Taco seasonings. Relishes. Fats and Oils Butter, stick margarine, lard, shortening, ghee, and bacon fat. Coconut, palm kernel, or palm oils. Regular salad dressings. Other Pickles and olives. Salted popcorn and pretzels. The items listed above may not be a complete list of foods and beverages to avoid. Contact your dietitian for more information. WHERE CAN I FIND MORE INFORMATION? National Heart, Lung, and [employer] Institute: www.nhlbi.nih.gov/health/health-topics/topics/dash/ Document Released: 07/05/2011 Document Revised: 11/30/2013 Document Reviewed: 05/20/2013 ExitCare Patient Information 2015 [employer]. This information is not intended to replace advice given to you by your health care provider. Make sure you discuss any questions you have with your health care provider. Hypertension Hypertension, commonly called high blood pressure, is when the force of blood pumping through your arteries is too strong. Your arteries are the blood vessels that carry blood from your heart throughout your body. A blood pressure reading consists of a higher number over a lower number, such as 110/72. The higher number (systolic) is the pressure inside your arteries when your heart pumps. The lower number (diastolic) is the pressure inside your arteries when your heart relaxes. Ideally you want your blood pressure below 120/80. Hypertension forces your heart to work harder to pump blood. Your arteries may become narrow or stiff. Having hypertension puts you at risk for heart disease, stroke, and other problems.  RISK  FACTORS Some risk factors for high blood pressure are controllable. Others are not.  Risk factors you cannot control include:   Race. You may be at higher risk if you are African American.  Age. Risk increases with age.  Gender. Men are at higher risk than women before age 45 years. After age 65, women are at higher risk than men. Risk factors you can control include:  Not getting enough exercise or physical activity.  Being overweight.  Getting too much fat, sugar, calories, or salt in your diet.  Drinking too much alcohol. SIGNS AND SYMPTOMS Hypertension does not usually cause signs or symptoms. Extremely high blood pressure (hypertensive crisis) may cause headache, anxiety, shortness of breath, and nosebleed. DIAGNOSIS  To check if you have hypertension, your health care provider will measure your blood pressure while you are seated, with your arm held at the level of your heart. It should be measured at least twice using the same arm. Certain conditions can cause a difference in blood pressure between your right and left arms. A blood pressure reading that is higher than normal on one occasion does not mean that you need treatment. If one blood pressure reading is high, ask your health care provider about having it checked again. TREATMENT  Treating high blood pressure includes making lifestyle changes and possibly taking medicine. Living a healthy lifestyle can help lower high blood pressure. You may need to change some of your habits. Lifestyle changes may include:  Following the DASH diet. This diet is high in fruits, vegetables, and whole grains. It is low in salt, red meat, and added sugars.  Getting at least 2 hours of brisk physical activity every week.  Losing weight if necessary.  Not smoking.  Limiting   alcoholic beverages.  Learning ways to reduce stress. If lifestyle changes are not enough to get your blood pressure under control, your health care provider may  prescribe medicine. You may need to take more than one. Work closely with your health care provider to understand the risks and benefits. HOME CARE INSTRUCTIONS  Have your blood pressure rechecked as directed by your health care provider.   Take medicines only as directed by your health care provider. Follow the directions carefully. Blood pressure medicines must be taken as prescribed. The medicine does not work as well when you skip doses. Skipping doses also puts you at risk for problems.   Do not smoke.   Monitor your blood pressure at home as directed by your health care provider. SEEK MEDICAL CARE IF:   You think you are having a reaction to medicines taken.  You have recurrent headaches or feel dizzy.  You have swelling in your ankles.  You have trouble with your vision. SEEK IMMEDIATE MEDICAL CARE IF:  You develop a severe headache or confusion.  You have unusual weakness, numbness, or feel faint.  You have severe chest or abdominal pain.  You vomit repeatedly.  You have trouble breathing. MAKE SURE YOU:   Understand these instructions.  Will watch your condition.  Will get help right away if you are not doing well or get worse. Document Released: 07/16/2005 Document Revised: 11/30/2013 Document Reviewed: 05/08/2013 ExitCare Patient Information 2015 [employer]. This information is not intended to replace advice given to you by your health care provider. Make sure you discuss any questions you have with your health care provider.  

## 2015-03-24 NOTE — Progress Notes (Signed)
Here for hospital follow up Would like referral to cardio Would like to up dose on gabapentin due to night toe pain radiating to ankle  Patient would like provider to look at hospital labs

## 2015-03-30 ENCOUNTER — Ambulatory Visit: Payer: Medicaid Other | Attending: Internal Medicine

## 2015-04-05 ENCOUNTER — Ambulatory Visit (HOSPITAL_BASED_OUTPATIENT_CLINIC_OR_DEPARTMENT_OTHER): Payer: Medicaid Other | Attending: Internal Medicine | Admitting: Radiology

## 2015-04-05 DIAGNOSIS — G4733 Obstructive sleep apnea (adult) (pediatric): Secondary | ICD-10-CM | POA: Insufficient documentation

## 2015-04-05 DIAGNOSIS — E669 Obesity, unspecified: Secondary | ICD-10-CM | POA: Diagnosis not present

## 2015-04-05 DIAGNOSIS — R0683 Snoring: Secondary | ICD-10-CM | POA: Diagnosis not present

## 2015-04-05 DIAGNOSIS — Z6838 Body mass index (BMI) 38.0-38.9, adult: Secondary | ICD-10-CM | POA: Insufficient documentation

## 2015-04-05 DIAGNOSIS — I10 Essential (primary) hypertension: Secondary | ICD-10-CM | POA: Insufficient documentation

## 2015-04-05 DIAGNOSIS — R5383 Other fatigue: Secondary | ICD-10-CM | POA: Insufficient documentation

## 2015-04-05 DIAGNOSIS — I493 Ventricular premature depolarization: Secondary | ICD-10-CM | POA: Insufficient documentation

## 2015-04-05 DIAGNOSIS — I509 Heart failure, unspecified: Secondary | ICD-10-CM | POA: Insufficient documentation

## 2015-04-06 ENCOUNTER — Encounter: Payer: Self-pay | Admitting: Cardiology

## 2015-04-06 ENCOUNTER — Ambulatory Visit: Payer: Medicaid Other | Attending: Cardiology | Admitting: Cardiology

## 2015-04-06 VITALS — BP 120/81 | HR 69 | Temp 98.6°F | Resp 20 | Ht 75.0 in | Wt 273.0 lb

## 2015-04-06 DIAGNOSIS — Z6834 Body mass index (BMI) 34.0-34.9, adult: Secondary | ICD-10-CM | POA: Insufficient documentation

## 2015-04-06 DIAGNOSIS — I5033 Acute on chronic diastolic (congestive) heart failure: Secondary | ICD-10-CM | POA: Insufficient documentation

## 2015-04-06 DIAGNOSIS — I5031 Acute diastolic (congestive) heart failure: Secondary | ICD-10-CM

## 2015-04-06 DIAGNOSIS — I10 Essential (primary) hypertension: Secondary | ICD-10-CM | POA: Diagnosis not present

## 2015-04-06 DIAGNOSIS — Z720 Tobacco use: Secondary | ICD-10-CM | POA: Diagnosis not present

## 2015-04-06 DIAGNOSIS — I5032 Chronic diastolic (congestive) heart failure: Secondary | ICD-10-CM

## 2015-04-06 MED ORDER — LISINOPRIL 40 MG PO TABS: 40.0000 mg | ORAL_TABLET | Freq: Every day | ORAL | Status: DC

## 2015-04-06 MED ORDER — METOPROLOL TARTRATE 25 MG PO TABS: 25.0000 mg | ORAL_TABLET | Freq: Two times a day (BID) | ORAL | Status: DC

## 2015-04-06 MED ORDER — AMLODIPINE BESYLATE 5 MG PO TABS: 5.0000 mg | ORAL_TABLET | Freq: Every day | ORAL | Status: DC

## 2015-04-06 MED ORDER — ASPIRIN 325 MG PO TABS: 325.0000 mg | ORAL_TABLET | Freq: Every day | ORAL | Status: DC

## 2015-04-06 MED ORDER — FUROSEMIDE 40 MG PO TABS: 40.0000 mg | ORAL_TABLET | Freq: Every day | ORAL | Status: DC

## 2015-04-06 MED ORDER — ATORVASTATIN CALCIUM 40 MG PO TABS: 40.0000 mg | ORAL_TABLET | Freq: Every evening | ORAL | Status: DC

## 2015-04-06 NOTE — Patient Instructions (Signed)
Thank you for visiting with Dr. Verl Blalock today. Follow-up with Dr. Verl Blalock in 3 months.

## 2015-04-06 NOTE — Progress Notes (Signed)
HPI Preston [name] is a 44 year old married white male who returns to clinic today after being hospitalized for acute diastolic heart failure and accelerated hypertension. Since being on his medications, he has markedly improved with a normal blood pressure today. Denies orthopnea, PND and his edema has resolved.  He's cut back on smoking but still smokes a half pack a day. He's also been a heavy drinker.  Please see discharge summary for details.  Past Medical History  Diagnosis Date  . Gout   . Diverticulitis   . GERD (gastroesophageal reflux disease)   . Hypertension     Does not see a cardiologist  . Anxiety   . Panic attacks   . Asthma     reports related to anxiety  . History of Clostridium difficile infection   . COPD (chronic obstructive pulmonary disease)   . Alcohol abuse   . Hypercholesterolemia   . Pneumonia   . Chronic bronchitis   . Migraine     "new onset" (03/11/2015)  . Arthritis     "hips, knees, back" (03/11/2015)  . Chronic lower back pain   . Chronic hip pain     "both"    Current Outpatient Prescriptions  Medication Sig Dispense Refill  . acetaminophen-codeine (TYLENOL #3) 300-30 MG per tablet Take 1 tablet by mouth every 4 (four) hours as needed for moderate pain. 60 tablet 0  . albuterol (PROVENTIL HFA;VENTOLIN HFA) 108 (90 BASE) MCG/ACT inhaler Inhale 1-2 puffs into the lungs every 6 (six) hours as needed for wheezing or shortness of breath. 1 Inhaler 3  . allopurinol (ZYLOPRIM) 300 MG tablet Take 1 tablet (300 mg total) by mouth every evening. 90 tablet 3  . amLODipine (NORVASC) 5 MG tablet Take 1 tablet (5 mg total) by mouth daily. 90 tablet 3  . aspirin 325 MG tablet Take 1 tablet (325 mg total) by mouth daily. 30 tablet 3  . atorvastatin (LIPITOR) 40 MG tablet Take 1 tablet (40 mg total) by mouth every evening. Please substitute with any cheaper generic option 30 tablet 5  . colchicine 0.6 MG tablet Take 1 tablet (0.6 mg total) by mouth daily.  30 tablet 3  . diazepam (VALIUM) 5 MG tablet Take 5 mg by mouth daily as needed for anxiety.    . DULoxetine (CYMBALTA) 30 MG capsule Take 1 capsule (30 mg total) by mouth daily. 30 capsule 3  . famotidine (PEPCID) 20 MG tablet Take 1 tablet (20 mg total) by mouth daily. 30 tablet 3  . furosemide (LASIX) 40 MG tablet Take 1 tablet (40 mg total) by mouth daily. 90 tablet 3  . gabapentin (NEURONTIN) 300 MG capsule Take 1 capsule (300 mg total) by mouth 2 (two) times daily. 180 capsule 3  . HYDROcodone-acetaminophen (NORCO/VICODIN) 5-325 MG per tablet Take 1-2 tablets by mouth every 6 (six) hours as needed for moderate pain or severe pain. 20 tablet 0  . hydrOXYzine (ATARAX/VISTARIL) 25 MG tablet Take 1 tablet (25 mg total) by mouth 3 (three) times daily as needed. 60 tablet 3  . indomethacin (INDOCIN) 25 MG capsule Take 1 capsule (25 mg total) by mouth 3 (three) times daily as needed. For gout flare up. 60 capsule 0  . lisinopril (PRINIVIL,ZESTRIL) 40 MG tablet Take 1 tablet (40 mg total) by mouth daily. 90 tablet 3  . metoprolol tartrate (LOPRESSOR) 25 MG tablet Take 1 tablet (25 mg total) by mouth 2 (two) times daily. 180 tablet 3  . nicotine (NICODERM CQ -  DOSED IN MG/24 HOURS) 21 mg/24hr patch Place 1 patch (21 mg total) onto the skin daily. (Patient not taking: Reported on 04/06/2015) 28 patch 2   No current facility-administered medications for this visit.    Allergies  Allergen Reactions  . Morphine And Related Itching, Nausea And Vomiting and Other (See Comments)    "bad feeling"    Family History  Problem Relation Age of Onset  . Cancer Father     lymphoma    Social History   Social History  . Marital Status: Divorced    Spouse Name: N/A  . Number of Children: N/A  . Years of Education: N/A   Occupational History  . Not on file.   Social History Main Topics  . Smoking status: Current Every Day Smoker -- 1.00 packs/day for 30 years    Types: Cigarettes  . Smokeless  tobacco: Former Systems developer    Types: Chew     Comment: "quit chewing in the 1990's"  . Alcohol Use: Yes     Comment: 3/4 beers a week  . Drug Use: No  . Sexual Activity: Yes   Other Topics Concern  . Not on file   Social History Narrative    ROS ALL NEGATIVE EXCEPT THOSE NOTED IN HPI  PE  General Appearance: well developed, well nourished in no acute distress, obese, muscular HEENT: symmetrical face, PERRLA, poor dentition Neck: no JVD, thyromegaly, or adenopathy, trachea midline Chest: symmetric without deformity Cardiac: PMI non-displaced, RRR, normal S1, S2, no gallop or murmur Lung: clear to ausculation and percussion Vascular: all pulses full without bruits  Abdominal: nondistended, nontender,  Extremities: no cyanosis, clubbing or edema, no sign of DVT, no varicosities  Skin: normal color, no rashes Neuro: alert and oriented x 3, non-focal Pysch: normal affect  EKG  BMET    Component Value Date/Time   NA 138 03/14/2015 0305   K 3.6 03/14/2015 0305   CL 98* 03/14/2015 0305   CO2 30 03/14/2015 0305   GLUCOSE 97 03/14/2015 0305   BUN 5* 03/14/2015 0305   CREATININE 0.79 03/14/2015 0305   CREATININE 0.67 08/05/2013 1053   CALCIUM 9.5 03/14/2015 0305   GFRNONAA >60 03/14/2015 0305   GFRNONAA >89 08/05/2013 1053   GFRAA >60 03/14/2015 0305   GFRAA >89 08/05/2013 1053    Lipid Panel     Component Value Date/Time   CHOL 258* 03/12/2015 0521   TRIG 476* 03/12/2015 0521   HDL 40* 03/12/2015 0521   CHOLHDL 6.5 03/12/2015 0521   VLDL UNABLE TO CALCULATE IF TRIGLYCERIDE OVER 400 mg/dL 03/12/2015 0521   LDLCALC UNABLE TO CALCULATE IF TRIGLYCERIDE OVER 400 mg/dL 03/12/2015 0521    CBC    Component Value Date/Time   WBC 9.7 03/11/2015 1843   RBC 4.80 03/11/2015 1843   HGB 16.4 03/11/2015 1843   HCT 48.1 03/11/2015 1843   PLT 252 03/11/2015 1843   MCV 100.2* 03/11/2015 1843   MCH 34.2* 03/11/2015 1843   MCHC 34.1 03/11/2015 1843   RDW 13.7 03/11/2015 1843    LYMPHSABS 2.2 08/05/2013 1053   MONOABS 0.7 08/05/2013 1053   EOSABS 0.3 08/05/2013 1053   BASOSABS 0.0 08/05/2013 1053

## 2015-04-06 NOTE — Progress Notes (Signed)
Patient referred by Dr. Doreene Burke for diastolic CHF. Patient denies any pain today. Patient denies swelling today, patient had swelling three weeks. Patient wheezes all the time. Patient has had no wheezing since using fluid medications.

## 2015-04-06 NOTE — Assessment & Plan Note (Signed)
He has mild left ventricular hypertrophy on his echocardiogram. I have reassured he and his wife that this will resolve and not become a clinical problem if he will take his meds and lose weight. Also encouraged him not to smoke and to quit drinking. I'll see him back again in 3 months. All meds refilled.

## 2015-04-09 ENCOUNTER — Encounter (HOSPITAL_BASED_OUTPATIENT_CLINIC_OR_DEPARTMENT_OTHER): Payer: Self-pay | Admitting: Internal Medicine

## 2015-04-09 DIAGNOSIS — G4733 Obstructive sleep apnea (adult) (pediatric): Secondary | ICD-10-CM

## 2015-04-09 NOTE — Progress Notes (Signed)
  Patient Name: Preston [name], Preston [name] Date: 04/05/2015 Gender: Male D.O.B: 24-Dec-1970 Age (years): 72 Referring Provider: Angelica Chessman Height (inches): 69 Interpreting Physician: Baird Lyons MD, ABSM Weight (lbs): 285 RPSGT: Laren Everts BMI: 38 MRN: [id number] Neck Size: 18.00 CLINICAL INFORMATION Sleep Study Type: NPSG  Indication for sleep study: Congestive Heart Failure, Fatigue, Hypertension, Obesity, OSA, Snoring, Witnessed Apneas  Epworth Sleepiness Score: 1  SLEEP STUDY TECHNIQUE As per the AASM Manual for the Scoring of Sleep and Associated Events v2.3 (April 2016) with a hypopnea requiring 4% desaturations.  The channels recorded and monitored were frontal, central and occipital EEG, electrooculogram (EOG), submentalis EMG (chin), nasal and oral airflow, thoracic and abdominal wall motion, anterior tibialis EMG, snore microphone, electrocardiogram, and pulse oximetry.  MEDICATIONS Patient's medications include: charted for review. Medications self-administered by patient during sleep study : No sleep medicine administered.  SLEEP ARCHITECTURE The study was initiated at 10:11:04 PM and ended at 5:02:26 AM.  Sleep onset time was 66.6 minutes and the sleep efficiency was 65.4%. The total sleep time was 269.0 minutes.  Stage REM latency was N/A minutes.  The patient spent 18.77% of the night in stage N1 sleep, 81.23% in stage N2 sleep, 0.00% in stage N3 and 0.00% in REM.  Alpha intrusion was absent.  Supine sleep was 3.39%.  RESPIRATORY PARAMETERS The overall apnea/hypopnea index (AHI) was 2.7 per hour. There were 2 total apneas, including 0 obstructive, 0 central and 2 mixed apneas. There were 10 hypopneas and 45 RERAs.  The AHI during Stage REM sleep was N/A per hour.  AHI while supine was 6.6 per hour.  The mean oxygen saturation was 92.67%. The minimum SpO2 during sleep was 88.00%.  Moderate snoring was noted during this study.  Awake after  sleep onset: 75.8 minutes  CARDIAC DATA The 2 lead EKG demonstrated sinus rhythm. The mean heart rate was 72.60 beats per minute. Other EKG findings include: PVCs.  LEG MOVEMENT DATA The total PLMS were 35 with a resulting PLMS index of 7.81. Associated arousal with leg movement index was 0.0 .  IMPRESSIONS No significant obstructive sleep apnea occurred during this study (AHI = 2.7/h). No significant central sleep apnea occurred during this study (CAI = 0.0/h). Mild oxygen desaturation was noted during this study (Min O2 = 88.00%). The patient snored with Moderate snoring volume. EKG findings include PVCs. Mild periodic limb movements of sleep occurred during the study. No significant associated arousals.   DIAGNOSIS Primary Snoring (786.09 [R06.83 ICD-10]) Normal study  RECOMMENDATIONS Avoid alcohol, sedatives and other CNS depressants that may worsen sleep apnea and disrupt normal sleep architecture. Sleep hygiene should be reviewed to assess factors that may improve sleep quality. Weight management and regular exercise should be initiated or continued if appropriate.  Deneise Lever Diplomate, American Board of Sleep Medicine  ELECTRONICALLY SIGNED ON:  04/09/2015, 10:58 AM [place] PH: [phone]   FX: [phone] [place]

## 2015-07-06 ENCOUNTER — Ambulatory Visit: Payer: Medicaid Other | Admitting: Cardiology

## 2015-07-14 ENCOUNTER — Other Ambulatory Visit: Payer: Self-pay | Admitting: Internal Medicine

## 2015-07-14 ENCOUNTER — Ambulatory Visit: Payer: Medicaid Other | Attending: Internal Medicine | Admitting: Internal Medicine

## 2015-07-14 ENCOUNTER — Encounter: Payer: Self-pay | Admitting: Internal Medicine

## 2015-07-14 VITALS — BP 119/81 | HR 80 | Temp 98.3°F | Resp 18 | Ht 74.0 in | Wt 277.4 lb

## 2015-07-14 DIAGNOSIS — F17210 Nicotine dependence, cigarettes, uncomplicated: Secondary | ICD-10-CM | POA: Diagnosis not present

## 2015-07-14 DIAGNOSIS — I10 Essential (primary) hypertension: Secondary | ICD-10-CM | POA: Diagnosis not present

## 2015-07-14 DIAGNOSIS — M25551 Pain in right hip: Secondary | ICD-10-CM | POA: Diagnosis not present

## 2015-07-14 DIAGNOSIS — J449 Chronic obstructive pulmonary disease, unspecified: Secondary | ICD-10-CM | POA: Insufficient documentation

## 2015-07-14 DIAGNOSIS — F1010 Alcohol abuse, uncomplicated: Secondary | ICD-10-CM | POA: Diagnosis not present

## 2015-07-14 DIAGNOSIS — G894 Chronic pain syndrome: Secondary | ICD-10-CM | POA: Insufficient documentation

## 2015-07-14 DIAGNOSIS — G8929 Other chronic pain: Secondary | ICD-10-CM | POA: Diagnosis not present

## 2015-07-14 DIAGNOSIS — I5032 Chronic diastolic (congestive) heart failure: Secondary | ICD-10-CM | POA: Insufficient documentation

## 2015-07-14 DIAGNOSIS — E7800 Pure hypercholesterolemia, unspecified: Secondary | ICD-10-CM | POA: Insufficient documentation

## 2015-07-14 DIAGNOSIS — K219 Gastro-esophageal reflux disease without esophagitis: Secondary | ICD-10-CM | POA: Diagnosis not present

## 2015-07-14 DIAGNOSIS — F411 Generalized anxiety disorder: Secondary | ICD-10-CM | POA: Diagnosis present

## 2015-07-14 DIAGNOSIS — J45909 Unspecified asthma, uncomplicated: Secondary | ICD-10-CM | POA: Insufficient documentation

## 2015-07-14 DIAGNOSIS — Z885 Allergy status to narcotic agent status: Secondary | ICD-10-CM | POA: Insufficient documentation

## 2015-07-14 DIAGNOSIS — M25559 Pain in unspecified hip: Secondary | ICD-10-CM

## 2015-07-14 DIAGNOSIS — M109 Gout, unspecified: Secondary | ICD-10-CM | POA: Diagnosis not present

## 2015-07-14 DIAGNOSIS — M79672 Pain in left foot: Secondary | ICD-10-CM | POA: Insufficient documentation

## 2015-07-14 DIAGNOSIS — Z7982 Long term (current) use of aspirin: Secondary | ICD-10-CM | POA: Diagnosis not present

## 2015-07-14 MED ORDER — FAMOTIDINE 20 MG PO TABS: 20.0000 mg | ORAL_TABLET | Freq: Every day | ORAL | Status: DC

## 2015-07-14 MED ORDER — ACETAMINOPHEN-CODEINE #3 300-30 MG PO TABS: 1.0000 | ORAL_TABLET | ORAL | Status: DC | PRN

## 2015-07-14 MED ORDER — GABAPENTIN 300 MG PO CAPS: 300.0000 mg | ORAL_CAPSULE | Freq: Three times a day (TID) | ORAL | Status: DC

## 2015-07-14 MED ORDER — DULOXETINE HCL 30 MG PO CPEP: 30.0000 mg | ORAL_CAPSULE | Freq: Every day | ORAL | Status: DC

## 2015-07-14 NOTE — Progress Notes (Signed)
Patient here for 3 month FU.   Patient complains of bilateral foot pain scaled at a 4 a  this time. Patient complains of not being able to sleep due to the pain becoming severe at night.  Patient requesting refill on Tylenol 3, Pepcid and possible increase on Gabapentin.  Patient does not want his flu shot today

## 2015-07-14 NOTE — Patient Instructions (Signed)
DASH Eating Plan DASH stands for "Dietary Approaches to Stop Hypertension." The DASH eating plan is a healthy eating plan that has been shown to reduce high blood pressure (hypertension). Additional health benefits may include reducing the risk of type 2 diabetes mellitus, heart disease, and stroke. The DASH eating plan may also help with weight loss. WHAT DO I NEED TO KNOW ABOUT THE DASH EATING PLAN? For the DASH eating plan, you will follow these general guidelines:  Choose foods with a percent daily value for sodium of less than 5% (as listed on the food label).  Use salt-free seasonings or herbs instead of table salt or sea salt.  Check with your health care provider or pharmacist before using salt substitutes.  Eat lower-sodium products, often labeled as "lower sodium" or "no salt added."  Eat fresh foods.  Eat more vegetables, fruits, and low-fat dairy products.  Choose whole grains. Look for the word "whole" as the first word in the ingredient list.  Choose fish and skinless chicken or turkey more often than red meat. Limit fish, poultry, and meat to 6 oz (170 g) each day.  Limit sweets, desserts, sugars, and sugary drinks.  Choose heart-healthy fats.  Limit cheese to 1 oz (28 g) per day.  Eat more home-cooked food and less restaurant, buffet, and fast food.  Limit fried foods.  Cook foods using methods other than frying.  Limit canned vegetables. If you do use them, rinse them well to decrease the sodium.  When eating at a restaurant, ask that your food be prepared with less salt, or no salt if possible. WHAT FOODS CAN I EAT? Seek help from a dietitian for individual calorie needs. Grains Whole grain or whole wheat bread. Brown rice. Whole grain or whole wheat pasta. Quinoa, bulgur, and whole grain cereals. Low-sodium cereals. Corn or whole wheat flour tortillas. Whole grain cornbread. Whole grain crackers. Low-sodium crackers. Vegetables Fresh or frozen vegetables  (raw, steamed, roasted, or grilled). Low-sodium or reduced-sodium tomato and vegetable juices. Low-sodium or reduced-sodium tomato sauce and paste. Low-sodium or reduced-sodium canned vegetables.  Fruits All fresh, canned (in natural juice), or frozen fruits. Meat and Other Protein Products Ground beef (85% or leaner), grass-fed beef, or beef trimmed of fat. Skinless chicken or turkey. Ground chicken or turkey. Pork trimmed of fat. All fish and seafood. Eggs. Dried beans, peas, or lentils. Unsalted nuts and seeds. Unsalted canned beans. Dairy Low-fat dairy products, such as skim or 1% milk, 2% or reduced-fat cheeses, low-fat ricotta or cottage cheese, or plain low-fat yogurt. Low-sodium or reduced-sodium cheeses. Fats and Oils Tub margarines without trans fats. Light or reduced-fat mayonnaise and salad dressings (reduced sodium). Avocado. Safflower, olive, or canola oils. Natural peanut or almond butter. Other Unsalted popcorn and pretzels. The items listed above may not be a complete list of recommended foods or beverages. Contact your dietitian for more options. WHAT FOODS ARE NOT RECOMMENDED? Grains White bread. White pasta. White rice. Refined cornbread. Bagels and croissants. Crackers that contain trans fat. Vegetables Creamed or fried vegetables. Vegetables in a cheese sauce. Regular canned vegetables. Regular canned tomato sauce and paste. Regular tomato and vegetable juices. Fruits Dried fruits. Canned fruit in light or heavy syrup. Fruit juice. Meat and Other Protein Products Fatty cuts of meat. Ribs, chicken wings, bacon, sausage, bologna, salami, chitterlings, fatback, hot dogs, bratwurst, and packaged luncheon meats. Salted nuts and seeds. Canned beans with salt. Dairy Whole or 2% milk, cream, half-and-half, and cream cheese. Whole-fat or sweetened yogurt. Full-fat   cheeses or blue cheese. Nondairy creamers and whipped toppings. Processed cheese, cheese spreads, or cheese  curds. Condiments Onion and garlic salt, seasoned salt, table salt, and sea salt. Canned and packaged gravies. Worcestershire sauce. Tartar sauce. Barbecue sauce. Teriyaki sauce. Soy sauce, including reduced sodium. Steak sauce. Fish sauce. Oyster sauce. Cocktail sauce. Horseradish. Ketchup and mustard. Meat flavorings and tenderizers. Bouillon cubes. Hot sauce. Tabasco sauce. Marinades. Taco seasonings. Relishes. Fats and Oils Butter, stick margarine, lard, shortening, ghee, and bacon fat. Coconut, palm kernel, or palm oils. Regular salad dressings. Other Pickles and olives. Salted popcorn and pretzels. The items listed above may not be a complete list of foods and beverages to avoid. Contact your dietitian for more information. WHERE CAN I FIND MORE INFORMATION? [employer]: www.nhlbi.nih.gov/health/health-topics/topics/dash/   This information is not intended to replace advice given to you by your health care provider. Make sure you discuss any questions you have with your health care provider.   Document Released: 07/05/2011 Document Revised: 08/06/2014 Document Reviewed: 05/20/2013 Elsevier Interactive Patient Education 2016 [employer] Hypertension Hypertension, commonly called high blood pressure, is when the force of blood pumping through your arteries is too strong. Your arteries are the blood vessels that carry blood from your heart throughout your body. A blood pressure reading consists of a higher number over a lower number, such as 110/72. The higher number (systolic) is the pressure inside your arteries when your heart pumps. The lower number (diastolic) is the pressure inside your arteries when your heart relaxes. Ideally you want your blood pressure below 120/80. Hypertension forces your heart to work harder to pump blood. Your arteries may become narrow or stiff. Having untreated or uncontrolled hypertension can cause heart attack, stroke, kidney  disease, and other problems. RISK FACTORS Some risk factors for high blood pressure are controllable. Others are not.  Risk factors you cannot control include:   Race. You may be at higher risk if you are African American.  Age. Risk increases with age.  Gender. Men are at higher risk than women before age 45 years. After age 65, women are at higher risk than men. Risk factors you can control include:  Not getting enough exercise or physical activity.  Being overweight.  Getting too much fat, sugar, calories, or salt in your diet.  Drinking too much alcohol. SIGNS AND SYMPTOMS Hypertension does not usually cause signs or symptoms. Extremely high blood pressure (hypertensive crisis) may cause headache, anxiety, shortness of breath, and nosebleed. DIAGNOSIS To check if you have hypertension, your health care provider will measure your blood pressure while you are seated, with your arm held at the level of your heart. It should be measured at least twice using the same arm. Certain conditions can cause a difference in blood pressure between your right and left arms. A blood pressure reading that is higher than normal on one occasion does not mean that you need treatment. If it is not clear whether you have high blood pressure, you may be asked to return on a different day to have your blood pressure checked again. Or, you may be asked to monitor your blood pressure at home for 1 or more weeks. TREATMENT Treating high blood pressure includes making lifestyle changes and possibly taking medicine. Living a healthy lifestyle can help lower high blood pressure. You may need to change some of your habits. Lifestyle changes may include:  Following the DASH diet. This diet is high in fruits, vegetables, and whole grains.   It is low in salt, red meat, and added sugars.  Keep your sodium intake below 2,300 mg per day.  Getting at least 30-45 minutes of aerobic exercise at least 4 times per  week.  Losing weight if necessary.  Not smoking.  Limiting alcoholic beverages.  Learning ways to reduce stress. Your health care provider may prescribe medicine if lifestyle changes are not enough to get your blood pressure under control, and if one of the following is true:  You are 69-54 years of age and your systolic blood pressure is above 140.  You are 28 years of age or older, and your systolic blood pressure is above 150.  Your diastolic blood pressure is above 90.  You have diabetes, and your systolic blood pressure is over [id number] or your diastolic blood pressure is over 90.  You have kidney disease and your blood pressure is above 140/90.  You have heart disease and your blood pressure is above 140/90. Your personal target blood pressure may vary depending on your medical conditions, your age, and other factors. HOME CARE INSTRUCTIONS  Have your blood pressure rechecked as directed by your health care provider.   Take medicines only as directed by your health care provider. Follow the directions carefully. Blood pressure medicines must be taken as prescribed. The medicine does not work as well when you skip doses. Skipping doses also puts you at risk for problems.  Do not smoke.   Monitor your blood pressure at home as directed by your health care provider. SEEK MEDICAL CARE IF:   You think you are having a reaction to medicines taken.  You have recurrent headaches or feel dizzy.  You have swelling in your ankles.  You have trouble with your vision. SEEK IMMEDIATE MEDICAL CARE IF:  You develop a severe headache or confusion.  You have unusual weakness, numbness, or feel faint.  You have severe chest or abdominal pain.  You vomit repeatedly.  You have trouble breathing. MAKE SURE YOU:   Understand these instructions.  Will watch your condition.  Will get help right away if you are not doing well or get worse.   This information is not intended to  replace advice given to you by your health care provider. Make sure you discuss any questions you have with your health care provider.   Document Released: 07/16/2005 Document Revised: 11/30/2014 Document Reviewed: 05/08/2013 Elsevier Interactive Patient Education 2016 [employer] Heart Failure Heart failure is a condition in which the heart has trouble pumping blood. This means your heart does not pump blood efficiently for your body to work well. In some cases of heart failure, fluid may back up into your lungs or you may have swelling (edema) in your lower legs. Heart failure is usually a long-term (chronic) condition. It is important for you to take good care of yourself and follow your health care provider's treatment plan. CAUSES  Some health conditions can cause heart failure. Those health conditions include:  High blood pressure (hypertension). Hypertension causes the heart muscle to work harder than normal. When pressure in the blood vessels is high, the heart needs to pump (contract) with more force in order to circulate blood throughout the body. High blood pressure eventually causes the heart to become stiff and weak.  Coronary artery disease (CAD). CAD is the buildup of cholesterol and fat (plaque) in the arteries of the heart. The blockage in the arteries deprives the heart muscle of oxygen and blood. This can cause chest pain  and may lead to a heart attack. High blood pressure can also contribute to CAD.  Heart attack (myocardial infarction). A heart attack occurs when one or more arteries in the heart become blocked. The loss of oxygen damages the muscle tissue of the heart. When this happens, part of the heart muscle dies. The injured tissue does not contract as well and weakens the heart's ability to pump blood.  Abnormal heart valves. When the heart valves do not open and close properly, it can cause heart failure. This makes the heart muscle pump harder to keep the blood  flowing.  Heart muscle disease (cardiomyopathy or myocarditis). Heart muscle disease is damage to the heart muscle from a variety of causes. These can include drug or alcohol abuse, infections, or unknown reasons. These can increase the risk of heart failure.  Lung disease. Lung disease makes the heart work harder because the lungs do not work properly. This can cause a strain on the heart, leading it to fail.  Diabetes. Diabetes increases the risk of heart failure. High blood sugar contributes to high fat (lipid) levels in the blood. Diabetes can also cause slow damage to tiny blood vessels that carry important nutrients to the heart muscle. When the heart does not get enough oxygen and food, it can cause the heart to become weak and stiff. This leads to a heart that does not contract efficiently.  Other conditions can contribute to heart failure. These include abnormal heart rhythms, thyroid problems, and low blood counts (anemia). Certain unhealthy behaviors can increase the risk of heart failure, including:  Being overweight.  Smoking or chewing tobacco.  Eating foods high in fat and cholesterol.  Abusing illicit drugs or alcohol.  Lacking physical activity. SYMPTOMS  Heart failure symptoms may vary and can be hard to detect. Symptoms may include:  Shortness of breath with activity, such as climbing stairs.  Persistent cough.  Swelling of the feet, ankles, legs, or abdomen.  Unexplained weight gain.  Difficulty breathing when lying flat (orthopnea).  Waking from sleep because of the need to sit up and get more air.  Rapid heartbeat.  Fatigue and loss of energy.  Feeling light-headed, dizzy, or close to fainting.  Loss of appetite.  Nausea.  Increased urination during the night (nocturia). DIAGNOSIS  A diagnosis of heart failure is based on your history, symptoms, physical examination, and diagnostic tests. Diagnostic tests for heart failure may  include:  Echocardiography.  Electrocardiography.  Chest X-ray.  Blood tests.  Exercise stress test.  Cardiac angiography.  Radionuclide scans. TREATMENT  Treatment is aimed at managing the symptoms of heart failure. Medicines, behavioral changes, or surgical intervention may be necessary to treat heart failure.  Medicines to help treat heart failure may include:  Angiotensin-converting enzyme (ACE) inhibitors. This type of medicine blocks the effects of a blood protein called angiotensin-converting enzyme. ACE inhibitors relax (dilate) the blood vessels and help lower blood pressure.  Angiotensin receptor blockers (ARBs). This type of medicine blocks the actions of a blood protein called angiotensin. Angiotensin receptor blockers dilate the blood vessels and help lower blood pressure.  Water pills (diuretics). Diuretics cause the kidneys to remove salt and water from the blood. The extra fluid is removed through urination. This loss of extra fluid lowers the volume of blood the heart pumps.  Beta blockers. These prevent the heart from beating too fast and improve heart muscle strength.  Digitalis. This increases the force of the heartbeat.  Healthy behavior changes include:  Obtaining  and maintaining a healthy weight.  Stopping smoking or chewing tobacco.  Eating heart-healthy foods.  Limiting or avoiding alcohol.  Stopping illicit drug use.  Physical activity as directed by your health care provider.  Surgical treatment for heart failure may include:  A procedure to open blocked arteries, repair damaged heart valves, or remove damaged heart muscle tissue.  A pacemaker to improve heart muscle function and control certain abnormal heart rhythms.  An internal cardioverter defibrillator to treat certain serious abnormal heart rhythms.  A left ventricular assist device (LVAD) to assist the pumping ability of the heart. HOME CARE INSTRUCTIONS   Take medicines only  as directed by your health care provider. Medicines are important in reducing the workload of your heart, slowing the progression of heart failure, and improving your symptoms.  Do not stop taking your medicine unless directed by your health care provider.  Do not skip any dose of medicine.  Refill your prescriptions before you run out of medicine. Your medicines are needed every day.  Engage in moderate physical activity if directed by your health care provider. Moderate physical activity can benefit some people. The elderly and people with severe heart failure should consult with a health care provider for physical activity recommendations.  Eat heart-healthy foods. Food choices should be free of trans fat and low in saturated fat, cholesterol, and salt (sodium). Healthy choices include fresh or frozen fruits and vegetables, fish, lean meats, legumes, fat-free or low-fat dairy products, and whole grain or high fiber foods. Talk to a dietitian to learn more about heart-healthy foods.  Limit sodium if directed by your health care provider. Sodium restriction may reduce symptoms of heart failure in some people. Talk to a dietitian to learn more about heart-healthy seasonings.  Use healthy cooking methods. Healthy cooking methods include roasting, grilling, broiling, baking, poaching, steaming, or stir-frying. Talk to a dietitian to learn more about healthy cooking methods.  Limit fluids if directed by your health care provider. Fluid restriction may reduce symptoms of heart failure in some people.  Weigh yourself every day. Daily weights are important in the early recognition of excess fluid. You should weigh yourself every morning after you urinate and before you eat breakfast. Wear the same amount of clothing each time you weigh yourself. Record your daily weight. Provide your health care provider with your weight record.  Monitor and record your blood pressure if directed by your health care  provider.  Check your pulse if directed by your health care provider.  Lose weight if directed by your health care provider. Weight loss may reduce symptoms of heart failure in some people.  Stop smoking or chewing tobacco. Nicotine makes your heart work harder by causing your blood vessels to constrict. Do not use nicotine gum or patches before talking to your health care provider.  Keep all follow-up visits as directed by your health care provider. This is important.  Limit alcohol intake to no more than 1 drink per day for nonpregnant women and 2 drinks per day for men. One drink equals 12 ounces of beer, 5 ounces of wine, or 1 ounces of hard liquor. Drinking more than that is harmful to your heart. Tell your health care provider if you drink alcohol several times a week. Talk with your health care provider about whether alcohol is safe for you. If your heart has already been damaged by alcohol or you have severe heart failure, drinking alcohol should be stopped completely.  Stop illicit drug use.  Stay up-to-date with immunizations. It is especially important to prevent respiratory infections through current pneumococcal and influenza immunizations.  Manage other health conditions such as hypertension, diabetes, thyroid disease, or abnormal heart rhythms as directed by your health care provider.  Learn to manage stress.  Plan rest periods when fatigued.  Learn strategies to manage high temperatures. If the weather is extremely hot:  Avoid vigorous physical activity.  Use air conditioning or fans or seek a cooler location.  Avoid caffeine and alcohol.  Wear loose-fitting, lightweight, and light-colored clothing.  Learn strategies to manage cold temperatures. If the weather is extremely cold:  Avoid vigorous physical activity.  Layer clothes.  Wear mittens or gloves, a hat, and a scarf when going outside.  Avoid alcohol.  Obtain ongoing education and support as  needed.  Participate in or seek rehabilitation as needed to maintain or improve independence and quality of life. SEEK MEDICAL CARE IF:   You have a rapid weight gain.  You have increasing shortness of breath that is unusual for you.  You are unable to participate in your usual physical activities.  You tire easily.  You cough more than normal, especially with physical activity.  You have any or more swelling in areas such as your hands, feet, ankles, or abdomen.  You are unable to sleep because it is hard to breathe.  You feel like your heart is beating fast (palpitations).  You become dizzy or light-headed upon standing up. SEEK IMMEDIATE MEDICAL CARE IF:   You have difficulty breathing.  There is a change in mental status such as decreased alertness or difficulty with concentration.  You have a pain or discomfort in your chest.  You have an episode of fainting (syncope). MAKE SURE YOU:   Understand these instructions.  Will watch your condition.  Will get help right away if you are not doing well or get worse.   This information is not intended to replace advice given to you by your health care provider. Make sure you discuss any questions you have with your health care provider.   Document Released: 07/16/2005 Document Revised: 11/30/2014 Document Reviewed: 08/15/2012 Elsevier Interactive Patient Education [employer].

## 2015-07-14 NOTE — Progress Notes (Signed)
Patient ID: HALBERT REZNICK, male   DOB: 10/30/1970, 44 y.o.   MRN: [id number]   [place] Cocca, is a 44 y.o. male  [id number]  [id number]  DOB - 30-Sep-1970  Chief Complaint  Patient presents with  . Follow-up        Subjective:   Stelmo Cattanach is a 44 y.o. male with history of hypertension, hyperlipidemia, GERD, gout , generalized anxiety, asthma/COPD,  Diastolic heart failure, ongoing tobacco abuse and diverticulitis status post laparoscopic lysis of adhesions here today for a follow up visit. Patient is requesting medication refills. He has no new complaints today except for ongoing bilateral foot pain , rated at 4 out of 10 today. Patient also complained of ongoing insomnia. He claims he sleeps for only between 5-6 hours every night, he has problem initiating sleep mostly because of pain. Patient is currently on gabapentin and Tylenol threes. He declined referral to pain clinic because of co-pay. Patient has No headache, No chest pain, No abdominal pain - No Nausea, No new weakness tingling or numbness, No Cough - SOB. Patient continues to smoke a cigarette. No leg swelling. Denies PND , no orthopnea. He is not diabetic.  Problem  Chronic Hip Pain  Gastroesophageal Reflux Disease Without Esophagitis    ALLERGIES: Allergies  Allergen Reactions  . Morphine And Related Itching, Nausea And Vomiting and Other (See Comments)    "bad feeling"    PAST MEDICAL HISTORY: Past Medical History  Diagnosis Date  . Gout   . Diverticulitis   . GERD (gastroesophageal reflux disease)   . Hypertension     Does not see a cardiologist  . Anxiety   . Panic attacks   . Asthma     reports related to anxiety  . History of Clostridium difficile infection   . COPD (chronic obstructive pulmonary disease) ([place])   . Alcohol abuse   . Hypercholesterolemia   . Pneumonia   . Chronic bronchitis ([place])   . Migraine     "new onset" (03/11/2015)  . Arthritis     "hips, knees, back" (03/11/2015)    . Chronic lower back pain   . Chronic hip pain     "both"    MEDICATIONS AT HOME: Prior to Admission medications   Medication Sig Start Date End Date Taking? Authorizing Provider  acetaminophen-codeine (TYLENOL #3) 300-30 MG tablet Take 1 tablet by mouth every 4 (four) hours as needed for moderate pain. 07/14/15  Yes Tresa Garter, MD  albuterol (PROVENTIL HFA;VENTOLIN HFA) 108 (90 BASE) MCG/ACT inhaler Inhale 1-2 puffs into the lungs every 6 (six) hours as needed for wheezing or shortness of breath. 03/24/15  Yes Tresa Garter, MD  allopurinol (ZYLOPRIM) 300 MG tablet Take 1 tablet (300 mg total) by mouth every evening. 03/24/15  Yes Tresa Garter, MD  amLODipine (NORVASC) 5 MG tablet Take 1 tablet (5 mg total) by mouth daily. 04/06/15  Yes Renella Cunas, MD  aspirin 325 MG tablet Take 1 tablet (325 mg total) by mouth daily. 04/06/15  Yes Renella Cunas, MD  atorvastatin (LIPITOR) 40 MG tablet Take 1 tablet (40 mg total) by mouth every evening. Please substitute with any cheaper generic option 04/06/15  Yes Renella Cunas, MD  colchicine 0.6 MG tablet Take 1 tablet (0.6 mg total) by mouth daily. 03/24/15  Yes Tresa Garter, MD  diazepam (VALIUM) 5 MG tablet Take 5 mg by mouth daily as needed for anxiety.   Yes Historical Provider, MD  DULoxetine (  CYMBALTA) 30 MG capsule Take 1 capsule (30 mg total) by mouth daily. 07/14/15  Yes Tresa Garter, MD  famotidine (PEPCID) 20 MG tablet Take 1 tablet (20 mg total) by mouth daily. 07/14/15  Yes Tresa Garter, MD  furosemide (LASIX) 40 MG tablet Take 1 tablet (40 mg total) by mouth daily. 04/06/15  Yes Renella Cunas, MD  gabapentin (NEURONTIN) 300 MG capsule Take 1 capsule (300 mg total) by mouth 3 (three) times daily. 07/14/15  Yes Tresa Garter, MD  hydrOXYzine (ATARAX/VISTARIL) 25 MG tablet Take 1 tablet (25 mg total) by mouth 3 (three) times daily as needed. 03/24/15  Yes Tresa Garter, MD  indomethacin (INDOCIN)  25 MG capsule Take 1 capsule (25 mg total) by mouth 3 (three) times daily as needed. For gout flare up. 03/24/15  Yes Tresa Garter, MD  lisinopril (PRINIVIL,ZESTRIL) 40 MG tablet Take 1 tablet (40 mg total) by mouth daily. 04/06/15  Yes Renella Cunas, MD  metoprolol tartrate (LOPRESSOR) 25 MG tablet Take 1 tablet (25 mg total) by mouth 2 (two) times daily. 04/06/15  Yes Renella Cunas, MD  HYDROcodone-acetaminophen (NORCO/VICODIN) 5-325 MG per tablet Take 1-2 tablets by mouth every 6 (six) hours as needed for moderate pain or severe pain. Patient not taking: Reported on 07/14/2015 03/14/15   Ripudeep Krystal Eaton, MD  nicotine (NICODERM CQ - DOSED IN MG/24 HOURS) 21 mg/24hr patch Place 1 patch (21 mg total) onto the skin daily. Patient not taking: Reported on 07/14/2015 03/14/15   Ripudeep Krystal Eaton, MD     Objective:   Filed Vitals:   07/14/15 1420  BP: 119/81  Pulse: 80  Temp: 98.3 F (36.8 C)  TempSrc: Oral  Resp: 18  Height: 6\' 2"  (1.88 m)  Weight: 277 lb 6.4 oz (125.828 kg)  SpO2: 98%    Exam General appearance : Awake, alert, not in any distress. Speech Clear. Not toxic looking HEENT: Atraumatic and Normocephalic, pupils equally reactive to light and accomodation Neck: supple, no JVD. No cervical lymphadenopathy.  Chest:Good air entry bilaterally, no added sounds  CVS: S1 S2 regular, no murmurs.  Abdomen: Bowel sounds present, Non tender and not distended with no gaurding, rigidity or rebound. Extremities: B/L Lower Ext shows no edema, both legs are warm to touch Neurology: Awake alert, and oriented X 3, CN II-XII intact, Non focal Skin:No Rash  Data Review Lab Results  Component Value Date   HGBA1C 5.6 03/12/2015   HGBA1C 5.6 08/05/2013     Assessment & Plan   1. Generalized anxiety disorder  - DULoxetine (CYMBALTA) 30 MG capsule; Take 1 capsule (30 mg total) by mouth daily.  Dispense: 30 capsule; Refill: 3  2. Chronic pain syndrome  - DULoxetine (CYMBALTA) 30 MG  capsule; Take 1 capsule (30 mg total) by mouth daily.  Dispense: 30 capsule; Refill: 3 - gabapentin (NEURONTIN) 300 MG capsule; Take 1 capsule (300 mg total) by mouth 3 (three) times daily.  Dispense: 270 capsule; Refill: 3  3. Chronic hip pain, right  - acetaminophen-codeine (TYLENOL #3) 300-30 MG tablet; Take 1 tablet by mouth every 4 (four) hours as needed for moderate pain.  Dispense: 90 tablet; Refill: 0  4. Gastroesophageal reflux disease without esophagitis  - famotidine (PEPCID) 20 MG tablet; Take 1 tablet (20 mg total) by mouth daily.  Dispense: 90 tablet; Refill: 3  Jarl was counseled on the dangers of tobacco use, and was advised to quit. Reviewed strategies to maximize success, including  removing cigarettes and smoking materials from environment, stress management and support of family/friends.  Patient have been counseled extensively about nutrition and exercise  Return in about 3 months (around 10/12/2015) for Follow up Pain and comorbidities, Heart Failure and Hypertension.  The patient was given clear instructions to go to ER or return to medical center if symptoms don't improve, worsen or new problems develop. The patient verbalized understanding. The patient was told to call to get lab results if they haven't heard anything in the next week.   This note has been created with Surveyor, quantity. Any transcriptional errors are unintentional.    Angelica Chessman, MD, [place], [place], [place], [place] and [place], [place]   07/14/2015, 2:36 PM

## 2015-07-27 ENCOUNTER — Other Ambulatory Visit: Payer: Self-pay | Admitting: *Deleted

## 2015-07-27 DIAGNOSIS — J42 Unspecified chronic bronchitis: Secondary | ICD-10-CM

## 2015-07-27 DIAGNOSIS — G4733 Obstructive sleep apnea (adult) (pediatric): Secondary | ICD-10-CM

## 2015-07-27 MED ORDER — ALBUTEROL SULFATE HFA 108 (90 BASE) MCG/ACT IN AERS: 1.0000 | INHALATION_SPRAY | Freq: Four times a day (QID) | RESPIRATORY_TRACT | Status: DC | PRN

## 2015-07-27 NOTE — Telephone Encounter (Signed)
PASS PROGRAM 

## 2015-08-15 MED FILL — ?ALLOPURINOL 300 MG TABLETS: 300 | 30 days supply | Qty: 30 | Fill #4

## 2015-08-15 MED FILL — AMLODIPINE BESYLATE 5 MG TA: 5 | 30 days supply | Qty: 30 | Fill #3

## 2015-08-15 MED FILL — ?FAMOTIDINE 20 MG TABLET: 20 | 30 days supply | Qty: 30 | Fill #1

## 2015-08-15 MED FILL — ?METOPROLOL 25 MG TABLET: 25 | 30 days supply | Qty: 60 | Fill #0

## 2015-08-15 MED FILL — ?ATORVASTATIN 40MG TABLET: 40 | 30 days supply | Qty: 30 | Fill #4

## 2015-08-15 MED FILL — ?FUROSEMIDE 40 MG TABLET: 40 | 30 days supply | Qty: 30 | Fill #4

## 2015-08-15 MED FILL — LISINOPRIL 40 MG TABLET: 40 | 30 days supply | Qty: 30 | Fill #4

## 2015-08-17 ENCOUNTER — Other Ambulatory Visit: Payer: Self-pay | Admitting: Internal Medicine

## 2015-08-17 DIAGNOSIS — F411 Generalized anxiety disorder: Secondary | ICD-10-CM

## 2015-08-17 DIAGNOSIS — G4733 Obstructive sleep apnea (adult) (pediatric): Secondary | ICD-10-CM

## 2015-08-17 DIAGNOSIS — G894 Chronic pain syndrome: Secondary | ICD-10-CM

## 2015-08-17 MED ORDER — DULOXETINE HCL 30 MG PO CPEP: 30.0000 mg | ORAL_CAPSULE | Freq: Every day | ORAL | Status: DC

## 2015-08-17 MED ORDER — ALBUTEROL SULFATE HFA 108 (90 BASE) MCG/ACT IN AERS: 1.0000 | INHALATION_SPRAY | Freq: Four times a day (QID) | RESPIRATORY_TRACT | Status: DC | PRN

## 2015-08-24 ENCOUNTER — Ambulatory Visit: Payer: Medicaid Other | Admitting: Critical Care Medicine

## 2015-08-30 MED FILL — DULoxetine HCL 30 MG CPEP: 30 | 30 days supply | Qty: 30 | Fill #1

## 2015-08-30 MED FILL — GABAPENTIN 300 MG CAPSULE: 300 | 30 days supply | Qty: 90 | Fill #1

## 2015-08-30 MED FILL — VENTOLIN HFA 90 MCG INHALER: 108 (90 BAS | 25 days supply | Qty: 18 | Fill #1

## 2015-08-31 ENCOUNTER — Other Ambulatory Visit: Payer: Self-pay | Admitting: Internal Medicine

## 2015-08-31 DIAGNOSIS — G4733 Obstructive sleep apnea (adult) (pediatric): Secondary | ICD-10-CM

## 2015-08-31 MED ORDER — ALBUTEROL SULFATE HFA 108 (90 BASE) MCG/ACT IN AERS: 1.0000 | INHALATION_SPRAY | Freq: Four times a day (QID) | RESPIRATORY_TRACT | Status: DC | PRN

## 2015-09-16 MED FILL — LISINOPRIL 40 MG TABLET: 40 | 30 days supply | Qty: 30 | Fill #5

## 2015-09-16 MED FILL — ?FAMOTIDINE 20 MG TABLET: 20 | 30 days supply | Qty: 30 | Fill #2

## 2015-09-16 MED FILL — ?ALLOPURINOL 300 MG TABLETS: 300 | 30 days supply | Qty: 30 | Fill #5

## 2015-09-16 MED FILL — FUROSEMIDE 40 MG TABLET: 40 | 30 days supply | Qty: 30 | Fill #5

## 2015-09-16 MED FILL — METOPROLOL TARTRATE 25 MG T: 25 | 30 days supply | Qty: 60 | Fill #1

## 2015-09-16 MED FILL — AMLODIPINE BESYLATE 5 MG TA: 5 | 30 days supply | Qty: 30 | Fill #4

## 2015-09-27 ENCOUNTER — Other Ambulatory Visit: Payer: Self-pay | Admitting: *Deleted

## 2015-09-27 ENCOUNTER — Telehealth: Payer: Self-pay | Admitting: Internal Medicine

## 2015-09-27 DIAGNOSIS — M25551 Pain in right hip: Principal | ICD-10-CM

## 2015-09-27 DIAGNOSIS — G8929 Other chronic pain: Secondary | ICD-10-CM

## 2015-09-27 MED ORDER — ACETAMINOPHEN-CODEINE #3 300-30 MG PO TABS: 1.0000 | ORAL_TABLET | ORAL | Status: DC | PRN

## 2015-09-27 NOTE — Telephone Encounter (Signed)
Pt's wife calling to request a Refill for Tylenol 3 . Patient will be here tomorrow to see Dr Joya Gaskins  Thank you .

## 2015-09-28 ENCOUNTER — Encounter: Payer: Self-pay | Admitting: Critical Care Medicine

## 2015-09-28 ENCOUNTER — Ambulatory Visit: Payer: Medicaid Other | Attending: Critical Care Medicine | Admitting: Critical Care Medicine

## 2015-09-28 VITALS — BP 141/91 | HR 81 | Temp 98.0°F | Resp 20 | Ht 74.0 in | Wt 290.0 lb

## 2015-09-28 DIAGNOSIS — F1910 Other psychoactive substance abuse, uncomplicated: Secondary | ICD-10-CM

## 2015-09-28 DIAGNOSIS — I5040 Unspecified combined systolic (congestive) and diastolic (congestive) heart failure: Secondary | ICD-10-CM | POA: Insufficient documentation

## 2015-09-28 DIAGNOSIS — I5032 Chronic diastolic (congestive) heart failure: Secondary | ICD-10-CM | POA: Diagnosis not present

## 2015-09-28 DIAGNOSIS — Z720 Tobacco use: Secondary | ICD-10-CM

## 2015-09-28 DIAGNOSIS — Z888 Allergy status to other drugs, medicaments and biological substances status: Secondary | ICD-10-CM | POA: Diagnosis not present

## 2015-09-28 DIAGNOSIS — M109 Gout, unspecified: Secondary | ICD-10-CM | POA: Diagnosis not present

## 2015-09-28 DIAGNOSIS — I5022 Chronic systolic (congestive) heart failure: Secondary | ICD-10-CM

## 2015-09-28 DIAGNOSIS — E0590 Thyrotoxicosis, unspecified without thyrotoxic crisis or storm: Secondary | ICD-10-CM | POA: Insufficient documentation

## 2015-09-28 DIAGNOSIS — F1010 Alcohol abuse, uncomplicated: Secondary | ICD-10-CM | POA: Insufficient documentation

## 2015-09-28 DIAGNOSIS — I10 Essential (primary) hypertension: Secondary | ICD-10-CM | POA: Diagnosis not present

## 2015-09-28 DIAGNOSIS — F17200 Nicotine dependence, unspecified, uncomplicated: Secondary | ICD-10-CM | POA: Diagnosis not present

## 2015-09-28 DIAGNOSIS — J45909 Unspecified asthma, uncomplicated: Secondary | ICD-10-CM | POA: Insufficient documentation

## 2015-09-28 DIAGNOSIS — G4733 Obstructive sleep apnea (adult) (pediatric): Secondary | ICD-10-CM | POA: Insufficient documentation

## 2015-09-28 DIAGNOSIS — J449 Chronic obstructive pulmonary disease, unspecified: Secondary | ICD-10-CM

## 2015-09-28 DIAGNOSIS — Z79899 Other long term (current) drug therapy: Secondary | ICD-10-CM | POA: Insufficient documentation

## 2015-09-28 DIAGNOSIS — Z7982 Long term (current) use of aspirin: Secondary | ICD-10-CM | POA: Diagnosis not present

## 2015-09-28 DIAGNOSIS — E7800 Pure hypercholesterolemia, unspecified: Secondary | ICD-10-CM | POA: Insufficient documentation

## 2015-09-28 DIAGNOSIS — G8929 Other chronic pain: Secondary | ICD-10-CM | POA: Insufficient documentation

## 2015-09-28 DIAGNOSIS — R05 Cough: Secondary | ICD-10-CM | POA: Diagnosis not present

## 2015-09-28 DIAGNOSIS — K219 Gastro-esophageal reflux disease without esophagitis: Secondary | ICD-10-CM | POA: Insufficient documentation

## 2015-09-28 DIAGNOSIS — M1990 Unspecified osteoarthritis, unspecified site: Secondary | ICD-10-CM | POA: Diagnosis not present

## 2015-09-28 DIAGNOSIS — F17210 Nicotine dependence, cigarettes, uncomplicated: Secondary | ICD-10-CM | POA: Insufficient documentation

## 2015-09-28 DIAGNOSIS — E669 Obesity, unspecified: Secondary | ICD-10-CM | POA: Diagnosis not present

## 2015-09-28 MED ORDER — NICOTINE POLACRILEX 4 MG MT GUM: 4.0000 mg | CHEWING_GUM | OROMUCOSAL | Status: DC | PRN

## 2015-09-28 MED ORDER — METOPROLOL TARTRATE 50 MG PO TABS: 50.0000 mg | ORAL_TABLET | Freq: Two times a day (BID) | ORAL | Status: DC

## 2015-09-28 MED FILL — ?ATORVASTATIN 40MG TABLET: 40 | 30 days supply | Qty: 30 | Fill #5

## 2015-09-28 MED FILL — METOPROLOL TARTRATE 50 MG T: 50 | 60 days supply | Qty: 120 | Fill #0

## 2015-09-28 MED FILL — DULoxetine HCL 30 MG CPEP: 30 | 30 days supply | Qty: 30 | Fill #2

## 2015-09-28 MED FILL — ACETAMINOPHEN/COD #3 TABLET: 300-30 | 30 days supply | Qty: 90 | Fill #0

## 2015-09-28 NOTE — Progress Notes (Signed)
Subjective:    Patient ID: Preston [name], male    DOB: [birth year], 45 y.o.   MRN: [id number]  HPI Comments: Pt with fatigue as CC.  Pt is to f/u with CHF.    Weight 277 in 06/2015   Today 290.   Diastolic HF hx and obesity EF 0000000  Grade 2 diastolic dysfunction  Currently is dyspneic with exertion and stays tired all the time.  Snores at night.  Sleep study was normal.   Needs thyroid tested.  No real chest pain.  Notes some dyspnea at night and occ wheeze.  Pt will use inhaler    Congestive Heart Failure Presents for follow-up visit. Associated symptoms include fatigue and shortness of breath. Pertinent negatives include no chest pain. There is no history of CAD, DVT or PE.  Shortness of Breath This is a chronic problem. The problem occurs daily (only a few feet and is out of breath.). The problem has been unchanged. Associated symptoms include leg pain, leg swelling, orthopnea, PND and wheezing. Pertinent negatives include no chest pain, fever, headaches, hemoptysis or sputum production. The symptoms are aggravated by smoke, exercise, any activity and lying flat (1ppd. ). Associated symptoms comments: Chronic cough . Risk factors include smoking. He has tried beta agonist inhalers for the symptoms. The treatment provided moderate relief. His past medical history is significant for COPD, a heart failure and pneumonia. There is no history of CAD, DVT or PE.    Past Medical History  Diagnosis Date  . Gout   . Diverticulitis   . GERD (gastroesophageal reflux disease)   . Hypertension     Does not see a cardiologist  . Anxiety   . Panic attacks   . Asthma     reports related to anxiety  . History of Clostridium difficile infection   . COPD (chronic obstructive pulmonary disease) ([place])   . Alcohol abuse   . Hypercholesterolemia   . Pneumonia   . Chronic bronchitis ([place])   . Migraine     "new onset" (03/11/2015)  . Arthritis     "hips, knees, back" (03/11/2015)  . Chronic lower  back pain   . Chronic hip pain     "both"     Family History  Problem Relation Age of Onset  . Cancer Father     lymphoma     Social History   Social History  . Marital Status: Divorced    Spouse Name: N/A  . Number of Children: N/A  . Years of Education: N/A   Occupational History  . Not on file.   Social History Main Topics  . Smoking status: Current Every Day Smoker -- 1.00 packs/day for 30 years    Types: Cigarettes  . Smokeless tobacco: Former Systems developer    Types: Chew     Comment: "quit chewing in the 1990's"  . Alcohol Use: Yes     Comment: 3/4 beers a week  . Drug Use: No  . Sexual Activity: Yes   Other Topics Concern  . Not on file   Social History Narrative     Allergies  Allergen Reactions  . Morphine And Related Itching, Nausea And Vomiting and Other (See Comments)    "bad feeling"     Outpatient Prescriptions Prior to Visit  Medication Sig Dispense Refill  . acetaminophen-codeine (TYLENOL #3) 300-30 MG tablet Take 1 tablet by mouth every 4 (four) hours as needed for moderate pain. 90 tablet 0  . albuterol (PROVENTIL HFA;VENTOLIN HFA)  108 (90 Base) MCG/ACT inhaler Inhale 1-2 puffs into the lungs every 6 (six) hours as needed for wheezing or shortness of breath. 3 Inhaler 3  . allopurinol (ZYLOPRIM) 300 MG tablet Take 1 tablet (300 mg total) by mouth every evening. 90 tablet 3  . amLODipine (NORVASC) 5 MG tablet Take 1 tablet (5 mg total) by mouth daily. 90 tablet 11  . aspirin 325 MG tablet Take 1 tablet (325 mg total) by mouth daily. 30 tablet 11  . atorvastatin (LIPITOR) 40 MG tablet Take 1 tablet (40 mg total) by mouth every evening. Please substitute with any cheaper generic option 30 tablet 11  . colchicine 0.6 MG tablet Take 1 tablet (0.6 mg total) by mouth daily. (Patient taking differently: Take 0.6 mg by mouth daily as needed. ) 30 tablet 3  . diazepam (VALIUM) 5 MG tablet Take 5 mg by mouth daily as needed for anxiety.    . DULoxetine (CYMBALTA)  30 MG capsule Take 1 capsule (30 mg total) by mouth daily. 90 capsule 3  . famotidine (PEPCID) 20 MG tablet Take 1 tablet (20 mg total) by mouth daily. 90 tablet 3  . furosemide (LASIX) 40 MG tablet Take 1 tablet (40 mg total) by mouth daily. 90 tablet 3  . gabapentin (NEURONTIN) 300 MG capsule Take 1 capsule (300 mg total) by mouth 3 (three) times daily. 270 capsule 3  . hydrOXYzine (ATARAX/VISTARIL) 25 MG tablet Take 1 tablet (25 mg total) by mouth 3 (three) times daily as needed. 60 tablet 3  . indomethacin (INDOCIN) 25 MG capsule Take 1 capsule (25 mg total) by mouth 3 (three) times daily as needed. For gout flare up. 60 capsule 0  . lisinopril (PRINIVIL,ZESTRIL) 40 MG tablet Take 1 tablet (40 mg total) by mouth daily. 90 tablet 3  . metoprolol tartrate (LOPRESSOR) 25 MG tablet Take 1 tablet (25 mg total) by mouth 2 (two) times daily. 180 tablet 3  . HYDROcodone-acetaminophen (NORCO/VICODIN) 5-325 MG per tablet Take 1-2 tablets by mouth every 6 (six) hours as needed for moderate pain or severe pain. (Patient not taking: Reported on 07/14/2015) 20 tablet 0  . nicotine (NICODERM CQ - DOSED IN MG/24 HOURS) 21 mg/24hr patch Place 1 patch (21 mg total) onto the skin daily. (Patient not taking: Reported on 07/14/2015) 28 patch 2   No facility-administered medications prior to visit.      Review of Systems  Constitutional: Positive for fatigue. Negative for fever.  HENT: Negative for postnasal drip, sinus pressure, sneezing and trouble swallowing.   Respiratory: Positive for cough, shortness of breath and wheezing. Negative for hemoptysis, sputum production and choking.   Cardiovascular: Positive for orthopnea, leg swelling and PND. Negative for chest pain.  Gastrointestinal:       Notes GERD   Neurological: Negative for headaches.  Psychiatric/Behavioral: Positive for dysphoric mood.       Objective:   Physical Exam Filed Vitals:   09/28/15 1143  BP: 141/91  Pulse: 81  Temp: 98 F  (36.7 C)  TempSrc: Oral  Resp: 20  Height: 6\' 2"  (1.88 m)  Weight: 290 lb (131.543 kg)  SpO2: 98%   Wt Readings from Last 3 Encounters:  09/28/15 290 lb (131.543 kg)  07/14/15 277 lb 6.4 oz (125.828 kg)  04/06/15 273 lb (123.832 kg)    Gen: Pleasant, obese, in no distress,  normal affect  ENT: No lesions,  mouth clear,  oropharynx clear, no postnasal drip  Neck: No JVD, no TMG, no  carotid bruits  Lungs: No use of accessory muscles, no dullness to percussion, distant bs   Cardiovascular: RRR, heart sounds normal, no murmur or gallops, no peripheral edema  Abdomen: soft and NT, no HSM,  BS normal  Musculoskeletal: No deformities, no cyanosis or clubbing  Neuro: alert, non focal  Skin: Warm, no lesions or rashes         Assessment & Plan:  I personally reviewed all images and lab data in the [facility] system as well as any outside material available during this office visit and agree with the  radiology impressions.   OSA (obstructive sleep apnea) Sleep study normal No need for cpap  Nicotine abuse Ongoing tobacco use Pt counseled Nicotine replacement Rx offered   Chronic diastolic congestive heart failure Chronic diastolic heart failure ? Hyperthyroid component BP needs better control Plan Thyroid labs today Increase metoprolol to 50mg  twice daily ( can take 2 25mg  tab twice daily , refills are for 50mg  tab) No other med changes, cont lasix and ACE Inhibitor as is  Return 4 months,  COPD (chronic obstructive pulmonary disease) Copd with chronic bronchitis Ongoing tobacco use  Plan Trial nicotine lozenge 4mg  use 4-6 times daily for smoking cessation A visit with Smoking cessation counselor will be obtained Use albuterol as needed Lung function test to be obtained Return 4 months,   Bartlomiej was seen today for follow-up.  Diagnoses and all orders for this visit:  COPD with chronic bronchitis ([place]) -     Pulmonary Function Test; Future  Essential  hypertension, benign -     metoprolol tartrate (LOPRESSOR) 50 MG tablet; Take 1 tablet (50 mg total) by mouth 2 (two) times daily. -     Thyroid Panel With TSH  Chronic systolic heart failure ([facility])  Tobacco use disorder  OSA (obstructive sleep apnea)  Nicotine abuse  Chronic diastolic congestive heart failure ([facility])  Chronic obstructive pulmonary disease, unspecified COPD type ([place])  Other orders -     nicotine polacrilex (EQ NICOTINE POLACRILEX) 4 MG gum; Take 1 each (4 mg total) by mouth as needed for smoking cessation.    I had an extended discussion with the patient and or family lasting 10 minutes of a 25 minute visit including:  Smoking cessation

## 2015-09-28 NOTE — Patient Instructions (Signed)
Trial nicotine lozenge 4mg  use 4-6 times daily for smoking cessation A visit with Smoking cessation counselor will be obtained Thyroid labs today Use albuterol as needed Increase metoprolol to 50mg  twice daily ( can take 2 25mg  tab twice daily , refills are for 50mg  tab) Lung function test to be obtained Return 4 months, will call with lung function test result

## 2015-09-28 NOTE — Assessment & Plan Note (Signed)
Ongoing tobacco use Pt counseled Nicotine replacement Rx offered

## 2015-09-28 NOTE — Progress Notes (Signed)
Patient is here for FU CHF  Patient complains of SOB being present which causes Fatigue. Patient complains of right foot pain scaled currently at a 5. Pain is intermittent described as sharp pains, pins and needles, and throbbing pain.  Patient declined his flu shot today.  Patient received his prescription for Tylenol 3 today in the office.

## 2015-09-28 NOTE — Assessment & Plan Note (Signed)
Chronic diastolic heart failure ? Hyperthyroid component BP needs better control Plan Thyroid labs today Increase metoprolol to 50mg  twice daily ( can take 2 25mg  tab twice daily , refills are for 50mg  tab) No other med changes, cont lasix and ACE Inhibitor as is  Return 4 months,

## 2015-09-28 NOTE — Assessment & Plan Note (Signed)
Copd with chronic bronchitis Ongoing tobacco use  Plan Trial nicotine lozenge 4mg  use 4-6 times daily for smoking cessation A visit with Smoking cessation counselor will be obtained Use albuterol as needed Lung function test to be obtained Return 4 months,

## 2015-09-28 NOTE — Assessment & Plan Note (Signed)
Sleep study normal No need for cpap

## 2015-09-29 LAB — THYROID PANEL WITH TSH
FREE THYROXINE INDEX: 1.7 (ref 1.4–3.8)
T3 Uptake: 29 % (ref 22–35)
T4, Total: 6 ug/dL (ref 4.5–12.0)
TSH: 1.27 m[IU]/L (ref 0.40–4.50)

## 2015-09-30 NOTE — Telephone Encounter (Signed)
Patient received prescription in hand during office visit.  Medical Assistant left message on patient's home and cell voicemail. Voicemail states to give a call back to [place] with [facility] at [phone].

## 2015-09-30 NOTE — Telephone Encounter (Signed)
-----   Message from Elsie Stain, MD sent at 09/30/2015 11:11 AM EST ----- Let pt know Thyroid function is normal

## 2015-10-13 ENCOUNTER — Ambulatory Visit (HOSPITAL_BASED_OUTPATIENT_CLINIC_OR_DEPARTMENT_OTHER): Payer: Medicaid Other | Admitting: Internal Medicine

## 2015-10-13 ENCOUNTER — Ambulatory Visit: Payer: Medicaid Other | Admitting: Pharmacist

## 2015-10-13 ENCOUNTER — Encounter: Payer: Self-pay | Admitting: Internal Medicine

## 2015-10-13 ENCOUNTER — Ambulatory Visit (HOSPITAL_COMMUNITY)
Admission: RE | Admit: 2015-10-13 | Discharge: 2015-10-13 | Disposition: A | Discharge status: Home / Self Care | Payer: Medicaid Other | Source: Ambulatory Visit | Attending: Critical Care Medicine | Admitting: Critical Care Medicine

## 2015-10-13 VITALS — BP 141/87 | HR 88 | Temp 98.1°F | Resp 18 | Ht 74.0 in | Wt 292.0 lb

## 2015-10-13 DIAGNOSIS — M25551 Pain in right hip: Secondary | ICD-10-CM | POA: Diagnosis not present

## 2015-10-13 DIAGNOSIS — F17200 Nicotine dependence, unspecified, uncomplicated: Secondary | ICD-10-CM | POA: Diagnosis not present

## 2015-10-13 DIAGNOSIS — J449 Chronic obstructive pulmonary disease, unspecified: Secondary | ICD-10-CM | POA: Diagnosis not present

## 2015-10-13 DIAGNOSIS — J4489 Other specified chronic obstructive pulmonary disease: Secondary | ICD-10-CM

## 2015-10-13 DIAGNOSIS — I10 Essential (primary) hypertension: Secondary | ICD-10-CM | POA: Diagnosis not present

## 2015-10-13 DIAGNOSIS — F411 Generalized anxiety disorder: Secondary | ICD-10-CM

## 2015-10-13 DIAGNOSIS — G8929 Other chronic pain: Secondary | ICD-10-CM

## 2015-10-13 LAB — PULMONARY FUNCTION TEST
DL/VA % pred: 101 %
DL/VA: 4.93 ml/min/mmHg/L
DLCO unc % pred: 88 %
DLCO unc: 33.27 ml/min/mmHg
FEF 25-75 POST: 3.89 L/s
FEF 25-75 Pre: 3.02 L/sec
FEF2575-%CHANGE-POST: 28 %
FEF2575-%PRED-PRE: 72 %
FEF2575-%Pred-Post: 93 %
FEV1-%CHANGE-POST: 7 %
FEV1-%PRED-POST: 83 %
FEV1-%PRED-PRE: 77 %
FEV1-POST: 3.86 L
FEV1-PRE: 3.58 L
FEV1FVC-%CHANGE-POST: 1 %
FEV1FVC-%PRED-PRE: 95 %
FEV6-%Change-Post: 6 %
FEV6-%PRED-PRE: 82 %
FEV6-%Pred-Post: 88 %
FEV6-Post: 5.06 L
FEV6-Pre: 4.73 L
FEV6FVC-%Change-Post: 0 %
FEV6FVC-%Pred-Post: 103 %
FEV6FVC-%Pred-Pre: 102 %
FVC-%Change-Post: 6 %
FVC-%PRED-PRE: 80 %
FVC-%Pred-Post: 85 %
FVC-POST: 5.06 L
FVC-PRE: 4.75 L
POST FEV6/FVC RATIO: 100 %
PRE FEV1/FVC RATIO: 75 %
Post FEV1/FVC ratio: 76 %
Pre FEV6/FVC Ratio: 100 %

## 2015-10-13 MED ORDER — ALBUTEROL SULFATE (2.5 MG/3ML) 0.083% IN NEBU
2.5000 mg | INHALATION_SOLUTION | Freq: Once | RESPIRATORY_TRACT | Status: AC
  Administered 2015-10-13: 2.5 mg via RESPIRATORY_TRACT

## 2015-10-13 MED ORDER — NICOTINE POLACRILEX 4 MG MT LOZG: LOZENGE | OROMUCOSAL | Status: DC

## 2015-10-13 MED ORDER — ACETAMINOPHEN-CODEINE #3 300-30 MG PO TABS
1.0000 | ORAL_TABLET | ORAL | Status: DC | PRN
Start: 2015-10-13 — End: 2015-11-30

## 2015-10-13 NOTE — Progress Notes (Signed)
S:  Patient arrives in good spirits with his significant other.   Patient arrives for evaluation/assistance with tobacco dependence.  Patient was referred on 09/28/15 by Dr. Joya Gaskins.  Patient was last seen by Primary Care Provider on 10/13/15.   Age when started using tobacco on a daily basis 13. Number of Cigarettes per day 20. Smokes first cigarette 5 minutes after waking. Denies waking to smoke     Most recent quit attempt: never but has been hospitalized and was unable to smoke Longest time ever been tobacco free: 5 days during a hospital stay.  Medications (NRT, bupropion, varenicline) used in prior in past cessation efforts include: nicotine gum (poor teeth quality and doesn't like gum), bupropion (didn't work), nicotine patch (didn't work)  Rates IMPORTANCE of quitting tobacco on 1-10 scale of 7. Rates CONFIDENCE of quitting tobacco on 1-10 scale of 2.  Most common triggers to use tobacco include; boredom, stress, alcohol use (cutting back on alcohol use)  Motivation to quit: health (leg pain and breathing), money  A/P: severe Nicotine Dependence of 30 years duration in a patient who is fair candidate for success b/c of being able to cut down from 3 packs to 1 pack on his own.     Patient has poor dentition and chewing gum makes him uncomfortable so will switch to lozenges.  Initiated nicotine replacement tx with nicotine lozenges 4 mg. Patient counseled on purpose, proper use, and potential adverse effects, including GI upset. Also discussed ways to avoid triggers such as wood-working (a hobby of his). Patient will continue to cut back on cigarette use by at least 1 cigarette a week and replace as many cigarettes as he can with the nicotine lozenge. He has been successful in the past cutting back and I think this is a better method for him than abruptly stopping. However, patient will think about the date that he would like to set as his official quit date.   Written information  provided. Provided information on 1 800-QUIT NOW support program.  F/U phone call in 2 weeks.  F/U Rx Clinic Visit PRN.  Total time in face-to-face counseling 30 minutes.

## 2015-10-13 NOTE — Patient Instructions (Addendum)
Hip Pain Your hip is the joint between your upper legs and your lower pelvis. The bones, cartilage, tendons, and muscles of your hip joint perform a lot of work each day supporting your body weight and allowing you to move around. Hip pain can range from a minor ache to severe pain in one or both of your hips. Pain may be felt on the inside of the hip joint near the groin, or the outside near the buttocks and upper thigh. You may have swelling or stiffness as well.  HOME CARE INSTRUCTIONS   Take medicines only as directed by your health care provider.  Apply ice to the injured area:  Put ice in a plastic bag.  Place a towel between your skin and the bag.  Leave the ice on for 15-20 minutes at a time, 3-4 times a day.  Keep your leg raised (elevated) when possible to lessen swelling.  Avoid activities that cause pain.  Follow specific exercises as directed by your health care provider.  Sleep with a pillow between your legs on your most comfortable side.  Record how often you have hip pain, the location of the pain, and what it feels like. SEEK MEDICAL CARE IF:   You are unable to put weight on your leg.  Your hip is red or swollen or very tender to touch.  Your pain or swelling continues or worsens after 1 week.  You have increasing difficulty walking.  You have a fever. SEEK IMMEDIATE MEDICAL CARE IF:   You have fallen.  You have a sudden increase in pain and swelling in your hip. MAKE SURE YOU:   Understand these instructions.  Will watch your condition.  Will get help right away if you are not doing well or get worse.   This information is not intended to replace advice given to you by your health care provider. Make sure you discuss any questions you have with your health care provider.   Document Released: 01/03/2010 Document Revised: 08/06/2014 Document Reviewed: 03/12/2013 Elsevier Interactive Patient Education 2016 [place] DASH  stands for "Dietary Approaches to Stop Hypertension." The DASH eating plan is a healthy eating plan that has been shown to reduce high blood pressure (hypertension). Additional health benefits may include reducing the risk of type 2 diabetes mellitus, heart disease, and stroke. The DASH eating plan may also help with weight loss. WHAT DO I NEED TO KNOW ABOUT THE DASH EATING PLAN? For the DASH eating plan, you will follow these general guidelines:  Choose foods with a percent daily value for sodium of less than 5% (as listed on the food label).  Use salt-free seasonings or herbs instead of table salt or sea salt.  Check with your health care provider or pharmacist before using salt substitutes.  Eat lower-sodium products, often labeled as "lower sodium" or "no salt added."  Eat fresh foods.  Eat more vegetables, fruits, and low-fat dairy products.  Choose whole grains. Look for the word "whole" as the first word in the ingredient list.  Choose fish and skinless chicken or [place] more often than red meat. Limit fish, poultry, and meat to 6 oz (170 g) each day.  Limit sweets, desserts, sugars, and sugary drinks.  Choose heart-healthy fats.  Limit cheese to 1 oz (28 g) per day.  Eat more home-cooked food and less restaurant, buffet, and fast food.  Limit fried foods.  Cook foods using methods other than frying.  Limit canned vegetables. If you  do use them, rinse them well to decrease the sodium.  When eating at a restaurant, ask that your food be prepared with less salt, or no salt if possible. WHAT FOODS CAN I EAT? Seek help from a dietitian for individual calorie needs. Grains Whole grain or whole wheat bread. Brown rice. Whole grain or whole wheat pasta. Quinoa, bulgur, and whole grain cereals. Low-sodium cereals. Corn or whole wheat flour tortillas. Whole grain cornbread. Whole grain crackers. Low-sodium crackers. Vegetables Fresh or frozen vegetables (raw, steamed, roasted,  or grilled). Low-sodium or reduced-sodium tomato and vegetable juices. Low-sodium or reduced-sodium tomato sauce and paste. Low-sodium or reduced-sodium canned vegetables.  Fruits All fresh, canned (in natural juice), or frozen fruits. Meat and Other Protein Products Ground beef (85% or leaner), grass-fed beef, or beef trimmed of fat. Skinless chicken or [place]. Ground chicken or [place]. Pork trimmed of fat. All fish and seafood. Eggs. Dried beans, peas, or lentils. Unsalted nuts and seeds. Unsalted canned beans. Dairy Low-fat dairy products, such as skim or 1% milk, 2% or reduced-fat cheeses, low-fat ricotta or cottage cheese, or plain low-fat yogurt. Low-sodium or reduced-sodium cheeses. Fats and Oils Tub margarines without trans fats. Light or reduced-fat mayonnaise and salad dressings (reduced sodium). Avocado. Safflower, olive, or canola oils. Natural peanut or almond butter. Other Unsalted popcorn and pretzels. The items listed above may not be a complete list of recommended foods or beverages. Contact your dietitian for more options. WHAT FOODS ARE NOT RECOMMENDED? Grains White bread. White pasta. White rice. Refined cornbread. Bagels and croissants. Crackers that contain trans fat. Vegetables Creamed or fried vegetables. Vegetables in a cheese sauce. Regular canned vegetables. Regular canned tomato sauce and paste. Regular tomato and vegetable juices. Fruits Dried fruits. Canned fruit in light or heavy syrup. Fruit juice. Meat and Other Protein Products Fatty cuts of meat. Ribs, chicken wings, bacon, sausage, bologna, salami, chitterlings, fatback, hot dogs, bratwurst, and packaged luncheon meats. Salted nuts and seeds. Canned beans with salt. Dairy Whole or 2% milk, cream, half-and-half, and cream cheese. Whole-fat or sweetened yogurt. Full-fat cheeses or blue cheese. Nondairy creamers and whipped toppings. Processed cheese, cheese spreads, or cheese curds. Condiments Onion and  garlic salt, seasoned salt, table salt, and sea salt. Canned and packaged gravies. Worcestershire sauce. Tartar sauce. Barbecue sauce. Teriyaki sauce. Soy sauce, including reduced sodium. Steak sauce. Fish sauce. Oyster sauce. Cocktail sauce. Horseradish. Ketchup and mustard. Meat flavorings and tenderizers. Bouillon cubes. Hot sauce. Tabasco sauce. Marinades. Taco seasonings. Relishes. Fats and Oils Butter, stick margarine, lard, shortening, ghee, and bacon fat. Coconut, palm kernel, or palm oils. Regular salad dressings. Other Pickles and olives. Salted popcorn and pretzels. The items listed above may not be a complete list of foods and beverages to avoid. Contact your dietitian for more information. WHERE CAN I FIND MORE INFORMATION? National Heart, Lung, and Blood Institute: travelstabloid.com   This information is not intended to replace advice given to you by your health care provider. Make sure you discuss any questions you have with your health care provider.   Document Released: 07/05/2011 Document Revised: 08/06/2014 Document Reviewed: 05/20/2013 Elsevier Interactive Patient Education 2016 [employer]. Hypertension Hypertension, commonly called high blood pressure, is when the force of blood pumping through your arteries is too strong. Your arteries are the blood vessels that carry blood from your heart throughout your body. A blood pressure reading consists of a higher number over a lower number, such as 110/72. The higher number (systolic) is the pressure inside your  arteries when your heart pumps. The lower number (diastolic) is the pressure inside your arteries when your heart relaxes. Ideally you want your blood pressure below 120/80. Hypertension forces your heart to work harder to pump blood. Your arteries may become narrow or stiff. Having untreated or uncontrolled hypertension can cause heart attack, stroke, kidney disease, and other  problems. RISK FACTORS Some risk factors for high blood pressure are controllable. Others are not.  Risk factors you cannot control include:   Race. You may be at higher risk if you are African American.  Age. Risk increases with age.  Gender. Men are at higher risk than women before age 69 years. After age 41, women are at higher risk than men. Risk factors you can control include:  Not getting enough exercise or physical activity.  Being overweight.  Getting too much fat, sugar, calories, or salt in your diet.  Drinking too much alcohol. SIGNS AND SYMPTOMS Hypertension does not usually cause signs or symptoms. Extremely high blood pressure (hypertensive crisis) may cause headache, anxiety, shortness of breath, and nosebleed. DIAGNOSIS To check if you have hypertension, your health care provider will measure your blood pressure while you are seated, with your arm held at the level of your heart. It should be measured at least twice using the same arm. Certain conditions can cause a difference in blood pressure between your right and left arms. A blood pressure reading that is higher than normal on one occasion does not mean that you need treatment. If it is not clear whether you have high blood pressure, you may be asked to return on a different day to have your blood pressure checked again. Or, you may be asked to monitor your blood pressure at home for 1 or more weeks. TREATMENT Treating high blood pressure includes making lifestyle changes and possibly taking medicine. Living a healthy lifestyle can help lower high blood pressure. You may need to change some of your habits. Lifestyle changes may include:  Following the DASH diet. This diet is high in fruits, vegetables, and whole grains. It is low in salt, red meat, and added sugars.  Keep your sodium intake below 2,300 mg per day.  Getting at least 30-45 minutes of aerobic exercise at least 4 times per week.  Losing weight if  necessary.  Not smoking.  Limiting alcoholic beverages.  Learning ways to reduce stress. Your health care provider may prescribe medicine if lifestyle changes are not enough to get your blood pressure under control, and if one of the following is true:  You are 4-52 years of age and your systolic blood pressure is above 140.  You are 73 years of age or older, and your systolic blood pressure is above 150.  Your diastolic blood pressure is above 90.  You have diabetes, and your systolic blood pressure is over [id number] or your diastolic blood pressure is over 90.  You have kidney disease and your blood pressure is above 140/90.  You have heart disease and your blood pressure is above 140/90. Your personal target blood pressure may vary depending on your medical conditions, your age, and other factors. HOME CARE INSTRUCTIONS  Have your blood pressure rechecked as directed by your health care provider.   Take medicines only as directed by your health care provider. Follow the directions carefully. Blood pressure medicines must be taken as prescribed. The medicine does not work as well when you skip doses. Skipping doses also puts you at risk for problems.  Do  not smoke.   Monitor your blood pressure at home as directed by your health care provider. SEEK MEDICAL CARE IF:   You think you are having a reaction to medicines taken.  You have recurrent headaches or feel dizzy.  You have swelling in your ankles.  You have trouble with your vision. SEEK IMMEDIATE MEDICAL CARE IF:  You develop a severe headache or confusion.  You have unusual weakness, numbness, or feel faint.  You have severe chest or abdominal pain.  You vomit repeatedly.  You have trouble breathing. MAKE SURE YOU:   Understand these instructions.  Will watch your condition.  Will get help right away if you are not doing well or get worse.   This information is not intended to replace advice given to you  by your health care provider. Make sure you discuss any questions you have with your health care provider.   Document Released: 07/16/2005 Document Revised: 11/30/2014 Document Reviewed: 05/08/2013 Elsevier Interactive Patient Education [employer].

## 2015-10-13 NOTE — Progress Notes (Signed)
Patient is here for FU  Patient complains of foot pain on the sides "feeling raw". Patient complains of bilateral hip and lower back pain scaled currently at a 5.

## 2015-10-13 NOTE — Patient Instructions (Addendum)
You can call 1-800-QUIT-NOW for additional help if you need it - you can also call them for free supplies  You can do this - just be confident. I have sent the lozenges for you to [facility]  I will call you in 2 weeks to see how you are doing - but [place] if you need something sooner!   Smoking Cessation, Tips for Success If you are ready to quit smoking, congratulations! You have chosen to help yourself be healthier. Cigarettes bring nicotine, tar, carbon monoxide, and other irritants into your body. Your lungs, heart, and blood vessels will be able to work better without these poisons. There are many different ways to quit smoking. Nicotine gum, nicotine patches, a nicotine inhaler, or nicotine nasal spray can help with physical craving. Hypnosis, support groups, and medicines help break the habit of smoking. WHAT THINGS CAN I DO TO MAKE QUITTING EASIER?  Here are some tips to help you quit for good:  Pick a date when you will quit smoking completely. Tell all of your friends and family about your plan to quit on that date.  Do not try to slowly cut down on the number of cigarettes you are smoking. Pick a quit date and quit smoking completely starting on that day.  Throw away all cigarettes.   Clean and remove all ashtrays from your home, work, and car.  On a card, write down your reasons for quitting. Carry the card with you and read it when you get the urge to smoke.  Cleanse your body of nicotine. Drink enough water and fluids to keep your urine clear or pale yellow. Do this after quitting to flush the nicotine from your body.  Learn to predict your moods. Do not let a bad situation be your excuse to have a cigarette. Some situations in your life might tempt you into wanting a cigarette.  Never have "just one" cigarette. It leads to wanting another and another. Remind yourself of your decision to quit.  Change habits associated with smoking. If you smoked while driving or when feeling  stressed, try other activities to replace smoking. Stand up when drinking your coffee. Brush your teeth after eating. Sit in a different chair when you read the paper. Avoid alcohol while trying to quit, and try to drink fewer caffeinated beverages. Alcohol and caffeine may urge you to smoke.  Avoid foods and drinks that can trigger a desire to smoke, such as sugary or spicy foods and alcohol.  Ask people who smoke not to smoke around you.  Have something planned to do right after eating or having a cup of coffee. For example, plan to take a walk or exercise.  Try a relaxation exercise to calm you down and decrease your stress. Remember, you may be tense and nervous for the first 2 weeks after you quit, but this will pass.  Find new activities to keep your hands busy. Play with a pen, coin, or rubber band. Doodle or draw things on paper.  Brush your teeth right after eating. This will help cut down on the craving for the taste of tobacco after meals. You can also try mouthwash.   Use oral substitutes in place of cigarettes. Try using lemon drops, carrots, cinnamon sticks, or chewing gum. Keep them handy so they are available when you have the urge to smoke.  When you have the urge to smoke, try deep breathing.  Designate your home as a nonsmoking area.  If you are a heavy  smoker, ask your health care provider about a prescription for nicotine chewing gum. It can ease your withdrawal from nicotine.  Reward yourself. Set aside the cigarette money you save and buy yourself something nice.  Look for support from others. Join a support group or smoking cessation program. Ask someone at home or at work to help you with your plan to quit smoking.  Always ask yourself, "Do I need this cigarette or is this just a reflex?" Tell yourself, "Today, I choose not to smoke," or "I do not want to smoke." You are reminding yourself of your decision to quit.  Do not replace cigarette smoking with  electronic cigarettes (commonly called e-cigarettes). The safety of e-cigarettes is unknown, and some may contain harmful chemicals.  If you relapse, do not give up! Plan ahead and think about what you will do the next time you get the urge to smoke. HOW WILL I FEEL WHEN I QUIT SMOKING? You may have symptoms of withdrawal because your body is used to nicotine (the addictive substance in cigarettes). You may crave cigarettes, be irritable, feel very hungry, cough often, get headaches, or have difficulty concentrating. The withdrawal symptoms are only temporary. They are strongest when you first quit but will go away within 10-14 days. When withdrawal symptoms occur, stay in control. Think about your reasons for quitting. Remind yourself that these are signs that your body is healing and getting used to being without cigarettes. Remember that withdrawal symptoms are easier to treat than the major diseases that smoking can cause.  Even after the withdrawal is over, expect periodic urges to smoke. However, these cravings are generally short lived and will go away whether you smoke or not. Do not smoke! WHAT RESOURCES ARE AVAILABLE TO HELP ME QUIT SMOKING? Your health care provider can direct you to community resources or hospitals for support, which may include:  Group support.  Education.  Hypnosis.  Therapy.   This information is not intended to replace advice given to you by your health care provider. Make sure you discuss any questions you have with your health care provider.   Document Released: 04/13/2004 Document Revised: 08/06/2014 Document Reviewed: 01/01/2013 Elsevier Interactive Patient Education 2016 [employer].  Steps to Quit Smoking  Smoking tobacco can be harmful to your health and can affect almost every organ in your body. Smoking puts you, and those around you, at risk for developing many serious chronic diseases. Quitting smoking is difficult, but it is one of the best things  that you can do for your health. It is never too late to quit. WHAT ARE THE BENEFITS OF QUITTING SMOKING? When you quit smoking, you lower your risk of developing serious diseases and conditions, such as:  Lung cancer or lung disease, such as COPD.  Heart disease.  Stroke.  Heart attack.  Infertility.  Osteoporosis and bone fractures. Additionally, symptoms such as coughing, wheezing, and shortness of breath may get better when you quit. You may also find that you get sick less often because your body is stronger at fighting off colds and infections. If you are pregnant, quitting smoking can help to reduce your chances of having a baby of low birth weight. HOW DO I GET READY TO QUIT? When you decide to quit smoking, create a plan to make sure that you are successful. Before you quit:  Pick a date to quit. Set a date within the next two weeks to give you time to prepare.  Write down the reasons  why you are quitting. Keep this list in places where you will see it often, such as on your bathroom mirror or in your car or wallet.  Identify the people, places, things, and activities that make you want to smoke (triggers) and avoid them. Make sure to take these actions:  Throw away all cigarettes at home, at work, and in your car.  Throw away smoking accessories, such as Scientist, research (medical).  Clean your car and make sure to empty the ashtray.  Clean your home, including curtains and carpets.  Tell your family, friends, and coworkers that you are quitting. Support from your loved ones can make quitting easier.  Talk with your health care provider about your options for quitting smoking.  Find out what treatment options are covered by your health insurance. WHAT STRATEGIES CAN I USE TO QUIT SMOKING?  Talk with your healthcare provider about different strategies to quit smoking. Some strategies include:  Quitting smoking altogether instead of gradually lessening how much you smoke  over a period of time. Research shows that quitting "cold [place]" is more successful than gradually quitting.  Attending in-person counseling to help you build problem-solving skills. You are more likely to have success in quitting if you attend several counseling sessions. Even short sessions of 10 minutes can be effective.  Finding resources and support systems that can help you to quit smoking and remain smoke-free after you quit. These resources are most helpful when you use them often. They can include:  Online chats with a Social worker.  Telephone quitlines.  Printed Furniture conservator/restorer.  Support groups or group counseling.  Text messaging programs.  Mobile phone applications.  Taking medicines to help you quit smoking. (If you are pregnant or breastfeeding, talk with your health care provider first.) Some medicines contain nicotine and some do not. Both types of medicines help with cravings, but the medicines that include nicotine help to relieve withdrawal symptoms. Your health care provider may recommend:  Nicotine patches, gum, or lozenges.  Nicotine inhalers or sprays.  Non-nicotine medicine that is taken by mouth. Talk with your health care provider about combining strategies, such as taking medicines while you are also receiving in-person counseling. Using these two strategies together makes you more likely to succeed in quitting than if you used either strategy on its own. If you are pregnant or breastfeeding, talk with your health care provider about finding counseling or other support strategies to quit smoking. Do not take medicine to help you quit smoking unless told to do so by your health care provider. WHAT THINGS CAN I DO TO MAKE IT EASIER TO QUIT? Quitting smoking might feel overwhelming at first, but there is a lot that you can do to make it easier. Take these important actions:  Reach out to your family and friends and ask that they support and encourage you during  this time. Call telephone quitlines, reach out to support groups, or work with a counselor for support.  Ask people who smoke to avoid smoking around you.  Avoid places that trigger you to smoke, such as bars, parties, or smoke-break areas at work.  Spend time around people who do not smoke.  Lessen stress in your life, because stress can be a smoking trigger for some people. To lessen stress, try:  Exercising regularly.  Deep-breathing exercises.  Yoga.  Meditating.  Performing a body scan. This involves closing your eyes, scanning your body from head to toe, and noticing which parts of your body are  particularly tense. Purposefully relax the muscles in those areas.  Download or purchase mobile phone or tablet apps (applications) that can help you stick to your quit plan by providing reminders, tips, and encouragement. There are many free apps, such as QuitGuide from the [employer] Office manager for Disease Control and Prevention). You can find other support for quitting smoking (smoking cessation) through smokefree.gov and other websites. HOW WILL I FEEL WHEN I QUIT SMOKING? Within the first 24 hours of quitting smoking, you may start to feel some withdrawal symptoms. These symptoms are usually most noticeable 2-3 days after quitting, but they usually do not last beyond 2-3 weeks. Changes or symptoms that you might experience include:  Mood swings.  Restlessness, anxiety, or irritation.  Difficulty concentrating.  Dizziness.  Strong cravings for sugary foods in addition to nicotine.  Mild weight gain.  Constipation.  Nausea.  Coughing or a sore throat.  Changes in how your medicines work in your body.  A depressed mood.  Difficulty sleeping (insomnia). After the first 2-3 weeks of quitting, you may start to notice more positive results, such as:  Improved sense of smell and taste.  Decreased coughing and sore throat.  Slower heart rate.  Lower blood pressure.  Clearer  skin.  The ability to breathe more easily.  Fewer sick days. Quitting smoking is very challenging for most people. Do not get discouraged if you are not successful the first time. Some people need to make many attempts to quit before they achieve long-term success. Do your best to stick to your quit plan, and talk with your health care provider if you have any questions or concerns.   This information is not intended to replace advice given to you by your health care provider. Make sure you discuss any questions you have with your health care provider.   Document Released: 07/10/2001 Document Revised: 11/30/2014 Document Reviewed: 11/30/2014 Elsevier Interactive Patient Education [employer].

## 2015-10-14 NOTE — Progress Notes (Signed)
Patient ID: Preston [name], male   DOB: 04-03-1971, 45 y.o.   MRN: [id number]   Preston [name], is a 45 y.o. male  [id number]  [id number]  DOB - April 10, 1971  Chief Complaint  Patient presents with  . Follow-up    DM        Subjective:   Preston [name] is a 45 y.o. male history of hypertension, generalized anxiety disorder and panic attacks, gout chronic bronchitis and chronic low back pain here today for a follow up visit and for medication refill. Patient is complaining of severe low back pain and right hip pain since his fall in 2015. At that time x-ray of the right hip shows subtle linear lucencies in the proximal right femur suggestive of nondisplaced fracture of the proximal femur. MRI was recommended but not done. Patient continues to have pain and not able to enjoy activities of daily living. Patient continues to smoke heavily but wanting to quit and requesting help. Patient has No headache, No chest pain, No abdominal pain - No Nausea, No new weakness tingling or numbness. Patient recently saw a pulmonologist here in the clinic, PFT was ordered which showed mild COPD.  No problems updated.  ALLERGIES: Allergies  Allergen Reactions  . Morphine And Related Itching, Nausea And Vomiting and Other (See Comments)    "bad feeling"    PAST MEDICAL HISTORY: Past Medical History  Diagnosis Date  . Gout   . Diverticulitis   . GERD (gastroesophageal reflux disease)   . Hypertension     Does not see a cardiologist  . Anxiety   . Panic attacks   . Asthma     reports related to anxiety  . History of Clostridium difficile infection   . COPD (chronic obstructive pulmonary disease) ([place])   . Alcohol abuse   . Hypercholesterolemia   . Pneumonia   . Chronic bronchitis ([place])   . Migraine     "new onset" (03/11/2015)  . Arthritis     "hips, knees, back" (03/11/2015)  . Chronic lower back pain   . Chronic hip pain     "both"    MEDICATIONS AT HOME: Prior to Admission  medications   Medication Sig Start Date End Date Taking? Authorizing Provider  acetaminophen-codeine (TYLENOL #3) 300-30 MG tablet Take 1 tablet by mouth every 4 (four) hours as needed for moderate pain. 10/13/15  Yes Tresa Garter, MD  albuterol (PROVENTIL HFA;VENTOLIN HFA) 108 (90 Base) MCG/ACT inhaler Inhale 1-2 puffs into the lungs every 6 (six) hours as needed for wheezing or shortness of breath. 08/31/15  Yes Tresa Garter, MD  allopurinol (ZYLOPRIM) 300 MG tablet Take 1 tablet (300 mg total) by mouth every evening. 03/24/15  Yes Tresa Garter, MD  amLODipine (NORVASC) 5 MG tablet Take 1 tablet (5 mg total) by mouth daily. 04/06/15  Yes Renella Cunas, MD  aspirin 325 MG tablet Take 1 tablet (325 mg total) by mouth daily. 04/06/15  Yes Renella Cunas, MD  atorvastatin (LIPITOR) 40 MG tablet Take 1 tablet (40 mg total) by mouth every evening. Please substitute with any cheaper generic option 04/06/15  Yes Renella Cunas, MD  colchicine 0.6 MG tablet Take 1 tablet (0.6 mg total) by mouth daily. Patient taking differently: Take 0.6 mg by mouth daily as needed.  03/24/15  Yes Tresa Garter, MD  diazepam (VALIUM) 5 MG tablet Take 5 mg by mouth daily as needed for anxiety.   Yes Historical Provider, MD  DULoxetine (  CYMBALTA) 30 MG capsule Take 1 capsule (30 mg total) by mouth daily. 08/17/15  Yes Tresa Garter, MD  famotidine (PEPCID) 20 MG tablet Take 1 tablet (20 mg total) by mouth daily. 07/14/15  Yes Tresa Garter, MD  furosemide (LASIX) 40 MG tablet Take 1 tablet (40 mg total) by mouth daily. 04/06/15  Yes Renella Cunas, MD  gabapentin (NEURONTIN) 300 MG capsule Take 1 capsule (300 mg total) by mouth 3 (three) times daily. 07/14/15  Yes Tresa Garter, MD  hydrOXYzine (ATARAX/VISTARIL) 25 MG tablet Take 1 tablet (25 mg total) by mouth 3 (three) times daily as needed. 03/24/15  Yes Tresa Garter, MD  indomethacin (INDOCIN) 25 MG capsule Take 1 capsule (25 mg total) by  mouth 3 (three) times daily as needed. For gout flare up. 03/24/15  Yes Tresa Garter, MD  lisinopril (PRINIVIL,ZESTRIL) 40 MG tablet Take 1 tablet (40 mg total) by mouth daily. 04/06/15  Yes Renella Cunas, MD  metoprolol tartrate (LOPRESSOR) 50 MG tablet Take 1 tablet (50 mg total) by mouth 2 (two) times daily. 09/28/15  Yes Elsie Stain, MD  nicotine polacrilex (COMMIT) 4 MG lozenge Use 1 lozenge as needed every 1-2 hours for 6 weeks, then 1 lozenge every 2-4 hours for 3 weeks, then 1 lozenge every 4-8 hours for 3 weeks 10/13/15   Tresa Garter, MD     Objective:   Filed Vitals:   10/13/15 1419  BP: 141/87  Pulse: 88  Temp: 98.1 F (36.7 C)  TempSrc: Oral  Resp: 18  Height: 6\' 2"  (1.88 m)  Weight: 292 lb (132.45 kg)  SpO2: 97%    Exam General appearance : Awake, alert, not in any distress. Speech Clear. Not toxic looking HEENT: Atraumatic and Normocephalic, pupils equally reactive to light and accomodation Neck: supple, no JVD. No cervical lymphadenopathy.  Chest:Good air entry bilaterally, no added sounds  CVS: S1 S2 regular, no murmurs.  Abdomen: Bowel sounds present, Non tender and not distended with no gaurding, rigidity or rebound. Extremities: B/L Lower Ext shows no edema, both legs are warm to touch Neurology: Awake alert, and oriented X 3, CN II-XII intact, Non focal Skin:No Rash  Data Review Lab Results  Component Value Date   HGBA1C 5.6 03/12/2015   HGBA1C 5.6 08/05/2013     Assessment & Plan   1. Chronic hip pain, right  - acetaminophen-codeine (TYLENOL #3) 300-30 MG tablet; Take 1 tablet by mouth every 4 (four) hours as needed for moderate pain.  Dispense: 90 tablet; Refill: 0 - MR Hip Right W Wo Contrast; Future  2. Tobacco use disorder  Preston [name] was counseled on the dangers of tobacco use, and was advised to quit. Reviewed strategies to maximize success, including removing cigarettes and smoking materials from environment, stress management  and support of family/friends.   3. Essential hypertension, benign We have discussed target BP range and blood pressure goal. I have advised patient to check BP regularly and to call us back or report to clinic if the numbers are consistently higher than 140/90. We discussed the importance of compliance with medical therapy and DASH diet recommended, consequences of uncontrolled hypertension discussed.   - continue current BP medications  4. Generalized anxiety disorder Continue duloxetine and hydroxyzine Patient was counseled extensively  Patient have been counseled extensively about nutrition and exercise  Return in about 3 months (around 01/13/2016) for Follow up Pain and comorbidities, Gout, Generalized Anxiety Disorder.  The patient was given  clear instructions to go to ER or return to medical center if symptoms don't improve, worsen or new problems develop. The patient verbalized understanding. The patient was told to call to get lab results if they haven't heard anything in the next week.   This note has been created with Surveyor, quantity. Any transcriptional errors are unintentional.    Angelica Chessman, MD, [place], [place], [place], [place] and [name], [place]   10/14/2015, 6:08 PM

## 2015-10-17 MED FILL — ?FAMOTIDINE 20 MG TABLET: 20 | 30 days supply | Qty: 30 | Fill #3

## 2015-10-17 MED FILL — AMLODIPINE BESYLATE 5 MG TA: 5 | 30 days supply | Qty: 30 | Fill #5

## 2015-10-17 MED FILL — LISINOPRIL 40 MG TABLET: 40 | 30 days supply | Qty: 30 | Fill #6

## 2015-10-17 MED FILL — GABAPENTIN 300 MG CAPSULE: 300 | 30 days supply | Qty: 90 | Fill #2

## 2015-10-17 MED FILL — ?ALLOPURINOL 300 MG TABLETS: 300 | 30 days supply | Qty: 30 | Fill #6

## 2015-10-17 MED FILL — FUROSEMIDE 40 MG TABLET: 40 | 30 days supply | Qty: 30 | Fill #6

## 2015-10-18 ENCOUNTER — Telehealth: Payer: Self-pay | Admitting: *Deleted

## 2015-10-18 NOTE — Telephone Encounter (Signed)
Patient verified DOB Patient is aware of PFT showing good results, only mild obstruction.  Patient advised to quit smoking to increase functionality. Patient advised to continue with current medications, no other inhalers needed. Patient expressed his understanding and had no further questions at this time.

## 2015-10-18 NOTE — Telephone Encounter (Signed)
-----   Message from Elsie Stain, MD sent at 10/17/2015  4:07 PM EDT ----- I tried to call the pt,  I left a VM on his cell phone.  Can you try to reach him, tell him his PFTs look  Good, only mild obstruction.  Main Rx is stop smoking , use albuterol as needed,  No other inhalers needed.

## 2015-10-25 ENCOUNTER — Encounter (HOSPITAL_COMMUNITY): Payer: Self-pay | Admitting: Vascular Surgery

## 2015-10-25 ENCOUNTER — Encounter (HOSPITAL_COMMUNITY)
Admission: RE | Admit: 2015-10-25 | Discharge: 2015-10-25 | Disposition: A | Discharge status: Home / Self Care | Payer: Medicaid Other | Source: Ambulatory Visit | Attending: Internal Medicine | Admitting: Internal Medicine

## 2015-10-25 ENCOUNTER — Encounter (HOSPITAL_COMMUNITY): Payer: Self-pay

## 2015-10-25 DIAGNOSIS — J449 Chronic obstructive pulmonary disease, unspecified: Secondary | ICD-10-CM | POA: Insufficient documentation

## 2015-10-25 DIAGNOSIS — Z7982 Long term (current) use of aspirin: Secondary | ICD-10-CM | POA: Insufficient documentation

## 2015-10-25 DIAGNOSIS — I509 Heart failure, unspecified: Secondary | ICD-10-CM | POA: Diagnosis not present

## 2015-10-25 DIAGNOSIS — K219 Gastro-esophageal reflux disease without esophagitis: Secondary | ICD-10-CM | POA: Insufficient documentation

## 2015-10-25 DIAGNOSIS — I110 Hypertensive heart disease with heart failure: Secondary | ICD-10-CM | POA: Diagnosis not present

## 2015-10-25 DIAGNOSIS — Z01818 Encounter for other preprocedural examination: Secondary | ICD-10-CM | POA: Insufficient documentation

## 2015-10-25 DIAGNOSIS — E785 Hyperlipidemia, unspecified: Secondary | ICD-10-CM | POA: Diagnosis not present

## 2015-10-25 DIAGNOSIS — Z01812 Encounter for preprocedural laboratory examination: Secondary | ICD-10-CM | POA: Diagnosis not present

## 2015-10-25 DIAGNOSIS — Z79899 Other long term (current) drug therapy: Secondary | ICD-10-CM | POA: Diagnosis not present

## 2015-10-25 DIAGNOSIS — F17200 Nicotine dependence, unspecified, uncomplicated: Secondary | ICD-10-CM | POA: Insufficient documentation

## 2015-10-25 HISTORY — DX: Heart failure, unspecified: I50.9

## 2015-10-25 LAB — COMPREHENSIVE METABOLIC PANEL
ALBUMIN: 4.4 g/dL (ref 3.5–5.0)
ALT: 48 U/L (ref 17–63)
ANION GAP: 11 (ref 5–15)
AST: 24 U/L (ref 15–41)
Alkaline Phosphatase: 79 U/L (ref 38–126)
BUN: 8 mg/dL (ref 6–20)
CHLORIDE: 102 mmol/L (ref 101–111)
CO2: 22 mmol/L (ref 22–32)
Calcium: 9.7 mg/dL (ref 8.9–10.3)
Creatinine, Ser: 0.74 mg/dL (ref 0.61–1.24)
GFR calc Af Amer: >60 mL/min (ref >60)
GFR calc non Af Amer: >60 mL/min (ref >60)
GLUCOSE: 232 mg/dL — AB (ref 65–99)
POTASSIUM: 4 mmol/L (ref 3.5–5.1)
SODIUM: 135 mmol/L (ref 135–145)
Total Bilirubin: 0.5 mg/dL (ref 0.3–1.2)
Total Protein: 7.8 g/dL (ref 6.5–8.1)

## 2015-10-25 LAB — CBC
HCT: 47.8 % (ref 39.0–52.0)
HEMOGLOBIN: 15.7 g/dL (ref 13.0–17.0)
MCH: 29.8 pg (ref 26.0–34.0)
MCHC: 32.8 g/dL (ref 30.0–36.0)
MCV: 90.9 fL (ref 78.0–100.0)
PLATELETS: 295 10*3/uL (ref 150–400)
RBC: 5.26 MIL/uL (ref 4.22–5.81)
RDW: 13.1 % (ref 11.5–15.5)
WBC: 10.7 10*3/uL — ABNORMAL HIGH (ref 4.0–10.5)

## 2015-10-25 NOTE — Progress Notes (Signed)
Pt denies any acute cardiopulmonary issues. Pt stated that he is under the care of Dr. Joya Gaskins, Cardiology.  Pt denies having a stress test and cardiac cath. Ebony Hail, [place], Anesthesia, advised that a CBC and CMP be drawn given the pt history. Pt had a positive STOP -BANG Assessment during PAT visit today but had a normal sleep study result 03/2015 ( in [place]). Pt made aware to stop taking vitamins and herbal medications. Pt verbalized understanding of all pre-op instructions.

## 2015-10-25 NOTE — Pre-Procedure Instructions (Signed)
    [place]  10/25/2015      [id number] - [place], [name] [place] [address] Phone: [phone] Fax: [place], [address] [address] Phone: [phone] Fax: [phone]    Your procedure is scheduled on Thursday, October 27, 2015  Report to [facility] Admitting at 8:45 A.M.  Call this number if you have problems the morning of surgery:  [phone]   Remember:  Do not eat food or drink liquids after midnight Wednesday, October 26, 2015  Take these medicines the morning of surgery with A SIP OF WATER :amLODipine (NORVASC), DULoxetine (CYMBALTA), famotidine (PEPCID), gabapentin (NEURONTIN), metoprolol tartrate (LOPRESSOR), if needed: hydrOXYzine (ATARAX/VISTARIL)  OR  diazepam (VALIUM) for anxiety,  Colchicine, acetaminophen-codeine (TYLENOL #3) for moderate pain, albuterol (PROVENTIL HFA;VENTOLIN HFA) inhaler for wheezing or shortness of breath ( Bring inhaler in with you ) .  Do not wear jewelry, make-up or nail polish.  Do not wear lotions, powders, or perfumes.  You may wear deodorant.  Do not shave 48 hours prior to surgery.  Men may shave face and neck.  Do not bring valuables to the hospital.  [facility] is not responsible for any belongings or valuables.  Contacts, dentures or bridgework may not be worn into surgery.  Leave your suitcase in the car.  After surgery it may be brought to your room.  For patients admitted to the hospital, discharge time will be determined by your treatment team.  Patients discharged the day of surgery will not be allowed to drive home.   Name and phone number of your driver:  Special instructions:N/A  Please read over the following fact sheets that you were given. Pain Booklet and Coughing and Deep Breathing

## 2015-10-25 NOTE — Progress Notes (Signed)
   10/25/15 1413  OBSTRUCTIVE SLEEP APNEA  Have you ever been diagnosed with sleep apnea through a sleep study? No (Pt had normal result for sleep study)  Do you snore loudly (loud enough to be heard through closed doors)?  1  Do you often feel tired, fatigued, or sleepy during the daytime (such as falling asleep during driving or talking to someone)? 0  Has anyone observed you stop breathing during your sleep? 1  Do you have, or are you being treated for high blood pressure? 1  BMI more than 35 kg/m2? 1  Age > 50 (1-yes) 0  Neck circumference greater than:Male 16 inches or larger, Male 17inches or larger? 1  Male Gender (Yes=1) 1  Obstructive Sleep Apnea Score 6

## 2015-10-26 NOTE — Progress Notes (Signed)
Anesthesia Chart Review:  Pt is a 45 year old male scheduled for MRI of R hip on 10/27/2015.   PCP is Dr. Doreene Burke.   PMH includes:  CHF, HTN, hyperlipidemia, COPD, chronic bronchitis, asthma, hx alcohol abuse (pt denies), GERD. Current smoker. BMI 36  Medications include: albuterol, amlodipine, ASA, lipitor, pepcid, lasix, lisinopril, metoprolol  Preoperative labs reviewed.  Glucose 232. No dx DM. I routed labs to Dr. Doreene Burke for his review.   EKG 03/12/15: NSR. Possible Left atrial enlargement. Incomplete RBBB. Nonspecific T wave abnormality. Prolonged QT  Echo 03/12/15:  - Left ventricle: The cavity size was normal. Wall thickness was increased in a pattern of mild LVH. Systolic function was normal. The estimated ejection fraction was in the range of 55% to 60%. Wall motion was normal; there were no regional wall motion abnormalities. Features are consistent with a pseudonormal left ventricular filling pattern, with concomitant abnormal relaxation and increased filling pressure (grade 2 diastolic dysfunction).  H&P noted in Epic by Dr. Doreene Burke 10/14/15.   If no changes, I anticipate pt can proceed with surgery as scheduled.   Willeen Cass, FNP-BC [facility]/Anesthesiology Phone: [phone] 10/26/2015 8:55 AM

## 2015-10-27 ENCOUNTER — Encounter (HOSPITAL_COMMUNITY): Admission: RE | Payer: Self-pay | Source: Ambulatory Visit

## 2015-10-27 ENCOUNTER — Telehealth: Payer: Self-pay | Admitting: Pharmacist

## 2015-10-27 ENCOUNTER — Ambulatory Visit (HOSPITAL_COMMUNITY): Admission: RE | Admit: 2015-10-27 | Payer: Medicaid Other | Source: Ambulatory Visit | Admitting: Internal Medicine

## 2015-10-27 ENCOUNTER — Ambulatory Visit (HOSPITAL_COMMUNITY): Admission: RE | Admit: 2015-10-27 | Payer: Medicaid Other | Source: Ambulatory Visit

## 2015-10-27 SURGERY — RADIOLOGY WITH ANESTHESIA
Anesthesia: Choice | Laterality: Right

## 2015-10-27 MED FILL — !VENTOLIN HFA INHALER: 108 (90 BAS | 25 days supply | Qty: 18 | Fill #2

## 2015-10-27 MED FILL — DULoxetine HCL 30 MG CPEP: 30 | 30 days supply | Qty: 30 | Fill #3

## 2015-10-27 MED FILL — ATORVASTATIN 40 MG TABLET: 40 | 30 days supply | Qty: 30 | Fill #6

## 2015-10-27 NOTE — Telephone Encounter (Signed)
I called patient to follow up on smoking cessation progress. At our last visit he was smoking 20 cigarettes per day. The plan was for him to cut back on at least 1 cigarette per week and to use nicotine lozenges.   The first number that I called was his wife's mobile though it is listed as the primary contact. She was presented at the visit and the patient has consented to me talking to her. She reported that she had not picked up his nicotine lozenges yet but that he has been cutting back in his cigarette use. She asked that I try to call him at the [phone] number.  I called that number but he did not answer. I left a HIPAA-compliant message asking that he call me back. Will attempt to contact him again next week if I do not hear back from him.

## 2015-11-08 ENCOUNTER — Telehealth: Payer: Self-pay | Admitting: Pharmacist

## 2015-11-08 NOTE — Telephone Encounter (Signed)
I called patient to follow up on smoking cessation progress. At our last visit he was smoking 20 cigarettes per day. The plan was for him to cut back on at least 1 cigarette per week and to use nicotine lozenges.   I left a HIPAA-compliant message asking that he call me back.

## 2015-11-10 ENCOUNTER — Telehealth: Payer: Self-pay | Admitting: *Deleted

## 2015-11-10 NOTE — Telephone Encounter (Signed)
-----   Message from Tresa Garter, MD sent at 11/08/2015  3:27 PM EDT ----- Please inform patient that his comprehensive metabolic panel is normal. His lung function test showed mild obstruction which could be from cigarette smoking. Encouraged patient to quit smoking, keep appointment with Olivia Mackie for smoking cessation counseling.

## 2015-11-10 NOTE — Telephone Encounter (Signed)
Patient verified DOB Patient is aware of CMP being normal. Patient is aware of lung function showing mild obstruction due cigarette smoke. Patient kept his appointment with Preston [name] for smoking cessation. Patient also needs MRI rescheduled. MA will complete upon returning on Monday. No further questions at this time.

## 2015-11-14 MED FILL — ALLOPURINOL 300 MG TABLET: 300 | 30 days supply | Qty: 30 | Fill #7

## 2015-11-14 MED FILL — AMLODIPINE BESYLATE 5 MG TA: 5 | 30 days supply | Qty: 30 | Fill #6

## 2015-11-14 MED FILL — ?FAMOTIDINE 20 MG TABLET: 20 | 30 days supply | Qty: 30 | Fill #4

## 2015-11-16 MED FILL — LISINOPRIL 40 MG TABLET: 40 | 30 days supply | Qty: 30 | Fill #7

## 2015-11-16 MED FILL — GABAPENTIN 300 MG CAPSULE: 300 | 30 days supply | Qty: 90 | Fill #3 | Status: TO

## 2015-11-25 ENCOUNTER — Other Ambulatory Visit: Payer: Self-pay | Admitting: Internal Medicine

## 2015-11-30 ENCOUNTER — Other Ambulatory Visit: Payer: Self-pay | Admitting: Internal Medicine

## 2015-11-30 MED FILL — DULoxetine HCL 30 MG CPEP: 30 | 30 days supply | Qty: 30 | Fill #0

## 2015-11-30 MED FILL — PROAIR HFA 90 MCG INHALER: 108 (90 BAS | 30 days supply | Qty: 9 | Fill #3 | Status: TO

## 2015-11-30 MED FILL — FUROSEMIDE 40 MG TABLET: 40 | 30 days supply | Qty: 30 | Fill #7

## 2015-11-30 MED FILL — ATORVASTATIN 40 MG TABLET: 40 | 30 days supply | Qty: 30 | Fill #7

## 2015-11-30 NOTE — Telephone Encounter (Signed)
Pt. Called requesting a refill on Tylenol # 3. Please f/u with pt.  °

## 2015-12-06 ENCOUNTER — Telehealth: Payer: Self-pay | Admitting: Internal Medicine

## 2015-12-06 NOTE — Telephone Encounter (Signed)
Patient relative called requesting a medication refill for Tylenol 3. PLease follow up.

## 2015-12-13 ENCOUNTER — Other Ambulatory Visit: Payer: Self-pay | Admitting: *Deleted

## 2015-12-13 NOTE — Telephone Encounter (Signed)
Request was approved on 12/05/15

## 2015-12-15 MED FILL — LISINOPRIL 40 MG TABLET: 40 | 30 days supply | Qty: 30 | Fill #8 | Status: TO

## 2015-12-15 MED FILL — AMLODIPINE BESYLATE 5 MG TA: 5 | 30 days supply | Qty: 30 | Fill #7 | Status: TO

## 2015-12-15 MED FILL — ALLOPURINOL 300 MG TABLET: 300 | 30 days supply | Qty: 30 | Fill #8 | Status: TO

## 2015-12-16 ENCOUNTER — Other Ambulatory Visit: Payer: Self-pay | Admitting: Internal Medicine

## 2015-12-16 MED FILL — METOPROLOL TARTRATE 50 MG T: 50 | 30 days supply | Qty: 60 | Fill #1 | Status: TO

## 2015-12-16 MED FILL — FAMOTIDINE 20 MG TABLET: 20 | 30 days supply | Qty: 30 | Fill #5 | Status: TO

## 2015-12-16 MED FILL — ACETAMINOPHEN/COD #3 TABLET: 300-30 | 15 days supply | Qty: 90 | Fill #0

## 2015-12-20 NOTE — Telephone Encounter (Signed)
Request was filled on 12/05/15

## 2016-03-29 ENCOUNTER — Ambulatory Visit: Payer: Medicaid Other | Attending: Internal Medicine | Admitting: Internal Medicine

## 2016-03-29 ENCOUNTER — Encounter: Payer: Self-pay | Admitting: Internal Medicine

## 2016-03-29 DIAGNOSIS — I509 Heart failure, unspecified: Secondary | ICD-10-CM | POA: Diagnosis not present

## 2016-03-29 DIAGNOSIS — E7800 Pure hypercholesterolemia, unspecified: Secondary | ICD-10-CM | POA: Diagnosis not present

## 2016-03-29 DIAGNOSIS — Z7982 Long term (current) use of aspirin: Secondary | ICD-10-CM | POA: Insufficient documentation

## 2016-03-29 DIAGNOSIS — I110 Hypertensive heart disease with heart failure: Secondary | ICD-10-CM | POA: Insufficient documentation

## 2016-03-29 DIAGNOSIS — I10 Essential (primary) hypertension: Secondary | ICD-10-CM

## 2016-03-29 DIAGNOSIS — K219 Gastro-esophageal reflux disease without esophagitis: Secondary | ICD-10-CM | POA: Diagnosis not present

## 2016-03-29 DIAGNOSIS — E669 Obesity, unspecified: Secondary | ICD-10-CM | POA: Diagnosis not present

## 2016-03-29 DIAGNOSIS — G894 Chronic pain syndrome: Secondary | ICD-10-CM | POA: Insufficient documentation

## 2016-03-29 DIAGNOSIS — M10072 Idiopathic gout, left ankle and foot: Secondary | ICD-10-CM | POA: Diagnosis not present

## 2016-03-29 DIAGNOSIS — F410 Panic disorder [episodic paroxysmal anxiety] without agoraphobia: Secondary | ICD-10-CM | POA: Diagnosis not present

## 2016-03-29 DIAGNOSIS — Z6837 Body mass index (BMI) 37.0-37.9, adult: Secondary | ICD-10-CM | POA: Insufficient documentation

## 2016-03-29 DIAGNOSIS — Z9109 Other allergy status, other than to drugs and biological substances: Secondary | ICD-10-CM | POA: Diagnosis not present

## 2016-03-29 DIAGNOSIS — G43909 Migraine, unspecified, not intractable, without status migrainosus: Secondary | ICD-10-CM | POA: Insufficient documentation

## 2016-03-29 DIAGNOSIS — Z885 Allergy status to narcotic agent status: Secondary | ICD-10-CM | POA: Diagnosis not present

## 2016-03-29 DIAGNOSIS — Z8619 Personal history of other infectious and parasitic diseases: Secondary | ICD-10-CM | POA: Diagnosis not present

## 2016-03-29 DIAGNOSIS — J449 Chronic obstructive pulmonary disease, unspecified: Secondary | ICD-10-CM | POA: Insufficient documentation

## 2016-03-29 DIAGNOSIS — M1990 Unspecified osteoarthritis, unspecified site: Secondary | ICD-10-CM | POA: Insufficient documentation

## 2016-03-29 DIAGNOSIS — F411 Generalized anxiety disorder: Secondary | ICD-10-CM | POA: Insufficient documentation

## 2016-03-29 DIAGNOSIS — G4733 Obstructive sleep apnea (adult) (pediatric): Secondary | ICD-10-CM | POA: Diagnosis not present

## 2016-03-29 DIAGNOSIS — I5031 Acute diastolic (congestive) heart failure: Secondary | ICD-10-CM

## 2016-03-29 MED ORDER — COLCHICINE 0.6 MG PO TABS: 0.6000 mg | ORAL_TABLET | Freq: Every day | ORAL | 3 refills | Status: DC | PRN

## 2016-03-29 MED ORDER — GABAPENTIN 300 MG PO CAPS: 300.0000 mg | ORAL_CAPSULE | Freq: Three times a day (TID) | ORAL | 3 refills | Status: DC

## 2016-03-29 MED ORDER — LISINOPRIL 40 MG PO TABS: 40.0000 mg | ORAL_TABLET | Freq: Every day | ORAL | 3 refills | Status: DC

## 2016-03-29 MED ORDER — METOPROLOL TARTRATE 50 MG PO TABS: 50.0000 mg | ORAL_TABLET | Freq: Two times a day (BID) | ORAL | 3 refills | Status: DC

## 2016-03-29 MED ORDER — ALLOPURINOL 300 MG PO TABS: 300.0000 mg | ORAL_TABLET | Freq: Every evening | ORAL | 3 refills | Status: DC

## 2016-03-29 MED ORDER — DULOXETINE HCL 30 MG PO CPEP: 30.0000 mg | ORAL_CAPSULE | Freq: Every day | ORAL | 3 refills | Status: DC

## 2016-03-29 MED ORDER — DIAZEPAM 5 MG PO TABS: 5.0000 mg | ORAL_TABLET | Freq: Every day | ORAL | 0 refills | Status: DC | PRN

## 2016-03-29 MED ORDER — AMLODIPINE BESYLATE 5 MG PO TABS: 5.0000 mg | ORAL_TABLET | Freq: Every day | ORAL | 3 refills | Status: DC

## 2016-03-29 MED ORDER — INDOMETHACIN 25 MG PO CAPS: 25.0000 mg | ORAL_CAPSULE | Freq: Three times a day (TID) | ORAL | 0 refills | Status: DC | PRN

## 2016-03-29 MED ORDER — ASPIRIN 325 MG PO TABS: 325.0000 mg | ORAL_TABLET | Freq: Every day | ORAL | 11 refills | Status: DC

## 2016-03-29 MED ORDER — FUROSEMIDE 40 MG PO TABS: 40.0000 mg | ORAL_TABLET | Freq: Every day | ORAL | 3 refills | Status: DC

## 2016-03-29 MED ORDER — FAMOTIDINE 20 MG PO TABS: 20.0000 mg | ORAL_TABLET | Freq: Every day | ORAL | 3 refills | Status: DC

## 2016-03-29 MED ORDER — HYDROXYZINE HCL 25 MG PO TABS: 25.0000 mg | ORAL_TABLET | Freq: Three times a day (TID) | ORAL | 3 refills | Status: DC | PRN

## 2016-03-29 MED ORDER — ATORVASTATIN CALCIUM 40 MG PO TABS: 40.0000 mg | ORAL_TABLET | Freq: Every evening | ORAL | 3 refills | Status: DC

## 2016-03-29 MED ORDER — ACETAMINOPHEN-CODEINE #3 300-30 MG PO TABS: 1.0000 | ORAL_TABLET | Freq: Four times a day (QID) | ORAL | 0 refills | Status: DC | PRN

## 2016-03-29 MED ORDER — ALBUTEROL SULFATE HFA 108 (90 BASE) MCG/ACT IN AERS: 1.0000 | INHALATION_SPRAY | Freq: Four times a day (QID) | RESPIRATORY_TRACT | 3 refills | Status: DC | PRN

## 2016-03-29 MED ORDER — AZITHROMYCIN 250 MG PO TABS: ORAL_TABLET | ORAL | 0 refills | Status: DC

## 2016-03-29 NOTE — Progress Notes (Signed)
Preston [name], is a 45 y.o. male  [id number]  [id number]  DOB - 17-Aug-1970  Chief Complaint  Patient presents with  . Pain      Subjective:   Preston [name] is a 45 y.o. male with history of hypertension, generalized anxiety disorder and panic attacks, gout chronic bronchitis and chronic low back pain here today for a follow up visit and for medication refill. He was seen here previously for his severe low back pain and right hip pain since his fall in 2015, at that time MRI was ordered, initially declined by his insurance company later approved but patient was not able to make it to the appointment. He is here today to reschedule the MRI which is still much needed to rule out any nondisplaced fracture or any other pathology that is causing this severe pain. According to patient and his wife who is present with him today, he is not able to function well, constantly in pain preventing him from good night sleep. He is not able to enjoy any independence with activities of daily living. All of these is causing him significant anxiety and depression, but he denies any suicidal ideations or thoughts. He continues to smoke heavily. He needs refills on all his medications. Patient has No headache, No chest pain, No abdominal pain - No Nausea, No new weakness tingling or numbness, No Cough - SOB.  ALLERGIES: Allergies  Allergen Reactions  . Adhesive [Tape] Itching and Rash    Please use "paper" tape.  . Morphine And Related Itching, Nausea And Vomiting and Other (See Comments)    "bad feeling"   PAST MEDICAL HISTORY: Past Medical History:  Diagnosis Date  . Alcohol abuse   . Anxiety   . Arthritis    "hips, knees, back" (03/11/2015)  . Asthma    reports related to anxiety  . CHF (congestive heart failure) ([place])   . Chronic bronchitis ([place])   . Chronic hip pain    "both"  . Chronic lower back pain   . COPD (chronic obstructive pulmonary disease) ([place])   . Diverticulitis   . GERD  (gastroesophageal reflux disease)   . Gout   . History of Clostridium difficile infection   . Hypercholesterolemia   . Hypertension    Does not see a cardiologist  . Migraine    "new onset" (03/11/2015)  . Panic attacks   . Pneumonia     MEDICATIONS AT HOME: Prior to Admission medications   Medication Sig Start Date End Date Taking? Authorizing Provider  acetaminophen-codeine (TYLENOL #3) 300-30 MG tablet Take 1 tablet by mouth every 6 (six) hours as needed for moderate pain. 03/29/16  Yes Tresa Garter, MD  albuterol (PROVENTIL HFA;VENTOLIN HFA) 108 (90 Base) MCG/ACT inhaler Inhale 1-2 puffs into the lungs every 6 (six) hours as needed for wheezing or shortness of breath. 03/29/16  Yes Tresa Garter, MD  allopurinol (ZYLOPRIM) 300 MG tablet Take 1 tablet (300 mg total) by mouth every evening. 03/29/16  Yes Napoleon Monacelli E Doreene Burke, MD  amLODipine (NORVASC) 5 MG tablet Take 1 tablet (5 mg total) by mouth daily. 03/29/16  Yes Tresa Garter, MD  aspirin 325 MG tablet Take 1 tablet (325 mg total) by mouth daily. 03/29/16  Yes Tresa Garter, MD  atorvastatin (LIPITOR) 40 MG tablet Take 1 tablet (40 mg total) by mouth every evening. 03/29/16  Yes Tresa Garter, MD  colchicine 0.6 MG tablet Take 1 tablet (0.6 mg total) by mouth daily as  needed. 03/29/16  Yes Lael Wetherbee Essie Christine, MD  diazepam (VALIUM) 5 MG tablet Take 1 tablet (5 mg total) by mouth daily as needed for anxiety. 03/29/16  Yes Tresa Garter, MD  DULoxetine (CYMBALTA) 30 MG capsule Take 1 capsule (30 mg total) by mouth daily. 03/29/16  Yes Tresa Garter, MD  famotidine (PEPCID) 20 MG tablet Take 1 tablet (20 mg total) by mouth daily. 03/29/16  Yes Tresa Garter, MD  furosemide (LASIX) 40 MG tablet Take 1 tablet (40 mg total) by mouth daily. 03/29/16  Yes Tresa Garter, MD  gabapentin (NEURONTIN) 300 MG capsule Take 1 capsule (300 mg total) by mouth 3 (three) times daily. 03/29/16  Yes Tresa Garter, MD  hydrOXYzine (ATARAX/VISTARIL) 25 MG tablet Take 1 tablet (25 mg total) by mouth 3 (three) times daily as needed for anxiety. 03/29/16  Yes Tresa Garter, MD  indomethacin (INDOCIN) 25 MG capsule Take 1 capsule (25 mg total) by mouth 3 (three) times daily as needed. For gout flare up. 03/29/16  Yes Tresa Garter, MD  lisinopril (PRINIVIL,ZESTRIL) 40 MG tablet Take 1 tablet (40 mg total) by mouth daily. 03/29/16  Yes Tresa Garter, MD  metoprolol (LOPRESSOR) 50 MG tablet Take 1 tablet (50 mg total) by mouth 2 (two) times daily. 03/29/16  Yes Tresa Garter, MD  Nutritional Supplements (COLD AND FLU PO) Take 2 tablets by mouth 2 (two) times daily.   Yes Historical Provider, MD  azithromycin (ZITHROMAX) 250 MG tablet Take 2 tabs today and then one tablet daily for 4 days from tomorrow 03/29/16   Tresa Garter, MD  nicotine polacrilex (COMMIT) 4 MG lozenge Use 1 lozenge as needed every 1-2 hours for 6 weeks, then 1 lozenge every 2-4 hours for 3 weeks, then 1 lozenge every 4-8 hours for 3 weeks Patient not taking: Reported on 03/29/2016 10/13/15   Tresa Garter, MD    Objective:   Vitals:   03/29/16 0925  BP: 123/83  Pulse: 73  Resp: 20  Temp: 98.6 F (37 C)  TempSrc: Oral  SpO2: 97%  Weight: 293 lb 9.6 oz (133.2 kg)  Height: 6' 2.5" (1.892 m)    Exam General appearance : Awake, alert, not in any distress. Speech Clear. Not toxic looking, morbidly obese HEENT: Atraumatic and Normocephalic, pupils equally reactive to light and accomodation Neck: Supple, no JVD. No cervical lymphadenopathy.  Chest: Good air entry bilaterally, no added sounds  CVS: S1 S2 regular, no murmurs.  Abdomen: Bowel sounds present, Non tender and not distended with no gaurding, rigidity or rebound. Extremities: B/L Lower Ext shows no edema, both legs are warm to touch Neurology: Awake alert, and oriented X 3, CN II-XII intact, Non focal Skin: No Rash  Data Review Lab  Results  Component Value Date   HGBA1C 5.6 03/12/2015   HGBA1C 5.6 08/05/2013     Assessment & Plan   1. Essential hypertension, benign Refill - metoprolol (LOPRESSOR) 50 MG tablet; Take 1 tablet (50 mg total) by mouth 2 (two) times daily.  Dispense: 180 tablet; Refill: 3 - lisinopril (PRINIVIL,ZESTRIL) 40 MG tablet; Take 1 tablet (40 mg total) by mouth daily.  Dispense: 90 tablet; Refill: 3 - aspirin 325 MG tablet; Take 1 tablet (325 mg total) by mouth daily.  Dispense: 30 tablet; Refill: 11 - amLODipine (NORVASC) 5 MG tablet; Take 1 tablet (5 mg total) by mouth daily.  Dispense: 90 tablet; Refill: 3  We have discussed target  BP range and blood pressure goal. I have advised patient to check BP regularly and to call us back or report to clinic if the numbers are consistently higher than 140/90. We discussed the importance of compliance with medical therapy and DASH diet recommended, consequences of uncontrolled hypertension discussed.  - continue current BP medications  2. Idiopathic gout of left ankle, unspecified chronicity Refill - indomethacin (INDOCIN) 25 MG capsule; Take 1 capsule (25 mg total) by mouth 3 (three) times daily as needed. For gout flare up.  Dispense: 60 capsule; Refill: 0 - colchicine 0.6 MG tablet; Take 1 tablet (0.6 mg total) by mouth daily as needed.  Dispense: 30 tablet; Refill: 3\  We have discussed target BP range and blood pressure goal. I have advised patient to check BP regularly and to call us back or report to clinic if the numbers are consistently higher than 140/90. We discussed the importance of compliance with medical therapy and DASH diet recommended, consequences of uncontrolled hypertension discussed.  - continue current BP medications  3. Generalized anxiety disorder  - hydrOXYzine (ATARAX/VISTARIL) 25 MG tablet; Take 1 tablet (25 mg total) by mouth 3 (three) times daily as needed for anxiety.  Dispense: 90 tablet; Refill: 3 - DULoxetine  (CYMBALTA) 30 MG capsule; Take 1 capsule (30 mg total) by mouth daily.  Dispense: 90 capsule; Refill: 3 - diazepam (VALIUM) 5 MG tablet; Take 1 tablet (5 mg total) by mouth daily as needed for anxiety.  Dispense: 30 tablet; Refill: 0  4. Chronic pain syndrome  - gabapentin (NEURONTIN) 300 MG capsule; Take 1 capsule (300 mg total) by mouth 3 (three) times daily.  Dispense: 270 capsule; Refill: 3 - DULoxetine (CYMBALTA) 30 MG capsule; Take 1 capsule (30 mg total) by mouth daily.  Dispense: 90 capsule; Refill: 3 - acetaminophen-codeine (TYLENOL #3) 300-30 MG tablet; Take 1 tablet by mouth every 6 (six) hours as needed for moderate pain.  Dispense: 90 tablet; Refill: 0 Reorder - MR Hip Right Wo Contrast; Future - AMB referral to sports medicine - MR Lumbar Spine Wo Contrast; Future  5. Acute diastolic congestive heart failure (HCC) Refill - furosemide (LASIX) 40 MG tablet; Take 1 tablet (40 mg total) by mouth daily.  Dispense: 90 tablet; Refill: 3  6. Gastroesophageal reflux disease without esophagitis Refill - famotidine (PEPCID) 20 MG tablet; Take 1 tablet (20 mg total) by mouth daily.  Dispense: 90 tablet; Refill: 3  7. Morbid obesity due to excess calories (HCC) Refill - atorvastatin (LIPITOR) 40 MG tablet; Take 1 tablet (40 mg total) by mouth every evening.  Dispense: 90 tablet; Refill: 3  8. OSA (obstructive sleep apnea) Refill - albuterol (PROVENTIL HFA;VENTOLIN HFA) 108 (90 Base) MCG/ACT inhaler; Inhale 1-2 puffs into the lungs every 6 (six) hours as needed for wheezing or shortness of breath.  Dispense: 3 Inhaler; Refill: 3  Patient have been counseled extensively about nutrition and exercise  Return in about 6 months (around 09/26/2016) for Follow up Pain and comorbidities, Generalized Anxiety Disorder.  The patient was given clear instructions to go to ER or return to medical center if symptoms don't improve, worsen or new problems develop. The patient verbalized understanding.  The patient was told to call to get lab results if they haven't heard anything in the next week.   This note has been created with Surveyor, quantity. Any transcriptional errors are unintentional.    Genora Arp, MD, MHA, FACP, FAAP, [place]  and [place], [place]   03/29/2016, 10:29 AM

## 2016-03-29 NOTE — Progress Notes (Signed)
Patient is here for PAIN FU  Patient complains of bilateral hip and foot pain being present.  Patient had a syncope episode in April which caused him to bust his head and face.  Patient has taken medication today and patient has not eaten.

## 2016-04-03 ENCOUNTER — Encounter: Payer: Self-pay | Admitting: Family Medicine

## 2016-04-03 ENCOUNTER — Ambulatory Visit (INDEPENDENT_AMBULATORY_CARE_PROVIDER_SITE_OTHER): Payer: Medicaid Other | Admitting: Family Medicine

## 2016-04-03 VITALS — BP 132/97 | HR 78 | Ht 74.0 in | Wt 293.0 lb

## 2016-04-03 DIAGNOSIS — M1611 Unilateral primary osteoarthritis, right hip: Secondary | ICD-10-CM

## 2016-04-03 NOTE — Assessment & Plan Note (Signed)
Imaging from 2 years ago was showing significant arthritis on the right side and moderate arthritis on the left. Most likely most of his symptoms are coming from these places. - Referral to orthopedics for consideration of replacement. - Encouraged him in the meantime to stop smoking and to try to lose weight. - Advised to hold off on getting the MRI of his back and hip until he sees the orthopedist.

## 2016-04-03 NOTE — Progress Notes (Signed)
  [place] - 45 y.o. male MRN [id number]  Date of birth: [birth year]  SUBJECTIVE:  Including CC & ROS.  Chief Complaint  Patient presents with  . Hip Pain     Preston [name] is a 45 year old male that is presenting with bilateral hip pain but right greater than left. The pain is acute on chronic in nature. 2 years ago he fell and had a fracture of his femur. He has had other imaging that showed degenerative changes within his knee. He reports the pain is becoming worse and having severe impact on his daily quality of life. He has trouble getting off the toilet and sleeping at night. He has limited range of motion with movement. He denies any symptoms going down his leg.  ROS: No unexpected weight loss, fever, chills, redness, otherwise see HPI    HISTORY: Past Medical, Surgical, Social, and Family History Reviewed & Updated per EMR.   Pertinent Historical Findings include: PMSHx -  hyperlipidemia, COPD, gout, morbid obesity, diastolic heart failure, hypertension PSHx -  current tobacco user, Fhx: Reports a family history of arthritis. Medications -allopurinol, amlodipine, Lipitor, Neurontin, lisinopril, Lopressor  DATA REVIEWED: 03/29/2014: Lumbar x-ray: 1. No lumbar spine fracture or acute subluxation observed. 2. Mild thoracolumbar spondylosis 03/29/14: right hip x-ray: Subtle linear lucencies in the proximal right femur. Although not a definite finding, these changes do raise the question of a nondisplaced fracture of the proximal femur. MRI would be the study of choice to further evaluate. 04/18/13: left hip x-ray: Moderate symmetric osteoarthritic change in both hip joints. No fracture or dislocation. Stable appearance compared to prior study.  PHYSICAL EXAM:  VS: BP:(!) 132/97  HR:78bpm  TEMP: ( )  RESP:   HT:6\' 2"  (188 cm)   WT:293 lb (132.9 kg)  BMI:37.7 PHYSICAL EXAM: Gen: NAD, alert, cooperative with exam,  HEENT: clear conjunctiva, EOMI, poor dentition CV:  no edema,  capillary refill brisk,  Resp: non-labored, normal speech Skin: no rashes, normal turgor  Neuro: no gross deficits.  Psych:  alert and oriented Hip:  Tender to palpation in the right buttock, and right paraspinal muscles. No tenderness to palpation over the lumbar spinal processes. No tenderness to palpation of the greater trochanter bilaterally. Significant pain with internal rotation on the right. Pain with internal rotation on the left as well. Unable with internal rotation to rotate in. Internal rotation on the left to 0 Limited external rotation bilaterally. Strength is equal and bilateral extremities. +1 deep tendon reflexes in patellar and Achilles tendons. Normal plantar and dorsiflexion. Normal sensation. Normal pulses. Normal hip flexion. Negative straight leg raise bilaterally.  ASSESSMENT & PLAN:   Osteoarthritis of right hip Imaging from 2 years ago was showing significant arthritis on the right side and moderate arthritis on the left. Most likely most of his symptoms are coming from these places. - Referral to orthopedics for consideration of replacement. - Encouraged him in the meantime to stop smoking and to try to lose weight. - Advised to hold off on getting the MRI of his back and hip until he sees the orthopedist.

## 2016-04-05 ENCOUNTER — Telehealth: Payer: Self-pay | Admitting: Internal Medicine

## 2016-04-06 NOTE — Telephone Encounter (Signed)
Patient verified DOB Patient is not going to complete the MRI due to advice received from sports medicine referral. Patient was referred to Orthopaedic Surgeon and patient will be scheduled for hip replacement in November. Patient needs PCP clearance and Pulmonology clearance.  No further questions at this time.

## 2016-04-09 ENCOUNTER — Telehealth: Payer: Self-pay | Admitting: Internal Medicine

## 2016-04-09 MED ORDER — NICOTINE POLACRILEX 4 MG MT LOZG: LOZENGE | OROMUCOSAL | 2 refills | Status: DC

## 2016-04-09 NOTE — Telephone Encounter (Signed)
Requested medication sent to [facility] 

## 2016-04-09 NOTE — Telephone Encounter (Signed)
Pt. Called requesting a refill on nicotine polacrilex (COMMIT) 4 MG lozenge Pt. Uses [facility] pharmacy on [address].  Please f/u

## 2016-04-18 ENCOUNTER — Other Ambulatory Visit: Payer: Self-pay | Admitting: Internal Medicine

## 2016-04-18 DIAGNOSIS — I10 Essential (primary) hypertension: Secondary | ICD-10-CM

## 2016-04-26 ENCOUNTER — Other Ambulatory Visit: Payer: Self-pay

## 2016-04-26 ENCOUNTER — Encounter: Payer: Self-pay | Admitting: Internal Medicine

## 2016-04-26 ENCOUNTER — Ambulatory Visit: Payer: Medicaid Other | Attending: Internal Medicine | Admitting: Internal Medicine

## 2016-04-26 VITALS — BP 131/86 | HR 82 | Temp 98.9°F | Resp 17 | Ht 74.0 in | Wt 297.4 lb

## 2016-04-26 DIAGNOSIS — F17200 Nicotine dependence, unspecified, uncomplicated: Secondary | ICD-10-CM

## 2016-04-26 DIAGNOSIS — Z01810 Encounter for preprocedural cardiovascular examination: Secondary | ICD-10-CM

## 2016-04-26 DIAGNOSIS — I10 Essential (primary) hypertension: Secondary | ICD-10-CM | POA: Diagnosis not present

## 2016-04-26 DIAGNOSIS — F411 Generalized anxiety disorder: Secondary | ICD-10-CM | POA: Diagnosis not present

## 2016-04-26 DIAGNOSIS — Z79899 Other long term (current) drug therapy: Secondary | ICD-10-CM | POA: Diagnosis not present

## 2016-04-26 LAB — CBC WITH DIFFERENTIAL/PLATELET
Basophils Absolute: 0 cells/uL (ref 0–200)
Basophils Relative: 0 %
EOS PCT: 3 %
Eosinophils Absolute: 315 cells/uL (ref 15–500)
HCT: 41.6 % (ref 38.5–50.0)
Hemoglobin: 14 g/dL (ref 13.2–17.1)
LYMPHS ABS: 2205 {cells}/uL (ref 850–3900)
LYMPHS PCT: 21 %
MCH: 30.3 pg (ref 27.0–33.0)
MCHC: 33.7 g/dL (ref 32.0–36.0)
MCV: 90 fL (ref 80.0–100.0)
MPV: 10 fL (ref 7.5–12.5)
Monocytes Absolute: 630 cells/uL (ref 200–950)
Monocytes Relative: 6 %
NEUTROS PCT: 70 %
Neutro Abs: 7350 cells/uL (ref 1500–7800)
PLATELETS: 309 10*3/uL (ref 140–400)
RBC: 4.62 MIL/uL (ref 4.20–5.80)
RDW: 13.4 % (ref 11.0–15.0)
WBC: 10.5 10*3/uL (ref 3.8–10.8)

## 2016-04-26 LAB — COMPLETE METABOLIC PANEL WITH GFR
ALBUMIN: 4.8 g/dL (ref 3.6–5.1)
ALK PHOS: 77 U/L (ref 40–115)
ALT: 50 U/L — AB (ref 9–46)
AST: 27 U/L (ref 10–40)
BILIRUBIN TOTAL: 0.4 mg/dL (ref 0.2–1.2)
BUN: 13 mg/dL (ref 7–25)
CALCIUM: 9.9 mg/dL (ref 8.6–10.3)
CO2: 24 mmol/L (ref 20–31)
CREATININE: 0.77 mg/dL (ref 0.60–1.35)
Chloride: 104 mmol/L (ref 98–110)
GFR, Est African American: >89 mL/min (ref >=60)
GFR, Est Non African American: >89 mL/min (ref >=60)
Glucose, Bld: 122 mg/dL — ABNORMAL HIGH (ref 65–99)
Potassium: 4.3 mmol/L (ref 3.5–5.3)
Sodium: 139 mmol/L (ref 135–146)
TOTAL PROTEIN: 7.2 g/dL (ref 6.1–8.1)

## 2016-04-26 LAB — LIPID PANEL
Cholesterol: 151 mg/dL (ref 125–200)
HDL: 33 mg/dL — ABNORMAL LOW (ref >=40)
LDL CALC: 49 mg/dL (ref <130)
TRIGLYCERIDES: 347 mg/dL — AB (ref <150)
Total CHOL/HDL Ratio: 4.6 Ratio (ref <=5.0)
VLDL: 69 mg/dL — ABNORMAL HIGH (ref <30)

## 2016-04-26 NOTE — Progress Notes (Signed)
Preston [name], is a 45 y.o. male  [id number]  [id number]  DOB - [birth year]  Chief Complaint  Patient presents with  . Procedure        Subjective:   Preston [name] is a 45 y.o. male with history of hypertension, generalized anxiety disorder and panic attacks, gout chronic bronchitis and chronic low back pain here today for pre-procedure examination and medical clearance. He is being scheduled for TOTAL HIP ARTHROPLASTY ANTERIOR APPROACH for chronic bilateral hip pain  R > L. He needs medical clearance for this procedure. Patient is complaint with his medications, but continues to smoke heavily. He has no new complaint. BP is controlled. He denies SOB. Patient has No headache, No chest pain, No abdominal pain - No Nausea, No new weakness tingling or numbness.  No problems updated.  ALLERGIES: Allergies  Allergen Reactions  . Adhesive [Tape] Itching and Rash    Please use "paper" tape.  . Morphine And Related Itching, Nausea And Vomiting and Other (See Comments)    "bad feeling"    PAST MEDICAL HISTORY: Past Medical History:  Diagnosis Date  . Alcohol abuse   . Anxiety   . Arthritis    "hips, knees, back" (03/11/2015)  . Asthma    reports related to anxiety  . CHF (congestive heart failure) ([place])   . Chronic bronchitis ([place])   . Chronic hip pain    "both"  . Chronic lower back pain   . COPD (chronic obstructive pulmonary disease) ([place])   . Diverticulitis   . GERD (gastroesophageal reflux disease)   . Gout   . History of Clostridium difficile infection   . Hypercholesterolemia   . Hypertension    Does not see a cardiologist  . Migraine    "new onset" (03/11/2015)  . Panic attacks   . Pneumonia     MEDICATIONS AT HOME: Prior to Admission medications   Medication Sig Start Date End Date Taking? Authorizing Provider  acetaminophen-codeine (TYLENOL #3) 300-30 MG tablet Take 1 tablet by mouth every 6 (six) hours as needed for moderate pain. 03/29/16  Yes  Preston Garter, MD  albuterol (PROVENTIL HFA;VENTOLIN HFA) 108 (90 Base) MCG/ACT inhaler Inhale 1-2 puffs into the lungs every 6 (six) hours as needed for wheezing or shortness of breath. 03/29/16  Yes Preston Garter, MD  allopurinol (ZYLOPRIM) 300 MG tablet Take 1 tablet (300 mg total) by mouth every evening. 03/29/16  Yes Preston [name] E Doreene Burke, MD  amLODipine (NORVASC) 5 MG tablet Take 1 tablet (5 mg total) by mouth daily. 03/29/16  Yes Preston Garter, MD  aspirin 325 MG tablet Take 1 tablet (325 mg total) by mouth daily. 03/29/16  Yes Preston Garter, MD  atorvastatin (LIPITOR) 40 MG tablet Take 1 tablet (40 mg total) by mouth every evening. 03/29/16  Yes Preston Garter, MD  colchicine 0.6 MG tablet Take 1 tablet (0.6 mg total) by mouth daily as needed. 03/29/16  Yes Preston [name] Preston Christine, MD  diazepam (VALIUM) 5 MG tablet Take 1 tablet (5 mg total) by mouth daily as needed for anxiety. 03/29/16  Yes Preston Garter, MD  DULoxetine (CYMBALTA) 30 MG capsule Take 1 capsule (30 mg total) by mouth daily. 03/29/16  Yes Preston Garter, MD  famotidine (PEPCID) 20 MG tablet Take 1 tablet (20 mg total) by mouth daily. 03/29/16  Yes Preston Garter, MD  furosemide (LASIX) 40 MG tablet Take 1 tablet (40 mg total) by mouth daily. 03/29/16  Yes Preston [name]  Preston Christine, MD  gabapentin (NEURONTIN) 300 MG capsule Take 1 capsule (300 mg total) by mouth 3 (three) times daily. 03/29/16  Yes Preston Garter, MD  hydrOXYzine (ATARAX/VISTARIL) 25 MG tablet Take 1 tablet (25 mg total) by mouth 3 (three) times daily as needed for anxiety. 03/29/16  Yes Preston Garter, MD  indomethacin (INDOCIN) 25 MG capsule Take 1 capsule (25 mg total) by mouth 3 (three) times daily as needed. For gout flare up. 03/29/16  Yes Preston Garter, MD  lisinopril (PRINIVIL,ZESTRIL) 40 MG tablet Take 1 tablet (40 mg total) by mouth daily. 03/29/16  Yes Preston Garter, MD  metoprolol (LOPRESSOR) 50 MG tablet Take  1 tablet (50 mg total) by mouth 2 (two) times daily. 03/29/16  Yes Preston Garter, MD  nicotine polacrilex (COMMIT) 4 MG lozenge Use 1 lozenge as needed every 1-2 hours for 6 weeks, then 1 lozenge every 2-4 hours for 3 weeks, then 1 lozenge every 4-8 hours for 3 weeks 04/09/16  Yes Preston Garter, MD  Nutritional Supplements (COLD AND FLU PO) Take 2 tablets by mouth 2 (two) times daily.   Yes Historical Provider, MD     Objective:   Vitals:   04/26/16 1540  BP: 131/86  Pulse: 82  Resp: 17  Temp: 98.9 F (37.2 C)  TempSrc: Oral  SpO2: 97%  Weight: 297 lb 6.4 oz (134.9 kg)  Height: 6\' 2"  (1.88 m)    Exam General appearance : Awake, alert, not in any distress. Speech Clear. Not toxic looking, obese, poor dentition HEENT: Atraumatic and Normocephalic, pupils equally reactive to light and accomodation Neck: Supple, no JVD. No cervical lymphadenopathy.  Chest: Good air entry bilaterally, no added sounds  CVS: S1 S2 regular, no murmurs.  Abdomen: Bowel sounds present, Non tender and not distended with no gaurding, rigidity or rebound. Extremities: B/L Lower Ext shows no edema, both legs are warm to touch Neurology: Awake alert, and oriented X 3, CN II-XII intact, Non focal Skin: No Rash  Data Review Lab Results  Component Value Date   HGBA1C 5.6 03/12/2015   HGBA1C 5.6 08/05/2013     Assessment & Plan   1. Essential hypertension, benign  We have discussed target BP range and blood pressure goal. I have advised patient to check BP regularly and to call us back or report to clinic if the numbers are consistently higher than 140/90. We discussed the importance of compliance with medical therapy and DASH diet recommended, consequences of uncontrolled hypertension discussed.  - continue current BP medications  2. Generalized anxiety disorder  - Continue current medication - Patient counseled.  3. Morbid obesity due to excess calories (HCC)  - Lipid panel  4.  Tobacco use disorder  Preston [name] was counseled on the dangers of tobacco use, and was advised to quit. Reviewed strategies to maximize success, including removing cigarettes and smoking materials from environment, stress management and support of family/friends.  5. Preop cardiovascular exam  - COMPLETE METABOLIC PANEL WITH GFR - Urinalysis, Complete - CBC with Differential/Platelet - ECHOCARDIOGRAM COMPLETE; Future - If ECHO and labs are normal, then there is no medical contraindication to the planned surgery.  Patient have been counseled extensively about nutrition and exercise  Return in about 3 months (around 07/26/2016) for Follow up HTN, Follow up Pain and comorbidities, Heart Failure and Hypertension.  The patient was given clear instructions to go to ER or return to medical center if symptoms don't improve, worsen or new problems  develop. The patient verbalized understanding. The patient was told to call to get lab results if they haven't heard anything in the next week.   This note has been created with Surveyor, quantity. Any transcriptional errors are unintentional.    Angelica Chessman, MD, [place], Karilyn Cota, [place] [facility] [place], [place]   04/26/2016, 4:35 PM

## 2016-04-26 NOTE — Progress Notes (Signed)
297.4 

## 2016-04-26 NOTE — Patient Instructions (Signed)
Smoking Cessation, Tips for Success If you are ready to quit smoking, congratulations! You have chosen to help yourself be healthier. Cigarettes bring nicotine, tar, carbon monoxide, and other irritants into your body. Your lungs, heart, and blood vessels will be able to work better without these poisons. There are many different ways to quit smoking. Nicotine gum, nicotine patches, a nicotine inhaler, or nicotine nasal spray can help with physical craving. Hypnosis, support groups, and medicines help break the habit of smoking. WHAT THINGS CAN I DO TO MAKE QUITTING EASIER?  Here are some tips to help you quit for good:  Pick a date when you will quit smoking completely. Tell all of your friends and family about your plan to quit on that date.  Do not try to slowly cut down on the number of cigarettes you are smoking. Pick a quit date and quit smoking completely starting on that day.  Throw away all cigarettes.   Clean and remove all ashtrays from your home, work, and car.  On a card, write down your reasons for quitting. Carry the card with you and read it when you get the urge to smoke.  Cleanse your body of nicotine. Drink enough water and fluids to keep your urine clear or pale yellow. Do this after quitting to flush the nicotine from your body.  Learn to predict your moods. Do not let a bad situation be your excuse to have a cigarette. Some situations in your life might tempt you into wanting a cigarette.  Never have "just one" cigarette. It leads to wanting another and another. Remind yourself of your decision to quit.  Change habits associated with smoking. If you smoked while driving or when feeling stressed, try other activities to replace smoking. Stand up when drinking your coffee. Brush your teeth after eating. Sit in a different chair when you read the paper. Avoid alcohol while trying to quit, and try to drink fewer caffeinated beverages. Alcohol and caffeine may urge you to  smoke.  Avoid foods and drinks that can trigger a desire to smoke, such as sugary or spicy foods and alcohol.  Ask people who smoke not to smoke around you.  Have something planned to do right after eating or having a cup of coffee. For example, plan to take a walk or exercise.  Try a relaxation exercise to calm you down and decrease your stress. Remember, you may be tense and nervous for the first 2 weeks after you quit, but this will pass.  Find new activities to keep your hands busy. Play with a pen, coin, or rubber band. Doodle or draw things on paper.  Brush your teeth right after eating. This will help cut down on the craving for the taste of tobacco after meals. You can also try mouthwash.   Use oral substitutes in place of cigarettes. Try using lemon drops, carrots, cinnamon sticks, or chewing gum. Keep them handy so they are available when you have the urge to smoke.  When you have the urge to smoke, try deep breathing.  Designate your home as a nonsmoking area.  If you are a heavy smoker, ask your health care provider about a prescription for nicotine chewing gum. It can ease your withdrawal from nicotine.  Reward yourself. Set aside the cigarette money you save and buy yourself something nice.  Look for support from others. Join a support group or smoking cessation program. Ask someone at home or at work to help you with your plan   to quit smoking.  Always ask yourself, "Do I need this cigarette or is this just a reflex?" Tell yourself, "Today, I choose not to smoke," or "I do not want to smoke." You are reminding yourself of your decision to quit.  Do not replace cigarette smoking with electronic cigarettes (commonly called e-cigarettes). The safety of e-cigarettes is unknown, and some may contain harmful chemicals.  If you relapse, do not give up! Plan ahead and think about what you will do the next time you get the urge to smoke. HOW WILL I FEEL WHEN I QUIT SMOKING? You  may have symptoms of withdrawal because your body is used to nicotine (the addictive substance in cigarettes). You may crave cigarettes, be irritable, feel very hungry, cough often, get headaches, or have difficulty concentrating. The withdrawal symptoms are only temporary. They are strongest when you first quit but will go away within 10-14 days. When withdrawal symptoms occur, stay in control. Think about your reasons for quitting. Remind yourself that these are signs that your body is healing and getting used to being without cigarettes. Remember that withdrawal symptoms are easier to treat than the major diseases that smoking can cause.  Even after the withdrawal is over, expect periodic urges to smoke. However, these cravings are generally short lived and will go away whether you smoke or not. Do not smoke! WHAT RESOURCES ARE AVAILABLE TO HELP ME QUIT SMOKING? Your health care provider can direct you to community resources or hospitals for support, which may include:  Group support.  Education.  Hypnosis.  Therapy.   This information is not intended to replace advice given to you by your health care provider. Make sure you discuss any questions you have with your health care provider.   Document Released: 04/13/2004 Document Revised: 08/06/2014 Document Reviewed: 01/01/2013 Elsevier Interactive Patient Education 2016 Elsevier Inc. Hypertension Hypertension, commonly called high blood pressure, is when the force of blood pumping through your arteries is too strong. Your arteries are the blood vessels that carry blood from your heart throughout your body. A blood pressure reading consists of a higher number over a lower number, such as 110/72. The higher number (systolic) is the pressure inside your arteries when your heart pumps. The lower number (diastolic) is the pressure inside your arteries when your heart relaxes. Ideally you want your blood pressure below 120/80. Hypertension forces  your heart to work harder to pump blood. Your arteries may become narrow or stiff. Having untreated or uncontrolled hypertension can cause heart attack, stroke, kidney disease, and other problems. RISK FACTORS Some risk factors for high blood pressure are controllable. Others are not.  Risk factors you cannot control include:   Race. You may be at higher risk if you are African American.  Age. Risk increases with age.  Gender. Men are at higher risk than women before age 45 years. After age 65, women are at higher risk than men. Risk factors you can control include:  Not getting enough exercise or physical activity.  Being overweight.  Getting too much fat, sugar, calories, or salt in your diet.  Drinking too much alcohol. SIGNS AND SYMPTOMS Hypertension does not usually cause signs or symptoms. Extremely high blood pressure (hypertensive crisis) may cause headache, anxiety, shortness of breath, and nosebleed. DIAGNOSIS To check if you have hypertension, your health care provider will measure your blood pressure while you are seated, with your arm held at the level of your heart. It should be measured at least twice using   the same arm. Certain conditions can cause a difference in blood pressure between your right and left arms. A blood pressure reading that is higher than normal on one occasion does not mean that you need treatment. If it is not clear whether you have high blood pressure, you may be asked to return on a different day to have your blood pressure checked again. Or, you may be asked to monitor your blood pressure at home for 1 or more weeks. TREATMENT Treating high blood pressure includes making lifestyle changes and possibly taking medicine. Living a healthy lifestyle can help lower high blood pressure. You may need to change some of your habits. Lifestyle changes may include:  Following the DASH diet. This diet is high in fruits, vegetables, and whole grains. It is low in  salt, red meat, and added sugars.  Keep your sodium intake below 2,300 mg per day.  Getting at least 30-45 minutes of aerobic exercise at least 4 times per week.  Losing weight if necessary.  Not smoking.  Limiting alcoholic beverages.  Learning ways to reduce stress. Your health care provider may prescribe medicine if lifestyle changes are not enough to get your blood pressure under control, and if one of the following is true:  You are 18-59 years of age and your systolic blood pressure is above 140.  You are 60 years of age or older, and your systolic blood pressure is above 150.  Your diastolic blood pressure is above 90.  You have diabetes, and your systolic blood pressure is over 140 or your diastolic blood pressure is over 90.  You have kidney disease and your blood pressure is above 140/90.  You have heart disease and your blood pressure is above 140/90. Your personal target blood pressure may vary depending on your medical conditions, your age, and other factors. HOME CARE INSTRUCTIONS  Have your blood pressure rechecked as directed by your health care provider.   Take medicines only as directed by your health care provider. Follow the directions carefully. Blood pressure medicines must be taken as prescribed. The medicine does not work as well when you skip doses. Skipping doses also puts you at risk for problems.  Do not smoke.   Monitor your blood pressure at home as directed by your health care provider. SEEK MEDICAL CARE IF:   You think you are having a reaction to medicines taken.  You have recurrent headaches or feel dizzy.  You have swelling in your ankles.  You have trouble with your vision. SEEK IMMEDIATE MEDICAL CARE IF:  You develop a severe headache or confusion.  You have unusual weakness, numbness, or feel faint.  You have severe chest or abdominal pain.  You vomit repeatedly.  You have trouble breathing. MAKE SURE YOU:   Understand  these instructions.  Will watch your condition.  Will get help right away if you are not doing well or get worse.   This information is not intended to replace advice given to you by your health care provider. Make sure you discuss any questions you have with your health care provider.   Document Released: 07/16/2005 Document Revised: 11/30/2014 Document Reviewed: 05/08/2013 Elsevier Interactive Patient Education 2016 Elsevier Inc.  

## 2016-04-27 LAB — URINALYSIS, COMPLETE
BILIRUBIN URINE: NEGATIVE
Bacteria, UA: NONE SEEN [HPF]
Casts: NONE SEEN [LPF]
Crystals: NONE SEEN [HPF]
Hgb urine dipstick: NEGATIVE
KETONES UR: NEGATIVE
Leukocytes, UA: NEGATIVE
NITRITE: NEGATIVE
PH: 5.5 (ref 5.0–8.0)
Protein, ur: NEGATIVE
RBC / HPF: NONE SEEN RBC/HPF (ref <=2)
SPECIFIC GRAVITY, URINE: 1.017 (ref 1.001–1.035)
Squamous Epithelial / LPF: NONE SEEN [HPF] (ref <=5)
WBC UA: NONE SEEN WBC/HPF (ref <=5)
Yeast: NONE SEEN [HPF]

## 2016-05-04 ENCOUNTER — Telehealth: Payer: Self-pay | Admitting: *Deleted

## 2016-05-04 NOTE — Telephone Encounter (Signed)
Patient verified DOB Patient is aware of lab results being mostly normal except for triglyceride cholesterol. Patient advised to continue with cholesterol medication and limit saturated fat and cholesterol intake. Patient also advised to increase fiber and exercise as tolerated. Patient is aware of another cholesterol medication being added if the levels do not become under control. Patient expressed understanding and had no further questions at this time.

## 2016-05-04 NOTE — Telephone Encounter (Signed)
-----   Message from Tresa Garter, MD sent at 05/03/2016  9:32 AM EDT ----- Labs are mostly normal except for triglyceride cholesterol. Continue the cholesterol medicine and also please limit saturated fat to no more than 7% of your calories, limit cholesterol to 200 mg/day, increase fiber and exercise as tolerated. If needed we may add another cholesterol lowering medication to your regimen.

## 2016-05-07 ENCOUNTER — Ambulatory Visit (HOSPITAL_COMMUNITY)
Admission: RE | Admit: 2016-05-07 | Discharge: 2016-05-07 | Disposition: A | Discharge status: Home / Self Care | Payer: Medicaid Other | Source: Ambulatory Visit | Attending: Internal Medicine | Admitting: Internal Medicine

## 2016-05-07 DIAGNOSIS — Z01810 Encounter for preprocedural cardiovascular examination: Secondary | ICD-10-CM | POA: Diagnosis not present

## 2016-05-07 DIAGNOSIS — I501 Left ventricular failure: Secondary | ICD-10-CM | POA: Diagnosis not present

## 2016-05-07 NOTE — Progress Notes (Signed)
Echocardiogram 2D Echocardiogram has been performed.  Aggie Cosier 05/07/2016, 3:29 PM

## 2016-05-09 ENCOUNTER — Other Ambulatory Visit: Payer: Self-pay | Admitting: Orthopedic Surgery

## 2016-05-13 ENCOUNTER — Encounter: Payer: Self-pay | Admitting: Internal Medicine

## 2016-05-15 ENCOUNTER — Telehealth: Payer: Self-pay | Admitting: *Deleted

## 2016-05-15 NOTE — Telephone Encounter (Signed)
Patient verified DOB Patients wife is aware of patients ECHO showing no medical contraindications for planned procedure. Wife expressed her understanding and will share with the husband upon his return. No further questions at this time.

## 2016-05-15 NOTE — Telephone Encounter (Signed)
-----   Message from Tresa Garter, MD sent at 05/13/2016  2:46 PM EDT ----- Please inform patient that his heart ultrasound is normal. He has no medical contraindication to the planned procedure.

## 2016-06-14 ENCOUNTER — Ambulatory Visit (HOSPITAL_COMMUNITY)
Admission: RE | Admit: 2016-06-14 | Discharge: 2016-06-14 | Disposition: A | Discharge status: Home / Self Care | Payer: Medicaid Other | Source: Ambulatory Visit | Attending: Orthopedic Surgery | Admitting: Orthopedic Surgery

## 2016-06-14 ENCOUNTER — Encounter (HOSPITAL_COMMUNITY)
Admission: RE | Admit: 2016-06-14 | Discharge: 2016-06-14 | Disposition: A | Discharge status: Home / Self Care | Payer: Medicaid Other | Source: Ambulatory Visit | Attending: Orthopedic Surgery | Admitting: Orthopedic Surgery

## 2016-06-14 ENCOUNTER — Encounter (HOSPITAL_COMMUNITY): Payer: Self-pay

## 2016-06-14 DIAGNOSIS — Z0183 Encounter for blood typing: Secondary | ICD-10-CM | POA: Insufficient documentation

## 2016-06-14 DIAGNOSIS — Z01818 Encounter for other preprocedural examination: Secondary | ICD-10-CM | POA: Insufficient documentation

## 2016-06-14 DIAGNOSIS — R918 Other nonspecific abnormal finding of lung field: Secondary | ICD-10-CM | POA: Insufficient documentation

## 2016-06-14 DIAGNOSIS — Z01812 Encounter for preprocedural laboratory examination: Secondary | ICD-10-CM | POA: Insufficient documentation

## 2016-06-14 DIAGNOSIS — M1612 Unilateral primary osteoarthritis, left hip: Secondary | ICD-10-CM | POA: Diagnosis not present

## 2016-06-14 LAB — CBC WITH DIFFERENTIAL/PLATELET
Basophils Absolute: 0 10*3/uL (ref 0.0–0.1)
Basophils Relative: 0 %
EOS ABS: 0.2 10*3/uL (ref 0.0–0.7)
EOS PCT: 2 %
HCT: 45.2 % (ref 39.0–52.0)
Hemoglobin: 15.2 g/dL (ref 13.0–17.0)
LYMPHS ABS: 2.5 10*3/uL (ref 0.7–4.0)
Lymphocytes Relative: 21 %
MCH: 30.7 pg (ref 26.0–34.0)
MCHC: 33.6 g/dL (ref 30.0–36.0)
MCV: 91.3 fL (ref 78.0–100.0)
MONOS PCT: 6 %
Monocytes Absolute: 0.7 10*3/uL (ref 0.1–1.0)
Neutro Abs: 8.2 10*3/uL — ABNORMAL HIGH (ref 1.7–7.7)
Neutrophils Relative %: 71 %
PLATELETS: 315 10*3/uL (ref 150–400)
RBC: 4.95 MIL/uL (ref 4.22–5.81)
RDW: 12.9 % (ref 11.5–15.5)
WBC: 11.6 10*3/uL — AB (ref 4.0–10.5)

## 2016-06-14 LAB — URINALYSIS, ROUTINE W REFLEX MICROSCOPIC
Bilirubin Urine: NEGATIVE
Glucose, UA: NEGATIVE mg/dL
Hgb urine dipstick: NEGATIVE
Ketones, ur: NEGATIVE mg/dL
LEUKOCYTES UA: NEGATIVE
NITRITE: NEGATIVE
PH: 6.5 (ref 5.0–8.0)
Protein, ur: NEGATIVE mg/dL
SPECIFIC GRAVITY, URINE: 1.01 (ref 1.005–1.030)

## 2016-06-14 LAB — BASIC METABOLIC PANEL
Anion gap: 14 (ref 5–15)
BUN: 6 mg/dL (ref 6–20)
CHLORIDE: 99 mmol/L — AB (ref 101–111)
CO2: 24 mmol/L (ref 22–32)
CREATININE: 0.72 mg/dL (ref 0.61–1.24)
Calcium: 10 mg/dL (ref 8.9–10.3)
GFR calc Af Amer: >60 mL/min (ref >60)
GFR calc non Af Amer: >60 mL/min (ref >60)
Glucose, Bld: 90 mg/dL (ref 65–99)
Potassium: 4.3 mmol/L (ref 3.5–5.1)
SODIUM: 137 mmol/L (ref 135–145)

## 2016-06-14 LAB — COOXEMETRY PANEL
CARBOXYHEMOGLOBIN: 1.4 % (ref 0.5–1.5)
Methemoglobin: 0.8 % (ref 0.0–1.5)
O2 SAT: 68.6 %
Total hemoglobin: 15.5 g/dL (ref 12.0–16.0)

## 2016-06-14 LAB — SURGICAL PCR SCREEN
MRSA, PCR: NEGATIVE
Staphylococcus aureus: POSITIVE — AB

## 2016-06-14 LAB — PROTIME-INR
INR: 1.01
PROTHROMBIN TIME: 13.3 s (ref 11.4–15.2)

## 2016-06-14 LAB — TYPE AND SCREEN
ABO/RH(D): A POS
Antibody Screen: NEGATIVE

## 2016-06-14 LAB — APTT: APTT: 30 s (ref 24–36)

## 2016-06-14 NOTE — Progress Notes (Signed)
Pt denies SOB, chest pain, and being under the care of a cardiologist. Pt denies having a stress test and cardiac cath. Pt denies having a chest x ray within the last year. Pt denies having recent labs.

## 2016-06-14 NOTE — Progress Notes (Signed)
   06/14/16 1437  OBSTRUCTIVE SLEEP APNEA  Have you ever been diagnosed with sleep apnea through a sleep study? No  Do you snore loudly (loud enough to be heard through closed doors)?  1  Do you often feel tired, fatigued, or sleepy during the daytime (such as falling asleep during driving or talking to someone)? 0  Has anyone observed you stop breathing during your sleep? 0  Do you have, or are you being treated for high blood pressure? 1  BMI more than 35 kg/m2? 1  Age > 50 (1-yes) 0  Neck circumference greater than:Male 16 inches or larger, Male 17inches or larger? 1  Male Gender (Yes=1) 1  Obstructive Sleep Apnea Score 5

## 2016-06-14 NOTE — Progress Notes (Signed)
Spoke with Juliann Pulse, Surgical Scheduler, to clarify pt pre-op Aspirin instructions; according to Juliann Pulse, MD advised that pt continue Aspirin and hold on DOS.Pt chart forwarded to anesthesia for review of cardiac clearance.

## 2016-06-14 NOTE — Pre-Procedure Instructions (Addendum)
[place]  06/14/2016      [place], [place] - [name] [place] [address] Phone: [phone] Fax: [phone]    Your procedure is scheduled on Monday, June 25, 2016  Report to [facility] Admitting at 10:00 A.M.  Call this number if you have problems the morning of surgery:  [phone]   Remember:  Do not eat food or drink liquids after midnight Sunday, June 24, 2016  Take these medicines the morning of surgery with A SIP OF WATER : famotidine (PEPCID), gabapentin (NEURONTIN), metoprolol (LOPRESSOR), if needed: diazepam (VALIUM) for anxiety, colchicine for gout flare up, Tylenol for pain, albuterol (PROVENTIL HFA;VENTOLIN HFA) inhaler ( bring in with you on day of surgery. Stop taking vitamins, fish oil, and herbal medications. Do not take any NSAIDs ie: Ibuprofen, Advil, Naproxen, BC and Goody Powder and indomethacin (INDOCIN) ; stop Monday, June 18, 2016.  Do not wear jewelry, make-up or nail polish.  Do not wear lotions, powders, or perfumes, or deoderant.  Do not shave 48 hours prior to surgery.  Men may shave face and neck.  Do not bring valuables to the hospital.  [facility] is not responsible for any belongings or valuables.  Contacts, dentures or bridgework may not be worn into surgery.  Leave your suitcase in the car.  After surgery it may be brought to your room.  For patients admitted to the hospital, discharge time will be determined by your treatment team.  Special instructions:  [facility] - Preparing for Surgery  Before surgery, you can play an important role.  Because skin is not sterile, your skin needs to be as free of germs as possible.  You can reduce the number of germs on you skin by washing with CHG (chlorahexidine gluconate) soap before surgery.  CHG is an antiseptic cleaner which kills germs and bonds with the skin to continue killing germs even after  washing.  Please DO NOT use if you have an allergy to CHG or antibacterial soaps.  If your skin becomes reddened/irritated stop using the CHG and inform your nurse when you arrive at Short Stay.  Do not shave (including legs and underarms) for at least 48 hours prior to the first CHG shower.  You may shave your face.  Please follow these instructions carefully:   1.  Shower with CHG Soap the night before surgery and the morning of Surgery.  2.  If you choose to wash your hair, wash your hair first as usual with your normal shampoo.  3.  After you shampoo, rinse your hair and body thoroughly to remove the Shampoo.  4.  Use CHG as you would any other liquid soap.  You can apply chg directly  to the skin and wash gently with scrungie or a clean washcloth.  5.  Apply the CHG Soap to your body ONLY FROM THE NECK DOWN.  Do not use on open wounds or open sores.  Avoid contact with your eyes, ears, mouth and genitals (private parts).  Wash genitals (private parts) with your normal soap.  6.  Wash thoroughly, paying special attention to the area where your surgery will be performed.  7.  Thoroughly rinse your body with warm water from the neck down.  8.  DO NOT shower/wash with your normal soap after using and rinsing off the CHG Soap.  9.  Pat yourself dry with a clean towel.  10.  Wear clean pajamas.            11.  Place clean sheets on your bed the night of your first shower and do not sleep with pets.  Day of Surgery  Do not apply any lotions/deoderants the morning of surgery.  Please wear clean clothes to the hospital/surgery center.  Please read over the following fact sheets that you were given. Pain Booklet, Coughing and Deep Breathing, Blood Transfusion Information, Total Joint Packet, MRSA Information and Surgical Site Infection Prevention

## 2016-06-15 NOTE — Progress Notes (Addendum)
Anesthesia Chart Review: Patient is a 45 year old male scheduled for left THA, anterior approach on 06/25/16 by Dr. Mayer Camel. Anesthesia type is posted for spinal.  History includes smoking, hypertension, generalized anxiety disorder and panic attacks, gout, COPD/chronic bronchitis, chronic low back pain, ETOH abuse (reports 3-4 beers/week), CHF (grade II diastolic dysfunction [id number]; grade I 05/07/16 echo), migraines, sigmoid diverticulitis with superimposed C. difficile colitis s/p laparoscopic LOA with splenic flexure takedown and low anterior colon resection 07/08/13, appendectomy, mandibular surgery. BMI is consistent with obesity. OSA screening score is 5.   PCP is Dr. Doreene Burke with [facility]. Following recent echo, he felt there was "no medical contraindication to the planned procedure."  BP (!) 140/91   Pulse 78   Temp 36.9 C   Resp 20   Ht 6\' 2"  (1.88 m)   Wt 293 lb 12.8 oz (133.3 kg)   SpO2 98%   BMI 37.72 kg/m   04/26/16 EKG: NSR, RSR prime or QR pattern in V1 suggests RV conduction delay. QT 386/QTc 450 ms. When compared to 9/09/07/15 tracing, QT has shortened.  05/07/16 Echo: Study Conclusions - Left ventricle: The cavity size was normal. Systolic function was   normal. The estimated ejection fraction was in the range of 55%   to 60%. Wall motion was normal; there were no regional wall   motion abnormalities. Doppler parameters are consistent with   abnormal left ventricular relaxation (grade 1 diastolic   dysfunction). Doppler parameters are consistent with   indeterminate ventricular filling pressure. - Aortic valve: Transvalvular velocity was within the normal range.   There was no stenosis. There was no regurgitation. - Mitral valve: Transvalvular velocity was within the normal range.   There was no evidence for stenosis. There was no regurgitation. - Right ventricle: The cavity size was normal. Wall thickness was   normal. Systolic  function was normal. - Tricuspid valve: There was no regurgitation.  06/14/16 CXR: FINDINGS: Heart is normal in size. There is prominent mediastinal density which may be related to mediastinal fat and normal mediastinal structures. However additionally there is apparent narrowing of the supra carinal portion of the trachea. Further evaluation is recommended. The lungs are clear. No pulmonary edema or consolidations. IMPRESSION: Question of tracheal narrowing and mediastinal prominence. Further evaluation with CT is recommended. Intravenous contrast is recommended unless it is contraindicated.  10/13/15 PFTs: FVC 4.75 (80%), FEV1 3.58 (77%), DLCOunc 33.27 (88%).  Preoperative labs noted. Cr 0.72.   Discussed CXR findings with anesthesiologist Dr. Marcell Barlow. Agree that with question of tracheal narrowing and mediastinal prominence that he should get a CT scan with contrast for further evaluation prior to surgery. I have notified Dr. Damita Dunnings PA Randall Hiss. He will arrange for patient to have a CT scan.   George Hugh [facility] Short Stay Center/Anesthesiology Phone [phone] 06/15/2016 5:05 PM  Addendum: Chest CT done on 06/20/16: IMPRESSION: 1. No mediastinal or hilar mass or adenopathy is seen. 2. There is narrowing of the tracheal air shadow without extrinsic lesion, suggesting emphysema or less likely tracheomalacia. 3. No suspicious lung nodule or mass. 4. Suspect fatty infiltration of the liver.  Correlate with LFTs. (LFTs were normal to minimally elevated on 04/26/16; AST 27, ALT 50.)   Based on CT results and recent evaluation with medical clearance, I would anticipate that he can proceed as planned if no acute changes.  George Hugh [facility] Short Stay Center/Anesthesiology Phone [phone] 06/20/2016 4:55 PM

## 2016-06-15 NOTE — Progress Notes (Signed)
Pt chart left for anesthesia to review abnormal chest x ray.

## 2016-06-18 ENCOUNTER — Other Ambulatory Visit: Payer: Self-pay | Admitting: Orthopedic Surgery

## 2016-06-18 DIAGNOSIS — R9389 Abnormal findings on diagnostic imaging of other specified body structures: Secondary | ICD-10-CM

## 2016-06-20 ENCOUNTER — Ambulatory Visit (HOSPITAL_COMMUNITY)
Admission: RE | Admit: 2016-06-20 | Discharge: 2016-06-20 | Disposition: A | Discharge status: Home / Self Care | Payer: Medicaid Other | Source: Ambulatory Visit | Attending: Orthopedic Surgery | Admitting: Orthopedic Surgery

## 2016-06-20 DIAGNOSIS — R938 Abnormal findings on diagnostic imaging of other specified body structures: Secondary | ICD-10-CM | POA: Diagnosis present

## 2016-06-20 DIAGNOSIS — R9389 Abnormal findings on diagnostic imaging of other specified body structures: Secondary | ICD-10-CM

## 2016-06-20 MED ORDER — IOPAMIDOL (ISOVUE-300) INJECTION 61%
75.0000 mL | Freq: Once | INTRAVENOUS | Status: AC | PRN
  Administered 2016-06-20: 75 mL via INTRAVENOUS

## 2016-06-20 NOTE — H&P (Signed)
TOTAL HIP ADMISSION H&P  Patient is admitted for left total hip arthroplasty.  Subjective:  Chief Complaint: left hip pain  HPI: Preston [name], 45 y.o. male, has a history of pain and functional disability in the left hip(s) due to arthritis and patient has failed non-surgical conservative treatments for greater than 12 weeks to include NSAID's and/or analgesics, use of assistive devices and activity modification.  Onset of symptoms was gradual starting 1 years ago with gradually worsening course since that time.The patient noted no past surgery on the left hip(s).  Patient currently rates pain in the left hip at 10 out of 10 with activity. Patient has night pain, worsening of pain with activity and weight bearing, pain that interfers with activities of daily living and pain with passive range of motion. Patient has evidence of periarticular osteophytes and joint space narrowing by imaging studies. This condition presents safety issues increasing the risk of falls.    There is no current active infection.  Patient Active Problem List   Diagnosis Date Noted  . Osteoarthritis of right hip 04/03/2016  . Tobacco use disorder 10/13/2015  . Chronic hip pain 07/14/2015  . Gastroesophageal reflux disease without esophagitis 07/14/2015  . Essential hypertension, benign 03/24/2015  . Chronic diastolic congestive heart failure ([place]) 03/24/2015  . Morbid obesity ([place]) 03/24/2015  . Nicotine abuse 03/24/2015  . Generalized anxiety disorder 03/24/2015  . Primary gout 03/24/2015  . Chronic pain syndrome 03/24/2015  . COPD (chronic obstructive pulmonary disease) ([place])   . HLD (hyperlipidemia) 03/11/2015  . GERD (gastroesophageal reflux disease) 03/11/2015  . Depression 03/11/2015  . SOB (shortness of breath) 03/11/2015  . Bilateral leg edema 03/11/2015  . Alcohol abuse 03/11/2015  . Elevated troponin 03/11/2015  . Diverticulitis large intestine 07/08/2013  . Personal history of colonic polyps  03/10/2013  . Diverticulitis 02/26/2013  . Diverticulosis 02/03/2013  . Tobacco abuse 12/30/2012  . Gout    Past Medical History:  Diagnosis Date  . Alcohol abuse   . Anxiety   . Arthritis    "hips, knees, back" (03/11/2015)  . Asthma    reports related to anxiety  . CHF (congestive heart failure) ([place])   . Chronic bronchitis ([place])   . Chronic hip pain    "both"  . Chronic lower back pain   . COPD (chronic obstructive pulmonary disease) ([place])   . Diverticulitis   . DJD (degenerative joint disease)   . GERD (gastroesophageal reflux disease)   . Gout   . History of Clostridium difficile infection   . Hypercholesterolemia   . Hypertension    Does not see a cardiologist  . Migraine    "new onset" (03/11/2015)  . Panic attacks   . Pneumonia     Past Surgical History:  Procedure Laterality Date  . APPENDECTOMY    . COLONOSCOPY WITH PROPOFOL N/A 04/01/2013   Procedure: COLONOSCOPY WITH PROPOFOL;  Surgeon: Arta Silence, MD;  Location: [facility] ENDOSCOPY;  Service: Endoscopy;  Laterality: N/A;  . FLEXIBLE SIGMOIDOSCOPY N/A 02/28/2013   Procedure: FLEXIBLE SIGMOIDOSCOPY;  Surgeon: Winfield Cunas., MD;  Location: [facility] ENDOSCOPY;  Service: Endoscopy;  Laterality: N/A;  . LAPAROSCOPIC LOW ANTERIOR RESECTION N/A 07/08/2013   Procedure: LAPAROSCOPIC LOW ANTERIOR RESECTION WITH TAKEDOWN OF SPLENIC FLEXURE.;  Surgeon: Adin Hector, MD;  Location: [place];  Service: General;  Laterality: N/A;  . MANDIBLE SURGERY Left 2010   "dry skin pocket cut out"    No prescriptions prior to admission.   Allergies  Allergen Reactions  . Adhesive [Tape] Itching and Rash    Please use "paper" tape.  . Morphine And Related Itching, Nausea And Vomiting and Other (See Comments)    "bad feeling"    Social History  Substance Use Topics  . Smoking status: Former Smoker    Packs/day: 1.00    Years: 30.00    Types: Cigarettes    Quit date: 06/11/2016  . Smokeless tobacco: Former Systems developer    Types: Chew      Comment: "quit chewing in the 1990's"  . Alcohol use Yes     Comment: 3/4 beers a week    Family History  Problem Relation Age of Onset  . Cancer Father     lymphoma  . Heart attack Mother      Review of Systems  Constitutional: Positive for diaphoresis and malaise/fatigue.  HENT: Negative.   Eyes: Negative.   Respiratory: Positive for shortness of breath.   Cardiovascular: Positive for leg swelling.       HTN  Gastrointestinal: Negative.   Genitourinary: Negative.   Musculoskeletal: Positive for joint pain and myalgias.  Neurological: Positive for dizziness.  Endo/Heme/Allergies: Negative.   Psychiatric/Behavioral: The patient is nervous/anxious.     Objective:  Physical Exam  Constitutional: He is oriented to person, place, and time. He appears well-developed and well-nourished.  HENT:  Head: Normocephalic and atraumatic.  Eyes: Pupils are equal, round, and reactive to light.  Neck: Normal range of motion. Neck supple.  Cardiovascular: Intact distal pulses.   Respiratory: Effort normal.  Musculoskeletal: He exhibits tenderness.  patient has severe pain with any movement of his hips.  He has very limited motion with internal and external rotation.  He can only internally rotate to approximately 5.  Discomfort with flexion and extension of the left hip.  His calves are soft and nontender.  He is neurovascularly intact distally.  Neurological: He is alert and oriented to person, place, and time.  Skin: Skin is warm and dry.  Psychiatric: He has a normal mood and affect. His behavior is normal. Judgment and thought content normal.    Vital signs in last 24 hours:    Labs:   Estimated body mass index is 37.72 kg/m as calculated from the following:   Height as of 06/14/16: 6\' 2"  (1.88 m).   Weight as of 06/14/16: 133.3 kg (293 lb 12.8 oz).   Imaging Review Plain radiographs demonstrate AP of the pelvis and crosstable lateral of the right and left hips are taken  and reviewed in office today.  Today, the patient has osteoarthritis of bilateral hips with flattening of his femoral heads.  Assessment/Plan:  End stage arthritis, left hip(s)  The patient history, physical examination, clinical judgement of the provider and imaging studies are consistent with end stage degenerative joint disease of the left hip(s) and total hip arthroplasty is deemed medically necessary. The treatment options including medical management, injection therapy, arthroscopy and arthroplasty were discussed at length. The risks and benefits of total hip arthroplasty were presented and reviewed. The risks due to aseptic loosening, infection, stiffness, dislocation/subluxation,  thromboembolic complications and other imponderables were discussed.  The patient acknowledged the explanation, agreed to proceed with the plan and consent was signed. Patient is being admitted for inpatient treatment for surgery, pain control, PT, OT, prophylactic antibiotics, VTE prophylaxis, progressive ambulation and ADL's and discharge planning.The patient is planning to be discharged home with home health services

## 2016-06-22 MED ORDER — CEFAZOLIN SODIUM 10 G IJ SOLR
3.0000 g | INTRAMUSCULAR | Status: AC
  Administered 2016-06-25: 3 g via INTRAVENOUS
  Filled 2016-06-22: qty 3000

## 2016-06-24 DIAGNOSIS — M1612 Unilateral primary osteoarthritis, left hip: Secondary | ICD-10-CM | POA: Diagnosis present

## 2016-06-24 MED ORDER — TRANEXAMIC ACID 1000 MG/10ML IV SOLN
2000.0000 mg | Freq: Once | INTRAVENOUS | Status: DC
  Filled 2016-06-24 (×2): qty 20

## 2016-06-24 MED ORDER — TRANEXAMIC ACID 1000 MG/10ML IV SOLN
1000.0000 mg | INTRAVENOUS | Status: AC
  Administered 2016-06-25: 1000 mg via INTRAVENOUS
  Filled 2016-06-24 (×2): qty 10

## 2016-06-25 ENCOUNTER — Inpatient Hospital Stay (HOSPITAL_COMMUNITY): Payer: Medicaid Other | Admitting: Vascular Surgery

## 2016-06-25 ENCOUNTER — Encounter (HOSPITAL_COMMUNITY)
Admission: RE | Disposition: A | Discharge status: Home / Self Care | Payer: Self-pay | Source: Ambulatory Visit | Attending: Orthopedic Surgery

## 2016-06-25 ENCOUNTER — Inpatient Hospital Stay (HOSPITAL_COMMUNITY)
Admission: RE | Admit: 2016-06-25 | Discharge: 2016-06-27 | DRG: 470 | Disposition: A | Discharge status: Home / Self Care | Payer: Medicaid Other | Source: Ambulatory Visit | Attending: Orthopedic Surgery | Admitting: Orthopedic Surgery

## 2016-06-25 ENCOUNTER — Encounter (HOSPITAL_COMMUNITY): Payer: Self-pay | Admitting: Anesthesiology

## 2016-06-25 ENCOUNTER — Inpatient Hospital Stay (HOSPITAL_COMMUNITY): Payer: Medicaid Other

## 2016-06-25 DIAGNOSIS — Z419 Encounter for procedure for purposes other than remedying health state, unspecified: Secondary | ICD-10-CM

## 2016-06-25 DIAGNOSIS — F411 Generalized anxiety disorder: Secondary | ICD-10-CM | POA: Diagnosis present

## 2016-06-25 DIAGNOSIS — I110 Hypertensive heart disease with heart failure: Secondary | ICD-10-CM | POA: Diagnosis present

## 2016-06-25 DIAGNOSIS — M1000 Idiopathic gout, unspecified site: Secondary | ICD-10-CM | POA: Diagnosis present

## 2016-06-25 DIAGNOSIS — Z885 Allergy status to narcotic agent status: Secondary | ICD-10-CM

## 2016-06-25 DIAGNOSIS — Z8249 Family history of ischemic heart disease and other diseases of the circulatory system: Secondary | ICD-10-CM | POA: Diagnosis not present

## 2016-06-25 DIAGNOSIS — I5032 Chronic diastolic (congestive) heart failure: Secondary | ICD-10-CM | POA: Diagnosis present

## 2016-06-25 DIAGNOSIS — Z6837 Body mass index (BMI) 37.0-37.9, adult: Secondary | ICD-10-CM

## 2016-06-25 DIAGNOSIS — M1612 Unilateral primary osteoarthritis, left hip: Principal | ICD-10-CM | POA: Diagnosis present

## 2016-06-25 DIAGNOSIS — K219 Gastro-esophageal reflux disease without esophagitis: Secondary | ICD-10-CM | POA: Diagnosis present

## 2016-06-25 DIAGNOSIS — J449 Chronic obstructive pulmonary disease, unspecified: Secondary | ICD-10-CM | POA: Diagnosis present

## 2016-06-25 DIAGNOSIS — Z91048 Other nonmedicinal substance allergy status: Secondary | ICD-10-CM

## 2016-06-25 DIAGNOSIS — Z87891 Personal history of nicotine dependence: Secondary | ICD-10-CM | POA: Diagnosis not present

## 2016-06-25 DIAGNOSIS — E785 Hyperlipidemia, unspecified: Secondary | ICD-10-CM | POA: Diagnosis present

## 2016-06-25 DIAGNOSIS — M25552 Pain in left hip: Secondary | ICD-10-CM | POA: Diagnosis present

## 2016-06-25 DIAGNOSIS — G894 Chronic pain syndrome: Secondary | ICD-10-CM | POA: Diagnosis present

## 2016-06-25 HISTORY — PX: TOTAL HIP ARTHROPLASTY: SHX124

## 2016-06-25 SURGERY — ARTHROPLASTY, HIP, TOTAL, ANTERIOR APPROACH
Anesthesia: Spinal | Laterality: Left

## 2016-06-25 MED ORDER — OXYCODONE-ACETAMINOPHEN 5-325 MG PO TABS: 1.0000 | ORAL_TABLET | ORAL | 0 refills | Status: DC | PRN

## 2016-06-25 MED ORDER — BUPIVACAINE HCL (PF) 0.25 % IJ SOLN
INTRAMUSCULAR | Status: AC
  Filled 2016-06-25: qty 30

## 2016-06-25 MED ORDER — LACTATED RINGERS IV SOLN
INTRAVENOUS | Status: DC
  Administered 2016-06-25 (×3): via INTRAVENOUS

## 2016-06-25 MED ORDER — PROPOFOL 1000 MG/100ML IV EMUL
INTRAVENOUS | Status: AC
  Filled 2016-06-25: qty 300

## 2016-06-25 MED ORDER — CHLORHEXIDINE GLUCONATE 4 % EX LIQD: 60.0000 mL | Freq: Once | CUTANEOUS | Status: DC

## 2016-06-25 MED ORDER — PHENOL 1.4 % MT LIQD: 1.0000 | OROMUCOSAL | Status: DC | PRN

## 2016-06-25 MED ORDER — DEXAMETHASONE SODIUM PHOSPHATE 10 MG/ML IJ SOLN
10.0000 mg | Freq: Once | INTRAMUSCULAR | Status: AC
  Administered 2016-06-26: 10 mg via INTRAVENOUS
  Filled 2016-06-25: qty 1

## 2016-06-25 MED ORDER — ASPIRIN EC 325 MG PO TBEC: 325.0000 mg | DELAYED_RELEASE_TABLET | Freq: Two times a day (BID) | ORAL | 0 refills | Status: DC

## 2016-06-25 MED ORDER — ALLOPURINOL 300 MG PO TABS
300.0000 mg | ORAL_TABLET | Freq: Every evening | ORAL | Status: DC
  Administered 2016-06-25 – 2016-06-26 (×2): 300 mg via ORAL
  Filled 2016-06-25 (×2): qty 1

## 2016-06-25 MED ORDER — POLYETHYLENE GLYCOL 3350 17 G PO PACK: 17.0000 g | PACK | Freq: Every day | ORAL | Status: DC | PRN

## 2016-06-25 MED ORDER — MIDAZOLAM HCL 2 MG/2ML IJ SOLN
INTRAMUSCULAR | Status: AC
  Filled 2016-06-25: qty 2

## 2016-06-25 MED ORDER — EPINEPHRINE PF 1 MG/ML IJ SOLN
INTRAMUSCULAR | Status: DC | PRN
  Administered 2016-06-25: .15 mg

## 2016-06-25 MED ORDER — LISINOPRIL 40 MG PO TABS
40.0000 mg | ORAL_TABLET | Freq: Every evening | ORAL | Status: DC
  Administered 2016-06-25 – 2016-06-26 (×2): 40 mg via ORAL
  Filled 2016-06-25 (×2): qty 1

## 2016-06-25 MED ORDER — HYDROXYZINE HCL 25 MG PO TABS
25.0000 mg | ORAL_TABLET | Freq: Every evening | ORAL | Status: DC | PRN
  Administered 2016-06-25: 25 mg via ORAL
  Filled 2016-06-25: qty 1

## 2016-06-25 MED ORDER — EPINEPHRINE PF 1 MG/ML IJ SOLN
INTRAMUSCULAR | Status: AC
  Filled 2016-06-25: qty 1

## 2016-06-25 MED ORDER — BUPIVACAINE HCL (PF) 0.25 % IJ SOLN
INTRAMUSCULAR | Status: DC | PRN
  Administered 2016-06-25: 30 mL

## 2016-06-25 MED ORDER — FAMOTIDINE 20 MG PO TABS
20.0000 mg | ORAL_TABLET | Freq: Every day | ORAL | Status: DC
  Administered 2016-06-26 – 2016-06-27 (×2): 20 mg via ORAL
  Filled 2016-06-25 (×2): qty 1

## 2016-06-25 MED ORDER — 0.9 % SODIUM CHLORIDE (POUR BTL) OPTIME
TOPICAL | Status: DC | PRN
  Administered 2016-06-25: 1000 mL

## 2016-06-25 MED ORDER — METHOCARBAMOL 1000 MG/10ML IJ SOLN
500.0000 mg | Freq: Four times a day (QID) | INTRAVENOUS | Status: DC | PRN
  Filled 2016-06-25: qty 5

## 2016-06-25 MED ORDER — ALBUTEROL SULFATE (2.5 MG/3ML) 0.083% IN NEBU
2.5000 mg | INHALATION_SOLUTION | Freq: Four times a day (QID) | RESPIRATORY_TRACT | Status: DC | PRN
  Administered 2016-06-26: 2.5 mg via RESPIRATORY_TRACT
  Filled 2016-06-25: qty 3

## 2016-06-25 MED ORDER — CELECOXIB 200 MG PO CAPS
200.0000 mg | ORAL_CAPSULE | Freq: Two times a day (BID) | ORAL | Status: DC
  Administered 2016-06-25 – 2016-06-27 (×4): 200 mg via ORAL
  Filled 2016-06-25 (×4): qty 1

## 2016-06-25 MED ORDER — FLEET ENEMA 7-19 GM/118ML RE ENEM: 1.0000 | ENEMA | Freq: Once | RECTAL | Status: DC | PRN

## 2016-06-25 MED ORDER — MEPERIDINE HCL 25 MG/ML IJ SOLN: 6.2500 mg | INTRAMUSCULAR | Status: DC | PRN

## 2016-06-25 MED ORDER — HYDROMORPHONE HCL 2 MG/ML IJ SOLN
INTRAMUSCULAR | Status: AC
  Administered 2016-06-25: 1 mg via INTRAVENOUS
  Filled 2016-06-25: qty 1

## 2016-06-25 MED ORDER — PROPOFOL 10 MG/ML IV BOLUS
INTRAVENOUS | Status: AC
  Filled 2016-06-25: qty 20

## 2016-06-25 MED ORDER — ONDANSETRON HCL 4 MG/2ML IJ SOLN: 4.0000 mg | Freq: Once | INTRAMUSCULAR | Status: DC | PRN

## 2016-06-25 MED ORDER — BISACODYL 5 MG PO TBEC: 5.0000 mg | DELAYED_RELEASE_TABLET | Freq: Every day | ORAL | Status: DC | PRN

## 2016-06-25 MED ORDER — LIDOCAINE 2% (20 MG/ML) 5 ML SYRINGE
INTRAMUSCULAR | Status: AC
  Filled 2016-06-25: qty 5

## 2016-06-25 MED ORDER — TRANEXAMIC ACID 1000 MG/10ML IV SOLN
1000.0000 mg | Freq: Once | INTRAVENOUS | Status: AC
  Administered 2016-06-25: 1000 mg via INTRAVENOUS
  Filled 2016-06-25: qty 10

## 2016-06-25 MED ORDER — METOCLOPRAMIDE HCL 5 MG PO TABS: 5.0000 mg | ORAL_TABLET | Freq: Three times a day (TID) | ORAL | Status: DC | PRN

## 2016-06-25 MED ORDER — ACETAMINOPHEN 650 MG RE SUPP: 650.0000 mg | Freq: Four times a day (QID) | RECTAL | Status: DC | PRN

## 2016-06-25 MED ORDER — SODIUM CHLORIDE 0.9 % IV SOLN
INTRAVENOUS | Status: DC | PRN
  Administered 2016-06-25: 2000 mg via TOPICAL

## 2016-06-25 MED ORDER — FENTANYL CITRATE (PF) 100 MCG/2ML IJ SOLN
INTRAMUSCULAR | Status: AC
  Filled 2016-06-25: qty 2

## 2016-06-25 MED ORDER — HYDROMORPHONE HCL 2 MG/ML IJ SOLN
1.0000 mg | INTRAMUSCULAR | Status: DC | PRN
  Administered 2016-06-25 – 2016-06-27 (×9): 1 mg via INTRAVENOUS
  Filled 2016-06-25 (×9): qty 1

## 2016-06-25 MED ORDER — KCL IN DEXTROSE-NACL 20-5-0.45 MEQ/L-%-% IV SOLN
INTRAVENOUS | Status: DC
  Administered 2016-06-25 – 2016-06-26 (×2): via INTRAVENOUS
  Filled 2016-06-25 (×2): qty 1000

## 2016-06-25 MED ORDER — DIAZEPAM 5 MG PO TABS
5.0000 mg | ORAL_TABLET | Freq: Every day | ORAL | Status: DC | PRN
  Administered 2016-06-25: 5 mg via ORAL
  Filled 2016-06-25: qty 1

## 2016-06-25 MED ORDER — BUPIVACAINE LIPOSOME 1.3 % IJ SUSP
INTRAMUSCULAR | Status: DC | PRN
  Administered 2016-06-25: 20 mL

## 2016-06-25 MED ORDER — MENTHOL 3 MG MT LOZG: 1.0000 | LOZENGE | OROMUCOSAL | Status: DC | PRN

## 2016-06-25 MED ORDER — OXYCODONE HCL 5 MG PO TABS
5.0000 mg | ORAL_TABLET | ORAL | Status: DC | PRN
  Administered 2016-06-25: 5 mg via ORAL
  Administered 2016-06-25: 10 mg via ORAL
  Administered 2016-06-25: 5 mg via ORAL
  Administered 2016-06-26 – 2016-06-27 (×6): 10 mg via ORAL
  Filled 2016-06-25: qty 1
  Filled 2016-06-25 (×5): qty 2
  Filled 2016-06-25: qty 1
  Filled 2016-06-25 (×2): qty 2

## 2016-06-25 MED ORDER — GABAPENTIN 300 MG PO CAPS
300.0000 mg | ORAL_CAPSULE | Freq: Three times a day (TID) | ORAL | Status: DC
  Administered 2016-06-25 – 2016-06-27 (×6): 300 mg via ORAL
  Filled 2016-06-25 (×6): qty 1

## 2016-06-25 MED ORDER — FUROSEMIDE 40 MG PO TABS
40.0000 mg | ORAL_TABLET | Freq: Every day | ORAL | Status: DC
  Administered 2016-06-25 – 2016-06-27 (×3): 40 mg via ORAL
  Filled 2016-06-25 (×3): qty 1

## 2016-06-25 MED ORDER — ONDANSETRON HCL 4 MG/2ML IJ SOLN: 4.0000 mg | Freq: Four times a day (QID) | INTRAMUSCULAR | Status: DC | PRN

## 2016-06-25 MED ORDER — BUPIVACAINE HCL (PF) 0.5 % IJ SOLN
INTRAMUSCULAR | Status: AC
  Filled 2016-06-25: qty 10

## 2016-06-25 MED ORDER — HYDROMORPHONE HCL 1 MG/ML IJ SOLN
0.2500 mg | INTRAMUSCULAR | Status: DC | PRN
  Administered 2016-06-25 (×3): 0.5 mg via INTRAVENOUS

## 2016-06-25 MED ORDER — MIDAZOLAM HCL 5 MG/5ML IJ SOLN
INTRAMUSCULAR | Status: DC | PRN
  Administered 2016-06-25: 0.5 mg via INTRAVENOUS
  Administered 2016-06-25 (×2): 1 mg via INTRAVENOUS

## 2016-06-25 MED ORDER — METHOCARBAMOL 500 MG PO TABS
500.0000 mg | ORAL_TABLET | Freq: Four times a day (QID) | ORAL | Status: DC | PRN
  Administered 2016-06-25 – 2016-06-27 (×5): 500 mg via ORAL
  Filled 2016-06-25 (×5): qty 1

## 2016-06-25 MED ORDER — METOPROLOL TARTRATE 50 MG PO TABS
50.0000 mg | ORAL_TABLET | Freq: Two times a day (BID) | ORAL | Status: DC
  Administered 2016-06-25 – 2016-06-27 (×4): 50 mg via ORAL
  Filled 2016-06-25 (×4): qty 1

## 2016-06-25 MED ORDER — ALUMINUM HYDROXIDE GEL 320 MG/5ML PO SUSP
15.0000 mL | ORAL | Status: DC | PRN
  Filled 2016-06-25: qty 30

## 2016-06-25 MED ORDER — DOCUSATE SODIUM 100 MG PO CAPS
100.0000 mg | ORAL_CAPSULE | Freq: Two times a day (BID) | ORAL | Status: DC
  Administered 2016-06-25 – 2016-06-27 (×4): 100 mg via ORAL
  Filled 2016-06-25 (×4): qty 1

## 2016-06-25 MED ORDER — ONDANSETRON HCL 4 MG PO TABS: 4.0000 mg | ORAL_TABLET | Freq: Four times a day (QID) | ORAL | Status: DC | PRN

## 2016-06-25 MED ORDER — HYDROMORPHONE HCL 1 MG/ML IJ SOLN: 1.0000 mg | INTRAMUSCULAR | Status: DC | PRN

## 2016-06-25 MED ORDER — ACETAMINOPHEN 325 MG PO TABS
650.0000 mg | ORAL_TABLET | Freq: Four times a day (QID) | ORAL | Status: DC | PRN
  Administered 2016-06-25: 650 mg via ORAL
  Filled 2016-06-25: qty 2

## 2016-06-25 MED ORDER — METOCLOPRAMIDE HCL 5 MG/ML IJ SOLN: 5.0000 mg | Freq: Three times a day (TID) | INTRAMUSCULAR | Status: DC | PRN

## 2016-06-25 MED ORDER — DIPHENHYDRAMINE HCL 12.5 MG/5ML PO ELIX
12.5000 mg | ORAL_SOLUTION | ORAL | Status: DC | PRN
Start: 2016-06-25 — End: 2016-06-27

## 2016-06-25 MED ORDER — BUPIVACAINE LIPOSOME 1.3 % IJ SUSP
20.0000 mL | INTRAMUSCULAR | Status: DC
  Filled 2016-06-25: qty 20

## 2016-06-25 MED ORDER — TIZANIDINE HCL 2 MG PO TABS: 2.0000 mg | ORAL_TABLET | Freq: Four times a day (QID) | ORAL | 0 refills | Status: DC | PRN

## 2016-06-25 MED ORDER — DULOXETINE HCL 30 MG PO CPEP
30.0000 mg | ORAL_CAPSULE | Freq: Every evening | ORAL | Status: DC
  Administered 2016-06-25 – 2016-06-26 (×2): 30 mg via ORAL
  Filled 2016-06-25 (×2): qty 1

## 2016-06-25 MED ORDER — NICOTINE POLACRILEX 4 MG MT LOZG
4.0000 mg | LOZENGE | OROMUCOSAL | Status: DC | PRN
  Filled 2016-06-25: qty 1

## 2016-06-25 MED ORDER — PROPOFOL 10 MG/ML IV BOLUS
INTRAVENOUS | Status: DC | PRN
  Administered 2016-06-25: 30 mg via INTRAVENOUS
  Administered 2016-06-25: 20 mg via INTRAVENOUS
  Administered 2016-06-25: 40 mg via INTRAVENOUS

## 2016-06-25 MED ORDER — AMLODIPINE BESYLATE 5 MG PO TABS
5.0000 mg | ORAL_TABLET | Freq: Every evening | ORAL | Status: DC
  Administered 2016-06-25 – 2016-06-26 (×2): 5 mg via ORAL
  Filled 2016-06-25 (×2): qty 1

## 2016-06-25 MED ORDER — FENTANYL CITRATE (PF) 100 MCG/2ML IJ SOLN
INTRAMUSCULAR | Status: DC | PRN
  Administered 2016-06-25 (×2): 50 ug via INTRAVENOUS

## 2016-06-25 MED ORDER — PROPOFOL 500 MG/50ML IV EMUL
INTRAVENOUS | Status: DC | PRN
Start: 2016-06-25 — End: 2016-06-25
  Administered 2016-06-25: 100 ug/kg/min via INTRAVENOUS

## 2016-06-25 MED ORDER — ONDANSETRON HCL 4 MG/2ML IJ SOLN
INTRAMUSCULAR | Status: AC
  Filled 2016-06-25: qty 2

## 2016-06-25 MED ORDER — ASPIRIN EC 325 MG PO TBEC
325.0000 mg | DELAYED_RELEASE_TABLET | Freq: Every day | ORAL | Status: DC
  Administered 2016-06-27: 325 mg via ORAL
  Filled 2016-06-25 (×2): qty 1

## 2016-06-25 MED ORDER — SODIUM CHLORIDE 0.9 % IJ SOLN
INTRAMUSCULAR | Status: DC | PRN
  Administered 2016-06-25: 50 mL via INTRAVENOUS

## 2016-06-25 SURGICAL SUPPLY — 47 items
BAG DECANTER FOR FLEXI CONT (MISCELLANEOUS) ×3 IMPLANT
BLADE SURG ROTATE 9660 (MISCELLANEOUS) IMPLANT
CAPT HIP TOTAL 2 ×3 IMPLANT
COVER PERINEAL POST (MISCELLANEOUS) ×3 IMPLANT
COVER SURGICAL LIGHT HANDLE (MISCELLANEOUS) ×6 IMPLANT
DRAPE C-ARM 42X72 X-RAY (DRAPES) ×3 IMPLANT
DRAPE STERI IOBAN 125X83 (DRAPES) ×3 IMPLANT
DRAPE U-SHAPE 47X51 STRL (DRAPES) ×6 IMPLANT
DRSG AQUACEL AG ADV 3.5X10 (GAUZE/BANDAGES/DRESSINGS) ×3 IMPLANT
DURAPREP 26ML APPLICATOR (WOUND CARE) ×3 IMPLANT
ELECT BLADE 4.0 EZ CLEAN MEGAD (MISCELLANEOUS) ×3
ELECT REM PT RETURN 9FT ADLT (ELECTROSURGICAL) ×3
ELECTRODE BLDE 4.0 EZ CLN MEGD (MISCELLANEOUS) ×1 IMPLANT
ELECTRODE REM PT RTRN 9FT ADLT (ELECTROSURGICAL) ×1 IMPLANT
FACESHIELD WRAPAROUND (MASK) ×6 IMPLANT
GLOVE BIO SURGEON STRL SZ7.5 (GLOVE) ×3 IMPLANT
GLOVE BIO SURGEON STRL SZ8.5 (GLOVE) ×3 IMPLANT
GLOVE BIOGEL PI IND STRL 8 (GLOVE) ×1 IMPLANT
GLOVE BIOGEL PI IND STRL 9 (GLOVE) ×1 IMPLANT
GLOVE BIOGEL PI INDICATOR 8 (GLOVE) ×2
GLOVE BIOGEL PI INDICATOR 9 (GLOVE) ×2
GOWN STRL REUS W/ TWL LRG LVL3 (GOWN DISPOSABLE) ×1 IMPLANT
GOWN STRL REUS W/ TWL XL LVL3 (GOWN DISPOSABLE) ×3 IMPLANT
GOWN STRL REUS W/TWL LRG LVL3 (GOWN DISPOSABLE) ×2
GOWN STRL REUS W/TWL XL LVL3 (GOWN DISPOSABLE) ×6
KIT BASIN OR (CUSTOM PROCEDURE TRAY) ×3 IMPLANT
KIT ROOM TURNOVER OR (KITS) ×3 IMPLANT
MANIFOLD NEPTUNE II (INSTRUMENTS) ×3 IMPLANT
NEEDLE 22X1 1/2 OR ONLY (MISCELLANEOUS) ×4
NEEDLE 22X1.5 STRL (OR ONLY) (MISCELLANEOUS) ×2 IMPLANT
NS IRRIG 1000ML POUR BTL (IV SOLUTION) ×3 IMPLANT
PACK TOTAL JOINT (CUSTOM PROCEDURE TRAY) ×3 IMPLANT
PAD ARMBOARD 7.5X6 YLW CONV (MISCELLANEOUS) ×6 IMPLANT
SAW OSC TIP CART 19.5X105X1.3 (SAW) ×3 IMPLANT
SUT ETHIBOND NAB CT1 #1 30IN (SUTURE) ×3 IMPLANT
SUT VIC AB 1 CTX 36 (SUTURE) ×2
SUT VIC AB 1 CTX36XBRD ANBCTR (SUTURE) ×1 IMPLANT
SUT VIC AB 2-0 CT1 27 (SUTURE) ×6
SUT VIC AB 2-0 CT1 TAPERPNT 27 (SUTURE) ×3 IMPLANT
SUT VIC AB 3-0 PS2 18 (SUTURE) ×2
SUT VIC AB 3-0 PS2 18XBRD (SUTURE) ×1 IMPLANT
SYR CONTROL 10ML LL (SYRINGE) ×6 IMPLANT
TOWEL OR 17X24 6PK STRL BLUE (TOWEL DISPOSABLE) ×3 IMPLANT
TOWEL OR 17X26 10 PK STRL BLUE (TOWEL DISPOSABLE) ×3 IMPLANT
TRAY CATH 16FR W/PLASTIC CATH (SET/KITS/TRAYS/PACK) IMPLANT
TRAY FOLEY CATH 14FR (SET/KITS/TRAYS/PACK) IMPLANT
WATER STERILE IRR 1000ML POUR (IV SOLUTION) IMPLANT

## 2016-06-25 NOTE — Op Note (Signed)
OPERATIVE REPORT    DATE OF PROCEDURE:  06/25/2016       PREOPERATIVE DIAGNOSIS:  LEFT HIP DEGENERATIUVE JOINT                                                          POSTOPERATIVE DIAGNOSIS:  LEFT HIP DEGENERATIUVE JOINT                                                           PROCEDURE: Anterior L total hip arthroplasty using a 54 mm DePuy Pinnacle  Cup, [employer], 0-degree polyethylene liner, a 1.5 +36 mm ceramic head, a 6 Depuy Triloc stem   SURGEON: Adabelle Griffiths J    ASSISTANT:   Eric K[employer]  (present throughout entire procedure and necessary for timely completion of the procedure)   ANESTHESIA: Spinal BLOOD LOSS: 350 FLUID REPLACEMENT: 1600 crystalloid Antibiotic: 3gm Ancef Tranexamic Acid: 1gm iv, 2gm topical COMPLICATIONS: none    INDICATIONS FOR PROCEDURE: A 45 y.o. year-old With  [place]   for 3 years, x-rays show bone-on-bone arthritic changes, and osteophytes. Despite conservative measures with observation, anti-inflammatory medicine, narcotics, use of a cane, has severe unremitting pain and can ambulate only a few blocks before resting. Patient desires elective L total hip arthroplasty to decrease pain and increase function. The risks, benefits, and alternatives were discussed at length including but not limited to the risks of infection, bleeding, nerve injury, stiffness, blood clots, the need for revision surgery, cardiopulmonary complications, among others, and they were willing to proceed. Questions answered     PROCEDURE IN DETAIL: The patient was identified by armband,  received preoperative IV antibiotics in the holding area at [facility], taken to the operating room , appropriate anesthetic monitors  were attached and  anesthesia was induced with the patienton the gurney. The HANA boots were applied to the feet and he was then transferred to the HANA table with a peroneal post and support underneath the  non-operative le, which was locked in 5 lb traction. Theoperative lower extremity was then prepped and draped in the usual sterile fashion from just above the iliac crest to the knee. And a timeout procedure was performed. We then made a 15 cm incision along the interval at the leading edge of the tensor fascia lata of starting at 2 cm lateral to and 2 cm distal to the ASIS. Small bleeders in the skin and subcutaneous tissue identified and cauterized we dissected down to the fascia and made an incision in the fascia allowing [place] to elevate the fascia of the tensor muscle and exploited the interval between the rectus and the tensor fascia lata. A Hohmann retractor was then placed along the superior neck of the femur and a Cobra retractor along the inferior neck of the femur we teed the capsule starting out at the superior anterior aspect of the acetabulum going distally and made the T along the neck both leaflets of the T were tagged with #2 Ethibond suture. Cobra retractors were then placed along the inferior and superior neck allowing [place] to perform a standard neck  cut and removed the femoral head with a power corkscrew. We then placed a right angle Hohmann retractor along the anterior aspect of the acetabulum a spiked Cobra in the cotyloid notch and posteriorly a Muelller retractor. We then sequentially reamed up to a 53 mm basket reamer obtaining good coverage in all quadrants, verified by C-arm imaging. Under C-arm control with and hammered into place a 54 mm Pinnacle cup in 45 of abduction and 15 of anteversion. The cup seated nicely and required no supplemental screws. We then placed a central hole Eliminator and a 0 polyethylene liner. The foot was then externally rotated to 110, the HANA elevator was placed around the flare of the greater trochanter and the limb was extended and abducted delivering the proximal femur up into the wound. A medium Hohmann retractor was placed over the greater trochanter and a  Mueller retractor along the posterior femoral neck completing the exposure. We then performed releases superiorly and and inferiorly of the capsule going back to the pirformis fossa superiorly and to the lesser trochanter inferiorly. We then entered the proximal femur with the box cutting offset chisel followed by, a canal sounder, the chili pepper and broaching up to a 6 broach. This seated nicely and we reamed the calcar. A trial reduction was performed with a 1.5 mm 36 mm head.The limb lengths were excellent the hip was stable in 90 of external rotation. At this point the trial components removed and we hammered into place a # 6 Tri-Lock stem with Gryption coating. This was a Hi offset stem and a + 1.5 36 mm ceramic ball was then hammered into place the hip was reduced and final C-arm images obtained. The wound was thoroughly irrigated with normal saline solution. We repaired the ant capsule and the tensor fascia lot a with running 0 vicryl suture. the subcutaneous tissue was closed with 2-0 and 3-0 Vicryl suture followed by an Aquacil dressing. At this point the patient was awaken and transferred to hospital gurney without difficulty. The subcutaneous tissue with 0 and 2-0 undyed Vicryl suture and the skin with running  3-0 vicryl subcuticular suture. Aquacil dressing was applied. The patient was then unclamped, rolled supine, awaken extubated and taken to recovery room without difficulty in stable condition.   Frederik Pear J 06/25/2016, 2:06 PM

## 2016-06-25 NOTE — Anesthesia Preprocedure Evaluation (Addendum)
Anesthesia Evaluation  Patient identified by MRN, date of birth, ID band Patient awake    Reviewed: Allergy & Precautions, NPO status , Patient's Chart, lab work & pertinent test results  Airway Mallampati: II  TM Distance: >3 FB Neck ROM: Full    Dental  (+) Poor Dentition, Dental Advisory Given, Edentulous Upper, Missing   Pulmonary asthma , COPD, Current Smoker, former smoker,    Pulmonary exam normal        Cardiovascular hypertension, Pt. on medications Normal cardiovascular exam     Neuro/Psych Anxiety Depression    GI/Hepatic GERD  Medicated and Controlled,  Endo/Other    Renal/GU      Musculoskeletal  (+) Arthritis ,   Abdominal   Peds  Hematology   Anesthesia Other Findings Full Beard.  Several broken and decaying teeth below.  Reproductive/Obstetrics                           Anesthesia Physical Anesthesia Plan  ASA: III  Anesthesia Plan: Spinal   Post-op Pain Management:    Induction: Intravenous  Airway Management Planned: Simple Face Mask  Additional Equipment:   Intra-op Plan:   Post-operative Plan:   Informed Consent: I have reviewed the patients History and Physical, chart, labs and discussed the procedure including the risks, benefits and alternatives for the proposed anesthesia with the patient or authorized representative who has indicated his/her understanding and acceptance.     Plan Discussed with: CRNA and Surgeon  Anesthesia Plan Comments:        Anesthesia Quick Evaluation

## 2016-06-25 NOTE — Anesthesia Postprocedure Evaluation (Signed)
Anesthesia Post Note  Patient: [place]  Procedure(s) Performed: Procedure(s) (LRB): TOTAL HIP ARTHROPLASTY ANTERIOR APPROACH (Left)  Patient location during evaluation: PACU Anesthesia Type: Spinal Level of consciousness: oriented and awake and alert Pain management: pain level controlled Vital Signs Assessment: post-procedure vital signs reviewed and stable Respiratory status: spontaneous breathing, respiratory function stable and patient connected to nasal cannula oxygen Cardiovascular status: blood pressure returned to baseline and stable Postop Assessment: no headache and no backache Anesthetic complications: no    Last Vitals:  Vitals:   06/25/16 1535 06/25/16 1555  BP: (!) 111/97 (!) 119/91  Pulse:  76  Resp: 16 16  Temp:      Last Pain:  Vitals:   06/25/16 1555  TempSrc:   PainSc: 4             L Sensory Level: S1-Sole of foot, small toes (06/25/16 1555) R Sensory Level: S1-Sole of foot, small toes (06/25/16 1555)  Ori Kreiter DAVID

## 2016-06-25 NOTE — Addendum Note (Signed)
Addendum  created 06/25/16 1629 by Terrill Mohr, CRNA   Anesthesia Intra Flowsheets edited

## 2016-06-25 NOTE — Interval H&P Note (Signed)
History and Physical Interval Note:  06/25/2016 11:47 AM  Kersey  has presented today for surgery, with the diagnosis of LEFT HIP [place]  The various methods of treatment have been discussed with the patient and family. After consideration of risks, benefits and other options for treatment, the patient has consented to  Procedure(s): TOTAL HIP ARTHROPLASTY ANTERIOR APPROACH (Left) as a surgical intervention .  The patient's history has been reviewed, patient examined, no change in status, stable for surgery.  I have reviewed the patient's chart and labs.  Questions were answered to the patient's satisfaction.     Kerin Salen

## 2016-06-25 NOTE — Transfer of Care (Signed)
Immediate Anesthesia Transfer of Care Note  Patient: Preston [name]  Procedure(s) Performed: Procedure(s): TOTAL HIP ARTHROPLASTY ANTERIOR APPROACH (Left)  Patient Location: PACU  Anesthesia Type:Spinal  Level of Consciousness: awake, alert , oriented and patient cooperative  Airway & Oxygen Therapy: Patient Spontanous Breathing and Patient connected to nasal cannula oxygen  Post-op Assessment: Report given to RN, Post -op Vital signs reviewed and stable and Patient moving all extremities  Post vital signs: Reviewed and stable  Last Vitals:  Vitals:   06/25/16 0955  BP: (!) 149/97  Pulse: 78  Resp: 20  Temp: 37.6 C    Last Pain:  Vitals:   06/25/16 0955  TempSrc: Oral         Complications: No apparent anesthesia complications

## 2016-06-26 ENCOUNTER — Encounter (HOSPITAL_COMMUNITY): Payer: Self-pay | Admitting: Orthopedic Surgery

## 2016-06-26 LAB — BASIC METABOLIC PANEL
ANION GAP: 10 (ref 5–15)
BUN: 15 mg/dL (ref 6–20)
CALCIUM: 9.4 mg/dL (ref 8.9–10.3)
CO2: 26 mmol/L (ref 22–32)
Chloride: 101 mmol/L (ref 101–111)
Creatinine, Ser: 2.01 mg/dL — ABNORMAL HIGH (ref 0.61–1.24)
GFR, EST AFRICAN AMERICAN: 45 mL/min — AB (ref >60)
GFR, EST NON AFRICAN AMERICAN: 39 mL/min — AB (ref >60)
GLUCOSE: 171 mg/dL — AB (ref 65–99)
Potassium: 4.9 mmol/L (ref 3.5–5.1)
SODIUM: 137 mmol/L (ref 135–145)

## 2016-06-26 LAB — CBC
HCT: 40.2 % (ref 39.0–52.0)
Hemoglobin: 13 g/dL (ref 13.0–17.0)
MCH: 30.6 pg (ref 26.0–34.0)
MCHC: 32.3 g/dL (ref 30.0–36.0)
MCV: 94.6 fL (ref 78.0–100.0)
PLATELETS: 264 10*3/uL (ref 150–400)
RBC: 4.25 MIL/uL (ref 4.22–5.81)
RDW: 13.1 % (ref 11.5–15.5)
WBC: 15.3 10*3/uL — AB (ref 4.0–10.5)

## 2016-06-26 MED ORDER — ASPIRIN 325 MG PO TABS
ORAL_TABLET | ORAL | Status: AC
  Administered 2016-06-26: 325 mg
  Filled 2016-06-26: qty 1

## 2016-06-26 NOTE — Progress Notes (Addendum)
PATIENT ID: Preston [name]  MRN: [id number]  DOB/AGE:  03-03-1971 / 45 y.o.  1 Day Post-Op Procedure(s) (LRB): TOTAL HIP ARTHROPLASTY ANTERIOR APPROACH (Left)    PROGRESS NOTE Subjective: Patient is alert, oriented, no Nausea, no Vomiting, yes passing gas, . Taking PO well. Denies SOB, Chest or Calf Pain. Using Incentive Spirometer, PAS in place. Ambulate up to BR. Nurse has noted swelling since surgery. On further questioning, that is normal for him Patient reports pain as  3/10  .    Objective: Vital signs in last 24 hours: Vitals:   06/25/16 1555 06/25/16 1956 06/26/16 0030 06/26/16 0500  BP: (!) 119/91 119/81 112/74 124/73  Pulse: 76 90 84 82  Resp: 16 18 17 17   Temp:  99.1 F (37.3 C) 97.6 F (36.4 C) 98.1 F (36.7 C)  TempSrc:  Oral Oral Oral  SpO2: 96% 96% 96% 95%  Weight:          Intake/Output from previous day: I/O last 3 completed shifts: In: 2630 [P.O.:350; I.V.:2120; IV Piggyback:160] Out: 250 [Blood:250]   Intake/Output this shift: Total I/O In: 397.9 [I.V.:397.9] Out: 950 [Urine:950]   LABORATORY DATA:  Recent Labs  06/26/16 0507  WBC 15.3*  HGB 13.0  HCT 40.2  PLT 264  NA 137  K 4.9  CL 101  CO2 26  BUN 15  CREATININE 2.01*  GLUCOSE 171*  CALCIUM 9.4    Examination: Neurologically intact ABD soft Neurovascular intact Sensation intact distally Intact pulses distally Dorsiflexion/Plantar flexion intact Incision: dressing C/D/I No cellulitis present Compartment soft} XR AP&Lat of hip shows well placed\fixed THA  Assessment:   1 Day Post-Op Procedure(s) (LRB): TOTAL HIP ARTHROPLASTY ANTERIOR APPROACH (Left) ADDITIONAL DIAGNOSIS:  Expected Acute Blood Loss Anemia, CHF, COPD, Gout,  Tobacco abuse, morbid obesity, History of diverticulitis Plan: PT/OT WBAT, THA  DVT Prophylaxis: SCDx72 hrs, ASA 325 mg BID x 2 weeks  DISCHARGE PLAN: Home  DISCHARGE NEEDS: HHPT, Walker and 3-in-1 comode seatPatient ID: [name], male   DOB:  1971-05-01, 45 y.o.   MRN: [id number]

## 2016-06-26 NOTE — Care Management Note (Signed)
Case Management Note  Patient Details  Name: Preston [name] MRN: [id number] Date of Birth: 1971/07/12  Subjective/Objective: 45 yr old male s/p left total hip arthroplasty.             Action/Plan: Case manager spoke with patient and family concerning [place] and DME needs. Patient was preoperatively setup with [facility], no changes. CM has ordered Rolling walker and 3in1. Patient will have family support at discharge.    Expected Discharge Date:  06/27/16               Expected Discharge Plan:  [place]  In-House Referral:  NA  Discharge planning Services     Post Acute Care Choice:  Durable Medical Equipment Choice offered to:  Patient  DME Arranged:  3-N-1, Walker rolling DME Agency:  [place]:  PT [place] Agency:  [place]  Status of Service:  Completed, signed off  If discussed at [place] of Stay Meetings, dates discussed:    Additional Comments:  Ninfa Meeker, RN 06/26/2016, 11:59 AM

## 2016-06-26 NOTE — Evaluation (Signed)
Physical Therapy Evaluation Patient Details Name: LEROYAL LARAMORE MRN: [id number] DOB: 1971/01/06 Today's Date: 06/26/2016   History of Present Illness  Pt admitted 11/27 for elective anterior L THA. Pt with h/o anxiety, ETOH abuse and arthritis  Clinical Impression  Pt is s/p THA resulting in the deficits listed below (see PT Problem List). Pt with increased pain, max encouragement to get up out of bed. Pt will benefit from skilled PT to increase their independence and safety with mobility to allow discharge to the venue listed below.      Follow Up Recommendations Home health PT;Supervision/Assistance - 24 hour    Equipment Recommendations  Rolling walker with 5" wheels;3in1 (PT)    Recommendations for Other Services       Precautions / Restrictions Precautions Precautions: Fall Restrictions Weight Bearing Restrictions: No LLE Weight Bearing: Weight bearing as tolerated      Mobility  Bed Mobility Overal bed mobility: Needs Assistance Bed Mobility: Supine to Sit     Supine to sit: Mod assist     General bed mobility comments: labored effort, unable to complete long sit. requires use of rails and modA from PT to elevate trunk  Transfers Overall transfer level: Needs assistance Equipment used: Rolling walker (2 wheeled) Transfers: Sit to/from Stand Sit to Stand: Min assist         General transfer comment: v/c's for hand placement, minA to initially power up and to steady during transition of hands from bed to RW  Ambulation/Gait Ambulation/Gait assistance: Min assist Ambulation Distance (Feet): 120 Feet Assistive device: Rolling walker (2 wheeled) Gait Pattern/deviations: Step-to pattern;Step-through pattern;Decreased step length - right;Decreased stance time - left Gait velocity: slow Gait velocity interpretation: Below normal speed for age/gender General Gait Details: v/c's for optimal sequencing, minA to push walking con't for fluid gait pattern, pt very  dependent on UEs  Stairs            Wheelchair Mobility    Modified Rankin (Stroke Patients Only)       Balance Overall balance assessment: No apparent balance deficits (not formally assessed) (needs RW for standing due to surgery)                                           Pertinent Vitals/Pain Pain Assessment: 0-10 Pain Score: 7  Pain Location: L hip Pain Descriptors / Indicators: Sore Pain Intervention(s): Patient requesting pain meds-RN notified    Home Living Family/patient expects to be discharged to:: Private residence Living Arrangements: Spouse/significant other Available Help at Discharge: Family;Friend(s);Available 24 hours/day (24 hour assistance for a few days.) Type of Home: House Home Access: Stairs to enter Entrance Stairs-Rails: Can reach both Entrance Stairs-Number of Steps: 3 Home Layout: One level Home Equipment: Cane - single point;Crutches      Prior Function Level of Independence: Needs assistance   Gait / Transfers Assistance Needed: Independent  ADL's / Homemaking Assistance Needed: Girlfriend assisting with socks  Comments: limited ambulation due to LE pain     Hand Dominance   Dominant Hand: Right    Extremity/Trunk Assessment   Upper Extremity Assessment: Overall WFL for tasks assessed           Lower Extremity Assessment: LLE deficits/detail   LLE Deficits / Details: pt able to initate L hip flexion and quad set but painfull  Cervical / Trunk Assessment: Normal  Communication   Communication:  No difficulties  Cognition Arousal/Alertness: Awake/alert Behavior During Therapy: WFL for tasks assessed/performed Overall Cognitive Status: Within Functional Limits for tasks assessed                      General Comments General comments (skin integrity, edema, etc.): dressing in tact    Exercises Total Joint Exercises Quad Sets: AROM;Both;10 reps;Seated Long Arc Quad: AAROM;Left;10  reps;Seated   Assessment/Plan    PT Assessment Patient needs continued PT services  PT Problem List Decreased strength;Decreased balance;Decreased activity tolerance;Decreased range of motion;Decreased mobility;Decreased knowledge of use of DME;Decreased safety awareness;Pain          PT Treatment Interventions DME instruction;Gait training;Stair training;Functional mobility training;Therapeutic activities;Therapeutic exercise;Balance training    PT Goals (Current goals can be found in the Care Plan section)  Acute Rehab PT Goals Patient Stated Goal: home PT Goal Formulation: With patient Time For Goal Achievement: 07/03/16 Potential to Achieve Goals: Good    Frequency 7X/week   Barriers to discharge        Co-evaluation               End of Session Equipment Utilized During Treatment: Gait belt Activity Tolerance: Patient tolerated treatment well Patient left: in chair;with call bell/phone within reach;with family/visitor present Nurse Communication: Mobility status         Time: [id number] PT Time Calculation (min) (ACUTE ONLY): 32 min   Charges:   PT Evaluation $PT Eval Moderate Complexity: 1 Procedure PT Treatments $Gait Training: 8-22 mins   PT G Codes:        Mariusz Jubb M Gurvir Schrom 06/26/2016, 11:07 AM  Kittie Plater, PT, DPT Pager #: [phone] Office #: [phone]

## 2016-06-26 NOTE — Progress Notes (Signed)
Physical Therapy Treatment Patient Details Name: Preston [name] MRN: [id number] DOB: [birth year] Today's Date: 06/26/2016    History of Present Illness Pt admitted 11/27 for elective anterior L THA. Pt with h/o anxiety, ETOH abuse and arthritis    PT Comments    The pt is making progress toward all goals.  He tolerated all of the exercises on his HEP without any complaints of increased pain, and his gait distance increased with better form.  Pt will benefit from further PT to increase his ROM and strength in his LLE.  Continue with POC.  Follow Up Recommendations  Home health PT;Supervision/Assistance - 24 hour     Equipment Recommendations  Rolling walker with 5" wheels;3in1 (PT)    Recommendations for Other Services       Precautions / Restrictions Precautions Precautions: Fall Restrictions Weight Bearing Restrictions: No LLE Weight Bearing: Weight bearing as tolerated    Mobility  Bed Mobility Overal bed mobility: Needs Assistance Bed Mobility: Supine to Sit;Sit to Supine     Supine to sit: Supervision Sit to supine: Min assist   General bed mobility comments: Min A to elevate LLE back onto bed.  Transfers Overall transfer level: Needs assistance Equipment used: Rolling walker (2 wheeled) Transfers: Sit to/from Stand Sit to Stand: Min guard         General transfer comment: Verbal cues for hand placement.  Ambulation/Gait Ambulation/Gait assistance: Min guard;Supervision Ambulation Distance (Feet): 290 Feet Assistive device: Rolling walker (2 wheeled) Gait Pattern/deviations: Step-through pattern;Decreased stance time - left;Antalgic Gait velocity: slow Gait velocity interpretation: Below normal speed for age/gender General Gait Details: Verbal cues for sequencing.  Pt maintained better control of RW.   Stairs            Wheelchair Mobility    Modified Rankin (Stroke Patients Only)       Balance     Sitting balance-Leahy Scale: Good        Standing balance-Leahy Scale: Poor                      Cognition Arousal/Alertness: Awake/alert Behavior During Therapy: WFL for tasks assessed/performed Overall Cognitive Status: Within Functional Limits for tasks assessed                      Exercises Total Joint Exercises Ankle Circles/Pumps: AROM;Both;10 reps;Supine Quad Sets: AROM;Left;10 reps;Supine Short Arc Quad: AROM;Left;10 reps;Supine Heel Slides: AROM;Left;10 reps;Supine Hip ABduction/ADduction: AROM;Left;10 reps;Standing Long Arc Quad: AROM;Left;10 reps;Seated Marching in Standing: Left;10 reps;Standing Standing Hip Extension: AROM;Left;10 reps;Standing    General Comments        Pertinent Vitals/Pain Pain Assessment: 0-10 Pain Score: 7  Pain Location: L hip Pain Descriptors / Indicators: Aching;Operative site guarding Pain Intervention(s): Limited activity within patient's tolerance;Monitored during session    Home Living                      Prior Function            PT Goals (current goals can now be found in the care plan section) Acute Rehab PT Goals Patient Stated Goal: home PT Goal Formulation: With patient Time For Goal Achievement: 07/03/16 Potential to Achieve Goals: Good Progress towards PT goals: Progressing toward goals    Frequency    7X/week      PT Plan      Co-evaluation             End of Session Equipment Utilized  During Treatment: Gait belt Activity Tolerance: Patient tolerated treatment well Patient left: in bed;with call bell/phone within reach     Time: 1602-1629 PT Time Calculation (min) (ACUTE ONLY): 27 min  Charges:  $Gait Training: 8-22 mins $Therapeutic Exercise: 8-22 mins                    G Codes:      Bary Castilla July 09, 2016, 5:13 PM  Rito Ehrlich. [place], [place]

## 2016-06-26 NOTE — Evaluation (Signed)
Occupational Therapy Evaluation Patient Details Name: Preston [name] MRN: [id number] DOB: 18-May-1971 Today's Date: 06/26/2016    History of Present Illness Pt admitted 11/27 for elective anterior L THA. Pt with h/o anxiety, ETOH abuse and arthritis   Clinical Impression   PTA, pt required assist to don/doff socks but was independent with other ADL and IADL. Pt currently requires min assist for LB ADL and toilet transfers. Pt would benefit from continued OT services while admitted to improve independence with ADL. Recommend home health OT for OT follow-up and 24 hour assistance from family. OT will continue to follow acutely with focus on tub transfers.    Follow Up Recommendations  Home health OT;Supervision/Assistance - 24 hour    Equipment Recommendations  3 in 1 bedside comode    Recommendations for Other Services       Precautions / Restrictions Precautions Precautions: Fall Restrictions Weight Bearing Restrictions: No LLE Weight Bearing: Weight bearing as tolerated      Mobility Bed Mobility Overal bed mobility: Needs Assistance Bed Mobility: Supine to Sit     Supine to sit: Min assist        Transfers Overall transfer level: Needs assistance Equipment used: Rolling walker (2 wheeled) Transfers: Sit to/from Stand Sit to Stand: Min assist              Balance Overall balance assessment: Needs assistance Sitting-balance support: No upper extremity supported;Feet supported Sitting balance-Leahy Scale: Good     Standing balance support: Bilateral upper extremity supported;During functional activity Standing balance-Leahy Scale: Poor                              ADL Overall ADL's : Needs assistance/impaired     Grooming: Set up;Sitting   Upper Body Bathing: Set up;Sitting   Lower Body Bathing: Minimal assistance;Sit to/from stand   Upper Body Dressing : Set up;Sitting   Lower Body Dressing: Minimal assistance;Sit to/from  stand   Toilet Transfer: Minimal assistance;Min guard;RW;Ambulation   Toileting- Water quality scientist and Hygiene: Min guard;Sit to/from stand       Functional mobility during ADLs: Min guard;Rolling walker;Minimal assistance General ADL Comments: Min assist initially and progressing to min guard. Pt required verbal cues for safe use of RW.     Vision Vision Assessment?: No apparent visual deficits   Perception     Praxis      Pertinent Vitals/Pain Pain Assessment: 0-10 Pain Score: 7  Pain Location: L hip Pain Descriptors / Indicators: Aching;Operative site guarding;Sore Pain Intervention(s): Limited activity within patient's tolerance;Monitored during session;Repositioned     Hand Dominance Right   Extremity/Trunk Assessment Upper Extremity Assessment Upper Extremity Assessment: Overall WFL for tasks assessed   Lower Extremity Assessment Lower Extremity Assessment: LLE deficits/detail LLE Deficits / Details: Decreased strength and ROM as expected post-operatively.       Communication Communication Communication: No difficulties   Cognition Arousal/Alertness: Awake/alert Behavior During Therapy: WFL for tasks assessed/performed Overall Cognitive Status: Within Functional Limits for tasks assessed                     General Comments       Exercises       Shoulder Instructions      Home Living Family/patient expects to be discharged to:: Private residence Living Arrangements: Spouse/significant other Available Help at Discharge: Family;Friend(s);Available 24 hours/day (24 hour assistance for a few days.) Type of Home: House Home Access: Stairs  to enter Entrance Stairs-Number of Steps: 3 Entrance Stairs-Rails: Can reach both Home Layout: One level     Bathroom Shower/Tub: Tub/shower unit Shower/tub characteristics: Architectural technologist: Standard Bathroom Accessibility: Yes   Home Equipment: Cane - single point;Crutches           Prior Functioning/Environment Level of Independence: Needs assistance  Gait / Transfers Assistance Needed: Independent ADL's / Homemaking Assistance Needed: Girlfriend assisting with socks   Comments: limited ambulation due to LE pain        OT Problem List: Decreased strength;Decreased range of motion;Decreased activity tolerance;Impaired balance (sitting and/or standing);Decreased safety awareness;Decreased knowledge of use of DME or AE;Decreased knowledge of precautions;Pain   OT Treatment/Interventions: Self-care/ADL training;Therapeutic exercise;Therapeutic activities;Patient/family education;Balance training;Energy conservation    OT Goals(Current goals can be found in the care plan section) Acute Rehab OT Goals Patient Stated Goal: home OT Goal Formulation: With patient Time For Goal Achievement: 07/10/16 Potential to Achieve Goals: Good ADL Goals Pt Will Perform Lower Body Bathing: with modified independence;with adaptive equipment;sit to/from stand Pt Will Perform Lower Body Dressing: with modified independence;with adaptive equipment;sit to/from stand Pt Will Transfer to Toilet: with modified independence;ambulating Pt Will Perform Toileting - Clothing Manipulation and hygiene: with modified independence;sit to/from stand Pt Will Perform Tub/Shower Transfer: with min guard assist;rolling walker;3 in 1;ambulating  OT Frequency: Min 2X/week   Barriers to D/C:            Co-evaluation              End of Session Equipment Utilized During Treatment: Gait belt;Rolling walker Nurse Communication: Other (comment) (Pt's caregiver concerned about spot on back)  Activity Tolerance: Patient tolerated treatment well Patient left: in chair;with call bell/phone within reach;with family/visitor present   Time: [id number] OT Time Calculation (min): 23 min Charges:  OT General Charges $OT Visit: 1 Procedure OT Evaluation $OT Eval Moderate Complexity: 1 Procedure OT  Treatments $Self Care/Home Management : 8-22 mins  Norman Herrlich, OTR/L [phone] 06/26/2016, 12:18 PM

## 2016-06-27 LAB — CBC
HEMATOCRIT: 34.6 % — AB (ref 39.0–52.0)
HEMOGLOBIN: 11.2 g/dL — AB (ref 13.0–17.0)
MCH: 30.5 pg (ref 26.0–34.0)
MCHC: 32.4 g/dL (ref 30.0–36.0)
MCV: 94.3 fL (ref 78.0–100.0)
Platelets: 275 10*3/uL (ref 150–400)
RBC: 3.67 MIL/uL — ABNORMAL LOW (ref 4.22–5.81)
RDW: 13.2 % (ref 11.5–15.5)
WBC: 19.7 10*3/uL — AB (ref 4.0–10.5)

## 2016-06-27 NOTE — Progress Notes (Signed)
Physical Therapy Discharge Patient Details Name: Preston [name] MRN: [id number] DOB: Mar 29, 1971 Today's Date: 06/27/2016 Time: [id number] PT Time Calculation (min) (ACUTE ONLY): 30 min  Patient discharged from PT services secondary to goals met and no further PT needs identified.  Please see latest therapy progress note for current level of functioning and progress toward goals.    Progress and discharge plan discussed with patient and/or caregiver: Patient/Caregiver agrees with plan  GP     Denice Paradise 06/27/2016, 10:12 AM Amanda [facility] Acute Rehabilitation [phone] (pager)

## 2016-06-27 NOTE — Progress Notes (Signed)
PATIENT ID: Preston [name]  MRN: [id number]  DOB/AGE:  [birth year] / 45 y.o.  2 Days Post-Op Procedure(s) (LRB): TOTAL HIP ARTHROPLASTY ANTERIOR APPROACH (Left)    PROGRESS NOTE Subjective: Patient is alert, oriented, no Nausea, no Vomiting, yes passing gas, . Taking PO well. Denies SOB, Chest or Calf Pain. Using Incentive Spirometer, PAS in place. Ambulate WBAT with pt walking 290 ft yesterday. Patient reports pain as  7/10  .    Objective: Vital signs in last 24 hours: Vitals:   06/26/16 1300 06/26/16 1404 06/26/16 2102 06/27/16 0459  BP: 122/84  138/77 130/85  Pulse: 66  100 99  Resp: 18  18 16   Temp: 98.1 F (36.7 C)  98.2 F (36.8 C) 98.1 F (36.7 C)  TempSrc: Oral  Oral Oral  SpO2: 96% 92% 95% 93%  Weight:          Intake/Output from previous day: I/O last 3 completed shifts: In: 637.9 [P.O.:240; I.V.:397.9] Out: 1550 [Urine:1550]   Intake/Output this shift: No intake/output data recorded.   LABORATORY DATA:  Recent Labs  06/26/16 0507 06/27/16 0400  WBC 15.3* 19.7*  HGB 13.0 11.2*  HCT 40.2 34.6*  PLT 264 275  NA 137  --   K 4.9  --   CL 101  --   CO2 26  --   BUN 15  --   CREATININE 2.01*  --   GLUCOSE 171*  --   CALCIUM 9.4  --     Examination: Neurologically intact Neurovascular intact Sensation intact distally Intact pulses distally Dorsiflexion/Plantar flexion intact Incision: dressing C/D/I and scant drainage No cellulitis present Compartment soft} XR AP&Lat of hip shows well placed\fixed THA  Assessment:   2 Days Post-Op Procedure(s) (LRB): TOTAL HIP ARTHROPLASTY ANTERIOR APPROACH (Left) ADDITIONAL DIAGNOSIS:  Expected Acute Blood Loss Anemia, CHF, COPD, Gout,  Tobacco abuse, morbid obesity,  Plan: PT/OT WBAT, THA  DVT Prophylaxis: SCDx72 hrs, ASA 325 mg BID x 2 weeks  DISCHARGE PLAN: Home  DISCHARGE NEEDS: HHPT, Walker and 3-in-1 comode seat

## 2016-06-27 NOTE — Progress Notes (Signed)
Occupational Therapy Treatment/Discharge Patient Details Name: Preston [name] MRN: [id number] DOB: February 03, 1971 Today's Date: 06/27/2016    History of present illness Pt admitted 11/27 for elective anterior L THA. Pt with h/o anxiety, ETOH abuse and arthritis   OT comments  Pt progressing well toward OT goals. Completed education concerning use of AE for LB ADL and pt able to complete LB ADL with supervision for sit<>stand. Pt began to lose balance while pulling up pants but able to self-correct. Instructed pt and wife in safe tub transfer with tub bench (they will be borrowing one from a friend) and pt able to complete with min guard assist. Pt's wife was present and engaged in session and demonstrates ability to provide necessary assistance. D/C plan remains appropriate. Pt has no further acute OT needs. OT will sign off.  Follow Up Recommendations  Home health OT;Supervision/Assistance - 24 hour    Equipment Recommendations  3 in 1 bedside comode    Recommendations for Other Services      Precautions / Restrictions Precautions Precautions: Fall Restrictions Weight Bearing Restrictions: Yes LLE Weight Bearing: Weight bearing as tolerated       Mobility Bed Mobility               General bed mobility comments: Pt in recliner on arrival just finishing up with OT.    Transfers Overall transfer level: Needs assistance Equipment used: Rolling walker (2 wheeled) Transfers: Sit to/from Stand Sit to Stand: Supervision         General transfer comment: Able to get up and down without physical assist or cues.      Balance Overall balance assessment: Needs assistance Sitting-balance support: No upper extremity supported;Feet supported Sitting balance-Leahy Scale: Good     Standing balance support: Bilateral upper extremity supported;During functional activity Standing balance-Leahy Scale: Fair Standing balance comment: can stand statically without UE support for up to  a minute.               High level balance activites: Direction changes;Turns;Sudden stops High Level Balance Comments: supervision for above.   ADL Overall ADL's : Needs assistance/impaired                     Lower Body Dressing: Supervision/safety;Cueing for sequencing;Sit to/from stand;With adaptive equipment   Toilet Transfer: RW;Ambulation;Supervision/safety       Tub/ Shower Transfer: Min guard;Tub transfer;Tub bench;Rolling walker;Ambulation;Cueing for sequencing   Functional mobility during ADLs: Min guard;Rolling walker General ADL Comments: Educated pt on safe tub transfer, use of AE for LB ADL, and safety with ADL post-operatively.      Vision                     Perception     Praxis      Cognition   Behavior During Therapy: [facility] for tasks assessed/performed Overall Cognitive Status: Within Functional Limits for tasks assessed                       Extremity/Trunk Assessment               Exercises Total Joint Exercises Ankle Circles/Pumps: AROM;Both;10 reps;Supine Quad Sets: AROM;Left;10 reps;Supine Short Arc Quad: AROM;Left;10 reps;Supine Heel Slides: AROM;Left;10 reps;Standing Hip ABduction/ADduction: AROM;Left;10 reps;Standing Long Arc Quad: AROM;Left;10 reps;Seated Marching in Standing: Left;10 reps;Standing Standing Hip Extension: AROM;Left;10 reps;Standing   Shoulder Instructions       General Comments      Pertinent Vitals/ Pain  Pain Assessment: 0-10 Pain Score: 6  Pain Location: left hip and right hip (will have right hip replaced as soon as able) Pain Descriptors / Indicators: Aching;Sore;Tightness Pain Intervention(s): Limited activity within patient's tolerance;Monitored during session;Repositioned;Patient requesting pain meds-RN notified  Home Living                                          Prior Functioning/Environment              Frequency  Min 2X/week         Progress Toward Goals  OT Goals(current goals can now be found in the care plan section)  Progress towards OT goals: Progressing toward goals  Acute Rehab OT Goals Patient Stated Goal: home OT Goal Formulation: With patient Time For Goal Achievement: 07/10/16 Potential to Achieve Goals: Good ADL Goals Pt Will Perform Lower Body Bathing: with modified independence;with adaptive equipment;sit to/from stand Pt Will Perform Lower Body Dressing: with modified independence;with adaptive equipment;sit to/from stand Pt Will Transfer to Toilet: with modified independence;ambulating Pt Will Perform Toileting - Clothing Manipulation and hygiene: with modified independence;sit to/from stand Pt Will Perform Tub/Shower Transfer: with min guard assist;rolling walker;3 in 1;ambulating  Plan Discharge plan remains appropriate    Co-evaluation                 End of Session Equipment Utilized During Treatment: Gait belt;Rolling walker   Activity Tolerance     Patient Left     Nurse Communication          Time: [phone] OT Time Calculation (min): 50 min  Charges: OT General Charges $OT Visit: 1 Procedure OT Treatments $Self Care/Home Management : 38-52 mins  Norman Herrlich, OTR/L [phone] 06/27/2016, 10:28 AM

## 2016-06-27 NOTE — Progress Notes (Signed)
Rn and Cna walked patient to valet parking where patients family picked patient up for discharge. Prescriptions given

## 2016-06-27 NOTE — Discharge Summary (Signed)
Patient ID: Preston [name] MRN: [id number] DOB/AGE: 44-Sep-1972 44 y.o.  Admit date: 06/25/2016 Discharge date: 06/27/2016  Admission Diagnoses:  Principal Problem:   Primary osteoarthritis of left hip Active Problems:   Primary localized osteoarthritis of left hip   Discharge Diagnoses:  Same  Past Medical History:  Diagnosis Date  . Alcohol abuse   . Anxiety   . Arthritis    "hips, knees, back" (03/11/2015)  . Asthma    reports related to anxiety  . CHF (congestive heart failure) ([place])   . Chronic bronchitis ([place])   . Chronic hip pain    "both"  . Chronic lower back pain   . COPD (chronic obstructive pulmonary disease) ([place])   . Diverticulitis   . DJD (degenerative joint disease)   . GERD (gastroesophageal reflux disease)   . Gout   . History of Clostridium difficile infection   . Hypercholesterolemia   . Hypertension    Does not see a cardiologist  . Migraine    "new onset" (03/11/2015)  . Panic attacks   . Pneumonia     Surgeries: Procedure(s): TOTAL HIP ARTHROPLASTY ANTERIOR APPROACH on 06/25/2016   Consultants:   Discharged Condition: Improved  Hospital Course: Preston [name] is an 45 y.o. male who was admitted 06/25/2016 for operative treatment ofPrimary osteoarthritis of left hip. Patient has severe unremitting pain that affects sleep, daily activities, and work/hobbies. After pre-op clearance the patient was taken to the operating room on 06/25/2016 and underwent  Procedure(s): TOTAL HIP ARTHROPLASTY ANTERIOR APPROACH.    Patient was given perioperative antibiotics: Anti-infectives    Start     Dose/Rate Route Frequency Ordered Stop   06/25/16 1130  ceFAZolin (ANCEF) 3 g in dextrose 5 % 50 mL IVPB     3 g 130 mL/hr over 30 Minutes Intravenous To ShortStay Surgical 06/22/16 1210 06/25/16 1300       Patient was given sequential compression devices, early ambulation, and chemoprophylaxis to prevent DVT.  Patient benefited maximally from hospital  stay and there were no complications.    Recent vital signs: Patient Vitals for the past 24 hrs:  BP Temp Temp src Pulse Resp SpO2  06/27/16 0459 130/85 98.1 F (36.7 C) Oral 99 16 93 %  06/26/16 2102 138/77 98.2 F (36.8 C) Oral 100 18 95 %  06/26/16 1404 - - - - - 92 %  06/26/16 1300 122/84 98.1 F (36.7 C) Oral 66 18 96 %  06/26/16 0900 103/75 98.1 F (36.7 C) Oral 85 18 95 %     Recent laboratory studies:  Recent Labs  06/26/16 0507 06/27/16 0400  WBC 15.3* 19.7*  HGB 13.0 11.2*  HCT 40.2 34.6*  PLT 264 275  NA 137  --   K 4.9  --   CL 101  --   CO2 26  --   BUN 15  --   CREATININE 2.01*  --   GLUCOSE 171*  --   CALCIUM 9.4  --      Discharge Medications:     Medication List    STOP taking these medications   acetaminophen-codeine 300-30 MG tablet Commonly known as:  TYLENOL #3   aspirin 325 MG tablet Replaced by:  aspirin EC 325 MG tablet     TAKE these medications   albuterol 108 (90 Base) MCG/ACT inhaler Commonly known as:  PROVENTIL HFA;VENTOLIN HFA Inhale 1-2 puffs into the lungs every 6 (six) hours as needed for wheezing or shortness of breath.  allopurinol 300 MG tablet Commonly known as:  ZYLOPRIM Take 1 tablet (300 mg total) by mouth every evening.   amLODipine 5 MG tablet Commonly known as:  NORVASC Take 1 tablet (5 mg total) by mouth daily. What changed:  when to take this   aspirin EC 325 MG tablet Take 1 tablet (325 mg total) by mouth 2 (two) times daily. Replaces:  aspirin 325 MG tablet   atorvastatin 40 MG tablet Commonly known as:  LIPITOR Take 1 tablet (40 mg total) by mouth every evening.   colchicine 0.6 MG tablet Take 1 tablet (0.6 mg total) by mouth daily as needed. What changed:  reasons to take this   diazepam 5 MG tablet Commonly known as:  VALIUM Take 1 tablet (5 mg total) by mouth daily as needed for anxiety.   DULoxetine 30 MG capsule Commonly known as:  CYMBALTA Take 1 capsule (30 mg total) by mouth  daily. What changed:  when to take this   famotidine 20 MG tablet Commonly known as:  PEPCID Take 1 tablet (20 mg total) by mouth daily.   furosemide 40 MG tablet Commonly known as:  LASIX Take 1 tablet (40 mg total) by mouth daily.   gabapentin 300 MG capsule Commonly known as:  NEURONTIN Take 1 capsule (300 mg total) by mouth 3 (three) times daily.   hydrOXYzine 25 MG tablet Commonly known as:  ATARAX/VISTARIL Take 1 tablet (25 mg total) by mouth 3 (three) times daily as needed for anxiety. What changed:  when to take this   indomethacin 25 MG capsule Commonly known as:  INDOCIN Take 1 capsule (25 mg total) by mouth 3 (three) times daily as needed. For gout flare up. What changed:  reasons to take this  additional instructions   lisinopril 40 MG tablet Commonly known as:  PRINIVIL,ZESTRIL Take 1 tablet (40 mg total) by mouth daily. What changed:  when to take this   metoprolol 50 MG tablet Commonly known as:  LOPRESSOR Take 1 tablet (50 mg total) by mouth 2 (two) times daily.   nicotine polacrilex 4 MG lozenge Commonly known as:  COMMIT Use 1 lozenge as needed every 1-2 hours for 6 weeks, then 1 lozenge every 2-4 hours for 3 weeks, then 1 lozenge every 4-8 hours for 3 weeks What changed:  how much to take  how to take this  when to take this  reasons to take this  additional instructions   oxyCODONE-acetaminophen 5-325 MG tablet Commonly known as:  ROXICET Take 1 tablet by mouth every 4 (four) hours as needed.   tiZANidine 2 MG tablet Commonly known as:  ZANAFLEX Take 1 tablet (2 mg total) by mouth every 6 (six) hours as needed for muscle spasms.            Durable Medical Equipment        Start     Ordered   06/25/16 1608  DME Walker rolling  Once    Question:  Patient needs a walker to treat with the following condition  Answer:  Primary localized osteoarthritis of left hip   06/25/16 1607   06/25/16 1608  DME 3 n 1  Once     06/25/16  1607   06/25/16 1608  DME Bedside commode  Once    Question:  Patient needs a bedside commode to treat with the following condition  Answer:  Primary localized osteoarthritis of left hip   06/25/16 1607      Diagnostic Studies: Dg  Chest 2 View  Result Date: 06/15/2016 CLINICAL DATA:  Pre-op imaging for left hip replacement surgery scheduled for 06-25-16. Hx of HTN. EXAM: CHEST  2 VIEW COMPARISON:  01/13/2015 and multiple prior studies to 2012 FINDINGS: Heart is normal in size. There is prominent mediastinal density which may be related to mediastinal fat and normal mediastinal structures. However additionally there is apparent narrowing of the supra carinal portion of the trachea. Further evaluation is recommended. The lungs are clear. No pulmonary edema or consolidations. IMPRESSION: Question of tracheal narrowing and mediastinal prominence. Further evaluation with CT is recommended. Intravenous contrast is recommended unless it is contraindicated. Electronically Signed   By: Nolon Nations M.D.   On: 06/15/2016 08:18   Ct Chest W Contrast  Result Date: 06/20/2016 CLINICAL DATA:  Abnormal preoperative chest x-ray questioning mediastinal prominence and tracheal narrowing, former smoking history EXAM: CT CHEST WITH CONTRAST TECHNIQUE: Multidetector CT imaging of the chest was performed during intravenous contrast administration. CONTRAST:  77mL ISOVUE-300 IOPAMIDOL (ISOVUE-300) INJECTION 61% COMPARISON:  Chest x-ray of 06/14/2016 FINDINGS: Cardiovascular: The heart is within normal limits in size. No pericardial effusion is seen. The mid ascending thoracic aorta measures 36 mm in diameter. The pulmonary arteries also are opacified with no central abnormality noted. No coronary artery calcifications are seen. Mediastinum/Nodes: Only small mediastinal lymph nodes are present. No mediastinal or hilar adenopathy is seen. The thyroid gland is unremarkable. Lungs/Pleura: On lung window images there is some  respiratory motion of the lung bases obscuring detail. However, no parenchymal infiltrate is seen and there is no evidence of pleural effusion. No suspicious lung nodule or mass is seen. There is some narrowing of the tracheal air shadow, turned saber sheath trachea, which may be due to emphysema. Tracheomalacia is less likely as the etiology. Upper Abdomen: The liver is somewhat low in attenuation which may indicate fatty infiltration. Correlate with LFTs. The pancreas, gallbladder, kidneys, and spleen are unremarkable. Musculoskeletal: The thoracic vertebrae are in normal alignment with degenerative change in the lower thoracic spine. IMPRESSION: 1. No mediastinal or hilar mass or adenopathy is seen. 2. There is narrowing of the tracheal air shadow without extrinsic lesion, suggesting emphysema or less likely tracheomalacia. 3. No suspicious lung nodule or mass. 4. Suspect fatty infiltration of the liver.  Correlate with LFTs. Electronically Signed   By: Ivar Drape M.D.   On: 06/20/2016 16:14   Dg C-arm 1-60 Min  Result Date: 06/25/2016 CLINICAL DATA:  Surgery: Left AA Hip  Fluoro Time: 18 Sec EXAM: DG C-ARM 61-120 MIN; OPERATIVE LEFT HIP WITH PELVIS COMPARISON:  03/29/2014 FINDINGS: Patient has undergone left hip total arthroplasty. There is no evidence for subluxation or interval fracture on the frontal views provided. Normal alignment of the femoral head and acetabular prosthetic components. IMPRESSION: Status post left hip arthroplasty.  No adverse features. Electronically Signed   By: Nolon Nations M.D.   On: 06/25/2016 14:21   Dg Hip Operative Unilat W Or W/o Pelvis Left  Result Date: 06/25/2016 CLINICAL DATA:  Surgery: Left AA Hip  Fluoro Time: 18 Sec EXAM: DG C-ARM 61-120 MIN; OPERATIVE LEFT HIP WITH PELVIS COMPARISON:  03/29/2014 FINDINGS: Patient has undergone left hip total arthroplasty. There is no evidence for subluxation or interval fracture on the frontal views provided. Normal  alignment of the femoral head and acetabular prosthetic components. IMPRESSION: Status post left hip arthroplasty.  No adverse features. Electronically Signed   By: Nolon Nations M.D.   On: 06/25/2016 14:21  Disposition: 01-Home or Self Care  Discharge Instructions    Call MD / Call 911    Complete by:  As directed    If you experience chest pain or shortness of breath, CALL 911 and be transported to the hospital emergency room.  If you develope a fever above 101 F, pus (white drainage) or increased drainage or redness at the wound, or calf pain, call your surgeon's office.   Constipation Prevention    Complete by:  As directed    Drink plenty of fluids.  Prune juice may be helpful.  You may use a stool softener, such as Colace (over the counter) 100 mg twice a day.  Use MiraLax (over the counter) for constipation as needed.   Diet - low sodium heart healthy    Complete by:  As directed    Driving restrictions    Complete by:  As directed    No driving for 2 weeks   Follow the hip precautions as taught in Physical Therapy    Complete by:  As directed    Increase activity slowly as tolerated    Complete by:  As directed    Patient may shower    Complete by:  As directed    You may shower without a dressing once there is no drainage.  Do not wash over the wound.  If drainage remains, cover wound with plastic wrap and then shower.      Follow-up Information    Kerin Salen, MD Follow up in 2 week(s).   Specialty:  Orthopedic Surgery Contact information: [place] [address] [facility] Follow up.   Specialty:  Home Health Services Why:  Someone from [facility] will contact you to arrange start date and time for therapy. Contact information: [place] [address] [phone]            Signed: Hardin Negus, Dajia Gunnels R 06/27/2016, 8:09 AM

## 2016-06-27 NOTE — Progress Notes (Signed)
Physical Therapy Treatment Patient Details Name: Preston [name] MRN: [id number] DOB: [birth year] Today's Date: 06/27/2016    History of Present Illness Pt admitted 11/27 for elective anterior L THA. Pt with h/o anxiety, ETOH abuse and arthritis    PT Comments    Pt admitted with above diagnosis. Pt currently without significant functional limitations and has met goals and no further inpatient needs.  D/C PT with pt d/c home today.  Follow Up Recommendations  Home health PT;Supervision/Assistance - 24 hour     Equipment Recommendations  Rolling walker with 5" wheels;3in1 (PT)    Recommendations for Other Services       Precautions / Restrictions Precautions Precautions: Fall Restrictions Weight Bearing Restrictions: Yes LLE Weight Bearing: Weight bearing as tolerated    Mobility  Bed Mobility               General bed mobility comments: Pt in recliner on arrival just finishing up with OT.    Transfers Overall transfer level: Needs assistance Equipment used: Rolling walker (2 wheeled) Transfers: Sit to/from Stand Sit to Stand: Supervision         General transfer comment: Able to get up and down without physical assist or cues.    Ambulation/Gait Ambulation/Gait assistance: Supervision Ambulation Distance (Feet): 400 Feet Assistive device: Rolling walker (2 wheeled) Gait Pattern/deviations: Step-through pattern;Decreased stance time - left;Antalgic Gait velocity: slow Gait velocity interpretation: Below normal speed for age/gender General Gait Details: Verbal cues for sequencing at times. Pt demonstrates good safety with Rw.     Stairs Stairs: Yes Stairs assistance: Supervision Stair Management: Two rails;Forwards;Step to pattern Number of Stairs: 10 General stair comments: Pt had no difficulty going up and down stairs.  Wife present to observe.  Also showed pt how to go up and down steps with RW backwards just in case pt comes upon some.    Wheelchair Mobility    Modified Rankin (Stroke Patients Only)       Balance Overall balance assessment: Needs assistance Sitting-balance support: No upper extremity supported;Feet supported Sitting balance-Leahy Scale: Good     Standing balance support: Bilateral upper extremity supported;During functional activity Standing balance-Leahy Scale: Fair Standing balance comment: can stand statically without UE support for up to a minute.               High level balance activites: Direction changes;Turns;Sudden stops High Level Balance Comments: supervision for above.    Cognition Arousal/Alertness: Awake/alert Behavior During Therapy: WFL for tasks assessed/performed Overall Cognitive Status: Within Functional Limits for tasks assessed                      Exercises Total Joint Exercises Ankle Circles/Pumps: AROM;Both;10 reps;Supine Quad Sets: AROM;Left;10 reps;Supine Short Arc Quad: AROM;Left;10 reps;Supine Heel Slides: AROM;Left;10 reps;Standing Hip ABduction/ADduction: AROM;Left;10 reps;Standing Long Arc Quad: AROM;Left;10 reps;Seated Marching in Standing: Left;10 reps;Standing Standing Hip Extension: AROM;Left;10 reps;Standing    General Comments        Pertinent Vitals/Pain Pain Assessment: 0-10 Pain Score: 6  Pain Location: left hip and right hip (will have right hip replaced as soon as able) Pain Descriptors / Indicators: Aching;Sore;Tightness Pain Intervention(s): Limited activity within patient's tolerance;Monitored during session;Repositioned;Patient requesting pain meds-RN notified  VSS    Home Living                      Prior Function            PT Goals (current goals can now  be found in the care plan section) Acute Rehab PT Goals Patient Stated Goal: home Progress towards PT goals: Progressing toward goals    Frequency    7X/week      PT Plan Current plan remains appropriate    Co-evaluation              End of Session Equipment Utilized During Treatment: Gait belt Activity Tolerance: Patient tolerated treatment well Patient left: with call bell/phone within reach;in chair;with family/visitor present     Time: 3958-4417 PT Time Calculation (min) (ACUTE ONLY): 30 min  Charges:  $Gait Training: 8-22 mins $Therapeutic Exercise: 8-22 mins                    G Codes:      Godfrey Pick Shay Bartoli 07/17/2016, 10:09 AM [facility] [phone] (pager)

## 2016-07-04 ENCOUNTER — Other Ambulatory Visit (HOSPITAL_COMMUNITY): Payer: Self-pay | Admitting: Orthopedic Surgery

## 2016-07-04 DIAGNOSIS — M79605 Pain in left leg: Secondary | ICD-10-CM

## 2016-07-04 DIAGNOSIS — M7989 Other specified soft tissue disorders: Principal | ICD-10-CM

## 2016-07-05 ENCOUNTER — Encounter (HOSPITAL_COMMUNITY): Payer: Medicaid Other

## 2016-08-23 ENCOUNTER — Other Ambulatory Visit: Payer: Self-pay | Admitting: Orthopedic Surgery

## 2016-08-29 ENCOUNTER — Encounter (HOSPITAL_COMMUNITY): Payer: Self-pay

## 2016-08-29 ENCOUNTER — Encounter: Payer: Self-pay | Admitting: Internal Medicine

## 2016-08-29 ENCOUNTER — Ambulatory Visit: Payer: Medicaid Other | Attending: Internal Medicine | Admitting: Internal Medicine

## 2016-08-29 ENCOUNTER — Encounter (HOSPITAL_COMMUNITY)
Admission: RE | Admit: 2016-08-29 | Discharge: 2016-08-29 | Disposition: A | Discharge status: Home / Self Care | Payer: Medicaid Other | Source: Ambulatory Visit | Attending: Orthopedic Surgery | Admitting: Orthopedic Surgery

## 2016-08-29 ENCOUNTER — Other Ambulatory Visit (HOSPITAL_COMMUNITY): Payer: Self-pay | Admitting: *Deleted

## 2016-08-29 VITALS — BP 136/91 | HR 76 | Temp 97.9°F | Ht 74.0 in | Wt 298.4 lb

## 2016-08-29 DIAGNOSIS — E7800 Pure hypercholesterolemia, unspecified: Secondary | ICD-10-CM | POA: Insufficient documentation

## 2016-08-29 DIAGNOSIS — Z01818 Encounter for other preprocedural examination: Secondary | ICD-10-CM | POA: Insufficient documentation

## 2016-08-29 DIAGNOSIS — M109 Gout, unspecified: Secondary | ICD-10-CM | POA: Insufficient documentation

## 2016-08-29 DIAGNOSIS — M1990 Unspecified osteoarthritis, unspecified site: Secondary | ICD-10-CM | POA: Insufficient documentation

## 2016-08-29 DIAGNOSIS — Z7982 Long term (current) use of aspirin: Secondary | ICD-10-CM | POA: Insufficient documentation

## 2016-08-29 DIAGNOSIS — G894 Chronic pain syndrome: Secondary | ICD-10-CM | POA: Diagnosis not present

## 2016-08-29 DIAGNOSIS — I110 Hypertensive heart disease with heart failure: Secondary | ICD-10-CM | POA: Diagnosis not present

## 2016-08-29 DIAGNOSIS — Z79899 Other long term (current) drug therapy: Secondary | ICD-10-CM | POA: Insufficient documentation

## 2016-08-29 DIAGNOSIS — K219 Gastro-esophageal reflux disease without esophagitis: Secondary | ICD-10-CM | POA: Diagnosis not present

## 2016-08-29 DIAGNOSIS — I10 Essential (primary) hypertension: Secondary | ICD-10-CM

## 2016-08-29 DIAGNOSIS — I509 Heart failure, unspecified: Secondary | ICD-10-CM | POA: Diagnosis not present

## 2016-08-29 DIAGNOSIS — Z885 Allergy status to narcotic agent status: Secondary | ICD-10-CM | POA: Insufficient documentation

## 2016-08-29 DIAGNOSIS — F17200 Nicotine dependence, unspecified, uncomplicated: Secondary | ICD-10-CM

## 2016-08-29 DIAGNOSIS — J449 Chronic obstructive pulmonary disease, unspecified: Secondary | ICD-10-CM | POA: Diagnosis not present

## 2016-08-29 DIAGNOSIS — Z96642 Presence of left artificial hip joint: Secondary | ICD-10-CM | POA: Diagnosis not present

## 2016-08-29 DIAGNOSIS — F411 Generalized anxiety disorder: Secondary | ICD-10-CM

## 2016-08-29 HISTORY — DX: Dyspnea, unspecified: R06.00

## 2016-08-29 LAB — TYPE AND SCREEN
ABO/RH(D): A POS
Antibody Screen: NEGATIVE

## 2016-08-29 LAB — CBC WITH DIFFERENTIAL/PLATELET
BASOS PCT: 0 %
Basophils Absolute: 0.1 10*3/uL (ref 0.0–0.1)
Eosinophils Absolute: 0.4 10*3/uL (ref 0.0–0.7)
Eosinophils Relative: 3 %
HEMATOCRIT: 45.2 % (ref 39.0–52.0)
HEMOGLOBIN: 15.2 g/dL (ref 13.0–17.0)
LYMPHS PCT: 19 %
Lymphs Abs: 2.3 10*3/uL (ref 0.7–4.0)
MCH: 30.6 pg (ref 26.0–34.0)
MCHC: 33.6 g/dL (ref 30.0–36.0)
MCV: 91.1 fL (ref 78.0–100.0)
MONO ABS: 0.8 10*3/uL (ref 0.1–1.0)
MONOS PCT: 6 %
NEUTROS ABS: 8.8 10*3/uL — AB (ref 1.7–7.7)
NEUTROS PCT: 72 %
Platelets: 297 10*3/uL (ref 150–400)
RBC: 4.96 MIL/uL (ref 4.22–5.81)
RDW: 13.2 % (ref 11.5–15.5)
WBC: 12.3 10*3/uL — ABNORMAL HIGH (ref 4.0–10.5)

## 2016-08-29 LAB — BASIC METABOLIC PANEL
ANION GAP: 13 (ref 5–15)
BUN: 8 mg/dL (ref 6–20)
CALCIUM: 10.2 mg/dL (ref 8.9–10.3)
CHLORIDE: 101 mmol/L (ref 101–111)
CO2: 23 mmol/L (ref 22–32)
Creatinine, Ser: 0.77 mg/dL (ref 0.61–1.24)
GFR calc non Af Amer: >60 mL/min (ref >60)
GLUCOSE: 245 mg/dL — AB (ref 65–99)
POTASSIUM: 4.5 mmol/L (ref 3.5–5.1)
Sodium: 137 mmol/L (ref 135–145)

## 2016-08-29 LAB — URINALYSIS, ROUTINE W REFLEX MICROSCOPIC
Bilirubin Urine: NEGATIVE
Glucose, UA: 50 mg/dL — AB
Hgb urine dipstick: NEGATIVE
Ketones, ur: NEGATIVE mg/dL
Nitrite: NEGATIVE
PROTEIN: NEGATIVE mg/dL
SPECIFIC GRAVITY, URINE: 1.013 (ref 1.005–1.030)
SQUAMOUS EPITHELIAL / LPF: NONE SEEN
pH: 6 (ref 5.0–8.0)

## 2016-08-29 LAB — SURGICAL PCR SCREEN
MRSA, PCR: NEGATIVE
Staphylococcus aureus: POSITIVE — AB

## 2016-08-29 LAB — PROTIME-INR
INR: 0.97
Prothrombin Time: 12.8 seconds (ref 11.4–15.2)

## 2016-08-29 LAB — APTT: aPTT: 29 seconds (ref 24–36)

## 2016-08-29 MED ORDER — ACETAMINOPHEN-CODEINE #3 300-30 MG PO TABS: 1.0000 | ORAL_TABLET | ORAL | 0 refills | Status: DC | PRN

## 2016-08-29 MED ORDER — DIAZEPAM 5 MG PO TABS: 5.0000 mg | ORAL_TABLET | Freq: Every day | ORAL | 0 refills | Status: DC | PRN

## 2016-08-29 NOTE — Progress Notes (Signed)
Mupirocin Ointment Rx called into [facility] at [facility] for positive PCR of Staph. Pt's wife, Anderson [place] notified and voiced understanding.

## 2016-08-29 NOTE — Pre-Procedure Instructions (Signed)
[place]  08/29/2016    Your procedure is scheduled on Monday, September 10, 2016 at 8:45 AM.   Report to [facility] Entrance "A" Admitting Office at 6:45 AM.   Call this number if you have problems the morning of surgery: [phone]   Questions prior to day of surgery, please call [phone] between 8 & 4 PM.   Remember:  Do not eat food or drink liquids after midnight Sunday, 09/09/16.  Take these medicines the morning of surgery with A SIP OF WATER: Famotidine (Cymbalta), Gabapentin (Neurontin), Metoprolol (Lopressor), Oxycodone - if needed, Diazepam (Valium) - if needed, Albuterol inhaler - if needed (bring inhaler with you day of surgery)  Stop Aspirin as instructed by physician/surgeon.   Do not wear jewelry.  Do not wear lotions, powders, or cologne.  Men may shave face and neck.  Do not bring valuables to the hospital.  [facility] is not responsible for any belongings or valuables.  Contacts, dentures or bridgework may not be worn into surgery.  Leave your suitcase in the car.  After surgery it may be brought to your room.  For patients admitted to the hospital, discharge time will be determined by your treatment team.  Special instructions:  [facility] - Preparing for Surgery  Before surgery, you can play an important role.  Because skin is not sterile, your skin needs to be as free of germs as possible.  You can reduce the number of germs on you skin by washing with CHG (chlorahexidine gluconate) soap before surgery.  CHG is an antiseptic cleaner which kills germs and bonds with the skin to continue killing germs even after washing.  Please DO NOT use if you have an allergy to CHG or antibacterial soaps.  If your skin becomes reddened/irritated stop using the CHG and inform your nurse when you arrive at Short Stay.  Do not shave (including legs and underarms) for at least 48 hours prior to the first CHG shower.  You may shave your face.  Please  follow these instructions carefully:   1.  Shower with CHG Soap the night before surgery and the                    morning of Surgery.  2.  If you choose to wash your hair, wash your hair first as usual with your       normal shampoo.  3.  After you shampoo, rinse your hair and body thoroughly to remove the shampoo.  4.  Use CHG as you would any other liquid soap.  You can apply chg directly       to the skin and wash gently with scrungie or a clean washcloth.  5.  Apply the CHG Soap to your body ONLY FROM THE NECK DOWN.        Do not use on open wounds or open sores.  Avoid contact with your eyes, ears, mouth and genitals (private parts).  Wash genitals (private parts) with your normal soap.  6.  Wash thoroughly, paying special attention to the area where your surgery        will be performed.  7.  Thoroughly rinse your body with warm water from the neck down.  8.  DO NOT shower/wash with your normal soap after using and rinsing off       the CHG Soap.  9.  Pat yourself dry with a clean towel.  10.  Wear clean pajamas.            11.  Place clean sheets on your bed the night of your first shower and do not        sleep with pets.  Day of Surgery  Do not apply any lotions the morning of surgery.  Please wear clean clothes to the hospital.   Please read over the fact sheets that you were given.

## 2016-08-29 NOTE — Patient Instructions (Signed)
Hip Pain  Your hip is the joint between your upper legs and your lower pelvis. The bones, cartilage, tendons, and muscles of your hip joint perform a lot of work each day supporting your body weight and allowing you to move around.  Hip pain can range from a minor ache to severe pain in one or both of your hips. Pain may be felt on the inside of the hip joint near the groin, or the outside near the buttocks and upper thigh. You may have swelling or stiffness as well.  Follow these instructions at home:  ? Take medicines only as directed by your health care provider.  ? Apply ice to the injured area:  ? Put ice in a plastic bag.  ? Place a towel between your skin and the bag.  ? Leave the ice on for 15?20 minutes at a time, 3?4 times a day.  ? Keep your leg raised (elevated) when possible to lessen swelling.  ? Avoid activities that cause pain.  ? Follow specific exercises as directed by your health care provider.  ? Sleep with a pillow between your legs on your most comfortable side.  ? Record how often you have hip pain, the location of the pain, and what it feels like.  Contact a health care provider if:  ? You are unable to put weight on your leg.  ? Your hip is red or swollen or very tender to touch.  ? Your pain or swelling continues or worsens after 1 week.  ? You have increasing difficulty walking.  ? You have a fever.  Get help right away if:  ? You have fallen.  ? You have a sudden increase in pain and swelling in your hip.  This information is not intended to replace advice given to you by your health care provider. Make sure you discuss any questions you have with your health care provider.  Document Released: 01/03/2010 Document Revised: 12/22/2015 Document Reviewed: 03/12/2013  Elsevier Interactive Patient Education ? 2017 [employer]

## 2016-08-29 NOTE — Progress Notes (Signed)
Preston [name], is a 46 y.o. male  [id number]  [id number]  DOB - Apr 19, 1971  Chief Complaint  Patient presents with  . Medication Refill       Subjective:   Preston [name] is a 46 y.o. 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 Patient has night pain, worsening of pain with activity and weight bearing on the right, pain that interfers with activities of daily living and pain with passive range of motion. He is scheduled for right hip replacement on 09/10/2016. Anxiety is better managed, pain is improved on left hip and ambulation is less painful on left. He has no new complaint today. He needs refill of his pain medication. Patient has No headache, No chest pain, No abdominal pain - No Nausea, No new weakness tingling or numbness, No Cough - SOB.  No problems updated.  ALLERGIES: Allergies  Allergen Reactions  . Adhesive [Tape] Itching and Rash    Please use "paper" tape.  . Morphine And Related Itching, Nausea And Vomiting and Other (See Comments)    "bad feeling"   PAST MEDICAL HISTORY: Past Medical History:  Diagnosis Date  . Alcohol abuse   . Anxiety   . Arthritis    "hips, knees, back" (03/11/2015)  . Asthma    reports related to anxiety  . CHF (congestive heart failure) ([place])   . Chronic bronchitis ([place])   . Chronic hip pain    "both"  . Chronic lower back pain   . COPD (chronic obstructive pulmonary disease) ([place])   . Diverticulitis   . Diverticulitis   . DJD (degenerative joint disease)   . Dyspnea    with exertion   . GERD (gastroesophageal reflux disease)   . Gout   . History of Clostridium difficile infection   . Hypercholesterolemia   . Hypertension    Does not see a cardiologist  . Migraine    "new onset" (03/11/2015)  . Panic attacks   . Pneumonia   .  Poor dentition    MEDICATIONS AT HOME: Prior to Admission medications   Medication Sig Start Date End Date Taking? Authorizing Provider  albuterol (PROVENTIL HFA;VENTOLIN HFA) 108 (90 Base) MCG/ACT inhaler Inhale 1-2 puffs into the lungs every 6 (six) hours as needed for wheezing or shortness of breath. Patient taking differently: Inhale 2 puffs into the lungs every 6 (six) hours as needed for wheezing or shortness of breath.  03/29/16  Yes Tresa Garter, MD  allopurinol (ZYLOPRIM) 300 MG tablet Take 1 tablet (300 mg total) by mouth every evening. 03/29/16  Yes Decklyn Hornik E Doreene Burke, MD  amLODipine (NORVASC) 5 MG tablet Take 1 tablet (5 mg total) by mouth daily. Patient taking differently: Take 5 mg by mouth every evening.  03/29/16  Yes Tresa Garter, MD  aspirin EC 325 MG tablet Take 1 tablet (325 mg total) by mouth 2 (two) times daily. 06/25/16  Yes Leighton Parody, PA-C  atorvastatin (LIPITOR) 40 MG tablet Take 1 tablet (40 mg total) by mouth every evening. 03/29/16  Yes Tresa Garter, MD  calcium carbonate (TUMS - DOSED IN MG ELEMENTAL CALCIUM) 500 MG chewable tablet Chew 2 tablets by mouth daily as needed for indigestion or heartburn.   Yes Historical Provider, MD  colchicine 0.6 MG tablet Take 1 tablet (0.6 mg total) by mouth daily  as needed. Patient taking differently: Take 0.6 mg by mouth daily as needed (for gout).  03/29/16  Yes Edrik Rundle Essie Christine, MD  diazepam (VALIUM) 5 MG tablet Take 1 tablet (5 mg total) by mouth daily as needed for anxiety. 08/29/16  Yes Tresa Garter, MD  DULoxetine (CYMBALTA) 30 MG capsule Take 1 capsule (30 mg total) by mouth daily. Patient taking differently: Take 30 mg by mouth every evening.  03/29/16  Yes Tresa Garter, MD  famotidine (PEPCID) 20 MG tablet Take 1 tablet (20 mg total) by mouth daily. 03/29/16  Yes Tresa Garter, MD  furosemide (LASIX) 40 MG tablet Take 1 tablet (40 mg total) by mouth daily. 03/29/16  Yes Tresa Garter, MD  gabapentin (NEURONTIN) 300 MG capsule Take 1 capsule (300 mg total) by mouth 3 (three) times daily. Patient taking differently: Take 300 mg by mouth 2 (two) times daily.  03/29/16  Yes Tresa Garter, MD  hydrOXYzine (ATARAX/VISTARIL) 25 MG tablet Take 1 tablet (25 mg total) by mouth 3 (three) times daily as needed for anxiety. Patient taking differently: Take 25 mg by mouth at bedtime as needed for anxiety.  03/29/16  Yes Tresa Garter, MD  lisinopril (PRINIVIL,ZESTRIL) 40 MG tablet Take 1 tablet (40 mg total) by mouth daily. Patient taking differently: Take 40 mg by mouth every evening.  03/29/16  Yes Tresa Garter, MD  metoprolol (LOPRESSOR) 50 MG tablet Take 1 tablet (50 mg total) by mouth 2 (two) times daily. 03/29/16  Yes Luana Tatro Essie Christine, MD  tiZANidine (ZANAFLEX) 2 MG tablet Take 1 tablet (2 mg total) by mouth every 6 (six) hours as needed for muscle spasms. 06/25/16  Yes Leighton Parody, PA-C  acetaminophen-codeine (TYLENOL #3) 300-30 MG tablet Take 1 tablet by mouth every 4 (four) hours as needed. 08/29/16   Tresa Garter, MD    Objective:   Vitals:   08/29/16 1142  BP: (!) 136/91  Pulse: 76  Temp: 97.9 F (36.6 C)  TempSrc: Oral  SpO2: 96%  Weight: 298 lb 6.4 oz (135.4 kg)  Height: 6\' 2"  (1.88 m)   Exam General appearance : Awake, alert, not in any distress. Speech Clear. Not toxic looking HEENT: Atraumatic and Normocephalic, pupils equally reactive to light and accomodation Neck: Supple, no JVD. No cervical lymphadenopathy.  Chest: Good air entry bilaterally, no added sounds  CVS: S1 S2 regular, no murmurs.  Abdomen: Bowel sounds present, Non tender and not distended with no gaurding, rigidity or rebound. Extremities: B/L Lower Ext shows no edema, both legs are warm to touch Neurology: Awake alert, and oriented X 3, CN II-XII intact, Non focal Skin: No Rash  Data Review Lab Results  Component Value Date   HGBA1C 5.6 03/12/2015    HGBA1C 5.6 08/05/2013   Assessment & Plan   1. Generalized anxiety disorder  - diazepam (VALIUM) 5 MG tablet; Take 1 tablet (5 mg total) by mouth daily as needed for anxiety.  Dispense: 30 tablet; Refill: 0  2. Essential hypertension, benign  We have discussed target BP range and blood pressure goal. I have advised patient to check BP regularly and to call us back or report to clinic if the numbers are consistently higher than 140/90. We discussed the importance of compliance with medical therapy and DASH diet recommended, consequences of uncontrolled hypertension discussed.  - continue current BP medications  3. Chronic pain syndrome  - acetaminophen-codeine (TYLENOL #3) 300-30 MG tablet; Take 1 tablet by  mouth every 4 (four) hours as needed.  Dispense: 60 tablet; Refill: 0  4. Tobacco use disorder Jocob was counseled on the dangers of tobacco use, and was advised to quit. Reviewed strategies to maximize success, including removing cigarettes and smoking materials from environment, stress management and support of family/friends.  Patient have been counseled extensively about nutrition and exercise. Other issues discussed during this visit include: low cholesterol diet, weight control and daily exercise, importance of adherence with medications and regular follow-up. We also discussed long term complications of uncontrolled hypertension.   Return in about 3 months (around 11/26/2016) for Follow up Pain and comorbidities, Follow up HTN.  The patient was given clear instructions to go to ER or return to medical center if symptoms don't improve, worsen or new problems develop. The patient verbalized understanding. The patient was told to call to get lab results if they haven't heard anything in the next week.   This note has been created with Surveyor, quantity. Any transcriptional errors are unintentional.    Angelica Chessman, MD, [place], [place],  [place], [place] and [place], [place]   08/29/2016, 12:09 PM

## 2016-08-29 NOTE — Progress Notes (Signed)
   08/29/16 0938  OBSTRUCTIVE SLEEP APNEA  Have you ever been diagnosed with sleep apnea through a sleep study? No  Do you snore loudly (loud enough to be heard through closed doors)?  1  Do you often feel tired, fatigued, or sleepy during the daytime (such as falling asleep during driving or talking to someone)? 0  Has anyone observed you stop breathing during your sleep? 1  Do you have, or are you being treated for high blood pressure? 1  BMI more than 35 kg/m2? 1  Age > 50 (1-yes) 0  Neck circumference greater than:Male 16 inches or larger, Male 17inches or larger? 1  Male Gender (Yes=1) 1  Obstructive Sleep Apnea Score 6  Score 5 or greater  Results sent to PCP

## 2016-08-29 NOTE — Progress Notes (Signed)
Pt has hx of CHF. Followed by his PCP, Dr. Doreene Burke. Denies any recent chest pain, sob or fluid buildup. Echo done prior to surgery in November, 2017. Pt states he was tested for sleep apnea within the last 2 years and was told he didn't have it. Stopbang tool is positive for sleep apnea, sent it to Dr. Doreene Burke.

## 2016-08-29 NOTE — Progress Notes (Signed)
Pt is here today for medication refills. Pt is also having pain in hip and feet.   Pt needs refills [id number] #3, valium,

## 2016-08-30 ENCOUNTER — Telehealth: Payer: Self-pay | Admitting: Internal Medicine

## 2016-08-30 NOTE — Telephone Encounter (Signed)
Patient's wife called the office to speak with PCP or nurse regarding patient's lab results. Specialist is needing results of patient since he will be undergoing a surgery on 2/12. They needs to make sure his blood sugar levels are ok. Please follow up.  Thank you.

## 2016-08-31 NOTE — Telephone Encounter (Signed)
MA spoke with patients wife and informed her of PCP reviewing patients pre-admit lab results. Patient does not need treatment for UTI at this time. Patient advised to control his eating habits prior to the procedure to avoid any delays. Patient and wife expressed their understanding and had no further questions at this time.

## 2016-09-07 DIAGNOSIS — M1611 Unilateral primary osteoarthritis, right hip: Secondary | ICD-10-CM | POA: Diagnosis present

## 2016-09-07 MED ORDER — DEXTROSE 5 % IV SOLN
3.0000 g | INTRAVENOUS | Status: AC
  Administered 2016-09-10: 3 g via INTRAVENOUS
  Filled 2016-09-07: qty 3000

## 2016-09-07 MED ORDER — DEXTROSE-NACL 5-0.45 % IV SOLN: INTRAVENOUS | Status: DC

## 2016-09-07 MED ORDER — BUPIVACAINE LIPOSOME 1.3 % IJ SUSP
20.0000 mL | Freq: Once | INTRAMUSCULAR | Status: AC
  Administered 2016-09-10: 20 mL
  Filled 2016-09-07: qty 20

## 2016-09-07 NOTE — H&P (Signed)
TOTAL HIP ADMISSION H&P  Patient is admitted for right total hip arthroplasty.  Subjective:  Chief Complaint: right hip pain  HPI: Preston [name], 46 y.o. male, has a history of pain and functional disability in the right hip(s) due to arthritis and patient has failed non-surgical conservative treatments for greater than 12 weeks to include NSAID's and/or analgesics, flexibility and strengthening excercises, use of assistive devices and activity modification.  Onset of symptoms was gradual starting several years ago with gradually worsening course since that time.The patient noted no past surgery on the right hip(s).  Patient currently rates pain in the right hip at 10 out of 10 with activity. Patient has night pain, worsening of pain with activity and weight bearing, trendelenberg gait, pain that interfers with activities of daily living and pain with passive range of motion. Patient has evidence of periarticular osteophytes and joint space narrowing by imaging studies. This condition presents safety issues increasing the risk of falls.   There is no current active infection.  Patient Active Problem List   Diagnosis Date Noted  . Primary localized osteoarthritis of left hip 06/25/2016  . Primary osteoarthritis of left hip 06/24/2016  . Osteoarthritis of right hip 04/03/2016  . Tobacco use disorder 10/13/2015  . Chronic hip pain 07/14/2015  . Gastroesophageal reflux disease without esophagitis 07/14/2015  . Essential hypertension, benign 03/24/2015  . Chronic diastolic congestive heart failure ([place]) 03/24/2015  . Morbid obesity ([place]) 03/24/2015  . Nicotine abuse 03/24/2015  . Generalized anxiety disorder 03/24/2015  . Primary gout 03/24/2015  . Chronic pain syndrome 03/24/2015  . COPD (chronic obstructive pulmonary disease) ([place])   . HLD (hyperlipidemia) 03/11/2015  . GERD (gastroesophageal reflux disease) 03/11/2015  . Depression 03/11/2015  . SOB (shortness of breath) 03/11/2015  .  Bilateral leg edema 03/11/2015  . Alcohol abuse 03/11/2015  . Elevated troponin 03/11/2015  . Diverticulitis large intestine 07/08/2013  . Personal history of colonic polyps 03/10/2013  . Diverticulitis 02/26/2013  . Diverticulosis 02/03/2013  . Tobacco abuse 12/30/2012  . Gout    Past Medical History:  Diagnosis Date  . Alcohol abuse   . Anxiety   . Arthritis    "hips, knees, back" (03/11/2015)  . Asthma    reports related to anxiety  . CHF (congestive heart failure) ([place])   . Chronic bronchitis ([place])   . Chronic hip pain    "both"  . Chronic lower back pain   . COPD (chronic obstructive pulmonary disease) ([place])   . Diverticulitis   . Diverticulitis   . DJD (degenerative joint disease)   . Dyspnea    with exertion   . GERD (gastroesophageal reflux disease)   . Gout   . History of Clostridium difficile infection   . Hypercholesterolemia   . Hypertension    Does not see a cardiologist  . Migraine    "new onset" (03/11/2015)  . Panic attacks   . Pneumonia   . Poor dentition     Past Surgical History:  Procedure Laterality Date  . APPENDECTOMY    . COLON SURGERY     14 inches of colon removed  . COLONOSCOPY WITH PROPOFOL N/A 04/01/2013   Procedure: COLONOSCOPY WITH PROPOFOL;  Surgeon: Arta Silence, MD;  Location: [facility] ENDOSCOPY;  Service: Endoscopy;  Laterality: N/A;  . FLEXIBLE SIGMOIDOSCOPY N/A 02/28/2013   Procedure: FLEXIBLE SIGMOIDOSCOPY;  Surgeon: Winfield Cunas., MD;  Location: [facility] ENDOSCOPY;  Service: Endoscopy;  Laterality: N/A;  . LAPAROSCOPIC LOW ANTERIOR RESECTION N/A 07/08/2013  Procedure: LAPAROSCOPIC LOW ANTERIOR RESECTION WITH TAKEDOWN OF SPLENIC FLEXURE.;  Surgeon: Adin Hector, MD;  Location: [facility];  Service: General;  Laterality: N/A;  . MANDIBLE SURGERY Left 2010   "dry skin pocket cut out"  . TOTAL HIP ARTHROPLASTY Left 06/25/2016   Procedure: TOTAL HIP ARTHROPLASTY ANTERIOR APPROACH;  Surgeon: Frederik Pear, MD;  Location: [facility];  Service:  Orthopedics;  Laterality: Left;    No prescriptions prior to admission.   Allergies  Allergen Reactions  . Adhesive [Tape] Itching and Rash    Please use "paper" tape.  . Morphine And Related Itching, Nausea And Vomiting and Other (See Comments)    "bad feeling"    Social History  Substance Use Topics  . Smoking status: Former Smoker    Packs/day: 1.00    Years: 30.00    Types: Cigarettes    Quit date: 06/11/2016  . Smokeless tobacco: Former Systems developer    Types: Chew     Comment: "quit chewing in the 1990's"  . Alcohol use 3.6 oz/week    6 Cans of beer per week     Comment: 6 beers a day, as of 08/29/16 down to 2 beers    Family History  Problem Relation Age of Onset  . Cancer Father     lymphoma  . Heart attack Mother      Review of Systems  Constitutional: Positive for diaphoresis and malaise/fatigue.  HENT: Negative.   Eyes: Negative.   Respiratory: Positive for shortness of breath.   Cardiovascular: Positive for leg swelling.       Htn  Gastrointestinal: Negative.   Genitourinary: Negative.   Musculoskeletal: Positive for joint pain.  Neurological: Positive for dizziness and focal weakness.  Endo/Heme/Allergies: Negative.   Psychiatric/Behavioral: The patient is nervous/anxious.     Objective:  Physical Exam  Constitutional: He is oriented to person, place, and time. He appears well-developed and well-nourished.  HENT:  Head: Normocephalic and atraumatic.  Eyes: Pupils are equal, round, and reactive to light.  Neck: Normal range of motion. Neck supple.  Cardiovascular: Intact distal pulses.   Respiratory: Effort normal.  Musculoskeletal: He exhibits tenderness.  patient has severe pain with any movement of his hips.  He has very limited motion with internal and external rotation.  He can only internally rotate to approximately 5.  Discomfort with flexion and extension of the left hip.  His calves are soft and nontender.  He is neurovascularly intact distally.   Neurological: He is alert and oriented to person, place, and time.  Skin: Skin is warm and dry.  Psychiatric: He has a normal mood and affect. His behavior is normal. Judgment and thought content normal.    Vital signs in last 24 hours:    Labs:   Estimated body mass index is 38.31 kg/m as calculated from the following:   Height as of 08/29/16: 6\' 2"  (1.88 m).   Weight as of 08/29/16: 135.4 kg (298 lb 6.4 oz).   Imaging Review Plain radiographs demonstrate  AP of the pelvis and crosstable lateral of the right and left hips are taken and reviewed in office today.  the patient has osteoarthritis of the right hip with flattening of his femoral head.  Assessment/Plan:  End stage arthritis, right hip(s)  The patient history, physical examination, clinical judgement of the provider and imaging studies are consistent with end stage degenerative joint disease of the right hip(s) and total hip arthroplasty is deemed medically necessary. The treatment options including medical management,  injection therapy, arthroscopy and arthroplasty were discussed at length. The risks and benefits of total hip arthroplasty were presented and reviewed. The risks due to aseptic loosening, infection, stiffness, dislocation/subluxation,  thromboembolic complications and other imponderables were discussed.  The patient acknowledged the explanation, agreed to proceed with the plan and consent was signed. Patient is being admitted for inpatient treatment for surgery, pain control, PT, OT, prophylactic antibiotics, VTE prophylaxis, progressive ambulation and ADL's and discharge planning.The patient is planning to be discharged home with home health services

## 2016-09-09 MED ORDER — TRANEXAMIC ACID 1000 MG/10ML IV SOLN
1000.0000 mg | INTRAVENOUS | Status: AC
  Administered 2016-09-10: 1000 mg via INTRAVENOUS
  Filled 2016-09-09: qty 10

## 2016-09-09 MED ORDER — TRANEXAMIC ACID 1000 MG/10ML IV SOLN
2000.0000 mg | Freq: Once | INTRAVENOUS | Status: DC
  Filled 2016-09-09: qty 20

## 2016-09-09 NOTE — Anesthesia Preprocedure Evaluation (Addendum)
Anesthesia Evaluation  Patient identified by MRN, date of birth, ID band Patient awake    Reviewed: Allergy & Precautions, NPO status , Patient's Chart, lab work & pertinent test results  History of Anesthesia Complications Negative for: history of anesthetic complications  Airway Mallampati: II  TM Distance: >3 FB Neck ROM: Full    Dental  (+) Poor Dentition, Edentulous Upper, Missing,    Pulmonary asthma , COPD, Current Smoker, former smoker,    Pulmonary exam normal        Cardiovascular hypertension, Pt. on medications and Pt. on home beta blockers +CHF  Normal cardiovascular exam  Study Conclusions  - Left ventricle: The cavity size was normal. Systolic function was   normal. The estimated ejection fraction was in the range of 55%   to 60%. Wall motion was normal; there were no regional wall   motion abnormalities. Doppler parameters are consistent with   abnormal left ventricular relaxation (grade 1 diastolic   dysfunction). Doppler parameters are consistent with   indeterminate ventricular filling pressure.   Neuro/Psych Anxiety Depression    GI/Hepatic GERD  Medicated and Controlled,(+)     substance abuse  alcohol use,   Endo/Other  Morbid obesity  Renal/GU negative Renal ROS     Musculoskeletal  (+) Arthritis ,   Abdominal   Peds  Hematology   Anesthesia Other Findings Full Beard.  Several broken and decaying teeth below.  Reproductive/Obstetrics                           Anesthesia Physical  Anesthesia Plan  ASA: III  Anesthesia Plan: Spinal   Post-op Pain Management:    Induction: Intravenous  Airway Management Planned: Simple Face Mask  Additional Equipment:   Intra-op Plan:   Post-operative Plan:   Informed Consent: I have reviewed the patients History and Physical, chart, labs and discussed the procedure including the risks, benefits and alternatives for  the proposed anesthesia with the patient or authorized representative who has indicated his/her understanding and acceptance.   Dental advisory given  Plan Discussed with: CRNA, Anesthesiologist and Surgeon  Anesthesia Plan Comments:        Anesthesia Quick Evaluation

## 2016-09-10 ENCOUNTER — Encounter (HOSPITAL_COMMUNITY): Payer: Self-pay | Admitting: Anesthesiology

## 2016-09-10 ENCOUNTER — Inpatient Hospital Stay (HOSPITAL_COMMUNITY): Payer: Medicaid Other

## 2016-09-10 ENCOUNTER — Inpatient Hospital Stay (HOSPITAL_COMMUNITY): Payer: Medicaid Other | Admitting: Anesthesiology

## 2016-09-10 ENCOUNTER — Inpatient Hospital Stay (HOSPITAL_COMMUNITY)
Admission: RE | Admit: 2016-09-10 | Discharge: 2016-09-11 | DRG: 470 | Disposition: A | Discharge status: Home / Self Care | Payer: Medicaid Other | Source: Ambulatory Visit | Attending: Orthopedic Surgery | Admitting: Orthopedic Surgery

## 2016-09-10 ENCOUNTER — Encounter (HOSPITAL_COMMUNITY)
Admission: RE | Disposition: A | Discharge status: Home / Self Care | Payer: Self-pay | Source: Ambulatory Visit | Attending: Orthopedic Surgery

## 2016-09-10 DIAGNOSIS — M1000 Idiopathic gout, unspecified site: Secondary | ICD-10-CM | POA: Diagnosis present

## 2016-09-10 DIAGNOSIS — J42 Unspecified chronic bronchitis: Secondary | ICD-10-CM | POA: Diagnosis present

## 2016-09-10 DIAGNOSIS — Z86010 Personal history of colonic polyps: Secondary | ICD-10-CM | POA: Diagnosis not present

## 2016-09-10 DIAGNOSIS — M25751 Osteophyte, right hip: Secondary | ICD-10-CM | POA: Diagnosis present

## 2016-09-10 DIAGNOSIS — I5032 Chronic diastolic (congestive) heart failure: Secondary | ICD-10-CM | POA: Diagnosis present

## 2016-09-10 DIAGNOSIS — I110 Hypertensive heart disease with heart failure: Secondary | ICD-10-CM | POA: Diagnosis present

## 2016-09-10 DIAGNOSIS — Z7982 Long term (current) use of aspirin: Secondary | ICD-10-CM

## 2016-09-10 DIAGNOSIS — Z96642 Presence of left artificial hip joint: Secondary | ICD-10-CM | POA: Diagnosis present

## 2016-09-10 DIAGNOSIS — F411 Generalized anxiety disorder: Secondary | ICD-10-CM | POA: Diagnosis present

## 2016-09-10 DIAGNOSIS — Z885 Allergy status to narcotic agent status: Secondary | ICD-10-CM | POA: Diagnosis not present

## 2016-09-10 DIAGNOSIS — M170 Bilateral primary osteoarthritis of knee: Secondary | ICD-10-CM | POA: Diagnosis present

## 2016-09-10 DIAGNOSIS — Z91048 Other nonmedicinal substance allergy status: Secondary | ICD-10-CM | POA: Diagnosis not present

## 2016-09-10 DIAGNOSIS — K219 Gastro-esophageal reflux disease without esophagitis: Secondary | ICD-10-CM | POA: Diagnosis present

## 2016-09-10 DIAGNOSIS — Z419 Encounter for procedure for purposes other than remedying health state, unspecified: Secondary | ICD-10-CM

## 2016-09-10 DIAGNOSIS — M1611 Unilateral primary osteoarthritis, right hip: Secondary | ICD-10-CM | POA: Diagnosis present

## 2016-09-10 DIAGNOSIS — F329 Major depressive disorder, single episode, unspecified: Secondary | ICD-10-CM | POA: Diagnosis present

## 2016-09-10 DIAGNOSIS — Z8619 Personal history of other infectious and parasitic diseases: Secondary | ICD-10-CM

## 2016-09-10 DIAGNOSIS — E7800 Pure hypercholesterolemia, unspecified: Secondary | ICD-10-CM | POA: Diagnosis present

## 2016-09-10 DIAGNOSIS — G894 Chronic pain syndrome: Secondary | ICD-10-CM | POA: Diagnosis present

## 2016-09-10 DIAGNOSIS — Z8249 Family history of ischemic heart disease and other diseases of the circulatory system: Secondary | ICD-10-CM | POA: Diagnosis not present

## 2016-09-10 DIAGNOSIS — Z807 Family history of other malignant neoplasms of lymphoid, hematopoietic and related tissues: Secondary | ICD-10-CM

## 2016-09-10 DIAGNOSIS — M87851 Other osteonecrosis, right femur: Secondary | ICD-10-CM | POA: Diagnosis present

## 2016-09-10 DIAGNOSIS — Z87891 Personal history of nicotine dependence: Secondary | ICD-10-CM

## 2016-09-10 DIAGNOSIS — Z6838 Body mass index (BMI) 38.0-38.9, adult: Secondary | ICD-10-CM | POA: Diagnosis not present

## 2016-09-10 DIAGNOSIS — G43909 Migraine, unspecified, not intractable, without status migrainosus: Secondary | ICD-10-CM | POA: Diagnosis present

## 2016-09-10 HISTORY — PX: TOTAL HIP ARTHROPLASTY: SHX124

## 2016-09-10 SURGERY — ARTHROPLASTY, HIP, TOTAL, ANTERIOR APPROACH
Anesthesia: Spinal | Site: Hip | Laterality: Right

## 2016-09-10 MED ORDER — METOCLOPRAMIDE HCL 5 MG/ML IJ SOLN: 5.0000 mg | Freq: Three times a day (TID) | INTRAMUSCULAR | Status: DC | PRN

## 2016-09-10 MED ORDER — HYDROMORPHONE HCL 1 MG/ML IJ SOLN
INTRAMUSCULAR | Status: AC
Start: 2016-09-10 — End: 2016-09-11
  Filled 2016-09-10: qty 0.5

## 2016-09-10 MED ORDER — PROPOFOL 10 MG/ML IV BOLUS
INTRAVENOUS | Status: DC | PRN
  Administered 2016-09-10: 20 mg via INTRAVENOUS

## 2016-09-10 MED ORDER — FLEET ENEMA 7-19 GM/118ML RE ENEM: 1.0000 | ENEMA | Freq: Once | RECTAL | Status: DC | PRN

## 2016-09-10 MED ORDER — COLCHICINE 0.6 MG PO TABS: 0.6000 mg | ORAL_TABLET | Freq: Every day | ORAL | Status: DC | PRN

## 2016-09-10 MED ORDER — FUROSEMIDE 40 MG PO TABS
40.0000 mg | ORAL_TABLET | Freq: Every day | ORAL | Status: DC
  Administered 2016-09-11: 40 mg via ORAL
  Filled 2016-09-10: qty 1

## 2016-09-10 MED ORDER — FENTANYL CITRATE (PF) 100 MCG/2ML IJ SOLN
INTRAMUSCULAR | Status: AC
  Filled 2016-09-10: qty 2

## 2016-09-10 MED ORDER — ALUMINUM HYDROXIDE GEL 320 MG/5ML PO SUSP
15.0000 mL | ORAL | Status: DC | PRN
  Filled 2016-09-10: qty 30

## 2016-09-10 MED ORDER — LISINOPRIL 40 MG PO TABS
40.0000 mg | ORAL_TABLET | Freq: Every evening | ORAL | Status: DC
  Administered 2016-09-10: 40 mg via ORAL
  Filled 2016-09-10: qty 1

## 2016-09-10 MED ORDER — MIDAZOLAM HCL 2 MG/2ML IJ SOLN
INTRAMUSCULAR | Status: AC
  Filled 2016-09-10: qty 2

## 2016-09-10 MED ORDER — FAMOTIDINE 20 MG PO TABS
20.0000 mg | ORAL_TABLET | Freq: Every day | ORAL | Status: DC
Start: 2016-09-11 — End: 2016-09-11
  Administered 2016-09-11: 20 mg via ORAL
  Filled 2016-09-10: qty 1

## 2016-09-10 MED ORDER — METHOCARBAMOL 500 MG PO TABS
500.0000 mg | ORAL_TABLET | Freq: Four times a day (QID) | ORAL | Status: DC | PRN
  Administered 2016-09-10 – 2016-09-11 (×3): 500 mg via ORAL
  Filled 2016-09-10 (×3): qty 1

## 2016-09-10 MED ORDER — FENTANYL CITRATE (PF) 100 MCG/2ML IJ SOLN
INTRAMUSCULAR | Status: DC | PRN
  Administered 2016-09-10 (×2): 50 ug via INTRAVENOUS

## 2016-09-10 MED ORDER — HYDROMORPHONE HCL 1 MG/ML IJ SOLN
INTRAMUSCULAR | Status: AC
  Administered 2016-09-10: 0.5 mg via INTRAVENOUS
  Filled 2016-09-10: qty 0.5

## 2016-09-10 MED ORDER — DULOXETINE HCL 30 MG PO CPEP
30.0000 mg | ORAL_CAPSULE | Freq: Every evening | ORAL | Status: DC
  Administered 2016-09-10: 30 mg via ORAL
  Filled 2016-09-10: qty 1

## 2016-09-10 MED ORDER — CHLORHEXIDINE GLUCONATE 4 % EX LIQD: 60.0000 mL | Freq: Once | CUTANEOUS | Status: DC

## 2016-09-10 MED ORDER — LIDOCAINE 2% (20 MG/ML) 5 ML SYRINGE
INTRAMUSCULAR | Status: AC
  Filled 2016-09-10: qty 5

## 2016-09-10 MED ORDER — AMLODIPINE BESYLATE 5 MG PO TABS
5.0000 mg | ORAL_TABLET | Freq: Every evening | ORAL | Status: DC
  Administered 2016-09-10: 5 mg via ORAL
  Filled 2016-09-10: qty 1

## 2016-09-10 MED ORDER — ATORVASTATIN CALCIUM 40 MG PO TABS
40.0000 mg | ORAL_TABLET | Freq: Every evening | ORAL | Status: DC
  Administered 2016-09-10: 40 mg via ORAL
  Filled 2016-09-10: qty 1

## 2016-09-10 MED ORDER — BUPIVACAINE IN DEXTROSE 0.75-8.25 % IT SOLN
INTRATHECAL | Status: DC | PRN
  Administered 2016-09-10: 15 mg via INTRATHECAL

## 2016-09-10 MED ORDER — METHOCARBAMOL 1000 MG/10ML IJ SOLN
500.0000 mg | Freq: Four times a day (QID) | INTRAVENOUS | Status: DC | PRN
  Filled 2016-09-10: qty 5

## 2016-09-10 MED ORDER — ASPIRIN EC 325 MG PO TBEC
325.0000 mg | DELAYED_RELEASE_TABLET | Freq: Every day | ORAL | Status: DC
  Administered 2016-09-11: 325 mg via ORAL
  Filled 2016-09-10: qty 1

## 2016-09-10 MED ORDER — GABAPENTIN 300 MG PO CAPS
300.0000 mg | ORAL_CAPSULE | Freq: Two times a day (BID) | ORAL | Status: DC
  Administered 2016-09-10 – 2016-09-11 (×2): 300 mg via ORAL
  Filled 2016-09-10 (×2): qty 1

## 2016-09-10 MED ORDER — ONDANSETRON HCL 4 MG/2ML IJ SOLN: 4.0000 mg | Freq: Four times a day (QID) | INTRAMUSCULAR | Status: DC | PRN

## 2016-09-10 MED ORDER — ONDANSETRON HCL 4 MG/2ML IJ SOLN
INTRAMUSCULAR | Status: DC | PRN
  Administered 2016-09-10: 4 mg via INTRAVENOUS

## 2016-09-10 MED ORDER — OXYCODONE HCL 5 MG PO TABS
5.0000 mg | ORAL_TABLET | ORAL | Status: DC | PRN
  Administered 2016-09-10 – 2016-09-11 (×5): 10 mg via ORAL
  Filled 2016-09-10 (×6): qty 2

## 2016-09-10 MED ORDER — LACTATED RINGERS IV SOLN
INTRAVENOUS | Status: DC
  Administered 2016-09-10 (×2): via INTRAVENOUS

## 2016-09-10 MED ORDER — DEXAMETHASONE SODIUM PHOSPHATE 10 MG/ML IJ SOLN
INTRAMUSCULAR | Status: AC
  Filled 2016-09-10: qty 1

## 2016-09-10 MED ORDER — ONDANSETRON HCL 4 MG/2ML IJ SOLN
INTRAMUSCULAR | Status: AC
  Filled 2016-09-10: qty 2

## 2016-09-10 MED ORDER — DEXAMETHASONE SODIUM PHOSPHATE 10 MG/ML IJ SOLN
INTRAMUSCULAR | Status: DC | PRN
  Administered 2016-09-10: 10 mg via INTRAVENOUS

## 2016-09-10 MED ORDER — HYDROXYZINE HCL 25 MG PO TABS: 25.0000 mg | ORAL_TABLET | Freq: Every evening | ORAL | Status: DC | PRN

## 2016-09-10 MED ORDER — TRANEXAMIC ACID 1000 MG/10ML IV SOLN
INTRAVENOUS | Status: DC | PRN
  Administered 2016-09-10: 2000 mg via TOPICAL

## 2016-09-10 MED ORDER — ACETAMINOPHEN 650 MG RE SUPP: 650.0000 mg | Freq: Four times a day (QID) | RECTAL | Status: DC | PRN

## 2016-09-10 MED ORDER — HYDROMORPHONE HCL 1 MG/ML IJ SOLN
0.2500 mg | INTRAMUSCULAR | Status: DC | PRN
  Administered 2016-09-10 (×2): 0.5 mg via INTRAVENOUS

## 2016-09-10 MED ORDER — ONDANSETRON HCL 4 MG PO TABS: 4.0000 mg | ORAL_TABLET | Freq: Four times a day (QID) | ORAL | Status: DC | PRN

## 2016-09-10 MED ORDER — DIAZEPAM 5 MG PO TABS: 5.0000 mg | ORAL_TABLET | Freq: Every day | ORAL | Status: DC | PRN

## 2016-09-10 MED ORDER — KCL IN DEXTROSE-NACL 20-5-0.45 MEQ/L-%-% IV SOLN: INTRAVENOUS | Status: DC

## 2016-09-10 MED ORDER — BUPIVACAINE-EPINEPHRINE (PF) 0.5% -1:200000 IJ SOLN
INTRAMUSCULAR | Status: AC
  Filled 2016-09-10: qty 30

## 2016-09-10 MED ORDER — POLYETHYLENE GLYCOL 3350 17 G PO PACK: 17.0000 g | PACK | Freq: Every day | ORAL | Status: DC | PRN

## 2016-09-10 MED ORDER — ALLOPURINOL 300 MG PO TABS
300.0000 mg | ORAL_TABLET | Freq: Every evening | ORAL | Status: DC
  Administered 2016-09-10: 300 mg via ORAL
  Filled 2016-09-10: qty 1

## 2016-09-10 MED ORDER — BISACODYL 5 MG PO TBEC: 5.0000 mg | DELAYED_RELEASE_TABLET | Freq: Every day | ORAL | Status: DC | PRN

## 2016-09-10 MED ORDER — MIDAZOLAM HCL 5 MG/5ML IJ SOLN
INTRAMUSCULAR | Status: DC | PRN
  Administered 2016-09-10: 2 mg via INTRAVENOUS

## 2016-09-10 MED ORDER — METOPROLOL TARTRATE 50 MG PO TABS
50.0000 mg | ORAL_TABLET | Freq: Two times a day (BID) | ORAL | Status: DC
  Administered 2016-09-10 – 2016-09-11 (×2): 50 mg via ORAL
  Filled 2016-09-10 (×2): qty 1

## 2016-09-10 MED ORDER — MENTHOL 3 MG MT LOZG: 1.0000 | LOZENGE | OROMUCOSAL | Status: DC | PRN

## 2016-09-10 MED ORDER — PROPOFOL 500 MG/50ML IV EMUL
INTRAVENOUS | Status: DC | PRN
  Administered 2016-09-10: 11:00:00 via INTRAVENOUS
  Administered 2016-09-10: 75 ug/kg/min via INTRAVENOUS

## 2016-09-10 MED ORDER — CALCIUM CARBONATE ANTACID 500 MG PO CHEW: 2.0000 | CHEWABLE_TABLET | Freq: Every day | ORAL | Status: DC | PRN

## 2016-09-10 MED ORDER — DEXAMETHASONE SODIUM PHOSPHATE 10 MG/ML IJ SOLN
10.0000 mg | Freq: Once | INTRAMUSCULAR | Status: AC
  Administered 2016-09-11: 10 mg via INTRAVENOUS
  Filled 2016-09-10: qty 1

## 2016-09-10 MED ORDER — TRANEXAMIC ACID 1000 MG/10ML IV SOLN
1000.0000 mg | Freq: Once | INTRAVENOUS | Status: AC
  Administered 2016-09-10: 1000 mg via INTRAVENOUS
  Filled 2016-09-10: qty 10

## 2016-09-10 MED ORDER — 0.9 % SODIUM CHLORIDE (POUR BTL) OPTIME
TOPICAL | Status: DC | PRN
  Administered 2016-09-10: 1000 mL

## 2016-09-10 MED ORDER — BUPIVACAINE-EPINEPHRINE (PF) 0.5% -1:200000 IJ SOLN
INTRAMUSCULAR | Status: DC | PRN
  Administered 2016-09-10: 50 mL

## 2016-09-10 MED ORDER — PROMETHAZINE HCL 25 MG/ML IJ SOLN: 6.2500 mg | INTRAMUSCULAR | Status: DC | PRN

## 2016-09-10 MED ORDER — PHENOL 1.4 % MT LIQD: 1.0000 | OROMUCOSAL | Status: DC | PRN

## 2016-09-10 MED ORDER — ACETAMINOPHEN 325 MG PO TABS
650.0000 mg | ORAL_TABLET | Freq: Four times a day (QID) | ORAL | Status: DC | PRN
  Administered 2016-09-10: 650 mg via ORAL
  Filled 2016-09-10: qty 2

## 2016-09-10 MED ORDER — ALBUTEROL SULFATE (2.5 MG/3ML) 0.083% IN NEBU: 2.5000 mg | INHALATION_SOLUTION | Freq: Four times a day (QID) | RESPIRATORY_TRACT | Status: DC | PRN

## 2016-09-10 MED ORDER — HYDROMORPHONE HCL 2 MG/ML IJ SOLN
0.5000 mg | INTRAMUSCULAR | Status: DC | PRN
  Administered 2016-09-10 – 2016-09-11 (×6): 1 mg via INTRAVENOUS
  Filled 2016-09-10 (×6): qty 1

## 2016-09-10 MED ORDER — DOCUSATE SODIUM 100 MG PO CAPS
100.0000 mg | ORAL_CAPSULE | Freq: Two times a day (BID) | ORAL | Status: DC
  Administered 2016-09-10 – 2016-09-11 (×3): 100 mg via ORAL
  Filled 2016-09-10 (×3): qty 1

## 2016-09-10 MED ORDER — PROPOFOL 10 MG/ML IV BOLUS
INTRAVENOUS | Status: AC
  Filled 2016-09-10: qty 20

## 2016-09-10 MED ORDER — METOCLOPRAMIDE HCL 5 MG PO TABS: 5.0000 mg | ORAL_TABLET | Freq: Three times a day (TID) | ORAL | Status: DC | PRN

## 2016-09-10 SURGICAL SUPPLY — 45 items
BAG DECANTER FOR FLEXI CONT (MISCELLANEOUS) ×6 IMPLANT
BLADE SAW SGTL 18X1.27X75 (BLADE) ×2 IMPLANT
BLADE SAW SGTL 18X1.27X75MM (BLADE) ×1
BNDG COHESIVE 4X5 TAN STRL (GAUZE/BANDAGES/DRESSINGS) ×3 IMPLANT
CAPT HIP TOTAL 2 ×3 IMPLANT
COVER PERINEAL POST (MISCELLANEOUS) ×3 IMPLANT
COVER SURGICAL LIGHT HANDLE (MISCELLANEOUS) ×6 IMPLANT
DRAPE C-ARM 42X72 X-RAY (DRAPES) ×3 IMPLANT
DRAPE STERI IOBAN 125X83 (DRAPES) ×3 IMPLANT
DRAPE U-SHAPE 47X51 STRL (DRAPES) ×6 IMPLANT
DRSG AQUACEL AG ADV 3.5X10 (GAUZE/BANDAGES/DRESSINGS) ×3 IMPLANT
DURAPREP 26ML APPLICATOR (WOUND CARE) ×3 IMPLANT
ELECT BLADE 4.0 EZ CLEAN MEGAD (MISCELLANEOUS) ×3
ELECT REM PT RETURN 9FT ADLT (ELECTROSURGICAL) ×3
ELECTRODE BLDE 4.0 EZ CLN MEGD (MISCELLANEOUS) ×1 IMPLANT
ELECTRODE REM PT RTRN 9FT ADLT (ELECTROSURGICAL) ×1 IMPLANT
FACESHIELD WRAPAROUND (MASK) ×6 IMPLANT
GLOVE BIO SURGEON STRL SZ7.5 (GLOVE) ×3 IMPLANT
GLOVE BIO SURGEON STRL SZ8.5 (GLOVE) ×6 IMPLANT
GLOVE BIOGEL PI IND STRL 8 (GLOVE) ×1 IMPLANT
GLOVE BIOGEL PI IND STRL 9 (GLOVE) ×1 IMPLANT
GLOVE BIOGEL PI INDICATOR 8 (GLOVE) ×2
GLOVE BIOGEL PI INDICATOR 9 (GLOVE) ×2
GOWN STRL REUS W/ TWL LRG LVL3 (GOWN DISPOSABLE) ×1 IMPLANT
GOWN STRL REUS W/ TWL XL LVL3 (GOWN DISPOSABLE) ×2 IMPLANT
GOWN STRL REUS W/TWL LRG LVL3 (GOWN DISPOSABLE) ×2
GOWN STRL REUS W/TWL XL LVL3 (GOWN DISPOSABLE) ×4
KIT BASIN OR (CUSTOM PROCEDURE TRAY) ×3 IMPLANT
KIT ROOM TURNOVER OR (KITS) ×3 IMPLANT
MANIFOLD NEPTUNE II (INSTRUMENTS) ×3 IMPLANT
NEEDLE 22X1 1/2 OR ONLY (MISCELLANEOUS) ×4
NEEDLE 22X1.5 STRL (OR ONLY) (MISCELLANEOUS) ×2 IMPLANT
NS IRRIG 1000ML POUR BTL (IV SOLUTION) ×3 IMPLANT
PACK TOTAL JOINT (CUSTOM PROCEDURE TRAY) ×3 IMPLANT
PAD ARMBOARD 7.5X6 YLW CONV (MISCELLANEOUS) ×6 IMPLANT
SUT VIC AB 1 CTX 36 (SUTURE) ×2
SUT VIC AB 1 CTX36XBRD ANBCTR (SUTURE) ×1 IMPLANT
SUT VIC AB 2-0 CT1 27 (SUTURE) ×2
SUT VIC AB 2-0 CT1 TAPERPNT 27 (SUTURE) ×1 IMPLANT
SUT VIC AB 3-0 PS2 18 (SUTURE) ×2
SUT VIC AB 3-0 PS2 18XBRD (SUTURE) ×1 IMPLANT
SYR CONTROL 10ML LL (SYRINGE) ×6 IMPLANT
TOWEL OR 17X24 6PK STRL BLUE (TOWEL DISPOSABLE) ×3 IMPLANT
TOWEL OR 17X26 10 PK STRL BLUE (TOWEL DISPOSABLE) ×3 IMPLANT
TRAY CATH 16FR W/PLASTIC CATH (SET/KITS/TRAYS/PACK) ×3 IMPLANT

## 2016-09-10 NOTE — Interval H&P Note (Signed)
History and Physical Interval Note:  09/10/2016 7:14 AM  Preston [name]  has presented today for surgery, with the diagnosis of RIGHT HIP AVASCULAR NECROSIS  The various methods of treatment have been discussed with the patient and family. After consideration of risks, benefits and other options for treatment, the patient has consented to  Procedure(s): TOTAL HIP ARTHROPLASTY ANTERIOR APPROACH (Right) as a surgical intervention .  The patient's history has been reviewed, patient examined, no change in status, stable for surgery.  I have reviewed the patient's chart and labs.  Questions were answered to the patient's satisfaction.     Kerin Salen

## 2016-09-10 NOTE — Anesthesia Postprocedure Evaluation (Addendum)
Anesthesia Post Note  Patient: [place]  Procedure(s) Performed: Procedure(s) (LRB): TOTAL HIP ARTHROPLASTY ANTERIOR APPROACH (Right)  Patient location during evaluation: PACU Anesthesia Type: Spinal Level of consciousness: awake and alert Pain management: pain level controlled Vital Signs Assessment: post-procedure vital signs reviewed and stable Respiratory status: spontaneous breathing and respiratory function stable Cardiovascular status: blood pressure returned to baseline and stable Postop Assessment: spinal receding Anesthetic complications: no       Last Vitals:  Vitals:   09/10/16 1300 09/10/16 1315  BP: (!) 125/92 (!) 148/67  Pulse:    Resp:    Temp:      Last Pain:  Vitals:   09/10/16 1300  TempSrc:   PainSc: 5                  Armanie Ullmer DANIEL

## 2016-09-10 NOTE — Anesthesia Procedure Notes (Signed)
Spinal  Patient location during procedure: OR Start time: 09/10/2016 9:56 AM End time: 09/10/2016 10:02 AM Staffing Anesthesiologist: Duane Boston Performed: anesthesiologist  Preanesthetic Checklist Completed: patient identified, surgical consent, pre-op evaluation, timeout performed, IV checked, risks and benefits discussed and monitors and equipment checked Spinal Block Patient position: sitting Prep: DuraPrep Patient monitoring: cardiac monitor, continuous pulse ox and blood pressure Approach: midline Location: L2-3 Injection technique: single-shot Needle Needle type: Pencan  Needle gauge: 24 G Needle length: 9 cm Additional Notes Functioning IV was confirmed and monitors were applied. Sterile prep and drape, including hand hygiene and sterile gloves were used. The patient was positioned and the spine was prepped. The skin was anesthetized with lidocaine.  Free flow of clear CSF was obtained prior to injecting local anesthetic into the CSF.  The spinal needle aspirated freely following injection.  The needle was carefully withdrawn.  The patient tolerated the procedure well.

## 2016-09-10 NOTE — Op Note (Signed)
OPERATIVE REPORT    DATE OF PROCEDURE:  09/10/2016       PREOPERATIVE DIAGNOSIS:  RIGHT HIP AVASCULAR NECROSIS                                                          POSTOPERATIVE DIAGNOSIS:  RIGHT HIP AVASCULAR NECROSIS                                                           PROCEDURE: Anterior R total hip arthroplasty using a 54 mm DePuy Pinnacle  Cup, [employer], 0-degree polyethylene liner, a +1.5 32 mm ceramic head, a 6 Depuy Triloc stem   SURGEON: Mckynleigh Mussell J    ASSISTANT:   Eric K[employer]  (present throughout entire procedure and necessary for timely completion of the procedure)   ANESTHESIA: Spinal BLOOD LOSS: 400 FLUID REPLACEMENT: 1500 crystalloid Antibiotic: 2 gm Ancef Tranexamic Acid: 1gm iv 2gmtopical COMPLICATIONS: none    INDICATIONS FOR PROCEDURE: A 46 y.o. year-old With  [place]   for 3 years, x-rays show bone-on-bone arthritic changes, and osteophytes. Despite conservative measures with observation, anti-inflammatory medicine, narcotics, use of a cane, has severe unremitting pain and can ambulate only a few blocks before resting. Patient desires elective R total hip arthroplasty to decrease pain and increase function. The risks, benefits, and alternatives were discussed at length including but not limited to the risks of infection, bleeding, nerve injury, stiffness, blood clots, the need for revision surgery, cardiopulmonary complications, among others, and they were willing to proceed. Questions answered     PROCEDURE IN DETAIL: The patient was identified by armband,  received preoperative IV antibiotics in the holding area at [facility], taken to the operating room , appropriate anesthetic monitors  were attached and  anesthesia was induced with the patienton the gurney. The HANA boots were applied to the feet and he was then transferred to the HANA table with a peroneal post and support underneath the  non-operative le, which was locked in 5 lb traction. Theoperative lower extremity was then prepped and draped in the usual sterile fashion from just above the iliac crest to the knee. And a timeout procedure was performed. We then made a 12 cm incision along the interval at the leading edge of the tensor fascia lata of starting at 2 cm lateral to and 2 cm distal to the ASIS. Small bleeders in the skin and subcutaneous tissue identified and cauterized we dissected down to the fascia and made an incision in the fascia allowing [place] to elevate the fascia of the tensor muscle and exploited the interval between the rectus and the tensor fascia lata. A Hohmann retractor was then placed along the superior neck of the femur and a Cobra retractor along the inferior neck of the femur we teed the capsule starting out at the superior anterior aspect of the acetabulum going distally and made the T along the neck both leaflets of the T were tagged with #2 Ethibond suture. Cobra retractors were then placed along the inferior and superior neck allowing [place] to perform a standard neck  cut and removed the femoral head with a power corkscrew. We then placed a right angle Hohmann retractor along the anterior aspect of the acetabulum a spiked Cobra in the cotyloid notch and posteriorly a Muelller retractor. We then sequentially reamed up to a 54 mm basket reamer obtaining good coverage in all quadrants, verified by C-arm imaging. Under C-arm control with and hammered into place a 54 mm Pinnacle cup in 45 of abduction and 15 of anteversion. The cup seated nicely and required no supplemental screws. We then placed a central hole Eliminator and a 0 polyethylene liner. The foot was then externally rotated to 110, the HANA elevator was placed around the flare of the greater trochanter and the limb was extended and abducted delivering the proximal femur up into the wound. A medium Hohmann retractor was placed over the greater trochanter and a  Mueller retractor along the posterior femoral neck completing the exposure. We then performed releases superiorly and and inferiorly of the capsule going back to the pirformis fossa superiorly and to the lesser trochanter inferiorly. We then entered the proximal femur with the box cutting offset chisel followed by, a canal sounder, the chili pepper and broaching up to a 6 broach. This seated nicely and we reamed the calcar. A trial reduction was performed with a 1.5 mm 32 mm head.The limb lengths were excellent the hip was stable in 90 of external rotation. At this point the trial components removed and we hammered into place a # 6 Tri-Lock stem with [employer] coating. This was a hi offset stem and a + 1.5 32 mm ceramic ball was then hammered into place the hip was reduced and final C-arm images obtained. The wound was thoroughly irrigated with normal saline solution. We repaired the ant capsule and the tensor fascia lot a with running 0 vicryl suture. the subcutaneous tissue was closed with 2-0 and 3-0 Vicryl suture followed by an Aquacil dressing. At this point the patient was awaken and transferred to hospital gurney without difficulty. The subcutaneous tissue with 0 and 2-0 undyed Vicryl suture and the skin with running  3-0 vicryl subcuticular suture. Aquacil dressing was applied. The patient was then unclamped, rolled supine, awaken extubated and taken to recovery room without difficulty in stable condition.   Frederik Pear J 09/10/2016, 12:10 PM

## 2016-09-10 NOTE — Evaluation (Signed)
Physical Therapy Evaluation Patient Details Name: Preston [name] MRN: [id number] DOB: 1970/09/18 Today's Date: 09/10/2016   History of Present Illness  46 y.o. male admitted to [facility] on 09/10/16 for elective R THA.  Pt with significant PMHx of migraine, panic attacks, HTN, gout, COPD, chronic low back pain, CHF, L THA (06/25/16), and colon surgery.    Clinical Impression  Pt is POD #0 and is moving well, min guard assist overall with RW.  He is hopeful to get home tomorrow with his girlfriend's assist.  He got a regular BSC last admission, but it is too narrow for his body and he and his girlfriend are interested in getting a wider one.   PT to follow acutely for deficits listed below.       Follow Up Recommendations Home health PT;Supervision for mobility/OOB    Equipment Recommendations  3in1 (PT);Other (comment) (wide 3-in-1, one at home doesn't fit)    Recommendations for Other Services   NA    Precautions / Restrictions Precautions Precautions: None Restrictions Weight Bearing Restrictions: Yes RLE Weight Bearing: Weight bearing as tolerated      Mobility  Bed Mobility               General bed mobility comments: Pt was standing EOB with RN when PT came in  Transfers Overall transfer level: Needs assistance   Transfers: Sit to/from Stand Sit to Stand: Min guard         General transfer comment: Min guard assist for safety to assist in sitting down safely in the chair.  Pt needed cues for safe RW use and safe hand placement during transitions.   Ambulation/Gait Ambulation/Gait assistance: Min guard Ambulation Distance (Feet): 20 Feet Assistive device: Rolling walker (2 wheeled) Gait Pattern/deviations: Step-to pattern;Antalgic     General Gait Details: Pt with moderately antalgic gait pattern, verbal cues for upright posture and safe RW proximity (pt pushing it too far ahead).          Balance Overall balance assessment: Needs  assistance Sitting-balance support: Feet supported;No upper extremity supported Sitting balance-Leahy Scale: Good     Standing balance support: Bilateral upper extremity supported Standing balance-Leahy Scale: Fair                               Pertinent Vitals/Pain Pain Assessment: 0-10 Pain Score: 7  Pain Location: right hip Pain Descriptors / Indicators: Aching;Burning Pain Intervention(s): Limited activity within patient's tolerance;Monitored during session;Repositioned;Ice applied    Home Living Family/patient expects to be discharged to:: Private residence Living Arrangements: Spouse/significant other;Children Available Help at Discharge: Family;Friend(s);Available 24 hours/day Type of Home: House Home Access: Stairs to enter Entrance Stairs-Rails: Can reach both Entrance Stairs-Number of Steps: 3 Home Layout: One level Home Equipment: Walker - 2 wheels;Cane - single point;Crutches;Bedside commode (per pt report BSC is too tight for him to sit on. )      Prior Function Level of Independence: Independent               Hand Dominance   Dominant Hand: Right    Extremity/Trunk Assessment   Upper Extremity Assessment Upper Extremity Assessment: Defer to OT evaluation    Lower Extremity Assessment Lower Extremity Assessment: RLE deficits/detail;LLE deficits/detail RLE Deficits / Details: right leg with normal post op pain and weakness.  Ankle 4/5, knee 3-/5, hip 2/5.   LLE Deficits / Details: pt with recent L THA, per pt report he gets  a sharp pain when he coughs or sneezes on this side and he has to brace himself for it or he feels he will fall.  MD aware and going to check it while he is here.     Cervical / Trunk Assessment Cervical / Trunk Assessment: Normal  Communication      Cognition Arousal/Alertness: Awake/alert Behavior During Therapy: WFL for tasks assessed/performed Overall Cognitive Status: Within Functional Limits for tasks  assessed                         Exercises Total Joint Exercises Ankle Circles/Pumps: AROM;Both;20 reps   Assessment/Plan    PT Assessment Patient needs continued PT services  PT Problem List Decreased strength;Decreased range of motion;Decreased activity tolerance;Decreased balance;Decreased mobility;Decreased knowledge of use of DME;Pain          PT Treatment Interventions DME instruction;Gait training;Stair training;Functional mobility training;Therapeutic activities;Balance training;Therapeutic exercise;Patient/family education;Manual techniques;Modalities    PT Goals (Current goals can be found in the Care Plan section)  Acute Rehab PT Goals Patient Stated Goal: to go home tomorrow if he can PT Goal Formulation: With patient Time For Goal Achievement: 09/17/16 Potential to Achieve Goals: Good    Frequency 7X/week           End of Session   Activity Tolerance: Patient limited by pain Patient left: in chair;with call bell/phone within reach;with family/visitor present           Time: [id number] PT Time Calculation (min) (ACUTE ONLY): 29 min   Charges:   PT Evaluation $PT Eval Moderate Complexity: 1 Procedure PT Treatments $Therapeutic Activity: 8-22 mins        Mckell Riecke B. [place], [place], DPT [phone]   09/10/2016, 6:22 PM

## 2016-09-10 NOTE — Transfer of Care (Signed)
Immediate Anesthesia Transfer of Care Note  Patient: [place]  Procedure(s) Performed: Procedure(s): TOTAL HIP ARTHROPLASTY ANTERIOR APPROACH (Right)  Patient Location: PACU  Anesthesia Type:MAC and Spinal  Level of Consciousness: awake, oriented and patient cooperative  Airway & Oxygen Therapy: Patient Spontanous Breathing and Patient connected to face mask oxygen  Post-op Assessment: Report given to RN and Post -op Vital signs reviewed and stable  Post vital signs: Reviewed  Last Vitals:  Vitals:   09/10/16 0711  BP: (!) 137/94  Pulse: 75  Resp: 18  Temp: 37 C    Last Pain:  Vitals:   09/10/16 0735  TempSrc:   PainSc: 7       Patients Stated Pain Goal: 4 (76/14/70 9295)  Complications: No apparent anesthesia complications

## 2016-09-10 NOTE — Anesthesia Procedure Notes (Signed)
Procedure Name: MAC Date/Time: 09/10/2016 10:04 AM Performed by: Jenne Campus Pre-anesthesia Checklist: Patient identified, Emergency Drugs available, Suction available, Patient being monitored and Timeout performed Oxygen Delivery Method: Simple face mask

## 2016-09-10 NOTE — Discharge Instructions (Signed)

## 2016-09-11 ENCOUNTER — Encounter (HOSPITAL_COMMUNITY): Payer: Self-pay | Admitting: Orthopedic Surgery

## 2016-09-11 LAB — BASIC METABOLIC PANEL
ANION GAP: 15 (ref 5–15)
BUN: 19 mg/dL (ref 6–20)
CHLORIDE: 100 mmol/L — AB (ref 101–111)
CO2: 23 mmol/L (ref 22–32)
Calcium: 9.9 mg/dL (ref 8.9–10.3)
Creatinine, Ser: 1.72 mg/dL — ABNORMAL HIGH (ref 0.61–1.24)
GFR calc Af Amer: 54 mL/min — ABNORMAL LOW (ref >60)
GFR calc non Af Amer: 47 mL/min — ABNORMAL LOW (ref >60)
GLUCOSE: 164 mg/dL — AB (ref 65–99)
Potassium: 4.5 mmol/L (ref 3.5–5.1)
Sodium: 138 mmol/L (ref 135–145)

## 2016-09-11 LAB — CBC
HEMATOCRIT: 38.4 % — AB (ref 39.0–52.0)
HEMOGLOBIN: 12.3 g/dL — AB (ref 13.0–17.0)
MCH: 29.7 pg (ref 26.0–34.0)
MCHC: 32 g/dL (ref 30.0–36.0)
MCV: 92.8 fL (ref 78.0–100.0)
Platelets: 315 10*3/uL (ref 150–400)
RBC: 4.14 MIL/uL — AB (ref 4.22–5.81)
RDW: 13.5 % (ref 11.5–15.5)
WBC: 21.1 10*3/uL — ABNORMAL HIGH (ref 4.0–10.5)

## 2016-09-11 NOTE — Progress Notes (Signed)
PATIENT ID: Preston [name]  MRN: [id number]  DOB/AGE:  Jul 16, 1971 / 46 y.o.  1 Day Post-Op Procedure(s) (LRB): TOTAL HIP ARTHROPLASTY ANTERIOR APPROACH (Right)    PROGRESS NOTE Subjective: Patient is alert, oriented, no Nausea, no Vomiting, yes passing gas, . Taking PO well. Denies SOB, Chest or Calf Pain. Using Incentive Spirometer, PAS in place. Ambulate 20 ft Patient reports pain as  3/10  .    Objective: Vital signs in last 24 hours: Vitals:   09/10/16 1335 09/10/16 2117 09/10/16 2356 09/11/16 0439  BP: (!) 145/82 (!) 131/95 135/79 134/81  Pulse: 75 (!) 107 100 88  Resp: 17 18 20 18   Temp: 97.5 F (36.4 C) 97.7 F (36.5 C) 98.1 F (36.7 C) 97.9 F (36.6 C)  TempSrc:  Oral Oral Oral  SpO2: 95% 93% 99% 96%  Weight:      Height:          Intake/Output from previous day: I/O last 3 completed shifts: In: [id number] [P.O.:240; I.V.:1000; IV Piggyback:110] Out: 1400 [Urine:1050; Blood:350]   Intake/Output this shift: No intake/output data recorded.   LABORATORY DATA:  Recent Labs  09/11/16 0358  WBC 21.1*  HGB 12.3*  HCT 38.4*  PLT 315  NA 138  K 4.5  CL 100*  CO2 23  BUN 19  CREATININE 1.72*  GLUCOSE 164*  CALCIUM 9.9    Examination: Neurologically intact ABD soft Neurovascular intact Sensation intact distally Intact pulses distally Dorsiflexion/Plantar flexion intact Incision: dressing C/D/I No cellulitis present Compartment soft} XR AP&Lat of hip shows well placed\fixed THA  Assessment:   1 Day Post-Op Procedure(s) (LRB): TOTAL HIP ARTHROPLASTY ANTERIOR APPROACH (Right) ADDITIONAL DIAGNOSIS:  Expected Acute Blood Loss Anemia, gout, obesity  Plan: PT/OT WBAT, THA  DVT Prophylaxis: SCDx72 hrs, ASA 325 mg BID x 2 weeks  DISCHARGE PLAN: Home, today if passes PT  DISCHARGE NEEDS: HHPT, Walker and 3-in-1 comode seatPatient ID: Preston [name], male   DOB: [birth year], 46 y.o.   MRN: [id number]

## 2016-09-11 NOTE — Progress Notes (Signed)
Patient verbalized understanding of discharge instructions. Patient verbalized understanding of picking up his prescriptions at Dr Damita Dunnings office.

## 2016-09-11 NOTE — Progress Notes (Signed)
RN notified Randall Hiss, [place], for Dr Mayer Camel that patient had no printed prescriptions or order pain medication on his medication reconciliation. Randall Hiss stated that if the patient was OK with going to the office after he left the hospital from discharge then he would print the prescriptions at the office and they would be signed for patient. Patient agreeable to this and Randall Hiss verbalized understanding.

## 2016-09-11 NOTE — Care Management Note (Addendum)
Case Management Note  Patient Details  Name: Preston [name] MRN: [id number] Date of Birth: 08-Nov-1970  Subjective/Objective:    46 yr old gentleman s/p right total hip arthroplasty.                Action/Plan:  Case manager spoke with patient concerning Home health and DME needs. Patient was preoperatively setup with [facility], but he states he doesn't need Home health, he has had this surgery before and knows what to do. Case manager notified Dr. Damita Dunnings office of patient's decision.  Patient states he has a rolling walker, will need 3in1. CM has ordered.   Expected Discharge Date:    09/12/16              Expected Discharge Plan:  Home/Self Care  In-House Referral:  NA  Discharge planning Services  CM Consult  Post Acute Care Choice:  Durable Medical Equipment Choice offered to:  Patient, Sibling  DME Arranged:  3-N-1 DME Agency:  [place]:  Patient Refused [place] Agency:  [place]  Status of Service:  Completed, signed off  If discussed at [place] of Stay Meetings, dates discussed:    Additional Comments:  Ninfa Meeker, RN 09/11/2016, 2:35 PM

## 2016-09-11 NOTE — Progress Notes (Signed)
Physical Therapy Treatment Patient Details Name: Preston [name] MRN: [id number] DOB: [birth year] Today's Date: 09/11/2016    History of Present Illness 46 y.o. male admitted to [facility] on 09/10/16 for elective R THA.  Pt with significant PMHx of migraine, panic attacks, HTN, gout, COPD, chronic low back pain, CHF, L THA (06/25/16), and colon surgery.      PT Comments    Patient is making good progress with PT.  From a mobility standpoint anticipate patient will be ready for DC home when medically ready.     Follow Up Recommendations  Home health PT;Supervision for mobility/OOB     Equipment Recommendations  3in1 (PT);Other (comment) (wide 3-in-1, one at home doesn't fit)    Recommendations for Other Services       Precautions / Restrictions Precautions Precautions: None Restrictions Weight Bearing Restrictions: Yes RLE Weight Bearing: Weight bearing as tolerated    Mobility  Bed Mobility Overal bed mobility: Independent                Transfers Overall transfer level: Needs assistance Equipment used: Rolling walker (2 wheeled) Transfers: Sit to/from Stand Sit to Stand: Supervision Stand pivot transfers: Supervision       General transfer comment: supervision for safety; safe hand placement  Ambulation/Gait Ambulation/Gait assistance: Supervision Ambulation Distance (Feet): 200 Feet Assistive device: Rolling walker (2 wheeled) Gait Pattern/deviations: Step-through pattern;Decreased stride length     General Gait Details: cues for sequencing and posture; pt with good ability to WB on R LE and with good step through pattern   Stairs Stairs: Yes   Stair Management: Two rails;Step to pattern;Forwards Number of Stairs: 2 General stair comments: pt with carry over of sequencing and technique from recent L THA  Wheelchair Mobility    Modified Rankin (Stroke Patients Only)       Balance Overall balance assessment: Needs assistance Sitting-balance  support: Feet supported;No upper extremity supported Sitting balance-Leahy Scale: Good     Standing balance support: Bilateral upper extremity supported Standing balance-Leahy Scale: Fair Standing balance comment: Standing at sink to wash hands                    Cognition Arousal/Alertness: Awake/alert Behavior During Therapy: WFL for tasks assessed/performed Overall Cognitive Status: Within Functional Limits for tasks assessed                      Exercises Total Joint Exercises Hip ABduction/ADduction: AROM;Right;10 reps;Standing Knee Flexion: AROM;Right;10 reps;Standing Marching in Standing: AROM;Right;10 reps;Standing Standing Hip Extension: AROM;Right;10 reps;Standing    General Comments        Pertinent Vitals/Pain Pain Assessment: 0-10 Pain Score: 7  Pain Location: right hip Pain Descriptors / Indicators: Sore;Tightness Pain Intervention(s): Limited activity within patient's tolerance;Monitored during session;Premedicated before session;Repositioned    Home Living Family/patient expects to be discharged to:: Private residence Living Arrangements: Spouse/significant other;Children Available Help at Discharge: Family;Friend(s);Available 24 hours/day Type of Home: Mobile home Home Access: Stairs to enter Entrance Stairs-Rails: Can reach both Home Layout: One level Home Equipment: Walker - 2 wheels;Cane - single point;Crutches;Bedside commode;Adaptive equipment (Needs wide 3:1)      Prior Function Level of Independence: Independent      Comments: limited ambulation due to LE pain   PT Goals (current goals can now be found in the care plan section) Acute Rehab PT Goals Patient Stated Goal: go home PT Goal Formulation: With patient Time For Goal Achievement: 09/17/16 Potential to Achieve Goals: Good Progress towards  PT goals: Progressing toward goals    Frequency    7X/week      PT Plan Current plan remains appropriate     Co-evaluation             End of Session Equipment Utilized During Treatment: Gait belt Activity Tolerance: Patient tolerated treatment well Patient left: in chair;with call bell/phone within reach     Time: 1146-1209 PT Time Calculation (min) (ACUTE ONLY): 23 min  Charges:  $Gait Training: 8-22 mins $Therapeutic Exercise: 8-22 mins                    G Codes:      [place] April, PTA Pager: [phone]   09/11/2016, 1:07 PM

## 2016-09-11 NOTE — Discharge Summary (Signed)
Patient ID: Preston [name] MRN: [id number] DOB/AGE: 08-06-1970 46 y.o.  Admit date: 09/10/2016 Discharge date: 09/11/2016  Admission Diagnoses:  Principal Problem:   Primary osteoarthritis of right hip   Discharge Diagnoses:  Same  Past Medical History:  Diagnosis Date  . Alcohol abuse   . Anxiety   . Arthritis    "hips, knees, back" (03/11/2015)  . Asthma    reports related to anxiety  . CHF (congestive heart failure) ([place])   . Chronic bronchitis ([place])   . Chronic hip pain    "both"  . Chronic lower back pain   . COPD (chronic obstructive pulmonary disease) ([place])   . Diverticulitis   . Diverticulitis   . DJD (degenerative joint disease)   . Dyspnea    with exertion   . GERD (gastroesophageal reflux disease)   . Gout   . History of Clostridium difficile infection   . Hypercholesterolemia   . Hypertension    Does not see a cardiologist  . Migraine    "new onset" (03/11/2015)  . Panic attacks   . Pneumonia   . Poor dentition     Surgeries: Procedure(s): TOTAL HIP ARTHROPLASTY ANTERIOR APPROACH on 09/10/2016   Consultants:   Discharged Condition: Improved  Hospital Course: Preston [name] is an 46 y.o. male who was admitted 09/10/2016 for operative treatment ofPrimary osteoarthritis of right hip. Patient has severe unremitting pain that affects sleep, daily activities, and work/hobbies. After pre-op clearance the patient was taken to the operating room on 09/10/2016 and underwent  Procedure(s): TOTAL HIP ARTHROPLASTY ANTERIOR APPROACH.    Patient was given perioperative antibiotics: Anti-infectives    Start     Dose/Rate Route Frequency Ordered Stop   09/10/16 0830  ceFAZolin (ANCEF) 3 g in dextrose 5 % 50 mL IVPB     3 g 130 mL/hr over 30 Minutes Intravenous To ShortStay Surgical 09/07/16 0741 09/10/16 1016       Patient was given sequential compression devices, early ambulation, and chemoprophylaxis to prevent DVT.  Patient benefited maximally from  hospital stay and there were no complications.    Recent vital signs: Patient Vitals for the past 24 hrs:  BP Temp Temp src Pulse Resp SpO2  09/11/16 0439 134/81 97.9 F (36.6 C) Oral 88 18 96 %  09/10/16 2356 135/79 98.1 F (36.7 C) Oral 100 20 99 %  09/10/16 2117 (!) 131/95 97.7 F (36.5 C) Oral (!) 107 18 93 %     Recent laboratory studies:  Recent Labs  09/11/16 0358  WBC 21.1*  HGB 12.3*  HCT 38.4*  PLT 315  NA 138  K 4.5  CL 100*  CO2 23  BUN 19  CREATININE 1.72*  GLUCOSE 164*  CALCIUM 9.9     Discharge Medications:   Allergies as of 09/11/2016      Reactions   Adhesive [tape] Itching, Rash   Please use "paper" tape.   Morphine And Related Itching, Nausea And Vomiting, Other (See Comments)   "bad feeling"      Medication List    TAKE these medications   acetaminophen-codeine 300-30 MG tablet Commonly known as:  TYLENOL #3 Take 1 tablet by mouth every 4 (four) hours as needed.   albuterol 108 (90 Base) MCG/ACT inhaler Commonly known as:  PROVENTIL HFA;VENTOLIN HFA Inhale 1-2 puffs into the lungs every 6 (six) hours as needed for wheezing or shortness of breath. What changed:  how much to take   allopurinol 300 MG tablet Commonly known  as:  ZYLOPRIM Take 1 tablet (300 mg total) by mouth every evening.   amLODipine 5 MG tablet Commonly known as:  NORVASC Take 1 tablet (5 mg total) by mouth daily. What changed:  when to take this   aspirin EC 325 MG tablet Take 1 tablet (325 mg total) by mouth 2 (two) times daily.   atorvastatin 40 MG tablet Commonly known as:  LIPITOR Take 1 tablet (40 mg total) by mouth every evening.   calcium carbonate 500 MG chewable tablet Commonly known as:  TUMS - dosed in mg elemental calcium Chew 2 tablets by mouth daily as needed for indigestion or heartburn.   colchicine 0.6 MG tablet Take 1 tablet (0.6 mg total) by mouth daily as needed. What changed:  reasons to take this   diazepam 5 MG tablet Commonly known  as:  VALIUM Take 1 tablet (5 mg total) by mouth daily as needed for anxiety.   DULoxetine 30 MG capsule Commonly known as:  CYMBALTA Take 1 capsule (30 mg total) by mouth daily. What changed:  when to take this   famotidine 20 MG tablet Commonly known as:  PEPCID Take 1 tablet (20 mg total) by mouth daily.   furosemide 40 MG tablet Commonly known as:  LASIX Take 1 tablet (40 mg total) by mouth daily.   gabapentin 300 MG capsule Commonly known as:  NEURONTIN Take 1 capsule (300 mg total) by mouth 3 (three) times daily. What changed:  when to take this   hydrOXYzine 25 MG tablet Commonly known as:  ATARAX/VISTARIL Take 1 tablet (25 mg total) by mouth 3 (three) times daily as needed for anxiety. What changed:  when to take this   lisinopril 40 MG tablet Commonly known as:  PRINIVIL,ZESTRIL Take 1 tablet (40 mg total) by mouth daily. What changed:  when to take this   metoprolol 50 MG tablet Commonly known as:  LOPRESSOR Take 1 tablet (50 mg total) by mouth 2 (two) times daily.   tiZANidine 2 MG tablet Commonly known as:  ZANAFLEX Take 1 tablet (2 mg total) by mouth every 6 (six) hours as needed for muscle spasms.            Durable Medical Equipment        Start     Ordered   09/11/16 1101  DME Bedside commode  Once    Comments:  Patient needs wide 3in1 weight 298  Question:  Patient needs a bedside commode to treat with the following condition  Answer:  S/P hip replacement, right   09/11/16 1100   09/10/16 1400  DME Walker rolling  Once    Question:  Patient needs a walker to treat with the following condition  Answer:  S/P hip replacement, right   09/10/16 1359   09/10/16 1400  DME 3 n 1  Once     09/10/16 1359      Diagnostic Studies: Dg C-arm 61-120 Min  Result Date: 09/10/2016 CLINICAL DATA:  Right hip replacement. EXAM: OPERATIVE RIGHT HIP (WITH PELVIS IF PERFORMED) 2 VIEWS TECHNIQUE: Fluoroscopic spot image(s) were submitted for interpretation  post-operatively. COMPARISON:  No recent prior . FINDINGS: Total right hip replacement. Hardware intact. Anatomic alignment. No acute bony abnormality. IMPRESSION: Total right hip replacement. Anatomic alignment. Hardware intact. No acute abnormality . Electronically Signed   By: Marcello Moores  Register   On: 09/10/2016 12:07   Dg Hip Operative Unilat W Or W/o Pelvis Right  Result Date: 09/10/2016 CLINICAL DATA:  Right  hip replacement. EXAM: OPERATIVE RIGHT HIP (WITH PELVIS IF PERFORMED) 2 VIEWS TECHNIQUE: Fluoroscopic spot image(s) were submitted for interpretation post-operatively. COMPARISON:  No recent prior . FINDINGS: Total right hip replacement. Hardware intact. Anatomic alignment. No acute bony abnormality. IMPRESSION: Total right hip replacement. Anatomic alignment. Hardware intact. No acute abnormality . Electronically Signed   By: Marcello Moores  Register   On: 09/10/2016 12:07    Disposition: 01-Home or Self Care  Discharge Instructions    Call MD / Call 911    Complete by:  As directed    If you experience chest pain or shortness of breath, CALL 911 and be transported to the hospital emergency room.  If you develope a fever above 101 F, pus (white drainage) or increased drainage or redness at the wound, or calf pain, call your surgeon's office.   Constipation Prevention    Complete by:  As directed    Drink plenty of fluids.  Prune juice may be helpful.  You may use a stool softener, such as Colace (over the counter) 100 mg twice a day.  Use MiraLax (over the counter) for constipation as needed.   Diet - low sodium heart healthy    Complete by:  As directed    Increase activity slowly as tolerated    Complete by:  As directed       Follow-up Information    Kerin Salen, MD Follow up in 2 week(s).   Specialty:  Orthopedic Surgery Contact information: [place] [address] [phone]            Signed: Kerin Salen 09/11/2016, 2:43 PM

## 2016-09-11 NOTE — Evaluation (Signed)
Occupational Therapy Evaluation Patient Details Name: Preston [name] MRN: [id number] DOB: Nov 18, 1970 Today's Date: 09/11/2016    History of Present Illness 46 y.o. male admitted to [facility] 09/10/16 for elective R THA.  Pt with significant PMHx of migraine, panic attacks, HTN, gout, COPD, chronic low back pain, CHF, L THA (06/25/16), and colon surgery.     Clinical Impression   Pt admitted as above s/p R THA. He plans to d/c home later today with PRN spouse assist. He is currently Mod I bed mobility, supervision for functional mobility in room and for toilet transfers using wide 3:1 over toilet & Min guard assist for LB ADL's. Issued [place] for use at home. Will need wide 3:1 for home use. Denies further acute OT needs at this time.    Follow Up Recommendations  No OT follow up;Supervision - Intermittent    Equipment Recommendations  Other (comment) (Wide 3:1 for home. Issued 80" LH Reacher for LB ADL's)    Recommendations for Other Services       Precautions / Restrictions Precautions Precautions: None Restrictions Weight Bearing Restrictions: Yes RLE Weight Bearing: Weight bearing as tolerated      Mobility Bed Mobility Overal bed mobility: Independent                Transfers Overall transfer level: Needs assistance Equipment used: Rolling walker (2 wheeled) Transfers: Sit to/from [employer] Transfers Sit to Stand: Supervision Stand pivot transfers: Supervision            Balance Overall balance assessment: Needs assistance Sitting-balance support: Feet supported;No upper extremity supported Sitting balance-Leahy Scale: Good     Standing balance support: No upper extremity supported;During functional activity Standing balance-Leahy Scale: Good Standing balance comment: Standing at sink to wash hands                            ADL Overall ADL's : Needs assistance/impaired Eating/Feeding: Modified independent;Sitting    Grooming: Wash/dry hands;Standing;Supervision/safety   Upper Body Bathing: Set up;Sitting   Lower Body Bathing: Set up;Min guard;Sit to/from stand   Upper Body Dressing : Set up;Sitting   Lower Body Dressing: Min guard;Sit to/from stand Lower Body Dressing Details (indicate cue type and reason): Pt reports that his wife can assist PRN for LB ADL's. Issued a 46" LH reahcer for use at home and discussed how to use for LB dressing Toilet Transfer: Supervision/safety;Ambulation;RW;BSC Toilet Transfer Details (indicate cue type and reason): Wide 3:1 over toilet in bathroom Toileting- Clothing Manipulation and Hygiene: Modified independent;Sitting/lateral lean   Tub/ Shower Transfer: Walk-in shower;Supervision/safety;Ambulation;Rolling walker (Simulated - discussed use of 3:1 if it will fit in shower. Pt initially plans to shower at a friends house whom has a handicapped/walk in shower)   Functional mobility during ADLs: Supervision/safety;Rolling walker General ADL Comments: Pt was educated in role of OT. He participated in ADL retraining session to consist of functional mobility in room, bathroom on/off 3:1 over toilet using RW. Discussed LB ADL's and dressing using LH Reacher - issued 75" [place] reacher. Pt also states that his wife can assist with LB ADL's and he has a sock aid at home from a previous surgery. He denies any further acute OT needs at this time.     Vision   No visual deficits. No change from baseline.  Perception     Praxis      Pertinent Vitals/Pain Pain Assessment: 0-10 Pain Score: 7  Pain  Location: right hip Pain Descriptors / Indicators: Aching;Sore Pain Intervention(s): Limited activity within patient's tolerance;Monitored during session;Premedicated before session;Ice applied     Hand Dominance Right   Extremity/Trunk Assessment Upper Extremity Assessment Upper Extremity Assessment: Overall WFL for tasks assessed   Lower Extremity Assessment Lower Extremity  Assessment: Defer to PT evaluation   Cervical / Trunk Assessment Cervical / Trunk Assessment: Normal   Communication Communication Communication: No difficulties   Cognition Arousal/Alertness: Awake/alert Behavior During Therapy: WFL for tasks assessed/performed Overall Cognitive Status: Within Functional Limits for tasks assessed                     General Comments       Exercises       Shoulder Instructions      Home Living Family/patient expects to be discharged to:: Private residence Living Arrangements: Spouse/significant other;Children Available Help at Discharge: Family;Friend(s);Available 24 hours/day Type of Home: Mobile home Home Access: Stairs to enter Entrance Stairs-Number of Steps: 3 Entrance Stairs-Rails: Can reach both Home Layout: One level     Bathroom Shower/Tub: Tub/shower unit;Other (comment) (Plans to go to friends house for first week, has walk in shower) Shower/tub characteristics: Curtain Biochemist, clinical: Standard Bathroom Accessibility: Yes   Home Equipment: Environmental consultant - 2 wheels;Cane - single point;Crutches;Bedside commode;Adaptive equipment (Needs wide 3:1) Adaptive Equipment: Sock aid        Prior Functioning/Environment Level of Independence: Independent        Comments: limited ambulation due to LE pain        OT Problem List:     OT Treatment/Interventions:      OT Goals(Current goals can be found in the care plan section) Acute Rehab OT Goals Patient Stated Goal: Go home today OT Goal Formulation: All assessment and education complete, DC therapy  OT Frequency:     Barriers to D/C:            Co-evaluation              End of Session Equipment Utilized During Treatment: Rolling walker;Other (comment) (Issued LH Reacher for home )  Activity Tolerance: Patient tolerated treatment well;No increased pain Patient left: in chair;with call bell/phone within reach   Time: 1034-1059 OT Time Calculation (min):  25 min Charges:  OT General Charges $OT Visit: 1 Procedure OT Evaluation $OT Eval Low Complexity: 1 Procedure OT Treatments $Self Care/Home Management : 8-22 mins G-Codes:    Josephine Igo Dixon, OTR/L 09/11/2016, 11:12 AM

## 2016-10-30 ENCOUNTER — Telehealth: Payer: Self-pay | Admitting: Internal Medicine

## 2016-10-30 NOTE — Telephone Encounter (Signed)
Please refill acetaminophen-codeine (TYLENOL #3) 300-30 MG tablet. Please contact patient when it's ready for pick up.  Thank you.

## 2016-11-02 ENCOUNTER — Other Ambulatory Visit: Payer: Self-pay | Admitting: Internal Medicine

## 2016-11-02 DIAGNOSIS — G894 Chronic pain syndrome: Secondary | ICD-10-CM

## 2016-11-02 MED ORDER — ACETAMINOPHEN-CODEINE #3 300-30 MG PO TABS: 1.0000 | ORAL_TABLET | ORAL | 0 refills | Status: DC | PRN

## 2016-11-04 ENCOUNTER — Encounter (HOSPITAL_COMMUNITY): Payer: Self-pay | Admitting: Emergency Medicine

## 2016-11-04 ENCOUNTER — Emergency Department (HOSPITAL_COMMUNITY)
Admission: EM | Admit: 2016-11-04 | Discharge: 2016-11-04 | Disposition: A | Discharge status: Home / Self Care | Payer: Medicaid Other | Attending: Emergency Medicine | Admitting: Emergency Medicine

## 2016-11-04 DIAGNOSIS — Z87891 Personal history of nicotine dependence: Secondary | ICD-10-CM | POA: Insufficient documentation

## 2016-11-04 DIAGNOSIS — R739 Hyperglycemia, unspecified: Secondary | ICD-10-CM

## 2016-11-04 DIAGNOSIS — K0889 Other specified disorders of teeth and supporting structures: Secondary | ICD-10-CM

## 2016-11-04 DIAGNOSIS — Z96643 Presence of artificial hip joint, bilateral: Secondary | ICD-10-CM | POA: Diagnosis not present

## 2016-11-04 DIAGNOSIS — Z7984 Long term (current) use of oral hypoglycemic drugs: Secondary | ICD-10-CM | POA: Insufficient documentation

## 2016-11-04 DIAGNOSIS — R51 Headache: Secondary | ICD-10-CM | POA: Diagnosis present

## 2016-11-04 DIAGNOSIS — J449 Chronic obstructive pulmonary disease, unspecified: Secondary | ICD-10-CM | POA: Insufficient documentation

## 2016-11-04 DIAGNOSIS — G43909 Migraine, unspecified, not intractable, without status migrainosus: Secondary | ICD-10-CM | POA: Insufficient documentation

## 2016-11-04 DIAGNOSIS — Z7982 Long term (current) use of aspirin: Secondary | ICD-10-CM | POA: Insufficient documentation

## 2016-11-04 DIAGNOSIS — G43009 Migraine without aura, not intractable, without status migrainosus: Secondary | ICD-10-CM

## 2016-11-04 DIAGNOSIS — E860 Dehydration: Secondary | ICD-10-CM | POA: Diagnosis not present

## 2016-11-04 DIAGNOSIS — E1165 Type 2 diabetes mellitus with hyperglycemia: Secondary | ICD-10-CM | POA: Diagnosis not present

## 2016-11-04 LAB — URINALYSIS, ROUTINE W REFLEX MICROSCOPIC
Bacteria, UA: NONE SEEN
Bilirubin Urine: NEGATIVE
Glucose, UA: >=500 mg/dL — AB
HGB URINE DIPSTICK: NEGATIVE
Ketones, ur: 20 mg/dL — AB
LEUKOCYTES UA: NEGATIVE
Nitrite: NEGATIVE
PH: 5 (ref 5.0–8.0)
Protein, ur: 100 mg/dL — AB
SPECIFIC GRAVITY, URINE: 1.023 (ref 1.005–1.030)

## 2016-11-04 LAB — CBC
HCT: 44.6 % (ref 39.0–52.0)
Hemoglobin: 15.3 g/dL (ref 13.0–17.0)
MCH: 29.8 pg (ref 26.0–34.0)
MCHC: 34.3 g/dL (ref 30.0–36.0)
MCV: 86.9 fL (ref 78.0–100.0)
PLATELETS: 330 10*3/uL (ref 150–400)
RBC: 5.13 MIL/uL (ref 4.22–5.81)
RDW: 12.9 % (ref 11.5–15.5)
WBC: 10.6 10*3/uL — AB (ref 4.0–10.5)

## 2016-11-04 LAB — COMPREHENSIVE METABOLIC PANEL
ALK PHOS: 92 U/L (ref 38–126)
ALT: 36 U/L (ref 17–63)
AST: 19 U/L (ref 15–41)
Albumin: 4.3 g/dL (ref 3.5–5.0)
Anion gap: 18 — ABNORMAL HIGH (ref 5–15)
BUN: 14 mg/dL (ref 6–20)
CALCIUM: 9.9 mg/dL (ref 8.9–10.3)
CHLORIDE: 98 mmol/L — AB (ref 101–111)
CO2: 17 mmol/L — ABNORMAL LOW (ref 22–32)
CREATININE: 0.99 mg/dL (ref 0.61–1.24)
GFR calc Af Amer: >60 mL/min (ref >60)
Glucose, Bld: 288 mg/dL — ABNORMAL HIGH (ref 65–99)
Potassium: 4.2 mmol/L (ref 3.5–5.1)
Sodium: 133 mmol/L — ABNORMAL LOW (ref 135–145)
Total Bilirubin: 1.2 mg/dL (ref 0.3–1.2)
Total Protein: 7.6 g/dL (ref 6.5–8.1)

## 2016-11-04 LAB — CBG MONITORING, ED
GLUCOSE-CAPILLARY: 305 mg/dL — AB (ref 65–99)
Glucose-Capillary: 249 mg/dL — ABNORMAL HIGH (ref 65–99)
Glucose-Capillary: 240 mg/dL — ABNORMAL HIGH (ref 65–99)

## 2016-11-04 LAB — I-STAT TROPONIN, ED: TROPONIN I, POC: 0 ng/mL (ref 0.00–0.08)

## 2016-11-04 MED ORDER — DIPHENHYDRAMINE HCL 50 MG/ML IJ SOLN
25.0000 mg | Freq: Once | INTRAMUSCULAR | Status: AC
  Administered 2016-11-04: 25 mg via INTRAVENOUS
  Filled 2016-11-04: qty 1

## 2016-11-04 MED ORDER — METOCLOPRAMIDE HCL 10 MG PO TABS: 10.0000 mg | ORAL_TABLET | Freq: Four times a day (QID) | ORAL | 0 refills | Status: DC | PRN

## 2016-11-04 MED ORDER — METOCLOPRAMIDE HCL 5 MG/ML IJ SOLN
10.0000 mg | Freq: Once | INTRAMUSCULAR | Status: AC
  Administered 2016-11-04: 10 mg via INTRAVENOUS
  Filled 2016-11-04: qty 2

## 2016-11-04 MED ORDER — CEPHALEXIN 500 MG PO CAPS: 500.0000 mg | ORAL_CAPSULE | Freq: Two times a day (BID) | ORAL | 0 refills | Status: DC

## 2016-11-04 MED ORDER — SODIUM CHLORIDE 0.9 % IV BOLUS (SEPSIS)
1000.0000 mL | Freq: Once | INTRAVENOUS | Status: AC
  Administered 2016-11-04: 1000 mL via INTRAVENOUS

## 2016-11-04 MED ORDER — KETOROLAC TROMETHAMINE 30 MG/ML IJ SOLN
30.0000 mg | Freq: Once | INTRAMUSCULAR | Status: AC
  Administered 2016-11-04: 30 mg via INTRAVENOUS
  Filled 2016-11-04: qty 1

## 2016-11-04 MED ORDER — INSULIN ASPART 100 UNIT/ML ~~LOC~~ SOLN
10.0000 [IU] | Freq: Once | SUBCUTANEOUS | Status: AC
  Administered 2016-11-04: 10 [IU] via INTRAVENOUS
  Filled 2016-11-04: qty 1

## 2016-11-04 MED ORDER — METFORMIN HCL 500 MG PO TABS: 500.0000 mg | ORAL_TABLET | Freq: Two times a day (BID) | ORAL | 0 refills | Status: DC

## 2016-11-04 MED ORDER — IBUPROFEN 600 MG PO TABS: 600.0000 mg | ORAL_TABLET | Freq: Four times a day (QID) | ORAL | 0 refills | Status: DC | PRN

## 2016-11-04 NOTE — ED Notes (Signed)
Pt given urinal instructed on need for urine sample.

## 2016-11-04 NOTE — ED Triage Notes (Signed)
States has felt bad for a week has had a dry mouth and has mouth pain  And blurred visionand he has been voiding a lot, was told a  Few years ago he had hyperglycemia but has never been on any meds for it , someone lent them a glucometer and it was in the 200s

## 2016-11-06 NOTE — ED Provider Notes (Signed)
[facility] DEPT Provider Note   CSN: [id number] Arrival date & time: 11/04/16  1350     History   Chief Complaint Chief Complaint  Patient presents with  . Headache  . Dental Pain  . Hyperglycemia    HPI Preston [name] is a 46 y.o. male.  HPI Patient with history of CHF, alcohol abuse, COPD, migraines and hypertension presents with one week of diffuse lower dental pain. States he's had generalized throbbing headache that was gradual in onset. Endorses photophobia and some nausea. Similar to previous migraines. He states he's had some blurred vision in both eyes and lightheadedness with standing. Endorses increased thirst and urinary frequency. Never diagnosed with diabetes. Had glucometer reading in the 200s prior to presentation. Past Medical History:  Diagnosis Date  . Alcohol abuse   . Anxiety   . Arthritis    "hips, knees, back" (03/11/2015)  . Asthma    reports related to anxiety  . CHF (congestive heart failure) ([place])   . Chronic bronchitis ([place])   . Chronic hip pain    "both"  . Chronic lower back pain   . COPD (chronic obstructive pulmonary disease) ([place])   . Diverticulitis   . Diverticulitis   . DJD (degenerative joint disease)   . Dyspnea    with exertion   . GERD (gastroesophageal reflux disease)   . Gout   . History of Clostridium difficile infection   . Hypercholesterolemia   . Hypertension    Does not see a cardiologist  . Migraine    "new onset" (03/11/2015)  . Panic attacks   . Pneumonia   . Poor dentition     Patient Active Problem List   Diagnosis Date Noted  . Primary osteoarthritis of right hip 09/07/2016  . Primary localized osteoarthritis of left hip 06/25/2016  . Primary osteoarthritis of left hip 06/24/2016  . Tobacco use disorder 10/13/2015  . Gastroesophageal reflux disease without esophagitis 07/14/2015  . Essential hypertension, benign 03/24/2015  . Chronic diastolic congestive heart failure ([place]) 03/24/2015  . Morbid  obesity ([place]) 03/24/2015  . Nicotine abuse 03/24/2015  . Generalized anxiety disorder 03/24/2015  . Primary gout 03/24/2015  . Chronic pain syndrome 03/24/2015  . COPD (chronic obstructive pulmonary disease) ([place])   . HLD (hyperlipidemia) 03/11/2015  . GERD (gastroesophageal reflux disease) 03/11/2015  . Depression 03/11/2015  . SOB (shortness of breath) 03/11/2015  . Bilateral leg edema 03/11/2015  . Alcohol abuse 03/11/2015  . Elevated troponin 03/11/2015  . Diverticulitis large intestine 07/08/2013  . Personal history of colonic polyps 03/10/2013  . Diverticulitis 02/26/2013  . Diverticulosis 02/03/2013  . Tobacco abuse 12/30/2012  . Gout     Past Surgical History:  Procedure Laterality Date  . APPENDECTOMY    . COLON SURGERY     14 inches of colon removed  . COLONOSCOPY WITH PROPOFOL N/A 04/01/2013   Procedure: COLONOSCOPY WITH PROPOFOL;  Surgeon: Arta Silence, MD;  Location: [facility] ENDOSCOPY;  Service: Endoscopy;  Laterality: N/A;  . FLEXIBLE SIGMOIDOSCOPY N/A 02/28/2013   Procedure: FLEXIBLE SIGMOIDOSCOPY;  Surgeon: Winfield Cunas., MD;  Location: [facility] ENDOSCOPY;  Service: Endoscopy;  Laterality: N/A;  . LAPAROSCOPIC LOW ANTERIOR RESECTION N/A 07/08/2013   Procedure: LAPAROSCOPIC LOW ANTERIOR RESECTION WITH TAKEDOWN OF SPLENIC FLEXURE.;  Surgeon: Adin Hector, MD;  Location: [facility];  Service: General;  Laterality: N/A;  . MANDIBLE SURGERY Left 2010   "dry skin pocket cut out"  . TOTAL HIP ARTHROPLASTY Left 06/25/2016   Procedure: TOTAL  HIP ARTHROPLASTY ANTERIOR APPROACH;  Surgeon: Frederik Pear, MD;  Location: [facility];  Service: Orthopedics;  Laterality: Left;  . TOTAL HIP ARTHROPLASTY Right 09/10/2016   Procedure: TOTAL HIP ARTHROPLASTY ANTERIOR APPROACH;  Surgeon: Frederik Pear, MD;  Location: [facility];  Service: Orthopedics;  Laterality: Right;       Home Medications    Prior to Admission medications   Medication Sig Start Date End Date Taking? Authorizing Provider    albuterol (PROVENTIL HFA;VENTOLIN HFA) 108 (90 Base) MCG/ACT inhaler Inhale 1-2 puffs into the lungs every 6 (six) hours as needed for wheezing or shortness of breath. Patient taking differently: Inhale 2 puffs into the lungs every 6 (six) hours as needed for wheezing or shortness of breath.  03/29/16  Yes Tresa Garter, MD  allopurinol (ZYLOPRIM) 300 MG tablet Take 1 tablet (300 mg total) by mouth every evening. Patient taking differently: Take 300 mg by mouth at bedtime.  03/29/16  Yes Tresa Garter, MD  amLODipine (NORVASC) 5 MG tablet Take 1 tablet (5 mg total) by mouth daily. Patient taking differently: Take 5 mg by mouth at bedtime.  03/29/16  Yes Tresa Garter, MD  aspirin EC 325 MG tablet Take 1 tablet (325 mg total) by mouth 2 (two) times daily. Patient taking differently: Take 325 mg by mouth at bedtime.  06/25/16  Yes Leighton Parody, PA-C  atorvastatin (LIPITOR) 40 MG tablet Take 1 tablet (40 mg total) by mouth every evening. Patient taking differently: Take 40 mg by mouth at bedtime.  03/29/16  Yes Tresa Garter, MD  calcium carbonate (TUMS EX) 750 MG chewable tablet Chew 1 tablet by mouth 3 (three) times daily as needed for heartburn.   Yes Historical Provider, MD  colchicine 0.6 MG tablet Take 1 tablet (0.6 mg total) by mouth daily as needed. Patient taking differently: Take 0.6 mg by mouth daily as needed (gout).  03/29/16  Yes Olugbemiga Essie Christine, MD  diazepam (VALIUM) 5 MG tablet Take 1 tablet (5 mg total) by mouth daily as needed for anxiety. Patient taking differently: Take 5 mg by mouth at bedtime as needed for anxiety (panic attacks).  08/29/16  Yes Tresa Garter, MD  DULoxetine (CYMBALTA) 30 MG capsule Take 1 capsule (30 mg total) by mouth daily. Patient taking differently: Take 30 mg by mouth at bedtime.  03/29/16  Yes Tresa Garter, MD  famotidine (PEPCID) 20 MG tablet Take 1 tablet (20 mg total) by mouth daily. 03/29/16  Yes Tresa Garter, MD  furosemide (LASIX) 40 MG tablet Take 1 tablet (40 mg total) by mouth daily. 03/29/16  Yes Tresa Garter, MD  gabapentin (NEURONTIN) 300 MG capsule Take 1 capsule (300 mg total) by mouth 3 (three) times daily. Patient taking differently: Take 300 mg by mouth See admin instructions. Take 1 capsule (300 mg) by mouth every morning and 2 capsules (600 mg) at night 03/29/16  Yes Olugbemiga E Doreene Burke, MD  hydrOXYzine (ATARAX/VISTARIL) 25 MG tablet Take 1 tablet (25 mg total) by mouth 3 (three) times daily as needed for anxiety. Patient taking differently: Take 25 mg by mouth at bedtime as needed for anxiety (panic attacks).  03/29/16  Yes Tresa Garter, MD  lisinopril (PRINIVIL,ZESTRIL) 40 MG tablet Take 1 tablet (40 mg total) by mouth daily. Patient taking differently: Take 40 mg by mouth at bedtime.  03/29/16  Yes Tresa Garter, MD  metoprolol (LOPRESSOR) 50 MG tablet Take 1 tablet (50 mg total) by  mouth 2 (two) times daily. 03/29/16  Yes Olugbemiga Essie Christine, MD  tiZANidine (ZANAFLEX) 2 MG tablet Take 1 tablet (2 mg total) by mouth every 6 (six) hours as needed for muscle spasms. 06/25/16  Yes Leighton Parody, PA-C  acetaminophen-codeine (TYLENOL #3) 300-30 MG tablet Take 1 tablet by mouth every 4 (four) hours as needed. 11/02/16   Tresa Garter, MD  cephALEXin (KEFLEX) 500 MG capsule Take 1 capsule (500 mg total) by mouth 2 (two) times daily. 11/04/16   Julianne Rice, MD  ibuprofen (ADVIL,MOTRIN) 600 MG tablet Take 1 tablet (600 mg total) by mouth every 6 (six) hours as needed. 11/04/16   Julianne Rice, MD  metFORMIN (GLUCOPHAGE) 500 MG tablet Take 1 tablet (500 mg total) by mouth 2 (two) times daily with a meal. 11/04/16   Julianne Rice, MD  metoCLOPramide (REGLAN) 10 MG tablet Take 1 tablet (10 mg total) by mouth every 6 (six) hours as needed for nausea (headache). 11/04/16   Julianne Rice, MD    Family History Family History  Problem Relation Age of Onset  . Cancer  Father     lymphoma  . Heart attack Mother     Social History Social History  Substance Use Topics  . Smoking status: Former Smoker    Packs/day: 1.00    Years: 30.00    Types: Cigarettes    Quit date: 06/11/2016  . Smokeless tobacco: Former Systems developer    Types: Chew     Comment: "quit chewing in the 1990's"  . Alcohol use 3.6 oz/week    6 Cans of beer per week     Comment: 6 beers a day, as of 08/29/16 down to 2 beers     Allergies   Adhesive [tape] and Morphine and related   Review of Systems Review of Systems  Constitutional: Positive for fatigue. Negative for chills and fever.  HENT: Positive for dental problem. Negative for sinus pressure, sore throat and trouble swallowing.   Eyes: Positive for visual disturbance. Negative for pain.  Respiratory: Negative for cough and shortness of breath.   Cardiovascular: Negative for chest pain, palpitations and leg swelling.  Gastrointestinal: Positive for nausea. Negative for abdominal pain, constipation, diarrhea and vomiting.  Genitourinary: Positive for frequency. Negative for difficulty urinating, dysuria, flank pain and hematuria.  Musculoskeletal: Negative for back pain, neck pain and neck stiffness.  Skin: Negative for rash and wound.  Neurological: Positive for dizziness, light-headedness and headaches. Negative for syncope, speech difficulty, weakness and numbness.  All other systems reviewed and are negative.    Physical Exam Updated Vital Signs BP 122/78   Pulse 81   Temp 98.9 F (37.2 C) (Oral)   Resp 13   SpO2 97%   Physical Exam  Constitutional: He is oriented to person, place, and time. He appears well-developed and well-nourished.  HENT:  Head: Normocephalic and atraumatic.  Mouth/Throat: Oropharynx is clear and moist.  Very poor dentition with multiple fractured teeth. No gingival swelling or masses.  Eyes: Conjunctivae and EOM are normal. Pupils are equal, round, and reactive to light. Right eye exhibits  no discharge. Left eye exhibits no discharge.  Neck: Normal range of motion. Neck supple.  No meningismus. No anterior cervical lymphadenopathy. No stridor appreciated.  Cardiovascular: Normal rate and regular rhythm.  Exam reveals no gallop and no friction rub.   No murmur heard. Pulmonary/Chest: Effort normal and breath sounds normal. No respiratory distress. He has no wheezes. He has no rales. He exhibits no tenderness.  Abdominal: Soft. Bowel sounds are normal. There is no tenderness. There is no rebound and no guarding.  Musculoskeletal: Normal range of motion. He exhibits no edema or tenderness.  No CVA tenderness bilaterally. No lower extremity swelling, asymmetry or tenderness. Distal pulses are 2+.  Neurological: He is alert and oriented to person, place, and time.  Patient is alert and oriented x3 with clear, goal oriented speech. Patient has 5/5 motor in all extremities. Sensation is intact to light touch. Bilateral finger-to-nose is normal with no signs of dysmetria. Patient has a normal gait and walks without assistance.  Skin: Skin is warm and dry. Capillary refill takes less than 2 seconds. No rash noted. No erythema.  Psychiatric: He has a normal mood and affect. His behavior is normal.  Nursing note and vitals reviewed.    ED Treatments / Results  Labs (all labs ordered are listed, but only abnormal results are displayed) Labs Reviewed  CBC - Abnormal; Notable for the following:       Result Value   WBC 10.6 (*)    All other components within normal limits  URINALYSIS, ROUTINE W REFLEX MICROSCOPIC - Abnormal; Notable for the following:    APPearance HAZY (*)    Glucose, UA >=500 (*)    Ketones, ur 20 (*)    Protein, ur 100 (*)    Squamous Epithelial / LPF 0-5 (*)    All other components within normal limits  COMPREHENSIVE METABOLIC PANEL - Abnormal; Notable for the following:    Sodium 133 (*)    Chloride 98 (*)    CO2 17 (*)    Glucose, Bld 288 (*)    Anion gap  18 (*)    All other components within normal limits  CBG MONITORING, ED - Abnormal; Notable for the following:    Glucose-Capillary 305 (*)    All other components within normal limits  CBG MONITORING, ED - Abnormal; Notable for the following:    Glucose-Capillary 249 (*)    All other components within normal limits  CBG MONITORING, ED - Abnormal; Notable for the following:    Glucose-Capillary 240 (*)    All other components within normal limits  I-STAT TROPOININ, ED    EKG  EKG Interpretation  Date/Time:  Sunday November 04 2016 18:09:04 EDT Ventricular Rate:  86 PR Interval:    QRS Duration: 100 QT Interval:  419 QTC Calculation: 502 R Axis:   87 Text Interpretation:  Sinus rhythm Prolonged QT interval No significant change since last tracing Confirmed by YAO  MD, Conni Knighton ([id number]) on 11/05/2016 9:32:27 PM       Radiology No results found.  Procedures Procedures (including critical care time)  Medications Ordered in ED Medications  sodium chloride 0.9 % bolus 1,000 mL (0 mLs Intravenous Stopped 11/04/16 1932)  ketorolac (TORADOL) 30 MG/ML injection 30 mg (30 mg Intravenous Given 11/04/16 1757)  metoCLOPramide (REGLAN) injection 10 mg (10 mg Intravenous Given 11/04/16 1757)  diphenhydrAMINE (BENADRYL) injection 25 mg (25 mg Intravenous Given 11/04/16 1757)  insulin aspart (novoLOG) injection 10 Units (10 Units Intravenous Given 11/04/16 1758)  sodium chloride 0.9 % bolus 1,000 mL (0 mLs Intravenous Stopped 11/04/16 2129)     Initial Impression / Assessment and Plan / ED Course  I have reviewed the triage vital signs and the nursing notes.  Pertinent labs & imaging results that were available during my care of the patient were reviewed by me and considered in my medical decision making (see chart for details).  Patient is very well-appearing. Normal neurologic exam. Migraines consistent with prior history of headaches. Patient does have elevation in his blood sugar. There is  evidence of dehydration and laboratory workup. Given IV fluids and insulin. States his symptoms have improved. Still has some mild blurred vision which is been present for some time. He is advised to follow-up closely with ophthalmology. Also advised to follow-up with his primary physician regarding elevation in his blood sugar. We'll start Glucophage and give dietary instructions for newly diagnosed diabetes. Patient also has multiple dental fractures and need for mass extraction of his lower teeth. No abscesses identified. We'll start on antibiotics and advised to follow-up with dentist. Return precautions given.  Final Clinical Impressions(s) / ED Diagnoses   Final diagnoses:  Hyperglycemia  Pain, dental  Dehydration  Migraine without aura and without status migrainosus, not intractable    New Prescriptions Discharge Medication List as of 11/04/2016 10:40 PM    START taking these medications   Details  cephALEXin (KEFLEX) 500 MG capsule Take 1 capsule (500 mg total) by mouth 2 (two) times daily., Starting Sun 11/04/2016, Print    ibuprofen (ADVIL,MOTRIN) 600 MG tablet Take 1 tablet (600 mg total) by mouth every 6 (six) hours as needed., Starting Sun 11/04/2016, Print    metFORMIN (GLUCOPHAGE) 500 MG tablet Take 1 tablet (500 mg total) by mouth 2 (two) times daily with a meal., Starting Sun 11/04/2016, Print    metoCLOPramide (REGLAN) 10 MG tablet Take 1 tablet (10 mg total) by mouth every 6 (six) hours as needed for nausea (headache)., Starting Sun 11/04/2016, Print         Julianne Rice, MD 11/06/16 [phone]

## 2016-11-07 ENCOUNTER — Ambulatory Visit: Payer: Medicaid Other | Attending: Internal Medicine | Admitting: Internal Medicine

## 2016-11-07 ENCOUNTER — Encounter: Payer: Self-pay | Admitting: Internal Medicine

## 2016-11-07 VITALS — BP 113/68 | HR 81 | Temp 98.1°F | Resp 18 | Ht 74.0 in | Wt 275.0 lb

## 2016-11-07 DIAGNOSIS — I110 Hypertensive heart disease with heart failure: Secondary | ICD-10-CM | POA: Diagnosis not present

## 2016-11-07 DIAGNOSIS — I10 Essential (primary) hypertension: Secondary | ICD-10-CM | POA: Diagnosis not present

## 2016-11-07 DIAGNOSIS — F411 Generalized anxiety disorder: Secondary | ICD-10-CM | POA: Insufficient documentation

## 2016-11-07 DIAGNOSIS — E7800 Pure hypercholesterolemia, unspecified: Secondary | ICD-10-CM | POA: Insufficient documentation

## 2016-11-07 DIAGNOSIS — Z96641 Presence of right artificial hip joint: Secondary | ICD-10-CM | POA: Insufficient documentation

## 2016-11-07 DIAGNOSIS — J449 Chronic obstructive pulmonary disease, unspecified: Secondary | ICD-10-CM | POA: Diagnosis not present

## 2016-11-07 DIAGNOSIS — Z7982 Long term (current) use of aspirin: Secondary | ICD-10-CM | POA: Diagnosis not present

## 2016-11-07 DIAGNOSIS — Z9109 Other allergy status, other than to drugs and biological substances: Secondary | ICD-10-CM | POA: Insufficient documentation

## 2016-11-07 DIAGNOSIS — G894 Chronic pain syndrome: Secondary | ICD-10-CM | POA: Diagnosis not present

## 2016-11-07 DIAGNOSIS — K219 Gastro-esophageal reflux disease without esophagitis: Secondary | ICD-10-CM | POA: Insufficient documentation

## 2016-11-07 DIAGNOSIS — M109 Gout, unspecified: Secondary | ICD-10-CM | POA: Insufficient documentation

## 2016-11-07 DIAGNOSIS — K029 Dental caries, unspecified: Secondary | ICD-10-CM | POA: Insufficient documentation

## 2016-11-07 DIAGNOSIS — Z885 Allergy status to narcotic agent status: Secondary | ICD-10-CM | POA: Insufficient documentation

## 2016-11-07 DIAGNOSIS — M1990 Unspecified osteoarthritis, unspecified site: Secondary | ICD-10-CM | POA: Insufficient documentation

## 2016-11-07 DIAGNOSIS — E119 Type 2 diabetes mellitus without complications: Secondary | ICD-10-CM | POA: Insufficient documentation

## 2016-11-07 DIAGNOSIS — M25552 Pain in left hip: Secondary | ICD-10-CM | POA: Diagnosis not present

## 2016-11-07 DIAGNOSIS — Z7984 Long term (current) use of oral hypoglycemic drugs: Secondary | ICD-10-CM | POA: Insufficient documentation

## 2016-11-07 DIAGNOSIS — I509 Heart failure, unspecified: Secondary | ICD-10-CM | POA: Insufficient documentation

## 2016-11-07 DIAGNOSIS — G43909 Migraine, unspecified, not intractable, without status migrainosus: Secondary | ICD-10-CM | POA: Diagnosis not present

## 2016-11-07 DIAGNOSIS — Z8619 Personal history of other infectious and parasitic diseases: Secondary | ICD-10-CM | POA: Diagnosis not present

## 2016-11-07 LAB — POCT GLYCOSYLATED HEMOGLOBIN (HGB A1C): HEMOGLOBIN A1C: 11.7

## 2016-11-07 LAB — POCT URINALYSIS DIPSTICK
GLUCOSE UA: 500
Ketones, UA: 40
Leukocytes, UA: NEGATIVE
Nitrite, UA: NEGATIVE
PROTEIN UA: NEGATIVE
RBC UA: NEGATIVE
UROBILINOGEN UA: 0.2 U/dL
pH, UA: 5 (ref 5.0–8.0)

## 2016-11-07 MED ORDER — ACCU-CHEK SOFTCLIX LANCET DEV MISC: 12 refills | Status: DC

## 2016-11-07 MED ORDER — ACCU-CHEK AVIVA PLUS W/DEVICE KIT: 1.0000 | PACK | Freq: Three times a day (TID) | 0 refills | Status: DC

## 2016-11-07 MED ORDER — METFORMIN HCL 1000 MG PO TABS: 1000.0000 mg | ORAL_TABLET | Freq: Two times a day (BID) | ORAL | 3 refills | Status: DC

## 2016-11-07 MED ORDER — INSULIN ASPART 100 UNIT/ML ~~LOC~~ SOLN
20.0000 [IU] | Freq: Once | SUBCUTANEOUS | Status: AC
  Administered 2016-11-07: 20 [IU] via SUBCUTANEOUS

## 2016-11-07 MED ORDER — GLUCOSE BLOOD VI STRP: ORAL_STRIP | 12 refills | Status: DC

## 2016-11-07 NOTE — Patient Instructions (Signed)
Diabetes Mellitus and Food It is important for you to manage your blood sugar (glucose) level. Your blood glucose level can be greatly affected by what you eat. Eating healthier foods in the appropriate amounts throughout the day at about the same time each day will help you control your blood glucose level. It can also help slow or prevent worsening of your diabetes mellitus. Healthy eating may even help you improve the level of your blood pressure and reach or maintain a healthy weight. General recommendations for healthful eating and cooking habits include:  Eating meals and snacks regularly. Avoid going long periods of time without eating to lose weight.  Eating a diet that consists mainly of plant-based foods, such as fruits, vegetables, nuts, legumes, and whole grains.  Using low-heat cooking methods, such as baking, instead of high-heat cooking methods, such as deep frying.  Work with your dietitian to make sure you understand how to use the Nutrition Facts information on food labels. How can food affect me? Carbohydrates Carbohydrates affect your blood glucose level more than any other type of food. Your dietitian will help you determine how many carbohydrates to eat at each meal and teach you how to count carbohydrates. Counting carbohydrates is important to keep your blood glucose at a healthy level, especially if you are using insulin or taking certain medicines for diabetes mellitus. Alcohol Alcohol can cause sudden decreases in blood glucose (hypoglycemia), especially if you use insulin or take certain medicines for diabetes mellitus. Hypoglycemia can be a life-threatening condition. Symptoms of hypoglycemia (sleepiness, dizziness, and disorientation) are similar to symptoms of having too much alcohol. If your health care provider has given you approval to drink alcohol, do so in moderation and use the following guidelines:  Women should not have more than one drink per day, and men  should not have more than two drinks per day. One drink is equal to: ? 12 oz of beer. ? 5 oz of wine. ? 1 oz of hard liquor.  Do not drink on an empty stomach.  Keep yourself hydrated. Have water, diet soda, or unsweetened iced tea.  Regular soda, juice, and other mixers might contain a lot of carbohydrates and should be counted.  What foods are not recommended? As you make food choices, it is important to remember that all foods are not the same. Some foods have fewer nutrients per serving than other foods, even though they might have the same number of calories or carbohydrates. It is difficult to get your body what it needs when you eat foods with fewer nutrients. Examples of foods that you should avoid that are high in calories and carbohydrates but low in nutrients include:  Trans fats (most processed foods list trans fats on the Nutrition Facts label).  Regular soda.  Juice.  Candy.  Sweets, such as cake, pie, doughnuts, and cookies.  Fried foods.  What foods can I eat? Eat nutrient-rich foods, which will nourish your body and keep you healthy. The food you should eat also will depend on several factors, including:  The calories you need.  The medicines you take.  Your weight.  Your blood glucose level.  Your blood pressure level.  Your cholesterol level.  You should eat a variety of foods, including:  Protein. ? Lean cuts of meat. ? Proteins low in saturated fats, such as fish, egg whites, and beans. Avoid processed meats.  Fruits and vegetables. ? Fruits and vegetables that may help control blood glucose levels, such as apples,   yams.  Dairy products.  Choose fat-free or low-fat dairy products, such as milk, yogurt, and cheese.  Grains, bread, pasta, and rice.  Choose whole grain products, such as multigrain bread, whole oats, and brown rice. These foods may help control blood pressure.  Fats.  Foods containing healthful fats, such as nuts,  avocado, olive oil, canola oil, and fish. Does everyone with diabetes mellitus have the same meal plan? Because every person with diabetes mellitus is different, there is not one meal plan that works for everyone. It is very important that you meet with a dietitian who will help you create a meal plan that is just right for you. This information is not intended to replace advice given to you by your health care provider. Make sure you discuss any questions you have with your health care provider. Document Released: 04/12/2005 Document Revised: 12/22/2015 Document Reviewed: 06/12/2013 Elsevier Interactive Patient Education  2017 [place]. Diabetes Mellitus and Exercise Exercising regularly is important for your overall health, especially when you have diabetes (diabetes mellitus). Exercising is not only about losing weight. It has many health benefits, such as increasing muscle strength and bone density and reducing body fat and stress. This leads to improved fitness, flexibility, and endurance, all of which result in better overall health. Exercise has additional benefits for people with diabetes, including:  Reducing appetite.  Helping to lower and control blood glucose.  Lowering blood pressure.  Helping to control amounts of fatty substances (lipids) in the blood, such as cholesterol and triglycerides.  Helping the body to respond better to insulin (improving insulin sensitivity).  Reducing how much insulin the body needs.  Decreasing the risk for heart disease by:  Lowering cholesterol and triglyceride levels.  Increasing the levels of good cholesterol.  Lowering blood glucose levels. What is my activity plan? Your health care provider or certified diabetes educator can help you make a plan for the type and frequency of exercise (activity plan) that works for you. Make sure that you:  Do at least 150 minutes of moderate-intensity or vigorous-intensity exercise each week. This  could be brisk walking, biking, or water aerobics.  Do stretching and strength exercises, such as yoga or weightlifting, at least 2 times a week.  Spread out your activity over at least 3 days of the week.  Get some form of physical activity every day.  Do not go more than 2 days in a row without some kind of physical activity.  Avoid being inactive for more than 90 minutes at a time. Take frequent breaks to walk or stretch.  Choose a type of exercise or activity that you enjoy, and set realistic goals.  Start slowly, and gradually increase the intensity of your exercise over time. What do I need to know about managing my diabetes?  Check your blood glucose before and after exercising.  If your blood glucose is higher than 240 mg/dL (13.3 mmol/L) before you exercise, check your urine for ketones. If you have ketones in your urine, do not exercise until your blood glucose returns to normal.  Know the symptoms of low blood glucose (hypoglycemia) and how to treat it. Your risk for hypoglycemia increases during and after exercise. Common symptoms of hypoglycemia can include:  Hunger.  Anxiety.  Sweating and feeling clammy.  Confusion.  Dizziness or feeling light-headed.  Increased heart rate or palpitations.  Blurry vision.  Tingling or numbness around the mouth, lips, or tongue.  Tremors or shakes.  Irritability.  Keep a rapid-acting  carbohydrate snack available before, during, and after exercise to help prevent or treat hypoglycemia.  Avoid injecting insulin into areas of the body that are going to be exercised. For example, avoid injecting insulin into:  The arms, when playing tennis.  The legs, when jogging.  Keep records of your exercise habits. Doing this can help you and your health care provider adjust your diabetes management plan as needed. Write down:  Food that you eat before and after you exercise.  Blood glucose levels before and after you  exercise.  The type and amount of exercise you have done.  When your insulin is expected to peak, if you use insulin. Avoid exercising at times when your insulin is peaking.  When you start a new exercise or activity, work with your health care provider to make sure the activity is safe for you, and to adjust your insulin, medicines, or food intake as needed.  Drink plenty of water while you exercise to prevent dehydration or heat stroke. Drink enough fluid to keep your urine clear or pale yellow. This information is not intended to replace advice given to you by your health care provider. Make sure you discuss any questions you have with your health care provider. Document Released: 10/06/2003 Document Revised: 02/03/2016 Document Reviewed: 12/26/2015 Elsevier Interactive Patient Education  2017 [place]. Blood Glucose Monitoring, Adult Monitoring your blood sugar (glucose) helps you manage your diabetes. It also helps you and your health care provider determine how well your diabetes management plan is working. Blood glucose monitoring involves checking your blood glucose as often as directed, and keeping a record (log) of your results over time. Why should I monitor my blood glucose? Checking your blood glucose regularly can:  Help you understand how food, exercise, illnesses, and medicines affect your blood glucose.  Let you know what your blood glucose is at any time. You can quickly tell if you are having low blood glucose (hypoglycemia) or high blood glucose (hyperglycemia).  Help you and your health care provider adjust your medicines as needed. When should I check my blood glucose? Follow instructions from your health care provider about how often to check your blood glucose. This may depend on:  The type of diabetes you have.  How well-controlled your diabetes is.  Medicines you are taking. If you have type 1 diabetes:   Check your blood glucose at least 2 times a  day.  Also check your blood glucose:  Before every insulin injection.  Before and after exercise.  Between meals.  2 hours after a meal.  Occasionally between 2:00 a.m. and 3:00 a.m., as directed.  Before potentially dangerous tasks, like driving or using heavy machinery.  At bedtime.  You may need to check your blood glucose more often, up to 6-10 times a day:  If you use an insulin pump.  If you need multiple daily injections (MDI).  If your diabetes is not well-controlled.  If you are ill.  If you have a history of severe hypoglycemia.  If you have a history of not knowing when your blood glucose is getting low (hypoglycemia unawareness). If you have type 2 diabetes:   If you take insulin or other diabetes medicines, check your blood glucose at least 2 times a day.  If you are on intensive insulin therapy, check your blood glucose at least 4 times a day. Occasionally, you may also need to check between 2:00 a.m. and 3:00 a.m., as directed.  Also check your blood glucose:  Before and after exercise.  Before potentially dangerous tasks, like driving or using heavy machinery.  You may need to check your blood glucose more often if:  Your medicine is being adjusted.  Your diabetes is not well-controlled.  You are ill. What is a blood glucose log?  A blood glucose log is a record of your blood glucose readings. It helps you and your health care provider:  Look for patterns in your blood glucose over time.  Adjust your diabetes management plan as needed.  Every time you check your blood glucose, write down your result and notes about things that may be affecting your blood glucose, such as your diet and exercise for the day.  Most glucose meters store a record of glucose readings in the meter. Some meters allow you to download your records to a computer. How do I check my blood glucose? Follow these steps to get accurate readings of your blood  glucose: Supplies needed    Blood glucose meter.  Test strips for your meter. Each meter has its own strips. You must use the strips that come with your meter.  A needle to prick your finger (lancet). Do not use lancets more than once.  A device that holds the lancet (lancing device).  A journal or log book to write down your results. Procedure   Wash your hands with soap and water.  Prick the side of your finger (not the tip) with the lancet. Use a different finger each time.  Gently rub the finger until a small drop of blood appears.  Follow instructions that come with your meter for inserting the test strip, applying blood to the strip, and using your blood glucose meter.  Write down your result and any notes. Alternative testing sites   Some meters allow you to use areas of your body other than your finger (alternative sites) to test your blood.  If you think you may have hypoglycemia, or if you have hypoglycemia unawareness, do not use alternative sites. Use your finger instead.  Alternative sites may not be as accurate as the fingers, because blood flow is slower in these areas. This means that the result you get may be delayed, and it may be different from the result that you would get from your finger.  The most common alternative sites are:  Forearm.  Thigh.  Palm of the hand. Additional tips   Always keep your supplies with you.  If you have questions or need help, all blood glucose meters have a 24-hour "hotline" number that you can call. You may also contact your health care provider.  After you use a few boxes of test strips, adjust (calibrate) your blood glucose meter by following instructions that came with your meter. This information is not intended to replace advice given to you by your health care provider. Make sure you discuss any questions you have with your health care provider. Document Released: 07/19/2003 Document Revised: 02/03/2016 Document  Reviewed: 12/26/2015 Elsevier Interactive Patient Education  2017 [employer].

## 2016-11-07 NOTE — Progress Notes (Signed)
Patient is here for medication FU  Patient denies pain at this time.  Patient has taken medication today. Patient has eaten today.  Patient tolerated 20 units of insulin well today.

## 2016-11-07 NOTE — Progress Notes (Signed)
Preston [name], is a 46 y.o. male  LXB:[id number]  [id number]:[id number]  DOB - October 19, 1970  No chief complaint on file.      Subjective:   Preston [name] is a 46 y.o. 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 Patient was seen in the ED recently for dental pain and throbbing headache, incidentally found to be hyperglycemics, treated and referred to [place] for follow up and proper evaluation. Today, he needs dental referral, otherwise has no new complaint. HbA1C one year ago was 5.6%, today it is 11.7%. Patient has positive family history of Diabetes. He endorses blurry vision but no diplopia. Headache has resolved. Pain in bilateral hips are much improved. No urinary symptom. Patient has No headache, No chest pain, No abdominal pain - No Nausea, No new weakness tingling or numbness, No Cough - SOB.  Problem  New Onset Type 2 Diabetes Mellitus ([facility])    ALLERGIES: Allergies  Allergen Reactions  . Adhesive [Tape] Itching and Rash    Please use "paper" tape.  . Morphine And Related Itching, Nausea And Vomiting and Other (See Comments)    "bad feeling"    PAST MEDICAL HISTORY: Past Medical History:  Diagnosis Date  . Alcohol abuse   . Anxiety   . Arthritis    "hips, knees, back" (03/11/2015)  . Asthma    reports related to anxiety  . CHF (congestive heart failure) ([place])   . Chronic bronchitis ([place])   . Chronic hip pain    "both"  . Chronic lower back pain   . COPD (chronic obstructive pulmonary disease) ([place])   . Diverticulitis   . Diverticulitis   . DJD (degenerative joint disease)   . Dyspnea    with exertion   . GERD (gastroesophageal reflux disease)   . Gout   . History of Clostridium difficile infection   . Hypercholesterolemia   . Hypertension    Does  not see a cardiologist  . Migraine    "new onset" (03/11/2015)  . Panic attacks   . Pneumonia   . Poor dentition     MEDICATIONS AT HOME: Prior to Admission medications   Medication Sig Start Date End Date Taking? Authorizing Provider  acetaminophen-codeine (TYLENOL #3) 300-30 MG tablet Take 1 tablet by mouth every 4 (four) hours as needed. 11/02/16   Tresa Garter, MD  albuterol (PROVENTIL HFA;VENTOLIN HFA) 108 (90 Base) MCG/ACT inhaler Inhale 1-2 puffs into the lungs every 6 (six) hours as needed for wheezing or shortness of breath. Patient taking differently: Inhale 2 puffs into the lungs every 6 (six) hours as needed for wheezing or shortness of breath.  03/29/16   Tresa Garter, MD  allopurinol (ZYLOPRIM) 300 MG tablet Take 1 tablet (300 mg total) by mouth every evening. Patient taking differently: Take 300 mg by mouth at bedtime.  03/29/16   Tresa Garter, MD  amLODipine (NORVASC) 5 MG tablet Take 1 tablet (5 mg total) by mouth daily. Patient taking differently: Take 5 mg by mouth at bedtime.  03/29/16   Tresa Garter, MD  aspirin EC 325 MG tablet Take 1 tablet (325 mg total) by mouth 2 (two) times daily. Patient taking differently: Take 325 mg by mouth at bedtime.  06/25/16   Leighton Parody, PA-C  atorvastatin (LIPITOR) 40 MG tablet  Take 1 tablet (40 mg total) by mouth every evening. Patient taking differently: Take 40 mg by mouth at bedtime.  03/29/16   Tresa Garter, MD  calcium carbonate (TUMS EX) 750 MG chewable tablet Chew 1 tablet by mouth 3 (three) times daily as needed for heartburn.    Historical Provider, MD  colchicine 0.6 MG tablet Take 1 tablet (0.6 mg total) by mouth daily as needed. Patient taking differently: Take 0.6 mg by mouth daily as needed (gout).  03/29/16   Tresa Garter, MD  diazepam (VALIUM) 5 MG tablet Take 1 tablet (5 mg total) by mouth daily as needed for anxiety. Patient taking differently: Take 5 mg by mouth at bedtime as  needed for anxiety (panic attacks).  08/29/16   Tresa Garter, MD  DULoxetine (CYMBALTA) 30 MG capsule Take 1 capsule (30 mg total) by mouth daily. Patient taking differently: Take 30 mg by mouth at bedtime.  03/29/16   Tresa Garter, MD  famotidine (PEPCID) 20 MG tablet Take 1 tablet (20 mg total) by mouth daily. 03/29/16   Tresa Garter, MD  furosemide (LASIX) 40 MG tablet Take 1 tablet (40 mg total) by mouth daily. 03/29/16   Tresa Garter, MD  gabapentin (NEURONTIN) 300 MG capsule Take 1 capsule (300 mg total) by mouth 3 (three) times daily. Patient taking differently: Take 300 mg by mouth See admin instructions. Take 1 capsule (300 mg) by mouth every morning and 2 capsules (600 mg) at night 03/29/16   Tresa Garter, MD  hydrOXYzine (ATARAX/VISTARIL) 25 MG tablet Take 1 tablet (25 mg total) by mouth 3 (three) times daily as needed for anxiety. Patient taking differently: Take 25 mg by mouth at bedtime as needed for anxiety (panic attacks).  03/29/16   Tresa Garter, MD  ibuprofen (ADVIL,MOTRIN) 600 MG tablet Take 1 tablet (600 mg total) by mouth every 6 (six) hours as needed. 11/04/16   Julianne Rice, MD  lisinopril (PRINIVIL,ZESTRIL) 40 MG tablet Take 1 tablet (40 mg total) by mouth daily. Patient taking differently: Take 40 mg by mouth at bedtime.  03/29/16   Tresa Garter, MD  metFORMIN (GLUCOPHAGE) 1000 MG tablet Take 1 tablet (1,000 mg total) by mouth 2 (two) times daily with a meal. 11/07/16   Tresa Garter, MD  metoCLOPramide (REGLAN) 10 MG tablet Take 1 tablet (10 mg total) by mouth every 6 (six) hours as needed for nausea (headache). 11/04/16   Julianne Rice, MD  metoprolol (LOPRESSOR) 50 MG tablet Take 1 tablet (50 mg total) by mouth 2 (two) times daily. 03/29/16   Tresa Garter, MD  tiZANidine (ZANAFLEX) 2 MG tablet Take 1 tablet (2 mg total) by mouth every 6 (six) hours as needed for muscle spasms. 06/25/16   Leighton Parody, PA-C     Objective:   Vitals:   11/07/16 1413  BP: 113/68  Pulse: 81  Resp: 18  Temp: 98.1 F (36.7 C)  TempSrc: Oral  SpO2: 98%  Weight: 275 lb (124.7 kg)  Height: 6\' 2"  (1.88 m)   Exam General appearance : Awake, alert, not in any distress. Speech Clear. Not toxic looking, obese HEENT: Poor dentition, Atraumatic and Normocephalic, pupils equally reactive to light and accomodation Neck: Supple, no JVD. No cervical lymphadenopathy.  Chest: Good air entry bilaterally, no added sounds  CVS: S1 S2 regular, no murmurs.  Abdomen: Bowel sounds present, Non tender and not distended with no gaurding, rigidity or rebound. Extremities: B/L Lower  Ext shows no edema, both legs are warm to touch Neurology: Awake alert, and oriented X 3, CN II-XII intact, Non focal Skin: No Rash  Data Review Lab Results  Component Value Date   HGBA1C 5.6 03/12/2015   HGBA1C 5.6 08/05/2013    Assessment & Plan   1. Essential hypertension, benign  We have discussed target BP range and blood pressure goal. I have advised patient to check BP regularly and to call us back or report to clinic if the numbers are consistently higher than 140/90. We discussed the importance of compliance with medical therapy and DASH diet recommended, consequences of uncontrolled hypertension discussed.  - continue current BP medications  2. Chronic pain syndrome  - Continue Pain medications  3. New onset type 2 diabetes mellitus ([place]): Most likely precipitated by recent surgical stressors  Start - metFORMIN (GLUCOPHAGE) 1000 MG tablet; Take 1 tablet (1,000 mg total) by mouth 2 (two) times daily with a meal.  Dispense: 60 tablet; Refill: 3  - Ambulatory referral to Ophthalmology - Ambulatory referral to Podiatry  Aim for 30 minutes of exercise most days. Rethink what you drink. Water is great! Aim for 2-3 Carb Choices per meal (30-45 grams) +/- 1 either way  Aim for 0-15 Carbs per snack if hungry  Include protein in  moderation with your meals and snacks  Consider reading food labels for Total Carbohydrate and Fat Grams of foods  Consider checking BG at alternate times per day  Continue taking medication as directed Be mindful about how much sugar you are adding to beverages and other foods. Fruit Punch - find one with no sugar  Measure and decrease portions of carbohydrate foods  Make your plate and don't go back for seconds  4. Tooth decayed  - Ambulatory referral to Oral Maxillofacial Surgery  Patient have been counseled extensively about nutrition and exercise. Other issues discussed during this visit include: low cholesterol diet, weight control and daily exercise, foot care, annual eye examinations at Ophthalmology, importance of adherence with medications and regular follow-up. We also discussed long term complications of uncontrolled diabetes and hypertension.   Return in about 4 weeks (around 12/05/2016) for Diabetes, Follow up HTN, Follow up Pain and comorbidities.  The patient was given clear instructions to go to ER or return to medical center if symptoms don't improve, worsen or new problems develop. The patient verbalized understanding. The patient was told to call to get lab results if they haven't heard anything in the next week.   This note has been created with Surveyor, quantity. Any transcriptional errors are unintentional.    Angelica Chessman, MD, [place], Karilyn Cota, [place] and [place], [employer]   11/07/2016, 2:32 PM

## 2016-11-08 ENCOUNTER — Encounter: Payer: Self-pay | Admitting: Internal Medicine

## 2016-11-08 LAB — MICROALBUMIN / CREATININE URINE RATIO
CREATININE, UR: 42.6 mg/dL
Microalb/Creat Ratio: 25.4 mg/g creat (ref 0.0–30.0)
Microalbumin, Urine: 10.8 ug/mL

## 2016-11-20 ENCOUNTER — Encounter: Payer: Self-pay | Admitting: Internal Medicine

## 2016-11-20 DIAGNOSIS — H01021 Squamous blepharitis right upper eyelid: Secondary | ICD-10-CM | POA: Diagnosis not present

## 2016-11-20 DIAGNOSIS — H01024 Squamous blepharitis left upper eyelid: Secondary | ICD-10-CM | POA: Diagnosis not present

## 2016-11-20 DIAGNOSIS — H01022 Squamous blepharitis right lower eyelid: Secondary | ICD-10-CM | POA: Diagnosis not present

## 2016-11-20 DIAGNOSIS — E119 Type 2 diabetes mellitus without complications: Secondary | ICD-10-CM | POA: Diagnosis not present

## 2016-11-20 DIAGNOSIS — H01025 Squamous blepharitis left lower eyelid: Secondary | ICD-10-CM | POA: Diagnosis not present

## 2016-11-20 LAB — HM DIABETES EYE EXAM

## 2016-12-05 ENCOUNTER — Ambulatory Visit: Payer: Medicaid Other | Admitting: Internal Medicine

## 2016-12-06 ENCOUNTER — Encounter: Payer: Self-pay | Admitting: Podiatry

## 2016-12-06 ENCOUNTER — Ambulatory Visit (INDEPENDENT_AMBULATORY_CARE_PROVIDER_SITE_OTHER): Payer: Medicaid Other | Admitting: Podiatry

## 2016-12-06 DIAGNOSIS — E1142 Type 2 diabetes mellitus with diabetic polyneuropathy: Secondary | ICD-10-CM | POA: Diagnosis not present

## 2016-12-06 NOTE — Progress Notes (Signed)
   Subjective:    Patient ID: Preston [name], male    DOB: 31-Aug-1970, 46 y.o.   MRN: [id number]  HPI this patient presents to the office with chief complaint of pain in both of his feet. Patient states that he has severe nerve pain in both feet. He says he is taking 100 mg of gabapentin 13 times a day. He says he was referred by his primary care physician at [facility] to be evaluated for his diabetic foot .  He says that the pain is severe and it extends from his ankles down through his feet. He says that he became diabetic. Due to the fact that he had GI surgery which removed parts of his intestine  He presents the office today for evaluation and treatment of his diabetic neuropathy pain    Review of Systems  All other systems reviewed and are negative.      Objective:   Physical Exam GENERAL APPEARANCE: Alert, conversant. Appropriately groomed. No acute distress.  VASCULAR: Pedal pulses are  palpable at  [facility] and PT bilateral.  Capillary refill time is immediate to all digits,  Normal temperature gradient.   NEUROLOGIC: sensation is diminished  to 5.07 monofilament at 5/5 sites bilateral.  Light touch is diminished  bilateral, Muscle strength normal.  MUSCULOSKELETAL: acceptable muscle strength, tone and stability bilateral.  Intrinsic muscluature intact bilateral.  Rectus appearance of foot and digits noted bilateral.   DERMATOLOGIC: skin color, texture, and turgor are within normal limits.  No preulcerative lesions or ulcers  are seen, no interdigital maceration noted.  No open lesions present.  Digital nails are asymptomatic. No drainage noted.         Assessment & Plan:  Diabetic neuropathy.    IE  After conferring with Dr. Milinda Pointer. We determined that he was only taking 300 mg of gabapentin to control his diabetic pain since the maximum dosage 3600 mg daily. I instructed the patient to take 2 tablets 3 times a day. This would be half   Of the maximum dosage for this  medication. He is to return to the office when necessary   Gardiner Barefoot DPM

## 2016-12-12 ENCOUNTER — Observation Stay (HOSPITAL_COMMUNITY): Payer: Medicaid Other

## 2016-12-12 ENCOUNTER — Encounter (HOSPITAL_COMMUNITY): Payer: Self-pay | Admitting: *Deleted

## 2016-12-12 ENCOUNTER — Telehealth: Payer: Self-pay | Admitting: Internal Medicine

## 2016-12-12 ENCOUNTER — Inpatient Hospital Stay (HOSPITAL_COMMUNITY)
Admission: EM | Admit: 2016-12-12 | Discharge: 2016-12-14 | DRG: 638 | Disposition: A | Discharge status: Home / Self Care | Payer: Medicaid Other | Attending: Internal Medicine | Admitting: Internal Medicine

## 2016-12-12 DIAGNOSIS — I110 Hypertensive heart disease with heart failure: Secondary | ICD-10-CM | POA: Diagnosis present

## 2016-12-12 DIAGNOSIS — I10 Essential (primary) hypertension: Secondary | ICD-10-CM | POA: Diagnosis not present

## 2016-12-12 DIAGNOSIS — Z96643 Presence of artificial hip joint, bilateral: Secondary | ICD-10-CM | POA: Diagnosis present

## 2016-12-12 DIAGNOSIS — L739 Follicular disorder, unspecified: Secondary | ICD-10-CM

## 2016-12-12 DIAGNOSIS — N179 Acute kidney failure, unspecified: Secondary | ICD-10-CM | POA: Diagnosis present

## 2016-12-12 DIAGNOSIS — Z885 Allergy status to narcotic agent status: Secondary | ICD-10-CM

## 2016-12-12 DIAGNOSIS — L02421 Furuncle of right axilla: Secondary | ICD-10-CM | POA: Diagnosis present

## 2016-12-12 DIAGNOSIS — Z91048 Other nonmedicinal substance allergy status: Secondary | ICD-10-CM

## 2016-12-12 DIAGNOSIS — E1110 Type 2 diabetes mellitus with ketoacidosis without coma: Principal | ICD-10-CM | POA: Diagnosis present

## 2016-12-12 DIAGNOSIS — K219 Gastro-esophageal reflux disease without esophagitis: Secondary | ICD-10-CM | POA: Diagnosis present

## 2016-12-12 DIAGNOSIS — Z6831 Body mass index (BMI) 31.0-31.9, adult: Secondary | ICD-10-CM

## 2016-12-12 DIAGNOSIS — E876 Hypokalemia: Secondary | ICD-10-CM | POA: Diagnosis present

## 2016-12-12 DIAGNOSIS — K029 Dental caries, unspecified: Secondary | ICD-10-CM | POA: Diagnosis not present

## 2016-12-12 DIAGNOSIS — Z79899 Other long term (current) drug therapy: Secondary | ICD-10-CM

## 2016-12-12 DIAGNOSIS — M1990 Unspecified osteoarthritis, unspecified site: Secondary | ICD-10-CM | POA: Diagnosis present

## 2016-12-12 DIAGNOSIS — Z87891 Personal history of nicotine dependence: Secondary | ICD-10-CM

## 2016-12-12 DIAGNOSIS — E785 Hyperlipidemia, unspecified: Secondary | ICD-10-CM | POA: Diagnosis present

## 2016-12-12 DIAGNOSIS — I519 Heart disease, unspecified: Secondary | ICD-10-CM

## 2016-12-12 DIAGNOSIS — J45909 Unspecified asthma, uncomplicated: Secondary | ICD-10-CM | POA: Diagnosis present

## 2016-12-12 DIAGNOSIS — I5032 Chronic diastolic (congestive) heart failure: Secondary | ICD-10-CM | POA: Diagnosis present

## 2016-12-12 DIAGNOSIS — F410 Panic disorder [episodic paroxysmal anxiety] without agoraphobia: Secondary | ICD-10-CM | POA: Diagnosis present

## 2016-12-12 DIAGNOSIS — Z7984 Long term (current) use of oral hypoglycemic drugs: Secondary | ICD-10-CM

## 2016-12-12 DIAGNOSIS — E66812 Obesity, class 2: Secondary | ICD-10-CM | POA: Diagnosis present

## 2016-12-12 DIAGNOSIS — K05219 Aggressive periodontitis, localized, unspecified severity: Secondary | ICD-10-CM | POA: Diagnosis present

## 2016-12-12 DIAGNOSIS — E1142 Type 2 diabetes mellitus with diabetic polyneuropathy: Secondary | ICD-10-CM | POA: Diagnosis present

## 2016-12-12 DIAGNOSIS — Z7982 Long term (current) use of aspirin: Secondary | ICD-10-CM

## 2016-12-12 DIAGNOSIS — F1011 Alcohol abuse, in remission: Secondary | ICD-10-CM | POA: Diagnosis present

## 2016-12-12 DIAGNOSIS — Z79891 Long term (current) use of opiate analgesic: Secondary | ICD-10-CM

## 2016-12-12 DIAGNOSIS — G43909 Migraine, unspecified, not intractable, without status migrainosus: Secondary | ICD-10-CM | POA: Diagnosis present

## 2016-12-12 DIAGNOSIS — E669 Obesity, unspecified: Secondary | ICD-10-CM | POA: Diagnosis present

## 2016-12-12 DIAGNOSIS — J449 Chronic obstructive pulmonary disease, unspecified: Secondary | ICD-10-CM | POA: Diagnosis present

## 2016-12-12 LAB — CBC
HEMATOCRIT: 45.8 % (ref 39.0–52.0)
HEMOGLOBIN: 15.7 g/dL (ref 13.0–17.0)
MCH: 30.1 pg (ref 26.0–34.0)
MCHC: 34.3 g/dL (ref 30.0–36.0)
MCV: 87.9 fL (ref 78.0–100.0)
Platelets: 279 10*3/uL (ref 150–400)
RBC: 5.21 MIL/uL (ref 4.22–5.81)
RDW: 14.2 % (ref 11.5–15.5)
WBC: 11.8 10*3/uL — AB (ref 4.0–10.5)

## 2016-12-12 LAB — BASIC METABOLIC PANEL
ANION GAP: 24 — AB (ref 5–15)
Anion gap: 19 — ABNORMAL HIGH (ref 5–15)
BUN: 15 mg/dL (ref 6–20)
BUN: 16 mg/dL (ref 6–20)
CHLORIDE: 95 mmol/L — AB (ref 101–111)
CHLORIDE: 102 mmol/L (ref 101–111)
CO2: 12 mmol/L — ABNORMAL LOW (ref 22–32)
CO2: 11 mmol/L — ABNORMAL LOW (ref 22–32)
Calcium: 10.4 mg/dL — ABNORMAL HIGH (ref 8.9–10.3)
Calcium: 9.4 mg/dL (ref 8.9–10.3)
Creatinine, Ser: 1.52 mg/dL — ABNORMAL HIGH (ref 0.61–1.24)
Creatinine, Ser: 1.28 mg/dL — ABNORMAL HIGH (ref 0.61–1.24)
GFR calc Af Amer: >60 mL/min (ref >60)
GFR calc non Af Amer: >60 mL/min (ref >60)
GFR, EST NON AFRICAN AMERICAN: 54 mL/min — AB (ref >60)
GLUCOSE: 251 mg/dL — AB (ref 65–99)
Glucose, Bld: 514 mg/dL (ref 65–99)
POTASSIUM: 5 mmol/L (ref 3.5–5.1)
POTASSIUM: 4.5 mmol/L (ref 3.5–5.1)
SODIUM: 131 mmol/L — AB (ref 135–145)
Sodium: 132 mmol/L — ABNORMAL LOW (ref 135–145)

## 2016-12-12 LAB — PHOSPHORUS: Phosphorus: 2.6 mg/dL (ref 2.5–4.6)

## 2016-12-12 LAB — URINALYSIS, ROUTINE W REFLEX MICROSCOPIC
BACTERIA UA: NONE SEEN
BILIRUBIN URINE: NEGATIVE
Glucose, UA: >=500 mg/dL — AB
HGB URINE DIPSTICK: NEGATIVE
KETONES UR: 80 mg/dL — AB
LEUKOCYTES UA: NEGATIVE
NITRITE: NEGATIVE
Protein, ur: NEGATIVE mg/dL
SPECIFIC GRAVITY, URINE: 1.026 (ref 1.005–1.030)
pH: 5 (ref 5.0–8.0)

## 2016-12-12 LAB — GLUCOSE, CAPILLARY
GLUCOSE-CAPILLARY: 235 mg/dL — AB (ref 65–99)
GLUCOSE-CAPILLARY: 230 mg/dL — AB (ref 65–99)
Glucose-Capillary: 281 mg/dL — ABNORMAL HIGH (ref 65–99)
Glucose-Capillary: 249 mg/dL — ABNORMAL HIGH (ref 65–99)
Glucose-Capillary: 267 mg/dL — ABNORMAL HIGH (ref 65–99)
Glucose-Capillary: 229 mg/dL — ABNORMAL HIGH (ref 65–99)

## 2016-12-12 LAB — BLOOD GAS, VENOUS
Acid-base deficit: 10.9 mmol/L — ABNORMAL HIGH (ref 0.0–2.0)
Bicarbonate: 15.2 mmol/L — ABNORMAL LOW (ref 20.0–28.0)
O2 Saturation: 73 %
Patient temperature: 98.6
pCO2, Ven: 37.7 mmHg — ABNORMAL LOW (ref 44.0–60.0)
pH, Ven: 7.23 — ABNORMAL LOW (ref 7.250–7.430)
pO2, Ven: 43.2 mmHg (ref 32.0–45.0)

## 2016-12-12 LAB — I-STAT TROPONIN, ED: Troponin i, poc: 0 ng/mL (ref 0.00–0.08)

## 2016-12-12 LAB — CBG MONITORING, ED
GLUCOSE-CAPILLARY: 567 mg/dL — AB (ref 65–99)
GLUCOSE-CAPILLARY: 500 mg/dL — AB (ref 65–99)
GLUCOSE-CAPILLARY: 331 mg/dL — AB (ref 65–99)
Glucose-Capillary: 536 mg/dL (ref 65–99)
Glucose-Capillary: 474 mg/dL — ABNORMAL HIGH (ref 65–99)
Glucose-Capillary: 477 mg/dL — ABNORMAL HIGH (ref 65–99)

## 2016-12-12 LAB — MRSA PCR SCREENING

## 2016-12-12 LAB — MAGNESIUM: Magnesium: 1.5 mg/dL — ABNORMAL LOW (ref 1.7–2.4)

## 2016-12-12 LAB — LIPASE, BLOOD: Lipase: <10 U/L — ABNORMAL LOW (ref 11–51)

## 2016-12-12 MED ORDER — CLINDAMYCIN PHOSPHATE 600 MG/50ML IV SOLN
600.0000 mg | Freq: Three times a day (TID) | INTRAVENOUS | Status: DC
  Administered 2016-12-13 – 2016-12-14 (×4): 600 mg via INTRAVENOUS
  Filled 2016-12-12 (×4): qty 50

## 2016-12-12 MED ORDER — ONDANSETRON HCL 4 MG/2ML IJ SOLN: 4.0000 mg | Freq: Four times a day (QID) | INTRAMUSCULAR | Status: DC | PRN

## 2016-12-12 MED ORDER — SODIUM CHLORIDE 0.9 % IV BOLUS (SEPSIS)
1000.0000 mL | Freq: Once | INTRAVENOUS | Status: AC
  Administered 2016-12-12: 1000 mL via INTRAVENOUS

## 2016-12-12 MED ORDER — SODIUM CHLORIDE 0.9 % IV SOLN: INTRAVENOUS | Status: DC

## 2016-12-12 MED ORDER — FAMOTIDINE 20 MG PO TABS
20.0000 mg | ORAL_TABLET | Freq: Every day | ORAL | Status: DC
  Administered 2016-12-12 – 2016-12-14 (×3): 20 mg via ORAL
  Filled 2016-12-12 (×3): qty 1

## 2016-12-12 MED ORDER — ENOXAPARIN SODIUM 40 MG/0.4ML ~~LOC~~ SOLN
40.0000 mg | SUBCUTANEOUS | Status: DC
  Administered 2016-12-12 – 2016-12-13 (×2): 40 mg via SUBCUTANEOUS
  Filled 2016-12-12 (×2): qty 0.4

## 2016-12-12 MED ORDER — ACETAMINOPHEN-CODEINE #3 300-30 MG PO TABS
1.0000 | ORAL_TABLET | ORAL | Status: DC | PRN
  Administered 2016-12-12 – 2016-12-13 (×4): 1 via ORAL
  Filled 2016-12-12 (×4): qty 1

## 2016-12-12 MED ORDER — PROMETHAZINE HCL 25 MG/ML IJ SOLN
25.0000 mg | Freq: Once | INTRAMUSCULAR | Status: AC
Start: 2016-12-12 — End: 2016-12-12
  Administered 2016-12-12: 25 mg via INTRAVENOUS
  Filled 2016-12-12: qty 1

## 2016-12-12 MED ORDER — ALBUTEROL SULFATE (2.5 MG/3ML) 0.083% IN NEBU: 3.0000 mL | INHALATION_SOLUTION | Freq: Four times a day (QID) | RESPIRATORY_TRACT | Status: DC | PRN

## 2016-12-12 MED ORDER — SODIUM CHLORIDE 0.9 % IV SOLN
INTRAVENOUS | Status: DC
  Administered 2016-12-12: 4.4 [IU]/h via INTRAVENOUS
  Filled 2016-12-12: qty 1

## 2016-12-12 MED ORDER — ONDANSETRON HCL 4 MG/2ML IJ SOLN
4.0000 mg | Freq: Once | INTRAMUSCULAR | Status: AC
  Administered 2016-12-12: 4 mg via INTRAVENOUS
  Filled 2016-12-12: qty 2

## 2016-12-12 MED ORDER — TIZANIDINE HCL 2 MG PO TABS
2.0000 mg | ORAL_TABLET | Freq: Four times a day (QID) | ORAL | Status: DC | PRN
Start: 2016-12-12 — End: 2016-12-14
  Administered 2016-12-13 (×2): 2 mg via ORAL
  Filled 2016-12-12 (×2): qty 1

## 2016-12-12 MED ORDER — INSULIN ASPART 100 UNIT/ML ~~LOC~~ SOLN
12.0000 [IU] | Freq: Once | SUBCUTANEOUS | Status: AC
  Administered 2016-12-12: 12 [IU] via INTRAVENOUS
  Filled 2016-12-12: qty 1

## 2016-12-12 MED ORDER — ALLOPURINOL 300 MG PO TABS
300.0000 mg | ORAL_TABLET | Freq: Every day | ORAL | Status: DC
  Administered 2016-12-12 – 2016-12-13 (×2): 300 mg via ORAL
  Filled 2016-12-12 (×2): qty 1

## 2016-12-12 MED ORDER — DIAZEPAM 5 MG PO TABS
5.0000 mg | ORAL_TABLET | Freq: Every evening | ORAL | Status: DC | PRN
  Administered 2016-12-12: 5 mg via ORAL
  Filled 2016-12-12: qty 1

## 2016-12-12 MED ORDER — INSULIN REGULAR HUMAN 100 UNIT/ML IJ SOLN
INTRAMUSCULAR | Status: DC
  Filled 2016-12-12 (×3): qty 1

## 2016-12-12 MED ORDER — GABAPENTIN 300 MG PO CAPS
300.0000 mg | ORAL_CAPSULE | Freq: Every day | ORAL | Status: DC
  Administered 2016-12-13 – 2016-12-14 (×2): 300 mg via ORAL
  Filled 2016-12-12 (×3): qty 1

## 2016-12-12 MED ORDER — PROMETHAZINE HCL 25 MG/ML IJ SOLN: 25.0000 mg | Freq: Four times a day (QID) | INTRAMUSCULAR | Status: DC | PRN

## 2016-12-12 MED ORDER — GABAPENTIN 300 MG PO CAPS
600.0000 mg | ORAL_CAPSULE | Freq: Every day | ORAL | Status: DC
  Administered 2016-12-12 – 2016-12-13 (×2): 600 mg via ORAL
  Filled 2016-12-12 (×2): qty 2

## 2016-12-12 MED ORDER — CALCIUM CARBONATE ANTACID 500 MG PO CHEW: 1.0000 | CHEWABLE_TABLET | Freq: Three times a day (TID) | ORAL | Status: DC | PRN

## 2016-12-12 MED ORDER — DULOXETINE HCL 30 MG PO CPEP
30.0000 mg | ORAL_CAPSULE | Freq: Every day | ORAL | Status: DC
  Administered 2016-12-12 – 2016-12-13 (×2): 30 mg via ORAL
  Filled 2016-12-12 (×2): qty 1

## 2016-12-12 MED ORDER — CLINDAMYCIN PHOSPHATE 600 MG/50ML IV SOLN
600.0000 mg | Freq: Three times a day (TID) | INTRAVENOUS | Status: DC
  Administered 2016-12-12: 600 mg via INTRAVENOUS
  Filled 2016-12-12: qty 50

## 2016-12-12 MED ORDER — DEXTROSE-NACL 5-0.45 % IV SOLN
INTRAVENOUS | Status: DC
Start: 2016-12-12 — End: 2016-12-13
  Administered 2016-12-12 (×2): via INTRAVENOUS

## 2016-12-12 NOTE — ED Notes (Signed)
Attempted report x1. 

## 2016-12-12 NOTE — ED Provider Notes (Signed)
Plains of generalized weakness and lightheadedness and nausea since yesterday. He vomited yesterday. Slight nausea present denies noncompliance with medications. No other associated symptoms. Lightheadedness worse with standing improve with lying supine. On exam alert nontoxic HEENT exam poor dentition no facial asymmetry mucous membranes dry neck supple lungs clear auscultation heart regular rate and rhythm abdomen obese nontender extremities without edema   Orlie Dakin, MD 12/12/16 1752

## 2016-12-12 NOTE — ED Notes (Signed)
Blood sugar 477

## 2016-12-12 NOTE — ED Provider Notes (Signed)
[facility] DEPT Provider Note   CSN: [id number] Arrival date & time: 12/12/16  1043   History   Chief Complaint Chief Complaint  Patient presents with  . Hyperglycemia    HPI Preston [name] is a 46 y.o. male.  HPI  46 year old male with a history of CHF, alcohol abuse, COPD, migraines, hypertension, type 2 diabetes presents today with complaints of elevated blood sugar. Patient notes over the last several days he's been feeling ill, had several episodes of vomiting. Patient notes that he has had decent decreased appetite. Patient's blood sugar Was in the 500s earlier today. Patient notes that he is taking metformin as directed. Denies any drug or alcohol use. He denies any urinary changes, fever, abdominal pain.   Patient currently followed by Dr. Doreene Burke- A1c from one year ago was 5.6; April of this year 11.7.  Past Medical History:  Diagnosis Date  . Alcohol abuse   . Anxiety   . Arthritis    "hips, knees, back" (03/11/2015)  . Asthma    reports related to anxiety  . CHF (congestive heart failure) ([place])   . Chronic bronchitis ([place])   . Chronic hip pain    "both"  . Chronic lower back pain   . COPD (chronic obstructive pulmonary disease) ([place])   . Diabetes mellitus without complication ([place])   . Diverticulitis   . Diverticulitis   . DJD (degenerative joint disease)   . Dyspnea    with exertion   . GERD (gastroesophageal reflux disease)   . Gout   . History of Clostridium difficile infection   . Hypercholesterolemia   . Hypertension    Does not see a cardiologist  . Migraine    "new onset" (03/11/2015)  . Panic attacks   . Pneumonia   . Poor dentition     Patient Active Problem List   Diagnosis Date Noted  . DKA, type 2 ([place]) 12/12/2016  . Left ventricular diastolic dysfunction 40/98/1191  . Hypertension 12/12/2016  . Asthma 12/12/2016  . Acute kidney injury ([place]) 12/12/2016  . Arthritis 12/12/2016  . Obesity (BMI 30.0-34.9) 12/12/2016  .  Folliculitis of right axilla 12/12/2016  . Dental caries 12/12/2016  . New onset type 2 diabetes mellitus ([place]) 11/07/2016  . Primary osteoarthritis of right hip 09/07/2016  . Primary localized osteoarthritis of left hip 06/25/2016  . Primary osteoarthritis of left hip 06/24/2016  . Tobacco use disorder 10/13/2015  . Gastroesophageal reflux disease without esophagitis 07/14/2015  . Essential hypertension, benign 03/24/2015  . Chronic diastolic congestive heart failure ([place]) 03/24/2015  . Morbid obesity ([place]) 03/24/2015  . Nicotine abuse 03/24/2015  . Generalized anxiety disorder 03/24/2015  . Primary gout 03/24/2015  . Chronic pain syndrome 03/24/2015  . COPD (chronic obstructive pulmonary disease) ([place])   . HLD (hyperlipidemia) 03/11/2015  . GERD (gastroesophageal reflux disease) 03/11/2015  . Depression 03/11/2015  . SOB (shortness of breath) 03/11/2015  . Bilateral leg edema 03/11/2015  . Alcohol abuse 03/11/2015  . Elevated troponin 03/11/2015  . Diverticulitis large intestine 07/08/2013  . Personal history of colonic polyps 03/10/2013  . Diverticulitis 02/26/2013  . Diverticulosis 02/03/2013  . Tobacco abuse 12/30/2012  . Gout     Past Surgical History:  Procedure Laterality Date  . APPENDECTOMY    . COLON SURGERY     14 inches of colon removed  . COLONOSCOPY WITH PROPOFOL N/A 04/01/2013   Procedure: COLONOSCOPY WITH PROPOFOL;  Surgeon: Arta Silence, MD;  Location: [facility] ENDOSCOPY;  Service: Endoscopy;  Laterality:  N/A;  . FLEXIBLE SIGMOIDOSCOPY N/A 02/28/2013   Procedure: FLEXIBLE SIGMOIDOSCOPY;  Surgeon: Winfield Cunas., MD;  Location: [facility] ENDOSCOPY;  Service: Endoscopy;  Laterality: N/A;  . LAPAROSCOPIC LOW ANTERIOR RESECTION N/A 07/08/2013   Procedure: LAPAROSCOPIC LOW ANTERIOR RESECTION WITH TAKEDOWN OF SPLENIC FLEXURE.;  Surgeon: Adin Hector, MD;  Location: [facility];  Service: General;  Laterality: N/A;  . MANDIBLE SURGERY Left 2010   "dry skin pocket cut out"    . TOTAL HIP ARTHROPLASTY Left 06/25/2016   Procedure: TOTAL HIP ARTHROPLASTY ANTERIOR APPROACH;  Surgeon: Frederik Pear, MD;  Location: [facility];  Service: Orthopedics;  Laterality: Left;  . TOTAL HIP ARTHROPLASTY Right 09/10/2016   Procedure: TOTAL HIP ARTHROPLASTY ANTERIOR APPROACH;  Surgeon: Frederik Pear, MD;  Location: [facility];  Service: Orthopedics;  Laterality: Right;       Home Medications    Prior to Admission medications   Medication Sig Start Date End Date Taking? Authorizing Provider  acetaminophen-codeine (TYLENOL #3) 300-30 MG tablet Take 1 tablet by mouth every 4 (four) hours as needed. 11/02/16  Yes Tresa Garter, MD  albuterol (PROVENTIL HFA;VENTOLIN HFA) 108 (90 Base) MCG/ACT inhaler Inhale 1-2 puffs into the lungs every 6 (six) hours as needed for wheezing or shortness of breath. Patient taking differently: Inhale 2 puffs into the lungs every 6 (six) hours as needed for wheezing or shortness of breath.  03/29/16  Yes Tresa Garter, MD  allopurinol (ZYLOPRIM) 300 MG tablet Take 1 tablet (300 mg total) by mouth every evening. Patient taking differently: Take 300 mg by mouth at bedtime.  03/29/16  Yes Jegede, Olugbemiga E, MD  amLODipine (NORVASC) 5 MG tablet Take 1 tablet (5 mg total) by mouth daily. Patient taking differently: Take 5 mg by mouth at bedtime.  03/29/16  Yes Tresa Garter, MD  aspirin EC 325 MG tablet Take 1 tablet (325 mg total) by mouth 2 (two) times daily. Patient taking differently: Take 325 mg by mouth at bedtime.  06/25/16  Yes Joanell Rising K, PA-C  atorvastatin (LIPITOR) 40 MG tablet Take 1 tablet (40 mg total) by mouth every evening. Patient taking differently: Take 40 mg by mouth at bedtime.  03/29/16  Yes Tresa Garter, MD  Blood Glucose Monitoring Suppl (ACCU-CHEK AVIVA PLUS) w/Device KIT 1 each by Does not apply route 3 (three) times daily. 11/07/16  Yes Tresa Garter, MD  calcium carbonate (TUMS EX) 750 MG chewable tablet  Chew 1 tablet by mouth 3 (three) times daily as needed for heartburn.   Yes [provider]  colchicine 0.6 MG tablet Take 1 tablet (0.6 mg total) by mouth daily as needed. Patient taking differently: Take 0.6 mg by mouth daily as needed (gout).  03/29/16  Yes Jegede, Marlena Clipper, MD  diazepam (VALIUM) 5 MG tablet Take 1 tablet (5 mg total) by mouth daily as needed for anxiety. Patient taking differently: Take 5 mg by mouth at bedtime as needed for anxiety (panic attacks).  08/29/16  Yes Tresa Garter, MD  DULoxetine (CYMBALTA) 30 MG capsule Take 1 capsule (30 mg total) by mouth daily. Patient taking differently: Take 30 mg by mouth at bedtime.  03/29/16  Yes Tresa Garter, MD  famotidine (PEPCID) 20 MG tablet Take 1 tablet (20 mg total) by mouth daily. 03/29/16  Yes Tresa Garter, MD  furosemide (LASIX) 40 MG tablet Take 1 tablet (40 mg total) by mouth daily. 03/29/16  Yes Tresa Garter, MD  gabapentin (  NEURONTIN) 300 MG capsule Take 1 capsule (300 mg total) by mouth 3 (three) times daily. Patient taking differently: Take 300 mg by mouth See admin instructions. Take 1 capsule (300 mg) by mouth every morning and 2 capsules (600 mg) at night 03/29/16  Yes Jegede, Olugbemiga E, MD  glucose blood test strip Use as instructed 11/07/16  Yes Jegede, Olugbemiga E, MD  hydrOXYzine (ATARAX/VISTARIL) 25 MG tablet Take 1 tablet (25 mg total) by mouth 3 (three) times daily as needed for anxiety. Patient taking differently: Take 25 mg by mouth at bedtime as needed for anxiety (panic attacks).  03/29/16  Yes Tresa Garter, MD  ibuprofen (ADVIL,MOTRIN) 600 MG tablet Take 1 tablet (600 mg total) by mouth every 6 (six) hours as needed. Patient taking differently: Take 600 mg by mouth every 6 (six) hours as needed for moderate pain.  11/04/16  Yes Julianne Rice, MD  Lancet Devices [facility]) lancets Use as instructed 11/07/16  Yes Tresa Garter, MD  lisinopril  (PRINIVIL,ZESTRIL) 40 MG tablet Take 1 tablet (40 mg total) by mouth daily. Patient taking differently: Take 40 mg by mouth at bedtime.  03/29/16  Yes Tresa Garter, MD  metFORMIN (GLUCOPHAGE) 1000 MG tablet Take 1 tablet (1,000 mg total) by mouth 2 (two) times daily with a meal. 11/07/16  Yes Jegede, Olugbemiga E, MD  metoCLOPramide (REGLAN) 10 MG tablet Take 1 tablet (10 mg total) by mouth every 6 (six) hours as needed for nausea (headache). 11/04/16  Yes Julianne Rice, MD  metoprolol (LOPRESSOR) 50 MG tablet Take 1 tablet (50 mg total) by mouth 2 (two) times daily. 03/29/16  Yes Tresa Garter, MD  tiZANidine (ZANAFLEX) 2 MG tablet Take 1 tablet (2 mg total) by mouth every 6 (six) hours as needed for muscle spasms. 06/25/16  Yes Leighton Parody, PA-C    Family History Family History  Problem Relation Age of Onset  . Cancer Father        lymphoma  . Heart attack Mother     Social History Social History  Substance Use Topics  . Smoking status: Former Smoker    Packs/day: 1.00    Years: 30.00    Types: Cigarettes    Quit date: 06/11/2016  . Smokeless tobacco: Former Systems developer    Types: Chew     Comment: "quit chewing in the 1990's"  . Alcohol use 3.6 oz/week    6 Cans of beer per week     Comment: 6 beers a day, as of 08/29/16 down to 2 beers     Allergies   Adhesive [tape] and Morphine and related   Review of Systems Review of Systems  All other systems reviewed and are negative.    Physical Exam Updated Vital Signs BP 121/82 (BP Location: Right Arm)   Pulse 99   Temp 97.3 F (36.3 C) (Oral)   Resp 18   Ht _0  (1.88 m)   Wt 111.6 kg   SpO2 96%   BMI 31.58 kg/m   Physical Exam  Constitutional: He is oriented to person, place, and time. He appears well-developed and well-nourished.  HENT:  Head: Normocephalic and atraumatic.  Eyes: Conjunctivae are normal. Pupils are equal, round, and reactive to light. Right eye exhibits no discharge. Left eye  exhibits no discharge. No scleral icterus.  Neck: Normal range of motion. No JVD present. No tracheal deviation present.  Cardiovascular: Normal rate.   Pulmonary/Chest: Effort normal and breath sounds normal. No stridor. No  respiratory distress. He has no wheezes. He has no rales. He exhibits no tenderness.  Abdominal: Soft. Bowel sounds are normal. He exhibits no distension.  Neurological: He is alert and oriented to person, place, and time. Coordination normal.  Skin: Skin is warm and dry.  Psychiatric: He has a normal mood and affect. His behavior is normal. Judgment and thought content normal.  Nursing note and vitals reviewed.    ED Treatments / Results  Labs (all labs ordered are listed, but only abnormal results are displayed) Labs Reviewed  BASIC METABOLIC PANEL - Abnormal; Notable for the following:       Result Value   Sodium 131 (*)    Chloride 95 (*)    CO2 12 (*)    Glucose, Bld 514 (*)    Creatinine, Ser 1.52 (*)    Calcium 10.4 (*)    GFR calc non Af Amer 54 (*)    Anion gap 24 (*)    All other components within normal limits  CBC - Abnormal; Notable for the following:    WBC 11.8 (*)    All other components within normal limits  URINALYSIS, ROUTINE W REFLEX MICROSCOPIC - Abnormal; Notable for the following:    Color, Urine STRAW (*)    Glucose, UA >=500 (*)    Ketones, ur 80 (*)    Squamous Epithelial / LPF 0-5 (*)    All other components within normal limits  BLOOD GAS, VENOUS - Abnormal; Notable for the following:    pH, Ven 7.230 (*)    pCO2, Ven 37.7 (*)    Bicarbonate 15.2 (*)    Acid-base deficit 10.9 (*)    All other components within normal limits  GLUCOSE, CAPILLARY - Abnormal; Notable for the following:    Glucose-Capillary 281 (*)    All other components within normal limits  GLUCOSE, CAPILLARY - Abnormal; Notable for the following:    Glucose-Capillary 235 (*)    All other components within normal limits  CBG MONITORING, ED - Abnormal;  Notable for the following:    Glucose-Capillary 567 (*)    All other components within normal limits  CBG MONITORING, ED - Abnormal; Notable for the following:    Glucose-Capillary 536 (*)    All other components within normal limits  CBG MONITORING, ED - Abnormal; Notable for the following:    Glucose-Capillary 500 (*)    All other components within normal limits  CBG MONITORING, ED - Abnormal; Notable for the following:    Glucose-Capillary 474 (*)    All other components within normal limits  CBG MONITORING, ED - Abnormal; Notable for the following:    Glucose-Capillary 477 (*)    All other components within normal limits  CBG MONITORING, ED - Abnormal; Notable for the following:    Glucose-Capillary 331 (*)    All other components within normal limits  MRSA PCR SCREENING  C-PEPTIDE  LIPASE, BLOOD  HIV ANTIBODY (ROUTINE TESTING)  BASIC METABOLIC PANEL  BASIC METABOLIC PANEL  BASIC METABOLIC PANEL  BASIC METABOLIC PANEL  BETA-HYDROXYBUTYRIC ACID  MAGNESIUM  PHOSPHORUS  LIPID PANEL  I-STAT TROPOININ, ED    EKG  EKG Interpretation None         Radiology Dg Orthopantogram  Result Date: 12/12/2016 CLINICAL DATA:  Dental caries.  Infection. EXAM: ORTHOPANTOGRAM/PANORAMIC COMPARISON:  CT 01/05/2011. FINDINGS: Multifocal dental disease/carries. Multifocal periapical lucencies are noted along the right and left remaining teeth. Periapical lucencies are most prominent about the remaining left lower pre molars  and right lower molars. Lucency is noted in the right inferior mandibular ramus. This could be related osteomyelitis. IMPRESSION: Severe dental disease with multifocal periapical lucencies as described above. Lucency is noted in the right inferior mandibular ramus. This could be related osteomyelitis. Electronically Signed   By: Marcello Moores  Register   On: 12/12/2016 17:26    Procedures Procedures (including critical care time)  CRITICAL CARE Performed by: Elmer Ramp   Total critical care time: 35 minutes  Critical care time was exclusive of separately billable procedures and treating other patients.  Critical care was necessary to treat or prevent imminent or life-threatening deterioration.  Critical care was time spent personally by me on the following activities: development of treatment plan with patient and/or surrogate as well as nursing, discussions with consultants, evaluation of patient's response to treatment, examination of patient, obtaining history from patient or surrogate, ordering and performing treatments and interventions, ordering and review of laboratory studies, ordering and review of radiographic studies, pulse oximetry and re-evaluation of patient's condition.  Medications Ordered in ED Medications  calcium carbonate (TUMS - dosed in mg elemental calcium) chewable tablet 200 mg of elemental calcium (not administered)  diazepam (VALIUM) tablet 5 mg (5 mg Oral Given 12/12/16 1936)  tiZANidine (ZANAFLEX) tablet 2 mg (not administered)  albuterol (PROVENTIL) (2.5 MG/3ML) 0.083% nebulizer solution 3 mL (not administered)  allopurinol (ZYLOPRIM) tablet 300 mg (not administered)  DULoxetine (CYMBALTA) DR capsule 30 mg (not administered)  famotidine (PEPCID) tablet 20 mg (20 mg Oral Given 12/12/16 1907)  gabapentin (NEURONTIN) capsule 300 mg (not administered)  dextrose 5 %-0.45 % sodium chloride infusion ( Intravenous New Bag/Given 12/12/16 1906)  insulin regular (NOVOLIN R,HUMULIN R) 100 Units in sodium chloride 0.9 % 100 mL (1 Units/mL) infusion (8.8 Units/hr Intravenous Rate/Dose Change 12/12/16 1901)  enoxaparin (LOVENOX) injection 40 mg (not administered)  0.9 %  sodium chloride infusion (not administered)  clindamycin (CLEOCIN) IVPB 600 mg (0 mg Intravenous Stopped 12/12/16 1700)  ondansetron (ZOFRAN) injection 4 mg (not administered)    Or  promethazine (PHENERGAN) injection 25 mg (not administered)  gabapentin (NEURONTIN)  capsule 600 mg (not administered)  sodium chloride 0.9 % bolus 1,000 mL (0 mLs Intravenous Stopped 12/12/16 1448)  insulin aspart (novoLOG) injection 12 Units (12 Units Intravenous Given 12/12/16 1149)  ondansetron (ZOFRAN) injection 4 mg (4 mg Intravenous Given 12/12/16 1149)  promethazine (PHENERGAN) injection 25 mg (25 mg Intravenous Given 12/12/16 1432)  sodium chloride 0.9 % bolus 1,000 mL (1,000 mLs Intravenous Transfusing/Transfer 12/12/16 1639)     Initial Impression / Assessment and Plan / ED Course  I have reviewed the triage vital signs and the nursing notes.  Pertinent labs & imaging results that were available during my care of the patient were reviewed by me and considered in my medical decision making (see chart for details).      Final Clinical Impressions(s) / ED Diagnoses   Final diagnoses:  Diabetic ketoacidosis without coma associated with type 2 diabetes mellitus ([place])    Labs: I-STAT troponin, CBG, blood gas  Imaging:  Consults: [facility]  Therapeutics: Insulin regular, normal saline, promethazine, Zofran  Discharge Meds:   Assessment/Plan: 46 year old male presents today with DKA. Patient with uncontrolled type 2 diabetes. Patient noted to be hyperglycemic at 567, slightly, ketones in the urine, acidotic at 7.23 , CO2 12, anion gap of 24, AKI at 1.5 to from 0.99 on 4/18. Patient started on glucose stabilizing her fluids with hospital admission.      New  Prescriptions Current Discharge Medication List       Okey Regal, PA-C 12/12/16 1939    Francee Gentile 12/12/16 2043    Orlie Dakin, MD 12/13/16 [phone]

## 2016-12-12 NOTE — ED Notes (Signed)
IV team at bedside to obtain second IV site and attempt to obtain labs

## 2016-12-12 NOTE — ED Triage Notes (Signed)
Pt reports cbg >500 for past month, taking meds as prescribed. Reports generalized fatigue and thirst. No acute distress noted at triage.

## 2016-12-12 NOTE — ED Notes (Signed)
Blood sugar 500. RN and MD aware.

## 2016-12-12 NOTE — ED Notes (Signed)
Applied ice to pt's arm where IV infiltrated from cleocin. Slight amount of swelling. No redness noted. Pt tolerating well

## 2016-12-12 NOTE — ED Notes (Signed)
Pt blood sugar 474. MD and RN made aware

## 2016-12-12 NOTE — H&P (Signed)
History and Physical    Preston [name] ZOX:[id number] DOB: Oct 27, 1970 DOA: 12/12/2016   PCP: Preston Garter, MD /UNASSIGNED  Patient coming from/Resides with: Private residence  Admission status: Observation/sdu-medically necessary to stay a minimum 2 midnights to rule out impending and/or unexpected changes in physiologic status that may differ from initial evaluation performed in the ER and/or at time of admission. Presents with DKA-new diagnosis diabetes 2 started on metformin in April 2018. Suspect DKA likely precipitated by smoldering dental caries with possible gum infection as well as recent right axilla folliculitis that spontaneously drained. Patient will require insulin infusion until anion gap closed, monitoring of hemodynamic and telemetry status, strict I/O, and patient diabetes education. He also will likely require initiation of long-acting insulin in the hospitalization. His metformin will be held until his acute kidney injury has resolved but this can likely be resumed at time of discharge.  Chief Complaint: Hyperglycemia  HPI: Preston [name] is a 46 y.o. male with medical history significant for hypertension, left ventricular diastolic dysfunction, asthma with recurrent bronchitis and arthritis. He was recently diagnosed with diabetes mellitus 2 and April this year with a hemoglobin A1c of 11.7. He was started on metformin 1000 mg twice a day. He said he has had persistent elevation in CBGs greater than 500 for several weeks. He reports swelling in the back part of his lower jaw in relation to dental caries but no drainage. He had a "soft ball-sized" abscess in the right axilla which spontaneously drained "nasty stuff". In the ER patient's CBG was greater than 500 with an anion gap of 24 prompting fluid boluses and initiation of insulin infusion.  ED Course:  Vital Signs: BP 136/89   Pulse 97   Temp 97.7 F (36.5 C) (Oral)   Resp 14   SpO2 99%  Lab data: Sodium 131,  potassium 5.0, chloride 95, CO2 12, glucose 514 with anion gap 24, BUN 15, creatinine 1.52, calcium 10.4 poc troponin 0.00, white count 11,800 pharyngeal not obtained, hemoglobin 15.7, platelets 279,000, urinalysis unremarkable except for glycosuria and elevated specific gravity 1.026 Medications and treatments: Normal saline bolus 2 L, insulin infusion titrated based on CBG, Zofran 4 mg IV 1, Phenergan 25 mg IV 1  Review of Systems:  In addition to the HPI above,  No Fever-chills, myalgias or other constitutional symptoms No Headache, changes with Vision or hearing, new weakness, tingling, numbness in any extremity, dizziness, dysarthria or word finding difficulty, gait disturbance or imbalance, tremors or seizure activity No problems swallowing food or Liquids, choking or coughing while eating, abdominal pain with or after eating No Chest pain, Cough or Shortness of Breath, palpitations, orthopnea or DOE No Abdominal pain, N/V, melena,hematochezia, dark tarry stools, constipation No dysuria, malodorous urine, hematuria or flank pain No new skin rashes, masses or bruises, No new joint pains, aches, swelling or redness No recent unintentional weight gain or loss   Past Medical History:  Diagnosis Date  . Alcohol abuse   . Anxiety   . Arthritis    "hips, knees, back" (03/11/2015)  . Asthma    reports related to anxiety  . CHF (congestive heart failure) ([place])   . Chronic bronchitis ([place])   . Chronic hip pain    "both"  . Chronic lower back pain   . COPD (chronic obstructive pulmonary disease) ([place])   . Diabetes mellitus without complication ([place])   . Diverticulitis   . Diverticulitis   . DJD (degenerative joint disease)   . Dyspnea  with exertion   . GERD (gastroesophageal reflux disease)   . Gout   . History of Clostridium difficile infection   . Hypercholesterolemia   . Hypertension    Does not see a cardiologist  . Migraine    "new onset" (03/11/2015)  . Panic attacks    . Pneumonia   . Poor dentition     Past Surgical History:  Procedure Laterality Date  . APPENDECTOMY    . COLON SURGERY     14 inches of colon removed  . COLONOSCOPY WITH PROPOFOL N/A 04/01/2013   Procedure: COLONOSCOPY WITH PROPOFOL;  Surgeon: Arta Silence, MD;  Location: [facility] ENDOSCOPY;  Service: Endoscopy;  Laterality: N/A;  . FLEXIBLE SIGMOIDOSCOPY N/A 02/28/2013   Procedure: FLEXIBLE SIGMOIDOSCOPY;  Surgeon: Winfield Cunas., MD;  Location: [facility] ENDOSCOPY;  Service: Endoscopy;  Laterality: N/A;  . LAPAROSCOPIC LOW ANTERIOR RESECTION N/A 07/08/2013   Procedure: LAPAROSCOPIC LOW ANTERIOR RESECTION WITH TAKEDOWN OF SPLENIC FLEXURE.;  Surgeon: Adin Hector, MD;  Location: [place];  Service: General;  Laterality: N/A;  . MANDIBLE SURGERY Left 2010   "dry skin pocket cut out"  . TOTAL HIP ARTHROPLASTY Left 06/25/2016   Procedure: TOTAL HIP ARTHROPLASTY ANTERIOR APPROACH;  Surgeon: Frederik Pear, MD;  Location: [place];  Service: Orthopedics;  Laterality: Left;  . TOTAL HIP ARTHROPLASTY Right 09/10/2016   Procedure: TOTAL HIP ARTHROPLASTY ANTERIOR APPROACH;  Surgeon: Frederik Pear, MD;  Location: [place];  Service: Orthopedics;  Laterality: Right;    Social History   Social History  . Marital status: Single    Spouse name: N/A  . Number of children: N/A  . Years of education: N/A   Occupational History  . Not on file.   Social History Main Topics  . Smoking status: Former Smoker    Packs/day: 1.00    Years: 30.00    Types: Cigarettes    Quit date: 06/11/2016  . Smokeless tobacco: Former Systems developer    Types: Chew     Comment: "quit chewing in the 1990's"  . Alcohol use 3.6 oz/week    6 Cans of beer per week     Comment: 6 beers a day, as of 08/29/16 down to 2 beers  . Drug use: No  . Sexual activity: Yes   Other Topics Concern  . Not on file   Social History Narrative  . No narrative on file    Mobility: Independent Work history: ?? Disabled/unemployed   Allergies  Allergen  Reactions  . Adhesive [Tape] Itching and Rash    Please use "paper" tape.  . Morphine And Related Itching, Nausea And Vomiting and Other (See Comments)    "bad feeling"    Family History  Problem Relation Age of Onset  . Cancer Father        lymphoma  . Heart attack Mother      Prior to Admission medications   Medication Sig Start Date End Date Taking? Authorizing Provider  acetaminophen-codeine (TYLENOL #3) 300-30 MG tablet Take 1 tablet by mouth every 4 (four) hours as needed. 11/02/16   Preston Garter, MD  albuterol (PROVENTIL HFA;VENTOLIN HFA) 108 (90 Base) MCG/ACT inhaler Inhale 1-2 puffs into the lungs every 6 (six) hours as needed for wheezing or shortness of breath. Patient taking differently: Inhale 2 puffs into the lungs every 6 (six) hours as needed for wheezing or shortness of breath.  03/29/16   Preston Garter, MD  allopurinol (ZYLOPRIM) 300 MG tablet Take 1 tablet (300 mg  total) by mouth every evening. Patient taking differently: Take 300 mg by mouth at bedtime.  03/29/16   Preston Garter, MD  amLODipine (NORVASC) 5 MG tablet Take 1 tablet (5 mg total) by mouth daily. Patient taking differently: Take 5 mg by mouth at bedtime.  03/29/16   Preston Garter, MD  aspirin EC 325 MG tablet Take 1 tablet (325 mg total) by mouth 2 (two) times daily. Patient taking differently: Take 325 mg by mouth at bedtime.  06/25/16   Leighton Parody, PA-C  atorvastatin (LIPITOR) 40 MG tablet Take 1 tablet (40 mg total) by mouth every evening. Patient taking differently: Take 40 mg by mouth at bedtime.  03/29/16   Preston Garter, MD  Blood Glucose Monitoring Suppl (ACCU-CHEK AVIVA PLUS) w/Device KIT 1 each by Does not apply route 3 (three) times daily. 11/07/16   Preston Garter, MD  calcium carbonate (TUMS EX) 750 MG chewable tablet Chew 1 tablet by mouth 3 (three) times daily as needed for heartburn.    [provider]  colchicine 0.6 MG tablet Take 1  tablet (0.6 mg total) by mouth daily as needed. Patient taking differently: Take 0.6 mg by mouth daily as needed (gout).  03/29/16   Preston Garter, MD  diazepam (VALIUM) 5 MG tablet Take 1 tablet (5 mg total) by mouth daily as needed for anxiety. Patient taking differently: Take 5 mg by mouth at bedtime as needed for anxiety (panic attacks).  08/29/16   Preston Garter, MD  DULoxetine (CYMBALTA) 30 MG capsule Take 1 capsule (30 mg total) by mouth daily. Patient taking differently: Take 30 mg by mouth at bedtime.  03/29/16   Preston Garter, MD  famotidine (PEPCID) 20 MG tablet Take 1 tablet (20 mg total) by mouth daily. 03/29/16   Preston Garter, MD  furosemide (LASIX) 40 MG tablet Take 1 tablet (40 mg total) by mouth daily. 03/29/16   Preston Garter, MD  gabapentin (NEURONTIN) 300 MG capsule Take 1 capsule (300 mg total) by mouth 3 (three) times daily. Patient taking differently: Take 300 mg by mouth See admin instructions. Take 1 capsule (300 mg) by mouth every morning and 2 capsules (600 mg) at night 03/29/16   Jegede, Olugbemiga E, MD  glucose blood test strip Use as instructed 11/07/16   Preston Garter, MD  hydrOXYzine (ATARAX/VISTARIL) 25 MG tablet Take 1 tablet (25 mg total) by mouth 3 (three) times daily as needed for anxiety. Patient taking differently: Take 25 mg by mouth at bedtime as needed for anxiety (panic attacks).  03/29/16   Preston Garter, MD  ibuprofen (ADVIL,MOTRIN) 600 MG tablet Take 1 tablet (600 mg total) by mouth every 6 (six) hours as needed. 11/04/16   Julianne Rice, MD  Lancet Devices [facility]) lancets Use as instructed 11/07/16   Preston Garter, MD  lisinopril (PRINIVIL,ZESTRIL) 40 MG tablet Take 1 tablet (40 mg total) by mouth daily. Patient taking differently: Take 40 mg by mouth at bedtime.  03/29/16   Preston Garter, MD  metFORMIN (GLUCOPHAGE) 1000 MG tablet Take 1 tablet (1,000 mg total) by mouth 2 (two) times  daily with a meal. 11/07/16   Jegede, Marlena Clipper, MD  metoCLOPramide (REGLAN) 10 MG tablet Take 1 tablet (10 mg total) by mouth every 6 (six) hours as needed for nausea (headache). 11/04/16   Julianne Rice, MD  metoprolol (LOPRESSOR) 50 MG tablet Take 1 tablet (50 mg total) by mouth 2 (  two) times daily. 03/29/16   Preston Garter, MD  tiZANidine (ZANAFLEX) 2 MG tablet Take 1 tablet (2 mg total) by mouth every 6 (six) hours as needed for muscle spasms. 06/25/16   Leighton Parody, PA-C    Physical Exam: Vitals:   12/12/16 1330 12/12/16 1448 12/12/16 1500 12/12/16 1515  BP: 116/75 112/78 118/76 136/89  Pulse: 92 94 95 97  Resp: 18 20 (!) 23 14  Temp:      TempSrc:      SpO2: 99% 100% 97% 99%      Constitutional: NAD, calm, comfortable-Face is flushed Eyes: PERRL, lids and conjunctivae normal ENMT: Mucous membranes are dry. Posterior pharynx clear of any exudate or lesions. Extremely poor dentition-multiple teeth are missing, multiple caries, remaining teeth appear to have been broken off or worn down. Nonspecific swelling right posterior gumline but no drainage and no significant odors detected Neck: normal, supple, no masses, no thyromegaly Respiratory: clear to auscultation bilaterally, no wheezing, no crackles. Normal respiratory effort. No accessory muscle use.  Cardiovascular: Regular rate and rhythm, no murmurs / rubs / gallops. No extremity edema. 2+ pedal pulses. No carotid bruits.  Abdomen: no tenderness, no masses palpated. No hepatosplenomegaly. Bowel sounds positive.  Musculoskeletal: no clubbing / cyanosis. No joint deformity upper and lower extremities. Good ROM, no contractures. Normal muscle tone.  Skin: no rashes, ulcers. No induration-R axilla: several sm areas that are sl red in color with minimal induration/no drainage- no evidence previously described abscess Neurologic: CN 2-12 grossly intact. Sensation intact, DTR normal. Strength 5/5 x all 4 extremities.    Psychiatric: Normal judgment and insight. Alert and oriented x 3. Normal mood.    Labs on Admission: I have personally reviewed following labs and imaging studies  CBC:  Recent Labs Lab 12/12/16 1111  WBC 11.8*  HGB 15.7  HCT 45.8  MCV 87.9  PLT 149   Basic Metabolic Panel:  Recent Labs Lab 12/12/16 1111  NA 131*  K 5.0  CL 95*  CO2 12*  GLUCOSE 514*  BUN 15  CREATININE 1.52*  CALCIUM 10.4*   GFR: CrCl cannot be calculated (Unknown ideal weight.). Liver Function Tests: No results for input(s): AST, ALT, ALKPHOS, BILITOT, PROT, ALBUMIN in the last 168 hours. No results for input(s): LIPASE, AMYLASE in the last 168 hours. No results for input(s): AMMONIA in the last 168 hours. Coagulation Profile: No results for input(s): INR, PROTIME in the last 168 hours. Cardiac Enzymes: No results for input(s): CKTOTAL, CKMB, CKMBINDEX, TROPONINI in the last 168 hours. BNP (last 3 results) No results for input(s): PROBNP in the last 8760 hours. HbA1C: No results for input(s): HGBA1C in the last 72 hours. CBG:  Recent Labs Lab 12/12/16 1059 12/12/16 1129 12/12/16 1332 12/12/16 1443 12/12/16 1521  GLUCAP 567* 536* 500* 474* 477*   Lipid Profile: No results for input(s): CHOL, HDL, LDLCALC, TRIG, CHOLHDL, LDLDIRECT in the last 72 hours. Thyroid Function Tests: No results for input(s): TSH, T4TOTAL, FREET4, T3FREE, THYROIDAB in the last 72 hours. Anemia Panel: No results for input(s): VITAMINB12, FOLATE, FERRITIN, TIBC, IRON, RETICCTPCT in the last 72 hours. Urine analysis:    Component Value Date/Time   COLORURINE STRAW (A) 12/12/2016 1304   APPEARANCEUR CLEAR 12/12/2016 1304   LABSPEC 1.026 12/12/2016 1304   PHURINE 5.0 12/12/2016 1304   GLUCOSEU >=500 (A) 12/12/2016 1304   HGBUR NEGATIVE 12/12/2016 1304   BILIRUBINUR NEGATIVE 12/12/2016 1304   BILIRUBINUR small 11/07/2016 1551   KETONESUR 80 (A)  12/12/2016 1304   PROTEINUR NEGATIVE 12/12/2016 1304    UROBILINOGEN 0.2 11/07/2016 1551   UROBILINOGEN 0.2 06/29/2013 1047   NITRITE NEGATIVE 12/12/2016 1304   LEUKOCYTESUR NEGATIVE 12/12/2016 1304   Sepsis Labs: _0 (procalcitonin:4,lacticidven:4) )No results found for this or any previous visit (from the past 240 hour(s)).   Radiological Exams on Admission: No results found.   Assessment/Plan Principal Problem:   DKA, type 2  -Was progressively upper and 20 and an CBGs over 1 month CBGs go to than 500 for 2 weeks with elevated anion gap consistent with DKA -Newly diagnosed diabetes last month with hemoglobin A1c 11.7 -Hold metformin in setting of acute kidney injury -Continue insulin drip until anion gap closed -We'll initiate long-acting insulin this admission and resume metformin once acute kidney injury resolved -Continue hydration and IV fluids as outlined in DKA order set -Diabetes educator consultation -Beta hydroxybutyric acid, C-peptide -Zofran and Phenergan for nausea -Mild left upper quadrant tenderness appreciated so check lipase -Lipid panel in a.m.  Active Problems:   Acute kidney injury  -Renal function in April: 14 and 0.99 -Current renal function: 15 and 1.52 -Follow labs with hydration    Dental caries -Potential source of infection that could've contributed to DKA/uncontrolled diabetes -IV clindamycin -Oral pantograms    Left ventricular diastolic dysfunction/Hypertension -Echocardiogram October 2017: 30 LVEF with grade 1 diastolic dysfunction and no valvular abnormalities -Hold Norvasc, lisinopril and Lasix until euvolemic    Folliculitis of right axilla -Reports significant abscess formation right axilla that spontaneously drained one week ago with only residual small lesions at this juncture are nondraining -Clindamycin as above     HLD -Continue preadmission Lipitor    Asthma -Not actively wheezing    Arthritis    Obesity (BMI 30.0-34.9) -PCP to discuss weight reduction strategies     Peripheral neuropathy -Continue Cymbalta and Neurontin      DVT prophylaxis: Lovenox  Code Status: Full Family Communication: No family at bedside Disposition Plan: Home Consults called: None     ELLIS,ALLISON L. ANP-BC [facility] Pager [phone]   If 7PM-7AM, please contact night-coverage [email or web address] Password [email or web address]  12/12/2016, 3:24 PM

## 2016-12-12 NOTE — Telephone Encounter (Signed)
Patient's wife called the office to inform and speak with PCP or nurse about patient's blood sugar levels. Last night it was 570 and this morning it is 495. Family member was advised to take patient to ED because there's no availability(no appts) at this moment.   Thank you.

## 2016-12-13 DIAGNOSIS — M1990 Unspecified osteoarthritis, unspecified site: Secondary | ICD-10-CM | POA: Diagnosis present

## 2016-12-13 DIAGNOSIS — Z79899 Other long term (current) drug therapy: Secondary | ICD-10-CM | POA: Diagnosis not present

## 2016-12-13 DIAGNOSIS — F410 Panic disorder [episodic paroxysmal anxiety] without agoraphobia: Secondary | ICD-10-CM | POA: Diagnosis present

## 2016-12-13 DIAGNOSIS — F1011 Alcohol abuse, in remission: Secondary | ICD-10-CM | POA: Diagnosis present

## 2016-12-13 DIAGNOSIS — J449 Chronic obstructive pulmonary disease, unspecified: Secondary | ICD-10-CM | POA: Diagnosis present

## 2016-12-13 DIAGNOSIS — E669 Obesity, unspecified: Secondary | ICD-10-CM | POA: Diagnosis present

## 2016-12-13 DIAGNOSIS — I110 Hypertensive heart disease with heart failure: Secondary | ICD-10-CM | POA: Diagnosis present

## 2016-12-13 DIAGNOSIS — K029 Dental caries, unspecified: Secondary | ICD-10-CM | POA: Diagnosis present

## 2016-12-13 DIAGNOSIS — K219 Gastro-esophageal reflux disease without esophagitis: Secondary | ICD-10-CM | POA: Diagnosis present

## 2016-12-13 DIAGNOSIS — E876 Hypokalemia: Secondary | ICD-10-CM | POA: Diagnosis present

## 2016-12-13 DIAGNOSIS — J4520 Mild intermittent asthma, uncomplicated: Secondary | ICD-10-CM

## 2016-12-13 DIAGNOSIS — G43909 Migraine, unspecified, not intractable, without status migrainosus: Secondary | ICD-10-CM | POA: Diagnosis present

## 2016-12-13 DIAGNOSIS — Z87891 Personal history of nicotine dependence: Secondary | ICD-10-CM | POA: Diagnosis not present

## 2016-12-13 DIAGNOSIS — E1142 Type 2 diabetes mellitus with diabetic polyneuropathy: Secondary | ICD-10-CM | POA: Diagnosis present

## 2016-12-13 DIAGNOSIS — E1110 Type 2 diabetes mellitus with ketoacidosis without coma: Secondary | ICD-10-CM | POA: Diagnosis present

## 2016-12-13 DIAGNOSIS — K05219 Aggressive periodontitis, localized, unspecified severity: Secondary | ICD-10-CM | POA: Diagnosis present

## 2016-12-13 DIAGNOSIS — Z7984 Long term (current) use of oral hypoglycemic drugs: Secondary | ICD-10-CM | POA: Diagnosis not present

## 2016-12-13 DIAGNOSIS — N179 Acute kidney failure, unspecified: Secondary | ICD-10-CM | POA: Diagnosis present

## 2016-12-13 DIAGNOSIS — I5032 Chronic diastolic (congestive) heart failure: Secondary | ICD-10-CM | POA: Diagnosis present

## 2016-12-13 DIAGNOSIS — Z6831 Body mass index (BMI) 31.0-31.9, adult: Secondary | ICD-10-CM | POA: Diagnosis not present

## 2016-12-13 DIAGNOSIS — L02421 Furuncle of right axilla: Secondary | ICD-10-CM | POA: Diagnosis present

## 2016-12-13 DIAGNOSIS — L739 Follicular disorder, unspecified: Secondary | ICD-10-CM | POA: Diagnosis not present

## 2016-12-13 DIAGNOSIS — Z7982 Long term (current) use of aspirin: Secondary | ICD-10-CM | POA: Diagnosis not present

## 2016-12-13 DIAGNOSIS — Z96643 Presence of artificial hip joint, bilateral: Secondary | ICD-10-CM | POA: Diagnosis present

## 2016-12-13 DIAGNOSIS — E785 Hyperlipidemia, unspecified: Secondary | ICD-10-CM | POA: Diagnosis present

## 2016-12-13 LAB — BASIC METABOLIC PANEL
ANION GAP: 14 (ref 5–15)
ANION GAP: 12 (ref 5–15)
ANION GAP: 13 (ref 5–15)
BUN: 12 mg/dL (ref 6–20)
BUN: 10 mg/dL (ref 6–20)
BUN: 9 mg/dL (ref 6–20)
CALCIUM: 9.1 mg/dL (ref 8.9–10.3)
CALCIUM: 8.8 mg/dL — AB (ref 8.9–10.3)
CALCIUM: 8.8 mg/dL — AB (ref 8.9–10.3)
CO2: 16 mmol/L — ABNORMAL LOW (ref 22–32)
CO2: 20 mmol/L — ABNORMAL LOW (ref 22–32)
CO2: 21 mmol/L — ABNORMAL LOW (ref 22–32)
Chloride: 104 mmol/L (ref 101–111)
Chloride: 100 mmol/L — ABNORMAL LOW (ref 101–111)
Chloride: 99 mmol/L — ABNORMAL LOW (ref 101–111)
Creatinine, Ser: 1.02 mg/dL (ref 0.61–1.24)
Creatinine, Ser: 0.87 mg/dL (ref 0.61–1.24)
Creatinine, Ser: 0.85 mg/dL (ref 0.61–1.24)
GLUCOSE: 134 mg/dL — AB (ref 65–99)
Glucose, Bld: 221 mg/dL — ABNORMAL HIGH (ref 65–99)
Glucose, Bld: 205 mg/dL — ABNORMAL HIGH (ref 65–99)
POTASSIUM: 3.5 mmol/L (ref 3.5–5.1)
POTASSIUM: 2.9 mmol/L — AB (ref 3.5–5.1)
POTASSIUM: 3.3 mmol/L — AB (ref 3.5–5.1)
SODIUM: 134 mmol/L — AB (ref 135–145)
SODIUM: 132 mmol/L — AB (ref 135–145)
SODIUM: 133 mmol/L — AB (ref 135–145)

## 2016-12-13 LAB — LIPID PANEL
Cholesterol: 123 mg/dL (ref 0–200)
HDL: 22 mg/dL — AB (ref >40)
LDL CALC: UNDETERMINED mg/dL (ref 0–99)
TRIGLYCERIDES: 433 mg/dL — AB (ref <150)
Total CHOL/HDL Ratio: 5.6 RATIO
VLDL: UNDETERMINED mg/dL (ref 0–40)

## 2016-12-13 LAB — GLUCOSE, CAPILLARY
GLUCOSE-CAPILLARY: 179 mg/dL — AB (ref 65–99)
GLUCOSE-CAPILLARY: 148 mg/dL — AB (ref 65–99)
GLUCOSE-CAPILLARY: 143 mg/dL — AB (ref 65–99)
GLUCOSE-CAPILLARY: 146 mg/dL — AB (ref 65–99)
GLUCOSE-CAPILLARY: 276 mg/dL — AB (ref 65–99)
GLUCOSE-CAPILLARY: 349 mg/dL — AB (ref 65–99)
Glucose-Capillary: 123 mg/dL — ABNORMAL HIGH (ref 65–99)
Glucose-Capillary: 141 mg/dL — ABNORMAL HIGH (ref 65–99)
Glucose-Capillary: 143 mg/dL — ABNORMAL HIGH (ref 65–99)
Glucose-Capillary: 163 mg/dL — ABNORMAL HIGH (ref 65–99)
Glucose-Capillary: 175 mg/dL — ABNORMAL HIGH (ref 65–99)
Glucose-Capillary: 209 mg/dL — ABNORMAL HIGH (ref 65–99)
Glucose-Capillary: 210 mg/dL — ABNORMAL HIGH (ref 65–99)
Glucose-Capillary: 245 mg/dL — ABNORMAL HIGH (ref 65–99)

## 2016-12-13 LAB — MAGNESIUM: MAGNESIUM: 1.3 mg/dL — AB (ref 1.7–2.4)

## 2016-12-13 LAB — BETA-HYDROXYBUTYRIC ACID: BETA-HYDROXYBUTYRIC ACID: 3.06 mmol/L — AB (ref 0.05–0.27)

## 2016-12-13 MED ORDER — INSULIN GLARGINE 100 UNIT/ML ~~LOC~~ SOLN
25.0000 [IU] | Freq: Every day | SUBCUTANEOUS | Status: DC
  Filled 2016-12-13: qty 0.25

## 2016-12-13 MED ORDER — INSULIN ASPART 100 UNIT/ML ~~LOC~~ SOLN
4.0000 [IU] | Freq: Three times a day (TID) | SUBCUTANEOUS | Status: DC
  Administered 2016-12-13: 4 [IU] via SUBCUTANEOUS

## 2016-12-13 MED ORDER — DIAZEPAM 5 MG PO TABS
5.0000 mg | ORAL_TABLET | Freq: Two times a day (BID) | ORAL | Status: DC | PRN
  Administered 2016-12-13 – 2016-12-14 (×2): 5 mg via ORAL
  Filled 2016-12-13 (×2): qty 1

## 2016-12-13 MED ORDER — INSULIN ASPART 100 UNIT/ML ~~LOC~~ SOLN
0.0000 [IU] | Freq: Three times a day (TID) | SUBCUTANEOUS | Status: DC
  Administered 2016-12-13: 3 [IU] via SUBCUTANEOUS
  Administered 2016-12-13: 5 [IU] via SUBCUTANEOUS
  Administered 2016-12-14: 3 [IU] via SUBCUTANEOUS

## 2016-12-13 MED ORDER — INSULIN ASPART 100 UNIT/ML ~~LOC~~ SOLN
0.0000 [IU] | Freq: Every day | SUBCUTANEOUS | Status: DC
  Administered 2016-12-13: 4 [IU] via SUBCUTANEOUS

## 2016-12-13 MED ORDER — MAGNESIUM SULFATE 2 GM/50ML IV SOLN
2.0000 g | Freq: Once | INTRAVENOUS | Status: AC
  Administered 2016-12-13: 2 g via INTRAVENOUS
  Filled 2016-12-13: qty 50

## 2016-12-13 MED ORDER — INSULIN GLARGINE 100 UNIT/ML ~~LOC~~ SOLN
5.0000 [IU] | Freq: Once | SUBCUTANEOUS | Status: AC
  Administered 2016-12-13: 5 [IU] via SUBCUTANEOUS
  Filled 2016-12-13: qty 0.05

## 2016-12-13 MED ORDER — SODIUM CHLORIDE 0.9 % IV SOLN: INTRAVENOUS | Status: DC

## 2016-12-13 MED ORDER — POTASSIUM CHLORIDE 10 MEQ/100ML IV SOLN
10.0000 meq | INTRAVENOUS | Status: DC
  Administered 2016-12-13: 10 meq via INTRAVENOUS
  Filled 2016-12-13 (×2): qty 100

## 2016-12-13 MED ORDER — POTASSIUM CHLORIDE CRYS ER 20 MEQ PO TBCR
40.0000 meq | EXTENDED_RELEASE_TABLET | Freq: Four times a day (QID) | ORAL | Status: AC
  Administered 2016-12-13 (×2): 40 meq via ORAL
  Filled 2016-12-13 (×2): qty 2

## 2016-12-13 MED ORDER — LIVING WELL WITH DIABETES BOOK
Freq: Once | Status: DC
  Filled 2016-12-13 (×3): qty 1

## 2016-12-13 MED ORDER — POTASSIUM CHLORIDE 10 MEQ/100ML IV SOLN
INTRAVENOUS | Status: AC
  Administered 2016-12-13: 09:00:00
  Filled 2016-12-13: qty 100

## 2016-12-13 MED ORDER — POTASSIUM CHLORIDE 10 MEQ/50ML IV SOLN: 10.0000 meq | INTRAVENOUS | Status: DC

## 2016-12-13 MED ORDER — SODIUM CHLORIDE 0.9 % IV SOLN
INTRAVENOUS | Status: DC
  Administered 2016-12-13 – 2016-12-14 (×2): via INTRAVENOUS

## 2016-12-13 MED ORDER — INSULIN STARTER KIT- PEN NEEDLES (ENGLISH)
1.0000 | Freq: Once | Status: DC
  Filled 2016-12-13 (×2): qty 1

## 2016-12-13 MED ORDER — INSULIN GLARGINE 100 UNIT/ML ~~LOC~~ SOLN
20.0000 [IU] | Freq: Every day | SUBCUTANEOUS | Status: DC
  Administered 2016-12-13: 20 [IU] via SUBCUTANEOUS
  Filled 2016-12-13 (×2): qty 0.2

## 2016-12-13 NOTE — Progress Notes (Addendum)
PROGRESS NOTE                                                                                                                                                                                                             Patient Demographics:    Preston [name], is a 46 y.o. male, DOB - 1970-09-24, VOZ:[id number]  Admit date - 12/12/2016   Admitting Physician Waldemar Dickens, MD  Outpatient Primary MD for the patient is Tresa Garter, MD  LOS - 0  Chief Complaint  Patient presents with  . Hyperglycemia       Brief Narrative   Preston [name] is a 46 y.o. male with medical history significant for hypertension, left ventricular diastolic dysfunction, asthma with recurrent bronchitis and arthritis. He was recently diagnosed with diabetes mellitus 2 and April this year with a hemoglobin A1c of 11.7, DKA along with dental caries/abscess and a self drained right axillary abscess.    Subjective:    [employer] today has, No headache, No chest pain, No abdominal pain - No Nausea, No new weakness tingling or numbness, No Cough - SOB.     Assessment  & Plan :    Principal Problem:   DKA, type 2 ([name]) Active Problems:   Left ventricular diastolic dysfunction   Hypertension   Asthma   Acute kidney injury (HCC)   Arthritis   Obesity (BMI 30.0-34.9)   Folliculitis of right axilla   Dental caries   1.DKA in DM2 - recent A1c of 11.7, is on DKA protocol, gap about to close, has been placed on Lantus, will switch to sliding scale once gap has formally closed, he will receive diabetic and insulin education. Likely discharge in the morning.  2. Dental caries/abscess, self drained right axillary abscess. Patient has extremely poor dental hygiene, has loss of at least 8-10 front teeth, currently on clindamycin, Panorex evaluated and discussed with dental surgeon Dr. Mancel Parsons phone number [phone] on 12/13/2016, he wants the  patient to be discharged on oral and a mycin and he will see the patient in his office within the next week. Wife requested to set up the appointment today. PCP to monitor self drained right axillary abscess site.  3. Hypokalemia-hypomagnesemia. Both replaced.  4. ARF due to #1 above. Hydrated, improving and almost resolved.  5. Obesity. BMI around 31.  Outpatient follow-up with PCP for weight loss.  6. Dyslipidemia. On statin continue.  7. Asthma. No acute issues, supportive care as needed.  8. Diabetic peripheral neuropathy. Continue Neurontin and Cymbalta combination.    Diet : Diet heart healthy/carb modified Room service appropriate? Yes; Fluid consistency: Thin    Family Communication  :  Wife  Code Status :  Full  Disposition Plan  :  Home in am  Consults  :  DM educator, Dental Surgery - Dr Mancel Parsons MD [phone]   Procedures  :    DVT Prophylaxis  :  Lovenox   Lab Results  Component Value Date   PLT 279 12/12/2016    Inpatient Medications  Scheduled Meds: . allopurinol  300 mg Oral QHS  . DULoxetine  30 mg Oral QHS  . enoxaparin (LOVENOX) injection  40 mg Subcutaneous Q24H  . famotidine  20 mg Oral Daily  . gabapentin  300 mg Oral Daily  . gabapentin  600 mg Oral QHS  . [START ON 12/14/2016] insulin glargine  25 Units Subcutaneous Daily  . insulin glargine  5 Units Subcutaneous Once  . potassium chloride  40 mEq Oral Q6H   Continuous Infusions: . sodium chloride 75 mL/hr at 12/13/16 0847  . clindamycin (CLEOCIN) IV Stopped (12/13/16 0705)  . dextrose 5 % and 0.45% NaCl 50 mL/hr at 12/13/16 0847  . insulin (NOVOLIN-R) infusion 6 Units/hr (12/13/16 5053)  . potassium chloride     PRN Meds:.acetaminophen-codeine, albuterol, calcium carbonate, diazepam, ondansetron (ZOFRAN) IV **OR** promethazine, tiZANidine  Antibiotics  :    Anti-infectives    Start     Dose/Rate Route Frequency Ordered Stop   12/13/16 0600  clindamycin (CLEOCIN) IVPB 600 mg     600  mg 100 mL/hr over 30 Minutes Intravenous Every 8 hours 12/12/16 2012     12/12/16 1530  clindamycin (CLEOCIN) IVPB 600 mg  Status:  Discontinued     600 mg 100 mL/hr over 30 Minutes Intravenous Every 8 hours 12/12/16 1523 12/12/16 2012         Objective:   Vitals:   12/12/16 1928 12/12/16 2355 12/13/16 0413 12/13/16 0735  BP: 121/82 121/71 133/75 120/78  Pulse: 99 93 91 96  Resp: 18 13 15 12   Temp: 97.3 F (36.3 C) 98.2 F (36.8 C) 98.6 F (37 C) 97.3 F (36.3 C)  TempSrc: Oral Oral Oral Oral  SpO2: 96% 97% 95% 96%  Weight:      Height:        Wt Readings from Last 3 Encounters:  12/12/16 111.6 kg (246 lb)  11/07/16 124.7 kg (275 lb)  09/10/16 135.3 kg (298 lb 6 oz)     Intake/Output Summary (Last 24 hours) at 12/13/16 0850 Last data filed at 12/13/16 0600  Gross per 24 hour  Intake          2233.88 ml  Output                0 ml  Net          2233.88 ml     Physical Exam  Awake Alert, Oriented X 3, No new F.N deficits, Normal affect [place].AT,PERRAL Supple Neck,No JVD, No cervical lymphadenopathy appriciated.  Symmetrical Chest wall movement, Good air movement bilaterally, CTAB RRR,No Gallops,Rubs or new Murmurs, No Parasternal Heave +ve B.Sounds, Abd Soft, No tenderness, No organomegaly appriciated, No rebound - guarding or rigidity. No Cyanosis, Clubbing or edema, No new Rash or bruise    Right axillary  abscess site no fluctuance, small nodularity likely lymph node, dental hygiene extremely poor with multiple front teeth missing   Data Review:    CBC  Recent Labs Lab 12/12/16 1111  WBC 11.8*  HGB 15.7  HCT 45.8  PLT 279  MCV 87.9  MCH 30.1  MCHC 34.3  RDW 14.2    Chemistries   Recent Labs Lab 12/12/16 1111 12/12/16 1950 12/12/16 2313 12/13/16 0605  NA 131* 132* 134* 132*  K 5.0 4.5 3.5 2.9*  CL 95* 102 104 100*  CO2 12* 11* 16* 20*  GLUCOSE 514* 251* 221* 134*  BUN 15 16 12 10   CREATININE 1.52* 1.28* 1.02 0.87  CALCIUM 10.4* 9.4  9.1 8.8*  MG  --  1.5*  --  1.3*   ------------------------------------------------------------------------------------------------------------------  Recent Labs  12/13/16 0605  CHOL 123  HDL 22*  LDLCALC UNABLE TO CALCULATE IF TRIGLYCERIDE OVER 400 mg/dL  TRIG 433*  CHOLHDL 5.6    Lab Results  Component Value Date   HGBA1C 11.7 11/07/2016   ------------------------------------------------------------------------------------------------------------------ No results for input(s): TSH, T4TOTAL, T3FREE, THYROIDAB in the last 72 hours.  Invalid input(s): FREET3 ------------------------------------------------------------------------------------------------------------------ No results for input(s): VITAMINB12, FOLATE, FERRITIN, TIBC, IRON, RETICCTPCT in the last 72 hours.  Coagulation profile No results for input(s): INR, PROTIME in the last 168 hours.  No results for input(s): DDIMER in the last 72 hours.  Cardiac Enzymes No results for input(s): CKMB, TROPONINI, MYOGLOBIN in the last 168 hours.  Invalid input(s): CK ------------------------------------------------------------------------------------------------------------------    Component Value Date/Time   BNP 34.3 03/11/2015 1843    Micro Results Recent Results (from the past 240 hour(s))  MRSA PCR Screening     Status: Abnormal   Collection Time: 12/12/16  5:37 PM  Result Value Ref Range Status   MRSA by PCR (A) NEGATIVE Final    INVALID, UNABLE TO DETERMINE THE PRESENCE OF TARGET DNA DUE TO SPECIMEN INTEGRITY. RECOLLECTION REQUESTED.    Comment: CALLED TO T.DECARBEAUX AT 1950 BY L.PITT 12/12/16        The GeneXpert MRSA Assay (FDA approved for NASAL specimens only), is one component of a comprehensive MRSA colonization surveillance program. It is not intended to diagnose MRSA infection nor to guide or monitor treatment for MRSA infections.   MRSA culture     Status: None (Preliminary result)    Collection Time: 12/12/16  5:37 PM  Result Value Ref Range Status   Specimen Description NOSE  Final   Special Requests NONE  Final   Culture TOO YOUNG TO READ  Final   Report Status PENDING  Incomplete    Radiology Reports Dg Orthopantogram  Result Date: 12/12/2016 CLINICAL DATA:  Dental caries.  Infection. EXAM: ORTHOPANTOGRAM/PANORAMIC COMPARISON:  CT 01/05/2011. FINDINGS: Multifocal dental disease/carries. Multifocal periapical lucencies are noted along the right and left remaining teeth. Periapical lucencies are most prominent about the remaining left lower pre molars and right lower molars. Lucency is noted in the right inferior mandibular ramus. This could be related osteomyelitis. IMPRESSION: Severe dental disease with multifocal periapical lucencies as described above. Lucency is noted in the right inferior mandibular ramus. This could be related osteomyelitis. Electronically Signed   By: Marcello Moores  Register   On: 12/12/2016 17:26    Time Spent in minutes  30   Lala Lund M.D on 12/13/2016 at 8:50 AM  Between 7am to 7pm - Pager - [phone] ( page via [email or web address], text pages only, please mention full 10 digit call back number). After 7pm go to  [email or web address] - password [email or web address]

## 2016-12-13 NOTE — Care Management Note (Signed)
Case Management Note  Patient Details  Name: Preston [name] MRN: [id number] Date of Birth: March 19, 1971  Subjective/Objective:   Presents with DKA, hypokalemia, ARF,  Has hx of dyslipidemia, asthma, dm peripheral neuropathy. He has [employer].  PCP listed is Dr. Doreene Burke at the [facility] and [facility]                 Action/Plan: NCM will follow for dc needs.   Expected Discharge Date:                  Expected Discharge Plan:  Home/Self Care  In-House Referral:     Discharge planning Services  CM Consult  Post Acute Care Choice:    Choice offered to:     DME Arranged:    DME Agency:     HH Arranged:    HH Agency:     Status of Service:  In process, will continue to follow  If discussed at Long Length of Stay Meetings, dates discussed:    Additional Comments:  Zenon Mayo, RN 12/13/2016, 4:31 PM

## 2016-12-13 NOTE — Progress Notes (Signed)
  RD consulted for nutrition education regarding diabetes.   Lab Results  Component Value Date   HGBA1C 11.7 11/07/2016    RD provided "Carbohydrate Counting for People with Diabetes" handout from the Academy of Nutrition and Dietetics. Discussed different food groups and their effects on blood sugar, emphasizing carbohydrate-containing foods. Provided list of carbohydrates and recommended serving sizes of common foods.  Discussed importance of controlled and consistent carbohydrate intake throughout the day. Provided examples of ways to balance meals/snacks and encouraged intake of high-fiber, whole grain complex carbohydrates. Teach back method used.  Expect good compliance.  Body mass index is 31.58 kg/m. Pt meets criteria for obesity based on current BMI.  Patient also identified on the MST report for weight loss. He reports that he was trying to eat better and lose weight since his DM diagnosis in April.   Current diet order is CHO modified, patient is consuming 100% of meals at this time. Labs and medications reviewed. No further nutrition interventions warranted at this time. RD contact information provided. If additional nutrition issues arise, please re-consult RD.  Molli Barrows, RD, LDN, [place] Pager [phone] After Hours Pager [phone]

## 2016-12-13 NOTE — Progress Notes (Signed)
Inpatient Diabetes Program Recommendations  AACE/ADA: New Consensus Statement on Inpatient Glycemic Control (2015)  Target Ranges:  Prepandial:   less than 140 mg/dL      Peak postprandial:   less than 180 mg/dL (1-2 hours)      Critically ill patients:  140 - 180 mg/dL   Results for SHERVIN, CYPERT (MRN [id number]) as of 12/13/2016 10:16  Ref. Range 12/12/2016 11:11  Sodium Latest Ref Range: 135 - 145 mmol/L 131 (L)  Potassium Latest Ref Range: 3.5 - 5.1 mmol/L 5.0  Chloride Latest Ref Range: 101 - 111 mmol/L 95 (L)  CO2 Latest Ref Range: 22 - 32 mmol/L 12 (L)  Glucose Latest Ref Range: 65 - 99 mg/dL 514 (HH)  BUN Latest Ref Range: 6 - 20 mg/dL 15  Creatinine Latest Ref Range: 0.61 - 1.24 mg/dL 1.52 (H)  Calcium Latest Ref Range: 8.9 - 10.3 mg/dL 10.4 (H)  Anion gap Latest Ref Range: 5 - 15  24 (H)    Admit with: DKA/ Dental and Axillary Abscesses/ Recent Diagnosis of DM April 2018  History: DM   Home DM Meds: Metformin 1000 mg BID  Current Insulin Orders: IV Insulin drip      Transitioning to SQ Insulin today     Spoke with pt and wife about new diagnosis.  Discussed A1C results with them and explained what an A1C is, basic pathophysiology of DM Type 2, basic home care, basic diabetes diet nutrition principles, importance of checking CBGs and maintaining good CBG control to prevent long-term and short-term complications.  Reviewed signs and symptoms of hyperglycemia and hypoglycemia and how to treat hypoglycemia and hyperglycemia at home.  Also reviewed blood sugar goals and A1c goals for home.    RNs to provide ongoing basic DM education at bedside with this patient.  Have ordered educational booklet, insulin starter kit, and DM videos.  Have also placed RD consult for DM diet education for this patient.  Discussed with patient and wife Lantus and Novolog insulins.  Discussed how to use, when to use, how they both work, side effects, etc.  Reminded pt and wife to always make  sure they have the right insulin pen for the right time in the right amount.  Educated patient and spouse on insulin pen use at home.  Reviewed all steps of insulin pen including attachment of needle, 2-unit air shot, dialing up dose, giving injection, removing needle, disposal of sharps, storage of unused insulin, disposal of insulin etc.  Patient able to provide successful return demonstration.  Reviewed troubleshooting with insulin pen.  Also reviewed Signs/Symptoms of Hypoglycemia with patient and how to treat Hypoglycemia at home.  Have asked RNs caring for patient to please allow patient to give all injections here in hospital as much as possible for practice.  MD to give patient Rxs for insulin pens and insulin pen needles.       --Will follow patient during hospitalization--  Wyn Quaker RN, MSN, CDE Diabetes Coordinator Inpatient Glycemic Control Team Team Pager: [phone] (8a-5p)

## 2016-12-13 NOTE — Progress Notes (Signed)
MD- Patient has Medicaid and would prefer insulin pens at time of discharge.  Please make sure to give pt the following Rxs at time of discharge:  Lantus insulin pen- Order # [id number]  Novolog insulin pen- Order # [id number]  Insulin Pen needles- Order # [id number]     --Will follow patient during hospitalization--  Wyn Quaker RN, MSN, CDE Diabetes Coordinator Inpatient Glycemic Control Team Team Pager: [phone] (8a-5p)

## 2016-12-14 DIAGNOSIS — K029 Dental caries, unspecified: Secondary | ICD-10-CM

## 2016-12-14 DIAGNOSIS — E1110 Type 2 diabetes mellitus with ketoacidosis without coma: Secondary | ICD-10-CM

## 2016-12-14 LAB — BASIC METABOLIC PANEL
Anion gap: 12 (ref 5–15)
BUN: 5 mg/dL — AB (ref 6–20)
CO2: 20 mmol/L — ABNORMAL LOW (ref 22–32)
CREATININE: 0.85 mg/dL (ref 0.61–1.24)
Calcium: 9.1 mg/dL (ref 8.9–10.3)
Chloride: 101 mmol/L (ref 101–111)
GFR calc Af Amer: >60 mL/min (ref >60)
Glucose, Bld: 229 mg/dL — ABNORMAL HIGH (ref 65–99)
Potassium: 4 mmol/L (ref 3.5–5.1)
SODIUM: 133 mmol/L — AB (ref 135–145)

## 2016-12-14 LAB — CBC
HCT: 37.3 % — ABNORMAL LOW (ref 39.0–52.0)
Hemoglobin: 12.3 g/dL — ABNORMAL LOW (ref 13.0–17.0)
MCH: 28.7 pg (ref 26.0–34.0)
MCHC: 33 g/dL (ref 30.0–36.0)
MCV: 87.1 fL (ref 78.0–100.0)
PLATELETS: 161 10*3/uL (ref 150–400)
RBC: 4.28 MIL/uL (ref 4.22–5.81)
RDW: 14.1 % (ref 11.5–15.5)
WBC: 7.4 10*3/uL (ref 4.0–10.5)

## 2016-12-14 LAB — C-PEPTIDE: C PEPTIDE: 2.2 ng/mL (ref 1.1–4.4)

## 2016-12-14 LAB — HEMOGLOBIN A1C
Hgb A1c MFr Bld: 13.9 % — ABNORMAL HIGH (ref 4.8–5.6)
MEAN PLASMA GLUCOSE: 352 mg/dL

## 2016-12-14 LAB — GLUCOSE, CAPILLARY
Glucose-Capillary: 244 mg/dL — ABNORMAL HIGH (ref 65–99)
Glucose-Capillary: 283 mg/dL — ABNORMAL HIGH (ref 65–99)

## 2016-12-14 MED ORDER — MAGNESIUM SULFATE 2 GM/50ML IV SOLN
2.0000 g | Freq: Once | INTRAVENOUS | Status: AC
  Administered 2016-12-14: 2 g via INTRAVENOUS
  Filled 2016-12-14: qty 50

## 2016-12-14 MED ORDER — INSULIN ASPART 100 UNIT/ML ~~LOC~~ SOLN: SUBCUTANEOUS | 12 refills | Status: DC

## 2016-12-14 MED ORDER — INSULIN GLARGINE 100 UNIT/ML ~~LOC~~ SOLN
30.0000 [IU] | Freq: Every day | SUBCUTANEOUS | Status: DC
  Administered 2016-12-14: 30 [IU] via SUBCUTANEOUS
  Filled 2016-12-14: qty 0.3

## 2016-12-14 MED ORDER — INSULIN GLARGINE 100 UNIT/ML ~~LOC~~ SOLN
6.0000 [IU] | Freq: Once | SUBCUTANEOUS | Status: DC
  Filled 2016-12-14: qty 0.06

## 2016-12-14 MED ORDER — INSULIN GLARGINE 100 UNIT/ML ~~LOC~~ SOLN: 32.0000 [IU] | Freq: Every day | SUBCUTANEOUS | 0 refills | Status: DC

## 2016-12-14 MED ORDER — "INSULIN SYRINGE-NEEDLE U-100 25G X 1"" 1 ML MISC": 0 refills | Status: DC

## 2016-12-14 MED ORDER — CLINDAMYCIN HCL 300 MG PO CAPS: 600.0000 mg | ORAL_CAPSULE | Freq: Three times a day (TID) | ORAL | 0 refills | Status: AC

## 2016-12-14 MED ORDER — INSULIN GLARGINE 100 UNIT/ML ~~LOC~~ SOLN: 36.0000 [IU] | Freq: Every day | SUBCUTANEOUS | 0 refills | Status: DC

## 2016-12-14 NOTE — Discharge Summary (Signed)
Preston [name] VIF:[id number] DOB: [birth year] DOA: 12/12/2016  PCP: Tresa Garter, MD  Admit date: 12/12/2016  Discharge date: 12/14/2016  Admitted From: Home   Disposition:  Home   Recommendations for Outpatient Follow-up:   Follow up with PCP in 1-2 weeks  PCP Please obtain BMP/CBC, 2 view CXR in 1week,  (see Discharge instructions)   PCP Please follow up on the following pending results: Monitor CBGs, dental abscess and self drained right axillary abscess, check BMP and magnesium next visit   Home Health: None  Equipment/Devices: None  Consultations: Dental surgeon Dr. Darb over the phone Discharge Condition: Stable CODE STATUS: Full   Diet Recommendation: Diet heart healthy/carb modified   Chief Complaint  Patient presents with  . Hyperglycemia     Brief history of present illness from the day of admission and additional interim summary    Preston [name] a 46 y.o.malewith medical history significant for hypertension, left ventricular diastolic dysfunction, asthma with recurrent bronchitis and arthritis. He was recently diagnosed with diabetes mellitus 2 and April this year with a hemoglobin A1c of 11.7, DKA along with dental caries/abscess and a self drained right axillary abscess.                                                                 Hospital Course    1. DKA in DM2 - Was treated with DKA protocol, DKA is completely resolved, he received diabetic and insulin education, will be placed on Lantus along with sliding scale upon discharge, home dose Glucophage will be continued, request PCP to monitor his CBGs and A1c closely upon discharge.  Lab Results  Component Value Date   HGBA1C 13.9 (H) 12/13/2016    2. Dental caries/abscess, self drained right axillary abscess. Patient has  extremely poor dental hygiene, has loss of at least 8-10 front teeth, currently on clindamycin, Panorex evaluated and discussed with dental surgeon,  Dr. Mancel Parsons phone number [phone] on 12/13/2016, he wants the patient to be discharged on oral Clindamycin and he will see the patient in his office within the next week. Wife requested to set up the appointment yesterday itself on 12/13/2016. PCP to monitor self drained right axillary abscess site.  3. Hypokalemia-hypomagnesemia. Both replaced .  4. ARF due to #1 above. Resolved with IV fluids.  5. Obesity. BMI around 31. Outpatient follow-up with PCP for weight loss.  6. Dyslipidemia. On statin continue.  7. Asthma. No acute issues, supportive care as needed.  8. Diabetic peripheral neuropathy. Continue Neurontin and Cymbalta combination.      Discharge diagnosis     Principal Problem:   DKA, type 2 ([place]) Active Problems:   Left ventricular diastolic dysfunction   Hypertension   Asthma   Acute kidney injury ([place])   Arthritis   Obesity (  BMI 30.0-34.9)   Folliculitis of right axilla   Dental caries   Diabetic ketoacidosis without coma associated with type 2 diabetes mellitus ([place])   Primary dental caries, multisurface origin    Discharge instructions    Discharge Instructions    Discharge instructions    Complete by:  As directed    Follow with Primary MD Tresa Garter, MD in 7 days   Get CBC, CMP, 2 view Chest X ray checked  by Primary MD or SNF MD in 5-7 days ( we routinely change or add medications that can affect your baseline labs and fluid status, therefore we recommend that you get the mentioned basic workup next visit with your PCP, your PCP may decide not to get them or add new tests based on their clinical decision)  Activity: As tolerated with Full fall precautions use walker/cane & assistance as needed  Disposition Home    Diet:   Diet heart healthy/carb modified   Accuchecks 4 times/day,  Once in AM empty stomach and then before each meal. Log in all results and show them to your Prim.MD in 3 days. If any glucose reading is under 80 or above 300 call your Prim MD immidiately. Follow Low glucose instructions for glucose under 80 as instructed.   For Heart failure patients - Check your Weight same time everyday, if you gain over 2 pounds, or you develop in leg swelling, experience more shortness of breath or chest pain, call your Primary MD immediately. Follow Cardiac Low Salt Diet and 1.5 lit/day fluid restriction.  On your next visit with your primary care physician please Get Medicines reviewed and adjusted.  Please request your Prim.MD to go over all Hospital Tests and Procedure/Radiological results at the follow up, please get all Hospital records sent to your Prim MD by signing hospital release before you go home.  If you experience worsening of your admission symptoms, develop shortness of breath, life threatening emergency, suicidal or homicidal thoughts you must seek medical attention immediately by calling 911 or calling your MD immediately  if symptoms less severe.  You Must read complete instructions/literature along with all the possible adverse reactions/side effects for all the Medicines you take and that have been prescribed to you. Take any new Medicines after you have completely understood and accpet all the possible adverse reactions/side effects.   Do not drive, operate heavy machinery, perform activities at heights, swimming or participation in water activities or provide baby sitting services if your were admitted for syncope or siezures until you have seen by Primary MD or a Neurologist and advised to do so again.  Do not drive when taking Pain medications.    Do not take more than prescribed Pain, Sleep and Anxiety Medications  Special Instructions: If you have smoked or chewed Tobacco  in the last 2 yrs please stop smoking, stop any regular Alcohol  and  or any Recreational drug use.  Wear Seat belts while driving.   Please note  You were cared for by a hospitalist during your hospital stay. If you have any questions about your discharge medications or the care you received while you were in the hospital after you are discharged, you can call the unit and asked to speak with the hospitalist on call if the hospitalist that took care of you is not available. Once you are discharged, your primary care physician will handle any further medical issues. Please note that NO REFILLS for any discharge medications will be authorized once  you are discharged, as it is imperative that you return to your primary care physician (or establish a relationship with a primary care physician if you do not have one) for your aftercare needs so that they can reassess your need for medications and monitor your lab values.   Increase activity slowly    Complete by:  As directed       Discharge Medications   Allergies as of 12/14/2016      Reactions   Adhesive [tape] Itching, Rash   Please use "paper" tape.   Morphine And Related Itching, Nausea And Vomiting, Other (See Comments)   "bad feeling"      Medication List    TAKE these medications   ACCU-CHEK AVIVA PLUS w/Device Kit 1 each by Does not apply route 3 (three) times daily.   accu-chek softclix lancets Use as instructed   acetaminophen-codeine 300-30 MG tablet Commonly known as:  TYLENOL #3 Take 1 tablet by mouth every 4 (four) hours as needed.   albuterol 108 (90 Base) MCG/ACT inhaler Commonly known as:  PROVENTIL HFA;VENTOLIN HFA Inhale 1-2 puffs into the lungs every 6 (six) hours as needed for wheezing or shortness of breath. What changed:  how much to take   allopurinol 300 MG tablet Commonly known as:  ZYLOPRIM Take 1 tablet (300 mg total) by mouth every evening. What changed:  when to take this   amLODipine 5 MG tablet Commonly known as:  NORVASC Take 1 tablet (5 mg total) by mouth  daily. What changed:  when to take this   aspirin EC 325 MG tablet Take 1 tablet (325 mg total) by mouth 2 (two) times daily. What changed:  when to take this   atorvastatin 40 MG tablet Commonly known as:  LIPITOR Take 1 tablet (40 mg total) by mouth every evening. What changed:  when to take this   calcium carbonate 750 MG chewable tablet Commonly known as:  TUMS EX Chew 1 tablet by mouth 3 (three) times daily as needed for heartburn.   clindamycin 300 MG capsule Commonly known as:  CLEOCIN Take 2 capsules (600 mg total) by mouth 3 (three) times daily.   colchicine 0.6 MG tablet Take 1 tablet (0.6 mg total) by mouth daily as needed. What changed:  reasons to take this   diazepam 5 MG tablet Commonly known as:  VALIUM Take 1 tablet (5 mg total) by mouth daily as needed for anxiety. What changed:  when to take this  reasons to take this   DULoxetine 30 MG capsule Commonly known as:  CYMBALTA Take 1 capsule (30 mg total) by mouth daily. What changed:  when to take this   famotidine 20 MG tablet Commonly known as:  PEPCID Take 1 tablet (20 mg total) by mouth daily.   furosemide 40 MG tablet Commonly known as:  LASIX Take 1 tablet (40 mg total) by mouth daily.   gabapentin 300 MG capsule Commonly known as:  NEURONTIN Take 1 capsule (300 mg total) by mouth 3 (three) times daily. What changed:  when to take this  additional instructions   glucose blood test strip Use as instructed   hydrOXYzine 25 MG tablet Commonly known as:  ATARAX/VISTARIL Take 1 tablet (25 mg total) by mouth 3 (three) times daily as needed for anxiety. What changed:  when to take this  reasons to take this   ibuprofen 600 MG tablet Commonly known as:  ADVIL,MOTRIN Take 1 tablet (600 mg total) by mouth every 6 (  six) hours as needed. What changed:  reasons to take this   insulin aspart 100 UNIT/ML injection Commonly known as:  NOVOLOG Before each meal 3 times a day, 140-199 - 2  units, 200-250 - 4 units, 251-299 - 6 units,  300-349 - 8 units,  350 or above 10 units. Dispense syringes and needles as needed, Ok to switch to PEN if approved. Substitute to any brand approved. DX DM2, Code E11.65   insulin glargine 100 UNIT/ML injection Commonly known as:  LANTUS Inject 0.36 mLs (36 Units total) into the skin at bedtime. Dispense insulin pen if approved, if not dispense as needed syringes and needles for 1 month supply. Can switch to Levemir. Diagnosis E 11.65.   Insulin Syringe-Needle U-100 25G X 1" 1 ML Misc For 4 times a day insulin SQ, 1 month supply. Diagnosis E11.65   lisinopril 40 MG tablet Commonly known as:  PRINIVIL,ZESTRIL Take 1 tablet (40 mg total) by mouth daily. What changed:  when to take this   metFORMIN 1000 MG tablet Commonly known as:  GLUCOPHAGE Take 1 tablet (1,000 mg total) by mouth 2 (two) times daily with a meal.   metoCLOPramide 10 MG tablet Commonly known as:  REGLAN Take 1 tablet (10 mg total) by mouth every 6 (six) hours as needed for nausea (headache).   metoprolol tartrate 50 MG tablet Commonly known as:  LOPRESSOR Take 1 tablet (50 mg total) by mouth 2 (two) times daily.   tiZANidine 2 MG tablet Commonly known as:  ZANAFLEX Take 1 tablet (2 mg total) by mouth every 6 (six) hours as needed for muscle spasms.       Follow-up Information    Tresa Garter, MD. Schedule an appointment as soon as possible for a visit in 2 day(s).   Specialty:  Internal Medicine Contact information: [place] [address] [phone]        Michael Litter, DMD. Schedule an appointment as soon as possible for a visit in 4 week(s).   Specialty:  Dentistry Contact information: [address] [address] [phone]           Major procedures and Radiology Reports - PLEASE review detailed and final reports thoroughly  -         Dg Orthopantogram  Result Date: 12/12/2016 CLINICAL DATA:   Dental caries.  Infection. EXAM: ORTHOPANTOGRAM/PANORAMIC COMPARISON:  CT 01/05/2011. FINDINGS: Multifocal dental disease/carries. Multifocal periapical lucencies are noted along the right and left remaining teeth. Periapical lucencies are most prominent about the remaining left lower pre molars and right lower molars. Lucency is noted in the right inferior mandibular ramus. This could be related osteomyelitis. IMPRESSION: Severe dental disease with multifocal periapical lucencies as described above. Lucency is noted in the right inferior mandibular ramus. This could be related osteomyelitis. Electronically Signed   By: Marcello Moores  Register   On: 12/12/2016 17:26    Micro Results     Recent Results (from the past 240 hour(s))  MRSA PCR Screening     Status: Abnormal   Collection Time: 12/12/16  5:37 PM  Result Value Ref Range Status   MRSA by PCR (A) NEGATIVE Final    INVALID, UNABLE TO DETERMINE THE PRESENCE OF TARGET DNA DUE TO SPECIMEN INTEGRITY. RECOLLECTION REQUESTED.    Comment: CALLED TO T.DECARBEAUX AT 1950 BY L.PITT 12/12/16        The GeneXpert MRSA Assay (FDA approved for NASAL specimens only), is one component of a  comprehensive MRSA colonization surveillance program. It is not intended to diagnose MRSA infection nor to guide or monitor treatment for MRSA infections.   MRSA culture     Status: None (Preliminary result)   Collection Time: 12/12/16  5:37 PM  Result Value Ref Range Status   Specimen Description NOSE  Final   Special Requests NONE  Final   Culture TOO YOUNG TO READ  Final   Report Status PENDING  Incomplete    Today   Subjective    Preston [name] today has no headache, no chest abdominal pain, no new weakness tingling or numbness, feels much better wants to go home today.     Objective   Blood pressure (!) 142/95, pulse 95, temperature 98 F (36.7 C), temperature source Oral, resp. rate 13, height _0  (1.88 m), weight 111.6 kg (246 lb), SpO2 99  %.   Intake/Output Summary (Last 24 hours) at 12/14/16 1051 Last data filed at 12/14/16 0900  Gross per 24 hour  Intake             2020 ml  Output             3425 ml  Net            -1405 ml    Exam Awake Alert, Oriented x 3, No new F.N deficits, Normal affect, Extremely poor dental hygiene with multiple front teeth missing, [place].AT,PERRAL Supple Neck,No JVD, No cervical lymphadenopathy appriciated.  Symmetrical Chest wall movement, Good air movement bilaterally, CTAB RRR,No Gallops,Rubs or new Murmurs, No Parasternal Heave +ve B.Sounds, Abd Soft, Non tender, No organomegaly appriciated, No rebound -guarding or rigidity. No Cyanosis, Clubbing or edema, No new Rash or bruise, no fluctuance in the right axilla, palpable lymph node   Data Review   CBC w Diff:  Lab Results  Component Value Date   WBC 7.4 12/14/2016   HGB 12.3 (L) 12/14/2016   HCT 37.3 (L) 12/14/2016   PLT 161 12/14/2016   LYMPHOPCT 19 08/29/2016   MONOPCT 6 08/29/2016   EOSPCT 3 08/29/2016   BASOPCT 0 08/29/2016    CMP:  Lab Results  Component Value Date   NA 133 (L) 12/14/2016   K 4.0 12/14/2016   CL 101 12/14/2016   CO2 20 (L) 12/14/2016   BUN 5 (L) 12/14/2016   CREATININE 0.85 12/14/2016   CREATININE 0.77 04/26/2016   PROT 7.6 11/04/2016   ALBUMIN 4.3 11/04/2016   BILITOT 1.2 11/04/2016   ALKPHOS 92 11/04/2016   AST 19 11/04/2016   ALT 36 11/04/2016  .   Total Time in preparing paper work, data evaluation and todays exam - 56 minutes  Lala Lund M.D on 12/14/2016 at 10:51 AM  [facility]   Office  [phone]

## 2016-12-14 NOTE — Care Management Note (Signed)
Case Management Note  Patient Details  Name: ANTONIOS OSTROW MRN: [id number] Date of Birth: [birth year]  Subjective/Objective:   Presents with DKA, hypokalemia, ARF,  Has hx of dyslipidemia, asthma, dm peripheral neuropathy. He has [employer] .  Patient will need to call and make follow up apt.  PCP listed is Dr. Doreene Burke at [facility].                 Action/Plan: NCM will follow for dc needs.   Expected Discharge Date:  12/14/16               Expected Discharge Plan:  Home/Self Care  In-House Referral:     Discharge planning Services  CM Consult  Post Acute Care Choice:    Choice offered to:     DME Arranged:    DME Agency:     HH Arranged:    HH Agency:     Status of Service:  Completed, signed off  If discussed at [employer] of Stay Meetings, dates discussed:    Additional Comments:  Zenon Mayo, RN 12/14/2016, 1:01 PM

## 2016-12-14 NOTE — Discharge Instructions (Signed)
Follow with Primary MD Tresa Garter, MD in 7 days   Get CBC, CMP, 2 view Chest X ray checked  by Primary MD or SNF MD in 5-7 days ( we routinely change or add medications that can affect your baseline labs and fluid status, therefore we recommend that you get the mentioned basic workup next visit with your PCP, your PCP may decide not to get them or add new tests based on their clinical decision)  Activity: As tolerated with Full fall precautions use walker/cane & assistance as needed  Disposition Home    Diet:   Diet heart healthy/carb modified   Accuchecks 4 times/day, Once in AM empty stomach and then before each meal. Log in all results and show them to your Prim.MD in 3 days. If any glucose reading is under 80 or above 300 call your Prim MD immidiately. Follow Low glucose instructions for glucose under 80 as instructed.   For Heart failure patients - Check your Weight same time everyday, if you gain over 2 pounds, or you develop in leg swelling, experience more shortness of breath or chest pain, call your Primary MD immediately. Follow Cardiac Low Salt Diet and 1.5 lit/day fluid restriction.  On your next visit with your primary care physician please Get Medicines reviewed and adjusted.  Please request your Prim.MD to go over all Hospital Tests and Procedure/Radiological results at the follow up, please get all Hospital records sent to your Prim MD by signing hospital release before you go home.  If you experience worsening of your admission symptoms, develop shortness of breath, life threatening emergency, suicidal or homicidal thoughts you must seek medical attention immediately by calling 911 or calling your MD immediately  if symptoms less severe.  You Must read complete instructions/literature along with all the possible adverse reactions/side effects for all the Medicines you take and that have been prescribed to you. Take any new Medicines after you have completely  understood and accpet all the possible adverse reactions/side effects.   Do not drive, operate heavy machinery, perform activities at heights, swimming or participation in water activities or provide baby sitting services if your were admitted for syncope or siezures until you have seen by Primary MD or a Neurologist and advised to do so again.  Do not drive when taking Pain medications.    Do not take more than prescribed Pain, Sleep and Anxiety Medications  Special Instructions: If you have smoked or chewed Tobacco  in the last 2 yrs please stop smoking, stop any regular Alcohol  and or any Recreational drug use.  Wear Seat belts while driving.   Please note  You were cared for by a hospitalist during your hospital stay. If you have any questions about your discharge medications or the care you received while you were in the hospital after you are discharged, you can call the unit and asked to speak with the hospitalist on call if the hospitalist that took care of you is not available. Once you are discharged, your primary care physician will handle any further medical issues. Please note that NO REFILLS for any discharge medications will be authorized once you are discharged, as it is imperative that you return to your primary care physician (or establish a relationship with a primary care physician if you do not have one) for your aftercare needs so that they can reassess your need for medications and monitor your lab values.

## 2016-12-14 NOTE — Progress Notes (Signed)
Patient's discharge instructions gone over with patient.  His diabetic education was given to him as well. Prescriptions given to patient.  Patient stated that he understood his instructions.  IVs discontinued.  Patient is ready for discharge. 12/14/2016  1:17 PM  Tilda Burrow [place]

## 2016-12-15 LAB — MRSA CULTURE: CULTURE: NOT DETECTED

## 2016-12-19 ENCOUNTER — Encounter: Payer: Self-pay | Admitting: Internal Medicine

## 2016-12-19 ENCOUNTER — Ambulatory Visit: Payer: Medicaid Other | Attending: Internal Medicine | Admitting: Internal Medicine

## 2016-12-19 VITALS — BP 93/65 | HR 75 | Temp 98.3°F | Resp 18 | Ht 74.5 in | Wt 259.0 lb

## 2016-12-19 DIAGNOSIS — E119 Type 2 diabetes mellitus without complications: Secondary | ICD-10-CM

## 2016-12-19 DIAGNOSIS — I110 Hypertensive heart disease with heart failure: Secondary | ICD-10-CM | POA: Insufficient documentation

## 2016-12-19 DIAGNOSIS — K029 Dental caries, unspecified: Secondary | ICD-10-CM | POA: Insufficient documentation

## 2016-12-19 DIAGNOSIS — F17200 Nicotine dependence, unspecified, uncomplicated: Secondary | ICD-10-CM | POA: Diagnosis not present

## 2016-12-19 DIAGNOSIS — Z720 Tobacco use: Secondary | ICD-10-CM | POA: Diagnosis not present

## 2016-12-19 DIAGNOSIS — E1110 Type 2 diabetes mellitus with ketoacidosis without coma: Secondary | ICD-10-CM

## 2016-12-19 DIAGNOSIS — I509 Heart failure, unspecified: Secondary | ICD-10-CM | POA: Insufficient documentation

## 2016-12-19 DIAGNOSIS — Z794 Long term (current) use of insulin: Secondary | ICD-10-CM | POA: Insufficient documentation

## 2016-12-19 DIAGNOSIS — Z7982 Long term (current) use of aspirin: Secondary | ICD-10-CM | POA: Diagnosis not present

## 2016-12-19 DIAGNOSIS — E7800 Pure hypercholesterolemia, unspecified: Secondary | ICD-10-CM | POA: Diagnosis not present

## 2016-12-19 DIAGNOSIS — Z79899 Other long term (current) drug therapy: Secondary | ICD-10-CM | POA: Insufficient documentation

## 2016-12-19 DIAGNOSIS — K219 Gastro-esophageal reflux disease without esophagitis: Secondary | ICD-10-CM | POA: Insufficient documentation

## 2016-12-19 DIAGNOSIS — I10 Essential (primary) hypertension: Secondary | ICD-10-CM | POA: Diagnosis not present

## 2016-12-19 LAB — POCT URINALYSIS DIPSTICK
BILIRUBIN UA: NEGATIVE
Ketones, UA: NEGATIVE
LEUKOCYTES UA: NEGATIVE
NITRITE UA: NEGATIVE
Protein, UA: NEGATIVE
RBC UA: NEGATIVE
Spec Grav, UA: <=1.005 — AB (ref 1.010–1.025)
UROBILINOGEN UA: 0.2 U/dL
pH, UA: 5 (ref 5.0–8.0)

## 2016-12-19 LAB — GLUCOSE, POCT (MANUAL RESULT ENTRY): POC Glucose: 440 mg/dl — AB (ref 70–99)

## 2016-12-19 NOTE — Patient Instructions (Signed)
Diabetes Mellitus and Exercise Exercising regularly is important for your overall health, especially when you have diabetes (diabetes mellitus). Exercising is not only about losing weight. It has many health benefits, such as increasing muscle strength and bone density and reducing body fat and stress. This leads to improved fitness, flexibility, and endurance, all of which result in better overall health. Exercise has additional benefits for people with diabetes, including:  Reducing appetite.  Helping to lower and control blood glucose.  Lowering blood pressure.  Helping to control amounts of fatty substances (lipids) in the blood, such as cholesterol and triglycerides.  Helping the body to respond better to insulin (improving insulin sensitivity).  Reducing how much insulin the body needs.  Decreasing the risk for heart disease by:  Lowering cholesterol and triglyceride levels.  Increasing the levels of good cholesterol.  Lowering blood glucose levels. What is my activity plan? Your health care provider or certified diabetes educator can help you make a plan for the type and frequency of exercise (activity plan) that works for you. Make sure that you:  Do at least 150 minutes of moderate-intensity or vigorous-intensity exercise each week. This could be brisk walking, biking, or water aerobics.  Do stretching and strength exercises, such as yoga or weightlifting, at least 2 times a week.  Spread out your activity over at least 3 days of the week.  Get some form of physical activity every day.  Do not go more than 2 days in a row without some kind of physical activity.  Avoid being inactive for more than 90 minutes at a time. Take frequent breaks to walk or stretch.  Choose a type of exercise or activity that you enjoy, and set realistic goals.  Start slowly, and gradually increase the intensity of your exercise over time. What do I need to know about managing my  diabetes?  Check your blood glucose before and after exercising.  If your blood glucose is higher than 240 mg/dL (13.3 mmol/L) before you exercise, check your urine for ketones. If you have ketones in your urine, do not exercise until your blood glucose returns to normal.  Know the symptoms of low blood glucose (hypoglycemia) and how to treat it. Your risk for hypoglycemia increases during and after exercise. Common symptoms of hypoglycemia can include:  Hunger.  Anxiety.  Sweating and feeling clammy.  Confusion.  Dizziness or feeling light-headed.  Increased heart rate or palpitations.  Blurry vision.  Tingling or numbness around the mouth, lips, or tongue.  Tremors or shakes.  Irritability.  Keep a rapid-acting carbohydrate snack available before, during, and after exercise to help prevent or treat hypoglycemia.  Avoid injecting insulin into areas of the body that are going to be exercised. For example, avoid injecting insulin into:  The arms, when playing tennis.  The legs, when jogging.  Keep records of your exercise habits. Doing this can help you and your health care provider adjust your diabetes management plan as needed. Write down:  Food that you eat before and after you exercise.  Blood glucose levels before and after you exercise.  The type and amount of exercise you have done.  When your insulin is expected to peak, if you use insulin. Avoid exercising at times when your insulin is peaking.  When you start a new exercise or activity, work with your health care provider to make sure the activity is safe for you, and to adjust your insulin, medicines, or food intake as needed.  Drink plenty   of water while you exercise to prevent dehydration or heat stroke. Drink enough fluid to keep your urine clear or pale yellow. This information is not intended to replace advice given to you by your health care provider. Make sure you discuss any questions you have with  your health care provider. Document Released: 10/06/2003 Document Revised: 02/03/2016 Document Reviewed: 12/26/2015 Elsevier Interactive Patient Education  2017 [employer] Inc. Blood Glucose Monitoring, Adult Monitoring your blood sugar (glucose) helps you manage your diabetes. It also helps you and your health care provider determine how well your diabetes management plan is working. Blood glucose monitoring involves checking your blood glucose as often as directed, and keeping a record (log) of your results over time. Why should I monitor my blood glucose? Checking your blood glucose regularly can:  Help you understand how food, exercise, illnesses, and medicines affect your blood glucose.  Let you know what your blood glucose is at any time. You can quickly tell if you are having low blood glucose (hypoglycemia) or high blood glucose (hyperglycemia).  Help you and your health care provider adjust your medicines as needed. When should I check my blood glucose? Follow instructions from your health care provider about how often to check your blood glucose. This may depend on:  The type of diabetes you have.  How well-controlled your diabetes is.  Medicines you are taking. If you have type 1 diabetes:   Check your blood glucose at least 2 times a day.  Also check your blood glucose:  Before every insulin injection.  Before and after exercise.  Between meals.  2 hours after a meal.  Occasionally between 2:00 a.m. and 3:00 a.m., as directed.  Before potentially dangerous tasks, like driving or using heavy machinery.  At bedtime.  You may need to check your blood glucose more often, up to 6-10 times a day:  If you use an insulin pump.  If you need multiple daily injections (MDI).  If your diabetes is not well-controlled.  If you are ill.  If you have a history of severe hypoglycemia.  If you have a history of not knowing when your blood glucose is getting low  (hypoglycemia unawareness). If you have type 2 diabetes:   If you take insulin or other diabetes medicines, check your blood glucose at least 2 times a day.  If you are on intensive insulin therapy, check your blood glucose at least 4 times a day. Occasionally, you may also need to check between 2:00 a.m. and 3:00 a.m., as directed.  Also check your blood glucose:  Before and after exercise.  Before potentially dangerous tasks, like driving or using heavy machinery.  You may need to check your blood glucose more often if:  Your medicine is being adjusted.  Your diabetes is not well-controlled.  You are ill. What is a blood glucose log?  A blood glucose log is a record of your blood glucose readings. It helps you and your health care provider:  Look for patterns in your blood glucose over time.  Adjust your diabetes management plan as needed.  Every time you check your blood glucose, write down your result and notes about things that may be affecting your blood glucose, such as your diet and exercise for the day.  Most glucose meters store a record of glucose readings in the meter. Some meters allow you to download your records to a computer. How do I check my blood glucose? Follow these steps to get accurate readings of   your blood glucose: Supplies needed    Blood glucose meter.  Test strips for your meter. Each meter has its own strips. You must use the strips that come with your meter.  A needle to prick your finger (lancet). Do not use lancets more than once.  A device that holds the lancet (lancing device).  A journal or log book to write down your results. Procedure   Wash your hands with soap and water.  Prick the side of your finger (not the tip) with the lancet. Use a different finger each time.  Gently rub the finger until a small drop of blood appears.  Follow instructions that come with your meter for inserting the test strip, applying blood to the  strip, and using your blood glucose meter.  Write down your result and any notes. Alternative testing sites   Some meters allow you to use areas of your body other than your finger (alternative sites) to test your blood.  If you think you may have hypoglycemia, or if you have hypoglycemia unawareness, do not use alternative sites. Use your finger instead.  Alternative sites may not be as accurate as the fingers, because blood flow is slower in these areas. This means that the result you get may be delayed, and it may be different from the result that you would get from your finger.  The most common alternative sites are:  Forearm.  Thigh.  Palm of the hand. Additional tips   Always keep your supplies with you.  If you have questions or need help, all blood glucose meters have a 24-hour "hotline" number that you can call. You may also contact your health care provider.  After you use a few boxes of test strips, adjust (calibrate) your blood glucose meter by following instructions that came with your meter. This information is not intended to replace advice given to you by your health care provider. Make sure you discuss any questions you have with your health care provider. Document Released: 07/19/2003 Document Revised: 02/03/2016 Document Reviewed: 12/26/2015 Elsevier Interactive Patient Education  2017 Elsevier Inc.  

## 2016-12-19 NOTE — Progress Notes (Signed)
Patient is here for DM/HTN FU  Patient denies pain at this time.  Patient has eaten today. Patient took 36 units of Lantus and 6 units of Novolog this morning an hour ago.

## 2016-12-19 NOTE — Progress Notes (Signed)
Preston [name], is a 46 y.o. male  FAO:[id number]  ONG:[id number]  DOB - 11/17/1970  Chief Complaint  Patient presents with  . Hypertension       Subjective:   Preston [name] is a 46 y.o. male with medical history significant for hypertension, left ventricular diastolic dysfunction, asthma with recurrent bronchitis, chronic low back pain, arthritis S/P total left hip arthroplasty on 06/25/2016 and total right hip arthroplasty on 09/10/2016. He was recently diagnosed with diabetes mellitus 2 and April this year with a hemoglobin A1c of 11.7, was admitted recently for DKA along with dental caries/abscessand a self drained right axillary abscess. He is here today for a hospital discharge follow up visit. He is scheduled for dental surgery next week but needs his blood sugar controlled before then. He has no new complaint. He was started on insulin in the hospital, he had a some AKI and his metformin was held. Kidney function is now back to normal. HbA1C is up to 13.7 % from 11.7% recently. Patient has No headache, No chest pain, No abdominal pain - No Nausea, No new weakness tingling or numbness, No Cough - SOB.  Problem  Tooth Decayed  Morbid obesity due to excess calories (HCC)    ALLERGIES: Allergies  Allergen Reactions  . Adhesive [Tape] Itching and Rash    Please use "paper" tape.  . Morphine And Related Itching, Nausea And Vomiting and Other (See Comments)    "bad feeling"    PAST MEDICAL HISTORY: Past Medical History:  Diagnosis Date  . Alcohol abuse   . Anxiety   . Arthritis    "hips, knees, back" (03/11/2015)  . Asthma    reports related to anxiety  . CHF (congestive heart failure) ([place])   . Chronic bronchitis ([place])   . Chronic hip pain    "both"  . Chronic lower back pain   . COPD (chronic obstructive pulmonary disease) ([place])   . Diabetes mellitus without complication ([place])   . Diverticulitis   . Diverticulitis   . DJD (degenerative joint disease)   . Dyspnea      with exertion   . GERD (gastroesophageal reflux disease)   . Gout   . History of Clostridium difficile infection   . Hypercholesterolemia   . Hypertension    Does not see a cardiologist  . Migraine    "new onset" (03/11/2015)  . Panic attacks   . Pneumonia   . Poor dentition     MEDICATIONS AT HOME: Prior to Admission medications   Medication Sig Start Date End Date Taking? Authorizing Provider  acetaminophen-codeine (TYLENOL #3) 300-30 MG tablet Take 1 tablet by mouth every 4 (four) hours as needed. 11/02/16   Tresa Garter, MD  albuterol (PROVENTIL HFA;VENTOLIN HFA) 108 (90 Base) MCG/ACT inhaler Inhale 1-2 puffs into the lungs every 6 (six) hours as needed for wheezing or shortness of breath. Patient taking differently: Inhale 2 puffs into the lungs every 6 (six) hours as needed for wheezing or shortness of breath.  03/29/16   Tresa Garter, MD  allopurinol (ZYLOPRIM) 300 MG tablet Take 1 tablet (300 mg total) by mouth every evening. Patient taking differently: Take 300 mg by mouth at bedtime.  03/29/16   Tresa Garter, MD  amLODipine (NORVASC) 5 MG tablet Take 1 tablet (5 mg total) by mouth daily. Patient taking differently: Take 5 mg by mouth at bedtime.  03/29/16   Tresa Garter, MD  aspirin EC 325 MG tablet Take 1 tablet (  325 mg total) by mouth 2 (two) times daily. Patient taking differently: Take 325 mg by mouth at bedtime.  06/25/16   Leighton Parody, PA-C  atorvastatin (LIPITOR) 40 MG tablet Take 1 tablet (40 mg total) by mouth every evening. Patient taking differently: Take 40 mg by mouth at bedtime.  03/29/16   Tresa Garter, MD  Blood Glucose Monitoring Suppl (ACCU-CHEK AVIVA PLUS) w/Device KIT 1 each by Does not apply route 3 (three) times daily. 11/07/16   Tresa Garter, MD  calcium carbonate (TUMS EX) 750 MG chewable tablet Chew 1 tablet by mouth 3 (three) times daily as needed for heartburn.    [provider]  clindamycin  (CLEOCIN) 300 MG capsule Take 2 capsules (600 mg total) by mouth 3 (three) times daily. 12/14/16 12/24/16  Thurnell Lose, MD  colchicine 0.6 MG tablet Take 1 tablet (0.6 mg total) by mouth daily as needed. Patient taking differently: Take 0.6 mg by mouth daily as needed (gout).  03/29/16   Tresa Garter, MD  diazepam (VALIUM) 5 MG tablet Take 1 tablet (5 mg total) by mouth daily as needed for anxiety. Patient taking differently: Take 5 mg by mouth at bedtime as needed for anxiety (panic attacks).  08/29/16   Tresa Garter, MD  DULoxetine (CYMBALTA) 30 MG capsule Take 1 capsule (30 mg total) by mouth daily. Patient taking differently: Take 30 mg by mouth at bedtime.  03/29/16   Tresa Garter, MD  famotidine (PEPCID) 20 MG tablet Take 1 tablet (20 mg total) by mouth daily. 03/29/16   Tresa Garter, MD  furosemide (LASIX) 40 MG tablet Take 1 tablet (40 mg total) by mouth daily. 03/29/16   Tresa Garter, MD  gabapentin (NEURONTIN) 300 MG capsule Take 1 capsule (300 mg total) by mouth 3 (three) times daily. Patient taking differently: Take 300 mg by mouth See admin instructions. Take 1 capsule (300 mg) by mouth every morning and 2 capsules (600 mg) at night 03/29/16   Brettany Sydney E, MD  glucose blood test strip Use as instructed 11/07/16   Tresa Garter, MD  hydrOXYzine (ATARAX/VISTARIL) 25 MG tablet Take 1 tablet (25 mg total) by mouth 3 (three) times daily as needed for anxiety. Patient taking differently: Take 25 mg by mouth at bedtime as needed for anxiety (panic attacks).  03/29/16   Tresa Garter, MD  ibuprofen (ADVIL,MOTRIN) 600 MG tablet Take 1 tablet (600 mg total) by mouth every 6 (six) hours as needed. Patient taking differently: Take 600 mg by mouth every 6 (six) hours as needed for moderate pain.  11/04/16   Julianne Rice, MD  insulin aspart (NOVOLOG) 100 UNIT/ML injection Before each meal 3 times a day, 140-199 - 2 units, 200-250 - 4 units,  251-299 - 6 units,  300-349 - 8 units,  350 or above 10 units. Dispense syringes and needles as needed, Ok to switch to PEN if approved. Substitute to any brand approved. DX DM2, Code E11.65 12/14/16   Thurnell Lose, MD  insulin glargine (LANTUS) 100 UNIT/ML injection Inject 0.36 mLs (36 Units total) into the skin at bedtime. Dispense insulin pen if approved, if not dispense as needed syringes and needles for 1 month supply. Can switch to Levemir. Diagnosis E 11.65. 12/14/16   Thurnell Lose, MD  Insulin Syringe-Needle U-100 25G X 1" 1 ML MISC For 4 times a day insulin SQ, 1 month supply. Diagnosis E11.65 12/14/16   Thurnell Lose,  MD  Lancet Devices (ACCU-CHEK [facility]) lancets Use as instructed 11/07/16   Tresa Garter, MD  lisinopril (PRINIVIL,ZESTRIL) 40 MG tablet Take 1 tablet (40 mg total) by mouth daily. Patient taking differently: Take 40 mg by mouth at bedtime.  03/29/16   Tresa Garter, MD  metFORMIN (GLUCOPHAGE) 1000 MG tablet Take 1 tablet (1,000 mg total) by mouth 2 (two) times daily with a meal. 11/07/16   Tonji Elliff, Marlena Clipper, MD  metoCLOPramide (REGLAN) 10 MG tablet Take 1 tablet (10 mg total) by mouth every 6 (six) hours as needed for nausea (headache). 11/04/16   Julianne Rice, MD  metoprolol (LOPRESSOR) 50 MG tablet Take 1 tablet (50 mg total) by mouth 2 (two) times daily. 03/29/16   Tresa Garter, MD  tiZANidine (ZANAFLEX) 2 MG tablet Take 1 tablet (2 mg total) by mouth every 6 (six) hours as needed for muscle spasms. 06/25/16   Leighton Parody, PA-C    Objective:   Vitals:   12/19/16 0849  BP: 93/65  Pulse: 75  Resp: 18  Temp: 98.3 F (36.8 C)  TempSrc: Oral  SpO2: 98%  Weight: 259 lb (117.5 kg)  Height: 6' 2.5" (1.892 m)   Exam General appearance : Awake, alert, not in any distress. Speech Clear. Not toxic looking, obese, poor dentition HEENT: Atraumatic and Normocephalic, pupils equally reactive to light and accomodation Neck: Supple, no  JVD. No cervical lymphadenopathy.  Chest: Good air entry bilaterally, no added sounds  CVS: S1 S2 regular, no murmurs.  Abdomen: Bowel sounds present, Non tender and not distended with no gaurding, rigidity or rebound. Extremities: B/L Lower Ext shows no edema, both legs are warm to touch Neurology: Awake alert, and oriented X 3, CN II-XII intact, Non focal Skin: No Rash  Data Review Lab Results  Component Value Date   HGBA1C 13.9 (H) 12/13/2016   HGBA1C 11.7 11/07/2016   HGBA1C 5.6 03/12/2015    Assessment & Plan   1. Type 2 diabetes mellitus treated with insulin (HCC)  - Glucose (CBG) - Urinalysis Dipstick - Restart Metformin 1000 mg PO BID - Continue Lantus Insulin 36 units QHS - Continue Novolog sliding scale - Return in 2 weeks with BS logs for adjustment as needed   2. Tooth decayed  Patient has scheduled Dental Surgery for next week  3. Essential hypertension, benign  We have discussed target BP range and blood pressure goal. I have advised patient to check BP regularly and to call us back or report to clinic if the numbers are consistently higher than 140/90. We discussed the importance of compliance with medical therapy and DASH diet recommended, consequences of uncontrolled hypertension discussed.  - continue current BP medications  4. Tobacco use disorder  Edyn was counseled on the dangers of tobacco use, and was advised to quit. Reviewed strategies to maximize success, including removing cigarettes and smoking materials from environment, stress management and support of family/friends.   5. Morbid obesity due to excess calories (HCC)  Aim for 30 minutes of exercise most days. Rethink what you drink. Water is great! Aim for 2-3 Carb Choices per meal (30-45 grams) +/- 1 either way  Aim for 0-15 Carbs per snack if hungry  Include protein in moderation with your meals and snacks  Consider reading food labels for Total Carbohydrate and Fat Grams of foods    Consider checking BG at alternate times per day  Continue taking medication as directed Be mindful about how much sugar you are adding  to beverages and other foods. Fruit Punch - find one with no sugar  Measure and decrease portions of carbohydrate foods  Make your plate and don't go back for seconds  Patient have been counseled extensively about nutrition and exercise. Other issues discussed during this visit include: low cholesterol diet, weight control and daily exercise, foot care, annual eye examinations at Ophthalmology, importance of adherence with medications and regular follow-up. We also discussed long term complications of uncontrolled diabetes and hypertension.   Return in about 2 weeks (around 01/02/2017) for Medication Management.  The patient was given clear instructions to go to ER or return to medical center if symptoms don't improve, worsen or new problems develop. The patient verbalized understanding. The patient was told to call to get lab results if they haven't heard anything in the next week.   This note has been created with Surveyor, quantity. Any transcriptional errors are unintentional.    Angelica Chessman, MD, [place], Karilyn Cota, [place] and [place], [place]   12/19/2016, 9:25 AM

## 2016-12-28 ENCOUNTER — Encounter (HOSPITAL_BASED_OUTPATIENT_CLINIC_OR_DEPARTMENT_OTHER): Payer: Self-pay | Admitting: Anesthesiology

## 2016-12-29 NOTE — Addendum Note (Signed)
Addendum  created 12/29/16 0934 by Duane Boston, MD   Sign clinical note

## 2017-01-02 ENCOUNTER — Ambulatory Visit (HOSPITAL_BASED_OUTPATIENT_CLINIC_OR_DEPARTMENT_OTHER): Admission: RE | Admit: 2017-01-02 | Payer: Medicaid Other | Source: Ambulatory Visit | Admitting: Oral Surgery

## 2017-01-02 ENCOUNTER — Ambulatory Visit: Payer: Medicaid Other | Attending: Internal Medicine | Admitting: Internal Medicine

## 2017-01-02 ENCOUNTER — Encounter: Payer: Self-pay | Admitting: Internal Medicine

## 2017-01-02 ENCOUNTER — Encounter (HOSPITAL_BASED_OUTPATIENT_CLINIC_OR_DEPARTMENT_OTHER): Admission: RE | Payer: Self-pay | Source: Ambulatory Visit

## 2017-01-02 VITALS — BP 107/64 | HR 77 | Temp 98.7°F | Resp 18 | Ht 74.5 in | Wt 263.0 lb

## 2017-01-02 DIAGNOSIS — Z79899 Other long term (current) drug therapy: Secondary | ICD-10-CM | POA: Insufficient documentation

## 2017-01-02 DIAGNOSIS — M10072 Idiopathic gout, left ankle and foot: Secondary | ICD-10-CM | POA: Diagnosis not present

## 2017-01-02 DIAGNOSIS — E119 Type 2 diabetes mellitus without complications: Secondary | ICD-10-CM | POA: Insufficient documentation

## 2017-01-02 DIAGNOSIS — I10 Essential (primary) hypertension: Secondary | ICD-10-CM | POA: Diagnosis not present

## 2017-01-02 DIAGNOSIS — I5031 Acute diastolic (congestive) heart failure: Secondary | ICD-10-CM | POA: Insufficient documentation

## 2017-01-02 DIAGNOSIS — Z6833 Body mass index (BMI) 33.0-33.9, adult: Secondary | ICD-10-CM | POA: Diagnosis not present

## 2017-01-02 DIAGNOSIS — G894 Chronic pain syndrome: Secondary | ICD-10-CM

## 2017-01-02 DIAGNOSIS — Z794 Long term (current) use of insulin: Secondary | ICD-10-CM | POA: Insufficient documentation

## 2017-01-02 DIAGNOSIS — I110 Hypertensive heart disease with heart failure: Secondary | ICD-10-CM | POA: Insufficient documentation

## 2017-01-02 DIAGNOSIS — Z7982 Long term (current) use of aspirin: Secondary | ICD-10-CM | POA: Diagnosis not present

## 2017-01-02 DIAGNOSIS — F411 Generalized anxiety disorder: Secondary | ICD-10-CM | POA: Diagnosis not present

## 2017-01-02 DIAGNOSIS — Z5181 Encounter for therapeutic drug level monitoring: Secondary | ICD-10-CM | POA: Diagnosis present

## 2017-01-02 LAB — GLUCOSE, POCT (MANUAL RESULT ENTRY): POC Glucose: 120 mg/dl — AB (ref 70–99)

## 2017-01-02 SURGERY — EXTRACTION, TOOTH, MOLAR
Anesthesia: General

## 2017-01-02 MED ORDER — DULOXETINE HCL 30 MG PO CPEP: 30.0000 mg | ORAL_CAPSULE | Freq: Every day | ORAL | 3 refills | Status: DC

## 2017-01-02 MED ORDER — LISINOPRIL 40 MG PO TABS: 40.0000 mg | ORAL_TABLET | Freq: Every day | ORAL | 3 refills | Status: DC

## 2017-01-02 MED ORDER — ALLOPURINOL 300 MG PO TABS: 300.0000 mg | ORAL_TABLET | Freq: Every day | ORAL | 3 refills | Status: DC

## 2017-01-02 MED ORDER — AMLODIPINE BESYLATE 5 MG PO TABS: 5.0000 mg | ORAL_TABLET | Freq: Every day | ORAL | 3 refills | Status: DC

## 2017-01-02 MED ORDER — ATORVASTATIN CALCIUM 40 MG PO TABS: 40.0000 mg | ORAL_TABLET | Freq: Every evening | ORAL | 3 refills | Status: DC

## 2017-01-02 MED ORDER — FUROSEMIDE 40 MG PO TABS: 40.0000 mg | ORAL_TABLET | Freq: Every day | ORAL | 3 refills | Status: DC

## 2017-01-02 MED ORDER — METOPROLOL TARTRATE 50 MG PO TABS: 50.0000 mg | ORAL_TABLET | Freq: Two times a day (BID) | ORAL | 3 refills | Status: DC

## 2017-01-02 MED ORDER — INSULIN GLARGINE 100 UNIT/ML ~~LOC~~ SOLN: 36.0000 [IU] | Freq: Every day | SUBCUTANEOUS | 0 refills | Status: DC

## 2017-01-02 MED ORDER — GABAPENTIN 300 MG PO CAPS: 300.0000 mg | ORAL_CAPSULE | Freq: Three times a day (TID) | ORAL | 3 refills | Status: DC

## 2017-01-02 MED ORDER — METFORMIN HCL 1000 MG PO TABS: 1000.0000 mg | ORAL_TABLET | Freq: Two times a day (BID) | ORAL | 3 refills | Status: DC

## 2017-01-02 MED ORDER — HYDROXYZINE HCL 25 MG PO TABS: 25.0000 mg | ORAL_TABLET | Freq: Three times a day (TID) | ORAL | 3 refills | Status: DC | PRN

## 2017-01-02 MED ORDER — COLCHICINE 0.6 MG PO TABS: 0.6000 mg | ORAL_TABLET | Freq: Every day | ORAL | 3 refills | Status: DC | PRN

## 2017-01-02 MED ORDER — ACETAMINOPHEN-CODEINE #3 300-30 MG PO TABS: 1.0000 | ORAL_TABLET | ORAL | 0 refills | Status: DC | PRN

## 2017-01-02 NOTE — Progress Notes (Signed)
Preston [name], is a 46 y.o. male  EVO:[id number]  [id number]:[id number]  DOB - [birth year]  Chief Complaint  Patient presents with  . Medication Management       Subjective:   Preston [name] is a 46 y.o. male with medical history significant for hypertension, left ventricular diastolic dysfunction, asthma with recurrent bronchitis, chronic low back pain, arthritis S/P total left hip arthroplasty on 06/25/2016 and total right hip arthroplasty on 09/10/2016. here today for a follow up visit and medication refill. Patient has no complain today. He needs refill of his medications today. Patient has No headache, No chest pain, No abdominal pain - No Nausea, No new weakness tingling or numbness, No Cough - SOB.  Problem  Acute Diastolic Congestive Heart Failure (Hcc)  Type 2 Diabetes Mellitus Treated Without Insulin (Hcc)    ALLERGIES: Allergies  Allergen Reactions  . Adhesive [Tape] Itching and Rash    Please use "paper" tape.  . Morphine And Related Itching, Nausea And Vomiting and Other (See Comments)    "bad feeling"    PAST MEDICAL HISTORY: Past Medical History:  Diagnosis Date  . Alcohol abuse   . Anxiety   . Arthritis    "hips, knees, back" (03/11/2015)  . Asthma    reports related to anxiety  . CHF (congestive heart failure) ([place])   . Chronic bronchitis ([place])   . Chronic hip pain    "both"  . Chronic lower back pain   . COPD (chronic obstructive pulmonary disease) ([place])   . Diabetes mellitus without complication ([place])   . Diverticulitis   . Diverticulitis   . DJD (degenerative joint disease)   . Dyspnea    with exertion   . GERD (gastroesophageal reflux disease)   . Gout   . History of Clostridium difficile infection   . Hypercholesterolemia   . Hypertension    Does not see a cardiologist  . Migraine    "new onset" (03/11/2015)  . Panic attacks   . Pneumonia   . Poor dentition     MEDICATIONS AT HOME: Prior to Admission medications   Medication Sig Start Date  End Date Taking? Authorizing Provider  acetaminophen-codeine (TYLENOL #3) 300-30 MG tablet Take 1 tablet by mouth every 4 (four) hours as needed. 01/02/17   Tresa Garter, MD  albuterol (PROVENTIL HFA;VENTOLIN HFA) 108 (90 Base) MCG/ACT inhaler Inhale 1-2 puffs into the lungs every 6 (six) hours as needed for wheezing or shortness of breath. Patient taking differently: Inhale 2 puffs into the lungs every 6 (six) hours as needed for wheezing or shortness of breath.  03/29/16   Tresa Garter, MD  allopurinol (ZYLOPRIM) 300 MG tablet Take 1 tablet (300 mg total) by mouth at bedtime. 01/02/17   Tresa Garter, MD  amLODipine (NORVASC) 5 MG tablet Take 1 tablet (5 mg total) by mouth at bedtime. 01/02/17   Tresa Garter, MD  aspirin EC 325 MG tablet Take 1 tablet (325 mg total) by mouth 2 (two) times daily. Patient taking differently: Take 325 mg by mouth at bedtime.  06/25/16   Leighton Parody, PA-C  atorvastatin (LIPITOR) 40 MG tablet Take 1 tablet (40 mg total) by mouth every evening. 01/02/17   Tresa Garter, MD  Blood Glucose Monitoring Suppl (ACCU-CHEK AVIVA PLUS) w/Device KIT 1 each by Does not apply route 3 (three) times daily. 11/07/16   Tresa Garter, MD  calcium carbonate (TUMS EX) 750 MG chewable tablet Chew 1 tablet by mouth 3 (  three) times daily as needed for heartburn.    [provider]  colchicine 0.6 MG tablet Take 1 tablet (0.6 mg total) by mouth daily as needed. 01/02/17   Tresa Garter, MD  diazepam (VALIUM) 5 MG tablet Take 1 tablet (5 mg total) by mouth daily as needed for anxiety. Patient taking differently: Take 5 mg by mouth at bedtime as needed for anxiety (panic attacks).  08/29/16   Tresa Garter, MD  DULoxetine (CYMBALTA) 30 MG capsule Take 1 capsule (30 mg total) by mouth daily. 01/02/17   Tresa Garter, MD  famotidine (PEPCID) 20 MG tablet Take 1 tablet (20 mg total) by mouth daily. 03/29/16   Tresa Garter, MD    furosemide (LASIX) 40 MG tablet Take 1 tablet (40 mg total) by mouth daily. 01/02/17   Tresa Garter, MD  gabapentin (NEURONTIN) 300 MG capsule Take 1 capsule (300 mg total) by mouth 3 (three) times daily. 01/02/17   Angelica Chessman E, MD  glucose blood test strip Use as instructed 11/07/16   Tresa Garter, MD  hydrOXYzine (ATARAX/VISTARIL) 25 MG tablet Take 1 tablet (25 mg total) by mouth 3 (three) times daily as needed for anxiety. 01/02/17   Tresa Garter, MD  ibuprofen (ADVIL,MOTRIN) 600 MG tablet Take 1 tablet (600 mg total) by mouth every 6 (six) hours as needed. Patient taking differently: Take 600 mg by mouth every 6 (six) hours as needed for moderate pain.  11/04/16   Julianne Rice, MD  insulin aspart (NOVOLOG) 100 UNIT/ML injection Before each meal 3 times a day, 140-199 - 2 units, 200-250 - 4 units, 251-299 - 6 units,  300-349 - 8 units,  350 or above 10 units. Dispense syringes and needles as needed, Ok to switch to PEN if approved. Substitute to any brand approved. DX DM2, Code E11.65 12/14/16   Thurnell Lose, MD  insulin glargine (LANTUS) 100 UNIT/ML injection Inject 0.36 mLs (36 Units total) into the skin at bedtime. Dispense insulin pen if approved, if not dispense as needed syringes and needles for 1 month supply. Can switch to Levemir. Diagnosis E 11.65. 01/02/17   Tresa Garter, MD  Insulin Syringe-Needle U-100 25G X 1" 1 ML MISC For 4 times a day insulin SQ, 1 month supply. Diagnosis E11.65 12/14/16   Thurnell Lose, MD  Lancet Devices [facility]) lancets Use as instructed 11/07/16   Tresa Garter, MD  lisinopril (PRINIVIL,ZESTRIL) 40 MG tablet Take 1 tablet (40 mg total) by mouth daily. 01/02/17   Tresa Garter, MD  metFORMIN (GLUCOPHAGE) 1000 MG tablet Take 1 tablet (1,000 mg total) by mouth 2 (two) times daily with a meal. 01/02/17   Jegede, Marlena Clipper, MD  metoCLOPramide (REGLAN) 10 MG tablet Take 1 tablet (10 mg total) by mouth  every 6 (six) hours as needed for nausea (headache). 11/04/16   Julianne Rice, MD  metoprolol tartrate (LOPRESSOR) 50 MG tablet Take 1 tablet (50 mg total) by mouth 2 (two) times daily. 01/02/17   Tresa Garter, MD  tiZANidine (ZANAFLEX) 2 MG tablet Take 1 tablet (2 mg total) by mouth every 6 (six) hours as needed for muscle spasms. 06/25/16   Leighton Parody, PA-C    Objective:   Vitals:   01/02/17 1603  BP: 107/64  Pulse: 77  Resp: 18  Temp: 98.7 F (37.1 C)  TempSrc: Oral  SpO2: 98%  Weight: 263 lb (119.3 kg)  Height: 6' 2.5" (1.892 m)  Exam General appearance : Awake, alert, not in any distress. Speech Clear. Not toxic looking, obese HEENT: Atraumatic and Normocephalic, pupils equally reactive to light and accomodation Neck: Supple, no JVD. No cervical lymphadenopathy.  Chest: Good air entry bilaterally, no added sounds  CVS: S1 S2 regular, no murmurs.  Abdomen: Bowel sounds present, Non tender and not distended with no gaurding, rigidity or rebound. Extremities: B/L Lower Ext shows no edema, both legs are warm to touch Neurology: Awake alert, and oriented X 3, CN II-XII intact, Non focal Skin: No Rash  Data Review Lab Results  Component Value Date   HGBA1C 13.9 (H) 12/13/2016   HGBA1C 11.7 11/07/2016   HGBA1C 5.6 03/12/2015    Assessment & Plan   1. Type 2 diabetes mellitus treated without insulin (HCC)  - Glucose (CBG)  - insulin glargine (LANTUS) 100 UNIT/ML injection; Inject 0.36 mLs (36 Units total) into the skin at bedtime. Dispense insulin pen if approved, if not dispense as needed syringes and needles for 1 month supply. Can switch to Levemir. Diagnosis E 11.65.  Dispense: 10 mL; Refill: 0 - metFORMIN (GLUCOPHAGE) 1000 MG tablet; Take 1 tablet (1,000 mg total) by mouth 2 (two) times daily with a meal.  Dispense: 180 tablet; Refill: 3  2. Essential hypertension, benign  - amLODipine (NORVASC) 5 MG tablet; Take 1 tablet (5 mg total) by mouth at  bedtime.  Dispense: 90 tablet; Refill: 3 - lisinopril (PRINIVIL,ZESTRIL) 40 MG tablet; Take 1 tablet (40 mg total) by mouth daily.  Dispense: 90 tablet; Refill: 3 - metoprolol tartrate (LOPRESSOR) 50 MG tablet; Take 1 tablet (50 mg total) by mouth 2 (two) times daily.  Dispense: 180 tablet; Refill: 3  3. Chronic pain syndrome  - acetaminophen-codeine (TYLENOL #3) 300-30 MG tablet; Take 1 tablet by mouth every 4 (four) hours as needed.  Dispense: 60 tablet; Refill: 0 - DULoxetine (CYMBALTA) 30 MG capsule; Take 1 capsule (30 mg total) by mouth daily.  Dispense: 90 capsule; Refill: 3 - gabapentin (NEURONTIN) 300 MG capsule; Take 1 capsule (300 mg total) by mouth 3 (three) times daily.  Dispense: 270 capsule; Refill: 3  4. Idiopathic gout of left ankle, unspecified chronicity  - allopurinol (ZYLOPRIM) 300 MG tablet; Take 1 tablet (300 mg total) by mouth at bedtime.  Dispense: 90 tablet; Refill: 3 - colchicine 0.6 MG tablet; Take 1 tablet (0.6 mg total) by mouth daily as needed.  Dispense: 30 tablet; Refill: 3  5. Morbid obesity due to excess calories (HCC)  - atorvastatin (LIPITOR) 40 MG tablet; Take 1 tablet (40 mg total) by mouth every evening.  Dispense: 90 tablet; Refill: 3  6. Generalized anxiety disorder  - DULoxetine (CYMBALTA) 30 MG capsule; Take 1 capsule (30 mg total) by mouth daily.  Dispense: 90 capsule; Refill: 3 - hydrOXYzine (ATARAX/VISTARIL) 25 MG tablet; Take 1 tablet (25 mg total) by mouth 3 (three) times daily as needed for anxiety.  Dispense: 90 tablet; Refill: 3  7. Acute diastolic congestive heart failure (HCC)  - furosemide (LASIX) 40 MG tablet; Take 1 tablet (40 mg total) by mouth daily.  Dispense: 90 tablet; Refill: 3  Patient have been counseled extensively about nutrition and exercise. Other issues discussed during this visit include: low cholesterol diet, weight control and daily exercise, foot care, annual eye examinations at Ophthalmology, importance of adherence  with medications and regular follow-up. We also discussed long term complications of uncontrolled diabetes and hypertension.   Return in about 3 months (around 04/04/2017) for Hemoglobin  A1C and Follow up, DM.  The patient was given clear instructions to go to ER or return to medical center if symptoms don't improve, worsen or new problems develop. The patient verbalized understanding. The patient was told to call to get lab results if they haven't heard anything in the next week.   This note has been created with Surveyor, quantity. Any transcriptional errors are unintentional.    Angelica Chessman, MD, [place], Karilyn Cota, [place] [facility] [place], [place]   01/02/2017, 4:43 PM

## 2017-01-02 NOTE — Patient Instructions (Signed)
Diabetes Mellitus and Food It is important for you to manage your blood sugar (glucose) level. Your blood glucose level can be greatly affected by what you eat. Eating healthier foods in the appropriate amounts throughout the day at about the same time each day will help you control your blood glucose level. It can also help slow or prevent worsening of your diabetes mellitus. Healthy eating may even help you improve the level of your blood pressure and reach or maintain a healthy weight. General recommendations for healthful eating and cooking habits include:  Eating meals and snacks regularly. Avoid going long periods of time without eating to lose weight.  Eating a diet that consists mainly of plant-based foods, such as fruits, vegetables, nuts, legumes, and whole grains.  Using low-heat cooking methods, such as baking, instead of high-heat cooking methods, such as deep frying.  Work with your dietitian to make sure you understand how to use the Nutrition Facts information on food labels. How can food affect me? Carbohydrates Carbohydrates affect your blood glucose level more than any other type of food. Your dietitian will help you determine how many carbohydrates to eat at each meal and teach you how to count carbohydrates. Counting carbohydrates is important to keep your blood glucose at a healthy level, especially if you are using insulin or taking certain medicines for diabetes mellitus. Alcohol Alcohol can cause sudden decreases in blood glucose (hypoglycemia), especially if you use insulin or take certain medicines for diabetes mellitus. Hypoglycemia can be a life-threatening condition. Symptoms of hypoglycemia (sleepiness, dizziness, and disorientation) are similar to symptoms of having too much alcohol. If your health care provider has given you approval to drink alcohol, do so in moderation and use the following guidelines:  Women should not have more than one drink per day, and men  should not have more than two drinks per day. One drink is equal to: ? 12 oz of beer. ? 5 oz of wine. ? 1 oz of hard liquor.  Do not drink on an empty stomach.  Keep yourself hydrated. Have water, diet soda, or unsweetened iced tea.  Regular soda, juice, and other mixers might contain a lot of carbohydrates and should be counted.  What foods are not recommended? As you make food choices, it is important to remember that all foods are not the same. Some foods have fewer nutrients per serving than other foods, even though they might have the same number of calories or carbohydrates. It is difficult to get your body what it needs when you eat foods with fewer nutrients. Examples of foods that you should avoid that are high in calories and carbohydrates but low in nutrients include:  Trans fats (most processed foods list trans fats on the Nutrition Facts label).  Regular soda.  Juice.  Candy.  Sweets, such as cake, pie, doughnuts, and cookies.  Fried foods.  What foods can I eat? Eat nutrient-rich foods, which will nourish your body and keep you healthy. The food you should eat also will depend on several factors, including:  The calories you need.  The medicines you take.  Your weight.  Your blood glucose level.  Your blood pressure level.  Your cholesterol level.  You should eat a variety of foods, including:  Protein. ? Lean cuts of meat. ? Proteins low in saturated fats, such as fish, egg whites, and beans. Avoid processed meats.  Fruits and vegetables. ? Fruits and vegetables that may help control blood glucose levels, such as apples,   mangoes, and yams.  Dairy products. ? Choose fat-free or low-fat dairy products, such as milk, yogurt, and cheese.  Grains, bread, pasta, and rice. ? Choose whole grain products, such as multigrain bread, whole oats, and brown rice. These foods may help control blood pressure.  Fats. ? Foods containing healthful fats, such as  nuts, avocado, olive oil, canola oil, and fish.  Does everyone with diabetes mellitus have the same meal plan? Because every person with diabetes mellitus is different, there is not one meal plan that works for everyone. It is very important that you meet with a dietitian who will help you create a meal plan that is just right for you. This information is not intended to replace advice given to you by your health care provider. Make sure you discuss any questions you have with your health care provider. Document Released: 04/12/2005 Document Revised: 12/22/2015 Document Reviewed: 06/12/2013 Elsevier Interactive Patient Education  2017 Elsevier Inc. Blood Glucose Monitoring, Adult Monitoring your blood sugar (glucose) helps you manage your diabetes. It also helps you and your health care provider determine how well your diabetes management plan is working. Blood glucose monitoring involves checking your blood glucose as often as directed, and keeping a record (log) of your results over time. Why should I monitor my blood glucose? Checking your blood glucose regularly can:  Help you understand how food, exercise, illnesses, and medicines affect your blood glucose.  Let you know what your blood glucose is at any time. You can quickly tell if you are having low blood glucose (hypoglycemia) or high blood glucose (hyperglycemia).  Help you and your health care provider adjust your medicines as needed.  When should I check my blood glucose? Follow instructions from your health care provider about how often to check your blood glucose. This may depend on:  The type of diabetes you have.  How well-controlled your diabetes is.  Medicines you are taking.  If you have type 1 diabetes:  Check your blood glucose at least 2 times a day.  Also check your blood glucose: ? Before every insulin injection. ? Before and after exercise. ? Between meals. ? 2 hours after a meal. ? Occasionally between  2:00 a.m. and 3:00 a.m., as directed. ? Before potentially dangerous tasks, like driving or using heavy machinery. ? At bedtime.  You may need to check your blood glucose more often, up to 6-10 times a day: ? If you use an insulin pump. ? If you need multiple daily injections (MDI). ? If your diabetes is not well-controlled. ? If you are ill. ? If you have a history of severe hypoglycemia. ? If you have a history of not knowing when your blood glucose is getting low (hypoglycemia unawareness). If you have type 2 diabetes:  If you take insulin or other diabetes medicines, check your blood glucose at least 2 times a day.  If you are on intensive insulin therapy, check your blood glucose at least 4 times a day. Occasionally, you may also need to check between 2:00 a.m. and 3:00 a.m., as directed.  Also check your blood glucose: ? Before and after exercise. ? Before potentially dangerous tasks, like driving or using heavy machinery.  You may need to check your blood glucose more often if: ? Your medicine is being adjusted. ? Your diabetes is not well-controlled. ? You are ill. What is a blood glucose log?  A blood glucose log is a record of your blood glucose readings. It helps you   and your health care provider: ? Look for patterns in your blood glucose over time. ? Adjust your diabetes management plan as needed.  Every time you check your blood glucose, write down your result and notes about things that may be affecting your blood glucose, such as your diet and exercise for the day.  Most glucose meters store a record of glucose readings in the meter. Some meters allow you to download your records to a computer. How do I check my blood glucose? Follow these steps to get accurate readings of your blood glucose: Supplies needed   Blood glucose meter.  Test strips for your meter. Each meter has its own strips. You must use the strips that come with your meter.  A needle to prick  your finger (lancet). Do not use lancets more than once.  A device that holds the lancet (lancing device).  A journal or log book to write down your results. Procedure  Wash your hands with soap and water.  Prick the side of your finger (not the tip) with the lancet. Use a different finger each time.  Gently rub the finger until a small drop of blood appears.  Follow instructions that come with your meter for inserting the test strip, applying blood to the strip, and using your blood glucose meter.  Write down your result and any notes. Alternative testing sites  Some meters allow you to use areas of your body other than your finger (alternative sites) to test your blood.  If you think you may have hypoglycemia, or if you have hypoglycemia unawareness, do not use alternative sites. Use your finger instead.  Alternative sites may not be as accurate as the fingers, because blood flow is slower in these areas. This means that the result you get may be delayed, and it may be different from the result that you would get from your finger.  The most common alternative sites are: ? Forearm. ? Thigh. ? Palm of the hand. Additional tips  Always keep your supplies with you.  If you have questions or need help, all blood glucose meters have a 24-hour "hotline" number that you can call. You may also contact your health care provider.  After you use a few boxes of test strips, adjust (calibrate) your blood glucose meter by following instructions that came with your meter. This information is not intended to replace advice given to you by your health care provider. Make sure you discuss any questions you have with your health care provider. Document Released: 07/19/2003 Document Revised: 02/03/2016 Document Reviewed: 12/26/2015 Elsevier Interactive Patient Education  2017 Elsevier Inc.  

## 2017-01-07 ENCOUNTER — Telehealth: Payer: Self-pay | Admitting: Internal Medicine

## 2017-01-07 NOTE — Telephone Encounter (Signed)
PT mother called to speak with the PCP or the nurse, about the letter that the PCP supposed to be sent to the [facility], releasing or clearing the Pt for surgery and that his Blood pressure in under control, they need a letter from the pcp saying this and to be tax to [phone] Attn; Mateo Flow at the [facility], please follow up and call the PT when is done

## 2017-01-08 ENCOUNTER — Telehealth: Payer: Self-pay | Admitting: Internal Medicine

## 2017-01-08 DIAGNOSIS — E119 Type 2 diabetes mellitus without complications: Secondary | ICD-10-CM

## 2017-01-08 MED ORDER — INSULIN PEN NEEDLE 31G X 6 MM MISC: 0 refills | Status: DC

## 2017-01-08 MED ORDER — INSULIN GLARGINE 100 UNIT/ML SOLOSTAR PEN: 36.0000 [IU] | PEN_INJECTOR | Freq: Every day | SUBCUTANEOUS | 0 refills | Status: DC

## 2017-01-08 NOTE — Telephone Encounter (Signed)
PT called to request a refill for insulin glargine (LANTUS) 100 UNIT/ML injection  Need to be change to the percil pen they still did not pick it at [place], so they pharmacy is waiting for [place] to call and change the order, since the PT doesn't know how to used the other one,  please follow up

## 2017-01-08 NOTE — Telephone Encounter (Signed)
Solostar and pen needles sent to [facility].

## 2017-01-09 NOTE — Telephone Encounter (Signed)
Pt. Spouse called requesting to speak with nurse. She is requesting a letter stating that pt. BP is under control. Pt. Is suppose to have oral surgery done but needs clearance. Please f/u

## 2017-02-05 ENCOUNTER — Other Ambulatory Visit: Payer: Self-pay | Admitting: Pharmacist

## 2017-02-05 MED ORDER — GLUCOSE BLOOD VI STRP: ORAL_STRIP | 2 refills | Status: DC

## 2017-03-02 ENCOUNTER — Other Ambulatory Visit: Payer: Self-pay | Admitting: Internal Medicine

## 2017-04-15 ENCOUNTER — Encounter: Payer: Self-pay | Admitting: Internal Medicine

## 2017-04-15 ENCOUNTER — Ambulatory Visit: Payer: Medicaid Other | Attending: Internal Medicine | Admitting: Internal Medicine

## 2017-04-15 VITALS — BP 116/74 | HR 77 | Temp 98.7°F | Resp 18 | Ht 74.5 in | Wt 266.0 lb

## 2017-04-15 DIAGNOSIS — M25511 Pain in right shoulder: Secondary | ICD-10-CM | POA: Diagnosis not present

## 2017-04-15 DIAGNOSIS — F411 Generalized anxiety disorder: Secondary | ICD-10-CM | POA: Insufficient documentation

## 2017-04-15 DIAGNOSIS — I110 Hypertensive heart disease with heart failure: Secondary | ICD-10-CM | POA: Diagnosis not present

## 2017-04-15 DIAGNOSIS — K219 Gastro-esophageal reflux disease without esophagitis: Secondary | ICD-10-CM

## 2017-04-15 DIAGNOSIS — Z79899 Other long term (current) drug therapy: Secondary | ICD-10-CM | POA: Diagnosis not present

## 2017-04-15 DIAGNOSIS — G894 Chronic pain syndrome: Secondary | ICD-10-CM | POA: Diagnosis not present

## 2017-04-15 DIAGNOSIS — I10 Essential (primary) hypertension: Secondary | ICD-10-CM

## 2017-04-15 DIAGNOSIS — E119 Type 2 diabetes mellitus without complications: Secondary | ICD-10-CM | POA: Insufficient documentation

## 2017-04-15 DIAGNOSIS — Z7982 Long term (current) use of aspirin: Secondary | ICD-10-CM | POA: Diagnosis not present

## 2017-04-15 DIAGNOSIS — M10072 Idiopathic gout, left ankle and foot: Secondary | ICD-10-CM

## 2017-04-15 DIAGNOSIS — I5031 Acute diastolic (congestive) heart failure: Secondary | ICD-10-CM | POA: Insufficient documentation

## 2017-04-15 DIAGNOSIS — Z794 Long term (current) use of insulin: Secondary | ICD-10-CM | POA: Diagnosis not present

## 2017-04-15 DIAGNOSIS — Z6833 Body mass index (BMI) 33.0-33.9, adult: Secondary | ICD-10-CM | POA: Insufficient documentation

## 2017-04-15 LAB — POCT GLYCOSYLATED HEMOGLOBIN (HGB A1C): Hemoglobin A1C: 5.8

## 2017-04-15 LAB — GLUCOSE, POCT (MANUAL RESULT ENTRY): POC Glucose: 98 mg/dl (ref 70–99)

## 2017-04-15 MED ORDER — METFORMIN HCL 1000 MG PO TABS: 1000.0000 mg | ORAL_TABLET | Freq: Two times a day (BID) | ORAL | 3 refills | Status: DC

## 2017-04-15 MED ORDER — GABAPENTIN 300 MG PO CAPS: 300.0000 mg | ORAL_CAPSULE | Freq: Three times a day (TID) | ORAL | 3 refills | Status: DC

## 2017-04-15 MED ORDER — ATORVASTATIN CALCIUM 40 MG PO TABS: 40.0000 mg | ORAL_TABLET | Freq: Every evening | ORAL | 3 refills | Status: DC

## 2017-04-15 MED ORDER — ALLOPURINOL 300 MG PO TABS: 300.0000 mg | ORAL_TABLET | Freq: Every day | ORAL | 3 refills | Status: DC

## 2017-04-15 MED ORDER — METOPROLOL TARTRATE 50 MG PO TABS: 50.0000 mg | ORAL_TABLET | Freq: Two times a day (BID) | ORAL | 3 refills | Status: DC

## 2017-04-15 MED ORDER — HYDROXYZINE HCL 25 MG PO TABS: 25.0000 mg | ORAL_TABLET | Freq: Three times a day (TID) | ORAL | 3 refills | Status: DC | PRN

## 2017-04-15 MED ORDER — INSULIN GLARGINE 100 UNIT/ML SOLOSTAR PEN: 15.0000 [IU] | PEN_INJECTOR | Freq: Every day | SUBCUTANEOUS | 2 refills | Status: DC

## 2017-04-15 MED ORDER — FUROSEMIDE 40 MG PO TABS: 40.0000 mg | ORAL_TABLET | Freq: Every day | ORAL | 3 refills | Status: DC

## 2017-04-15 MED ORDER — COLCHICINE 0.6 MG PO TABS: 0.6000 mg | ORAL_TABLET | Freq: Every day | ORAL | 3 refills | Status: DC | PRN

## 2017-04-15 MED ORDER — DULOXETINE HCL 30 MG PO CPEP: 30.0000 mg | ORAL_CAPSULE | Freq: Every day | ORAL | 3 refills | Status: DC

## 2017-04-15 MED ORDER — AMLODIPINE BESYLATE 5 MG PO TABS: 5.0000 mg | ORAL_TABLET | Freq: Every day | ORAL | 3 refills | Status: DC

## 2017-04-15 MED ORDER — LISINOPRIL 40 MG PO TABS: 40.0000 mg | ORAL_TABLET | Freq: Every day | ORAL | 3 refills | Status: DC

## 2017-04-15 MED ORDER — FAMOTIDINE 20 MG PO TABS
20.0000 mg | ORAL_TABLET | Freq: Every day | ORAL | 3 refills | Status: DC
Start: 2017-04-15 — End: 2018-05-20

## 2017-04-15 NOTE — Patient Instructions (Signed)
Diabetes Mellitus and Exercise Exercising regularly is important for your overall health, especially when you have diabetes (diabetes mellitus). Exercising is not only about losing weight. It has many health benefits, such as increasing muscle strength and bone density and reducing body fat and stress. This leads to improved fitness, flexibility, and endurance, all of which result in better overall health. Exercise has additional benefits for people with diabetes, including:  Reducing appetite.  Helping to lower and control blood glucose.  Lowering blood pressure.  Helping to control amounts of fatty substances (lipids) in the blood, such as cholesterol and triglycerides.  Helping the body to respond better to insulin (improving insulin sensitivity).  Reducing how much insulin the body needs.  Decreasing the risk for heart disease by: ? Lowering cholesterol and triglyceride levels. ? Increasing the levels of good cholesterol. ? Lowering blood glucose levels.  What is my activity plan? Your health care provider or certified diabetes educator can help you make a plan for the type and frequency of exercise (activity plan) that works for you. Make sure that you:  Do at least 150 minutes of moderate-intensity or vigorous-intensity exercise each week. This could be brisk walking, biking, or water aerobics. ? Do stretching and strength exercises, such as yoga or weightlifting, at least 2 times a week. ? Spread out your activity over at least 3 days of the week.  Get some form of physical activity every day. ? Do not go more than 2 days in a row without some kind of physical activity. ? Avoid being inactive for more than 90 minutes at a time. Take frequent breaks to walk or stretch.  Choose a type of exercise or activity that you enjoy, and set realistic goals.  Start slowly, and gradually increase the intensity of your exercise over time.  What do I need to know about managing my  diabetes?  Check your blood glucose before and after exercising. ? If your blood glucose is higher than 240 mg/dL (13.3 mmol/L) before you exercise, check your urine for ketones. If you have ketones in your urine, do not exercise until your blood glucose returns to normal.  Know the symptoms of low blood glucose (hypoglycemia) and how to treat it. Your risk for hypoglycemia increases during and after exercise. Common symptoms of hypoglycemia can include: ? Hunger. ? Anxiety. ? Sweating and feeling clammy. ? Confusion. ? Dizziness or feeling light-headed. ? Increased heart rate or palpitations. ? Blurry vision. ? Tingling or numbness around the mouth, lips, or tongue. ? Tremors or shakes. ? Irritability.  Keep a rapid-acting carbohydrate snack available before, during, and after exercise to help prevent or treat hypoglycemia.  Avoid injecting insulin into areas of the body that are going to be exercised. For example, avoid injecting insulin into: ? The arms, when playing tennis. ? The legs, when jogging.  Keep records of your exercise habits. Doing this can help you and your health care provider adjust your diabetes management plan as needed. Write down: ? Food that you eat before and after you exercise. ? Blood glucose levels before and after you exercise. ? The type and amount of exercise you have done. ? When your insulin is expected to peak, if you use insulin. Avoid exercising at times when your insulin is peaking.  When you start a new exercise or activity, work with your health care provider to make sure the activity is safe for you, and to adjust your insulin, medicines, or food intake as needed.    Drink plenty of water while you exercise to prevent dehydration or heat stroke. Drink enough fluid to keep your urine clear or pale yellow. This information is not intended to replace advice given to you by your health care provider. Make sure you discuss any questions you have with  your health care provider. Document Released: 10/06/2003 Document Revised: 02/03/2016 Document Reviewed: 12/26/2015 Elsevier Interactive Patient Education  2018 [employer]. Carbohydrate Counting for Diabetes Mellitus, Adult Carbohydrate counting is a method for keeping track of how many carbohydrates you eat. Eating carbohydrates naturally increases the amount of sugar (glucose) in the blood. Counting how many carbohydrates you eat helps keep your blood glucose within normal limits, which helps you manage your diabetes (diabetes mellitus). It is important to know how many carbohydrates you can safely have in each meal. This is different for every person. A diet and nutrition specialist (registered dietitian) can help you make a meal plan and calculate how many carbohydrates you should have at each meal and snack. Carbohydrates are found in the following foods:  Grains, such as breads and cereals.  Dried beans and soy products.  Starchy vegetables, such as potatoes, peas, and corn.  Fruit and fruit juices.  Milk and yogurt.  Sweets and snack foods, such as cake, cookies, candy, chips, and soft drinks.  How do I count carbohydrates? There are two ways to count carbohydrates in food. You can use either of the methods or a combination of both. Reading "Nutrition Facts" on packaged food The "Nutrition Facts" list is included on the labels of almost all packaged foods and beverages in the [place] It includes:  The serving size.  Information about nutrients in each serving, including the grams (g) of carbohydrate per serving.  To use the "Nutrition Facts":  Decide how many servings you will have.  Multiply the number of servings by the number of carbohydrates per serving.  The resulting number is the total amount of carbohydrates that you will be having.  Learning standard serving sizes of other foods When you eat foods containing carbohydrates that are not packaged or do not include  "Nutrition Facts" on the label, you need to measure the servings in order to count the amount of carbohydrates:  Measure the foods that you will eat with a food scale or measuring cup, if needed.  Decide how many standard-size servings you will eat.  Multiply the number of servings by 15. Most carbohydrate-rich foods have about 15 g of carbohydrates per serving. ? For example, if you eat 8 oz (170 g) of strawberries, you will have eaten 2 servings and 30 g of carbohydrates (2 servings x 15 g = 30 g).  For foods that have more than one food mixed, such as soups and casseroles, you must count the carbohydrates in each food that is included.  The following list contains standard serving sizes of common carbohydrate-rich foods. Each of these servings has about 15 g of carbohydrates:   hamburger bun or  English muffin.   oz (15 mL) syrup.   oz (14 g) jelly.  1 slice of bread.  1 six-inch tortilla.  3 oz (85 g) cooked rice or pasta.  4 oz (113 g) cooked dried beans.  4 oz (113 g) starchy vegetable, such as peas, corn, or potatoes.  4 oz (113 g) hot cereal.  4 oz (113 g) mashed potatoes or  of a large baked potato.  4 oz (113 g) canned or frozen fruit.  4 oz (120 mL)  fruit juice.  4-6 crackers.  6 chicken nuggets.  6 oz (170 g) unsweetened dry cereal.  6 oz (170 g) plain fat-free yogurt or yogurt sweetened with artificial sweeteners.  8 oz (240 mL) milk.  8 oz (170 g) fresh fruit or one small piece of fruit.  24 oz (680 g) popped popcorn.  Example of carbohydrate counting Sample meal  3 oz (85 g) chicken breast.  6 oz (170 g) brown rice.  4 oz (113 g) corn.  8 oz (240 mL) milk.  8 oz (170 g) strawberries with sugar-free whipped topping. Carbohydrate calculation 1. Identify the foods that contain carbohydrates: ? Rice. ? Corn. ? Milk. ? Strawberries. 2. Calculate how many servings you have of each food: ? 2 servings rice. ? 1 serving corn. ? 1  serving milk. ? 1 serving strawberries. 3. Multiply each number of servings by 15 g: ? 2 servings rice x 15 g = 30 g. ? 1 serving corn x 15 g = 15 g. ? 1 serving milk x 15 g = 15 g. ? 1 serving strawberries x 15 g = 15 g. 4. Add together all of the amounts to find the total grams of carbohydrates eaten: ? 30 g + 15 g + 15 g + 15 g = 75 g of carbohydrates total. This information is not intended to replace advice given to you by your health care provider. Make sure you discuss any questions you have with your health care provider. Document Released: 07/16/2005 Document Revised: 02/03/2016 Document Reviewed: 12/28/2015 Elsevier Interactive Patient Education  [employer].

## 2017-04-15 NOTE — Progress Notes (Signed)
Preston [name], is a 47 y.o. male  NID:[id number]  RWE:[id number]  DOB - 1970/09/14  Chief Complaint  Patient presents with  . Diabetes       Subjective:   Preston [name] is a 46 y.o. male with medical history significant for hypertension, left ventricular diastolic dysfunction, asthma with recurrent bronchitis, chronic low back pain, arthritis S/P total left hip arthroplasty on 06/25/2016 and total right hip arthroplasty on 09/10/2016. He is here today for a routine diabetes follow up visit and referral to dentist for dental procedure. The patient has had appointments canceled with the dentist in the recent past because of "uncontrolled diabetes". Today patient is here to follow up with his diabetes and to see if it is controlled enough to allow for his dental procedure. Patient has no complaint today. He has been adherent with all his medications, left start modification and exercise. Patient has No headache, No chest pain, No abdominal pain - No Nausea, No new weakness tingling or numbness, No Cough - SOB.  Problem  Acute Pain of Right Shoulder    ALLERGIES: Allergies  Allergen Reactions  . Adhesive [Tape] Itching and Rash    Please use "paper" tape.  . Morphine And Related Itching, Nausea And Vomiting and Other (See Comments)    "bad feeling"    PAST MEDICAL HISTORY: Past Medical History:  Diagnosis Date  . Alcohol abuse   . Anxiety   . Arthritis    "hips, knees, back" (03/11/2015)  . Asthma    reports related to anxiety  . CHF (congestive heart failure) ([place])   . Chronic bronchitis ([place])   . Chronic hip pain    "both"  . Chronic lower back pain   . COPD (chronic obstructive pulmonary disease) ([place])   . Diabetes mellitus without complication ([place])   . Diverticulitis   . Diverticulitis   . DJD (degenerative joint disease)   . Dyspnea    with exertion   . GERD (gastroesophageal reflux disease)   . Gout   . History of Clostridium difficile infection   .  Hypercholesterolemia   . Hypertension    Does not see a cardiologist  . Migraine    "new onset" (03/11/2015)  . Panic attacks   . Pneumonia   . Poor dentition     MEDICATIONS AT HOME: Prior to Admission medications   Medication Sig Start Date End Date Taking? Authorizing Provider  acetaminophen-codeine (TYLENOL #3) 300-30 MG tablet Take 1 tablet by mouth every 4 (four) hours as needed. 01/02/17  Yes Tresa Garter, MD  albuterol (PROVENTIL HFA;VENTOLIN HFA) 108 (90 Base) MCG/ACT inhaler Inhale 1-2 puffs into the lungs every 6 (six) hours as needed for wheezing or shortness of breath. Patient taking differently: Inhale 2 puffs into the lungs every 6 (six) hours as needed for wheezing or shortness of breath.  03/29/16  Yes Tresa Garter, MD  allopurinol (ZYLOPRIM) 300 MG tablet Take 1 tablet (300 mg total) by mouth at bedtime. 04/15/17  Yes Njeri Vicente E, MD  amLODipine (NORVASC) 5 MG tablet Take 1 tablet (5 mg total) by mouth at bedtime. 04/15/17  Yes Tresa Garter, MD  aspirin EC 325 MG tablet Take 1 tablet (325 mg total) by mouth 2 (two) times daily. Patient taking differently: Take 325 mg by mouth at bedtime.  06/25/16  Yes Joanell Rising K, PA-C  atorvastatin (LIPITOR) 40 MG tablet Take 1 tablet (40 mg total) by mouth every evening. 04/15/17  Yes Roslynn Holte E,  MD  Blood Glucose Monitoring Suppl (ACCU-CHEK AVIVA PLUS) w/Device KIT 1 each by Does not apply route 3 (three) times daily. 11/07/16  Yes Tresa Garter, MD  calcium carbonate (TUMS EX) 750 MG chewable tablet Chew 1 tablet by mouth 3 (three) times daily as needed for heartburn.   Yes [provider]  colchicine 0.6 MG tablet Take 1 tablet (0.6 mg total) by mouth daily as needed. 04/15/17  Yes Tresa Garter, MD  DULoxetine (CYMBALTA) 30 MG capsule Take 1 capsule (30 mg total) by mouth daily. 04/15/17  Yes Tresa Garter, MD  famotidine (PEPCID) 20 MG tablet Take 1 tablet (20 mg  total) by mouth daily. 04/15/17  Yes Tresa Garter, MD  furosemide (LASIX) 40 MG tablet Take 1 tablet (40 mg total) by mouth daily. 04/15/17  Yes Tresa Garter, MD  gabapentin (NEURONTIN) 300 MG capsule Take 1 capsule (300 mg total) by mouth 3 (three) times daily. 04/15/17  Yes Kolter Reaver E, MD  glucose blood (ACCU-CHEK AVIVA PLUS) test strip Use as instructed for 3 times daily testing of blood sugar. E11.9 02/05/17  Yes Tresa Garter, MD  hydrOXYzine (ATARAX/VISTARIL) 25 MG tablet Take 1 tablet (25 mg total) by mouth 3 (three) times daily as needed for anxiety. 04/15/17  Yes Tresa Garter, MD  ibuprofen (ADVIL,MOTRIN) 600 MG tablet Take 1 tablet (600 mg total) by mouth every 6 (six) hours as needed. Patient taking differently: Take 600 mg by mouth every 6 (six) hours as needed for moderate pain.  11/04/16  Yes Julianne Rice, MD  Insulin Glargine (LANTUS SOLOSTAR) 100 UNIT/ML Solostar Pen Inject 15 Units into the skin daily at 10 pm. 04/15/17  Yes Sabre Romberger E, MD  Insulin Pen Needle (ULTICARE MINI PEN NEEDLES) 31G X 6 MM MISC Use as directed for once daily insulin dosing. E11.9 01/08/17  Yes Sammy Douthitt E, MD  Insulin Syringe-Needle U-100 25G X 1" 1 ML MISC For 4 times a day insulin SQ, 1 month supply. Diagnosis E11.65 12/14/16  Yes Thurnell Lose, MD  Lancet Devices [facility]) lancets Use as instructed 11/07/16  Yes Tresa Garter, MD  lisinopril (PRINIVIL,ZESTRIL) 40 MG tablet Take 1 tablet (40 mg total) by mouth daily. 04/15/17  Yes Tresa Garter, MD  metFORMIN (GLUCOPHAGE) 1000 MG tablet Take 1 tablet (1,000 mg total) by mouth 2 (two) times daily with a meal. 04/15/17  Yes Tiffancy Moger E, MD  metoprolol tartrate (LOPRESSOR) 50 MG tablet Take 1 tablet (50 mg total) by mouth 2 (two) times daily. 04/15/17  Yes Tresa Garter, MD    Objective:   Vitals:   04/15/17 1601  BP: 116/74  Pulse: 77  Resp: 18  Temp: 98.7 F  (37.1 C)  TempSrc: Oral  SpO2: 100%  Weight: 266 lb (120.7 kg)  Height: 6' 2.5" (1.892 m)   Exam General appearance : Awake, alert, not in any distress. Speech Clear. Not toxic looking, obese, poor dentition HEENT: Atraumatic and Normocephalic, pupils equally reactive to light and accomodation Neck: Supple, no JVD. No cervical lymphadenopathy.  Chest: Good air entry bilaterally, no added sounds  CVS: S1 S2 regular, no murmurs.  Abdomen: Bowel sounds present, Non tender and not distended with no gaurding, rigidity or rebound. Extremities: B/L Lower Ext shows no edema, both legs are warm to touch Neurology: Awake alert, and oriented X 3, CN II-XII intact, Non focal Skin: No Rash  Data Review Lab Results  Component Value Date  HGBA1C 5.8 04/15/2017   HGBA1C 13.9 (H) 12/13/2016   HGBA1C 11.7 11/07/2016    Assessment & Plan   1. Type 2 diabetes mellitus treated without insulin (HCC)  - POCT A1C down to 5.8% from 13.9%! - Glucose (CBG) - Patient is cleared for dental procedure from medical standpoint Continue - metFORMIN (GLUCOPHAGE) 1000 MG tablet; Take 1 tablet (1,000 mg total) by mouth 2 (two) times daily with a meal.  Dispense: 180 tablet; Refill: 3  - Reduce Insulin dose to 15 units QHS from 36 units  - Insulin Glargine (LANTUS SOLOSTAR) 100 UNIT/ML Solostar Pen; Inject 15 Units into the skin daily at 10 pm.  Dispense: 15 mL; Refill: 2  2. Acute diastolic congestive heart failure (HCC) Refill - furosemide (LASIX) 40 MG tablet; Take 1 tablet (40 mg total) by mouth daily.  Dispense: 90 tablet; Refill: 3  3. Generalized anxiety disorder  - hydrOXYzine (ATARAX/VISTARIL) 25 MG tablet; Take 1 tablet (25 mg total) by mouth 3 (three) times daily as needed for anxiety.  Dispense: 90 tablet; Refill: 3 - DULoxetine (CYMBALTA) 30 MG capsule; Take 1 capsule (30 mg total) by mouth daily.  Dispense: 90 capsule; Refill: 3  4. Essential hypertension: Controlled  - lisinopril  (PRINIVIL,ZESTRIL) 40 MG tablet; Take 1 tablet (40 mg total) by mouth daily.  Dispense: 90 tablet; Refill: 3 - metoprolol tartrate (LOPRESSOR) 50 MG tablet; Take 1 tablet (50 mg total) by mouth 2 (two) times daily.  Dispense: 180 tablet; Refill: 3 - amLODipine (NORVASC) 5 MG tablet; Take 1 tablet (5 mg total) by mouth at bedtime.  Dispense: 90 tablet; Refill: 3  5. Chronic pain syndrome  - gabapentin (NEURONTIN) 300 MG capsule; Take 1 capsule (300 mg total) by mouth 3 (three) times daily.  Dispense: 270 capsule; Refill: 3 - DULoxetine (CYMBALTA) 30 MG capsule; Take 1 capsule (30 mg total) by mouth daily.  Dispense: 90 capsule; Refill: 3  6. Acute pain of right shoulder  - DG Shoulder Right; Future  7. Gastroesophageal reflux disease without esophagitis  - famotidine (PEPCID) 20 MG tablet; Take 1 tablet (20 mg total) by mouth daily.  Dispense: 90 tablet; Refill: 3  8. Idiopathic gout of left ankle, unspecified chronicity  - colchicine 0.6 MG tablet; Take 1 tablet (0.6 mg total) by mouth daily as needed.  Dispense: 30 tablet; Refill: 3 - allopurinol (ZYLOPRIM) 300 MG tablet; Take 1 tablet (300 mg total) by mouth at bedtime.  Dispense: 90 tablet; Refill: 3  9. Morbid obesity due to excess calories (HCC)  - atorvastatin (LIPITOR) 40 MG tablet; Take 1 tablet (40 mg total) by mouth every evening.  Dispense: 90 tablet; Refill: 3  Patient have been counseled extensively about nutrition and exercise. Other issues discussed during this visit include: low cholesterol diet, weight control and daily exercise, foot care, annual eye examinations at Ophthalmology, importance of adherence with medications and regular follow-up. We also discussed long term complications of uncontrolled diabetes and hypertension.   Return in about 3 months (around 07/15/2017) for Hemoglobin A1C and Follow up, DM, Follow up HTN, Follow up Pain and comorbidities.  The patient was given clear instructions to go to ER or  return to medical center if symptoms don't improve, worsen or new problems develop. The patient verbalized understanding. The patient was told to call to get lab results if they haven't heard anything in the next week.   This note has been created with Surveyor, quantity. Any transcriptional  errors are unintentional.    Angelica Chessman, MD, [place], [place], [place], [place] and [place], [place]   04/15/2017, 4:35 PM

## 2017-04-16 ENCOUNTER — Other Ambulatory Visit: Payer: Self-pay | Admitting: Pharmacist

## 2017-04-16 MED ORDER — COLCHICINE 0.6 MG PO CAPS: ORAL_CAPSULE | ORAL | 3 refills | Status: DC

## 2017-04-17 ENCOUNTER — Ambulatory Visit: Payer: Medicaid Other | Admitting: Internal Medicine

## 2017-04-18 ENCOUNTER — Encounter (HOSPITAL_BASED_OUTPATIENT_CLINIC_OR_DEPARTMENT_OTHER): Payer: Self-pay | Admitting: *Deleted

## 2017-04-18 DIAGNOSIS — M25511 Pain in right shoulder: Secondary | ICD-10-CM

## 2017-04-18 HISTORY — DX: Pain in right shoulder: M25.511

## 2017-04-18 NOTE — Pre-Procedure Instructions (Signed)
To come for BMET 

## 2017-04-19 ENCOUNTER — Ambulatory Visit: Payer: Self-pay | Admitting: Oral Surgery

## 2017-04-23 ENCOUNTER — Encounter (HOSPITAL_BASED_OUTPATIENT_CLINIC_OR_DEPARTMENT_OTHER)
Admission: RE | Admit: 2017-04-23 | Discharge: 2017-04-23 | Disposition: A | Discharge status: Home / Self Care | Payer: Medicaid Other | Source: Ambulatory Visit | Attending: Oral Surgery | Admitting: Oral Surgery

## 2017-04-23 DIAGNOSIS — G43909 Migraine, unspecified, not intractable, without status migrainosus: Secondary | ICD-10-CM | POA: Diagnosis not present

## 2017-04-23 DIAGNOSIS — F410 Panic disorder [episodic paroxysmal anxiety] without agoraphobia: Secondary | ICD-10-CM | POA: Diagnosis not present

## 2017-04-23 DIAGNOSIS — J45909 Unspecified asthma, uncomplicated: Secondary | ICD-10-CM | POA: Diagnosis not present

## 2017-04-23 DIAGNOSIS — M1990 Unspecified osteoarthritis, unspecified site: Secondary | ICD-10-CM | POA: Diagnosis not present

## 2017-04-23 DIAGNOSIS — R0600 Dyspnea, unspecified: Secondary | ICD-10-CM | POA: Diagnosis not present

## 2017-04-23 DIAGNOSIS — E7800 Pure hypercholesterolemia, unspecified: Secondary | ICD-10-CM | POA: Diagnosis not present

## 2017-04-23 DIAGNOSIS — K219 Gastro-esophageal reflux disease without esophagitis: Secondary | ICD-10-CM | POA: Diagnosis not present

## 2017-04-23 DIAGNOSIS — Z885 Allergy status to narcotic agent status: Secondary | ICD-10-CM | POA: Diagnosis not present

## 2017-04-23 DIAGNOSIS — K047 Periapical abscess without sinus: Secondary | ICD-10-CM | POA: Diagnosis not present

## 2017-04-23 DIAGNOSIS — Z96642 Presence of left artificial hip joint: Secondary | ICD-10-CM | POA: Diagnosis not present

## 2017-04-23 DIAGNOSIS — M545 Low back pain: Secondary | ICD-10-CM | POA: Diagnosis not present

## 2017-04-23 DIAGNOSIS — J449 Chronic obstructive pulmonary disease, unspecified: Secondary | ICD-10-CM | POA: Diagnosis not present

## 2017-04-23 DIAGNOSIS — Z79899 Other long term (current) drug therapy: Secondary | ICD-10-CM | POA: Diagnosis not present

## 2017-04-23 DIAGNOSIS — Z91048 Other nonmedicinal substance allergy status: Secondary | ICD-10-CM | POA: Diagnosis not present

## 2017-04-23 DIAGNOSIS — Z8619 Personal history of other infectious and parasitic diseases: Secondary | ICD-10-CM | POA: Diagnosis not present

## 2017-04-23 DIAGNOSIS — E13630 Other specified diabetes mellitus with periodontal disease: Secondary | ICD-10-CM | POA: Diagnosis present

## 2017-04-23 DIAGNOSIS — M109 Gout, unspecified: Secondary | ICD-10-CM | POA: Diagnosis not present

## 2017-04-23 DIAGNOSIS — Z96641 Presence of right artificial hip joint: Secondary | ICD-10-CM | POA: Diagnosis not present

## 2017-04-23 DIAGNOSIS — G8929 Other chronic pain: Secondary | ICD-10-CM | POA: Diagnosis not present

## 2017-04-23 DIAGNOSIS — Z7982 Long term (current) use of aspirin: Secondary | ICD-10-CM | POA: Diagnosis not present

## 2017-04-23 DIAGNOSIS — F418 Other specified anxiety disorders: Secondary | ICD-10-CM | POA: Diagnosis not present

## 2017-04-23 DIAGNOSIS — E11630 Type 2 diabetes mellitus with periodontal disease: Secondary | ICD-10-CM | POA: Diagnosis not present

## 2017-04-23 DIAGNOSIS — Z794 Long term (current) use of insulin: Secondary | ICD-10-CM | POA: Diagnosis not present

## 2017-04-23 DIAGNOSIS — I110 Hypertensive heart disease with heart failure: Secondary | ICD-10-CM | POA: Diagnosis not present

## 2017-04-23 DIAGNOSIS — I10 Essential (primary) hypertension: Secondary | ICD-10-CM | POA: Diagnosis not present

## 2017-04-23 LAB — BASIC METABOLIC PANEL
ANION GAP: 9 (ref 5–15)
BUN: 7 mg/dL (ref 6–20)
CALCIUM: 9.9 mg/dL (ref 8.9–10.3)
CO2: 23 mmol/L (ref 22–32)
CREATININE: 0.69 mg/dL (ref 0.61–1.24)
Chloride: 100 mmol/L — ABNORMAL LOW (ref 101–111)
GFR calc Af Amer: >60 mL/min (ref >60)
GLUCOSE: 128 mg/dL — AB (ref 65–99)
Potassium: 4.8 mmol/L (ref 3.5–5.1)
Sodium: 132 mmol/L — ABNORMAL LOW (ref 135–145)

## 2017-04-23 MED ORDER — SODIUM CHLORIDE 0.9 % IV SOLN
500.0000 mg | INTRAVENOUS | Status: AC
  Administered 2017-04-24: .5 g via INTRAVENOUS
  Filled 2017-04-23: qty 2

## 2017-04-24 ENCOUNTER — Encounter (HOSPITAL_BASED_OUTPATIENT_CLINIC_OR_DEPARTMENT_OTHER)
Admission: RE | Disposition: A | Discharge status: Home / Self Care | Payer: Self-pay | Source: Ambulatory Visit | Attending: Oral Surgery

## 2017-04-24 ENCOUNTER — Ambulatory Visit (HOSPITAL_BASED_OUTPATIENT_CLINIC_OR_DEPARTMENT_OTHER): Payer: Medicaid Other | Admitting: Anesthesiology

## 2017-04-24 ENCOUNTER — Encounter (HOSPITAL_BASED_OUTPATIENT_CLINIC_OR_DEPARTMENT_OTHER): Payer: Self-pay | Admitting: Emergency Medicine

## 2017-04-24 ENCOUNTER — Ambulatory Visit (HOSPITAL_BASED_OUTPATIENT_CLINIC_OR_DEPARTMENT_OTHER)
Admission: RE | Admit: 2017-04-24 | Discharge: 2017-04-24 | Disposition: A | Discharge status: Home / Self Care | Payer: Medicaid Other | Source: Ambulatory Visit | Attending: Oral Surgery | Admitting: Oral Surgery

## 2017-04-24 DIAGNOSIS — F410 Panic disorder [episodic paroxysmal anxiety] without agoraphobia: Secondary | ICD-10-CM | POA: Insufficient documentation

## 2017-04-24 DIAGNOSIS — Z79899 Other long term (current) drug therapy: Secondary | ICD-10-CM | POA: Insufficient documentation

## 2017-04-24 DIAGNOSIS — K047 Periapical abscess without sinus: Secondary | ICD-10-CM | POA: Insufficient documentation

## 2017-04-24 DIAGNOSIS — I10 Essential (primary) hypertension: Secondary | ICD-10-CM | POA: Diagnosis not present

## 2017-04-24 DIAGNOSIS — M1990 Unspecified osteoarthritis, unspecified site: Secondary | ICD-10-CM | POA: Insufficient documentation

## 2017-04-24 DIAGNOSIS — Z794 Long term (current) use of insulin: Secondary | ICD-10-CM | POA: Insufficient documentation

## 2017-04-24 DIAGNOSIS — K219 Gastro-esophageal reflux disease without esophagitis: Secondary | ICD-10-CM | POA: Insufficient documentation

## 2017-04-24 DIAGNOSIS — E7800 Pure hypercholesterolemia, unspecified: Secondary | ICD-10-CM | POA: Insufficient documentation

## 2017-04-24 DIAGNOSIS — E11630 Type 2 diabetes mellitus with periodontal disease: Secondary | ICD-10-CM | POA: Insufficient documentation

## 2017-04-24 DIAGNOSIS — Z91048 Other nonmedicinal substance allergy status: Secondary | ICD-10-CM | POA: Insufficient documentation

## 2017-04-24 DIAGNOSIS — M109 Gout, unspecified: Secondary | ICD-10-CM | POA: Insufficient documentation

## 2017-04-24 DIAGNOSIS — G8929 Other chronic pain: Secondary | ICD-10-CM | POA: Insufficient documentation

## 2017-04-24 DIAGNOSIS — F418 Other specified anxiety disorders: Secondary | ICD-10-CM | POA: Insufficient documentation

## 2017-04-24 DIAGNOSIS — Z7982 Long term (current) use of aspirin: Secondary | ICD-10-CM | POA: Insufficient documentation

## 2017-04-24 DIAGNOSIS — J45909 Unspecified asthma, uncomplicated: Secondary | ICD-10-CM | POA: Insufficient documentation

## 2017-04-24 DIAGNOSIS — Z96641 Presence of right artificial hip joint: Secondary | ICD-10-CM | POA: Insufficient documentation

## 2017-04-24 DIAGNOSIS — Z6834 Body mass index (BMI) 34.0-34.9, adult: Secondary | ICD-10-CM | POA: Insufficient documentation

## 2017-04-24 DIAGNOSIS — Z885 Allergy status to narcotic agent status: Secondary | ICD-10-CM | POA: Insufficient documentation

## 2017-04-24 DIAGNOSIS — G43909 Migraine, unspecified, not intractable, without status migrainosus: Secondary | ICD-10-CM | POA: Insufficient documentation

## 2017-04-24 DIAGNOSIS — Z8619 Personal history of other infectious and parasitic diseases: Secondary | ICD-10-CM | POA: Insufficient documentation

## 2017-04-24 DIAGNOSIS — I110 Hypertensive heart disease with heart failure: Secondary | ICD-10-CM | POA: Insufficient documentation

## 2017-04-24 DIAGNOSIS — E669 Obesity, unspecified: Secondary | ICD-10-CM | POA: Insufficient documentation

## 2017-04-24 DIAGNOSIS — R0600 Dyspnea, unspecified: Secondary | ICD-10-CM | POA: Insufficient documentation

## 2017-04-24 DIAGNOSIS — J449 Chronic obstructive pulmonary disease, unspecified: Secondary | ICD-10-CM | POA: Insufficient documentation

## 2017-04-24 DIAGNOSIS — M545 Low back pain: Secondary | ICD-10-CM | POA: Insufficient documentation

## 2017-04-24 DIAGNOSIS — Z96642 Presence of left artificial hip joint: Secondary | ICD-10-CM | POA: Insufficient documentation

## 2017-04-24 HISTORY — DX: Cough: R05

## 2017-04-24 HISTORY — DX: Long term (current) use of insulin: Z79.4

## 2017-04-24 HISTORY — DX: Type 2 diabetes mellitus without complications: E11.9

## 2017-04-24 HISTORY — DX: Complete loss of teeth, unspecified cause, unspecified class: K08.109

## 2017-04-24 HISTORY — DX: Chronic cough: R05.3

## 2017-04-24 HISTORY — DX: Pain in right shoulder: M25.511

## 2017-04-24 HISTORY — DX: Unspecified osteoarthritis, unspecified site: M19.90

## 2017-04-24 HISTORY — DX: Anodontia: K00.0

## 2017-04-24 HISTORY — PX: MULTIPLE EXTRACTIONS WITH ALVEOLOPLASTY: SHX5342

## 2017-04-24 HISTORY — DX: Reserved for inherently not codable concepts without codable children: IMO0001

## 2017-04-24 LAB — GLUCOSE, CAPILLARY
Glucose-Capillary: 111 mg/dL — ABNORMAL HIGH (ref 65–99)
Glucose-Capillary: 139 mg/dL — ABNORMAL HIGH (ref 65–99)

## 2017-04-24 SURGERY — MULTIPLE EXTRACTION WITH ALVEOLOPLASTY
Anesthesia: General | Site: Mouth

## 2017-04-24 MED ORDER — HEPARIN SOD (PORK) LOCK FLUSH 100 UNIT/ML IV SOLN
INTRAVENOUS | Status: AC
  Filled 2017-04-24: qty 5

## 2017-04-24 MED ORDER — SUCCINYLCHOLINE CHLORIDE 20 MG/ML IJ SOLN
INTRAMUSCULAR | Status: DC | PRN
  Administered 2017-04-24: 120 mg via INTRAVENOUS

## 2017-04-24 MED ORDER — SUGAMMADEX SODIUM 500 MG/5ML IV SOLN
INTRAVENOUS | Status: DC | PRN
  Administered 2017-04-24: 500 mg via INTRAVENOUS

## 2017-04-24 MED ORDER — LIDOCAINE 2% (20 MG/ML) 5 ML SYRINGE
INTRAMUSCULAR | Status: AC
  Filled 2017-04-24: qty 5

## 2017-04-24 MED ORDER — HYDROMORPHONE HCL 1 MG/ML IJ SOLN: 0.2500 mg | INTRAMUSCULAR | Status: DC | PRN

## 2017-04-24 MED ORDER — LIDOCAINE-EPINEPHRINE 2 %-1:100000 IJ SOLN
INTRAMUSCULAR | Status: DC | PRN
  Administered 2017-04-24: 6.8 mL via INTRADERMAL

## 2017-04-24 MED ORDER — BUPIVACAINE-EPINEPHRINE 0.5% -1:200000 IJ SOLN
INTRAMUSCULAR | Status: DC | PRN
  Administered 2017-04-24: 3.4 mL

## 2017-04-24 MED ORDER — DEXAMETHASONE SODIUM PHOSPHATE 10 MG/ML IJ SOLN
INTRAMUSCULAR | Status: AC
  Filled 2017-04-24: qty 1

## 2017-04-24 MED ORDER — PROPOFOL 10 MG/ML IV BOLUS
INTRAVENOUS | Status: DC | PRN
  Administered 2017-04-24: 250 mg via INTRAVENOUS
  Administered 2017-04-24: 50 mg via INTRAVENOUS
  Administered 2017-04-24: 100 mg via INTRAVENOUS

## 2017-04-24 MED ORDER — LIDOCAINE-EPINEPHRINE 2 %-1:100000 IJ SOLN
INTRAMUSCULAR | Status: AC
  Filled 2017-04-24: qty 8.5

## 2017-04-24 MED ORDER — ONDANSETRON HCL 4 MG/2ML IJ SOLN
INTRAMUSCULAR | Status: DC | PRN
  Administered 2017-04-24: 4 mg via INTRAVENOUS

## 2017-04-24 MED ORDER — SUCCINYLCHOLINE CHLORIDE 200 MG/10ML IV SOSY
PREFILLED_SYRINGE | INTRAVENOUS | Status: AC
Start: 2017-04-24 — End: ?
  Filled 2017-04-24: qty 10

## 2017-04-24 MED ORDER — PROPOFOL 500 MG/50ML IV EMUL
INTRAVENOUS | Status: AC
  Filled 2017-04-24: qty 50

## 2017-04-24 MED ORDER — MIDAZOLAM HCL 2 MG/2ML IJ SOLN
1.0000 mg | INTRAMUSCULAR | Status: DC | PRN
  Administered 2017-04-24: 2 mg via INTRAVENOUS

## 2017-04-24 MED ORDER — OXYMETAZOLINE HCL 0.05 % NA SOLN
NASAL | Status: AC
Start: 2017-04-24 — End: ?
  Filled 2017-04-24: qty 15

## 2017-04-24 MED ORDER — PROMETHAZINE HCL 25 MG/ML IJ SOLN: 6.2500 mg | INTRAMUSCULAR | Status: DC | PRN

## 2017-04-24 MED ORDER — LACTATED RINGERS IV SOLN
INTRAVENOUS | Status: DC
  Administered 2017-04-24 (×2): via INTRAVENOUS

## 2017-04-24 MED ORDER — ALBUTEROL SULFATE HFA 108 (90 BASE) MCG/ACT IN AERS
INHALATION_SPRAY | RESPIRATORY_TRACT | Status: DC | PRN
  Administered 2017-04-24: 2 via RESPIRATORY_TRACT

## 2017-04-24 MED ORDER — BUPIVACAINE-EPINEPHRINE (PF) 0.5% -1:200000 IJ SOLN
INTRAMUSCULAR | Status: AC
  Filled 2017-04-24: qty 9

## 2017-04-24 MED ORDER — ROCURONIUM BROMIDE 100 MG/10ML IV SOLN
INTRAVENOUS | Status: DC | PRN
  Administered 2017-04-24: 30 mg via INTRAVENOUS

## 2017-04-24 MED ORDER — SCOPOLAMINE 1 MG/3DAYS TD PT72: 1.0000 | MEDICATED_PATCH | Freq: Once | TRANSDERMAL | Status: DC | PRN

## 2017-04-24 MED ORDER — HEPARIN (PORCINE) IN NACL 2-0.9 UNIT/ML-% IJ SOLN
INTRAMUSCULAR | Status: AC
  Filled 2017-04-24: qty 500

## 2017-04-24 MED ORDER — LIDOCAINE 2% (20 MG/ML) 5 ML SYRINGE
INTRAMUSCULAR | Status: DC | PRN
  Administered 2017-04-24: 100 mg via INTRAVENOUS

## 2017-04-24 MED ORDER — FENTANYL CITRATE (PF) 100 MCG/2ML IJ SOLN
INTRAMUSCULAR | Status: AC
  Filled 2017-04-24: qty 4

## 2017-04-24 MED ORDER — MIDAZOLAM HCL 2 MG/2ML IJ SOLN
INTRAMUSCULAR | Status: AC
  Filled 2017-04-24: qty 2

## 2017-04-24 MED ORDER — DEXAMETHASONE SODIUM PHOSPHATE 4 MG/ML IJ SOLN
INTRAMUSCULAR | Status: DC | PRN
  Administered 2017-04-24: 5 mg via INTRAVENOUS

## 2017-04-24 MED ORDER — FENTANYL CITRATE (PF) 100 MCG/2ML IJ SOLN
50.0000 ug | INTRAMUSCULAR | Status: DC | PRN
  Administered 2017-04-24 (×2): 100 ug via INTRAVENOUS

## 2017-04-24 MED ORDER — BUPIVACAINE-EPINEPHRINE (PF) 0.25% -1:200000 IJ SOLN
INTRAMUSCULAR | Status: AC
  Filled 2017-04-24: qty 30

## 2017-04-24 MED ORDER — ONDANSETRON HCL 4 MG/2ML IJ SOLN
INTRAMUSCULAR | Status: AC
  Filled 2017-04-24: qty 2

## 2017-04-24 SURGICAL SUPPLY — 41 items
BLADE SURG 15 STRL LF DISP TIS (BLADE) ×2 IMPLANT
BLADE SURG 15 STRL SS (BLADE) ×2
BUR 701 1.2X59 5PK (BURR) IMPLANT
BUR 702 1.6X59 5PK (BURR) IMPLANT
BUR 8 ROUND 2.3X65 5PK (BURR) IMPLANT
BUR OVAL 4.0X59 (BURR) IMPLANT
CANISTER SUCT 1200ML W/VALVE (MISCELLANEOUS) ×2 IMPLANT
CATH ROBINSON RED A/P 10FR (CATHETERS) IMPLANT
COVER BACK TABLE 60X90IN (DRAPES) ×2 IMPLANT
COVER MAYO STAND STRL (DRAPES) ×2 IMPLANT
DRAPE U-SHAPE 76X120 STRL (DRAPES) ×2 IMPLANT
GAUZE PACKING IODOFORM 1/4X15 (GAUZE/BANDAGES/DRESSINGS) IMPLANT
GLOVE BIO SURGEON STRL SZ 6.5 (GLOVE) IMPLANT
GLOVE BIOGEL PI IND STRL 8 (GLOVE) ×1 IMPLANT
GLOVE BIOGEL PI INDICATOR 8 (GLOVE) ×1
GLOVE ORTHO TXT STRL SZ7.5 (GLOVE) IMPLANT
GLOVE SURG SS PI 6.5 STRL IVOR (GLOVE) ×4 IMPLANT
GLOVE SURG SS PI 7.5 STRL IVOR (GLOVE) ×2 IMPLANT
GOWN STRL REUS W/ TWL LRG LVL3 (GOWN DISPOSABLE) ×3 IMPLANT
GOWN STRL REUS W/TWL LRG LVL3 (GOWN DISPOSABLE) ×3
IV NS 500ML (IV SOLUTION) ×1
IV NS 500ML BAXH (IV SOLUTION) ×1 IMPLANT
NEEDLE DENTAL 27 LONG (NEEDLE) ×2 IMPLANT
NS IRRIG 1000ML POUR BTL (IV SOLUTION) ×2 IMPLANT
PACK BASIN DAY SURGERY FS (CUSTOM PROCEDURE TRAY) ×2 IMPLANT
SPONGE SURGIFOAM ABS GEL 12-7 (HEMOSTASIS) ×4 IMPLANT
SUT CHROMIC 3 0 PS 2 (SUTURE) ×2 IMPLANT
SUT CHROMIC 4 0 P 3 18 (SUTURE) IMPLANT
SUT SILK 3 0 PS 1 (SUTURE) IMPLANT
SUT VIC AB 4-0 P-3 18XBRD (SUTURE) IMPLANT
SUT VIC AB 4-0 P3 18 (SUTURE)
SUT VIC AB 5-0 P-3 18X BRD (SUTURE) IMPLANT
SUT VIC AB 5-0 P3 18 (SUTURE)
SUT VICRYL 4-0 PS2 18IN ABS (SUTURE) IMPLANT
SYR 50ML LL SCALE MARK (SYRINGE) ×2 IMPLANT
TOOTHBRUSH ADULT (PERSONAL CARE ITEMS) ×2 IMPLANT
TOWEL OR 17X24 6PK STRL BLUE (TOWEL DISPOSABLE) ×2 IMPLANT
TOWEL OR NON WOVEN STRL DISP B (DISPOSABLE) IMPLANT
TUBE CONNECTING 20X1/4 (TUBING) ×2 IMPLANT
VENT IRR SPI W TUB AD (MISCELLANEOUS) ×2 IMPLANT
YANKAUER SUCT BULB TIP NO VENT (SUCTIONS) ×2 IMPLANT

## 2017-04-24 NOTE — Anesthesia Procedure Notes (Signed)
Procedure Name: Intubation Date/Time: 04/24/2017 7:44 AM Performed by: Maryella Shivers Pre-anesthesia Checklist: Patient identified, Emergency Drugs available, Suction available and Patient being monitored Patient Re-evaluated:Patient Re-evaluated prior to induction Oxygen Delivery Method: Circle system utilized Preoxygenation: Pre-oxygenation with 100% oxygen Induction Type: IV induction Ventilation: Mask ventilation with difficulty Laryngoscope Size: Glidescope and 4 Grade View: Grade II Nasal Tubes: Nasal prep performed and Nasal Rae Tube size: 7.5 mm Number of attempts: 2 Airway Equipment and Method: Video-laryngoscopy Placement Confirmation: ETT inserted through vocal cords under direct vision,  positive ETCO2 and breath sounds checked- equal and bilateral Secured at: 26 cm Tube secured with: Tape Dental Injury: Teeth and Oropharynx as per pre-operative assessment

## 2017-04-24 NOTE — Op Note (Signed)
PATIENT:  [name]  46 y.o. 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 Surgical extraction of teeth #17,21-29,31 2. Alveoloplasty x 2 quadrants  SURGEON:  Surgeon(s) and Role:    * Marua Qin, DMD - Primary  ANESTHESIA:   general  EBL:  Total I/O In: 1000 [I.V.:1000] Out: 30 [RUEAV:40]  Complications: none  Findings: gross dental caries  Indications for procedure: Is a 46 year old male with a history of diabetes is admitted in May 2018 for DKA and dental abscesses. He presents for surgical removal or remaining abscessed mandibular teeth.  Procedure: The patient was identified in preoperative holding by both anesthesia and the maxillofacial teams. Health history was reviewed. Consent was verified. The patient brought back to the operating room and placed the table in the supine position. Standard ASA leads and monitors were placed. The patient was preoxygenated, induced, and his airway was protected with a nasoendotracheal tube. The tube was taped and secured by the anesthesia care team. A throat pack was placed. The patient was then anesthetized with 2% lidocaine with 1 100,000 epinephrine carpules in the bilateral mandible.  A 15 blade was used to make a sulcular/crestal incision in the entire mandible. Full-thickness mucoperiosteal flap was elevated to the facial. Once exposed, buccal bone was removed with a Roger forcep. The retained roots at sites #17, 21, 22, 23, 24, 25, 26,31 were luxated and extracted with a dental forcep. Teeth #27, 28, 29 were luxated and extracted with a dental forcep. Extraction sockets were thoroughly curetted and irrigated. Alveoloplasty was carried out in the mandible bilaterally with a Roger forcep. The bone was then smoothed with a bone file. The wound was then thoroughly irrigated once more. Extraction sockets were then packed with Gelfoam.  The tissues were then reapproximated with 3-0 chromic gut suture placed in continuous interlocking fashion. Single interrupted 3-0 chromic gut sutures were utilized in the posterior mandible bilaterally. At this point the entire oral cavity was thoroughly irrigated. The throat pack was removed. There was noted a small cut on the left posterior soft palate that was reapproximated with a single 3-0 chromic gut suture. The patient was then returned to the anesthesia care team where he was extubated without event. He was transported to the postanesthesia care unit for recovery. He'll be discharged home once meeting appropriate discharge criteria.

## 2017-04-24 NOTE — Anesthesia Postprocedure Evaluation (Signed)
Anesthesia Post Note  Patient: [place]  Procedure(s) Performed: Procedure(s) (LRB): SURGICAL REMOVAL OF TEETH NUMBERS `7 20-29 AND 31 (N/A)     Patient location during evaluation: PACU Anesthesia Type: General Level of consciousness: awake and alert Pain management: pain level controlled Vital Signs Assessment: post-procedure vital signs reviewed and stable Respiratory status: spontaneous breathing, nonlabored ventilation, respiratory function stable and patient connected to nasal cannula oxygen Cardiovascular status: blood pressure returned to baseline and stable Postop Assessment: no apparent nausea or vomiting Anesthetic complications: no    Last Vitals:  Vitals:   04/24/17 0930 04/24/17 0958  BP: 116/82 125/82  Pulse: 74 74  Resp: 16 18  Temp:  36.9 C  SpO2: 92% 93%    Last Pain:  Vitals:   04/24/17 0958  TempSrc:   PainSc: 0-No pain                 Solon Alban P [place]

## 2017-04-24 NOTE — Transfer of Care (Signed)
Immediate Anesthesia Transfer of Care Note  Patient: [place]  Procedure(s) Performed: Procedure(s): SURGICAL REMOVAL OF TEETH NUMBERS `7 20-29 AND 31 (N/A)  Patient Location: PACU  Anesthesia Type:General  Level of Consciousness: awake, alert  and oriented  Airway & Oxygen Therapy: Patient Spontanous Breathing and Patient connected to face mask oxygen  Post-op Assessment: Report given to RN and Post -op Vital signs reviewed and stable  Post vital signs: Reviewed and stable  Last Vitals:  Vitals:   04/24/17 0700  BP: 131/80  Pulse: 77  Resp: 20  Temp: 36.9 C  SpO2: 97%    Last Pain:  Vitals:   04/24/17 0700  TempSrc: Oral  PainSc: 0-No pain         Complications: No apparent anesthesia complications

## 2017-04-24 NOTE — Interval H&P Note (Signed)
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 After consideration of risks, benefits and other options for treatment, the patient has consented to  Procedure(s): SURGICAL REMOVAL OF TEETH NUMBERS `7 20-29 AND 31 (N/A) as a surgical intervention .  The patient's history has been reviewed, patient examined, no change in status, stable for surgery.  I have reviewed the patient's chart and labs.  Questions were answered to the patient's satisfaction.     Larkin Ina Nalin Mazzocco

## 2017-04-24 NOTE — H&P (Signed)
Anesthesia H&P Update: History and Physical Exam reviewed; patient is OK for planned anesthetic and procedure. ? ?

## 2017-04-24 NOTE — Brief Op Note (Signed)
04/24/2017  8:47 AM  PATIENT:  [place]  46 y.o. 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 Surgical extraction of teeth #17,21-29,31 2. Alveoloplasty x 2 quadrants  SURGEON:  Surgeon(s) and Role:    * Savon Cobbs, DMD - Primary  ANESTHESIA:   general  EBL:  Total I/O In: 1000 [I.V.:1000] Out: 30 [Blood:30]  BLOOD ADMINISTERED:none  DRAINS: none   LOCAL MEDICATIONS USED:  LIDOCAINE   SPECIMEN:  No Specimen  DISPOSITION OF SPECIMEN:  N/A  COUNTS:  YES  TOURNIQUET:  * No tourniquets in log *  DICTATION: .Dragon Dictation  PLAN OF CARE: Discharge to home after PACU  PATIENT DISPOSITION:  PACU - hemodynamically stable.   Delay start of Pharmacological VTE agent (>24hrs) due to surgical blood loss or risk of bleeding: not applicable

## 2017-04-24 NOTE — Discharge Instructions (Signed)
See handout for home care instructions.   Post Anesthesia Home Care Instructions  Activity: Get plenty of rest for the remainder of the day. A responsible individual must stay with you for 24 hours following the procedure.  For the next 24 hours, DO NOT: -Drive a car -Paediatric nurse -Drink alcoholic beverages -Take any medication unless instructed by your physician -Make any legal decisions or sign important papers.  Meals: Start with liquid foods such as gelatin or soup. Progress to regular foods as tolerated. Avoid greasy, spicy, heavy foods. If nausea and/or vomiting occur, drink only clear liquids until the nausea and/or vomiting subsides. Call your physician if vomiting continues.  Special Instructions/Symptoms: Your throat may feel dry or sore from the anesthesia or the breathing tube placed in your throat during surgery. If this causes discomfort, gargle with warm salt water. The discomfort should disappear within 24 hours.  If you had a scopolamine patch placed behind your ear for the management of post- operative nausea and/or vomiting:  1. The medication in the patch is effective for 72 hours, after which it should be removed.  Wrap patch in a tissue and discard in the trash. Wash hands thoroughly with soap and water. 2. You may remove the patch earlier than 72 hours if you experience unpleasant side effects which may include dry mouth, dizziness or visual disturbances. 3. Avoid touching the patch. Wash your hands with soap and water after contact with the patch.

## 2017-04-24 NOTE — Anesthesia Preprocedure Evaluation (Addendum)
Anesthesia Evaluation  Patient identified by MRN, date of birth, ID band Patient awake    Reviewed: Allergy & Precautions, NPO status , Patient's Chart, lab work & pertinent test results  History of Anesthesia Complications Negative for: history of anesthetic complications  Airway Mallampati: II  TM Distance: >3 FB Neck ROM: Full    Dental  (+) Poor Dentition, Edentulous Upper, Missing,    Pulmonary asthma , COPD,  COPD inhaler, Current Smoker,    Pulmonary exam normal        Cardiovascular hypertension, Pt. on medications and Pt. on home beta blockers +CHF  Normal cardiovascular exam  ECG: SR, rate 95  ECHO: Left ventricle: The cavity size was normal. Systolic function was normal. The estimated ejection fraction was in the range of 55% to 60%. Wall motion was normal; there were no regional wall motion abnormalities. Doppler parameters are consistent with abnormal left ventricular relaxation (grade 1 diastolic dysfunction). Doppler parameters are consistent with indeterminate ventricular filling pressure.   Neuro/Psych Anxiety Depression    GI/Hepatic GERD  Medicated and Controlled,(+)     substance abuse  alcohol use,   Endo/Other  diabetes, Insulin Dependent, Oral Hypoglycemic Agents  Renal/GU negative Renal ROS     Musculoskeletal  (+) Arthritis , Gout Chronic lower back pain   Abdominal (+) + obese,   Peds  Hematology   Anesthesia Other Findings Full Beard.  Several broken and decaying teeth below. HLD   Reproductive/Obstetrics                            Anesthesia Physical  Anesthesia Plan  ASA: III  Anesthesia Plan: General   Post-op Pain Management:    Induction: Intravenous  PONV Risk Score and Plan: 3 and Ondansetron, Dexamethasone and Midazolam  Airway Management Planned: Nasal ETT and Oral ETT  Additional Equipment:   Intra-op Plan:   Post-operative Plan:  Extubation in OR  Informed Consent: I have reviewed the patients History and Physical, chart, labs and discussed the procedure including the risks, benefits and alternatives for the proposed anesthesia with the patient or authorized representative who has indicated his/her understanding and acceptance.   Dental advisory given  Plan Discussed with: CRNA and Surgeon  Anesthesia Plan Comments:        Anesthesia Quick Evaluation

## 2017-04-25 ENCOUNTER — Encounter (HOSPITAL_BASED_OUTPATIENT_CLINIC_OR_DEPARTMENT_OTHER): Payer: Self-pay | Admitting: Oral Surgery

## 2017-06-03 ENCOUNTER — Other Ambulatory Visit: Payer: Self-pay | Admitting: Internal Medicine

## 2017-06-03 DIAGNOSIS — G4733 Obstructive sleep apnea (adult) (pediatric): Secondary | ICD-10-CM

## 2017-06-26 DIAGNOSIS — E119 Type 2 diabetes mellitus without complications: Secondary | ICD-10-CM

## 2017-07-17 ENCOUNTER — Ambulatory Visit: Payer: Medicaid Other | Attending: Internal Medicine | Admitting: Internal Medicine

## 2017-07-17 ENCOUNTER — Encounter: Payer: Self-pay | Admitting: Internal Medicine

## 2017-07-17 VITALS — BP 133/89 | HR 79 | Temp 98.5°F | Ht 74.0 in | Wt 269.0 lb

## 2017-07-17 DIAGNOSIS — I110 Hypertensive heart disease with heart failure: Secondary | ICD-10-CM | POA: Insufficient documentation

## 2017-07-17 DIAGNOSIS — E7800 Pure hypercholesterolemia, unspecified: Secondary | ICD-10-CM | POA: Diagnosis not present

## 2017-07-17 DIAGNOSIS — I509 Heart failure, unspecified: Secondary | ICD-10-CM | POA: Diagnosis not present

## 2017-07-17 DIAGNOSIS — J449 Chronic obstructive pulmonary disease, unspecified: Secondary | ICD-10-CM | POA: Diagnosis not present

## 2017-07-17 DIAGNOSIS — Z885 Allergy status to narcotic agent status: Secondary | ICD-10-CM | POA: Insufficient documentation

## 2017-07-17 DIAGNOSIS — F411 Generalized anxiety disorder: Secondary | ICD-10-CM | POA: Insufficient documentation

## 2017-07-17 DIAGNOSIS — Z79899 Other long term (current) drug therapy: Secondary | ICD-10-CM | POA: Insufficient documentation

## 2017-07-17 DIAGNOSIS — Z720 Tobacco use: Secondary | ICD-10-CM

## 2017-07-17 DIAGNOSIS — K219 Gastro-esophageal reflux disease without esophagitis: Secondary | ICD-10-CM | POA: Insufficient documentation

## 2017-07-17 DIAGNOSIS — Z23 Encounter for immunization: Secondary | ICD-10-CM | POA: Diagnosis not present

## 2017-07-17 DIAGNOSIS — F17200 Nicotine dependence, unspecified, uncomplicated: Secondary | ICD-10-CM | POA: Diagnosis not present

## 2017-07-17 DIAGNOSIS — G894 Chronic pain syndrome: Secondary | ICD-10-CM | POA: Insufficient documentation

## 2017-07-17 DIAGNOSIS — E119 Type 2 diabetes mellitus without complications: Secondary | ICD-10-CM | POA: Diagnosis not present

## 2017-07-17 DIAGNOSIS — M545 Low back pain: Secondary | ICD-10-CM | POA: Diagnosis not present

## 2017-07-17 DIAGNOSIS — Z7982 Long term (current) use of aspirin: Secondary | ICD-10-CM | POA: Insufficient documentation

## 2017-07-17 DIAGNOSIS — Z96643 Presence of artificial hip joint, bilateral: Secondary | ICD-10-CM | POA: Insufficient documentation

## 2017-07-17 DIAGNOSIS — I10 Essential (primary) hypertension: Secondary | ICD-10-CM

## 2017-07-17 DIAGNOSIS — M109 Gout, unspecified: Secondary | ICD-10-CM | POA: Insufficient documentation

## 2017-07-17 DIAGNOSIS — Z7984 Long term (current) use of oral hypoglycemic drugs: Secondary | ICD-10-CM | POA: Insufficient documentation

## 2017-07-17 LAB — GLUCOSE, POCT (MANUAL RESULT ENTRY): POC GLUCOSE: 112 mg/dL — AB (ref 70–99)

## 2017-07-17 LAB — POCT GLYCOSYLATED HEMOGLOBIN (HGB A1C): HEMOGLOBIN A1C: 5.7

## 2017-07-17 MED ORDER — METFORMIN HCL 1000 MG PO TABS: 500.0000 mg | ORAL_TABLET | Freq: Two times a day (BID) | ORAL | 3 refills | Status: DC

## 2017-07-17 MED ORDER — ACETAMINOPHEN-CODEINE #3 300-30 MG PO TABS: 1.0000 | ORAL_TABLET | ORAL | 0 refills | Status: DC | PRN

## 2017-07-17 NOTE — Patient Instructions (Signed)
Tobacco Use Disorder Tobacco use disorder (TUD) is a mental disorder. It is the long-term use of tobacco in spite of related health problems or difficulty with normal life activities. Tobacco is most commonly smoked as cigarettes and less commonly as cigars or pipes. Smokeless chewing tobacco and snuff are also popular. People with TUD get a feeling of extreme pleasure (euphoria) from using tobacco and have a desire to use it again and again. Repeated use of tobacco can cause problems. The addictive effects of tobacco are due mainly tothe ingredient nicotine. Nicotine also causes a rush of adrenaline (epinephrine) in the body. This leads to increased blood pressure, heart rate, and breathing rate. These changes may cause problems for people with high blood pressure, weak hearts, or lung disease. High doses of nicotine in children and pets can lead to seizures and death. Tobacco contains a number of other unsafe chemicals. These chemicals are especially harmful when inhaled as smoke and can damage almost every organ in the body. Smokers live shorter lives than nonsmokers and are at risk of dying from a number of diseases and cancers. Tobacco smoke can also cause health problems for nonsmokers (due to inhaling secondhand smoke). Smoking is also a fire hazard. TUD usually starts in the late teenage years and is most common in young adults between the ages of 18 and 25 years. People who start smoking earlier in life are more likely to continue smoking as adults. TUD is somewhat more common in men than women. People with TUD are at higher risk for using alcohol and other drugs of abuse. What increases the risk? Risk factors for TUD include:  Having family members with the disorder.  Being around people who use tobacco.  Having an existing mental health issue such as schizophrenia, depression, bipolar disorder, ADHD, or posttraumatic stress disorder (PTSD).  What are the signs or symptoms? People with  tobacco use disorder have two or more of the following signs and symptoms within 12 months:  Use of more tobacco over a longer period than intended.  Not able to cut down or control tobacco use.  A lot of time spent obtaining or using tobacco.  Strong desire or urge to use tobacco (craving). Cravings may last for 6 months or longer after quitting.  Use of tobacco even when use leads to major problems at work, school, or home.  Use of tobacco even when use leads to relationship problems.  Giving up or cutting down on important life activities because of tobacco use.  Repeatedly using tobacco in situations where it puts you or others in physical danger, like smoking in bed.  Use of tobacco even when it is known that a physical or mental problem is likely related to tobacco use. ? Physical problems are numerous and may include chronic bronchitis, emphysema, lung and other cancers, gum disease, high blood pressure, heart disease, and stroke. ? Mental problems caused by tobacco may include difficulty sleeping and anxiety.  Need to use greater amounts of tobacco to get the same effect. This means you have developed a tolerance.  Withdrawal symptoms as a result of stopping or rapidly cutting back use. These symptoms may last a month or more after quitting and include the following: ? Depressed, anxious, or irritable mood. ? Difficulty concentrating. ? Increased appetite. ? Restlessness or trouble sleeping. ? Use of tobacco to avoid withdrawal symptoms.  How is this diagnosed? Tobacco use disorder is diagnosed by your health care provider. A diagnosis may be made by:    Your health care provider asking questions about your tobacco use and any problems it may be causing.  A physical exam.  Lab tests.  You may be referred to a mental health professional or addiction specialist.  The severity of tobacco use disorder depends on the number of signs and symptoms you have:  Mild-Two or  three symptoms.  Moderate-Four or five symptoms.  Severe-Six or more symptoms.  How is this treated? Many people with tobacco use disorder are unable to quit on their own and need help. Treatment options include the following:  Nicotine replacement therapy (NRT). NRT provides nicotine without the other harmful chemicals in tobacco. NRT gradually lowers the dosage of nicotine in the body and reduces withdrawal symptoms. NRT is available in over-the-counter forms (gum, lozenges, and skin patches) as well as prescription forms (mouth inhaler and nasal spray).  Medicines.This may include: ? Antidepressant medicine that may reduce nicotine cravings. ? A medicine that acts on nicotine receptors in the brain to reduce cravings and withdrawal symptoms. It may also block the effects of tobacco in people with TUD who relapse.  Counseling or talk therapy. A form of talk therapy called behavioral therapy is commonly used to treat people with TUD. Behavioral therapy looks at triggers for tobacco use, how to avoid them, and how to cope with cravings. It is most effective in person or by phone but is also available in self-help forms (books and Internet websites).  Support groups. These provide emotional support, advice, and guidance for quitting tobacco.  The most effective treatment for TUD is usually a combination of medicine, talk therapy, and support groups. Follow these instructions at home:  Keep all follow-up visits as directed by your health care provider. This is important.  Take medicines only as directed by your health care provider.  Check with your health care provider before starting new prescription or over-the-counter medicines. Contact a health care provider if:  You are not able to take your medicines as prescribed.  Treatment is not helping your TUD and your symptoms get worse. Get help right away if:  You have serious thoughts about hurting yourself or others.  You have  trouble breathing, chest pain, sudden weakness, or sudden numbness in part of your body. This information is not intended to replace advice given to you by your health care provider. Make sure you discuss any questions you have with your health care provider. Document Released: 03/21/2004 Document Revised: 03/18/2016 Document Reviewed: 09/11/2013 Elsevier Interactive Patient Education  2018 [employer]. Diabetes and Foot Care Diabetes may cause you to have problems because of poor blood supply (circulation) to your feet and legs. This may cause the skin on your feet to become thinner, break easier, and heal more slowly. Your skin may become dry, and the skin may peel and crack. You may also have nerve damage in your legs and feet causing decreased feeling in them. You may not notice minor injuries to your feet that could lead to infections or more serious problems. Taking care of your feet is one of the most important things you can do for yourself. Follow these instructions at home:  Wear shoes at all times, even in the house. Do not go barefoot. Bare feet are easily injured.  Check your feet daily for blisters, cuts, and redness. If you cannot see the bottom of your feet, use a mirror or ask someone for help.  Wash your feet with warm water (do not use hot water) and mild soap.  Then pat your feet and the areas between your toes until they are completely dry. Do not soak your feet as this can dry your skin.  Apply a moisturizing lotion or petroleum jelly (that does not contain alcohol and is unscented) to the skin on your feet and to dry, brittle toenails. Do not apply lotion between your toes.  Trim your toenails straight across. Do not dig under them or around the cuticle. File the edges of your nails with an emery board or nail file.  Do not cut corns or calluses or try to remove them with medicine.  Wear clean socks or stockings every day. Make sure they are not too tight. Do not wear  knee-high stockings since they may decrease blood flow to your legs.  Wear shoes that fit properly and have enough cushioning. To break in new shoes, wear them for just a few hours a day. This prevents you from injuring your feet. Always look in your shoes before you put them on to be sure there are no objects inside.  Do not cross your legs. This may decrease the blood flow to your feet.  If you find a minor scrape, cut, or break in the skin on your feet, keep it and the skin around it clean and dry. These areas may be cleansed with mild soap and water. Do not cleanse the area with peroxide, alcohol, or iodine.  When you remove an adhesive bandage, be sure not to damage the skin around it.  If you have a wound, look at it several times a day to make sure it is healing.  Do not use heating pads or hot water bottles. They may burn your skin. If you have lost feeling in your feet or legs, you may not know it is happening until it is too late.  Make sure your health care provider performs a complete foot exam at least annually or more often if you have foot problems. Report any cuts, sores, or bruises to your health care provider immediately. Contact a health care provider if:  You have an injury that is not healing.  You have cuts or breaks in the skin.  You have an ingrown nail.  You notice redness on your legs or feet.  You feel burning or tingling in your legs or feet.  You have pain or cramps in your legs and feet.  Your legs or feet are numb.  Your feet always feel cold. Get help right away if:  There is increasing redness, swelling, or pain in or around a wound.  There is a red line that goes up your leg.  Pus is coming from a wound.  You develop a fever or as directed by your health care provider.  You notice a bad smell coming from an ulcer or wound. This information is not intended to replace advice given to you by your health care provider. Make sure you discuss any  questions you have with your health care provider. Document Released: 07/13/2000 Document Revised: 12/22/2015 Document Reviewed: 12/23/2012 Elsevier Interactive Patient Education  2017 [place]. Diabetes Mellitus and Nutrition When you have diabetes (diabetes mellitus), it is very important to have healthy eating habits because your blood sugar (glucose) levels are greatly affected by what you eat and drink. Eating healthy foods in the appropriate amounts, at about the same times every day, can help you:  Control your blood glucose.  Lower your risk of heart disease.  Improve your blood pressure.  Reach or maintain a healthy weight.  Every person with diabetes is different, and each person has different needs for a meal plan. Your health care provider may recommend that you work with a diet and nutrition specialist (dietitian) to make a meal plan that is best for you. Your meal plan may vary depending on factors such as:  The calories you need.  The medicines you take.  Your weight.  Your blood glucose, blood pressure, and cholesterol levels.  Your activity level.  Other health conditions you have, such as heart or kidney disease.  How do carbohydrates affect me? Carbohydrates affect your blood glucose level more than any other type of food. Eating carbohydrates naturally increases the amount of glucose in your blood. Carbohydrate counting is a method for keeping track of how many carbohydrates you eat. Counting carbohydrates is important to keep your blood glucose at a healthy level, especially if you use insulin or take certain oral diabetes medicines. It is important to know how many carbohydrates you can safely have in each meal. This is different for every person. Your dietitian can help you calculate how many carbohydrates you should have at each meal and for snack. Foods that contain carbohydrates include:  Bread, cereal, rice, pasta, and crackers.  Potatoes and  corn.  Peas, beans, and lentils.  Milk and yogurt.  Fruit and juice.  Desserts, such as cakes, cookies, ice cream, and candy.  How does alcohol affect me? Alcohol can cause a sudden decrease in blood glucose (hypoglycemia), especially if you use insulin or take certain oral diabetes medicines. Hypoglycemia can be a life-threatening condition. Symptoms of hypoglycemia (sleepiness, dizziness, and confusion) are similar to symptoms of having too much alcohol. If your health care provider says that alcohol is safe for you, follow these guidelines:  Limit alcohol intake to no more than 1 drink per day for nonpregnant women and 2 drinks per day for men. One drink equals 12 oz of beer, 5 oz of wine, or 1 oz of hard liquor.  Do not drink on an empty stomach.  Keep yourself hydrated with water, diet soda, or unsweetened iced tea.  Keep in mind that regular soda, juice, and other mixers may contain a lot of sugar and must be counted as carbohydrates.  What are tips for following this plan? Reading food labels  Start by checking the serving size on the label. The amount of calories, carbohydrates, fats, and other nutrients listed on the label are based on one serving of the food. Many foods contain more than one serving per package.  Check the total grams (g) of carbohydrates in one serving. You can calculate the number of servings of carbohydrates in one serving by dividing the total carbohydrates by 15. For example, if a food has 30 g of total carbohydrates, it would be equal to 2 servings of carbohydrates.  Check the number of grams (g) of saturated and trans fats in one serving. Choose foods that have low or no amount of these fats.  Check the number of milligrams (mg) of sodium in one serving. Most people should limit total sodium intake to less than 2,300 mg per day.  Always check the nutrition information of foods labeled as "low-fat" or "nonfat". These foods may be higher in added  sugar or refined carbohydrates and should be avoided.  Talk to your dietitian to identify your daily goals for nutrients listed on the label. Shopping  Avoid buying canned, premade, or processed foods. These foods tend  to be high in fat, sodium, and added sugar.  Shop around the outside edge of the grocery store. This includes fresh fruits and vegetables, bulk grains, fresh meats, and fresh dairy. Cooking  Use low-heat cooking methods, such as baking, instead of high-heat cooking methods like deep frying.  Cook using healthy oils, such as olive, canola, or sunflower oil.  Avoid cooking with butter, cream, or high-fat meats. Meal planning  Eat meals and snacks regularly, preferably at the same times every day. Avoid going long periods of time without eating.  Eat foods high in fiber, such as fresh fruits, vegetables, beans, and whole grains. Talk to your dietitian about how many servings of carbohydrates you can eat at each meal.  Eat 4-6 ounces of lean protein each day, such as lean meat, chicken, fish, eggs, or tofu. 1 ounce is equal to 1 ounce of meat, chicken, or fish, 1 egg, or 1/4 cup of tofu.  Eat some foods each day that contain healthy fats, such as avocado, nuts, seeds, and fish. Lifestyle   Check your blood glucose regularly.  Exercise at least 30 minutes 5 or more days each week, or as told by your health care provider.  Take medicines as told by your health care provider.  Do not use any products that contain nicotine or tobacco, such as cigarettes and e-cigarettes. If you need help quitting, ask your health care provider.  Work with a Social worker or diabetes educator to identify strategies to manage stress and any emotional and social challenges. What are some questions to ask my health care provider?  Do I need to meet with a diabetes educator?  Do I need to meet with a dietitian?  What number can I call if I have questions?  When are the best times to check my  blood glucose? Where to find more information:  American Diabetes Association: diabetes.org/food-and-fitness/food  Academy of Nutrition and Dietetics: PokerClues.dk  [employer] of Diabetes and Digestive and Kidney Diseases (NIH): ContactWire.be Summary  A healthy meal plan will help you control your blood glucose and maintain a healthy lifestyle.  Working with a diet and nutrition specialist (dietitian) can help you make a meal plan that is best for you.  Keep in mind that carbohydrates and alcohol have immediate effects on your blood glucose levels. It is important to count carbohydrates and to use alcohol carefully. This information is not intended to replace advice given to you by your health care provider. Make sure you discuss any questions you have with your health care provider. Document Released: 04/12/2005 Document Revised: 08/20/2016 Document Reviewed: 08/20/2016 Elsevier Interactive Patient Education  2018 [employer]. Diabetes Mellitus and Exercise Exercising regularly is important for your overall health, especially when you have diabetes (diabetes mellitus). Exercising is not only about losing weight. It has many health benefits, such as increasing muscle strength and bone density and reducing body fat and stress. This leads to improved fitness, flexibility, and endurance, all of which result in better overall health. Exercise has additional benefits for people with diabetes, including:  Reducing appetite.  Helping to lower and control blood glucose.  Lowering blood pressure.  Helping to control amounts of fatty substances (lipids) in the blood, such as cholesterol and triglycerides.  Helping the body to respond better to insulin (improving insulin sensitivity).  Reducing how much insulin the body needs.  Decreasing the risk for heart  disease by: ? Lowering cholesterol and triglyceride levels. ? Increasing the levels of good cholesterol. ? Lowering  blood glucose levels.  What is my activity plan? Your health care provider or certified diabetes educator can help you make a plan for the type and frequency of exercise (activity plan) that works for you. Make sure that you:  Do at least 150 minutes of moderate-intensity or vigorous-intensity exercise each week. This could be brisk walking, biking, or water aerobics. ? Do stretching and strength exercises, such as yoga or weightlifting, at least 2 times a week. ? Spread out your activity over at least 3 days of the week.  Get some form of physical activity every day. ? Do not go more than 2 days in a row without some kind of physical activity. ? Avoid being inactive for more than 90 minutes at a time. Take frequent breaks to walk or stretch.  Choose a type of exercise or activity that you enjoy, and set realistic goals.  Start slowly, and gradually increase the intensity of your exercise over time.  What do I need to know about managing my diabetes?  Check your blood glucose before and after exercising. ? If your blood glucose is higher than 240 mg/dL (13.3 mmol/L) before you exercise, check your urine for ketones. If you have ketones in your urine, do not exercise until your blood glucose returns to normal.  Know the symptoms of low blood glucose (hypoglycemia) and how to treat it. Your risk for hypoglycemia increases during and after exercise. Common symptoms of hypoglycemia can include: ? Hunger. ? Anxiety. ? Sweating and feeling clammy. ? Confusion. ? Dizziness or feeling light-headed. ? Increased heart rate or palpitations. ? Blurry vision. ? Tingling or numbness around the mouth, lips, or tongue. ? Tremors or shakes. ? Irritability.  Keep a rapid-acting carbohydrate snack available before, during, and after exercise to help prevent or treat  hypoglycemia.  Avoid injecting insulin into areas of the body that are going to be exercised. For example, avoid injecting insulin into: ? The arms, when playing tennis. ? The legs, when jogging.  Keep records of your exercise habits. Doing this can help you and your health care provider adjust your diabetes management plan as needed. Write down: ? Food that you eat before and after you exercise. ? Blood glucose levels before and after you exercise. ? The type and amount of exercise you have done. ? When your insulin is expected to peak, if you use insulin. Avoid exercising at times when your insulin is peaking.  When you start a new exercise or activity, work with your health care provider to make sure the activity is safe for you, and to adjust your insulin, medicines, or food intake as needed.  Drink plenty of water while you exercise to prevent dehydration or heat stroke. Drink enough fluid to keep your urine clear or pale yellow. This information is not intended to replace advice given to you by your health care provider. Make sure you discuss any questions you have with your health care provider. Document Released: 10/06/2003 Document Revised: 02/03/2016 Document Reviewed: 12/26/2015 Elsevier Interactive Patient Education  2018 [employer].

## 2017-07-17 NOTE — Progress Notes (Signed)
Preston [name], is a 46 y.o. male  JGG:[id number]  LYY:[id number]  DOB - [birth year]  Chief Complaint  Patient presents with  . Diabetes  . Hypertension      Subjective:   Preston [name] is a 46 y.o. male with medical history for hypertension, left ventricular diastolic dysfunction, asthma with recurrent bronchitis, chronic low back pain, arthritis S/P total left hip arthroplasty on 06/25/2016 and total right hip arthroplasty on 09/10/2016 who presents here today with his wife and daughter for a follow up visit. He has recently noticed increasing pain mostly in his bilateral hip joints and lower back. This pain was better immediately post surgery but has returned with higher intensity lately, giving him so much concern. No associated numbness or tingling, no associated fecal or urinary incontinence. His BS is controlled, he has not used his insulin in over a month, and has had low blood sugar episode 2x when he had to quickly drink soda. Patient has No headache, No chest pain, No abdominal pain - No Nausea, No new weakness tingling or numbness, No Cough - SOB.  Problem  Need for Influenza Vaccination    ALLERGIES: Allergies  Allergen Reactions  . Morphine And Related Itching and Nausea And Vomiting  . Adhesive [Tape] Itching and Rash    PAST MEDICAL HISTORY: Past Medical History:  Diagnosis Date  . Anxiety   . Asthma    daily inhaler  . CHF (congestive heart failure) ([place])    no cardiologist  . Chronic cough    states due to COPD  . Chronic lower back pain   . COPD (chronic obstructive pulmonary disease) (HCC)    no home O2  . Dyspnea    with humidity and high pollen count  . Edentulous    upper gum  . GERD (gastroesophageal reflux disease)   . Gout   . History of Clostridium difficile infection 2014  . Hypercholesterolemia   . Hypertension    states under control with med., has been on med. x 4 yr.  . Insulin dependent diabetes mellitus ([place])   . Osteoarthritis    bilat. hip  . Panic attacks   . Shoulder pain, right 04/18/2017    MEDICATIONS AT HOME: Prior to Admission medications   Medication Sig Start Date End Date Taking? Authorizing Provider  allopurinol (ZYLOPRIM) 300 MG tablet Take 1 tablet (300 mg total) by mouth at bedtime. 04/15/17  Yes Indigo Barbian E, MD  amLODipine (NORVASC) 5 MG tablet Take 1 tablet (5 mg total) by mouth at bedtime. 04/15/17  Yes Tresa Garter, MD  aspirin 325 MG EC tablet Take 325 mg by mouth at bedtime.   Yes [provider]  atorvastatin (LIPITOR) 40 MG tablet Take 1 tablet (40 mg total) by mouth every evening. 04/15/17  Yes Tresa Garter, MD  Blood Glucose Monitoring Suppl (ACCU-CHEK AVIVA PLUS) w/Device KIT 1 each by Does not apply route 3 (three) times daily. 11/07/16  Yes Tresa Garter, MD  calcium carbonate (TUMS EX) 750 MG chewable tablet Chew 1 tablet by mouth 3 (three) times daily as needed for heartburn.   Yes [provider]  Colchicine 0.6 MG CAPS Take 1 tablet by mouth daily as needed 04/16/17  Yes Dareth Andrew E, MD  DULoxetine (CYMBALTA) 30 MG capsule Take 1 capsule (30 mg total) by mouth daily. 04/15/17  Yes Tresa Garter, MD  famotidine (PEPCID) 20 MG tablet Take 1 tablet (20 mg total) by mouth daily. 04/15/17  Yes  Tresa Garter, MD  furosemide (LASIX) 40 MG tablet Take 1 tablet (40 mg total) by mouth daily. 04/15/17  Yes Tresa Garter, MD  gabapentin (NEURONTIN) 300 MG capsule Take 300 mg by mouth daily after breakfast.   Yes [provider]  gabapentin (NEURONTIN) 600 MG tablet Take 600 mg by mouth at bedtime.   Yes [provider]  glucose blood (ACCU-CHEK AVIVA PLUS) test strip Use as instructed for 3 times daily testing of blood sugar. E11.9 02/05/17  Yes Tresa Garter, MD  hydrOXYzine (ATARAX/VISTARIL) 25 MG tablet Take 1 tablet (25 mg total) by mouth 3 (three) times daily as needed for anxiety. 04/15/17  Yes Tresa Garter, MD  ibuprofen (ADVIL,MOTRIN) 600 MG tablet Take 1 tablet (600 mg total) by mouth every 6 (six) hours as needed. Patient taking differently: Take 600 mg by mouth every 6 (six) hours as needed for moderate pain.  11/04/16  Yes Julianne Rice, MD  lisinopril (PRINIVIL,ZESTRIL) 40 MG tablet Take 1 tablet (40 mg total) by mouth daily. 04/15/17  Yes Tresa Garter, MD  metFORMIN (GLUCOPHAGE) 1000 MG tablet Take 0.5 tablets (500 mg total) by mouth 2 (two) times daily with a meal. 07/17/17  Yes Ignatz Deis E, MD  metoprolol tartrate (LOPRESSOR) 50 MG tablet Take 1 tablet (50 mg total) by mouth 2 (two) times daily. 04/15/17  Yes Tresa Garter, MD  PROVENTIL HFA 108 (90 Base) MCG/ACT inhaler INHALE 1-2 PUFFS INTO THE LUNGS EVERY 6 HOURS AS NEEDED FOR WHEEZING OR SHORTNESS OF BREATH 06/04/17  Yes Tresa Garter, MD    Objective:   Vitals:   07/17/17 1532  BP: 133/89  Pulse: 79  Temp: 98.5 F (36.9 C)  TempSrc: Oral  SpO2: 98%  Weight: 269 lb (122 kg)  Height: _0  (1.88 m)   Exam General appearance : Awake, alert, not in any distress. Speech Clear. Not toxic looking, obese HEENT: Atraumatic and Normocephalic, pupils equally reactive to light and accomodation Neck: Supple, no JVD. No cervical lymphadenopathy.  Chest: Good air entry bilaterally, no added sounds  CVS: S1 S2 regular, no murmurs.  Abdomen: Bowel sounds present, Non tender and not distended with no gaurding, rigidity or rebound. Extremities: B/L Lower Ext shows no edema, both legs are warm to touch Neurology: Awake alert, and oriented X 3, CN II-XII intact, Non focal Skin: No Rash  Data Review Lab Results  Component Value Date   HGBA1C 5.7 07/17/2017   HGBA1C 5.8 04/15/2017   HGBA1C 13.9 (H) 12/13/2016    Assessment & Plan   1. Type 2 diabetes mellitus treated without insulin (HCC)  - Glucose (CBG) - POCT glycosylated hemoglobin (Hb A1C) is now 5.7%   - Discontinue all Insulin  -  Reduce Metformin to 500 mg BiD - metFORMIN (GLUCOPHAGE) 1000 MG tablet; Take 0.5 tablets (500 mg total) by mouth 2 (two) times daily with a meal.  Dispense: 90 tablet; Refill: 3  2. Essential hypertension  We have discussed target BP range and blood pressure goal. I have advised patient to check BP regularly and to call us back or report to clinic if the numbers are consistently higher than 140/90. We discussed the importance of compliance with medical therapy and DASH diet recommended, consequences of uncontrolled hypertension discussed.  - continue current BP medications  3. Chronic pain syndrome  - Ambulatory referral to Physical Therapy  4. Generalized anxiety disorder - Continue current medications  5. Nicotine abuse  Preston [name] was  counseled on the dangers of tobacco use, and was advised to quit. Reviewed strategies to maximize success, including removing cigarettes and smoking materials from environment, stress management and support of family/friends.  6. Need for influenza vaccination  - Flu Vaccine QUAD 36+ mos IM  Patient have been counseled extensively about nutrition and exercise. Other issues discussed during this visit include: low cholesterol diet, weight control and daily exercise, foot care, annual eye examinations at Ophthalmology, importance of adherence with medications and regular follow-up. We also discussed long term complications of uncontrolled diabetes and hypertension.   Return in about 6 months (around 01/15/2018) for Follow up Pain and comorbidities, Hemoglobin A1C and Follow up, DM, Follow up HTN.  The patient was given clear instructions to go to ER or return to medical center if symptoms don't improve, worsen or new problems develop. The patient verbalized understanding. The patient was told to call to get lab results if they haven't heard anything in the next week.   This note has been created with Automotive engineer. Any transcriptional errors are unintentional.    Angelica Chessman, MD, MHA, Karilyn Cota, [facility] [place], [place]   07/17/2017, 4:38 PM

## 2017-07-19 ENCOUNTER — Encounter: Payer: Self-pay | Admitting: Pharmacist

## 2017-07-19 NOTE — Progress Notes (Signed)
PA approved for Tylenol #3 x 180 days. Approval [id number].

## 2017-09-13 DIAGNOSIS — I10 Essential (primary) hypertension: Secondary | ICD-10-CM

## 2017-10-27 ENCOUNTER — Other Ambulatory Visit: Payer: Self-pay | Admitting: Internal Medicine

## 2017-10-27 DIAGNOSIS — G4733 Obstructive sleep apnea (adult) (pediatric): Secondary | ICD-10-CM

## 2017-11-21 ENCOUNTER — Encounter: Payer: Self-pay | Admitting: Internal Medicine

## 2017-11-21 LAB — HM DIABETES EYE EXAM

## 2018-01-31 ENCOUNTER — Other Ambulatory Visit: Payer: Self-pay | Admitting: Internal Medicine

## 2018-01-31 DIAGNOSIS — G4733 Obstructive sleep apnea (adult) (pediatric): Secondary | ICD-10-CM

## 2018-02-24 ENCOUNTER — Other Ambulatory Visit: Payer: Self-pay | Admitting: Internal Medicine

## 2018-02-24 DIAGNOSIS — I10 Essential (primary) hypertension: Secondary | ICD-10-CM

## 2018-05-04 ENCOUNTER — Other Ambulatory Visit: Payer: Self-pay | Admitting: Internal Medicine

## 2018-05-04 DIAGNOSIS — I10 Essential (primary) hypertension: Secondary | ICD-10-CM

## 2018-05-04 DIAGNOSIS — M10072 Idiopathic gout, left ankle and foot: Secondary | ICD-10-CM

## 2018-05-04 DIAGNOSIS — K219 Gastro-esophageal reflux disease without esophagitis: Secondary | ICD-10-CM

## 2018-05-12 ENCOUNTER — Other Ambulatory Visit: Payer: Self-pay | Admitting: Internal Medicine

## 2018-05-12 DIAGNOSIS — K219 Gastro-esophageal reflux disease without esophagitis: Secondary | ICD-10-CM

## 2018-05-12 DIAGNOSIS — I10 Essential (primary) hypertension: Secondary | ICD-10-CM

## 2018-05-12 DIAGNOSIS — M10072 Idiopathic gout, left ankle and foot: Secondary | ICD-10-CM

## 2018-05-13 DIAGNOSIS — H5213 Myopia, bilateral: Secondary | ICD-10-CM | POA: Diagnosis not present

## 2018-05-20 ENCOUNTER — Ambulatory Visit: Payer: Medicaid Other | Attending: Nurse Practitioner | Admitting: Nurse Practitioner

## 2018-05-20 ENCOUNTER — Encounter: Payer: Self-pay | Admitting: Nurse Practitioner

## 2018-05-20 VITALS — BP 136/90 | HR 103 | Temp 98.5°F | Ht 74.0 in | Wt 278.0 lb

## 2018-05-20 DIAGNOSIS — K219 Gastro-esophageal reflux disease without esophagitis: Secondary | ICD-10-CM | POA: Diagnosis not present

## 2018-05-20 DIAGNOSIS — F411 Generalized anxiety disorder: Secondary | ICD-10-CM | POA: Insufficient documentation

## 2018-05-20 DIAGNOSIS — M1A9XX0 Chronic gout, unspecified, without tophus (tophi): Secondary | ICD-10-CM | POA: Insufficient documentation

## 2018-05-20 DIAGNOSIS — I10 Essential (primary) hypertension: Secondary | ICD-10-CM | POA: Diagnosis not present

## 2018-05-20 DIAGNOSIS — I5031 Acute diastolic (congestive) heart failure: Secondary | ICD-10-CM

## 2018-05-20 DIAGNOSIS — E782 Mixed hyperlipidemia: Secondary | ICD-10-CM | POA: Insufficient documentation

## 2018-05-20 DIAGNOSIS — J42 Unspecified chronic bronchitis: Secondary | ICD-10-CM

## 2018-05-20 DIAGNOSIS — Z96641 Presence of right artificial hip joint: Secondary | ICD-10-CM | POA: Diagnosis not present

## 2018-05-20 DIAGNOSIS — I110 Hypertensive heart disease with heart failure: Secondary | ICD-10-CM | POA: Diagnosis not present

## 2018-05-20 DIAGNOSIS — Z79899 Other long term (current) drug therapy: Secondary | ICD-10-CM | POA: Insufficient documentation

## 2018-05-20 DIAGNOSIS — Z888 Allergy status to other drugs, medicaments and biological substances status: Secondary | ICD-10-CM | POA: Insufficient documentation

## 2018-05-20 DIAGNOSIS — E119 Type 2 diabetes mellitus without complications: Secondary | ICD-10-CM | POA: Diagnosis not present

## 2018-05-20 DIAGNOSIS — Z794 Long term (current) use of insulin: Secondary | ICD-10-CM | POA: Insufficient documentation

## 2018-05-20 DIAGNOSIS — J449 Chronic obstructive pulmonary disease, unspecified: Secondary | ICD-10-CM | POA: Diagnosis not present

## 2018-05-20 DIAGNOSIS — G894 Chronic pain syndrome: Secondary | ICD-10-CM | POA: Diagnosis not present

## 2018-05-20 DIAGNOSIS — F17210 Nicotine dependence, cigarettes, uncomplicated: Secondary | ICD-10-CM | POA: Diagnosis not present

## 2018-05-20 DIAGNOSIS — F17200 Nicotine dependence, unspecified, uncomplicated: Secondary | ICD-10-CM

## 2018-05-20 LAB — POCT GLYCOSYLATED HEMOGLOBIN (HGB A1C): HEMOGLOBIN A1C: 6.3 % — AB (ref 4.0–5.6)

## 2018-05-20 LAB — GLUCOSE, POCT (MANUAL RESULT ENTRY): POC Glucose: 125 mg/dl — AB (ref 70–99)

## 2018-05-20 MED ORDER — ALLOPURINOL 300 MG PO TABS: 300.0000 mg | ORAL_TABLET | Freq: Every day | ORAL | 0 refills | Status: DC

## 2018-05-20 MED ORDER — DULOXETINE HCL 30 MG PO CPEP: 30.0000 mg | ORAL_CAPSULE | Freq: Every day | ORAL | 0 refills | Status: DC

## 2018-05-20 MED ORDER — FAMOTIDINE 20 MG PO TABS: 20.0000 mg | ORAL_TABLET | Freq: Every day | ORAL | 0 refills | Status: DC

## 2018-05-20 MED ORDER — FUROSEMIDE 40 MG PO TABS
40.0000 mg | ORAL_TABLET | Freq: Every day | ORAL | 0 refills | Status: DC
Start: 2018-05-20 — End: 2018-09-08

## 2018-05-20 MED ORDER — AMLODIPINE BESYLATE 5 MG PO TABS
5.0000 mg | ORAL_TABLET | Freq: Every day | ORAL | 0 refills | Status: DC
Start: 2018-05-20 — End: 2018-09-29

## 2018-05-20 MED ORDER — VARENICLINE TARTRATE 0.5 MG X 11 & 1 MG X 42 PO MISC: ORAL | 0 refills | Status: DC

## 2018-05-20 MED ORDER — METFORMIN HCL 1000 MG PO TABS: 500.0000 mg | ORAL_TABLET | Freq: Two times a day (BID) | ORAL | 0 refills | Status: DC

## 2018-05-20 MED ORDER — GABAPENTIN 300 MG PO CAPS: 600.0000 mg | ORAL_CAPSULE | Freq: Three times a day (TID) | ORAL | 3 refills | Status: DC

## 2018-05-20 MED ORDER — METOPROLOL TARTRATE 50 MG PO TABS: 50.0000 mg | ORAL_TABLET | Freq: Two times a day (BID) | ORAL | 0 refills | Status: DC

## 2018-05-20 MED ORDER — GLUCOSE BLOOD VI STRP: ORAL_STRIP | 2 refills | Status: DC

## 2018-05-20 MED ORDER — LISINOPRIL 40 MG PO TABS: 40.0000 mg | ORAL_TABLET | Freq: Every day | ORAL | 0 refills | Status: DC

## 2018-05-20 MED ORDER — HYDROXYZINE HCL 50 MG PO TABS: 50.0000 mg | ORAL_TABLET | Freq: Three times a day (TID) | ORAL | 3 refills | Status: AC | PRN

## 2018-05-20 MED ORDER — ATORVASTATIN CALCIUM 40 MG PO TABS: 40.0000 mg | ORAL_TABLET | Freq: Every evening | ORAL | 0 refills | Status: DC

## 2018-05-20 MED ORDER — COLCHICINE 0.6 MG PO CAPS: ORAL_CAPSULE | ORAL | 3 refills | Status: DC

## 2018-05-20 MED ORDER — ALBUTEROL SULFATE HFA 108 (90 BASE) MCG/ACT IN AERS: 1.0000 | INHALATION_SPRAY | Freq: Four times a day (QID) | RESPIRATORY_TRACT | 1 refills | Status: DC | PRN

## 2018-05-20 NOTE — Progress Notes (Signed)
Assessment & Plan:  Preston [name] was seen today for establish care and referral.  Diagnoses and all orders for this visit:  Type 2 diabetes mellitus treated without insulin (HCC) -     Glucose (CBG) -     HgB A1c -     gabapentin (NEURONTIN) 300 MG capsule; Take 2 capsules (600 mg total) by mouth 3 (three) times daily. -     MR Lumbar Spine Wo Contrast; Future -     ECHOCARDIOGRAM COMPLETE; Future -     glucose blood (ACCU-CHEK AVIVA PLUS) test strip; Use as instructed for 3 times daily testing of blood sugar. E11.9 -     metFORMIN (GLUCOPHAGE) 1000 MG tablet; Take 0.5 tablets (500 mg total) by mouth 2 (two) times daily with a meal. -     Ambulatory referral to Dermatology -     Ambulatory referral to Pulmonology  Essential hypertension -     CBC -     CMP14+EGFR -     lisinopril (PRINIVIL,ZESTRIL) 40 MG tablet; Take 1 tablet (40 mg total) by mouth daily. -     metoprolol tartrate (LOPRESSOR) 50 MG tablet; Take 1 tablet (50 mg total) by mouth 2 (two) times daily. Continue all antihypertensives as prescribed.  Remember to bring in your blood pressure log with you for your follow up appointment.  DASH/Mediterranean Diets are healthier choices for HTN.    Mixed hyperlipidemia -     Lipid panel -     amLODipine (NORVASC) 5 MG tablet; Take 1 tablet (5 mg total) by mouth at bedtime. MUST MAKE APPT FOR FURTHER REFILLS -     atorvastatin (LIPITOR) 40 MG tablet; Take 1 tablet (40 mg total) by mouth every evening. INSTRUCTIONS: Work on a low fat, heart healthy diet and participate in regular aerobic exercise program by working out at least 150 minutes per week; 5 days a week-30 minutes per day. Avoid red meat, fried foods. junk foods, sodas, sugary drinks, unhealthy snacking, alcohol and smoking.  Drink at least 48oz of water per day and monitor your carbohydrate intake daily.    Chronic gout without tophus, unspecified cause, unspecified site -     Uric Acid -     allopurinol (ZYLOPRIM) 300  MG tablet; Take 1 tablet (300 mg total) by mouth at bedtime. -     Colchicine 0.6 MG CAPS; Take 1 tablet by mouth daily as needed  Generalized anxiety disorder Chronic and well controlled.  -     hydrOXYzine (ATARAX/VISTARIL) 50 MG tablet; Take 1 tablet (50 mg total) by mouth 3 (three) times daily as needed for anxiety. -     DULoxetine (CYMBALTA) 30 MG capsule; Take 1 capsule (30 mg total) by mouth daily. GAD 7 : Generalized Anxiety Score 05/20/2018 04/15/2017 01/02/2017 12/19/2016  Nervous, Anxious, on Edge 0 0 0 0  Control/stop worrying 0 0 0 0  Worry too much - different things 0 0 0 0  Trouble relaxing 0 0 0 0  Restless 0 0 0 0  Easily annoyed or irritable 0 0 0 0  Afraid - awful might happen 0 0 0 0  Total GAD 7 Score 0 0 0 0     COPD  -     albuterol (PROVENTIL HFA) 108 (90 Base) MCG/ACT inhaler; Inhale 1-2 puffs into the lungs every 6 (six) hours as needed for wheezing or shortness of breath (cough).   Acute diastolic congestive heart failure (HCC) -     furosemide (LASIX)  40 MG tablet; Take 1 tablet (40 mg total) by mouth daily. DASH/Mediterranean Diets are healthier choices for CHF  Gastroesophageal reflux disease without esophagitis Chronic and stable.  -     famotidine (PEPCID) 20 MG tablet; Take 1 tablet (20 mg total) by mouth daily. INSTRUCTIONS: Avoid GERD Triggers: acidic, spicy or fried foods, caffeine, coffee, sodas,  alcohol and chocolate.   Chronic pain syndrome -     DULoxetine (CYMBALTA) 30 MG capsule; Take 1 capsule (30 mg total) by mouth daily.  Presence of right artificial hip joint -     MR HIP RIGHT WO CONTRAST; Future  Tobacco dependence -     varenicline (CHANTIX STARTING MONTH PAK) 0.5 MG X 11 & 1 MG X 42 tablet; Take one 0.5 mg tab by mouth once daily x 3 days, increase to one 0.5 mg tablet twice daily x 4 days, then increase to 1 mg tablet 2x a day 1. Preston [name] continues to smoke several cigarettes per day. 2. Preston [name] was counseled on the dangers of  tobacco use, and was advised to quit. We reviewed specific strategies to maximize success, including removing cigarettes and smoking materials from environment, stress management and support of family/friends as well as pharmacological alternatives. 3. A total of 5 minutes was spent on counseling for smoking cessation and Preston [name] is ready to quit and has chosen Chantix to start today.  4. Preston [name] was offered Wellbutrin, Chantix, Nicotine patch, Nicotine gum or lozenges.  Due to out of pocket costs Preston [name] was also given smoking cessation support and advised to contact: the Smoking Cessation hotline: 1-800-QUIT-NOW.  Preston [name] was also informed of our Smoking cessation classes which are also available through [facility] by calling [phone] or visit our website at [email or web address]  5. Will follow up at next scheduled office visit.      Patient has been counseled on age-appropriate routine health concerns for screening and prevention. These are reviewed and up-to-date. Referrals have been placed accordingly. Immunizations are up-to-date or declined.    Subjective:   Chief Complaint  Patient presents with  . Establish Care    Pt. is here to establish care for medication refills.   . Referral    Pt is requesting cardiology referral.    HPI Preston [name] 47 y.o. male presents to office today to establish care. He has not been seen in this office in over 10 months. He has a history of gout, DM II, HTN, HPL, CHF (left ventricular diastolic dysfunction last EF 55-60% on  05-07-2016) and asthma with re occurring bronchitis (PFT 10-23-2015 only showed mild obstruction; Albuterol prn only per pulmonology at that time). 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 He will need to be referred to cardiology and pulmonology for further evaluation. He is uninsured. Patient has been advised to apply for  financial assistance and schedule to see our financial counselor.     DM Type 2 Last A1c in December 2018 was 5.7. Medications include metformin 500 mg BID. Diabetes complications include peripheral neuropathy. He is currently out of off his medications and supplies. Has not been monitoring his blood glucose levels. A1c up to 6.3. He is also overdue for eye exam. He is not diet or exercise compliant.  Lab Results  Component Value Date   HGBA1C 6.3 (A) 05/20/2018   Lab Results  Component Value Date   HGBA1C 5.7 07/17/2017   CHRONIC HYPERTENSION  Disease Monitoring  Blood pressure range BP Readings from Last 3 Encounters:  05/20/18 136/90  07/17/17 133/89  04/24/17 125/82  Blood pressure well controlled.   Chest pain: no   Dyspnea: yes   Claudication: no  Medication compliance: yes; lisinopril 42m daily, metoprolol 50 mg BID and amlodipine 563mdaily Medication Side Effects  Lightheadedness: no   Urinary frequency: no   Edema: yes   Impotence: no  Preventitive Healthcare:  Exercise: no   Diet Pattern: diet: general  Salt Restriction:  No  Hyperlipidemia Patient presents for follow up to hyperlipidemia.  He is not medication compliant. He is not diet compliant and denies chest pain, exertional chest pressure/discomfort, poor exercise tolerance and skin xanthelasma or statin intolerance including myalgias.  Lab Results  Component Value Date   CHOL 125 05/20/2018   Lab Results  Component Value Date   HDL 39 (L) 05/20/2018   Lab Results  Component Value Date   LDLCALC 42 05/20/2018   Lab Results  Component Value Date   TRIG 221 (H) 05/20/2018   Lab Results  Component Value Date   CHOLHDL 3.2 05/20/2018   Chronic Pain Syndrome He has chronic pain in his bilateral hips R>L and lower back which he takes gabapentin and cymbalta. Endorses pain as only minmally controlled with 300 mg Gabapentin TID and cymbalta 3066maily.   Review of Systems  Constitutional:  Negative for fever, malaise/fatigue and weight loss.  HENT: Negative.  Negative for nosebleeds.   Eyes: Negative.  Negative for blurred vision, double vision and photophobia.  Respiratory: Positive for cough (chronic; using albuterol inhaler frequently). Negative for shortness of breath.   Cardiovascular: Positive for leg swelling. Negative for chest pain, palpitations and claudication.  Gastrointestinal: Positive for heartburn. Negative for nausea and vomiting.  Musculoskeletal: Positive for back pain and joint pain. Negative for myalgias.  Neurological: Negative.  Negative for dizziness, focal weakness, seizures and headaches.  Psychiatric/Behavioral: Negative for suicidal ideas. The patient is nervous/anxious.     Past Medical History:  Diagnosis Date  . Anxiety   . Asthma    daily inhaler  . [place]  [place]  . Chronic cough    states due to COPD  . Chronic lower back pain   . COPD (chronic obstructive pulmonary disease) (HCC)    no home O2  . Dyspnea    with humidity and high pollen count  . Edentulous    upper gum  . GERD (gastroesophageal reflux disease)   . Gout   . History of Clostridium difficile infection 2014  . Hypercholesterolemia   . Hypertension    states under control with med., has been on med. x 4 yr.  . [place] Oste[place]    bilat. hip  . Panic attacks   . Shoulder pain, right 04/18/2017    Past Surgical History:  Procedure Laterality Date  . COLON SURGERY     14 inches of colon removed  . COLONOSCOPY WITH PROPOFOL N/A 04/01/2013   Procedure: COLONOSCOPY WITH PROPOFOL;  Surgeon: WilArta SilenceD;  Location: [facility];  Service: Endoscopy;  Laterality: N/A;  . FLEXIBLE SIGMOIDOSCOPY N/A 02/28/2013   Procedure: FLEXIBLE SIGMOIDOSCOPY;  Surgeon: JamWinfield CunasMD;  [facility];  Service: Endoscopy;  Laterality: N/A;  . LAPAROSCOPIC APPENDECTOMY   07/05/2009  . LAPAROSCOPIC LOW ANTERIOR RESECTION N/A 07/08/2013   Procedure: LAPAROSCOPIC LOW ANTERIOR RESECTION WITH TAKEDOWN OF SPLENIC FLEXURE.;  Surgeon: Adin Hector, MD;  Location: [facility];  Service: General;  Laterality: N/A;  . MANDIBLE SURGERY Left 2010   "dry skin pocket cut out"  . MULTIPLE EXTRACTIONS WITH ALVEOLOPLASTY N/A 04/24/2017   Procedure: SURGICAL REMOVAL OF TEETH NUMBERS `7 20-29 AND 31;  Surgeon: Michael Litter, DMD;  Location: [place];  Service: Oral Surgery;  Laterality: N/A;  . TOTAL HIP ARTHROPLASTY Left 06/25/2016   Procedure: TOTAL HIP ARTHROPLASTY ANTERIOR APPROACH;  Surgeon: Frederik Pear, MD;  Location: [facility];  Service: Orthopedics;  Laterality: Left;  . TOTAL HIP ARTHROPLASTY Right 09/10/2016   Procedure: TOTAL HIP ARTHROPLASTY ANTERIOR APPROACH;  Surgeon: Frederik Pear, MD;  Location: [place];  Service: Orthopedics;  Laterality: Right;    Family History  Problem Relation Age of Onset  . Cancer Father        lymphoma  . Heart attack Mother     Social History Reviewed with no changes to be made today.   Outpatient Medications Prior to Visit  Medication Sig Dispense Refill  . aspirin 325 MG EC tablet Take 325 mg by mouth at bedtime.    . Blood Glucose Monitoring Suppl (ACCU-CHEK AVIVA PLUS) w/Device KIT 1 each by Does not apply route 3 (three) times daily. 1 kit 0  . calcium carbonate (TUMS EX) 750 MG chewable tablet Chew 1 tablet by mouth 3 (three) times daily as needed for heartburn.    [name] acetaminophen-codeine (TYLENOL #3) 300-30 MG tablet Take 1 tablet by mouth every 4 (four) hours as needed. 90 tablet 0  . allopurinol (ZYLOPRIM) 300 MG tablet Take 1 tablet (300 mg total) by mouth at bedtime. 90 tablet 3  . amLODipine (NORVASC) 5 MG tablet Take 1 tablet (5 mg total) by mouth at bedtime. MUST MAKE APPT FOR FURTHER REFILLS 30 tablet 0  . atorvastatin (LIPITOR) 40 MG tablet Take 1 tablet (40 mg total) by mouth every evening. 90 tablet 3  .  Colchicine 0.6 MG CAPS Take 1 tablet by mouth daily as needed 30 capsule 3  . DULoxetine (CYMBALTA) 30 MG capsule Take 1 capsule (30 mg total) by mouth daily. 90 capsule 3  . famotidine (PEPCID) 20 MG tablet Take 1 tablet (20 mg total) by mouth daily. 90 tablet 3  . furosemide (LASIX) 40 MG tablet Take 1 tablet (40 mg total) by mouth daily. 90 tablet 3  . lisinopril (PRINIVIL,ZESTRIL) 40 MG tablet Take 1 tablet (40 mg total) by mouth daily. 90 tablet 3  . metFORMIN (GLUCOPHAGE) 1000 MG tablet Take 0.5 tablets (500 mg total) by mouth 2 (two) times daily with a meal. 90 tablet 3  . metoprolol tartrate (LOPRESSOR) 50 MG tablet Take 1 tablet (50 mg total) by mouth 2 (two) times daily. 180 tablet 3  . PROVENTIL HFA 108 (90 Base) MCG/ACT inhaler INHALE 1 TO 2 PUFFS BY MOUTH EVERY 6 HOURS AS NEEDED FOR  WHEEZING  OR  SHORTNESS  OF  BREATH 7 each 1  . ibuprofen (ADVIL,MOTRIN) 600 MG tablet Take 1 tablet (600 mg total) by mouth every 6 (six) hours as needed. (Patient not taking: Reported on 05/20/2018) 30 tablet 0  . gabapentin (NEURONTIN) 300 MG capsule Take 300 mg by mouth daily after breakfast.    . gabapentin (NEURONTIN) 600 MG tablet Take 600 mg by mouth at bedtime.    [name] glucose blood (ACCU-CHEK AVIVA PLUS) test strip Use as instructed for 3 times daily testing of blood sugar. E11.9 (Patient not taking: Reported on  05/20/2018) 300 each 2  . hydrOXYzine (ATARAX/VISTARIL) 25 MG tablet Take 1 tablet (25 mg total) by mouth 3 (three) times daily as needed for anxiety. (Patient not taking: Reported on 05/20/2018) 90 tablet 3   No facility-administered medications prior to visit.     Allergies  Allergen Reactions  . Morphine And Related Itching and Nausea And Vomiting  . Adhesive [Tape] Itching and Rash       Objective:    BP 136/90 (BP Location: Right Arm, Patient Position: Sitting, Cuff Size: Large)   Pulse (!) 103   Temp 98.5 F (36.9 C) (Oral)   Ht _0  (1.88 m)   Wt 278 lb (126.1 kg)    SpO2 97%   BMI 35.69 kg/m  Wt Readings from Last 3 Encounters:  05/20/18 278 lb (126.1 kg)  07/17/17 269 lb (122 kg)  04/24/17 271 lb (122.9 kg)    Physical Exam  Constitutional: He is oriented to person, place, and time. He appears well-developed and well-nourished. He is cooperative.  HENT:  Head: Normocephalic and atraumatic.  Eyes: EOM are normal.  Neck: Normal range of motion.  Cardiovascular: Regular rhythm, normal heart sounds and intact distal pulses. Tachycardia present. Exam reveals no gallop and no friction rub.  No murmur heard. Pulmonary/Chest: Effort normal and breath sounds normal. No tachypnea. No respiratory distress. He has no decreased breath sounds. He has no wheezes. He has no rhonchi. He has no rales. He exhibits no tenderness.  Abdominal: Soft. Bowel sounds are normal.  Musculoskeletal: He exhibits tenderness. He exhibits no edema or deformity.       Right hip: He exhibits decreased range of motion. He exhibits no bony tenderness and no swelling.       Left hip: He exhibits normal range of motion, no tenderness and no bony tenderness.  Neurological: He is alert and oriented to person, place, and time. Coordination normal.  Skin: Skin is warm and dry.  Psychiatric: He has a normal mood and affect. His behavior is normal. Judgment and thought content normal.  Nursing note and vitals reviewed.     Patient has been counseled extensively about nutrition and exercise as well as the importance of adherence with medications and regular follow-up. The patient was given clear instructions to go to ER or return to medical center if symptoms don't improve, worsen or new problems develop. The patient verbalized understanding.   Follow-up: f/u 6 weeks  Gildardo Pounds, FNP-BC [facility] and [facility], [place]   05/23/2018, 2:17 PM

## 2018-05-21 LAB — CBC
HEMATOCRIT: 46.6 % (ref 37.5–51.0)
Hemoglobin: 16.1 g/dL (ref 13.0–17.7)
MCH: 31.3 pg (ref 26.6–33.0)
MCHC: 34.5 g/dL (ref 31.5–35.7)
MCV: 91 fL (ref 79–97)
PLATELETS: 361 10*3/uL (ref 150–450)
RBC: 5.15 x10E6/uL (ref 4.14–5.80)
RDW: 12.1 % — AB (ref 12.3–15.4)
WBC: 13 10*3/uL — ABNORMAL HIGH (ref 3.4–10.8)

## 2018-05-21 LAB — LIPID PANEL
CHOL/HDL RATIO: 3.2 ratio (ref 0.0–5.0)
Cholesterol, Total: 125 mg/dL (ref 100–199)
HDL: 39 mg/dL — AB (ref >39)
LDL Calculated: 42 mg/dL (ref 0–99)
Triglycerides: 221 mg/dL — ABNORMAL HIGH (ref 0–149)
VLDL Cholesterol Cal: 44 mg/dL — ABNORMAL HIGH (ref 5–40)

## 2018-05-21 LAB — CMP14+EGFR
A/G RATIO: 2.1 (ref 1.2–2.2)
ALT: 65 IU/L — AB (ref 0–44)
AST: 26 IU/L (ref 0–40)
Albumin: 4.8 g/dL (ref 3.5–5.5)
Alkaline Phosphatase: 71 IU/L (ref 39–117)
BUN/Creatinine Ratio: 7 — ABNORMAL LOW (ref 9–20)
BUN: 6 mg/dL (ref 6–24)
CHLORIDE: 94 mmol/L — AB (ref 96–106)
CO2: 20 mmol/L (ref 20–29)
Calcium: 10.5 mg/dL — ABNORMAL HIGH (ref 8.7–10.2)
Creatinine, Ser: 0.83 mg/dL (ref 0.76–1.27)
GFR, EST AFRICAN AMERICAN: 122 mL/min/{1.73_m2} (ref >59)
GFR, EST NON AFRICAN AMERICAN: 106 mL/min/{1.73_m2} (ref >59)
GLOBULIN, TOTAL: 2.3 g/dL (ref 1.5–4.5)
Glucose: 95 mg/dL (ref 65–99)
POTASSIUM: 4.7 mmol/L (ref 3.5–5.2)
SODIUM: 136 mmol/L (ref 134–144)
TOTAL PROTEIN: 7.1 g/dL (ref 6.0–8.5)

## 2018-05-21 LAB — URIC ACID: URIC ACID: 7.6 mg/dL (ref 3.7–8.6)

## 2018-05-23 ENCOUNTER — Encounter: Payer: Self-pay | Admitting: Nurse Practitioner

## 2018-05-23 ENCOUNTER — Ambulatory Visit: Payer: Medicaid Other | Admitting: Critical Care Medicine

## 2018-05-27 ENCOUNTER — Telehealth: Payer: Self-pay

## 2018-05-27 NOTE — Telephone Encounter (Signed)
CMA spoke to patient's emergency contact whom is on his DPR.  Pt's was inform on results.  Patient asked if PCP can prescribed a one time for anti-anxiety for his MRI that is scheduled on 06/03/2018.

## 2018-05-27 NOTE — Telephone Encounter (Signed)
-----   Message from Gildardo Pounds, NP sent at 05/23/2018  2:22 PM EDT ----- Cholesterol levels specifically your triglycerides are elevated. Make sure you are taking your cholesterol medication as prescribed. Kidney and liver function are fairly normal. Uric acid level for gout is elevated. Make sure are taking your allopurinol and avoiding foods high in uric acid.

## 2018-05-29 DIAGNOSIS — Z86010 Personal history of colonic polyps: Secondary | ICD-10-CM | POA: Diagnosis not present

## 2018-05-29 DIAGNOSIS — K5792 Diverticulitis of intestine, part unspecified, without perforation or abscess without bleeding: Secondary | ICD-10-CM | POA: Diagnosis not present

## 2018-05-30 ENCOUNTER — Other Ambulatory Visit (HOSPITAL_COMMUNITY): Payer: Medicaid Other

## 2018-06-03 ENCOUNTER — Ambulatory Visit (HOSPITAL_COMMUNITY): Payer: Medicaid Other

## 2018-06-03 ENCOUNTER — Ambulatory Visit (HOSPITAL_COMMUNITY)
Admission: RE | Admit: 2018-06-03 | Discharge: 2018-06-03 | Disposition: A | Discharge status: Home / Self Care | Payer: Medicaid Other | Source: Ambulatory Visit | Attending: Nurse Practitioner | Admitting: Nurse Practitioner

## 2018-06-03 DIAGNOSIS — E119 Type 2 diabetes mellitus without complications: Secondary | ICD-10-CM | POA: Insufficient documentation

## 2018-06-03 DIAGNOSIS — Z720 Tobacco use: Secondary | ICD-10-CM | POA: Diagnosis not present

## 2018-06-03 DIAGNOSIS — E785 Hyperlipidemia, unspecified: Secondary | ICD-10-CM | POA: Insufficient documentation

## 2018-06-03 DIAGNOSIS — J449 Chronic obstructive pulmonary disease, unspecified: Secondary | ICD-10-CM | POA: Insufficient documentation

## 2018-06-03 DIAGNOSIS — I509 Heart failure, unspecified: Secondary | ICD-10-CM | POA: Insufficient documentation

## 2018-06-03 DIAGNOSIS — I110 Hypertensive heart disease with heart failure: Secondary | ICD-10-CM | POA: Insufficient documentation

## 2018-06-03 NOTE — Progress Notes (Signed)
  Echocardiogram 2D Echocardiogram has been performed.  Preston [name] 06/03/2018, 1:31 PM

## 2018-06-04 ENCOUNTER — Other Ambulatory Visit: Payer: Self-pay | Admitting: Nurse Practitioner

## 2018-06-04 DIAGNOSIS — I519 Heart disease, unspecified: Secondary | ICD-10-CM

## 2018-06-04 DIAGNOSIS — I5189 Other ill-defined heart diseases: Secondary | ICD-10-CM

## 2018-06-04 DIAGNOSIS — M25551 Pain in right hip: Principal | ICD-10-CM

## 2018-06-04 DIAGNOSIS — M5442 Lumbago with sciatica, left side: Secondary | ICD-10-CM

## 2018-06-04 DIAGNOSIS — G8929 Other chronic pain: Secondary | ICD-10-CM

## 2018-06-04 DIAGNOSIS — M5441 Lumbago with sciatica, right side: Secondary | ICD-10-CM

## 2018-06-04 NOTE — Telephone Encounter (Addendum)
Patient prior authorization for MRI Right W/O Contrast and MRI lumbar spine W/O Contrast were both denied.   Peer To Peer with physician is allow: [phone].

## 2018-06-04 NOTE — Progress Notes (Signed)
MRI not approved. Patient will need to have xrays completed initially. He has been informed.

## 2018-06-04 NOTE — Telephone Encounter (Signed)
Please call and let him know xrays have been ordered

## 2018-06-05 ENCOUNTER — Telehealth: Payer: Self-pay | Admitting: Nurse Practitioner

## 2018-06-05 NOTE — Telephone Encounter (Signed)
Per-service center wants to know are you going to complete a peer to peer. Please call them back so they know what to do with the appointment on the 14th the number to Brandie is [phone] ext [phone]

## 2018-06-06 ENCOUNTER — Telehealth: Payer: Self-pay

## 2018-06-06 NOTE — Telephone Encounter (Signed)
Per provider, he need to to get the Xrays imaging first . I left a VM for Brandie and inform her also.

## 2018-06-06 NOTE — Telephone Encounter (Signed)
CMA spoke to [place] and inform on results.

## 2018-06-06 NOTE — Telephone Encounter (Signed)
CMA attempt to call Brandie and inform MRI was not approve by Medicaid. Pt. Will get the Xrays done first.  No answer and left a VM for Brandie.

## 2018-06-06 NOTE — Telephone Encounter (Signed)
-----   Message from Gildardo Pounds, NP sent at 06/04/2018  9:32 PM EST ----- Echocardiogram only showing mild chf. I have referred you to cardiology however at this time you are on appropriate treatment. Also your MRIs were not approved. Will need xrays first and then mri based on findings

## 2018-06-06 NOTE — Telephone Encounter (Signed)
Did you call the per service center to inform them about the peer to peer

## 2018-06-06 NOTE — Telephone Encounter (Signed)
CMA spoke to Preston [name] who is on the [facility], regarding the Xrays that has been ordered. She understood is aware of Xrays.

## 2018-06-12 ENCOUNTER — Ambulatory Visit (HOSPITAL_COMMUNITY): Payer: Medicaid Other

## 2018-06-24 ENCOUNTER — Other Ambulatory Visit: Payer: Self-pay

## 2018-06-24 MED ORDER — VARENICLINE TARTRATE 1 MG PO TABS: 1.0000 mg | ORAL_TABLET | Freq: Two times a day (BID) | ORAL | 0 refills | Status: DC

## 2018-07-01 ENCOUNTER — Ambulatory Visit: Payer: Medicaid Other | Admitting: Nurse Practitioner

## 2018-07-08 ENCOUNTER — Ambulatory Visit (HOSPITAL_COMMUNITY)
Admission: RE | Admit: 2018-07-08 | Discharge: 2018-07-08 | Disposition: A | Discharge status: Home / Self Care | Payer: Medicaid Other | Source: Ambulatory Visit | Attending: Nurse Practitioner | Admitting: Nurse Practitioner

## 2018-07-08 DIAGNOSIS — M47816 Spondylosis without myelopathy or radiculopathy, lumbar region: Secondary | ICD-10-CM | POA: Diagnosis not present

## 2018-07-08 DIAGNOSIS — G8929 Other chronic pain: Secondary | ICD-10-CM | POA: Diagnosis not present

## 2018-07-08 DIAGNOSIS — M1611 Unilateral primary osteoarthritis, right hip: Secondary | ICD-10-CM | POA: Diagnosis not present

## 2018-07-08 DIAGNOSIS — D485 Neoplasm of uncertain behavior of skin: Secondary | ICD-10-CM | POA: Diagnosis not present

## 2018-07-08 DIAGNOSIS — H524 Presbyopia: Secondary | ICD-10-CM | POA: Diagnosis not present

## 2018-07-08 DIAGNOSIS — M25551 Pain in right hip: Secondary | ICD-10-CM | POA: Diagnosis not present

## 2018-07-08 DIAGNOSIS — M5441 Lumbago with sciatica, right side: Secondary | ICD-10-CM | POA: Diagnosis not present

## 2018-07-08 DIAGNOSIS — M5442 Lumbago with sciatica, left side: Secondary | ICD-10-CM | POA: Diagnosis not present

## 2018-07-09 ENCOUNTER — Other Ambulatory Visit: Payer: Self-pay | Admitting: Nurse Practitioner

## 2018-07-09 DIAGNOSIS — M150 Primary generalized (osteo)arthritis: Principal | ICD-10-CM

## 2018-07-09 DIAGNOSIS — M159 Polyosteoarthritis, unspecified: Secondary | ICD-10-CM

## 2018-07-11 DIAGNOSIS — L821 Other seborrheic keratosis: Secondary | ICD-10-CM | POA: Diagnosis not present

## 2018-07-15 ENCOUNTER — Ambulatory Visit: Payer: Medicaid Other | Admitting: Cardiology

## 2018-07-15 DIAGNOSIS — D123 Benign neoplasm of transverse colon: Secondary | ICD-10-CM | POA: Diagnosis not present

## 2018-07-15 DIAGNOSIS — K648 Other hemorrhoids: Secondary | ICD-10-CM | POA: Diagnosis not present

## 2018-07-15 DIAGNOSIS — Z86010 Personal history of colonic polyps: Secondary | ICD-10-CM | POA: Diagnosis not present

## 2018-07-15 DIAGNOSIS — D122 Benign neoplasm of ascending colon: Secondary | ICD-10-CM | POA: Diagnosis not present

## 2018-07-29 ENCOUNTER — Encounter: Payer: Self-pay | Admitting: Physical Therapy

## 2018-07-29 ENCOUNTER — Other Ambulatory Visit: Payer: Self-pay

## 2018-07-29 ENCOUNTER — Ambulatory Visit: Payer: Medicaid Other | Attending: Nurse Practitioner | Admitting: Physical Therapy

## 2018-07-29 DIAGNOSIS — M25552 Pain in left hip: Secondary | ICD-10-CM | POA: Insufficient documentation

## 2018-07-29 DIAGNOSIS — M545 Low back pain, unspecified: Secondary | ICD-10-CM

## 2018-07-29 DIAGNOSIS — R262 Difficulty in walking, not elsewhere classified: Secondary | ICD-10-CM | POA: Diagnosis not present

## 2018-07-29 DIAGNOSIS — M25551 Pain in right hip: Secondary | ICD-10-CM | POA: Diagnosis not present

## 2018-07-29 DIAGNOSIS — G8929 Other chronic pain: Secondary | ICD-10-CM

## 2018-07-29 DIAGNOSIS — M6281 Muscle weakness (generalized): Secondary | ICD-10-CM | POA: Diagnosis not present

## 2018-07-29 NOTE — Therapy (Signed)
[place], [place], [address] Phone: [phone]   Fax:  [phone]  Physical Therapy Evaluation  Patient Details  Name: Preston [name] MRN: [id number] Date of Birth: 03/24/1971 Referring Provider (PT): Geryl Rankins, NP   Encounter Date: 07/29/2018  PT End of Session - 07/29/18 1326    Visit Number  1    Number of Visits  4    Date for PT Re-Evaluation  09/02/18    Authorization Type  Medicaid    PT Start Time  1148    PT Stop Time  1227    PT Time Calculation (min)  39 min    Activity Tolerance  Patient tolerated treatment well    Behavior During Therapy  [facility] for tasks assessed/performed       Past Medical History:  Diagnosis Date  . Anxiety   . Asthma    daily inhaler  . Cancer ([place])    pt. reports diagnosed with skin cancer on face/uncertain type but he reports may be basal cell, pending surgery to remove 09/03/18  . CHF (congestive heart failure) ([place])    no cardiologist  . Chronic cough    states due to COPD  . Chronic lower back pain   . COPD (chronic obstructive pulmonary disease) ([facility])    no home O2  . Dyspnea    with humidity and high pollen count  . Edentulous    upper gum  . GERD (gastroesophageal reflux disease)   . Gout   . History of Clostridium difficile infection 2014  . Hypercholesterolemia   . Hypertension    states under control with med., has been on med. x 4 yr.  . Insulin dependent diabetes mellitus ([place])   . Osteoarthritis    bilat. hip  . Panic attacks   . Shoulder pain, right 04/18/2017    Past Surgical History:  Procedure Laterality Date  . COLON SURGERY     14 inches of colon removed  . COLONOSCOPY WITH PROPOFOL N/A 04/01/2013   Procedure: COLONOSCOPY WITH PROPOFOL;  Surgeon: Arta Silence, MD;  Location: [facility] ENDOSCOPY;  Service: Endoscopy;  Laterality: N/A;  . FLEXIBLE SIGMOIDOSCOPY N/A 02/28/2013   Procedure: FLEXIBLE SIGMOIDOSCOPY;  Surgeon: Winfield Cunas., MD;  Location: [facility] ENDOSCOPY;  Service: Endoscopy;  Laterality: N/A;  . LAPAROSCOPIC APPENDECTOMY  07/05/2009  . LAPAROSCOPIC LOW ANTERIOR RESECTION N/A 07/08/2013   Procedure: LAPAROSCOPIC LOW ANTERIOR RESECTION WITH TAKEDOWN OF SPLENIC FLEXURE.;  Surgeon: Adin Hector, MD;  Location: [place];  Service: General;  Laterality: N/A;  . MANDIBLE SURGERY Left 2010   "dry skin pocket cut out"  . MULTIPLE EXTRACTIONS WITH ALVEOLOPLASTY N/A 04/24/2017   Procedure: SURGICAL REMOVAL OF TEETH NUMBERS `7 20-29 AND 31;  Surgeon: Michael Litter, DMD;  Location: [place];  Service: Oral Surgery;  Laterality: N/A;  . TOTAL HIP ARTHROPLASTY Left 06/25/2016   Procedure: TOTAL HIP ARTHROPLASTY ANTERIOR APPROACH;  Surgeon: Frederik Pear, MD;  Location: [place];  Service: Orthopedics;  Laterality: Left;  . TOTAL HIP ARTHROPLASTY Right 09/10/2016   Procedure: TOTAL HIP ARTHROPLASTY ANTERIOR APPROACH;  Surgeon: Frederik Pear, MD;  Location: [place];  Service: Orthopedics;  Laterality: Right;    There were no vitals filed for this visit.   Subjective Assessment - 07/29/18 1314    Subjective  Pt. is a 47 y/o male referred to PT with c/o chronic LBP as well as bilat. hip pain. He reports approximately 10-11 year insidious  onset symptoms including lumbar and anterior hip pain. Pt. underwent TKA for right hip 09/10/16 and left hip 06/25/16 (both anterior approach) but reports this did not help with pain. Per pt. report X-rays taken by surgeon did not show abnormality. Pt. also diagnosed with lumbar DJD. Pain for this region includes local lumbar pain as well as intermittent radiating into legs distal to knees    Pertinent History  Bilat. anterior TKA, chronic pain history, diabetes with neuropathy-impaired LE sensation, gout    Limitations  Sitting;Standing;Walking;House hold activities;Lifting    How long can you sit comfortably?  45 minutes    How long can you stand comfortably?  8-10 minutes    How  long can you walk comfortably?  40-50 yards, less than 5 minutes    Diagnostic tests  X-rays    Patient Stated Goals  Get better    Currently in Pain?  Yes    Pain Score  8     Pain Location  Back    Pain Orientation  Lower;Mid    Pain Descriptors / Indicators  Shooting;Burning    Pain Type  Chronic pain    Pain Radiating Towards  intermittent pain distally to both knees    Pain Onset  More than a month ago    Pain Frequency  Constant    Aggravating Factors   standing, walking, sitting (standing and walking worse than sitting)    Pain Relieving Factors  no eases noted    Effect of Pain on Daily Activities  limits tolerance standing and ambulation for ADLs, difficulty with lifting activities    Multiple Pain Sites  Yes    Pain Score  7    Pain Location  Hip    Pain Orientation  Right    Pain Descriptors / Indicators  Stabbing    Pain Type  Chronic pain    Pain Radiating Towards  anterior + lateral hip and thigh    Pain Onset  More than a month ago    Pain Frequency  Constant    Aggravating Factors   standing and walking    Pain Relieving Factors  rest    Effect of Pain on Daily Activities  limits tolerance standing and walking for ADLs/IADLs    Pain Score  4    Pain Location  Hip    Pain Orientation  Left    Pain Descriptors / Indicators  Stabbing    Pain Type  Chronic pain    Pain Radiating Towards  anterior and lateral hip and thigh    Pain Onset  More than a month ago    Pain Frequency  Constant    Aggravating Factors   standing and walking    Pain Relieving Factors  rest    Effect of Pain on Daily Activities  limits tolerance standing and walking         [facility] PT Assessment - 07/29/18 0001      Assessment   Medical Diagnosis  Lumbar pain/DJD, bilat. hip pain    Referring Provider (PT)  Geryl Rankins, NP    Onset Date/Surgical Date  07/30/08   estimated   Prior Therapy  none      Precautions   Precautions  Anterior Hip      Restrictions   Weight Bearing  Restrictions  No      Balance Screen   Has the patient fallen in the past 6 months  Yes   reports fall with chair breaking 5 months ago  How many times?  [place]  Spouse/significant other;Children    [place] to enter    Entrance Stairs-Number of Steps  3    Entrance Stairs-Rails  Right;Left      Prior Function   Level of Independence  Independent with basic ADLs      Cognition   Overall Cognitive Status  Within Functional Limits for tasks assessed      Sensation   Light Touch  --   decreased sensation to light touch bilat. feet     ROM / Strength   AROM / PROM / Strength  AROM;Strength      AROM   AROM Assessment Site  Hip;Lumbar    Right/Left Hip  Right;Left    Right Hip Flexion  90    Right Hip External Rotation   20    Right Hip Internal Rotation   10    Right Hip ABduction  20    Left Hip Flexion  95    Left Hip External Rotation   20    Left Hip Internal Rotation   20    Left Hip ABduction  25    Lumbar Flexion  40    Lumbar Extension  15    Lumbar - Right Side Bend  10    Lumbar - Left Side Bend  21    Lumbar - Right Rotation  40%    Lumbar - Left Rotation  50%      Strength   Overall Strength Comments  Bilat. LE MMTs grossly 4/5 excepting left knee ext and hip ER 4+/5      Palpation   Palpation comment  TTP bilat. greater trochanter/lateral hip                Objective measurements completed on examination: See above findings.      [facility] Adult PT Treatment/Exercise - 07/29/18 0001      Exercises   Exercises  Lumbar;Knee/Hip      Lumbar Exercises: Stretches   Passive Hamstring Stretch  Right;Left;2 reps;20 seconds    Single Knee to Chest Stretch  Right;Left;5 reps;10 seconds      Lumbar Exercises: Supine   Pelvic Tilt  10 reps    Glut Set  10 reps    Bent Knee Raise  5 reps             PT Education - 07/29/18 1325    Education  Details  potential etiology symptoms, HEP, POC, pain education    Person(s) Educated  Patient    Methods  Explanation;Demonstration;Handout;Verbal cues;Tactile cues    Comprehension  Returned demonstration;Verbalized understanding          PT Long Term Goals - 07/29/18 1335      PT LONG TERM GOAL #1   Title  Independent with HEP    Baseline  no HEP    Time  4    Period  Weeks    Status  New    Target Date  09/02/18      PT LONG TERM GOAL #2   Title  Increase trunk flexion AROM at least 10-20 deg to improve ability to tie shoes and pick up items off of floor    Baseline  40 deg    Time  4    Period  Weeks    Target Date  09/02/18  PT LONG TERM GOAL #3   Title  Tolerate ambulation at least 10 minutes for chores at home, brief community trips/shopping    Baseline  <5 minutes    Time  4    Period  Weeks    Status  New    Target Date  09/02/18             Plan - 07/29/18 1326    Clinical Impression Statement  Pt. presents with chronic LBP with flexion bias ROM consistent with underlying DJD along with bilat. hip pain with history THA. He presents with core and diffuse LE weakness, decreased trunk and hip ROM, muscle tightness. Given diffuse nature symptoms along with comorbidities would suspect central sensitization as contributing to symptoms as well. Pt. does report LE radiating pain which could be radicular but also difficulty differentiating from neuropathy symptoms and referred pain from hips. Pt. would benefit from PT to address current associated functional limitations,    History and Personal Factors relevant to plan of care:  chronic pain history, surgical history for THA bilat. hips, tobacco use, diabetes/neuropathy    Clinical Presentation  Evolving    Clinical Presentation due to:  radicular vs. hip pain of unclear etiology s/p THA    Clinical Decision Making  Moderate    Rehab Potential  Fair    Clinical Impairments Affecting Rehab Potential  duration  symptoms/chronic pain history, unclear etiology hip pain    PT Frequency  --   eval + 3 treatment visits   PT Duration  4 weeks    PT Treatment/Interventions  ADLs/Self Care Home Management;Electrical Stimulation;Cryotherapy;Moist Heat;Taping;Dry needling;Manual techniques;Patient/family education;Therapeutic activities;Therapeutic exercise;Gait training;Functional mobility training;Iontophoresis 4mg /ml Dexamethasone    PT Next Visit Plan  review HEP as needed, NUSTEP, mat based flexion bias core and hip strength, seated vs. standing LE strengthening as tolerated, hamstring stretches, hip stretches but caution excessive abd/ER/ext ROM given history anterior THA    PT Home Exercise Plan  pelvic tilts, SKTC, glut sets, supine marches    Consulted and Agree with Plan of Care  Patient       Patient will benefit from skilled therapeutic intervention in order to improve the following deficits and impairments:  Pain, Impaired sensation, Obesity, Impaired flexibility, Difficulty walking, Decreased activity tolerance, Decreased endurance, Decreased range of motion, Decreased strength, Hypomobility  Visit Diagnosis: Chronic bilateral low back pain without sciatica  Pain in right hip  Pain in left hip  Muscle weakness (generalized)  Difficulty in walking, not elsewhere classified     Problem List Patient Active Problem List   Diagnosis Date Noted  . Need for influenza vaccination 07/17/2017  . Acute pain of right shoulder 04/15/2017  . Acute diastolic congestive heart failure ([place]) 01/02/2017  . Diabetic ketoacidosis without coma associated with type 2 diabetes mellitus ([place])   . Primary dental caries, multisurface origin   . DKA, type 2 ([place]) 12/12/2016  . Left ventricular diastolic dysfunction 29/93/7169  . Hypertension 12/12/2016  . Asthma 12/12/2016  . Acute kidney injury ([place]) 12/12/2016  . Arthritis 12/12/2016  . Obesity (BMI 30.0-34.9) 12/12/2016  . Folliculitis of right axilla  12/12/2016  . Tooth decayed 12/12/2016  . Type 2 diabetes mellitus treated without insulin ([place]) 11/07/2016  . Primary osteoarthritis of right hip 09/07/2016  . Primary localized osteoarthritis of left hip 06/25/2016  . Primary osteoarthritis of left hip 06/24/2016  . Tobacco use disorder 10/13/2015  . Gastroesophageal reflux disease without esophagitis 07/14/2015  . Essential hypertension, benign 03/24/2015  .  Chronic diastolic congestive heart failure ([place]) 03/24/2015  . Morbid obesity due to excess calories ([place]) 03/24/2015  . Nicotine abuse 03/24/2015  . Generalized anxiety disorder 03/24/2015  . Primary gout 03/24/2015  . Chronic pain syndrome 03/24/2015  . COPD (chronic obstructive pulmonary disease) ([place])   . HLD (hyperlipidemia) 03/11/2015  . GERD (gastroesophageal reflux disease) 03/11/2015  . Depression 03/11/2015  . SOB (shortness of breath) 03/11/2015  . Bilateral leg edema 03/11/2015  . Alcohol abuse 03/11/2015  . Elevated troponin 03/11/2015  . Diverticulitis large intestine 07/08/2013  . Personal history of colonic polyps 03/10/2013  . Diverticulitis 02/26/2013  . Diverticulosis 02/03/2013  . Tobacco abuse 12/30/2012  . Gout    Beaulah Dinning, PT, DPT 07/29/18 1:40 PM  [facility] [address] [place], [place], [address] Phone: [phone]   Fax:  [phone]  Preston [name] MRN: [id number] Date of Birth: 03/26/1971

## 2018-08-13 ENCOUNTER — Ambulatory Visit: Payer: Medicaid Other | Admitting: Physical Therapy

## 2018-08-19 ENCOUNTER — Encounter: Payer: Self-pay | Admitting: Physical Therapy

## 2018-08-19 ENCOUNTER — Ambulatory Visit: Payer: Medicaid Other | Attending: Nurse Practitioner | Admitting: Physical Therapy

## 2018-08-19 DIAGNOSIS — G8929 Other chronic pain: Secondary | ICD-10-CM | POA: Diagnosis not present

## 2018-08-19 DIAGNOSIS — M6281 Muscle weakness (generalized): Secondary | ICD-10-CM | POA: Diagnosis not present

## 2018-08-19 DIAGNOSIS — D2330 Other benign neoplasm of skin of unspecified part of face: Secondary | ICD-10-CM | POA: Diagnosis not present

## 2018-08-19 DIAGNOSIS — M25551 Pain in right hip: Secondary | ICD-10-CM | POA: Insufficient documentation

## 2018-08-19 DIAGNOSIS — M25552 Pain in left hip: Secondary | ICD-10-CM | POA: Diagnosis not present

## 2018-08-19 DIAGNOSIS — R262 Difficulty in walking, not elsewhere classified: Secondary | ICD-10-CM | POA: Insufficient documentation

## 2018-08-19 DIAGNOSIS — M545 Low back pain, unspecified: Secondary | ICD-10-CM

## 2018-08-19 DIAGNOSIS — L738 Other specified follicular disorders: Secondary | ICD-10-CM | POA: Diagnosis not present

## 2018-08-19 DIAGNOSIS — C44310 Basal cell carcinoma of skin of unspecified parts of face: Secondary | ICD-10-CM | POA: Diagnosis not present

## 2018-08-19 DIAGNOSIS — L814 Other melanin hyperpigmentation: Secondary | ICD-10-CM | POA: Diagnosis not present

## 2018-08-19 NOTE — Therapy (Signed)
[place], [place], [address] Phone: [phone]   Fax:  [phone]  Physical Therapy Treatment  Patient Details  Name: Preston [name] MRN: [id number] Date of Birth: [birth year] Referring Provider (PT): Geryl Rankins, NP   Encounter Date: 08/19/2018  PT End of Session - 08/19/18 1234    Visit Number  2    Number of Visits  4    Date for PT Re-Evaluation  09/02/18    Authorization Type  Medicaid    Authorization Time Period  08/11/18-09/10/18    Authorization - Visit Number  1    Authorization - Number of Visits  3    PT Start Time  1146    PT Stop Time  1225    PT Time Calculation (min)  39 min    Activity Tolerance  Patient tolerated treatment well    Behavior During Therapy  [facility] for tasks assessed/performed       Past Medical History:  Diagnosis Date  . Anxiety   . Asthma    daily inhaler  . Cancer ([place])    pt. reports diagnosed with skin cancer on face/uncertain type but he reports may be basal cell, pending surgery to remove 09/03/18  . CHF (congestive heart failure) ([place])    no cardiologist  . Chronic cough    states due to COPD  . Chronic lower back pain   . COPD (chronic obstructive pulmonary disease) (HCC)    no home O2  . Dyspnea    with humidity and high pollen count  . Edentulous    upper gum  . GERD (gastroesophageal reflux disease)   . Gout   . History of Clostridium difficile infection 2014  . Hypercholesterolemia   . Hypertension    states under control with med., has been on med. x 4 yr.  . Insulin dependent diabetes mellitus ([place])   . Osteoarthritis    bilat. hip  . Panic attacks   . Shoulder pain, right 04/18/2017    Past Surgical History:  Procedure Laterality Date  . COLON SURGERY     14 inches of colon removed  . COLONOSCOPY WITH PROPOFOL N/A 04/01/2013   Procedure: COLONOSCOPY WITH PROPOFOL;  Surgeon: Arta Silence, MD;  Location: [facility] ENDOSCOPY;  Service:  Endoscopy;  Laterality: N/A;  . FLEXIBLE SIGMOIDOSCOPY N/A 02/28/2013   Procedure: FLEXIBLE SIGMOIDOSCOPY;  Surgeon: Winfield Cunas., MD;  Location[facility] ENDOSCOPY;  Service: Endoscopy;  Laterality: N/A;  . LAPAROSCOPIC APPENDECTOMY  07/05/2009  . LAPAROSCOPIC LOW ANTERIOR RESECTION N/A 07/08/2013   Procedure: LAPAROSCOPIC LOW ANTERIOR RESECTION WITH TAKEDOWN OF SPLENIC FLEXURE.;  Surgeon: Adin Hector, MD;  Location: [place];  Service: General;  Laterality: N/A;  . MANDIBLE SURGERY Left 2010   "dry skin pocket cut out"  . MULTIPLE EXTRACTIONS WITH ALVEOLOPLASTY N/A 04/24/2017   Procedure: SURGICAL REMOVAL OF TEETH NUMBERS `7 20-29 AND 31;  Surgeon: Michael Litter, DMD;  Location: [place];  Service: Oral Surgery;  Laterality: N/A;  . TOTAL HIP ARTHROPLASTY Left 06/25/2016   Procedure: TOTAL HIP ARTHROPLASTY ANTERIOR APPROACH;  Surgeon: Frederik Pear, MD;  Location: [place];  Service: Orthopedics;  Laterality: Left;  . TOTAL HIP ARTHROPLASTY Right 09/10/2016   Procedure: TOTAL HIP ARTHROPLASTY ANTERIOR APPROACH;  Surgeon: Frederik Pear, MD;  Location: [place];  Service: Orthopedics;  Laterality: Right;    There were no vitals filed for this visit.  Subjective Assessment - 08/19/18 1148    Subjective  Pt. returns for first follow up since eval 07/29/18. Status about the same as at eval with pain. HEP going OK but continues with pain.    Currently in Pain?  Yes    Pain Score  7     Pain Location  Back    Pain Orientation  Lower;Mid    Pain Descriptors / Indicators  Burning;Shooting    Pain Type  Chronic pain    Aggravating Factors   standing and walking, sitting    Pain Relieving Factors  no eases    Effect of Pain on Daily Activities  limits positional tolerance, ability for standing and walking    Multiple Pain Sites  Yes    Pain Score  7    Pain Location  Hip    Pain Orientation  Right    Pain Descriptors / Indicators  Stabbing    Pain Type  Chronic pain    Pain Radiating  Towards  anterior and lateral hip and thigh    Pain Onset  More than a month ago    Pain Frequency  Constant    Aggravating Factors   standing and walking    Pain Relieving Factors  rest    Effect of Pain on Daily Activities  limits tolerance standing and walking tolerance    Pain Score  7    Pain Location  Hip    Pain Orientation  Left    Pain Descriptors / Indicators  Stabbing    Pain Type  Chronic pain    Pain Radiating Towards  anterior and lateral hip and thigh    Pain Onset  More than a month ago    Pain Frequency  Constant    Aggravating Factors   standing and walking    Pain Relieving Factors  rest    Effect of Pain on Daily Activities  limits tolerance standing and walking                       [facility] Adult PT Treatment/Exercise - 08/19/18 0001      Lumbar Exercises: Stretches   Passive Hamstring Stretch  Right;Left;3 reps;30 seconds    Single Knee to Chest Stretch  Right;Left;3 reps;20 seconds    Other Lumbar Stretch Exercise  gentle lateral hip stretch, knee flexed 30 sec x 3 ea. bilat.      Lumbar Exercises: Supine   Pelvic Tilt  15 reps    Glut Set  15 reps   glut set to partial bridge lift off   Bent Knee Raise  10 reps    Other Supine Lumbar Exercises  hip add. isometric 3-5 sec x 15 reps      Manual Therapy   Manual Therapy  Soft tissue mobilization    Soft tissue mobilization  roller use bilat. quada, lateral hips, IT bands in supine with legs propped             PT Education - 08/19/18 1233    Education Details  POC, expected muscle soreness from exercises vs. exacerbation hip and lumbar pain    Person(s) Educated  Patient    Methods  Explanation    Comprehension  Verbalized understanding          PT Long Term Goals - 08/19/18 1238      PT LONG TERM GOAL #1   Title  Independent with HEP    Baseline  instructed at eval, will update prn    Time  4  Period  Weeks    Status  On-going      PT LONG TERM GOAL #2   Title   Increase trunk flexion AROM at least 10-20 deg to improve ability to tie shoes and pick up items off of floor    Baseline  40 deg at eval, not retested today    Time  4    Period  Weeks    Status  On-going      PT LONG TERM GOAL #3   Title  Tolerate ambulation at least 10 minutes for chores at home, brief community trips/shopping    Baseline  <5 minutes    Time  4    Period  Weeks    Status  On-going            Plan - 08/19/18 1235    Clinical Impression Statement  Some general muscle soreness and fatigue but otherwise initial tx. progression for first tx. session well-tolerated. Limited progress thus far with HEP from eval but given chronic pain historyb expect progress/functional gains will be gradual.    Clinical Impairments Affecting Rehab Potential  duration symptoms/chronic pain history, unclear etiology hip pain    PT Frequency  --   eval + 3 tx. visits   PT Duration  4 weeks    PT Treatment/Interventions  ADLs/Self Care Home Management;Electrical Stimulation;Cryotherapy;Moist Heat;Taping;Dry needling;Manual techniques;Patient/family education;Therapeutic activities;Therapeutic exercise;Gait training;Functional mobility training;Iontophoresis 4mg /ml Dexamethasone    PT Next Visit Plan  Continue NUSTEP, mat based flexion bias core and hip strength, seated vs. standing LE strengthening as tolerated, hamstring stretches, hip stretches but caution excessive abd/ER/ext ROM given history anterior THA, update HEP as needed    PT Home Exercise Plan  pelvic tilts, SKTC, glut sets, supine marches    Consulted and Agree with Plan of Care  Patient       Patient will benefit from skilled therapeutic intervention in order to improve the following deficits and impairments:  Pain, Impaired sensation, Obesity, Impaired flexibility, Difficulty walking, Decreased activity tolerance, Decreased endurance, Decreased range of motion, Decreased strength, Hypomobility  Visit Diagnosis: Chronic  bilateral low back pain without sciatica  Pain in right hip  Pain in left hip  Muscle weakness (generalized)  Difficulty in walking, not elsewhere classified     Problem List Patient Active Problem List   Diagnosis Date Noted  . Need for influenza vaccination 07/17/2017  . Acute pain of right shoulder 04/15/2017  . Acute diastolic congestive heart failure ([place]) 01/02/2017  . Diabetic ketoacidosis without coma associated with type 2 diabetes mellitus ([place])   . Primary dental caries, multisurface origin   . DKA, type 2 ([place]) 12/12/2016  . Left ventricular diastolic dysfunction 67/59/1638  . Hypertension 12/12/2016  . Asthma 12/12/2016  . Acute kidney injury ([place]) 12/12/2016  . Arthritis 12/12/2016  . Obesity (BMI 30.0-34.9) 12/12/2016  . Folliculitis of right axilla 12/12/2016  . Tooth decayed 12/12/2016  . Type 2 diabetes mellitus treated without insulin ([place]) 11/07/2016  . Primary osteoarthritis of right hip 09/07/2016  . Primary localized osteoarthritis of left hip 06/25/2016  . Primary osteoarthritis of left hip 06/24/2016  . Tobacco use disorder 10/13/2015  . Gastroesophageal reflux disease without esophagitis 07/14/2015  . Essential hypertension, benign 03/24/2015  . Chronic diastolic congestive heart failure ([place]) 03/24/2015  . Morbid obesity due to excess calories ([place]) 03/24/2015  . Nicotine abuse 03/24/2015  . Generalized anxiety disorder 03/24/2015  . Primary gout 03/24/2015  . Chronic pain syndrome 03/24/2015  . COPD (  chronic obstructive pulmonary disease) ([place])   . HLD (hyperlipidemia) 03/11/2015  . GERD (gastroesophageal reflux disease) 03/11/2015  . Depression 03/11/2015  . SOB (shortness of breath) 03/11/2015  . Bilateral leg edema 03/11/2015  . Alcohol abuse 03/11/2015  . Elevated troponin 03/11/2015  . Diverticulitis large intestine 07/08/2013  . Personal history of colonic polyps 03/10/2013  . Diverticulitis 02/26/2013  . Diverticulosis  02/03/2013  . Tobacco abuse 12/30/2012  . Gout     Beaulah Dinning, PT, DPT 08/19/18 12:40 PM  [facility] [address] [place], [place], [address] Phone: [phone]   Fax:  [phone]  Name: Preston [name] MRN: [id number] Date of Birth: Nov 08, 1970

## 2018-08-20 ENCOUNTER — Ambulatory Visit: Payer: Medicaid Other | Attending: Nurse Practitioner

## 2018-08-20 DIAGNOSIS — E785 Hyperlipidemia, unspecified: Secondary | ICD-10-CM | POA: Diagnosis not present

## 2018-08-20 DIAGNOSIS — E119 Type 2 diabetes mellitus without complications: Secondary | ICD-10-CM | POA: Diagnosis not present

## 2018-08-20 DIAGNOSIS — E669 Obesity, unspecified: Secondary | ICD-10-CM | POA: Diagnosis not present

## 2018-08-20 DIAGNOSIS — M109 Gout, unspecified: Secondary | ICD-10-CM | POA: Diagnosis not present

## 2018-08-20 DIAGNOSIS — I10 Essential (primary) hypertension: Secondary | ICD-10-CM

## 2018-08-21 ENCOUNTER — Encounter: Payer: Medicaid Other | Admitting: Physical Therapy

## 2018-08-21 ENCOUNTER — Other Ambulatory Visit: Payer: Self-pay | Admitting: Nurse Practitioner

## 2018-08-21 DIAGNOSIS — K219 Gastro-esophageal reflux disease without esophagitis: Secondary | ICD-10-CM

## 2018-08-21 DIAGNOSIS — E119 Type 2 diabetes mellitus without complications: Secondary | ICD-10-CM

## 2018-08-21 DIAGNOSIS — I10 Essential (primary) hypertension: Secondary | ICD-10-CM

## 2018-08-21 DIAGNOSIS — E782 Mixed hyperlipidemia: Secondary | ICD-10-CM

## 2018-08-21 DIAGNOSIS — M1A9XX0 Chronic gout, unspecified, without tophus (tophi): Secondary | ICD-10-CM

## 2018-08-21 LAB — CMP14+EGFR
ALBUMIN: 4.8 g/dL (ref 4.0–5.0)
ALK PHOS: 75 IU/L (ref 39–117)
ALT: 52 IU/L — ABNORMAL HIGH (ref 0–44)
AST: 25 IU/L (ref 0–40)
Albumin/Globulin Ratio: 2.2 (ref 1.2–2.2)
BILIRUBIN TOTAL: 0.4 mg/dL (ref 0.0–1.2)
BUN / CREAT RATIO: 11 (ref 9–20)
BUN: 9 mg/dL (ref 6–24)
CO2: 21 mmol/L (ref 20–29)
Calcium: 9.8 mg/dL (ref 8.7–10.2)
Chloride: 95 mmol/L — ABNORMAL LOW (ref 96–106)
Creatinine, Ser: 0.83 mg/dL (ref 0.76–1.27)
GFR calc Af Amer: 122 mL/min/{1.73_m2} (ref >59)
GFR calc non Af Amer: 106 mL/min/{1.73_m2} (ref >59)
Globulin, Total: 2.2 g/dL (ref 1.5–4.5)
Glucose: 210 mg/dL — ABNORMAL HIGH (ref 65–99)
Potassium: 4.8 mmol/L (ref 3.5–5.2)
SODIUM: 133 mmol/L — AB (ref 134–144)
Total Protein: 7 g/dL (ref 6.0–8.5)

## 2018-08-21 LAB — CBC
HEMATOCRIT: 43.2 % (ref 37.5–51.0)
HEMOGLOBIN: 15.3 g/dL (ref 13.0–17.7)
MCH: 31 pg (ref 26.6–33.0)
MCHC: 35.4 g/dL (ref 31.5–35.7)
MCV: 87 fL (ref 79–97)
Platelets: 313 10*3/uL (ref 150–450)
RBC: 4.94 x10E6/uL (ref 4.14–5.80)
RDW: 12 % (ref 11.6–15.4)
WBC: 11.6 10*3/uL — ABNORMAL HIGH (ref 3.4–10.8)

## 2018-08-21 LAB — LIPID PANEL
CHOLESTEROL TOTAL: 144 mg/dL (ref 100–199)
Chol/HDL Ratio: 5 ratio (ref 0.0–5.0)
HDL: 29 mg/dL — ABNORMAL LOW (ref >39)
LDL CALC: 53 mg/dL (ref 0–99)
Triglycerides: 312 mg/dL — ABNORMAL HIGH (ref 0–149)
VLDL Cholesterol Cal: 62 mg/dL — ABNORMAL HIGH (ref 5–40)

## 2018-08-21 LAB — URIC ACID: Uric Acid: 5.5 mg/dL (ref 3.7–8.6)

## 2018-08-22 MED ORDER — GABAPENTIN 300 MG PO CAPS: 600.0000 mg | ORAL_CAPSULE | Freq: Three times a day (TID) | ORAL | 3 refills | Status: DC

## 2018-08-25 ENCOUNTER — Telehealth: Payer: Self-pay | Admitting: Nurse Practitioner

## 2018-08-25 DIAGNOSIS — J42 Unspecified chronic bronchitis: Secondary | ICD-10-CM

## 2018-08-25 MED ORDER — ALBUTEROL SULFATE HFA 108 (90 BASE) MCG/ACT IN AERS: 1.0000 | INHALATION_SPRAY | Freq: Four times a day (QID) | RESPIRATORY_TRACT | 0 refills | Status: DC | PRN

## 2018-08-25 NOTE — Telephone Encounter (Signed)
1) Medication(s) Requested (by name): -albuterol (PROVENTIL HFA) 108 (90 Base) MCG/ACT inhaler  2) Pharmacy of Choice: -[place], [place] - [address] 3) Special Requests:   Approved medications will be sent to the pharmacy, we will reach out if there is an issue.  Requests made after 3pm may not be addressed until the following business day!  If a patient is unsure of the name of the medication(s) please note and ask patient to call back when they are able to provide all info, do not send to responsible party until all information is available!

## 2018-08-26 ENCOUNTER — Ambulatory Visit: Payer: Medicaid Other | Admitting: Physical Therapy

## 2018-08-26 ENCOUNTER — Encounter: Payer: Self-pay | Admitting: Physical Therapy

## 2018-08-26 DIAGNOSIS — M6281 Muscle weakness (generalized): Secondary | ICD-10-CM

## 2018-08-26 DIAGNOSIS — M25552 Pain in left hip: Secondary | ICD-10-CM

## 2018-08-26 DIAGNOSIS — G8929 Other chronic pain: Secondary | ICD-10-CM

## 2018-08-26 DIAGNOSIS — R262 Difficulty in walking, not elsewhere classified: Secondary | ICD-10-CM

## 2018-08-26 DIAGNOSIS — M25551 Pain in right hip: Secondary | ICD-10-CM | POA: Diagnosis not present

## 2018-08-26 DIAGNOSIS — M545 Low back pain: Secondary | ICD-10-CM | POA: Diagnosis not present

## 2018-08-26 NOTE — Patient Instructions (Signed)
    hip flexor strech offo EOB

## 2018-08-26 NOTE — Therapy (Signed)
[place], [place], [address] Phone: [phone]   Fax:  [phone]  Physical Therapy Treatment  Patient Details  Name: Preston [name] MRN: [id number] Date of Birth: 12-07-1970 Referring Provider (PT): Geryl Rankins, NP   Encounter Date: 08/26/2018  PT End of Session - 08/26/18 1059    Visit Number  3    Number of Visits  4    Date for PT Re-Evaluation  09/02/18    Authorization Type  Medicaid    PT Start Time  1059    PT Stop Time  1144    PT Time Calculation (min)  45 min    Activity Tolerance  Patient limited by pain       Past Medical History:  Diagnosis Date  . Anxiety   . Asthma    daily inhaler  . Cancer ([place])    pt. reports diagnosed with skin cancer on face/uncertain type but he reports may be basal cell, pending surgery to remove 09/03/18  . CHF (congestive heart failure) ([place])    no cardiologist  . Chronic cough    states due to COPD  . Chronic lower back pain   . COPD (chronic obstructive pulmonary disease) (HCC)    no home O2  . Dyspnea    with humidity and high pollen count  . Edentulous    upper gum  . GERD (gastroesophageal reflux disease)   . Gout   . History of Clostridium difficile infection 2014  . Hypercholesterolemia   . Hypertension    states under control with med., has been on med. x 4 yr.  . Insulin dependent diabetes mellitus ([place])   . Osteoarthritis    bilat. hip  . Panic attacks   . Shoulder pain, right 04/18/2017    Past Surgical History:  Procedure Laterality Date  . COLON SURGERY     14 inches of colon removed  . COLONOSCOPY WITH PROPOFOL N/A 04/01/2013   Procedure: COLONOSCOPY WITH PROPOFOL;  Surgeon: Arta Silence, MD;  Location: [facility] ENDOSCOPY;  Service: Endoscopy;  Laterality: N/A;  . FLEXIBLE SIGMOIDOSCOPY N/A 02/28/2013   Procedure: FLEXIBLE SIGMOIDOSCOPY;  Surgeon: Winfield Cunas., MD;  Location: [facility] ENDOSCOPY;  Service: Endoscopy;  Laterality:  N/A;  . LAPAROSCOPIC APPENDECTOMY  07/05/2009  . LAPAROSCOPIC LOW ANTERIOR RESECTION N/A 07/08/2013   Procedure: LAPAROSCOPIC LOW ANTERIOR RESECTION WITH TAKEDOWN OF SPLENIC FLEXURE.;  Surgeon: Adin Hector, MD;  Location: [place];  Service: General;  Laterality: N/A;  . MANDIBLE SURGERY Left 2010   "dry skin pocket cut out"  . MULTIPLE EXTRACTIONS WITH ALVEOLOPLASTY N/A 04/24/2017   Procedure: SURGICAL REMOVAL OF TEETH NUMBERS `7 20-29 AND 31;  Surgeon: Michael Litter, DMD;  Location: [place];  Service: Oral Surgery;  Laterality: N/A;  . TOTAL HIP ARTHROPLASTY Left 06/25/2016   Procedure: TOTAL HIP ARTHROPLASTY ANTERIOR APPROACH;  Surgeon: Frederik Pear, MD;  Location: [place];  Service: Orthopedics;  Laterality: Left;  . TOTAL HIP ARTHROPLASTY Right 09/10/2016   Procedure: TOTAL HIP ARTHROPLASTY ANTERIOR APPROACH;  Surgeon: Frederik Pear, MD;  Location: [place];  Service: Orthopedics;  Laterality: Right;    There were no vitals filed for this visit.  Subjective Assessment - 08/26/18 1102    Subjective  Pt reports no change in his pain , doing his HEP    Patient Stated Goals  Get better    Currently in Pain?  Yes    Pain Score  7  Pain Location  Back    Pain Orientation  Lower    Pain Descriptors / Indicators  Shooting;Sharp    Pain Type  Chronic pain    Pain Onset  More than a month ago    Pain Frequency  Constant    Aggravating Factors   prolonged standing ( 10') walking and sitting    Pain Relieving Factors  nothing    Pain Score  7    Pain Location  Hip    Pain Orientation  Left;Right    Pain Descriptors / Indicators  Sharp    Pain Type  Chronic pain    Pain Onset  More than a month ago    Pain Frequency  Constant    Aggravating Factors   standing and walking    Pain Relieving Factors  nothing         [facility] PT Assessment - 08/26/18 0001      Assessment   Medical Diagnosis  Lumbar pain/DJD, bilat. hip pain      Flexibility   Soft Tissue Assessment  /Muscle Length  yes    Hamstrings  supine SLR Lt 60, Rt 60                   [facility] Adult PT Treatment/Exercise - 08/26/18 0001      Lumbar Exercises: Stretches   Hip Flexor Stretch  Left;Right;2 reps;30 seconds   off EOB   Figure 4 Stretch  1 rep;Supine;With overpressure   45 sec    Other Lumbar Stretch Exercise  leg lentheners and leg presses, 10 reps each side with 5 sec holds.       Lumbar Exercises: Aerobic   Nustep  L5x5' U/LE      Lumbar Exercises: Supine   Clam  20 reps;1 second   blue band around knees   Dead Bug  5 reps   4 sets    Isometric Hip Flexion  10 reps;5 seconds   VC to breath through exercise     Manual Therapy   Manual Therapy  Manual Traction    Manual Traction  long leg traction in line with body and into hip adduction bilat sides.                   PT Long Term Goals - 08/19/18 1238      PT LONG TERM GOAL #1   Title  Independent with HEP    Baseline  instructed at eval, will update prn    Time  4    Period  Weeks    Status  On-going      PT LONG TERM GOAL #2   Title  Increase trunk flexion AROM at least 10-20 deg to improve ability to tie shoes and pick up items off of floor    Baseline  40 deg at eval, not retested today    Time  4    Period  Weeks    Status  On-going      PT LONG TERM GOAL #3   Title  Tolerate ambulation at least 10 minutes for chores at home, brief community trips/shopping    Baseline  <5 minutes    Time  4    Period  Weeks    Status  On-going            Plan - 08/26/18 1146    Clinical Impression Statement  Preston [name] reports he is still having the pain in his hips and low back.  Explained that his rehab will take time due to the chronic nature of his problems.  He is very tight through his hips, especially hip flexors,  This is most likely holding his pelvis still and placing pressure on his low back.  He is having a precancerous spot removed off his face next week and won't be able to come  in .  He was issued new exerices to progress to in the mean time.  No goals met, only his third visit.      Rehab Potential  Fair    Clinical Impairments Affecting Rehab Potential  duration symptoms/chronic pain history, unclear etiology hip pain    PT Duration  4 weeks    PT Treatment/Interventions  ADLs/Self Care Home Management;Electrical Stimulation;Cryotherapy;Moist Heat;Taping;Dry needling;Manual techniques;Patient/family education;Therapeutic activities;Therapeutic exercise;Gait training;Functional mobility training;Iontophoresis 33m/ml Dexamethasone    PT Next Visit Plan  renewal and request for more visits.     Consulted and Agree with Plan of Care  Patient       Patient will benefit from skilled therapeutic intervention in order to improve the following deficits and impairments:  Pain, Impaired sensation, Obesity, Impaired flexibility, Difficulty walking, Decreased activity tolerance, Decreased endurance, Decreased range of motion, Decreased strength, Hypomobility  Visit Diagnosis: Chronic bilateral low back pain without sciatica  Pain in right hip  Pain in left hip  Muscle weakness (generalized)  Difficulty in walking, not elsewhere classified     Problem List Patient Active Problem List   Diagnosis Date Noted  . Need for influenza vaccination 07/17/2017  . Acute pain of right shoulder 04/15/2017  . Acute diastolic congestive heart failure ([place] 01/02/2017  . Diabetic ketoacidosis without coma associated with type 2 diabetes mellitus ([place]   . Primary dental caries, multisurface origin   . DKA, type 2 ([place] 12/12/2016  . Left ventricular diastolic dysfunction 044/92/0100 . Hypertension 12/12/2016  . Asthma 12/12/2016  . Acute kidney injury ([place] 12/12/2016  . Arthritis 12/12/2016  . Obesity (BMI 30.0-34.9) 12/12/2016  . Folliculitis of right axilla 12/12/2016  . Tooth decayed 12/12/2016  . Type 2 diabetes mellitus treated without insulin ([place] 11/07/2016  .  Primary osteoarthritis of right hip 09/07/2016  . Primary localized osteoarthritis of left hip 06/25/2016  . Primary osteoarthritis of left hip 06/24/2016  . Tobacco use disorder 10/13/2015  . Gastroesophageal reflux disease without esophagitis 07/14/2015  . Essential hypertension, benign 03/24/2015  . Chronic diastolic congestive heart failure ([place] 03/24/2015  . Morbid obesity due to excess calories ([place] 03/24/2015  . Nicotine abuse 03/24/2015  . Generalized anxiety disorder 03/24/2015  . Primary gout 03/24/2015  . Chronic pain syndrome 03/24/2015  . COPD (chronic obstructive pulmonary disease) ([place]   . HLD (hyperlipidemia) 03/11/2015  . GERD (gastroesophageal reflux disease) 03/11/2015  . Depression 03/11/2015  . SOB (shortness of breath) 03/11/2015  . Bilateral leg edema 03/11/2015  . Alcohol abuse 03/11/2015  . Elevated troponin 03/11/2015  . Diverticulitis large intestine 07/08/2013  . Personal history of colonic polyps 03/10/2013  . Diverticulitis 02/26/2013  . Diverticulosis 02/03/2013  . Tobacco abuse 12/30/2012  . Gout     SBoneta LucksrPT  08/26/2018, 11:50 AM  [facility] [place] [address]Phone: [phone]  Fax:  [phone] Name: Preston REITHERMRN: [id number] of Birth: 401-13-1972

## 2018-08-28 ENCOUNTER — Encounter: Payer: Self-pay | Admitting: Cardiology

## 2018-08-28 ENCOUNTER — Ambulatory Visit (INDEPENDENT_AMBULATORY_CARE_PROVIDER_SITE_OTHER): Payer: Medicaid Other | Admitting: Cardiology

## 2018-08-28 VITALS — BP 120/80 | HR 73 | Ht 74.0 in | Wt 286.0 lb

## 2018-08-28 DIAGNOSIS — I5189 Other ill-defined heart diseases: Secondary | ICD-10-CM | POA: Diagnosis not present

## 2018-08-28 DIAGNOSIS — I10 Essential (primary) hypertension: Secondary | ICD-10-CM

## 2018-08-28 DIAGNOSIS — E119 Type 2 diabetes mellitus without complications: Secondary | ICD-10-CM

## 2018-08-28 NOTE — Progress Notes (Signed)
Cardiology Office Note:    Date:  08/28/2018   ID:  CASTER FAYETTE, DOB 1970-11-18, MRN [id number]  PCP:  Gildardo Pounds, NP  Cardiologist:  Candee Furbish, MD  Electrophysiologist:  None   Referring MD: Gildardo Pounds, NP     History of Present Illness:    Preston [name] is a 48 y.o. male here for the evaluation of grade 1 diastolic dysfunction at the request of Geryl Rankins, NP  Has diabetes with hypertension chronic back pain hyperlipidemia and anxiety.  Also has COPD given smoking history.  Difficult to quit.  Has been diagnosed with acute diastolic heart failure and is currently taking Lasix 40 mg daily.  He was hospitalized with shortness of breath possible bronchitis as well.  Understands low-salt diet.  Echocardiogram 06/03/2018: - Left ventricle: The cavity size was normal. Wall thickness was   increased in a pattern of mild LVH. Systolic function was normal.   The estimated ejection fraction was in the range of 55% to 60%.   Wall motion was normal; there were no regional wall motion   abnormalities. Doppler parameters are consistent with abnormal   left ventricular relaxation (grade 1 diastolic dysfunction).  Impressions:  - Normal LV systolic function; mild LVH; mild diastolic   dysfunction.  He has struggled with tobacco cessation he is down to half pack a day.  Sometimes he is winded when he is walking but his conditioning levels have gone down significantly since he has had hip problems.  He also has a basal cell carcinoma on his nose that needs to be resected he states.  He is done well with his Lasix.  No problems.  Denies any fevers chills nausea vomiting syncope bleeding.  Past Medical History:  Diagnosis Date  . Anxiety   . Asthma    daily inhaler  . Cancer ([place])    pt. reports diagnosed with skin cancer on face/uncertain type but he reports may be basal cell, pending surgery to remove 09/03/18  . CHF (congestive heart failure) ([place])    no  cardiologist  . Chronic cough    states due to COPD  . Chronic lower back pain   . COPD (chronic obstructive pulmonary disease) (HCC)    no home O2  . Dyspnea    with humidity and high pollen count  . Edentulous    upper gum  . GERD (gastroesophageal reflux disease)   . Gout   . History of Clostridium difficile infection 2014  . Hypercholesterolemia   . Hypertension    states under control with med., has been on med. x 4 yr.  . Insulin dependent diabetes mellitus ([place])   . Osteoarthritis    bilat. hip  . Panic attacks   . Shoulder pain, right 04/18/2017    Past Surgical History:  Procedure Laterality Date  . COLON SURGERY     14 inches of colon removed  . COLONOSCOPY WITH PROPOFOL N/A 04/01/2013   Procedure: COLONOSCOPY WITH PROPOFOL;  Surgeon: Arta Silence, MD;  Location: [facility];  Service: Endoscopy;  Laterality: N/A;  . FLEXIBLE SIGMOIDOSCOPY N/A 02/28/2013   Procedure: FLEXIBLE SIGMOIDOSCOPY;  Surgeon: Winfield Cunas., MD;  [facility];  Service: Endoscopy;  Laterality: N/A;  . LAPAROSCOPIC APPENDECTOMY  07/05/2009  . LAPAROSCOPIC LOW ANTERIOR RESECTION N/A 07/08/2013   Procedure: LAPAROSCOPIC LOW ANTERIOR RESECTION WITH TAKEDOWN OF SPLENIC FLEXURE.;  Surgeon: Adin Hector, MD;  Location: [place];  Service: General;  Laterality: N/A;  .  MANDIBLE SURGERY Left 2010   "dry skin pocket cut out"  . MULTIPLE EXTRACTIONS WITH ALVEOLOPLASTY N/A 04/24/2017   Procedure: SURGICAL REMOVAL OF TEETH NUMBERS `7 20-29 AND 31;  Surgeon: Michael Litter, DMD;  Location: [place];  Service: Oral Surgery;  Laterality: N/A;  . TOTAL HIP ARTHROPLASTY Left 06/25/2016   Procedure: TOTAL HIP ARTHROPLASTY ANTERIOR APPROACH;  Surgeon: Frederik Pear, MD;  Location: [place];  Service: Orthopedics;  Laterality: Left;  . TOTAL HIP ARTHROPLASTY Right 09/10/2016   Procedure: TOTAL HIP ARTHROPLASTY ANTERIOR APPROACH;  Surgeon: Frederik Pear, MD;  Location: [place];  Service:  Orthopedics;  Laterality: Right;    Current Medications: Current Meds  Medication Sig  . albuterol (PROVENTIL HFA) 108 (90 Base) MCG/ACT inhaler Inhale 1-2 puffs into the lungs every 6 (six) hours as needed for wheezing or shortness of breath (cough).  [name] allopurinol (ZYLOPRIM) 300 MG tablet TAKE 1 TABLET BY MOUTH ONCE DAILY AT BEDTIME  . amLODipine (NORVASC) 5 MG tablet Take 1 tablet (5 mg total) by mouth at bedtime. MUST MAKE APPT FOR FURTHER REFILLS  . aspirin 325 MG EC tablet Take 325 mg by mouth at bedtime.  [name] atorvastatin (LIPITOR) 40 MG tablet TAKE 1 TABLET BY MOUTH  EVERY EVENING  . Blood Glucose Monitoring Suppl (ACCU-CHEK AVIVA PLUS) w/Device KIT 1 each by Does not apply route 3 (three) times daily.  . calcium carbonate (TUMS EX) 750 MG chewable tablet Chew 1 tablet by mouth 3 (three) times daily as needed for heartburn.  . Colchicine 0.6 MG CAPS Take 1 tablet by mouth daily as needed  . DULoxetine (CYMBALTA) 30 MG capsule Take 1 capsule (30 mg total) by mouth daily.  . famotidine (PEPCID) 20 MG tablet TAKE 1 TABLET BY MOUTH  DAILY  . furosemide (LASIX) 40 MG tablet Take 1 tablet (40 mg total) by mouth daily.  [name] gabapentin (NEURONTIN) 300 MG capsule Take 2 capsules (600 mg total) by mouth 3 (three) times daily.  [name] glucose blood (ACCU-CHEK AVIVA PLUS) test strip Use as instructed for 3 times daily testing of blood sugar. E11.9  . hydrOXYzine (ATARAX/VISTARIL) 50 MG tablet Take 50 mg by mouth 3 (three) times daily as needed. for anxiety  . ibuprofen (ADVIL,MOTRIN) 600 MG tablet Take 1 tablet (600 mg total) by mouth every 6 (six) hours as needed.  [name] lisinopril (PRINIVIL,ZESTRIL) 40 MG tablet TAKE 1 TABLET BY MOUTH ONCE DAILY  . metFORMIN (GLUCOPHAGE) 1000 MG tablet Take 0.5 tablets (500 mg total) by mouth 2 (two) times daily with a meal.  . metoprolol tartrate (LOPRESSOR) 50 MG tablet TAKE 1 TABLET BY MOUTH TWICE DAILY     Allergies:   Morphine and related and Adhesive [tape]    Social History   Socioeconomic History  . Marital status: Single    Spouse name: Not on file  . Number of children: Not on file  . Years of education: Not on file  . Highest education level: Not on file  Occupational History  . Not on file  Social Needs  . Financial resource strain: Not on file  . Food insecurity:    Worry: Not on file    Inability: Not on file  . Transportation needs:    Medical: Not on file    Non-medical: Not on file  Tobacco Use  . Smoking status: Current Every Day Smoker    Packs/day: 0.00    Last attempt to quit: 06/11/2016    Years since quitting: 2.2  .  Smokeless tobacco: Former Systems developer    Types: Chew  Substance and Sexual Activity  . Alcohol use: Yes    Comment: 3-6 drinks/day  . Drug use: No  . Sexual activity: Yes  Lifestyle  . Physical activity:    Days per week: Not on file    Minutes per session: Not on file  . Stress: Not on file  Relationships  . Social connections:    Talks on phone: Not on file    Gets together: Not on file    Attends religious service: Not on file    Active member of club or organization: Not on file    Attends meetings of clubs or organizations: Not on file    Relationship status: Not on file  Other Topics Concern  . Not on file  Social History Narrative  . Not on file     Family History: The patient's family history includes Cancer in his father; Heart attack in his mother.  ROS:   Please see the history of present illness.    Positive for chronic pain, back pain muscle pain shortness of breath with activity chest pain chest pressure anxiety all other systems reviewed and are negative.  EKGs/Labs/Other Studies Reviewed:    The following studies were reviewed today: Office notes EKG echocardiogram lab work reviewed  EKG:  EKG is  ordered today.  The ekg ordered today demonstrates normal sinus rhythm 73 with no other abnormalities.  Recent Labs: 08/20/2018: ALT 52; BUN 9; Creatinine, Ser 0.83;  Hemoglobin 15.3; Platelets 313; Potassium 4.8; Sodium 133  Recent Lipid Panel    Component Value Date/Time   CHOL 144 08/20/2018 0908   TRIG 312 (H) 08/20/2018 0908   HDL 29 (L) 08/20/2018 0908   CHOLHDL 5.0 08/20/2018 0908   CHOLHDL 5.6 12/13/2016 0605   VLDL UNABLE TO CALCULATE IF TRIGLYCERIDE OVER 400 mg/dL 12/13/2016 0605   LDLCALC 53 08/20/2018 0908    Physical Exam:    VS:  BP 120/80   Pulse 73   Ht 6' 2"  (1.88 m)   Wt 286 lb (129.7 kg)   BMI 36.72 kg/m     Wt Readings from Last 3 Encounters:  08/28/18 286 lb (129.7 kg)  05/20/18 278 lb (126.1 kg)  07/17/17 269 lb (122 kg)     GEN:  Well nourished, well developed in no acute distress, obese HEENT: Normal NECK: No JVD; No carotid bruits LYMPHATICS: No lymphadenopathy CARDIAC: RRR, no murmurs, rubs, gallops RESPIRATORY:  Clear to auscultation without rales, wheezing or rhonchi  ABDOMEN: Soft, non-tender, non-distended MUSCULOSKELETAL:  No edema; No deformity  SKIN: Warm and dry NEUROLOGIC:  Alert and oriented x 3 PSYCHIATRIC:  Normal affect   ASSESSMENT:    1. Diastolic dysfunction   2. Essential hypertension   3. Type 2 diabetes mellitus treated without insulin ([place])   4. Morbid obesity ([place])    PLAN:    In order of problems listed above:  Grade 1 diastolic dysfunction - This is likely mostly a result of his weight as well as smoking history.  Continue to encourage conditioning, blood pressure control, diabetes control.  Is not unreasonable for him to continue with Lasix.  His grade of diastolic dysfunction is not severe by any means. - Strongly encouraged conditioning, salt restrictions, weight reduction as this will help significantly with his symptomatology.  Morbid obesity -BMI greater than 35 with 2 or more comorbidities.  Likely the crux of many of issues.  When he decreased his carbohydrates this  helped out his diabetes significantly.  Strongly encouraged continued weight loss.  Tobacco use -He  is tried everything.  Continue to work on tobacco cessation.  We discussed different plans to enact.  Overall, I think he is on excellent preventive strategies including statin and excellent blood pressure control.  It is not unreasonable for him to continue with his Lasix.  Some of his lower extremity edema is likely dependent edema secondary to his increased weight.  This has improved.  We can see him back on a as needed follow-up, please let us know if we can be of further assistance.     Medication Adjustments/Labs and Tests Ordered: Current medicines are reviewed at length with the patient today.  Concerns regarding medicines are outlined above.  Orders Placed This Encounter  Procedures  . EKG 12-Lead   No orders of the defined types were placed in this encounter.   Patient Instructions  Medication Instructions:  The current medical regimen is effective;  continue present plan and medications.  Follow-Up: Follow up as needed with Dr Marlou Porch.  Thank you for choosing [facility]!!        Signed, Candee Furbish, MD  08/28/2018 11:25 AM    [place]

## 2018-08-28 NOTE — Patient Instructions (Signed)
Medication Instructions:  The current medical regimen is effective;  continue present plan and medications.  Follow-Up: Follow up as needed with Dr Skains.  Thank you for choosing [facility]!!     

## 2018-09-02 ENCOUNTER — Telehealth: Payer: Self-pay

## 2018-09-02 NOTE — Telephone Encounter (Signed)
CMA spoke to Preston [name] who is on [place].   Was informed on lab results and PCP advising.

## 2018-09-02 NOTE — Telephone Encounter (Signed)
-----   Message from Gildardo Pounds, NP sent at 09/01/2018  8:42 PM EST ----- Cholesterol levels are still high. Please make sure you are taking your cholesterol medication atorvastatin. High cholesterol levels increase your risk of heart attack or stroke. Foods to avoid include red meat/beef, junk foods, fried foods and unhealthy snacking.  Uric acid is normal. Continue allopurinol as prescribed.

## 2018-09-03 DIAGNOSIS — C44311 Basal cell carcinoma of skin of nose: Secondary | ICD-10-CM | POA: Diagnosis not present

## 2018-09-08 ENCOUNTER — Ambulatory Visit: Payer: Medicaid Other | Attending: Nurse Practitioner | Admitting: Nurse Practitioner

## 2018-09-08 ENCOUNTER — Encounter: Payer: Self-pay | Admitting: Nurse Practitioner

## 2018-09-08 VITALS — BP 128/86 | HR 74 | Temp 98.3°F | Ht 74.0 in | Wt 290.6 lb

## 2018-09-08 DIAGNOSIS — K219 Gastro-esophageal reflux disease without esophagitis: Secondary | ICD-10-CM | POA: Diagnosis not present

## 2018-09-08 DIAGNOSIS — Z79899 Other long term (current) drug therapy: Secondary | ICD-10-CM | POA: Diagnosis not present

## 2018-09-08 DIAGNOSIS — Z794 Long term (current) use of insulin: Secondary | ICD-10-CM | POA: Diagnosis not present

## 2018-09-08 DIAGNOSIS — Z888 Allergy status to other drugs, medicaments and biological substances status: Secondary | ICD-10-CM | POA: Diagnosis not present

## 2018-09-08 DIAGNOSIS — I739 Peripheral vascular disease, unspecified: Secondary | ICD-10-CM | POA: Diagnosis not present

## 2018-09-08 DIAGNOSIS — M1A9XX0 Chronic gout, unspecified, without tophus (tophi): Secondary | ICD-10-CM | POA: Diagnosis not present

## 2018-09-08 DIAGNOSIS — E1151 Type 2 diabetes mellitus with diabetic peripheral angiopathy without gangrene: Secondary | ICD-10-CM | POA: Diagnosis not present

## 2018-09-08 DIAGNOSIS — J449 Chronic obstructive pulmonary disease, unspecified: Secondary | ICD-10-CM | POA: Diagnosis not present

## 2018-09-08 DIAGNOSIS — E1142 Type 2 diabetes mellitus with diabetic polyneuropathy: Secondary | ICD-10-CM

## 2018-09-08 DIAGNOSIS — Z885 Allergy status to narcotic agent status: Secondary | ICD-10-CM | POA: Diagnosis not present

## 2018-09-08 DIAGNOSIS — Z7982 Long term (current) use of aspirin: Secondary | ICD-10-CM | POA: Insufficient documentation

## 2018-09-08 DIAGNOSIS — I10 Essential (primary) hypertension: Secondary | ICD-10-CM | POA: Diagnosis not present

## 2018-09-08 DIAGNOSIS — G894 Chronic pain syndrome: Secondary | ICD-10-CM | POA: Diagnosis not present

## 2018-09-08 DIAGNOSIS — Z760 Encounter for issue of repeat prescription: Secondary | ICD-10-CM | POA: Insufficient documentation

## 2018-09-08 DIAGNOSIS — I5033 Acute on chronic diastolic (congestive) heart failure: Secondary | ICD-10-CM | POA: Diagnosis not present

## 2018-09-08 DIAGNOSIS — E7800 Pure hypercholesterolemia, unspecified: Secondary | ICD-10-CM | POA: Insufficient documentation

## 2018-09-08 DIAGNOSIS — I5031 Acute diastolic (congestive) heart failure: Secondary | ICD-10-CM

## 2018-09-08 DIAGNOSIS — I110 Hypertensive heart disease with heart failure: Secondary | ICD-10-CM | POA: Insufficient documentation

## 2018-09-08 DIAGNOSIS — F411 Generalized anxiety disorder: Secondary | ICD-10-CM

## 2018-09-08 DIAGNOSIS — E119 Type 2 diabetes mellitus without complications: Secondary | ICD-10-CM

## 2018-09-08 LAB — GLUCOSE, POCT (MANUAL RESULT ENTRY): POC GLUCOSE: 167 mg/dL — AB (ref 70–99)

## 2018-09-08 MED ORDER — FUROSEMIDE 40 MG PO TABS: 40.0000 mg | ORAL_TABLET | Freq: Every day | ORAL | 0 refills | Status: DC

## 2018-09-08 MED ORDER — ALLOPURINOL 300 MG PO TABS: 300.0000 mg | ORAL_TABLET | Freq: Every day | ORAL | 0 refills | Status: DC

## 2018-09-08 MED ORDER — METFORMIN HCL 1000 MG PO TABS: 500.0000 mg | ORAL_TABLET | Freq: Two times a day (BID) | ORAL | 0 refills | Status: DC

## 2018-09-08 MED ORDER — DULOXETINE HCL 60 MG PO CPEP: 60.0000 mg | ORAL_CAPSULE | Freq: Every day | ORAL | 1 refills | Status: DC

## 2018-09-08 NOTE — Progress Notes (Signed)
Assessment & Plan:  Preston [name] was seen today for medication refill.  Diagnoses and all orders for this visit:  Type 2 diabetes mellitus treated without insulin (HCC) -     Glucose (CBG) -     metFORMIN (GLUCOPHAGE) 1000 MG tablet; Take 0.5 tablets (500 mg total) by mouth 2 (two) times daily with a meal. -     Ambulatory referral to Podiatry Continue blood sugar control as discussed in office today, low carbohydrate diet, and regular physical exercise as tolerated, 150 minutes per week (30 min each day, 5 days per week, or 50 min 3 days per week). Keep blood sugar logs with fasting goal of 90-130 mg/dl, post prandial (after you eat) less than 180.  For Hypoglycemia: BS <60 and Hyperglycemia BS >400; contact the clinic ASAP. Annual eye exams and foot exams are recommended.   Essential hypertension Continue all antihypertensives as prescribed.  Remember to bring in your blood pressure log with you for your follow up appointment.  DASH/Mediterranean Diets are healthier choices for HTN.    Acute diastolic congestive heart failure (HCC) -     furosemide (LASIX) 40 MG tablet; Take 1 tablet (40 mg total) by mouth daily. Work on weight loss. Stop smoking. Continue lasix as prescribed  Generalized anxiety disorder -     DULoxetine (CYMBALTA) 60 MG capsule; Take 1 capsule (60 mg total) by mouth daily.  Chronic pain syndrome -     DULoxetine (CYMBALTA) 60 MG capsule; Take 1 capsule (60 mg total) by mouth daily. -     Ambulatory referral to Orthopedic Surgery as PT has not helped to relieve his pain Work on losing weight to help reduce back pain. May alternate with heat and ice application for pain relief. May also alternate with acetaminophen  as prescribed for back pain. Other alternatives include massage, acupuncture and water aerobics.  You must stay active and avoid a sedentary lifestyle.  Chronic gout without tophus, unspecified cause, unspecified site -     allopurinol (ZYLOPRIM) 300 MG  tablet; Take 1 tablet (300 mg total) by mouth at bedtime. Chronic and stable with medication compliance.  Lab Results  Component Value Date   LABURIC 5.5 08/20/2018    PVD (peripheral vascular disease) ([place]) -     POCT ABI Screening Pilot No Charge  Diabetic polyneuropathy associated with type 2 diabetes mellitus ([place]) Continue gabapentin as prescribed.   Patient has been counseled on age-appropriate routine health concerns for screening and prevention. These are reviewed and up-to-date. Referrals have been placed accordingly. Immunizations are up-to-date or declined.    Subjective:   Chief Complaint  Patient presents with  . Medication Refill    Pt. is here for medication refill. Pt. stated his back and foot still hurt even with therapy.    Preston [name] 48 y.o. male presents to office today for follow up. He has a history of DM type 2 with polyneuropathy, CHF (seeing Cardiology Dr. Marlou Porch for grade 1 Diastolic dysfunction), chronic low back pain (working with PT but reports no relief of symptoms), HTN, HPL.   DM TYPE 2 Chronic and well controlled. Current medications include gabapentin for polyneuropathy and metformin 500 mg BID. He denies any hypo glycemic symptoms. Does not monitor his blood glucose levels at home. Overdue for eye exam.  Lab Results  Component Value Date   HGBA1C 6.3 (A) 05/20/2018    Essential Hypertension Endorses medication compliance taking lisinopril 40 mg and amlodipine 5 mg daily. Well controlled.  Denies chest pain, shortness of breath, palpitations, lightheadedness, dizziness, headaches or BLE edema. Continues to smoke. Trying to quit however declines smoking cessation aids.  BP Readings from Last 3 Encounters:  09/08/18 128/86  08/28/18 120/80  05/20/18 136/90     Review of Systems  Constitutional: Negative for fever, malaise/fatigue and weight loss.  HENT: Negative.  Negative for nosebleeds.   Eyes: Negative.  Negative for blurred  vision, double vision and photophobia.  Respiratory: Positive for cough (chronic; COPD). Negative for shortness of breath.   Cardiovascular: Positive for leg swelling. Negative for chest pain and palpitations.  Gastrointestinal: Positive for heartburn. Negative for nausea and vomiting.  Musculoskeletal: Positive for back pain (chronic, requests referral to pain management). Negative for myalgias.  Neurological: Positive for tingling and sensory change. Negative for dizziness, focal weakness, seizures and headaches.  Psychiatric/Behavioral: Negative for suicidal ideas. The patient is nervous/anxious.     Past Medical History:  Diagnosis Date  . Anxiety   . Asthma    daily inhaler  . Cancer ([place])    pt. reports diagnosed with skin cancer on face/uncertain type but he reports may be basal cell, pending surgery to remove 09/03/18  . CHF (congestive heart failure) ([place])    no cardiologist  . Chronic cough    states due to COPD  . Chronic lower back pain   . COPD (chronic obstructive pulmonary disease) (HCC)    no home O2  . Dyspnea    with humidity and high pollen count  . Edentulous    upper gum  . GERD (gastroesophageal reflux disease)   . Gout   . History of Clostridium difficile infection 2014  . Hypercholesterolemia   . Hypertension    states under control with med., has been on med. x 4 yr.  . Insulin dependent diabetes mellitus ([place])   . Osteoarthritis    bilat. hip  . Panic attacks   . Shoulder pain, right 04/18/2017    Past Surgical History:  Procedure Laterality Date  . COLON SURGERY     14 inches of colon removed  . COLONOSCOPY WITH PROPOFOL N/A 04/01/2013   Procedure: COLONOSCOPY WITH PROPOFOL;  Surgeon: Arta Silence, MD;  [facility];  Service: Endoscopy;  Laterality: N/A;  . FLEXIBLE SIGMOIDOSCOPY N/A 02/28/2013   Procedure: FLEXIBLE SIGMOIDOSCOPY;  Surgeon: Winfield Cunas., MD;  Location: [facility]COPY;  Service: Endoscopy;  Laterality: N/A;  .  LAPAROSCOPIC APPENDECTOMY  07/05/2009  . LAPAROSCOPIC LOW ANTERIOR RESECTION N/A 07/08/2013   Procedure: LAPAROSCOPIC LOW ANTERIOR RESECTION WITH TAKEDOWN OF SPLENIC FLEXURE.;  Surgeon: Adin Hector, MD;  Location: [place];  Service: General;  Laterality: N/A;  . MANDIBLE SURGERY Left 2010   "dry skin pocket cut out"  . MULTIPLE EXTRACTIONS WITH ALVEOLOPLASTY N/A 04/24/2017   Procedure: SURGICAL REMOVAL OF TEETH NUMBERS `7 20-29 AND 31;  Surgeon: Michael Litter, DMD;  Location: [place];  Service: Oral Surgery;  Laterality: N/A;  . TOTAL HIP ARTHROPLASTY Left 06/25/2016   Procedure: TOTAL HIP ARTHROPLASTY ANTERIOR APPROACH;  Surgeon: Frederik Pear, MD;  Location: [place];  Service: Orthopedics;  Laterality: Left;  . TOTAL HIP ARTHROPLASTY Right 09/10/2016   Procedure: TOTAL HIP ARTHROPLASTY ANTERIOR APPROACH;  Surgeon: Frederik Pear, MD;  Location: [place];  Service: Orthopedics;  Laterality: Right;    Family History  Problem Relation Age of Onset  . Cancer Father        lymphoma  . Heart attack Mother  Social History Reviewed with no changes to be made today.   Outpatient Medications Prior to Visit  Medication Sig Dispense Refill  . albuterol (PROVENTIL HFA) 108 (90 Base) MCG/ACT inhaler Inhale 1-2 puffs into the lungs every 6 (six) hours as needed for wheezing or shortness of breath (cough). 18 each 0  . amLODipine (NORVASC) 5 MG tablet Take 1 tablet (5 mg total) by mouth at bedtime. MUST MAKE APPT FOR FURTHER REFILLS 90 tablet 0  . aspirin 325 MG EC tablet Take 325 mg by mouth at bedtime.    [name] atorvastatin (LIPITOR) 40 MG tablet TAKE 1 TABLET BY MOUTH  EVERY EVENING 90 tablet 0  . Blood Glucose Monitoring Suppl (ACCU-CHEK AVIVA PLUS) w/Device KIT 1 each by Does not apply route 3 (three) times daily. 1 kit 0  . calcium carbonate (TUMS EX) 750 MG chewable tablet Chew 1 tablet by mouth 3 (three) times daily as needed for heartburn.    . Colchicine 0.6 MG CAPS Take 1 tablet by  mouth daily as needed 30 capsule 3  . famotidine (PEPCID) 20 MG tablet TAKE 1 TABLET BY MOUTH  DAILY 90 tablet 0  . gabapentin (NEURONTIN) 300 MG capsule Take 2 capsules (600 mg total) by mouth 3 (three) times daily. 90 capsule 3  . glucose blood (ACCU-CHEK AVIVA PLUS) test strip Use as instructed for 3 times daily testing of blood sugar. E11.9 300 each 2  . hydrOXYzine (ATARAX/VISTARIL) 50 MG tablet Take 50 mg by mouth 3 (three) times daily as needed. for anxiety    . ibuprofen (ADVIL,MOTRIN) 600 MG tablet Take 1 tablet (600 mg total) by mouth every 6 (six) hours as needed. 30 tablet 0  . lisinopril (PRINIVIL,ZESTRIL) 40 MG tablet TAKE 1 TABLET BY MOUTH ONCE DAILY 90 tablet 0  . metoprolol tartrate (LOPRESSOR) 50 MG tablet TAKE 1 TABLET BY MOUTH TWICE DAILY 180 tablet 0  . allopurinol (ZYLOPRIM) 300 MG tablet TAKE 1 TABLET BY MOUTH ONCE DAILY AT BEDTIME 90 tablet 0  . DULoxetine (CYMBALTA) 30 MG capsule Take 1 capsule (30 mg total) by mouth daily. 90 capsule 0  . furosemide (LASIX) 40 MG tablet Take 1 tablet (40 mg total) by mouth daily. 90 tablet 0  . metFORMIN (GLUCOPHAGE) 1000 MG tablet Take 0.5 tablets (500 mg total) by mouth 2 (two) times daily with a meal. 90 tablet 0   No facility-administered medications prior to visit.     Allergies  Allergen Reactions  . Morphine And Related Itching and Nausea And Vomiting  . Adhesive [Tape] Itching and Rash       Objective:    BP 128/86 (BP Location: Right Arm, Patient Position: Sitting, Cuff Size: Large)   Pulse 74   Temp 98.3 F (36.8 C) (Oral)   Ht _0  (1.88 m)   Wt 290 lb 9.6 oz (131.8 kg)   BMI 37.31 kg/m  Wt Readings from Last 3 Encounters:  09/08/18 290 lb 9.6 oz (131.8 kg)  08/28/18 286 lb (129.7 kg)  05/20/18 278 lb (126.1 kg)    Physical Exam Vitals signs and nursing note reviewed.  Constitutional:      Appearance: He is well-developed.  HENT:     Head: Normocephalic and atraumatic.  Neck:     Musculoskeletal:  Normal range of motion.  Cardiovascular:     Rate and Rhythm: Normal rate and regular rhythm.     Heart sounds: Normal heart sounds. No murmur. No friction rub. No gallop.   Pulmonary:  Effort: Pulmonary effort is normal. No tachypnea or respiratory distress.     Breath sounds: Normal breath sounds. No decreased breath sounds, wheezing, rhonchi or rales.  Chest:     Chest wall: No tenderness.  Abdominal:     General: Bowel sounds are normal.     Palpations: Abdomen is soft.  Musculoskeletal: Normal range of motion.        General: No swelling.     Right lower leg: No edema.     Left lower leg: No edema.  Skin:    General: Skin is warm and dry.  Neurological:     Mental Status: He is alert and oriented to person, place, and time.     Coordination: Coordination normal.  Psychiatric:        Behavior: Behavior normal. Behavior is cooperative.        Thought Content: Thought content normal.        Judgment: Judgment normal.          Patient has been counseled extensively about nutrition and exercise as well as the importance of adherence with medications and regular follow-up. The patient was given clear instructions to go to ER or return to medical center if symptoms don't improve, worsen or new problems develop. The patient verbalized understanding.   Follow-up: Return in about 10 weeks (around 11/17/2018) for DM; make appt with [place] for ABI study .   Gildardo Pounds, FNP-BC [facility] and [facility] [place], [place]   09/12/2018, 11:02 PM

## 2018-09-08 NOTE — Patient Instructions (Signed)
Raynaud Phenomenon  Raynaud phenomenon is a condition that affects the blood vessels (arteries) that carry blood to your fingers and toes. The arteries that supply blood to your ears, lips, nipples, or the tip of your nose might also be affected. Raynaud phenomenon causes the arteries to become narrow temporarily (spasm). As a result, the flow of blood to the affected areas is temporarily decreased. This usually occurs in response to cold temperatures or stress. During an attack, the skin in the affected areas turns white, then blue, and finally red. You may also feel tingling or numbness in those areas. Attacks usually last for only a brief period, and then the blood flow to the area returns to normal. In most cases, Raynaud phenomenon does not cause serious health problems. What are the causes? In many cases, the cause of this condition is not known. The condition may occur on its own (primary Raynaud phenomenon) or may be associated with other diseases or factors (secondary Raynaud phenomenon). Possible causes may include:  Diseases or medical conditions that damage the arteries.  Injuries and repetitive actions that hurt the hands or feet.  Being exposed to certain chemicals.  Taking medicines that narrow the arteries.  Other medical conditions, such as lupus, scleroderma, rheumatoid arthritis, thyroid problems, blood disorders, Sjogren syndrome, or atherosclerosis. What increases the risk? The following factors may make you more likely to develop this condition:  Being 20-40 years old.  Being male.  Having a family history of Raynaud phenomenon.  Living in a cold climate.  Smoking. What are the signs or symptoms? Symptoms of this condition usually occur when you are exposed to cold temperatures or when you have emotional stress. The symptoms may last for a few minutes or up to several hours. They usually affect your fingers but may also affect your toes, nipples, lips, ears, or  the tip of your nose. Symptoms may include:  Changes in skin color. The skin in the affected areas will turn pale or white. The skin may then change from white to bluish to red as normal blood flow returns to the area.  Numbness, tingling, or pain in the affected areas. In severe cases, symptoms may include:  Skin sores.  Tissues decaying and dying (gangrene). How is this diagnosed? This condition may be diagnosed based on:  Your symptoms and medical history.  A physical exam. During the exam, you may be asked to put your hands in cold water to check for a reaction to cold temperature.  Tests, such as: ? Blood tests to check for other diseases or conditions. ? A test to check the movement of blood through your arteries and veins (vascular ultrasound). ? A test in which the skin at the base of your fingernail is examined under a microscope (nailfold capillaroscopy). How is this treated? Treatment for this condition often involves making lifestyle changes and taking steps to control your exposure to cold temperatures. For more severe cases, medicine (calcium channel blockers) may be used to improve blood flow. Surgery is sometimes done to block the nerves that control the affected arteries, but this is rare. Follow these instructions at home: Avoiding cold temperatures Take these steps to avoid exposure to cold:  If possible, stay indoors during cold weather.  When you go outside during cold weather, dress in layers and wear mittens, a hat, a scarf, and warm footwear.  Wear mittens or gloves when handling ice or frozen food.  Use holders for glasses or cans containing cold drinks.    Let warm water run for a while before taking a shower or bath.  Warm up the car before driving in cold weather. Lifestyle   If possible, avoid stressful and emotional situations. Try to find ways to manage your stress, such as: ? Exercise. ? Yoga. ? Meditation. ? Biofeedback.  Do not use any  products that contain nicotine or tobacco, such as cigarettes and e-cigarettes. If you need help quitting, ask your health care provider.  Avoid secondhand smoke.  Limit your use of caffeine. ? Switch to decaffeinated coffee, tea, and soda. ? Avoid chocolate.  Avoid vibrating tools and machinery. General instructions  Protect your hands and feet from injuries, cuts, or bruises.  Avoid wearing tight rings or wristbands.  Wear loose fitting socks and comfortable, roomy shoes.  Take over-the-counter and prescription medicines only as told by your health care provider. Contact a health care provider if:  Your discomfort becomes worse despite lifestyle changes.  You develop sores on your fingers or toes that do not heal.  Your fingers or toes turn black.  You have breaks in the skin on your fingers or toes.  You have a fever.  You have pain or swelling in your joints.  You have a rash.  Your symptoms occur on only one side of your body. Summary  Raynaud phenomenon is a condition that affects the arteries that carry blood to your fingers, toes, ears, lips, nipples, or the tip of your nose.  In many cases, the cause of this condition is not known.  Symptoms of this condition include changes in skin color, and numbness and tingling of the affected area.  Treatment for this condition includes lifestyle changes, reducing exposure to cold temperatures, and using medicines for severe cases of the condition.  Contact your health care provider if your condition worsens despite treatment. This information is not intended to replace advice given to you by your health care provider. Make sure you discuss any questions you have with your health care provider. Document Released: 07/13/2000 Document Revised: 01/29/2017 Document Reviewed: 08/27/2016 Elsevier Interactive Patient Education  2019 [employer].  Neuropathic Pain Neuropathic pain is pain caused by damage to the nerves that  are responsible for certain sensations in your body (sensory nerves). The pain can be caused by:  Damage to the sensory nerves that send signals to your spinal cord and brain (peripheral nervous system).  Damage to the sensory nerves in your brain or spinal cord (central nervous system). Neuropathic pain can make you more sensitive to pain. Even a minor sensation can feel very painful. This is usually a long-term condition that can be difficult to treat. The type of pain differs from person to person. It may:  Start suddenly (acute), or it may develop slowly and last for a long time (chronic).  Come and go as damaged nerves heal, or it may stay at the same level for years.  Cause emotional distress, loss of sleep, and a lower quality of life. What are the causes? The most common cause of this condition is diabetes. Many other diseases and conditions can also cause neuropathic pain. Causes of neuropathic pain can be classified as:  Toxic. This is caused by medicines and chemicals. The most common cause of toxic neuropathic pain is damage from cancer treatments (chemotherapy).  Metabolic. This can be caused by: ? Diabetes. This is the most common disease that damages the nerves. ? Lack of vitamin B from long-term alcohol abuse.  Traumatic. Any injury that cuts,  crushes, or stretches a nerve can cause damage and pain. A common example is feeling pain after losing an arm or leg (phantom limb pain).  Compression-related. If a sensory nerve gets trapped or compressed for a long period of time, the blood supply to the nerve can be cut off.  Vascular. Many blood vessel diseases can cause neuropathic pain by decreasing blood supply and oxygen to nerves.  Autoimmune. This type of pain results from diseases in which the body's defense system (immune system) mistakenly attacks sensory nerves. Examples of autoimmune diseases that can cause neuropathic pain include lupus and multiple  sclerosis.  Infectious. Many types of viral infections can damage sensory nerves and cause pain. Shingles infection is a common cause of this type of pain.  Inherited. Neuropathic pain can be a symptom of many diseases that are passed down through families (genetic). What increases the risk? You are more likely to develop this condition if:  You have diabetes.  You smoke.  You drink too much alcohol.  You are taking certain medicines, including medicines that kill cancer cells (chemotherapy) or that treat immune system disorders. What are the signs or symptoms? The main symptom is pain. Neuropathic pain is often described as:  Burning.  Shock-like.  Stinging.  Hot or cold.  Itching. How is this diagnosed? No single test can diagnose neuropathic pain. It is diagnosed based on:  Physical exam and your symptoms. Your health care provider will ask you about your pain. You may be asked to use a pain scale to describe how bad your pain is.  Tests. These may be done to see if you have a high sensitivity to pain and to help find the cause and location of any sensory nerve damage. They include: ? Nerve conduction studies to test how well nerve signals travel through your sensory nerves (electrodiagnostic testing). ? Stimulating your sensory nerves through electrodes on your skin and measuring the response in your spinal cord and brain (somatosensory evoked potential).  Imaging studies, such as: ? X-rays. ? CT scan. ? MRI. How is this treated? Treatment for neuropathic pain may change over time. You may need to try different treatment options or a combination of treatments. Some options include:  Treating the underlying cause of the neuropathy, such as diabetes, kidney disease, or vitamin deficiencies.  Stopping medicines that can cause neuropathy, such as chemotherapy.  Medicine to relieve pain. Medicines may include: ? Prescription or over-the-counter pain  medicine. ? Anti-seizure medicine. ? Antidepressant medicines. ? Pain-relieving patches that are applied to painful areas of skin. ? A medicine to numb the area (local anesthetic), which can be injected as a nerve block.  Transcutaneous nerve stimulation. This uses electrical currents to block painful nerve signals. The treatment is painless.  Alternative treatments, such as: ? Acupuncture. ? Meditation. ? Massage. ? Physical therapy. ? Pain management programs. ? Counseling. Follow these instructions at home: Medicines   Take over-the-counter and prescription medicines only as told by your health care provider.  Do not drive or use heavy machinery while taking prescription pain medicine.  If you are taking prescription pain medicine, take actions to prevent or treat constipation. Your health care provider may recommend that you: ? Drink enough fluid to keep your urine pale yellow. ? Eat foods that are high in fiber, such as fresh fruits and vegetables, whole grains, and beans. ? Limit foods that are high in fat and processed sugars, such as fried or sweet foods. ? Take an over-the-counter or  prescription medicine for constipation. Lifestyle   Have a good support system at home.  Consider joining a chronic pain support group.  Do not use any products that contain nicotine or tobacco, such as cigarettes and e-cigarettes. If you need help quitting, ask your health care provider.  Do not drink alcohol. General instructions  Learn as much as you can about your condition.  Work closely with all your health care providers to find the treatment plan that works best for you.  Ask your health care provider what activities are safe for you.  Keep all follow-up visits as told by your health care provider. This is important. Contact a health care provider if:  Your pain treatments are not working.  You are having side effects from your medicines.  You are struggling with  tiredness (fatigue), mood changes, depression, or anxiety. Summary  Neuropathic pain is pain caused by damage to the nerves that are responsible for certain sensations in your body (sensory nerves).  Neuropathic pain may come and go as damaged nerves heal, or it may stay at the same level for years.  Neuropathic pain is usually a long-term condition that can be difficult to treat. Consider joining a chronic pain support group. This information is not intended to replace advice given to you by your health care provider. Make sure you discuss any questions you have with your health care provider. Document Released: 04/12/2004 Document Revised: 08/02/2017 Document Reviewed: 08/02/2017 Elsevier Interactive Patient Education  2019 [employer].

## 2018-09-09 ENCOUNTER — Ambulatory Visit: Payer: Medicaid Other | Admitting: Physical Therapy

## 2018-09-12 ENCOUNTER — Encounter: Payer: Self-pay | Admitting: Nurse Practitioner

## 2018-09-12 DIAGNOSIS — E1142 Type 2 diabetes mellitus with diabetic polyneuropathy: Secondary | ICD-10-CM | POA: Insufficient documentation

## 2018-09-16 ENCOUNTER — Ambulatory Visit: Payer: Medicaid Other | Attending: Nurse Practitioner | Admitting: *Deleted

## 2018-09-16 DIAGNOSIS — I10 Essential (primary) hypertension: Secondary | ICD-10-CM | POA: Diagnosis not present

## 2018-09-16 DIAGNOSIS — I739 Peripheral vascular disease, unspecified: Secondary | ICD-10-CM | POA: Diagnosis not present

## 2018-09-16 LAB — POCT ABI - SCREENING FOR PILOT NO CHARGE
Immediate ABI left: 1.2
Immediate ABI right: 1.2

## 2018-09-16 NOTE — Progress Notes (Signed)
Patient arrived ambulatory to clinic for ABI testing.  Patient alert and orientated.  Patient compliant with testing.  Patient complaining of continual numbness in hands and feet.

## 2018-09-17 ENCOUNTER — Telehealth: Payer: Self-pay

## 2018-09-17 ENCOUNTER — Ambulatory Visit (INDEPENDENT_AMBULATORY_CARE_PROVIDER_SITE_OTHER): Payer: Medicaid Other | Admitting: Physician Assistant

## 2018-09-17 ENCOUNTER — Encounter (INDEPENDENT_AMBULATORY_CARE_PROVIDER_SITE_OTHER): Payer: Self-pay | Admitting: Physician Assistant

## 2018-09-17 ENCOUNTER — Ambulatory Visit: Payer: Medicaid Other | Attending: Nurse Practitioner | Admitting: Physical Therapy

## 2018-09-17 DIAGNOSIS — M5442 Lumbago with sciatica, left side: Secondary | ICD-10-CM | POA: Diagnosis not present

## 2018-09-17 DIAGNOSIS — M25551 Pain in right hip: Secondary | ICD-10-CM | POA: Insufficient documentation

## 2018-09-17 DIAGNOSIS — M25552 Pain in left hip: Secondary | ICD-10-CM | POA: Insufficient documentation

## 2018-09-17 DIAGNOSIS — M5441 Lumbago with sciatica, right side: Secondary | ICD-10-CM | POA: Diagnosis not present

## 2018-09-17 DIAGNOSIS — G8929 Other chronic pain: Secondary | ICD-10-CM | POA: Diagnosis not present

## 2018-09-17 DIAGNOSIS — M6281 Muscle weakness (generalized): Secondary | ICD-10-CM | POA: Insufficient documentation

## 2018-09-17 DIAGNOSIS — M545 Low back pain: Secondary | ICD-10-CM | POA: Diagnosis not present

## 2018-09-17 DIAGNOSIS — R262 Difficulty in walking, not elsewhere classified: Secondary | ICD-10-CM | POA: Insufficient documentation

## 2018-09-17 MED ORDER — DIAZEPAM 5 MG PO TABS: ORAL_TABLET | ORAL | 0 refills | Status: DC

## 2018-09-17 MED ORDER — CYCLOBENZAPRINE HCL 10 MG PO TABS: 10.0000 mg | ORAL_TABLET | Freq: Three times a day (TID) | ORAL | 1 refills | Status: DC | PRN

## 2018-09-17 NOTE — Telephone Encounter (Signed)
-----   Message from Gildardo Pounds, NP sent at 09/16/2018 11:49 PM EST ----- Arterial study is negative for any blood flow problems in your legs

## 2018-09-17 NOTE — Telephone Encounter (Signed)
CMA spoke to patient to inform on results.  Pt. Verified DOB. Pt. Understood.  

## 2018-09-17 NOTE — Progress Notes (Signed)
Office Visit Note   Patient: Preston [name]           Date of Birth: 22-Jun-1971           MRN: [id number] Visit Date: 09/17/2018              Requested by: Gildardo Pounds, NP [place], [place] [address] PCP: Gildardo Pounds, NP   Assessment & Plan: Visit Diagnoses:  1. Chronic low back pain with bilateral sciatica, unspecified back pain laterality     Plan: Due to the fact the patient has low back pain which is made worse with prolonged standing and is failed conservative treatment along with his clinical findings today on exam recommend MRI of the lumbar spine to rule out HNP as source of his low back pain or radicular symptoms down both legs.  Follow-up after the MRI to go over results and discuss further treatment.  In the interim having continue his home exercise program stop formal therapy.  Apply moist heat to the back.  He is given Flexeril take his muscle relaxant.  Also he is given Valium to take prior to the MRI is claustrophobic.  Questions encouraged and answered at length  Follow-Up Instructions: Return for after MRI.   Orders:  No orders of the defined types were placed in this encounter.  Meds ordered this encounter  Medications  . DISCONTD: diazepam (VALIUM) 5 MG tablet    Sig: TAKE ONE TAB ONE HOUR PRIOR TO MRI REPEAT AS NEEDED #2 . ZERO REFILLS    Dispense:  2 tablet    Refill:  0  . cyclobenzaprine (FLEXERIL) 10 MG tablet    Sig: Take 1 tablet (10 mg total) by mouth 3 (three) times daily as needed for muscle spasms.    Dispense:  40 tablet    Refill:  1  . diazepam (VALIUM) 5 MG tablet    Sig: TAKE ONE TAB ONE HOUR PRIOR TO MRI REPEAT AS NEEDED #2 . ZERO REFILLS    Dispense:  2 tablet    Refill:  0      Procedures: No procedures performed   Clinical Data: No additional findings.   Subjective: Chief Complaint  Patient presents with  . Lower Back - Pain    HPI [name] is 48 year old male comes in today with low back pain  that is been ongoing for years.  Has a history of bilateral hip replacements by Dr. Elesa Massed last one performed 2018.  Is now having constant low back pain that is worse with standing.  After sitting for prolonged period of time he does have numbness that goes down both legs to the proximal tibia region.  He has neuropathy of both feet.  He has been diabetic for the past 3 to 4 years.  He denies any saddle anesthesia symptoms.  Denies any bowel bladder dysfunction.  Low back pain does awaken him.  Is been going to physical therapy and performing home exercise program with no real relief.  States he cannot stand for long periods of time due to the pain in his low back. Radiographs reviewed on epic AP pelvis lateral view of right hip shows patient be status post bilateral hip replacements.  Components both hips appear well-seated no evidence of loosening or periprosthetic fracture.  No bony abnormalities otherwise.  Lumbar spine AP lateral and oblique views dated 12/10 918 showed no acute fractures.  Wedgelike fracture T11.  This space overall maintained.  Mild  scoliosis.  Minimal degenerative changes lower lumbar spine.  No spondylolisthesis.  Review of Systems Denies any fevers, chills, nausea/vomiting, shortness of breath or chest pain.  Please see HPI otherwise negative.  Objective: Vital Signs: There were no vitals taken for this visit.  Physical Exam Constitutional:      Appearance: He is not ill-appearing or diaphoretic.  Pulmonary:     Effort: Pulmonary effort is normal.  Neurological:     Mental Status: He is alert and oriented to person, place, and time.  Psychiatric:        Mood and Affect: Mood normal.        Behavior: Behavior normal.     Ortho Exam Lumbar spine limited extension flexion lumbar spine.  Positive straight leg raise bilaterally.  Tight hamstrings bilaterally.  5 out of 5 strength throughout the lower extremities against resistance.  Subjective decreased sensation  bilateral feet to light touch.  Deep tendon reflexes 2+ at the knees and 1+ at the ankles and equal and symmetric.  Full range of motion bilateral hips without pain.  Nontender trochanteric region of both hips. Specialty Comments:  No specialty comments available.  Imaging: No results found.   PMFS History: Patient Active Problem List   Diagnosis Date Noted  . Diabetic polyneuropathy associated with type 2 diabetes mellitus ([place]) 09/12/2018  . Need for influenza vaccination 07/17/2017  . Acute pain of right shoulder 04/15/2017  . Acute diastolic congestive heart failure ([place]) 01/02/2017  . Diabetic ketoacidosis without coma associated with type 2 diabetes mellitus ([place])   . Primary dental caries, multisurface origin   . DKA, type 2 ([place]) 12/12/2016  . Left ventricular diastolic dysfunction 91/79/1505  . Hypertension 12/12/2016  . Asthma 12/12/2016  . Acute kidney injury ([place]) 12/12/2016  . Arthritis 12/12/2016  . Obesity (BMI 30.0-34.9) 12/12/2016  . Folliculitis of right axilla 12/12/2016  . Tooth decayed 12/12/2016  . Type 2 diabetes mellitus treated without insulin ([place]) 11/07/2016  . Primary osteoarthritis of right hip 09/07/2016  . Primary localized osteoarthritis of left hip 06/25/2016  . Primary osteoarthritis of left hip 06/24/2016  . Tobacco use disorder 10/13/2015  . Gastroesophageal reflux disease without esophagitis 07/14/2015  . Essential hypertension, benign 03/24/2015  . Chronic diastolic congestive heart failure ([place]) 03/24/2015  . Morbid obesity due to excess calories ([place]) 03/24/2015  . Nicotine abuse 03/24/2015  . Generalized anxiety disorder 03/24/2015  . Primary gout 03/24/2015  . Chronic pain syndrome 03/24/2015  . HLD (hyperlipidemia) 03/11/2015  . GERD (gastroesophageal reflux disease) 03/11/2015  . Depression 03/11/2015  . SOB (shortness of breath) 03/11/2015  . Bilateral leg edema 03/11/2015  . Alcohol abuse 03/11/2015  . Elevated troponin  03/11/2015  . Diverticulitis large intestine 07/08/2013  . Personal history of colonic polyps 03/10/2013  . Diverticulitis 02/26/2013  . Diverticulosis 02/03/2013  . Tobacco abuse 12/30/2012  . Gout    Past Medical History:  Diagnosis Date  . Anxiety   . Asthma    daily inhaler  . Cancer ([place])    pt. reports diagnosed with skin cancer on face/uncertain type but he reports may be basal cell, pending surgery to remove 09/03/18  . CHF (congestive heart failure) ([place])    no cardiologist  . Chronic cough    states due to COPD  . Chronic lower back pain   . COPD (chronic obstructive pulmonary disease) ([facility])    no home O2  . Dyspnea    with humidity and high pollen count  . Edentulous  upper gum  . GERD (gastroesophageal reflux disease)   . Gout   . History of Clostridium difficile infection 2014  . Hypercholesterolemia   . Hypertension    states under control with med., has been on med. x 4 yr.  . Insulin dependent diabetes mellitus ([place])   . Osteoarthritis    bilat. hip  . Panic attacks   . Shoulder pain, right 04/18/2017    Family History  Problem Relation Age of Onset  . Cancer Father        lymphoma  . Heart attack Mother     Past Surgical History:  Procedure Laterality Date  . COLON SURGERY     14 inches of colon removed  . COLONOSCOPY WITH PROPOFOL N/A 04/01/2013   Procedure: COLONOSCOPY WITH PROPOFOL;  Surgeon: Arta Silence, MD;  Location: [facility] ENDOSCOPY;  Service: Endoscopy;  Laterality: N/A;  . FLEXIBLE SIGMOIDOSCOPY N/A 02/28/2013   Procedure: FLEXIBLE SIGMOIDOSCOPY;  Surgeon: Winfield Cunas., MD;  Location: [facility] ENDOSCOPY;  Service: Endoscopy;  Laterality: N/A;  . LAPAROSCOPIC APPENDECTOMY  07/05/2009  . LAPAROSCOPIC LOW ANTERIOR RESECTION N/A 07/08/2013   Procedure: LAPAROSCOPIC LOW ANTERIOR RESECTION WITH TAKEDOWN OF SPLENIC FLEXURE.;  Surgeon: Adin Hector, MD;  Location: [facility];  Service: General;  Laterality: N/A;  . MANDIBLE SURGERY Left 2010   "dry  skin pocket cut out"  . MULTIPLE EXTRACTIONS WITH ALVEOLOPLASTY N/A 04/24/2017   Procedure: SURGICAL REMOVAL OF TEETH NUMBERS `7 20-29 AND 31;  Surgeon: Michael Litter, DMD;  Location: [facility];  Service: Oral Surgery;  Laterality: N/A;  . TOTAL HIP ARTHROPLASTY Left 06/25/2016   Procedure: TOTAL HIP ARTHROPLASTY ANTERIOR APPROACH;  Surgeon: Frederik Pear, MD;  Location: [facility];  Service: Orthopedics;  Laterality: Left;  . TOTAL HIP ARTHROPLASTY Right 09/10/2016   Procedure: TOTAL HIP ARTHROPLASTY ANTERIOR APPROACH;  Surgeon: Frederik Pear, MD;  Location: [facility];  Service: Orthopedics;  Laterality: Right;   Social History   Occupational History  . Not on file  Tobacco Use  . Smoking status: Current Every Day Smoker    Packs/day: 0.00    Last attempt to quit: 06/11/2016    Years since quitting: 2.2  . Smokeless tobacco: Former Systems developer    Types: Chew  Substance and Sexual Activity  . Alcohol use: Yes    Comment: 3-6 drinks/day  . Drug use: No  . Sexual activity: Yes

## 2018-09-18 ENCOUNTER — Other Ambulatory Visit (INDEPENDENT_AMBULATORY_CARE_PROVIDER_SITE_OTHER): Payer: Self-pay

## 2018-09-18 ENCOUNTER — Encounter: Payer: Self-pay | Admitting: Physical Therapy

## 2018-09-18 DIAGNOSIS — M4807 Spinal stenosis, lumbosacral region: Secondary | ICD-10-CM

## 2018-09-18 NOTE — Therapy (Addendum)
[place], [place], [address] Phone: [phone]   Fax:  [phone]  Physical Therapy Treatment/Re-eval/Discharge  Patient Details  Name: Preston [name] MRN: [id number] Date of Birth: Dec 13, 1970 Referring Provider (PT): Geryl Rankins, NP   Encounter Date: 09/17/2018  PT End of Session - 09/18/18 1216    Visit Number  4    Number of Visits  8    Date for PT Re-Evaluation  10/16/18    Authorization Type  Medicaid    Authorization Time Period  08/11/18-09/10/18    Authorization - Visit Number  1    Authorization - Number of Visits  3    PT Start Time  1950    PT Stop Time  1226    PT Time Calculation (min)  41 min    Activity Tolerance  Patient tolerated treatment well    Behavior During Therapy  [facility] for tasks assessed/performed       Past Medical History:  Diagnosis Date  . Anxiety   . Asthma    daily inhaler  . Cancer ([place])    pt. reports diagnosed with skin cancer on face/uncertain type but he reports may be basal cell, pending surgery to remove 09/03/18  . CHF (congestive heart failure) ([place])    no cardiologist  . Chronic cough    states due to COPD  . Chronic lower back pain   . COPD (chronic obstructive pulmonary disease) ([facility])    no home O2  . Dyspnea    with humidity and high pollen count  . Edentulous    upper gum  . GERD (gastroesophageal reflux disease)   . Gout   . History of Clostridium difficile infection 2014  . Hypercholesterolemia   . Hypertension    states under control with med., has been on med. x 4 yr.  . Insulin dependent diabetes mellitus ([place])   . Osteoarthritis    bilat. hip  . Panic attacks   . Shoulder pain, right 04/18/2017    Past Surgical History:  Procedure Laterality Date  . COLON SURGERY     14 inches of colon removed  . COLONOSCOPY WITH PROPOFOL N/A 04/01/2013   Procedure: COLONOSCOPY WITH PROPOFOL;  Surgeon: Arta Silence, MD;  Location: [facility]  ENDOSCOPY;  Service: Endoscopy;  Laterality: N/A;  . FLEXIBLE SIGMOIDOSCOPY N/A 02/28/2013   Procedure: FLEXIBLE SIGMOIDOSCOPY;  Surgeon: Winfield Cunas., MD;  Location[facility] ENDOSCOPY;  Service: Endoscopy;  Laterality: N/A;  . LAPAROSCOPIC APPENDECTOMY  07/05/2009  . LAPAROSCOPIC LOW ANTERIOR RESECTION N/A 07/08/2013   Procedure: LAPAROSCOPIC LOW ANTERIOR RESECTION WITH TAKEDOWN OF SPLENIC FLEXURE.;  Surgeon: Adin Hector, MD;  Location: [place];  Service: General;  Laterality: N/A;  . MANDIBLE SURGERY Left 2010   "dry skin pocket cut out"  . MULTIPLE EXTRACTIONS WITH ALVEOLOPLASTY N/A 04/24/2017   Procedure: SURGICAL REMOVAL OF TEETH NUMBERS `7 20-29 AND 31;  Surgeon: Michael Litter, DMD;  Location: [place];  Service: Oral Surgery;  Laterality: N/A;  . TOTAL HIP ARTHROPLASTY Left 06/25/2016   Procedure: TOTAL HIP ARTHROPLASTY ANTERIOR APPROACH;  Surgeon: Frederik Pear, MD;  Location: [place];  Service: Orthopedics;  Laterality: Left;  . TOTAL HIP ARTHROPLASTY Right 09/10/2016   Procedure: TOTAL HIP ARTHROPLASTY ANTERIOR APPROACH;  Surgeon: Frederik Pear, MD;  Location: [place];  Service: Orthopedics;  Laterality: Right;    There were no vitals filed for this visit.  Subjective Assessment - 09/18/18 1212    Subjective  Patient reports no real change in his symptoms. He feels like he can use his stretches to make his pain less severe but it is still there. He has not done much over the past month 2nd to surgery on his noes. He is going to the MD today.     Pertinent History  Bilat. anterior TKA, chronic pain history, diabetes with neuropathy-impaired LE sensation, gout    Limitations  Sitting;Standing;Walking;House hold activities;Lifting    How long can you sit comfortably?  45 minutes    How long can you stand comfortably?  8-10 minutes    How long can you walk comfortably?  40-50 yards, less than 5 minutes    Diagnostic tests  X-rays    Patient Stated Goals  Get better     Currently in Pain?  Yes    Pain Score  6     Pain Location  Back    Pain Orientation  Right;Left    Pain Descriptors / Indicators  Aching    Pain Radiating Towards  can radiate down to both knees     Pain Onset  More than a month ago    Pain Frequency  Constant    Aggravating Factors   prolonged standing and sitting     Pain Relieving Factors  nothing     Effect of Pain on Daily Activities  limits tolerance to standing          [facility] PT Assessment - 09/18/18 0001      Assessment   Medical Diagnosis  Lumbar pain/DJD, bilat. hip pain    Referring Provider (PT)  Geryl Rankins, NP      AROM   Right Hip Flexion  90    Left Hip Flexion  110    Lumbar Flexion  50    Lumbar Extension  13    Lumbar - Right Rotation  limited 50%     Lumbar - Left Rotation  75% limitation       Strength   Overall Strength Comments  left knee extension 4+/5 left hip flexion improved to 4+/5; right hip and knee still 4/5                    [facility] Adult PT Treatment/Exercise - 09/18/18 0001      Lumbar Exercises: Stretches   Passive Hamstring Stretch  Right;Left;3 reps;30 seconds    Single Knee to Chest Stretch  Right;Left;3 reps;20 seconds    Hip Flexor Stretch  Left;Right;2 reps;30 seconds   off EOB   Other Lumbar Stretch Exercise  leg lentheners and leg presses, 10 reps each side with 5 sec holds.       Lumbar Exercises: Seated   Other Seated Lumbar Exercises  seated ball squeeze 2x10 cuing for posture       Lumbar Exercises: Supine   Clam  20 reps;1 second   blue band around knees   Other Supine Lumbar Exercises  supine march 2x10       Knee/Hip Exercises: Standing   Other Standing Knee Exercises  tennis ball trigger poiunt release 2 min              PT Education - 09/18/18 1213    Education Details  reviewed paln; updated HEP     Person(s) Educated  Patient    Methods  Explanation;Demonstration;Tactile cues;Verbal cues    Comprehension  Verbalized  understanding;Returned demonstration;Verbal cues required;Tactile cues required          PT Long  Term Goals - 09/18/18 1220      PT LONG TERM GOAL #1   Title  Independent with HEP    Baseline  instructed at eval, will update prn    Time  4    Period  Weeks    Status  On-going      PT LONG TERM GOAL #2   Title  Increase trunk flexion AROM at least 10-20 deg to improve ability to tie shoes and pick up items off of floor    Baseline  improved 10 degrees     Time  4    Period  Weeks    Status  Achieved      PT LONG TERM GOAL #3   Title  Tolerate ambulation at least 10 minutes for chores at home, brief community trips/shopping    Baseline  <5 minutes    Time  4    Period  Weeks    Status  On-going      PT LONG TERM GOAL #4   Title  Patient will stand for 30 min without increased pain     Baseline  < 10 minutes without pain     Time  4    Period  Weeks    Status  New    Target Date  10/16/18            Plan - 09/18/18 1217    Clinical Impression Statement  Patient has made objective improvements. He has improved strength in his left LE and improved lumbar movement. He continues to have significant tightness and weakness in his right LE. He has not been able to be conistent with his exercise program and stretching 2nd to other medical complications. He haqs not been to therapy in a month. He will be returning to MD for follow up. If appropraite per MD he would benefit from further thery 1x a week for 4 more weeks. He will be able to be more consitent at this time. He was given a tennis ball for soft tissue mobilization and therapy reviewed his exercises with him     Clinical Presentation  Evolving    Clinical Decision Making  Moderate    Rehab Potential  Fair    Clinical Impairments Affecting Rehab Potential  duration symptoms/chronic pain history, unclear etiology hip pain    PT Frequency  1x / week    PT Duration  4 weeks    PT Treatment/Interventions  ADLs/Self Care  Home Management;Electrical Stimulation;Cryotherapy;Moist Heat;Taping;Dry needling;Manual techniques;Patient/family education;Therapeutic activities;Therapeutic exercise;Gait training;Functional mobility training;Iontophoresis 63m/ml Dexamethasone    PT Next Visit Plan  continue with soft tissue mobilization and core strengthneing if he and MD decide to conytinue with conservative treatment    PT Home Exercise Plan  pelvic tilts, SKTC, glut sets, supine marches    Consulted and Agree with Plan of Care  Patient       Patient will benefit from skilled therapeutic intervention in order to improve the following deficits and impairments:  Pain, Impaired sensation, Obesity, Impaired flexibility, Difficulty walking, Decreased activity tolerance, Decreased endurance, Decreased range of motion, Decreased strength, Hypomobility  Visit Diagnosis: Chronic bilateral low back pain without sciatica  Pain in right hip  Pain in left hip  Muscle weakness (generalized)  Difficulty in walking, not elsewhere classified     Problem List Patient Active Problem List   Diagnosis Date Noted  . Diabetic polyneuropathy associated with type 2 diabetes mellitus ([place] 09/12/2018  . Need for influenza vaccination 07/17/2017  .  Acute pain of right shoulder 04/15/2017  . Acute diastolic congestive heart failure ([place]) 01/02/2017  . Diabetic ketoacidosis without coma associated with type 2 diabetes mellitus ([place])   . Primary dental caries, multisurface origin   . DKA, type 2 ([place]) 12/12/2016  . Left ventricular diastolic dysfunction 04/79/9872  . Hypertension 12/12/2016  . Asthma 12/12/2016  . Acute kidney injury ([place]) 12/12/2016  . Arthritis 12/12/2016  . Obesity (BMI 30.0-34.9) 12/12/2016  . Folliculitis of right axilla 12/12/2016  . Tooth decayed 12/12/2016  . Type 2 diabetes mellitus treated without insulin ([place]) 11/07/2016  . Primary osteoarthritis of right hip 09/07/2016  . Primary localized  osteoarthritis of left hip 06/25/2016  . Primary osteoarthritis of left hip 06/24/2016  . Tobacco use disorder 10/13/2015  . Gastroesophageal reflux disease without esophagitis 07/14/2015  . Essential hypertension, benign 03/24/2015  . Chronic diastolic congestive heart failure ([place]) 03/24/2015  . Morbid obesity due to excess calories ([place]) 03/24/2015  . Nicotine abuse 03/24/2015  . Generalized anxiety disorder 03/24/2015  . Primary gout 03/24/2015  . Chronic pain syndrome 03/24/2015  . HLD (hyperlipidemia) 03/11/2015  . GERD (gastroesophageal reflux disease) 03/11/2015  . Depression 03/11/2015  . SOB (shortness of breath) 03/11/2015  . Bilateral leg edema 03/11/2015  . Alcohol abuse 03/11/2015  . Elevated troponin 03/11/2015  . Diverticulitis large intestine 07/08/2013  . Personal history of colonic polyps 03/10/2013  . Diverticulitis 02/26/2013  . Diverticulosis 02/03/2013  . Tobacco abuse 12/30/2012  . Gout     Carney Living PT DPT  09/18/2018, 12:24 PM  [facility] [address] [place], [place], [address] Phone: [phone]   Fax:  [phone]  Name: Preston [name] MRN: [id number] Date of Birth: [birth year]   PHYSICAL THERAPY DISCHARGE SUMMARY  Visits from Start of Care: 4  Current functional level related to goals / functional outcomes Unknown, see above for function at last visit.  Per MD notes he is going to have further medical testing, MRI   Remaining deficits: unknown   Education / Equipment: HEP Plan: Patient agrees to discharge.  Patient goals were partially met. Patient is being discharged due to                                                    having further medical testing.  ?????      Jeral Pinch, PT 10/01/18 11:37 AM

## 2018-09-28 ENCOUNTER — Other Ambulatory Visit: Payer: Self-pay | Admitting: Nurse Practitioner

## 2018-09-28 DIAGNOSIS — E782 Mixed hyperlipidemia: Secondary | ICD-10-CM

## 2018-10-07 ENCOUNTER — Other Ambulatory Visit: Payer: Self-pay | Admitting: Pharmacist

## 2018-10-07 DIAGNOSIS — I5031 Acute diastolic (congestive) heart failure: Secondary | ICD-10-CM

## 2018-10-07 MED ORDER — FUROSEMIDE 40 MG PO TABS: 40.0000 mg | ORAL_TABLET | Freq: Every day | ORAL | 0 refills | Status: DC

## 2018-10-14 ENCOUNTER — Other Ambulatory Visit: Payer: Medicaid Other

## 2018-10-21 ENCOUNTER — Telehealth (INDEPENDENT_AMBULATORY_CARE_PROVIDER_SITE_OTHER): Payer: Self-pay | Admitting: Radiology

## 2018-10-21 NOTE — Telephone Encounter (Signed)
Rescheduled MRI appointment. Spoke with patients wife.

## 2018-10-22 ENCOUNTER — Ambulatory Visit (INDEPENDENT_AMBULATORY_CARE_PROVIDER_SITE_OTHER): Payer: Medicaid Other | Admitting: Physician Assistant

## 2018-10-28 ENCOUNTER — Other Ambulatory Visit: Payer: Medicaid Other

## 2018-10-29 ENCOUNTER — Other Ambulatory Visit: Payer: Self-pay | Admitting: Physician Assistant

## 2018-10-31 ENCOUNTER — Other Ambulatory Visit: Payer: Self-pay

## 2018-10-31 ENCOUNTER — Ambulatory Visit
Admission: RE | Admit: 2018-10-31 | Discharge: 2018-10-31 | Disposition: A | Discharge status: Home / Self Care | Payer: Medicaid Other | Source: Ambulatory Visit | Attending: Physician Assistant | Admitting: Physician Assistant

## 2018-10-31 ENCOUNTER — Telehealth (INDEPENDENT_AMBULATORY_CARE_PROVIDER_SITE_OTHER): Payer: Self-pay

## 2018-10-31 DIAGNOSIS — M4807 Spinal stenosis, lumbosacral region: Secondary | ICD-10-CM

## 2018-10-31 DIAGNOSIS — M48061 Spinal stenosis, lumbar region without neurogenic claudication: Secondary | ICD-10-CM | POA: Diagnosis not present

## 2018-10-31 NOTE — Telephone Encounter (Signed)
Called patient. He was unavailable and he will call us back. If patient calls back please ask the screening questions. Thank you.  Do you have now or have you had in the past 7 days a fever and/or chills?   Do you have now or have you had in the past 7 days a cough?   Do you have now or have you had in the last 7 days nausea, vomiting or abdominal pain?   Have you been exposed to anyone who has tested positive for COVID-19?   Have you or anyone who lives with you traveled within the last month?

## 2018-11-03 ENCOUNTER — Ambulatory Visit (INDEPENDENT_AMBULATORY_CARE_PROVIDER_SITE_OTHER): Payer: Medicaid Other | Admitting: Orthopaedic Surgery

## 2018-11-05 ENCOUNTER — Ambulatory Visit: Payer: Medicaid Other | Admitting: Podiatry

## 2018-11-11 ENCOUNTER — Other Ambulatory Visit: Payer: Self-pay | Admitting: Nurse Practitioner

## 2018-11-11 DIAGNOSIS — I10 Essential (primary) hypertension: Secondary | ICD-10-CM

## 2018-11-11 DIAGNOSIS — K219 Gastro-esophageal reflux disease without esophagitis: Secondary | ICD-10-CM

## 2018-11-19 ENCOUNTER — Ambulatory Visit: Payer: Medicaid Other | Admitting: Nurse Practitioner

## 2018-12-13 ENCOUNTER — Other Ambulatory Visit: Payer: Self-pay | Admitting: Nurse Practitioner

## 2018-12-13 DIAGNOSIS — E119 Type 2 diabetes mellitus without complications: Secondary | ICD-10-CM

## 2018-12-20 ENCOUNTER — Other Ambulatory Visit: Payer: Self-pay | Admitting: Nurse Practitioner

## 2018-12-20 DIAGNOSIS — J42 Unspecified chronic bronchitis: Secondary | ICD-10-CM

## 2019-01-05 ENCOUNTER — Other Ambulatory Visit: Payer: Self-pay | Admitting: Family Medicine

## 2019-01-05 ENCOUNTER — Other Ambulatory Visit: Payer: Self-pay | Admitting: Nurse Practitioner

## 2019-01-05 DIAGNOSIS — E782 Mixed hyperlipidemia: Secondary | ICD-10-CM

## 2019-01-05 DIAGNOSIS — E119 Type 2 diabetes mellitus without complications: Secondary | ICD-10-CM

## 2019-01-23 ENCOUNTER — Other Ambulatory Visit: Payer: Self-pay | Admitting: Nurse Practitioner

## 2019-01-23 DIAGNOSIS — J42 Unspecified chronic bronchitis: Secondary | ICD-10-CM

## 2019-02-09 ENCOUNTER — Other Ambulatory Visit: Payer: Self-pay | Admitting: Nurse Practitioner

## 2019-02-09 DIAGNOSIS — E119 Type 2 diabetes mellitus without complications: Secondary | ICD-10-CM

## 2019-02-11 ENCOUNTER — Ambulatory Visit: Payer: Medicaid Other | Attending: Nurse Practitioner | Admitting: Nurse Practitioner

## 2019-02-11 ENCOUNTER — Other Ambulatory Visit: Payer: Self-pay

## 2019-02-11 ENCOUNTER — Encounter: Payer: Self-pay | Admitting: Nurse Practitioner

## 2019-02-11 DIAGNOSIS — K219 Gastro-esophageal reflux disease without esophagitis: Secondary | ICD-10-CM

## 2019-02-11 DIAGNOSIS — J42 Unspecified chronic bronchitis: Secondary | ICD-10-CM | POA: Diagnosis not present

## 2019-02-11 DIAGNOSIS — Z8249 Family history of ischemic heart disease and other diseases of the circulatory system: Secondary | ICD-10-CM | POA: Diagnosis not present

## 2019-02-11 DIAGNOSIS — M549 Dorsalgia, unspecified: Secondary | ICD-10-CM | POA: Diagnosis not present

## 2019-02-11 DIAGNOSIS — G894 Chronic pain syndrome: Secondary | ICD-10-CM | POA: Diagnosis not present

## 2019-02-11 DIAGNOSIS — M1A9XX0 Chronic gout, unspecified, without tophus (tophi): Secondary | ICD-10-CM | POA: Diagnosis not present

## 2019-02-11 DIAGNOSIS — Z760 Encounter for issue of repeat prescription: Secondary | ICD-10-CM | POA: Insufficient documentation

## 2019-02-11 DIAGNOSIS — J449 Chronic obstructive pulmonary disease, unspecified: Secondary | ICD-10-CM | POA: Insufficient documentation

## 2019-02-11 DIAGNOSIS — I110 Hypertensive heart disease with heart failure: Secondary | ICD-10-CM | POA: Diagnosis not present

## 2019-02-11 DIAGNOSIS — E119 Type 2 diabetes mellitus without complications: Secondary | ICD-10-CM

## 2019-02-11 DIAGNOSIS — E782 Mixed hyperlipidemia: Secondary | ICD-10-CM

## 2019-02-11 DIAGNOSIS — F17210 Nicotine dependence, cigarettes, uncomplicated: Secondary | ICD-10-CM | POA: Diagnosis not present

## 2019-02-11 DIAGNOSIS — M109 Gout, unspecified: Secondary | ICD-10-CM | POA: Diagnosis not present

## 2019-02-11 DIAGNOSIS — Z79899 Other long term (current) drug therapy: Secondary | ICD-10-CM | POA: Insufficient documentation

## 2019-02-11 DIAGNOSIS — Z794 Long term (current) use of insulin: Secondary | ICD-10-CM | POA: Diagnosis not present

## 2019-02-11 DIAGNOSIS — F411 Generalized anxiety disorder: Secondary | ICD-10-CM

## 2019-02-11 DIAGNOSIS — M5489 Other dorsalgia: Secondary | ICD-10-CM

## 2019-02-11 DIAGNOSIS — I5031 Acute diastolic (congestive) heart failure: Secondary | ICD-10-CM | POA: Diagnosis not present

## 2019-02-11 DIAGNOSIS — I10 Essential (primary) hypertension: Secondary | ICD-10-CM

## 2019-02-11 DIAGNOSIS — G8929 Other chronic pain: Secondary | ICD-10-CM

## 2019-02-11 MED ORDER — ATORVASTATIN CALCIUM 40 MG PO TABS: 40.0000 mg | ORAL_TABLET | Freq: Every evening | ORAL | 0 refills | Status: DC

## 2019-02-11 MED ORDER — FUROSEMIDE 40 MG PO TABS: 40.0000 mg | ORAL_TABLET | Freq: Every day | ORAL | 0 refills | Status: DC

## 2019-02-11 MED ORDER — METFORMIN HCL 1000 MG PO TABS: 500.0000 mg | ORAL_TABLET | Freq: Two times a day (BID) | ORAL | 0 refills | Status: DC

## 2019-02-11 MED ORDER — LISINOPRIL 40 MG PO TABS: 40.0000 mg | ORAL_TABLET | Freq: Every day | ORAL | 0 refills | Status: DC

## 2019-02-11 MED ORDER — HYDROXYZINE HCL 50 MG PO TABS: 50.0000 mg | ORAL_TABLET | Freq: Three times a day (TID) | ORAL | 1 refills | Status: DC | PRN

## 2019-02-11 MED ORDER — IBUPROFEN 600 MG PO TABS: 600.0000 mg | ORAL_TABLET | Freq: Three times a day (TID) | ORAL | 1 refills | Status: DC | PRN

## 2019-02-11 MED ORDER — ALLOPURINOL 300 MG PO TABS: 300.0000 mg | ORAL_TABLET | Freq: Every day | ORAL | 0 refills | Status: DC

## 2019-02-11 MED ORDER — COLCHICINE 0.6 MG PO CAPS: ORAL_CAPSULE | ORAL | 3 refills | Status: DC

## 2019-02-11 MED ORDER — AMLODIPINE BESYLATE 5 MG PO TABS: 5.0000 mg | ORAL_TABLET | Freq: Every day | ORAL | 0 refills | Status: DC

## 2019-02-11 MED ORDER — METOPROLOL TARTRATE 50 MG PO TABS: 50.0000 mg | ORAL_TABLET | Freq: Two times a day (BID) | ORAL | 1 refills | Status: DC

## 2019-02-11 MED ORDER — ACCU-CHEK AVIVA PLUS VI STRP: ORAL_STRIP | 2 refills | Status: DC

## 2019-02-11 MED ORDER — GABAPENTIN 300 MG PO CAPS: 600.0000 mg | ORAL_CAPSULE | Freq: Three times a day (TID) | ORAL | 0 refills | Status: DC

## 2019-02-11 MED ORDER — DULOXETINE HCL 60 MG PO CPEP: 60.0000 mg | ORAL_CAPSULE | Freq: Every day | ORAL | 1 refills | Status: DC

## 2019-02-11 MED ORDER — PROVENTIL HFA 108 (90 BASE) MCG/ACT IN AERS: INHALATION_SPRAY | RESPIRATORY_TRACT | 0 refills | Status: DC

## 2019-02-11 MED ORDER — FAMOTIDINE 20 MG PO TABS: 20.0000 mg | ORAL_TABLET | Freq: Every day | ORAL | 0 refills | Status: DC

## 2019-02-11 NOTE — Progress Notes (Signed)
Virtual Visit via Telephone Note Due to national recommendations of social distancing due to [place] 19, telehealth visit is felt to be most appropriate for this patient at this time.  I discussed the limitations, risks, security and privacy concerns of performing an evaluation and management service by telephone and the availability of in person appointments. I also discussed with the patient that there may be a patient responsible charge related to this service. The patient expressed understanding and agreed to proceed.    I connected with Casey on 02/11/19  at   2:10 PM EDT  EDT by telephone and verified that I am speaking with the correct person using two identifiers.   Consent I discussed the limitations, risks, security and privacy concerns of performing an evaluation and management service by telephone and the availability of in person appointments. I also discussed with the patient that there may be a patient responsible charge related to this service. The patient expressed understanding and agreed to proceed.   Location of Patient: Private  Residence   Location of Provider: [place] and [employer] Office    Persons participating in Telemedicine visit: Geryl Rankins FNP-BC [place]    History of Present Illness: Telemedicine visit for: Follow up Has a past medical history of Anxiety, Asthma, Cancer ([place]), CHF (congestive heart failure) ([place]), Chronic cough, Chronic lower back pain, COPD (chronic obstructive pulmonary disease) ([place]), Dyspnea, Edentulous, GERD (gastroesophageal reflux disease), Gout, History of Clostridium difficile infection (2014), Hypercholesterolemia, Hypertension, Insulin dependent diabetes mellitus ([place]), Osteoarthritis, Panic attacks, and Shoulder pain, right (04/18/2017).  Patient reports doing well today. Main concern is his chronic back pain. States he is still waiting to see Orthopedics and podiatry. Unfortunately was  a no show for both appointments in April.  Requesting referrals and refill of his medications.   DM TYPE 2 Repeat A1c pending. Well controlled. Highest A1C 13.9 and currently 6.3. Current medications include: Metformin 500 mg BID. He denies any hypoglycemic symptoms.  Lab Results  Component Value Date   HGBA1C 6.3 (A) 05/20/2018   Essential Hypertension Chronic and stable. Taking lisinopril 40 mg daily, metoprolol 50 mg BID and amlodipine 5 mg daily. Denies chest pain, shortness of breath, palpitations, lightheadedness, dizziness, headaches or BLE edema.  BP Readings from Last 3 Encounters:  09/08/18 128/86  08/28/18 120/80  05/20/18 136/90    Hyperlipidemia LDL at goal <70. Patient presents for follow up to hyperlipidemia.  He is medication compliant taking atorvastatin 40 mg daily as prescribed. He is not diet or exercise compliant and denies statin intolerance including myalgias.  Lab Results  Component Value Date   CHOL 144 08/20/2018   Lab Results  Component Value Date   HDL 29 (L) 08/20/2018   Lab Results  Component Value Date   LDLCALC 53 08/20/2018   Lab Results  Component Value Date   TRIG 312 (H) 08/20/2018   Lab Results  Component Value Date   CHOLHDL 5.0 08/20/2018     Past Medical History:  Diagnosis Date  . Anxiety   . Asthma    daily inhaler  . Cancer ([place])    pt. reports diagnosed with skin cancer on face/uncertain type but he reports may be basal cell, pending surgery to remove 09/03/18  . CHF (congestive heart failure) ([place])    no cardiologist  . Chronic cough    states due to COPD  . Chronic lower back pain   . COPD (chronic obstructive pulmonary disease) ([place])  no home O2  . Dyspnea    with humidity and high pollen count  . Edentulous    upper gum  . GERD (gastroesophageal reflux disease)   . Gout   . History of Clostridium difficile infection 2014  . Hypercholesterolemia   . Hypertension    states under control with med., has been on  med. x 4 yr.  . Insulin dependent diabetes mellitus ([place])   . Osteoarthritis    bilat. hip  . Panic attacks   . Shoulder pain, right 04/18/2017    Past Surgical History:  Procedure Laterality Date  . COLON SURGERY     14 inches of colon removed  . COLONOSCOPY WITH PROPOFOL N/A 04/01/2013   Procedure: COLONOSCOPY WITH PROPOFOL;  Surgeon: Arta Silence, MD;  Location: [facility];  Service: Endoscopy;  Laterality: N/A;  . FLEXIBLE SIGMOIDOSCOPY N/A 02/28/2013   Procedure: FLEXIBLE SIGMOIDOSCOPY;  Surgeon: Winfield Cunas., MD;  Location[facility];  Service: Endoscopy;  Laterality: N/A;  . LAPAROSCOPIC APPENDECTOMY  07/05/2009  . LAPAROSCOPIC LOW ANTERIOR RESECTION N/A 07/08/2013   Procedure: LAPAROSCOPIC LOW ANTERIOR RESECTION WITH TAKEDOWN OF SPLENIC FLEXURE.;  Surgeon: Adin Hector, MD;  Location: [place];  Service: General;  Laterality: N/A;  . MANDIBLE SURGERY Left 2010   "dry skin pocket cut out"  . MULTIPLE EXTRACTIONS WITH ALVEOLOPLASTY N/A 04/24/2017   Procedure: SURGICAL REMOVAL OF TEETH NUMBERS `7 20-29 AND 31;  Surgeon: Michael Litter, DMD;  Location: [place];  Service: Oral Surgery;  Laterality: N/A;  . TOTAL HIP ARTHROPLASTY Left 06/25/2016   Procedure: TOTAL HIP ARTHROPLASTY ANTERIOR APPROACH;  Surgeon: Frederik Pear, MD;  Location: [place];  Service: Orthopedics;  Laterality: Left;  . TOTAL HIP ARTHROPLASTY Right 09/10/2016   Procedure: TOTAL HIP ARTHROPLASTY ANTERIOR APPROACH;  Surgeon: Frederik Pear, MD;  Location: [place];  Service: Orthopedics;  Laterality: Right;    Family History  Problem Relation Age of Onset  . Cancer Father        lymphoma  . Heart attack Mother     Social History   Socioeconomic History  . Marital status: Single    Spouse name: Not on file  . Number of children: Not on file  . Years of education: Not on file  . Highest education level: Not on file  Occupational History  . Not on file  Social Needs  . Financial resource  strain: Not on file  . Food insecurity    Worry: Not on file    Inability: Not on file  . Transportation needs    Medical: Not on file    Non-medical: Not on file  Tobacco Use  . Smoking status: Current Every Day Smoker    Packs/day: 0.00    Last attempt to quit: 06/11/2016    Years since quitting: 2.6  . Smokeless tobacco: Former Systems developer    Types: Chew  Substance and Sexual Activity  . Alcohol use: Yes    Comment: 3-6 drinks/day  . Drug use: No  . Sexual activity: Yes  Lifestyle  . Physical activity    Days per week: Not on file    Minutes per session: Not on file  . Stress: Not on file  Relationships  . Social Herbalist on phone: Not on file    Gets together: Not on file    Attends religious service: Not on file    Active member of club or organization: Not on file  Attends meetings of clubs or organizations: Not on file    Relationship status: Not on file  Other Topics Concern  . Not on file  Social History Narrative  . Not on file     Observations/Objective: Awake, alert and oriented x 3   Review of Systems  Constitutional: Negative for fever, malaise/fatigue and weight loss.  HENT: Negative.  Negative for nosebleeds.   Eyes: Negative.  Negative for blurred vision, double vision and photophobia.  Respiratory: Negative.  Negative for cough and shortness of breath.   Cardiovascular: Negative.  Negative for chest pain, palpitations and leg swelling.  Gastrointestinal: Positive for heartburn. Negative for nausea and vomiting.  Musculoskeletal: Positive for back pain and myalgias.  Neurological: Positive for sensory change. Negative for dizziness, focal weakness, seizures and headaches.  Psychiatric/Behavioral: Negative for suicidal ideas. The patient is nervous/anxious.     Assessment and Plan: Emiliano was seen today for medication refill. Diagnoses and all orders for this visit:  Chronic back pain greater than 3 months duration -     Ambulatory  referral to Orthopedic Surgery -     CBC; Future Work on losing weight to help reduce back pain. May alternate with heat and ice application for pain relief. May also alternate with acetaminophen and Ibuprofen as prescribed for back pain. Other alternatives include massage, acupuncture and water aerobics.  You must stay active and avoid a sedentary lifestyle.   Encounter for diabetic foot exam ([place]) -     Ambulatory referral to Podiatry  Mixed hyperlipidemia -     Lipid panel; Future -     amLODipine (NORVASC) 5 MG tablet; Take 1 tablet (5 mg total) by mouth at bedtime. -     atorvastatin (LIPITOR) 40 MG tablet; Take 1 tablet (40 mg total) by mouth every evening. INSTRUCTIONS: Work on a low fat, heart healthy diet and participate in regular aerobic exercise program by working out at least 150 minutes per week; 5 days a week-30 minutes per day. Avoid red meat, fried foods. junk foods, sodas, sugary drinks, unhealthy snacking, alcohol and smoking.  Drink at least 48oz of water per day and monitor your carbohydrate intake daily.    Essential hypertension -     CMP14+EGFR; Future -     lisinopril (ZESTRIL) 40 MG tablet; Take 1 tablet (40 mg total) by mouth daily. -     metoprolol tartrate (LOPRESSOR) 50 MG tablet; Take 1 tablet (50 mg total) by mouth 2 (two) times daily. Continue all antihypertensives as prescribed.  Remember to bring in your blood pressure log with you for your follow up appointment.  DASH/Mediterranean Diets are healthier choices for HTN.    Type 2 diabetes mellitus treated without insulin (HCC) -     Hemoglobin A1c; Future -     Microalbumin / creatinine urine ratio; Future -     Ambulatory referral to Ophthalmology -     gabapentin (NEURONTIN) 300 MG capsule; Take 2 capsules (600 mg total) by mouth 3 (three) times daily. -     metFORMIN (GLUCOPHAGE) 1000 MG tablet; Take 0.5 tablets (500 mg total) by mouth 2 (two) times daily with a meal. MUST MAKE APPT FOR FURTHER  REFILLS -     glucose blood (ACCU-CHEK AVIVA PLUS) test strip; Use as instructed for 3 times daily testing of blood sugar. E11.9 Continue blood sugar control as discussed in office today, low carbohydrate diet, and regular physical exercise as tolerated, 150 minutes per week (30 min each day, 5  days per week, or 50 min 3 days per week). Keep blood sugar logs with fasting goal of 90-130 mg/dl, post prandial (after you eat) less than 180.  For Hypoglycemia: BS <60 and Hyperglycemia BS >400; contact the clinic ASAP. Annual eye exams and foot exams are recommended.   Chronic gout without tophus, unspecified cause, unspecified site -     Uric Acid; Future -     allopurinol (ZYLOPRIM) 300 MG tablet; Take 1 tablet (300 mg total) by mouth at bedtime. -     Colchicine 0.6 MG CAPS; Take 1 tablet by mouth daily as needed -     ibuprofen (ADVIL) 600 MG tablet; Take 1 tablet (600 mg total) by mouth every 8 (eight) hours as needed for mild pain or moderate pain.  Chronic pain syndrome -     DULoxetine (CYMBALTA) 60 MG capsule; Take 1 capsule (60 mg total) by mouth daily.  Acute diastolic congestive heart failure (HCC) -     furosemide (LASIX) 40 MG tablet; Take 1 tablet (40 mg total) by mouth daily.  Chronic bronchitis, unspecified chronic bronchitis type (HCC) -     PROVENTIL HFA 108 (90 Base) MCG/ACT inhaler; INHALE 1 TO 2 PUFFS INTO LUNGS EVERY 6 HOURS AS NEEDED FOR SHORTNESS OF BREATH (COUGH) OR WHEEZING  Gastroesophageal reflux disease without esophagitis -     famotidine (PEPCID) 20 MG tablet; Take 1 tablet (20 mg total) by mouth daily. INSTRUCTIONS: Avoid GERD Triggers: acidic, spicy or fried foods, caffeine, coffee, sodas,  alcohol and chocolate.   GAD (generalized anxiety disorder) -     hydrOXYzine (ATARAX/VISTARIL) 50 MG tablet; Take 1 tablet (50 mg total) by mouth 3 (three) times daily as needed. for anxiety     Follow Up Instructions Return in about 3 months (around 05/14/2019).      I discussed the assessment and treatment plan with the patient. The patient was provided an opportunity to ask questions and all were answered. The patient agreed with the plan and demonstrated an understanding of the instructions.   The patient was advised to call back or seek an in-person evaluation if the symptoms worsen or if the condition fails to improve as anticipated.  I provided 21 minutes of non-face-to-face time during this encounter including median intraservice time, reviewing previous notes, labs, imaging, medications and explaining diagnosis and management.  Gildardo Pounds, FNP-BC

## 2019-02-17 ENCOUNTER — Ambulatory Visit: Payer: Medicaid Other | Attending: Nurse Practitioner

## 2019-02-17 ENCOUNTER — Other Ambulatory Visit: Payer: Self-pay

## 2019-02-17 DIAGNOSIS — M549 Dorsalgia, unspecified: Secondary | ICD-10-CM | POA: Diagnosis not present

## 2019-02-17 DIAGNOSIS — I10 Essential (primary) hypertension: Secondary | ICD-10-CM | POA: Diagnosis not present

## 2019-02-17 DIAGNOSIS — G8929 Other chronic pain: Secondary | ICD-10-CM

## 2019-02-17 DIAGNOSIS — M1A9XX0 Chronic gout, unspecified, without tophus (tophi): Secondary | ICD-10-CM

## 2019-02-17 DIAGNOSIS — E782 Mixed hyperlipidemia: Secondary | ICD-10-CM | POA: Diagnosis not present

## 2019-02-17 DIAGNOSIS — E119 Type 2 diabetes mellitus without complications: Secondary | ICD-10-CM | POA: Diagnosis not present

## 2019-02-18 ENCOUNTER — Encounter: Payer: Self-pay | Admitting: Orthopaedic Surgery

## 2019-02-18 ENCOUNTER — Ambulatory Visit (INDEPENDENT_AMBULATORY_CARE_PROVIDER_SITE_OTHER): Payer: Medicaid Other | Admitting: Orthopaedic Surgery

## 2019-02-18 DIAGNOSIS — M545 Low back pain: Secondary | ICD-10-CM

## 2019-02-18 DIAGNOSIS — G8929 Other chronic pain: Secondary | ICD-10-CM | POA: Diagnosis not present

## 2019-02-18 LAB — CBC
Hematocrit: 44.5 % (ref 37.5–51.0)
Hemoglobin: 14.7 g/dL (ref 13.0–17.7)
MCH: 30.4 pg (ref 26.6–33.0)
MCHC: 33 g/dL (ref 31.5–35.7)
MCV: 92 fL (ref 79–97)
Platelets: 321 10*3/uL (ref 150–450)
RBC: 4.84 x10E6/uL (ref 4.14–5.80)
RDW: 13.4 % (ref 11.6–15.4)
WBC: 10.5 10*3/uL (ref 3.4–10.8)

## 2019-02-18 LAB — CMP14+EGFR
ALT: 65 IU/L — ABNORMAL HIGH (ref 0–44)
AST: 30 IU/L (ref 0–40)
Albumin/Globulin Ratio: 2.8 — ABNORMAL HIGH (ref 1.2–2.2)
Albumin: 5 g/dL (ref 4.0–5.0)
Alkaline Phosphatase: 68 IU/L (ref 39–117)
BUN/Creatinine Ratio: 8 — ABNORMAL LOW (ref 9–20)
BUN: 7 mg/dL (ref 6–24)
Bilirubin Total: 0.2 mg/dL (ref 0.0–1.2)
CO2: 19 mmol/L — ABNORMAL LOW (ref 20–29)
Calcium: 10.3 mg/dL — ABNORMAL HIGH (ref 8.7–10.2)
Chloride: 97 mmol/L (ref 96–106)
Creatinine, Ser: 0.84 mg/dL (ref 0.76–1.27)
GFR calc Af Amer: 121 mL/min/{1.73_m2} (ref >59)
GFR calc non Af Amer: 104 mL/min/{1.73_m2} (ref >59)
Globulin, Total: 1.8 g/dL (ref 1.5–4.5)
Glucose: 160 mg/dL — ABNORMAL HIGH (ref 65–99)
Potassium: 4.7 mmol/L (ref 3.5–5.2)
Sodium: 135 mmol/L (ref 134–144)
Total Protein: 6.8 g/dL (ref 6.0–8.5)

## 2019-02-18 LAB — LIPID PANEL
Chol/HDL Ratio: 3.9 ratio (ref 0.0–5.0)
Cholesterol, Total: 140 mg/dL (ref 100–199)
HDL: 36 mg/dL — ABNORMAL LOW (ref >39)
LDL Calculated: 48 mg/dL (ref 0–99)
Triglycerides: 278 mg/dL — ABNORMAL HIGH (ref 0–149)
VLDL Cholesterol Cal: 56 mg/dL — ABNORMAL HIGH (ref 5–40)

## 2019-02-18 LAB — HEMOGLOBIN A1C
Est. average glucose Bld gHb Est-mCnc: 134 mg/dL
Hgb A1c MFr Bld: 6.3 % — ABNORMAL HIGH (ref 4.8–5.6)

## 2019-02-18 LAB — MICROALBUMIN / CREATININE URINE RATIO
Creatinine, Urine: 131.8 mg/dL
Microalb/Creat Ratio: 26 mg/g creat (ref 0–29)
Microalbumin, Urine: 33.8 ug/mL

## 2019-02-18 LAB — URIC ACID: Uric Acid: 5.5 mg/dL (ref 3.7–8.6)

## 2019-02-18 MED ORDER — CYCLOBENZAPRINE HCL 10 MG PO TABS: 10.0000 mg | ORAL_TABLET | Freq: Two times a day (BID) | ORAL | 1 refills | Status: DC | PRN

## 2019-02-18 NOTE — Addendum Note (Signed)
Addended by: Marlyne Beards on: 02/18/2019 05:07 PM   Modules accepted: Orders

## 2019-02-18 NOTE — Progress Notes (Signed)
The patient is someone we have seen before.  He has chronic low back pain.  We actually did obtain an MRI in April of this year but did not believe he was lost to follow-up.  He points to the lower aspect of his lumbar spine as a source of his pain.  There is no sciatic symptoms.  He does have a history of peripheral neuropathy but he was a longtime diabetic.  He is off insulin now.  He used to weigh close to 300 pounds but has lost a significant mountain weight.  He also reports midthoracic pain.  He does have a history of bilateral hip replacements that were done about 2 years ago.  I believe these were done elsewhere.  He says that is certainly change his posture as well.  He does take Flexeril on occasion he would like to have some more of this.  On exam he does not have a positive straight leg raise bilaterally at all.  He does have numbness in his feet but I believe this is longstanding neuropathy from when his diabetes is worse.  He is got good strength in his bilateral extremities as well.  He has pain across the lower lumbar spine with flexion and extension.  There is some pain in the periscapular areas bilaterally.  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  There is no evidence of stenosis or nerve compression in the lumbar spine.  Given his continued pain it is certainly worth trying bilateral L5-S1 facet joint injections.  We will refer him to Dr. Ernestina Patches as a consultation for this.  I will continue his Flexeril.  We will see him back in a month to see how he is done with the injections.  All question concerns were answered and addressed.

## 2019-02-22 ENCOUNTER — Other Ambulatory Visit: Payer: Self-pay | Admitting: Nurse Practitioner

## 2019-02-22 MED ORDER — OMEGA-3-ACID ETHYL ESTERS 1 G PO CAPS: 1.0000 g | ORAL_CAPSULE | Freq: Two times a day (BID) | ORAL | 1 refills | Status: DC

## 2019-03-10 ENCOUNTER — Encounter: Payer: Self-pay | Admitting: Physical Medicine and Rehabilitation

## 2019-03-10 ENCOUNTER — Ambulatory Visit (INDEPENDENT_AMBULATORY_CARE_PROVIDER_SITE_OTHER): Payer: Medicaid Other | Admitting: Physical Medicine and Rehabilitation

## 2019-03-10 ENCOUNTER — Ambulatory Visit: Payer: Self-pay

## 2019-03-10 VITALS — BP 140/94 | HR 83

## 2019-03-10 DIAGNOSIS — M47816 Spondylosis without myelopathy or radiculopathy, lumbar region: Secondary | ICD-10-CM | POA: Diagnosis not present

## 2019-03-10 MED ORDER — METHYLPREDNISOLONE ACETATE 80 MG/ML IJ SUSP: 80.0000 mg | Freq: Once | INTRAMUSCULAR | Status: DC

## 2019-03-10 NOTE — Progress Notes (Signed)
.  Numeric Pain Rating Scale and Functional Assessment Average Pain 7   In the last MONTH (on 0-10 scale) has pain interfered with the following?  1. General activity like being  able to carry out your everyday physical activities such as walking, climbing stairs, carrying groceries, or moving a chair?  Rating(8)   +Driver, -BT, -Dye Allergies.  

## 2019-03-23 ENCOUNTER — Encounter: Payer: Self-pay | Admitting: Orthopaedic Surgery

## 2019-03-23 ENCOUNTER — Ambulatory Visit (INDEPENDENT_AMBULATORY_CARE_PROVIDER_SITE_OTHER): Payer: Medicaid Other | Admitting: Orthopaedic Surgery

## 2019-03-23 DIAGNOSIS — M4807 Spinal stenosis, lumbosacral region: Secondary | ICD-10-CM | POA: Diagnosis not present

## 2019-03-23 DIAGNOSIS — M545 Low back pain, unspecified: Secondary | ICD-10-CM

## 2019-03-23 DIAGNOSIS — G8929 Other chronic pain: Secondary | ICD-10-CM

## 2019-03-23 NOTE — Progress Notes (Signed)
The patient returns for follow-up after having an intervention in his lumbar spine by Dr. Ernestina Patches.  These are likely facet joint injections based on the MRI findings showing only minimal arthritis in his back.  There is no evidence of nerve impingement at all.  We went over the MRI again to show him his lumbar spine.  He does have a history of bilateral hip replacements that were done by Dr. Mayer Camel here in town.  He has not seen Dr. Mayer Camel in at least a year.  He still reports some pain around his hips but this is mainly his low back that was hurting him.  He states that the injections by Dr. Ernestina Patches did not help at all and he still experiencing pain.  On exam he hurts across the lower aspect of his lumbar spine.  Both hips have fluid range of motion with no pain in the groin at all.  From my standpoint there is nothing else that I can recommend other than weight loss and core strengthening exercises for his back.  I have recommended he follow-up with Dr. Mayer Camel for his hips.  He agrees with this as well.  All question concerns were answered and addressed.

## 2019-04-05 ENCOUNTER — Other Ambulatory Visit: Payer: Self-pay | Admitting: Nurse Practitioner

## 2019-04-05 DIAGNOSIS — E119 Type 2 diabetes mellitus without complications: Secondary | ICD-10-CM

## 2019-04-20 NOTE — Procedures (Signed)
Lumbar Facet Joint Intra-Articular Injection(s) with Fluoroscopic Guidance  Patient: Preston [name]      Date of Birth: [birth year] MRN: [id number] PCP: Gildardo Pounds, NP      Visit Date: 03/10/2019   Universal Protocol:    Date/Time: 03/10/2019  Consent Given By: the patient  Position: PRONE   Additional Comments: Vital signs were monitored before and after the procedure. Patient was prepped and draped in the usual sterile fashion. The correct patient, procedure, and site was verified.   Injection Procedure Details:  Procedure Site One Meds Administered:  Meds ordered this encounter  Medications  . methylPREDNISolone acetate (DEPO-MEDROL) injection 80 mg     Laterality: Bilateral  Location/Site:  L5-S1  Needle size: 22 guage  Needle type: Spinal  Needle Placement: Articular  Findings:  -Comments: Excellent flow of contrast producing a partial arthrogram.  Procedure Details: The fluoroscope beam is vertically oriented in AP, and the inferior recess is visualized beneath the lower pole of the inferior apophyseal process, which represents the target point for needle insertion. When direct visualization is difficult the target point is located at the medial projection of the vertebral pedicle. The region overlying each aforementioned target is locally anesthetized with a 1 to 2 ml. volume of 1% Lidocaine without Epinephrine.   The spinal needle was inserted into each of the above mentioned facet joints using biplanar fluoroscopic guidance. A 0.25 to 0.5 ml. volume of Isovue-250 was injected and a partial facet joint arthrogram was obtained. A single spot film was obtained of the resulting arthrogram.    One to 1.25 ml of the steroid/anesthetic solution was then injected into each of the facet joints noted above.   Additional Comments:  The patient tolerated the procedure well Dressing: 2 x 2 sterile gauze and Band-Aid    Post-procedure details: Patient was  observed during the procedure. Post-procedure instructions were reviewed.  Patient left the clinic in stable condition.

## 2019-04-20 NOTE — Progress Notes (Signed)
[place] - 48 y.o. male MRN [id number]  Date of birth: July 10, 1971  Office Visit Note: Visit Date: 03/10/2019 PCP: Gildardo Pounds, NP Referred by: Gildardo Pounds, NP  Subjective: Chief Complaint  Patient presents with  . Lower Back - Pain  . Left Leg - Pain  . Right Leg - Pain   HPI:  Preston [name] is a 48 y.o. male who comes in today At the request of Dr. Jean Rosenthal for diagnostic and hopefully therapeutic bilateral L5-S1 facet joint blocks.  Patient is having chronic low back pain worse with standing better at rest.  He does get some relief with walking.  MRI findings show L5-S1 facet arthropathy without stenosis or central stenosis.  He does have some tingling numbness in the legs but Dr. Ninfa Linden is felt like this is related to longstanding insulin-dependent diabetes.  He actually is off insulin now and has lost quite a bit of weight and had issues with morbid obesity.  He does have some cardiac issues as well.  We will complete diagnostic facet joint blocks with goal towards looking at may be radiofrequency ablation after double block paradigm if it is successful.  Continue with activity modification and physical therapy.  Medication management could include nerve membrane stabilizing medications.  ROS Otherwise per HPI.  Assessment & Plan: Visit Diagnoses:  1. Spondylosis without myelopathy or radiculopathy, lumbar region     Plan: No additional findings.   Meds & Orders:  Meds ordered this encounter  Medications  . methylPREDNISolone acetate (DEPO-MEDROL) injection 80 mg    Orders Placed This Encounter  Procedures  . Facet Injection  . XR C-ARM NO REPORT    Follow-up: Return if symptoms worsen or fail to improve.   Procedures: No procedures performed  Lumbar Facet Joint Intra-Articular Injection(s) with Fluoroscopic Guidance  Patient: [place]      Date of Birth: 12-24-1970 MRN: [id number] PCP: Gildardo Pounds, NP      Visit Date:  03/10/2019   Universal Protocol:    Date/Time: 03/10/2019  Consent Given By: the patient  Position: PRONE   Additional Comments: Vital signs were monitored before and after the procedure. Patient was prepped and draped in the usual sterile fashion. The correct patient, procedure, and site was verified.   Injection Procedure Details:  Procedure Site One Meds Administered:  Meds ordered this encounter  Medications  . methylPREDNISolone acetate (DEPO-MEDROL) injection 80 mg     Laterality: Bilateral  Location/Site:  L5-S1  Needle size: 22 guage  Needle type: Spinal  Needle Placement: Articular  Findings:  -Comments: Excellent flow of contrast producing a partial arthrogram.  Procedure Details: The fluoroscope beam is vertically oriented in AP, and the inferior recess is visualized beneath the lower pole of the inferior apophyseal process, which represents the target point for needle insertion. When direct visualization is difficult the target point is located at the medial projection of the vertebral pedicle. The region overlying each aforementioned target is locally anesthetized with a 1 to 2 ml. volume of 1% Lidocaine without Epinephrine.   The spinal needle was inserted into each of the above mentioned facet joints using biplanar fluoroscopic guidance. A 0.25 to 0.5 ml. volume of Isovue-250 was injected and a partial facet joint arthrogram was obtained. A single spot film was obtained of the resulting arthrogram.    One to 1.25 ml of the steroid/anesthetic solution was then injected into each of the facet joints noted above.   Additional  Comments:  The patient tolerated the procedure well Dressing: 2 x 2 sterile gauze and Band-Aid    Post-procedure details: Patient was observed during the procedure. Post-procedure instructions were reviewed.  Patient left the clinic in stable condition.    Clinical History: MRI LUMBAR SPINE WITHOUT CONTRAST  TECHNIQUE:  Multiplanar, multisequence MR imaging of the lumbar spine was performed. No intravenous contrast was administered.  COMPARISON:  Lumbar spine x-rays dated July 08, 2018. MRI lumbar spine dated August 19, 2013.  FINDINGS: Segmentation:  Standard.  Alignment:  Physiologic.  Vertebrae: No fracture, evidence of discitis, or suspicious bone lesion. L1 hemangioma. Focal degenerative endplate marrow edema along the left aspect of the T12 anterior inferior endplate.  Conus medullaris and cauda equina: Conus extends to the L1 level. Conus and cauda equina appear normal.  Paraspinal and other soft tissues: Negative.  Disc levels:  T12-L1:  Negative.  L1-L2:  Negative.  L2-L3:  New minimal disc bulging.  No stenosis.  L3-L4:  New minimal disc bulging.  No stenosis.  L4-L5:  Negative.  L5-S1: Negative disc. Mild bilateral facet arthropathy. No stenosis.  IMPRESSION: 1. Minimal lumbar spondylosis.  No stenosis or impingement.   Electronically Signed   By: Titus Dubin M.D.   On: 10/31/2018 15:51     Objective:  VS:  HT:    WT:   BMI:     BP:(!) 140/94  HR:83bpm  TEMP: ( )  RESP:  Physical Exam  Ortho Exam Imaging: No results found.

## 2019-05-12 ENCOUNTER — Other Ambulatory Visit: Payer: Self-pay | Admitting: Nurse Practitioner

## 2019-05-12 DIAGNOSIS — I5031 Acute diastolic (congestive) heart failure: Secondary | ICD-10-CM

## 2019-05-12 DIAGNOSIS — E119 Type 2 diabetes mellitus without complications: Secondary | ICD-10-CM

## 2019-05-12 DIAGNOSIS — J42 Unspecified chronic bronchitis: Secondary | ICD-10-CM

## 2019-05-13 ENCOUNTER — Other Ambulatory Visit: Payer: Self-pay | Admitting: Nurse Practitioner

## 2019-05-13 DIAGNOSIS — E119 Type 2 diabetes mellitus without complications: Secondary | ICD-10-CM

## 2019-05-13 DIAGNOSIS — I10 Essential (primary) hypertension: Secondary | ICD-10-CM

## 2019-05-13 DIAGNOSIS — E782 Mixed hyperlipidemia: Secondary | ICD-10-CM

## 2019-05-13 DIAGNOSIS — M1A9XX0 Chronic gout, unspecified, without tophus (tophi): Secondary | ICD-10-CM

## 2019-05-14 NOTE — Telephone Encounter (Signed)
Patient contacted to schedule 3 month FU. Please fill medications if appropriate

## 2019-05-27 ENCOUNTER — Ambulatory Visit: Payer: Medicaid Other | Admitting: Nurse Practitioner

## 2019-06-08 ENCOUNTER — Other Ambulatory Visit: Payer: Self-pay | Admitting: Family Medicine

## 2019-06-08 DIAGNOSIS — E119 Type 2 diabetes mellitus without complications: Secondary | ICD-10-CM

## 2019-07-05 ENCOUNTER — Other Ambulatory Visit: Payer: Self-pay | Admitting: Family Medicine

## 2019-07-05 ENCOUNTER — Other Ambulatory Visit: Payer: Self-pay | Admitting: Nurse Practitioner

## 2019-07-05 DIAGNOSIS — J42 Unspecified chronic bronchitis: Secondary | ICD-10-CM

## 2019-07-05 DIAGNOSIS — I5031 Acute diastolic (congestive) heart failure: Secondary | ICD-10-CM

## 2019-07-05 DIAGNOSIS — E119 Type 2 diabetes mellitus without complications: Secondary | ICD-10-CM

## 2019-07-05 DIAGNOSIS — E782 Mixed hyperlipidemia: Secondary | ICD-10-CM

## 2019-07-09 ENCOUNTER — Other Ambulatory Visit: Payer: Self-pay

## 2019-07-09 DIAGNOSIS — J42 Unspecified chronic bronchitis: Secondary | ICD-10-CM

## 2019-07-09 DIAGNOSIS — K219 Gastro-esophageal reflux disease without esophagitis: Secondary | ICD-10-CM

## 2019-07-09 DIAGNOSIS — E119 Type 2 diabetes mellitus without complications: Secondary | ICD-10-CM

## 2019-07-09 DIAGNOSIS — G894 Chronic pain syndrome: Secondary | ICD-10-CM

## 2019-07-09 DIAGNOSIS — E782 Mixed hyperlipidemia: Secondary | ICD-10-CM

## 2019-07-09 DIAGNOSIS — I10 Essential (primary) hypertension: Secondary | ICD-10-CM

## 2019-07-09 DIAGNOSIS — I5031 Acute diastolic (congestive) heart failure: Secondary | ICD-10-CM

## 2019-07-09 DIAGNOSIS — M1A9XX0 Chronic gout, unspecified, without tophus (tophi): Secondary | ICD-10-CM

## 2019-07-09 MED ORDER — OMEGA-3-ACID ETHYL ESTERS 1 G PO CAPS: 1.0000 g | ORAL_CAPSULE | Freq: Two times a day (BID) | ORAL | 1 refills | Status: DC

## 2019-07-09 MED ORDER — AMLODIPINE BESYLATE 5 MG PO TABS: 5.0000 mg | ORAL_TABLET | Freq: Every day | ORAL | 1 refills | Status: DC

## 2019-07-09 MED ORDER — FUROSEMIDE 40 MG PO TABS: 40.0000 mg | ORAL_TABLET | Freq: Every day | ORAL | 0 refills | Status: DC

## 2019-07-09 MED ORDER — DULOXETINE HCL 60 MG PO CPEP: 60.0000 mg | ORAL_CAPSULE | Freq: Every day | ORAL | 1 refills | Status: DC

## 2019-07-09 MED ORDER — METOPROLOL TARTRATE 50 MG PO TABS: 50.0000 mg | ORAL_TABLET | Freq: Two times a day (BID) | ORAL | 1 refills | Status: DC

## 2019-07-09 MED ORDER — GABAPENTIN 300 MG PO CAPS: 600.0000 mg | ORAL_CAPSULE | Freq: Three times a day (TID) | ORAL | 0 refills | Status: DC

## 2019-07-09 MED ORDER — FAMOTIDINE 20 MG PO TABS: 20.0000 mg | ORAL_TABLET | Freq: Every day | ORAL | 0 refills | Status: DC

## 2019-07-09 MED ORDER — ATORVASTATIN CALCIUM 40 MG PO TABS: 40.0000 mg | ORAL_TABLET | Freq: Every evening | ORAL | 0 refills | Status: DC

## 2019-07-09 MED ORDER — IBUPROFEN 600 MG PO TABS: 600.0000 mg | ORAL_TABLET | Freq: Three times a day (TID) | ORAL | 1 refills | Status: DC | PRN

## 2019-07-09 MED ORDER — PROVENTIL HFA 108 (90 BASE) MCG/ACT IN AERS: INHALATION_SPRAY | RESPIRATORY_TRACT | 0 refills | Status: DC

## 2019-07-09 MED ORDER — ALLOPURINOL 300 MG PO TABS: 300.0000 mg | ORAL_TABLET | Freq: Every day | ORAL | 0 refills | Status: DC

## 2019-07-09 MED ORDER — METFORMIN HCL 1000 MG PO TABS: 500.0000 mg | ORAL_TABLET | Freq: Two times a day (BID) | ORAL | 1 refills | Status: DC

## 2019-07-09 NOTE — Telephone Encounter (Signed)
Pt. Request if he can have enough refill until his next appt. CMA attempt to get patient a sooner appt., but patient prefer in January.

## 2019-07-16 NOTE — Telephone Encounter (Signed)
Attempt to reach patient to inform medication refills has been approved. No answer and LVM.

## 2019-08-18 ENCOUNTER — Encounter: Payer: Self-pay | Admitting: Nurse Practitioner

## 2019-08-18 ENCOUNTER — Other Ambulatory Visit: Payer: Self-pay

## 2019-08-18 ENCOUNTER — Ambulatory Visit: Payer: Medicaid Other | Attending: Nurse Practitioner | Admitting: Nurse Practitioner

## 2019-08-18 VITALS — BP 127/82 | HR 92 | Temp 98.6°F | Ht 74.0 in | Wt 295.0 lb

## 2019-08-18 DIAGNOSIS — Z7982 Long term (current) use of aspirin: Secondary | ICD-10-CM | POA: Diagnosis not present

## 2019-08-18 DIAGNOSIS — F411 Generalized anxiety disorder: Secondary | ICD-10-CM

## 2019-08-18 DIAGNOSIS — I5031 Acute diastolic (congestive) heart failure: Secondary | ICD-10-CM

## 2019-08-18 DIAGNOSIS — K219 Gastro-esophageal reflux disease without esophagitis: Secondary | ICD-10-CM | POA: Insufficient documentation

## 2019-08-18 DIAGNOSIS — R519 Headache, unspecified: Secondary | ICD-10-CM | POA: Diagnosis not present

## 2019-08-18 DIAGNOSIS — I509 Heart failure, unspecified: Secondary | ICD-10-CM | POA: Insufficient documentation

## 2019-08-18 DIAGNOSIS — I110 Hypertensive heart disease with heart failure: Secondary | ICD-10-CM | POA: Diagnosis not present

## 2019-08-18 DIAGNOSIS — E782 Mixed hyperlipidemia: Secondary | ICD-10-CM | POA: Diagnosis not present

## 2019-08-18 DIAGNOSIS — Z79899 Other long term (current) drug therapy: Secondary | ICD-10-CM | POA: Insufficient documentation

## 2019-08-18 DIAGNOSIS — E119 Type 2 diabetes mellitus without complications: Secondary | ICD-10-CM

## 2019-08-18 DIAGNOSIS — J449 Chronic obstructive pulmonary disease, unspecified: Secondary | ICD-10-CM | POA: Insufficient documentation

## 2019-08-18 DIAGNOSIS — R55 Syncope and collapse: Secondary | ICD-10-CM | POA: Insufficient documentation

## 2019-08-18 DIAGNOSIS — M160 Bilateral primary osteoarthritis of hip: Secondary | ICD-10-CM | POA: Diagnosis not present

## 2019-08-18 DIAGNOSIS — Z794 Long term (current) use of insulin: Secondary | ICD-10-CM | POA: Insufficient documentation

## 2019-08-18 DIAGNOSIS — G894 Chronic pain syndrome: Secondary | ICD-10-CM | POA: Diagnosis not present

## 2019-08-18 DIAGNOSIS — E118 Type 2 diabetes mellitus with unspecified complications: Secondary | ICD-10-CM | POA: Diagnosis not present

## 2019-08-18 DIAGNOSIS — I10 Essential (primary) hypertension: Secondary | ICD-10-CM

## 2019-08-18 DIAGNOSIS — F419 Anxiety disorder, unspecified: Secondary | ICD-10-CM | POA: Diagnosis not present

## 2019-08-18 DIAGNOSIS — M1A9XX0 Chronic gout, unspecified, without tophus (tophi): Secondary | ICD-10-CM | POA: Diagnosis not present

## 2019-08-18 DIAGNOSIS — W19XXXD Unspecified fall, subsequent encounter: Secondary | ICD-10-CM

## 2019-08-18 DIAGNOSIS — C44301 Unspecified malignant neoplasm of skin of nose: Secondary | ICD-10-CM

## 2019-08-18 DIAGNOSIS — J42 Unspecified chronic bronchitis: Secondary | ICD-10-CM | POA: Diagnosis not present

## 2019-08-18 DIAGNOSIS — J4522 Mild intermittent asthma with status asthmaticus: Secondary | ICD-10-CM | POA: Diagnosis not present

## 2019-08-18 LAB — POCT GLYCOSYLATED HEMOGLOBIN (HGB A1C): Hemoglobin A1C: 7 % — AB (ref 4.0–5.6)

## 2019-08-18 LAB — GLUCOSE, POCT (MANUAL RESULT ENTRY): POC Glucose: 169 mg/dl — AB (ref 70–99)

## 2019-08-18 MED ORDER — CYCLOBENZAPRINE HCL 10 MG PO TABS: 10.0000 mg | ORAL_TABLET | Freq: Two times a day (BID) | ORAL | 1 refills | Status: DC | PRN

## 2019-08-18 MED ORDER — DULOXETINE HCL 60 MG PO CPEP: 60.0000 mg | ORAL_CAPSULE | Freq: Every day | ORAL | 1 refills | Status: DC

## 2019-08-18 MED ORDER — ACCU-CHEK AVIVA PLUS VI STRP: ORAL_STRIP | 2 refills | Status: DC

## 2019-08-18 MED ORDER — ATORVASTATIN CALCIUM 40 MG PO TABS: 40.0000 mg | ORAL_TABLET | Freq: Every evening | ORAL | 0 refills | Status: DC

## 2019-08-18 MED ORDER — LISINOPRIL 40 MG PO TABS: 40.0000 mg | ORAL_TABLET | Freq: Every day | ORAL | 1 refills | Status: DC

## 2019-08-18 MED ORDER — GABAPENTIN 300 MG PO CAPS: 600.0000 mg | ORAL_CAPSULE | Freq: Three times a day (TID) | ORAL | 0 refills | Status: DC

## 2019-08-18 MED ORDER — AMLODIPINE BESYLATE 5 MG PO TABS: 5.0000 mg | ORAL_TABLET | Freq: Every day | ORAL | 1 refills | Status: DC

## 2019-08-18 MED ORDER — FUROSEMIDE 40 MG PO TABS: 40.0000 mg | ORAL_TABLET | Freq: Every day | ORAL | 0 refills | Status: DC

## 2019-08-18 MED ORDER — COLCHICINE 0.6 MG PO CAPS: ORAL_CAPSULE | ORAL | 3 refills | Status: DC

## 2019-08-18 MED ORDER — HYDROXYZINE HCL 50 MG PO TABS: 50.0000 mg | ORAL_TABLET | Freq: Three times a day (TID) | ORAL | 1 refills | Status: DC | PRN

## 2019-08-18 MED ORDER — FAMOTIDINE 20 MG PO TABS: 20.0000 mg | ORAL_TABLET | Freq: Every day | ORAL | 0 refills | Status: DC

## 2019-08-18 MED ORDER — ALBUTEROL SULFATE HFA 108 (90 BASE) MCG/ACT IN AERS: 2.0000 | INHALATION_SPRAY | Freq: Four times a day (QID) | RESPIRATORY_TRACT | 0 refills | Status: DC | PRN

## 2019-08-18 MED ORDER — ALLOPURINOL 300 MG PO TABS: 300.0000 mg | ORAL_TABLET | Freq: Every day | ORAL | 0 refills | Status: DC

## 2019-08-18 MED ORDER — ALBUTEROL SULFATE (2.5 MG/3ML) 0.083% IN NEBU: 2.5000 mg | INHALATION_SOLUTION | Freq: Four times a day (QID) | RESPIRATORY_TRACT | 1 refills | Status: DC | PRN

## 2019-08-18 MED ORDER — METFORMIN HCL 500 MG PO TABS: 500.0000 mg | ORAL_TABLET | Freq: Two times a day (BID) | ORAL | 1 refills | Status: DC

## 2019-08-18 MED ORDER — OMEGA-3-ACID ETHYL ESTERS 1 G PO CAPS: 1.0000 g | ORAL_CAPSULE | Freq: Two times a day (BID) | ORAL | 1 refills | Status: DC

## 2019-08-18 MED ORDER — METOPROLOL TARTRATE 50 MG PO TABS: 50.0000 mg | ORAL_TABLET | Freq: Two times a day (BID) | ORAL | 1 refills | Status: DC

## 2019-08-18 NOTE — Progress Notes (Signed)
Assessment & Plan:  Preston [name] was seen today for follow-up.  Diagnoses and all orders for this visit:  Type 2 diabetes mellitus treated without insulin (HCC) -     Glucose (CBG) -     HgB A1c -     gabapentin (NEURONTIN) 300 MG capsule; Take 2 capsules (600 mg total) by mouth 3 (three) times daily. -     glucose blood (ACCU-CHEK AVIVA PLUS) test strip; Use as instructed for 3 times daily testing of blood sugar. E11.9 -     metFORMIN (GLUCOPHAGE) 500 MG tablet; Take 1 tablet (500 mg total) by mouth 2 (two) times daily with a meal. Continue blood sugar control as discussed in office today, low carbohydrate diet, and regular physical exercise as tolerated, 150 minutes per week (30 min each day, 5 days per week, or 50 min 3 days per week). Keep blood sugar logs with fasting goal of 90-130 mg/dl, post prandial (after you eat) less than 180.  For Hypoglycemia: BS <60 and Hyperglycemia BS >400; contact the clinic ASAP. Annual eye exams and foot exams are recommended.   Skin cancer of nose -     Ambulatory referral to Dermatology  Syncope, unspecified syncope type -     Ambulatory referral to Cardiology  Chronic bronchitis, unspecified chronic bronchitis type ([place]) -     albuterol (VENTOLIN HFA) 108 (90 Base) MCG/ACT inhaler; Inhale 2 puffs into the lungs every 6 (six) hours as needed for wheezing or shortness of breath (cough). -     albuterol (PROVENTIL) (2.5 MG/3ML) 0.083% nebulizer solution; Take 3 mLs (2.5 mg total) by nebulization every 6 (six) hours as needed for wheezing or shortness of breath.  Chronic gout without tophus, unspecified cause, unspecified site -     allopurinol (ZYLOPRIM) 300 MG tablet; Take 1 tablet (300 mg total) by mouth at bedtime. -     Colchicine 0.6 MG CAPS; Take 1 tablet by mouth daily as needed  Mixed hyperlipidemia Controlled. At goal of <70 -     Lipid panel -     atorvastatin (LIPITOR) 40 MG tablet; Take 1 tablet (40 mg total) by mouth every evening. -      omega-3 acid ethyl esters (LOVAZA) 1 g capsule; Take 1 capsule (1 g total) by mouth 2 (two) times daily. Lab Results  Component Value Date   LDLCALC 48 02/17/2019  INSTRUCTIONS: Work on a low fat, heart healthy diet and participate in regular aerobic exercise program by working out at least 150 minutes per week; 5 days a week-30 minutes per day. Avoid red meat/beef/steak,  fried foods. junk foods, sodas, sugary drinks, unhealthy snacking, alcohol and smoking.  Drink at least 80 oz of water per day and monitor your carbohydrate intake daily.    Chronic pain syndrome -     cyclobenzaprine (FLEXERIL) 10 MG tablet; Take 1 tablet (10 mg total) by mouth 2 (two) times daily as needed for muscle spasms. -     DULoxetine (CYMBALTA) 60 MG capsule; Take 1 capsule (60 mg total) by mouth daily.  Acute diastolic congestive heart failure (HCC) -     furosemide (LASIX) 40 MG tablet; Take 1 tablet (40 mg total) by mouth daily. DASH DIET  Gastroesophageal reflux disease without esophagitis -     famotidine (PEPCID) 20 MG tablet; Take 1 tablet (20 mg total) by mouth daily. INSTRUCTIONS: Avoid GERD Triggers: acidic, spicy or fried foods, caffeine, coffee, sodas,  alcohol and chocolate.   GAD (generalized anxiety disorder) -  hydrOXYzine (ATARAX/VISTARIL) 50 MG tablet; Take 1 tablet (50 mg total) by mouth 3 (three) times daily as needed. for anxiety  Essential hypertension -     CMP14+EGFR -     amLODipine (NORVASC) 5 MG tablet; Take 1 tablet (5 mg total) by mouth at bedtime. -     lisinopril (ZESTRIL) 40 MG tablet; Take 1 tablet (40 mg total) by mouth daily. -     metoprolol tartrate (LOPRESSOR) 50 MG tablet; Take 1 tablet (50 mg total) by mouth 2 (two) times daily. Continue all antihypertensives as prescribed.  Remember to bring in your blood pressure log with you for your follow up appointment.  DASH/Mediterranean Diets are healthier choices for HTN.    Fall, subsequent encounter -     CT Head W  Contrast; Future  Persistent headaches -     CT Head W Contrast; Future    Patient has been counseled on age-appropriate routine health concerns for screening and prevention. These are reviewed and up-to-date. Referrals have been placed accordingly. Immunizations are up-to-date or declined.    Subjective:   Chief Complaint  Patient presents with  . Follow-up    Pt. is here for diabetes follow up.    HPI [place] 49 y.o. male presents to office today for follow up.  has a past medical history of Anxiety, Asthma, Cancer ([place]), CHF (congestive heart failure) ([place]), Chronic cough, Chronic lower back pain, COPD (chronic obstructive pulmonary disease) ([place]), Dyspnea, Edentulous, GERD (gastroesophageal reflux disease), Gout, History of Clostridium difficile infection (2014), Hypercholesterolemia, Hypertension, Insulin dependent diabetes mellitus, Osteoarthritis, Panic attacks, and Shoulder pain, right (04/18/2017).   DM TYPE 2 Weight is up almost 10 lbs. Cites holiday eating as culprit. Checking fasting blood glucose levels daily but not postprandial. I Recommended twice a day readings. A1c up from 6.5 to 7.0 today. Currently taking metformin 500 mg BID. Overdue for eye exam. Referral placed today.  Lab Results  Component Value Date   HGBA1C 7.0 (A) 08/18/2019    Syncope: Patient complains of loss of consciousness. Onset was 5 days ago, with resolved course since that time. Patient describes the episode as a sudden loss of consciousness without warning, lost consciousness for several seconds and episode witnessed and the following was observed: mental status was normal immediately upon regaining consciousness and sudden loss of consciousness without abnormal movements. Patient also has associated symptoms of  coughing. Taking culprit meds?: diuretics States he was sitting at the dinner table with friends. This is all he can recall. Reports after he became conscious his friends told him he  started coughing twice and then passed out and hit his head on the table. Loss of consciousness for several seconds (likley 45 seconds). There was a friend present at that time who is an Designer, fashion/clothing whom he states monitored him while he was unconscious. He did not seek medical attention and today states he has had a persistent right sided headache where his head impacted against the table. Also endorses intermittent lightheadedness and dizziness worse with driving.  He is overdue for eye exam and referral was placed today.  He states this also happened 5 years ago and he was seen in the ED at that time. Went to turn the tv off and cant remember what happened but he woke up on the floor and had hit his head at that time as well. Unclear how long he was unconscious as he was home alone. There is an ED note from 2015 for  fall however it notes that patient stepped on stone and tripped.   Essential Hypertension  Well controlled. He endorses medication compliance taking amlodipine 5 mg daily, metoprolol tartrate 50 mg twice daily and lisinopril 40 mg daily as prescribed. Denies chest pain, shortness of breath, palpitations or BLE edema.  Needs to work on weight and dietary adherence. BP Readings from Last 3 Encounters:  08/18/19 127/82  03/10/19 (!) 140/94  09/08/18 128/86    Review of Systems  Constitutional: Negative for fever, malaise/fatigue and weight loss.  HENT: Negative.  Negative for nosebleeds.   Eyes: Negative.  Negative for blurred vision, double vision and photophobia.  Respiratory: Positive for shortness of breath and wheezing. Negative for cough.   Cardiovascular: Negative.  Negative for chest pain, palpitations and leg swelling.  Gastrointestinal: Positive for heartburn. Negative for nausea and vomiting.  Musculoskeletal: Positive for falls. Negative for myalgias.       CPS  Neurological: Positive for dizziness, loss of consciousness and headaches. Negative for focal weakness and  seizures.       Syncope  Psychiatric/Behavioral: Negative for suicidal ideas. The patient is nervous/anxious.     Past Medical History:  Diagnosis Date  . Anxiety   . Asthma    daily inhaler  . Cancer ([place])    pt. reports diagnosed with skin cancer on face/uncertain type but he reports may be basal cell, pending surgery to remove 09/03/18  . CHF (congestive heart failure) ([place])    no cardiologist  . Chronic cough    states due to COPD  . Chronic lower back pain   . COPD (chronic obstructive pulmonary disease) (HCC)    no home O2  . Dyspnea    with humidity and high pollen count  . Edentulous    upper gum  . GERD (gastroesophageal reflux disease)   . Gout   . History of Clostridium difficile infection 2014  . Hypercholesterolemia   . Hypertension    states under control with med., has been on med. x 4 yr.  . Insulin dependent diabetes mellitus   . Osteoarthritis    bilat. hip  . Panic attacks   . Shoulder pain, right 04/18/2017    Past Surgical History:  Procedure Laterality Date  . COLON SURGERY     14 inches of colon removed  . COLONOSCOPY WITH PROPOFOL N/A 04/01/2013   Procedure: COLONOSCOPY WITH PROPOFOL;  Surgeon: Arta Silence, MD;  Location: [facility] ENDOSCOPY;  Service: Endoscopy;  Laterality: N/A;  . FLEXIBLE SIGMOIDOSCOPY N/A 02/28/2013   Procedure: FLEXIBLE SIGMOIDOSCOPY;  Surgeon: Winfield Cunas., MD;  Location: [facility] ENDOSCOPY;  Service: Endoscopy;  Laterality: N/A;  . LAPAROSCOPIC APPENDECTOMY  07/05/2009  . LAPAROSCOPIC LOW ANTERIOR RESECTION N/A 07/08/2013   Procedure: LAPAROSCOPIC LOW ANTERIOR RESECTION WITH TAKEDOWN OF SPLENIC FLEXURE.;  Surgeon: Adin Hector, MD;  Location: [facility];  Service: General;  Laterality: N/A;  . MANDIBLE SURGERY Left 2010   "dry skin pocket cut out"  . MULTIPLE EXTRACTIONS WITH ALVEOLOPLASTY N/A 04/24/2017   Procedure: SURGICAL REMOVAL OF TEETH NUMBERS `7 20-29 AND 31;  Surgeon: Michael Litter, DMD;  Location: [facility];  Service: Oral Surgery;  Laterality: N/A;  . TOTAL HIP ARTHROPLASTY Left 06/25/2016   Procedure: TOTAL HIP ARTHROPLASTY ANTERIOR APPROACH;  Surgeon: Frederik Pear, MD;  Location: [facility];  Service: Orthopedics;  Laterality: Left;  . TOTAL HIP ARTHROPLASTY Right 09/10/2016   Procedure: TOTAL HIP ARTHROPLASTY ANTERIOR APPROACH;  Surgeon: Frederik Pear, MD;  Location:  [facility];  Service: Orthopedics;  Laterality: Right;    Family History  Problem Relation Age of Onset  . Cancer Father        lymphoma  . Heart attack Mother     Social History Reviewed with no changes to be made today.   Outpatient Medications Prior to Visit  Medication Sig Dispense Refill  . Blood Glucose Monitoring Suppl (ACCU-CHEK AVIVA PLUS) w/Device KIT 1 each by Does not apply route 3 (three) times daily. 1 kit 0  . calcium carbonate (TUMS EX) 750 MG chewable tablet Chew 1 tablet by mouth 3 (three) times daily as needed for heartburn.    [name] ibuprofen (ADVIL) 600 MG tablet Take 1 tablet (600 mg total) by mouth every 8 (eight) hours as needed for mild pain or moderate pain. 30 tablet 1  . allopurinol (ZYLOPRIM) 300 MG tablet Take 1 tablet (300 mg total) by mouth at bedtime. 90 tablet 0  . atorvastatin (LIPITOR) 40 MG tablet Take 1 tablet (40 mg total) by mouth every evening. 90 tablet 0  . Colchicine 0.6 MG CAPS Take 1 tablet by mouth daily as needed 30 capsule 3  . cyclobenzaprine (FLEXERIL) 10 MG tablet Take 1 tablet (10 mg total) by mouth 2 (two) times daily as needed for muscle spasms. 60 tablet 1  . famotidine (PEPCID) 20 MG tablet Take 1 tablet (20 mg total) by mouth daily. 90 tablet 0  . furosemide (LASIX) 40 MG tablet Take 1 tablet (40 mg total) by mouth daily. 60 tablet 0  . gabapentin (NEURONTIN) 300 MG capsule Take 2 capsules (600 mg total) by mouth 3 (three) times daily. 180 capsule 0  . glucose blood (ACCU-CHEK AVIVA PLUS) test strip Use as instructed for 3 times daily testing of blood sugar. E11.9 300 each 2  .  hydrOXYzine (ATARAX/VISTARIL) 50 MG tablet Take 1 tablet (50 mg total) by mouth 3 (three) times daily as needed. for anxiety 90 tablet 1  . lisinopril (ZESTRIL) 40 MG tablet Take 1 tablet by mouth once daily 90 tablet 0  . omega-3 acid ethyl esters (LOVAZA) 1 g capsule Take 1 capsule (1 g total) by mouth 2 (two) times daily. 180 capsule 1  . PROVENTIL HFA 108 (90 Base) MCG/ACT inhaler INHALE 1 TO 2 PUFFS BY MOUTH EVERY 6 HOURS AS NEEDED FOR SHORTNESS OF BREATH FOR COUGH FOR WHEEZING 18 g 0  . aspirin 325 MG EC tablet Take 325 mg by mouth at bedtime.    [name] amLODipine (NORVASC) 5 MG tablet Take 1 tablet (5 mg total) by mouth at bedtime. 30 tablet 1  . DULoxetine (CYMBALTA) 60 MG capsule Take 1 capsule (60 mg total) by mouth daily. 30 capsule 1  . metFORMIN (GLUCOPHAGE) 1000 MG tablet Take 0.5 tablets (500 mg total) by mouth 2 (two) times daily with a meal. Must have office visit for refills. 30 tablet 1  . metoprolol tartrate (LOPRESSOR) 50 MG tablet Take 1 tablet (50 mg total) by mouth 2 (two) times daily. 60 tablet 1  . methylPREDNISolone acetate (DEPO-MEDROL) injection 80 mg      No facility-administered medications prior to visit.    Allergies  Allergen Reactions  . Morphine And Related Itching and Nausea And Vomiting  . Adhesive [Tape] Itching and Rash       Objective:    BP 127/82 (BP Location: Right Arm, Patient Position: Sitting, Cuff Size: Large)   Pulse 92   Temp 98.6 F (37 C) (Oral)   Ht  6' 2"  (1.88 m)   Wt 295 lb (133.8 kg)   SpO2 96%   BMI 37.88 kg/m  Wt Readings from Last 3 Encounters:  08/18/19 295 lb (133.8 kg)  09/08/18 290 lb 9.6 oz (131.8 kg)  08/28/18 286 lb (129.7 kg)    Physical Exam Vitals and nursing note reviewed.  Constitutional:      Appearance: He is well-developed. He is obese.  HENT:     Head: Normocephalic and atraumatic.  Cardiovascular:     Rate and Rhythm: Normal rate and regular rhythm.     Heart sounds: Normal heart sounds. No murmur. No  friction rub. No gallop.   Pulmonary:     Effort: Pulmonary effort is normal. No tachypnea or respiratory distress.     Breath sounds: Normal breath sounds. No decreased breath sounds, wheezing, rhonchi or rales.  Chest:     Chest wall: No tenderness.  Abdominal:     General: Bowel sounds are normal.     Palpations: Abdomen is soft.  Musculoskeletal:        General: Normal range of motion.     Cervical back: Normal range of motion.  Skin:    General: Skin is warm and dry.  Neurological:     Mental Status: He is alert and oriented to person, place, and time.     Coordination: Coordination normal.  Psychiatric:        Behavior: Behavior normal. Behavior is cooperative.        Thought Content: Thought content normal.        Judgment: Judgment normal.          Patient has been counseled extensively about nutrition and exercise as well as the importance of adherence with medications and regular follow-up. The patient was given clear instructions to go to ER or return to medical center if symptoms don't improve, worsen or new problems develop. The patient verbalized understanding.   Follow-up: Return in about 3 months (around 11/16/2019).   Gildardo Pounds, FNP-BC [facility] [place] [place]   08/18/2019, 1:48 PM

## 2019-08-19 LAB — CMP14+EGFR
ALT: 67 IU/L — ABNORMAL HIGH (ref 0–44)
AST: 30 IU/L (ref 0–40)
Albumin/Globulin Ratio: 2.5 — ABNORMAL HIGH (ref 1.2–2.2)
Albumin: 5.4 g/dL — ABNORMAL HIGH (ref 4.0–5.0)
Alkaline Phosphatase: 88 IU/L (ref 39–117)
BUN/Creatinine Ratio: 10 (ref 9–20)
BUN: 7 mg/dL (ref 6–24)
Bilirubin Total: 0.3 mg/dL (ref 0.0–1.2)
CO2: 25 mmol/L (ref 20–29)
Calcium: 10.4 mg/dL — ABNORMAL HIGH (ref 8.7–10.2)
Chloride: 89 mmol/L — ABNORMAL LOW (ref 96–106)
Creatinine, Ser: 0.73 mg/dL — ABNORMAL LOW (ref 0.76–1.27)
GFR calc Af Amer: 128 mL/min/{1.73_m2} (ref >59)
GFR calc non Af Amer: 110 mL/min/{1.73_m2} (ref >59)
Globulin, Total: 2.2 g/dL (ref 1.5–4.5)
Glucose: 120 mg/dL — ABNORMAL HIGH (ref 65–99)
Potassium: 5.1 mmol/L (ref 3.5–5.2)
Sodium: 132 mmol/L — ABNORMAL LOW (ref 134–144)
Total Protein: 7.6 g/dL (ref 6.0–8.5)

## 2019-08-19 LAB — LIPID PANEL
Chol/HDL Ratio: 4.3 ratio (ref 0.0–5.0)
Cholesterol, Total: 164 mg/dL (ref 100–199)
HDL: 38 mg/dL — ABNORMAL LOW (ref >39)
LDL Chol Calc (NIH): 51 mg/dL (ref 0–99)
Triglycerides: 508 mg/dL — ABNORMAL HIGH (ref 0–149)
VLDL Cholesterol Cal: 75 mg/dL — ABNORMAL HIGH (ref 5–40)

## 2019-08-27 ENCOUNTER — Telehealth: Payer: Self-pay

## 2019-08-27 DIAGNOSIS — M109 Gout, unspecified: Secondary | ICD-10-CM

## 2019-08-27 MED ORDER — COLCHICINE 0.6 MG PO CAPS: 0.6000 mg | ORAL_CAPSULE | Freq: Every day | ORAL | 3 refills | Status: DC | PRN

## 2019-08-27 NOTE — Telephone Encounter (Signed)
Done

## 2019-08-27 NOTE — Telephone Encounter (Signed)
Medicaid does not cover Colcrys or Colchicine, only Mitigare-if appropriate please send in script for [employer] to [place] on [facility]

## 2019-09-09 ENCOUNTER — Other Ambulatory Visit: Payer: Self-pay | Admitting: Nurse Practitioner

## 2019-09-09 DIAGNOSIS — I5031 Acute diastolic (congestive) heart failure: Secondary | ICD-10-CM

## 2019-09-22 DIAGNOSIS — L905 Scar conditions and fibrosis of skin: Secondary | ICD-10-CM | POA: Diagnosis not present

## 2019-09-22 DIAGNOSIS — Z85828 Personal history of other malignant neoplasm of skin: Secondary | ICD-10-CM | POA: Diagnosis not present

## 2019-09-22 DIAGNOSIS — D485 Neoplasm of uncertain behavior of skin: Secondary | ICD-10-CM | POA: Diagnosis not present

## 2019-09-30 ENCOUNTER — Encounter: Payer: Self-pay | Admitting: *Deleted

## 2019-09-30 ENCOUNTER — Other Ambulatory Visit: Payer: Self-pay

## 2019-09-30 ENCOUNTER — Encounter: Payer: Self-pay | Admitting: Cardiology

## 2019-09-30 ENCOUNTER — Ambulatory Visit: Payer: Medicaid Other | Admitting: Cardiology

## 2019-09-30 VITALS — BP 126/70 | HR 78 | Ht 74.0 in | Wt 298.4 lb

## 2019-09-30 DIAGNOSIS — I10 Essential (primary) hypertension: Secondary | ICD-10-CM

## 2019-09-30 DIAGNOSIS — R55 Syncope and collapse: Secondary | ICD-10-CM | POA: Diagnosis not present

## 2019-09-30 NOTE — Progress Notes (Signed)
Cardiology Office Note:    Date:  09/30/2019   ID:  Preston [name], DOB 1970/08/26, MRN [id number]  PCP:  Gildardo Pounds, NP  Cardiologist:  Candee Furbish, MD  Electrophysiologist:  None   Referring MD: Gildardo Pounds, NP     History of Present Illness:    Preston [name] is a 49 y.o. male with grade 1 diastolic dysfunction here for follow-up, recent syncopal episode.  Has COPD smoking history hyperlipidemia and anxiety as well as hypertension.  He was hospitalized with shortness of breath possible bronchitis previously.  Echocardiogram in 2019 showed normal EF with grade 1 diastolic dysfunction.  Also recently complained of a syncopal episode where he lost consciousness in January 2021.  Suddenly occurred without warning.  Several seconds duration it was witnessed.  Mental status was normal upon regaining consciousness.  No prodrome.  He was sitting at the dinner table with his friends reports stated that his friends saw him coughing twice and then he passed out and hit the head on the table.  Several seconds lost consciousness.  His friend Designer, fashion/clothing monitored him.  Only had a head ache afterwards after hitting on the table.  Sometimes he may feel some lightheadedness and dizziness with driving.  Had another blackout while sitting with friends, cannot remember if he was coughing.  Sometimes when he swallows food he may cough.  Does remember being quite sweaty after the incident.  No vomiting.  5 years ago he was seen in the emergency department after going to the television to turn it off and woke up on the floor hit his head at that time as well.  Struggled with tobacco cessation.  Past Medical History:  Diagnosis Date  . Anxiety   . Asthma    daily inhaler  . Cancer ([place])    pt. reports diagnosed with skin cancer on face/uncertain type but he reports may be basal cell, pending surgery to remove 09/03/18  . CHF (congestive heart failure) ([place])    no cardiologist  . Chronic  cough    states due to COPD  . Chronic lower back pain   . COPD (chronic obstructive pulmonary disease) (HCC)    no home O2  . Dyspnea    with humidity and high pollen count  . Edentulous    upper gum  . GERD (gastroesophageal reflux disease)   . Gout   . History of Clostridium difficile infection 2014  . Hypercholesterolemia   . Hypertension    states under control with med., has been on med. x 4 yr.  . Insulin dependent diabetes mellitus   . Osteoarthritis    bilat. hip  . Panic attacks   . Shoulder pain, right 04/18/2017    Past Surgical History:  Procedure Laterality Date  . COLON SURGERY     14 inches of colon removed  . COLONOSCOPY WITH PROPOFOL N/A 04/01/2013   Procedure: COLONOSCOPY WITH PROPOFOL;  Surgeon: Arta Silence, MD;  Location: [facility];  Service: Endoscopy;  Laterality: N/A;  . FLEXIBLE SIGMOIDOSCOPY N/A 02/28/2013   Procedure: FLEXIBLE SIGMOIDOSCOPY;  Surgeon: Winfield Cunas., MD;  Location[facility];  Service: Endoscopy;  Laterality: N/A;  . LAPAROSCOPIC APPENDECTOMY  07/05/2009  . LAPAROSCOPIC LOW ANTERIOR RESECTION N/A 07/08/2013   Procedure: LAPAROSCOPIC LOW ANTERIOR RESECTION WITH TAKEDOWN OF SPLENIC FLEXURE.;  Surgeon: Adin Hector, MD;  Location: [place];  Service: General;  Laterality: N/A;  . MANDIBLE SURGERY Left 2010   "dry skin pocket  cut out"  . MULTIPLE EXTRACTIONS WITH ALVEOLOPLASTY N/A 04/24/2017   Procedure: SURGICAL REMOVAL OF TEETH NUMBERS `7 20-29 AND 31;  Surgeon: Michael Litter, DMD;  Location: [place];  Service: Oral Surgery;  Laterality: N/A;  . TOTAL HIP ARTHROPLASTY Left 06/25/2016   Procedure: TOTAL HIP ARTHROPLASTY ANTERIOR APPROACH;  Surgeon: Frederik Pear, MD;  Location: [place];  Service: Orthopedics;  Laterality: Left;  . TOTAL HIP ARTHROPLASTY Right 09/10/2016   Procedure: TOTAL HIP ARTHROPLASTY ANTERIOR APPROACH;  Surgeon: Frederik Pear, MD;  Location: [place];  Service: Orthopedics;  Laterality: Right;     Current Medications: Current Meds  Medication Sig  . albuterol (PROVENTIL) (2.5 MG/3ML) 0.083% nebulizer solution Take 3 mLs (2.5 mg total) by nebulization every 6 (six) hours as needed for wheezing or shortness of breath.  [name] albuterol (VENTOLIN HFA) 108 (90 Base) MCG/ACT inhaler Inhale 2 puffs into the lungs every 6 (six) hours as needed for wheezing or shortness of breath (cough).  [name] allopurinol (ZYLOPRIM) 300 MG tablet Take 1 tablet (300 mg total) by mouth at bedtime.  [name] amLODipine (NORVASC) 5 MG tablet Take 1 tablet (5 mg total) by mouth at bedtime.  [name] atorvastatin (LIPITOR) 40 MG tablet Take 1 tablet (40 mg total) by mouth every evening.  . Blood Glucose Monitoring Suppl (ACCU-CHEK AVIVA PLUS) w/Device KIT 1 each by Does not apply route 3 (three) times daily.  . calcium carbonate (TUMS EX) 750 MG chewable tablet Chew 1 tablet by mouth 3 (three) times daily as needed for heartburn.  . Colchicine (MITIGARE) 0.6 MG CAPS Take 0.6 mg by mouth daily as needed. M10.9  . cyclobenzaprine (FLEXERIL) 10 MG tablet Take 1 tablet (10 mg total) by mouth 2 (two) times daily as needed for muscle spasms.  . DULoxetine (CYMBALTA) 60 MG capsule Take 1 capsule (60 mg total) by mouth daily.  [name] gabapentin (NEURONTIN) 300 MG capsule Take 2 capsules (600 mg total) by mouth 3 (three) times daily.  [name] glucose blood (ACCU-CHEK AVIVA PLUS) test strip Use as instructed for 3 times daily testing of blood sugar. E11.9  . hydrOXYzine (ATARAX/VISTARIL) 50 MG tablet Take 1 tablet (50 mg total) by mouth 3 (three) times daily as needed. for anxiety  . ibuprofen (ADVIL) 600 MG tablet Take 1 tablet (600 mg total) by mouth every 8 (eight) hours as needed for mild pain or moderate pain.  [name] lisinopril (ZESTRIL) 40 MG tablet Take 1 tablet (40 mg total) by mouth daily.  . metFORMIN (GLUCOPHAGE) 500 MG tablet Take 1 tablet (500 mg total) by mouth 2 (two) times daily with a meal.  . omega-3 acid ethyl esters (LOVAZA) 1 g capsule  Take 1 capsule (1 g total) by mouth 2 (two) times daily.  . [DISCONTINUED] aspirin 325 MG EC tablet Take 325 mg by mouth at bedtime.  . [DISCONTINUED] famotidine (PEPCID) 20 MG tablet Take 1 tablet (20 mg total) by mouth daily.  . [DISCONTINUED] furosemide (LASIX) 40 MG tablet Take 1 tablet by mouth once daily     Allergies:   Morphine and related and Adhesive [tape]   Social History   Socioeconomic History  . Marital status: Single    Spouse name: Not on file  . Number of children: Not on file  . Years of education: Not on file  . Highest education level: Not on file  Occupational History  . Not on file  Tobacco Use  . Smoking status: Current Every Day Smoker    Packs/day:  0.00    Last attempt to quit: 06/11/2016    Years since quitting: 3.3  . Smokeless tobacco: Former Systems developer    Types: Chew  Substance and Sexual Activity  . Alcohol use: Yes    Comment: 3-6 drinks/day  . Drug use: No  . Sexual activity: Yes  Other Topics Concern  . Not on file  Social History Narrative  . Not on file   Social Determinants of Health   Financial Resource Strain:   . Difficulty of Paying Living Expenses: Not on file  Food Insecurity:   . Worried About Charity fundraiser in the Last Year: Not on file  . Ran Out of Food in the Last Year: Not on file  Transportation Needs:   . Lack of Transportation (Medical): Not on file  . Lack of Transportation (Non-Medical): Not on file  Physical Activity:   . Days of Exercise per Week: Not on file  . Minutes of Exercise per Session: Not on file  Stress:   . Feeling of Stress : Not on file  Social Connections:   . Frequency of Communication with Friends and Family: Not on file  . Frequency of Social Gatherings with Friends and Family: Not on file  . Attends Religious Services: Not on file  . Active Member of Clubs or Organizations: Not on file  . Attends Archivist Meetings: Not on file  . Marital Status: Not on file     Family  History: The patient's family history includes Cancer in his father; Heart attack in his mother.  ROS:   Please see the history of present illness.     All other systems reviewed and are negative.  EKGs/Labs/Other Studies Reviewed:    The following studies were reviewed today: Prior echo  EKG:  EKG is  ordered today.  The ekg ordered today demonstrates sinus rhythm 78 no other changes.  Normal intervals.  Normal QT.  Recent Labs: 02/17/2019: Hemoglobin 14.7; Platelets 321 08/18/2019: ALT 67; BUN 7; Creatinine, Ser 0.73; Potassium 5.1; Sodium 132  Recent Lipid Panel    Component Value Date/Time   CHOL 164 08/18/2019 1217   TRIG 508 (H) 08/18/2019 1217   HDL 38 (L) 08/18/2019 1217   CHOLHDL 4.3 08/18/2019 1217   CHOLHDL 5.6 12/13/2016 0605   VLDL UNABLE TO CALCULATE IF TRIGLYCERIDE OVER 400 mg/dL 12/13/2016 0605   LDLCALC 51 08/18/2019 1217    Physical Exam:    VS:  BP 126/70   Pulse 78   Ht 6' 2"  (1.88 m)   Wt 298 lb 6.4 oz (135.4 kg)   SpO2 95%   BMI 38.31 kg/m     Wt Readings from Last 3 Encounters:  09/30/19 298 lb 6.4 oz (135.4 kg)  08/18/19 295 lb (133.8 kg)  09/08/18 290 lb 9.6 oz (131.8 kg)     GEN:  Well nourished, well developed in no acute distress HEENT: Normal NECK: No JVD; No carotid bruits LYMPHATICS: No lymphadenopathy CARDIAC: RRR, no murmurs, rubs, gallops RESPIRATORY:  Clear to auscultation without rales, wheezing or rhonchi  ABDOMEN: Soft, non-tender, non-distended MUSCULOSKELETAL:  No edema; No deformity  SKIN: Warm and dry NEUROLOGIC:  Alert and oriented x 3 PSYCHIATRIC:  Normal affect   ASSESSMENT:    1. Syncope, unspecified syncope type   2. Essential hypertension   3. Morbid obesity ([address])    PLAN:    In order of problems listed above:  Syncope -First episode sounds like cough related syncope, which can  result in a hyper vagal response.  Nonetheless, I think that a Zio patch monitor for 14 days would be helpful.  We will order.   I will recheck his echocardiogram to ensure proper structure and function since it has been about 2 years.  We will go ahead and stop his Lasix as dehydration can contribute to the symptoms.  Continue to hydrate well.  He does not know whether or not he was coughing around the second episode.  Nonetheless we will monitor him.  No driving for 6 months.  Discussed with him.  He states that he does not drive.  He is also getting further neurologic work-up with his primary doctor.  Grade 1 diastolic dysfunction -Both result of aging, weight, smoking history resulting in some stiffness of his left ventricle.  I think that we should stop his Lasix given his recent cough related syncopal episode and prior dizziness when getting up from a seated position.  Does not have high-grade diastolic dysfunction. -Strongly encourage conditioning efforts continued with weight reduction.  His lower extremity edema has improved. -Prevention includes statins.  Morbid obesity -BMI greater than 35 with 2 or more comorbidities.  Continue to encourage weight loss.  Carb reduction.  Tobacco use -Continue to encourage tobacco cessation.  Diabetes with hypertension -On gabapentin for peripheral neuropathy Metformin. -On ACE inhibitor, amlodipine.  I am going to stop his Lasix.  Skin cancer on nose -Dermatology will be removing once again.  75-monthfollow-up  Medication Adjustments/Labs and Tests Ordered: Current medicines are reviewed at length with the patient today.  Concerns regarding medicines are outlined above.  Orders Placed This Encounter  Procedures  . LONG TERM MONITOR (3-14 DAYS)  . EKG 12-Lead  . ECHOCARDIOGRAM COMPLETE   No orders of the defined types were placed in this encounter.   Patient Instructions  Medication Instructions:  Please discontinue your Furosemide. Continue all other medications as listed.  *If you need a refill on your cardiac medications before your next appointment, please  call your pharmacy*  Testing/Procedures: Your physician has requested that you have an echocardiogram. Echocardiography is a painless test that uses sound waves to create images of your heart. It provides your doctor with information about the size and shape of your heart and how well your heart's chambers and valves are working. This procedure takes approximately one hour. There are no restrictions for this procedure.  ZIO XT- Long Term Monitor Instructions   Your physician has requested you wear your ZIO patch monitor 14 days.   This is a single patch monitor.  [employer] supplies one patch monitor per enrollment.  Additional stickers are not available.   Please do not apply patch if you will be having a Nuclear Stress Test, Echocardiogram, Cardiac CT, MRI, or Chest Xray during the time frame you would be wearing the monitor. The patch cannot be worn during these tests.  You cannot remove and re-apply the ZIO XT patch monitor.   Your ZIO patch monitor will be [facility] your home address. The monitor may also be mailed to a PO BOX if home delivery is not available.   It may take 3-5 days to receive your monitor after you have been enrolled.   Once you have received you monitor, please review enclosed instructions.  Your monitor has already been registered assigning a specific monitor serial # to you.   Applying the monitor   Shave hair from upper left chest.  Hold abrader disc by orange tab.  Rub abrader in 40 strokes over left upper chest as indicated in your monitor instructions.   Clean area with 4 enclosed alcohol pads .  Use all pads to assure are is cleaned thoroughly.  Let dry.   Apply patch as indicated in monitor instructions.  Patch will be place under collarbone on left side of chest with arrow pointing upward.   Rub patch adhesive wings for 2 minutes.Remove white label marked "1".  Remove white label marked "2".  Rub patch  adhesive wings for 2 additional minutes.   While looking in a mirror, press and release button in center of patch.  A small green light will flash 3-4 times .  This will be your only indicator the monitor has been turned on.     Do not shower for the first 24 hours.  You may shower after the first 24 hours.   Press button if you feel a symptom. You will hear a small click.  Record Date, Time and Symptom in the Patient Log Book.   When you are ready to remove patch, follow instructions on last 2 pages of Patient Log Book.  Stick patch monitor onto last page of Patient Log Book.   Place Patient Log Book in [place] box.  Use locking tab on box and tape box closed securely.  The [employer] and [employer] has [employer] on it.  Please place in mailbox as soon as possible.  Your physician should have your test results approximately 7 days after the monitor has been mailed back to [facility].   Call [place] at [phone] if you have questions regarding your ZIO XT patch monitor.  Call them immediately if you see an orange light blinking on your monitor.   If your monitor falls off in less than 4 days contact our Monitor department at [phone].  If your monitor becomes loose or falls off after 4 days call [employer] at [phone] for suggestions on securing your monitor.   Follow-Up: At [facility], you and your health needs are our priority.  As part of our continuing mission to provide you with exceptional heart care, we have created designated Provider Care Teams.  These Care Teams include your primary Cardiologist (physician) and Advanced Practice Providers (APPs -  Physician Assistants and Nurse Practitioners) who all work together to provide you with the care you need, when you need it.  We recommend signing up for the patient portal called "MyChart".  Sign up information is provided on this After Visit Summary.  MyChart is used to connect with patients for Virtual  Visits (Telemedicine).  Patients are able to view lab/test results, encounter notes, upcoming appointments, etc.  Non-urgent messages can be sent to your provider as well.   To learn more about what you can do with MyChart, go to [email or web address].    Your next appointment:   2 month(s)  The format for your next appointment:   In Person  Provider:   Candee Furbish, MD   Thank you for choosing [facility]!!         Signed, Candee Furbish, MD  09/30/2019 11:50 AM    [place]

## 2019-09-30 NOTE — Progress Notes (Signed)
Patient ID: Preston [name], male   DOB: 13-Oct-1970, 49 y.o.   MRN: [id number] Patient enrolled for [employer] to mail a 14 day ZIO XT long term holter monitor to his home.

## 2019-09-30 NOTE — Patient Instructions (Signed)
Medication Instructions:  Please discontinue your Furosemide. Continue all other medications as listed.  *If you need a refill on your cardiac medications before your next appointment, please call your pharmacy*  Testing/Procedures: Your physician has requested that you have an echocardiogram. Echocardiography is a painless test that uses sound waves to create images of your heart. It provides your doctor with information about the size and shape of your heart and how well your heart's chambers and valves are working. This procedure takes approximately one hour. There are no restrictions for this procedure.  ZIO XT- Long Term Monitor Instructions   Your physician has requested you wear your ZIO patch monitor 14 days.   This is a single patch monitor.  [employer] supplies one patch monitor per enrollment.  Additional stickers are not available.   Please do not apply patch if you will be having a Nuclear Stress Test, Echocardiogram, Cardiac CT, MRI, or Chest Xray during the time frame you would be wearing the monitor. The patch cannot be worn during these tests.  You cannot remove and re-apply the ZIO XT patch monitor.   Your ZIO patch monitor will be sent USPS Priority mail from [facility] directly to your home address. The monitor may also be mailed to a PO BOX if home delivery is not available.   It may take 3-5 days to receive your monitor after you have been enrolled.   Once you have received you monitor, please review enclosed instructions.  Your monitor has already been registered assigning a specific monitor serial # to you.   Applying the monitor   Shave hair from upper left chest.   Hold abrader disc by orange tab.  Rub abrader in 40 strokes over left upper chest as indicated in your monitor instructions.   Clean area with 4 enclosed alcohol pads .  Use all pads to assure are is cleaned thoroughly.  Let dry.   Apply patch as indicated in monitor instructions.  Patch will  be place under collarbone on left side of chest with arrow pointing upward.   Rub patch adhesive wings for 2 minutes.Remove white label marked "1".  Remove white label marked "2".  Rub patch adhesive wings for 2 additional minutes.   While looking in a mirror, press and release button in center of patch.  A small green light will flash 3-4 times .  This will be your only indicator the monitor has been turned on.     Do not shower for the first 24 hours.  You may shower after the first 24 hours.   Press button if you feel a symptom. You will hear a small click.  Record Date, Time and Symptom in the Patient Log Book.   When you are ready to remove patch, follow instructions on last 2 pages of Patient Log Book.  Stick patch monitor onto last page of Patient Log Book.   Place Patient Log Book in [place] box.  Use locking tab on box and tape box closed securely.  The [employer] has [employer] on it.  Please place in mailbox as soon as possible.  Your physician should have your test results approximately 7 days after the monitor has been mailed back to [facility].   Call [place] at [phone] if you have questions regarding your ZIO XT patch monitor.  Call them immediately if you see an orange light blinking on your monitor.   If your monitor falls off in less than  4 days contact our Monitor department at [phone].  If your monitor becomes loose or falls off after 4 days call [employer] at [phone] for suggestions on securing your monitor.   Follow-Up: At [facility], you and your health needs are our priority.  As part of our continuing mission to provide you with exceptional heart care, we have created designated Provider Care Teams.  These Care Teams include your primary Cardiologist (physician) and Advanced Practice Providers (APPs -  Physician Assistants and Nurse Practitioners) who all work together to provide you with the care you need, when you  need it.  We recommend signing up for the patient portal called "MyChart".  Sign up information is provided on this After Visit Summary.  MyChart is used to connect with patients for Virtual Visits (Telemedicine).  Patients are able to view lab/test results, encounter notes, upcoming appointments, etc.  Non-urgent messages can be sent to your provider as well.   To learn more about what you can do with MyChart, go to [email or web address].    Your next appointment:   2 month(s)  The format for your next appointment:   In Person  Provider:   Candee Furbish, MD   Thank you for choosing [facility]!!

## 2019-10-07 ENCOUNTER — Other Ambulatory Visit: Payer: Self-pay | Admitting: Nurse Practitioner

## 2019-10-07 ENCOUNTER — Ambulatory Visit (INDEPENDENT_AMBULATORY_CARE_PROVIDER_SITE_OTHER): Payer: Medicaid Other

## 2019-10-07 DIAGNOSIS — E119 Type 2 diabetes mellitus without complications: Secondary | ICD-10-CM

## 2019-10-07 DIAGNOSIS — R55 Syncope and collapse: Secondary | ICD-10-CM | POA: Diagnosis not present

## 2019-10-13 DIAGNOSIS — D489 Neoplasm of uncertain behavior, unspecified: Secondary | ICD-10-CM | POA: Diagnosis not present

## 2019-10-13 NOTE — Telephone Encounter (Signed)
Patients wife called and requested for listed medication to refilled and sent to [facility] [place].   Gabapentin 300mg 

## 2019-10-14 NOTE — Telephone Encounter (Signed)
Will route to PCP for medication refill.

## 2019-10-20 ENCOUNTER — Other Ambulatory Visit (HOSPITAL_COMMUNITY): Payer: Medicaid Other

## 2019-10-20 ENCOUNTER — Other Ambulatory Visit: Payer: Self-pay | Admitting: Nurse Practitioner

## 2019-10-20 DIAGNOSIS — I10 Essential (primary) hypertension: Secondary | ICD-10-CM

## 2019-10-20 DIAGNOSIS — H9203 Otalgia, bilateral: Secondary | ICD-10-CM | POA: Diagnosis not present

## 2019-10-20 DIAGNOSIS — Z4802 Encounter for removal of sutures: Secondary | ICD-10-CM | POA: Diagnosis not present

## 2019-10-20 DIAGNOSIS — H6123 Impacted cerumen, bilateral: Secondary | ICD-10-CM | POA: Diagnosis not present

## 2019-10-22 DIAGNOSIS — L905 Scar conditions and fibrosis of skin: Secondary | ICD-10-CM | POA: Diagnosis not present

## 2019-10-28 ENCOUNTER — Other Ambulatory Visit: Payer: Self-pay

## 2019-10-28 ENCOUNTER — Ambulatory Visit (HOSPITAL_COMMUNITY): Payer: Medicaid Other | Attending: Cardiovascular Disease

## 2019-10-28 DIAGNOSIS — R55 Syncope and collapse: Secondary | ICD-10-CM

## 2019-10-28 DIAGNOSIS — I10 Essential (primary) hypertension: Secondary | ICD-10-CM | POA: Insufficient documentation

## 2019-11-14 ENCOUNTER — Other Ambulatory Visit: Payer: Self-pay | Admitting: Nurse Practitioner

## 2019-11-14 DIAGNOSIS — E782 Mixed hyperlipidemia: Secondary | ICD-10-CM

## 2019-11-17 ENCOUNTER — Encounter: Payer: Self-pay | Admitting: Nurse Practitioner

## 2019-11-17 ENCOUNTER — Other Ambulatory Visit: Payer: Self-pay

## 2019-11-17 ENCOUNTER — Ambulatory Visit: Payer: Medicaid Other | Attending: Nurse Practitioner | Admitting: Nurse Practitioner

## 2019-11-17 VITALS — BP 135/92 | HR 81 | Temp 97.7°F | Ht 74.0 in | Wt 301.0 lb

## 2019-11-17 DIAGNOSIS — J449 Chronic obstructive pulmonary disease, unspecified: Secondary | ICD-10-CM | POA: Diagnosis not present

## 2019-11-17 DIAGNOSIS — Z794 Long term (current) use of insulin: Secondary | ICD-10-CM | POA: Insufficient documentation

## 2019-11-17 DIAGNOSIS — M109 Gout, unspecified: Secondary | ICD-10-CM | POA: Insufficient documentation

## 2019-11-17 DIAGNOSIS — M1990 Unspecified osteoarthritis, unspecified site: Secondary | ICD-10-CM | POA: Diagnosis not present

## 2019-11-17 DIAGNOSIS — E782 Mixed hyperlipidemia: Secondary | ICD-10-CM | POA: Diagnosis not present

## 2019-11-17 DIAGNOSIS — I110 Hypertensive heart disease with heart failure: Secondary | ICD-10-CM | POA: Insufficient documentation

## 2019-11-17 DIAGNOSIS — I10 Essential (primary) hypertension: Secondary | ICD-10-CM

## 2019-11-17 DIAGNOSIS — E1142 Type 2 diabetes mellitus with diabetic polyneuropathy: Secondary | ICD-10-CM | POA: Diagnosis not present

## 2019-11-17 DIAGNOSIS — M1A9XX0 Chronic gout, unspecified, without tophus (tophi): Secondary | ICD-10-CM

## 2019-11-17 DIAGNOSIS — G894 Chronic pain syndrome: Secondary | ICD-10-CM

## 2019-11-17 DIAGNOSIS — I509 Heart failure, unspecified: Secondary | ICD-10-CM | POA: Insufficient documentation

## 2019-11-17 DIAGNOSIS — F419 Anxiety disorder, unspecified: Secondary | ICD-10-CM | POA: Diagnosis not present

## 2019-11-17 DIAGNOSIS — E119 Type 2 diabetes mellitus without complications: Secondary | ICD-10-CM

## 2019-11-17 DIAGNOSIS — Z7901 Long term (current) use of anticoagulants: Secondary | ICD-10-CM | POA: Diagnosis not present

## 2019-11-17 DIAGNOSIS — F411 Generalized anxiety disorder: Secondary | ICD-10-CM

## 2019-11-17 DIAGNOSIS — Z8249 Family history of ischemic heart disease and other diseases of the circulatory system: Secondary | ICD-10-CM | POA: Diagnosis not present

## 2019-11-17 DIAGNOSIS — Z79899 Other long term (current) drug therapy: Secondary | ICD-10-CM | POA: Insufficient documentation

## 2019-11-17 LAB — GLUCOSE, POCT (MANUAL RESULT ENTRY): POC Glucose: 189 mg/dl — AB (ref 70–99)

## 2019-11-17 MED ORDER — OMEGA-3-ACID ETHYL ESTERS 1 G PO CAPS: 1.0000 g | ORAL_CAPSULE | Freq: Two times a day (BID) | ORAL | 1 refills | Status: DC

## 2019-11-17 MED ORDER — METOPROLOL TARTRATE 50 MG PO TABS: 50.0000 mg | ORAL_TABLET | Freq: Two times a day (BID) | ORAL | 1 refills | Status: DC

## 2019-11-17 MED ORDER — METFORMIN HCL 500 MG PO TABS: 500.0000 mg | ORAL_TABLET | Freq: Two times a day (BID) | ORAL | 1 refills | Status: DC

## 2019-11-17 MED ORDER — HYDROXYZINE HCL 50 MG PO TABS: 50.0000 mg | ORAL_TABLET | Freq: Three times a day (TID) | ORAL | 1 refills | Status: DC | PRN

## 2019-11-17 MED ORDER — DULOXETINE HCL 60 MG PO CPEP: 60.0000 mg | ORAL_CAPSULE | Freq: Every day | ORAL | 1 refills | Status: DC

## 2019-11-17 MED ORDER — AMLODIPINE BESYLATE 5 MG PO TABS: 5.0000 mg | ORAL_TABLET | Freq: Every day | ORAL | 1 refills | Status: DC

## 2019-11-17 MED ORDER — ACCU-CHEK AVIVA PLUS VI STRP: ORAL_STRIP | 2 refills | Status: DC

## 2019-11-17 MED ORDER — ATORVASTATIN CALCIUM 40 MG PO TABS: 40.0000 mg | ORAL_TABLET | Freq: Every evening | ORAL | 2 refills | Status: DC

## 2019-11-17 MED ORDER — CYCLOBENZAPRINE HCL 10 MG PO TABS: 10.0000 mg | ORAL_TABLET | Freq: Two times a day (BID) | ORAL | 1 refills | Status: DC | PRN

## 2019-11-17 MED ORDER — GABAPENTIN 300 MG PO CAPS: 600.0000 mg | ORAL_CAPSULE | Freq: Three times a day (TID) | ORAL | 6 refills | Status: DC

## 2019-11-17 MED ORDER — ALLOPURINOL 300 MG PO TABS: 300.0000 mg | ORAL_TABLET | Freq: Every day | ORAL | 0 refills | Status: DC

## 2019-11-17 MED ORDER — COLCHICINE 0.6 MG PO CAPS: 0.6000 mg | ORAL_CAPSULE | Freq: Every day | ORAL | 3 refills | Status: DC | PRN

## 2019-11-17 MED ORDER — LISINOPRIL 40 MG PO TABS: 40.0000 mg | ORAL_TABLET | Freq: Every day | ORAL | 1 refills | Status: DC

## 2019-11-17 NOTE — Progress Notes (Signed)
Assessment & Plan:  Preston [name] was seen today for follow-up.  Diagnoses and all orders for this visit:  Type 2 diabetes mellitus treated without insulin (HCC) -     Glucose (CBG) -     Hemoglobin A1c -     metFORMIN (GLUCOPHAGE) 500 MG tablet; Take 1 tablet (500 mg total) by mouth 2 (two) times daily with a meal. -     gabapentin (NEURONTIN) 300 MG capsule; Take 2 capsules (600 mg total) by mouth 3 (three) times daily. -     glucose blood (ACCU-CHEK AVIVA PLUS) test strip; Use as instructed for 3 times daily testing of blood sugar. E11.9 Continue blood sugar control as discussed in office today, low carbohydrate diet, and regular physical exercise as tolerated, 150 minutes per week (30 min each day, 5 days per week, or 50 min 3 days per week). Keep blood sugar logs with fasting goal of 90-130 mg/dl, post prandial (after you eat) less than 180.  For Hypoglycemia: BS <60 and Hyperglycemia BS >400; contact the clinic ASAP. Annual eye exams and foot exams are recommended.   Essential hypertension -     metoprolol tartrate (LOPRESSOR) 50 MG tablet; Take 1 tablet (50 mg total) by mouth 2 (two) times daily. -     lisinopril (ZESTRIL) 40 MG tablet; Take 1 tablet (40 mg total) by mouth daily. -     amLODipine (NORVASC) 5 MG tablet; Take 1 tablet (5 mg total) by mouth at bedtime. -     CMP14+EGFR Continue all antihypertensives as prescribed.  Remember to bring in your blood pressure log with you for your follow up appointment.  DASH/Mediterranean Diets are healthier choices for HTN.    Mixed hyperlipidemia -     omega-3 acid ethyl esters (LOVAZA) 1 g capsule; Take 1 capsule (1 g total) by mouth 2 (two) times daily. -     atorvastatin (LIPITOR) 40 MG tablet; Take 1 tablet (40 mg total) by mouth every evening. INSTRUCTIONS: Work on a low fat, heart healthy diet and participate in regular aerobic exercise program by working out at least 150 minutes per week; 5 days a week-30 minutes per day. Avoid  red meat/beef/steak,  fried foods. junk foods, sodas, sugary drinks, unhealthy snacking, alcohol and smoking.  Drink at least 80 oz of water per day and monitor your carbohydrate intake daily.    Chronic pain syndrome -     cyclobenzaprine (FLEXERIL) 10 MG tablet; Take 1 tablet (10 mg total) by mouth 2 (two) times daily as needed for muscle spasms. -     DULoxetine (CYMBALTA) 60 MG capsule; Take 1 capsule (60 mg total) by mouth daily.  Chronic gout without tophus, unspecified cause, unspecified site -     allopurinol (ZYLOPRIM) 300 MG tablet; Take 1 tablet (300 mg total) by mouth at bedtime. -     Colchicine (MITIGARE) 0.6 MG CAPS; Take 0.6 mg by mouth daily as needed. M10.9  GAD (generalized anxiety disorder) -     hydrOXYzine (ATARAX/VISTARIL) 50 MG tablet; Take 1 tablet (50 mg total) by mouth 3 (three) times daily as needed. for anxiety    Patient has been counseled on age-appropriate routine health concerns for screening and prevention. These are reviewed and up-to-date. Referrals have been placed accordingly. Immunizations are up-to-date or declined.    Subjective:   Chief Complaint  Patient presents with  . Follow-up    Pt. is here to follow up on diabetes.    HPI Preston [name]  50 y.o. male presents to office today for follow up to diabetes.    DM TYPE 2  Currently taking metformin 500 mg BID. Taking gabapentin 600 mg TID for peripheral neuropathy. Reports readings at home 130-140s. Doing well. Weight is up. Needs to work on dietary intake and exercise.  Lab Results  Component Value Date   HGBA1C 7.0 (A) 08/18/2019    Essential Hypertension Slightly elevated today. Denies chest pain, shortness of breath, palpitations, lightheadedness, dizziness, headaches. Taking amlodipine 5 mg, lisinopril 40 mg, lopressor 50 mg BID. He does endorse BLE edema occurring at the end of the day as well as bilateral hand swelling. I have encouraged him to wear compression socks during the  day.  BP Readings from Last 3 Encounters:  11/17/19 (!) 135/92  09/30/19 126/70  08/18/19 127/82   Dyslipidemia LDL at goal of <70. Taking atorvastatin 40 mg and lovaza 1 gm BID daily as prescribed.  Lab Results  Component Value Date   LDLCALC 51 08/18/2019   Review of Systems  Constitutional: Negative for fever, malaise/fatigue and weight loss.  HENT: Positive for hearing loss and tinnitus. Negative for nosebleeds.   Eyes: Negative.  Negative for blurred vision, double vision and photophobia.  Respiratory: Negative.  Negative for cough and shortness of breath.   Cardiovascular: Positive for leg swelling. Negative for chest pain and palpitations.  Gastrointestinal: Negative.  Negative for heartburn, nausea and vomiting.  Musculoskeletal: Positive for joint pain. Negative for myalgias.  Neurological: Negative.  Negative for dizziness, focal weakness, seizures and headaches.  Psychiatric/Behavioral: Negative.  Negative for suicidal ideas.    Past Medical History:  Diagnosis Date  . Anxiety   . Asthma    daily inhaler  . Cancer ([place])    pt. reports diagnosed with skin cancer on face/uncertain type but he reports may be basal cell, pending surgery to remove 09/03/18  . CHF (congestive heart failure) ([place])    no cardiologist  . Chronic cough    states due to COPD  . Chronic lower back pain   . COPD (chronic obstructive pulmonary disease) (HCC)    no home O2  . Dyspnea    with humidity and high pollen count  . Edentulous    upper gum  . GERD (gastroesophageal reflux disease)   . Gout   . History of Clostridium difficile infection 2014  . Hypercholesterolemia   . Hypertension    states under control with med., has been on med. x 4 yr.  . Insulin dependent diabetes mellitus   . Osteoarthritis    bilat. hip  . Panic attacks   . Shoulder pain, right 04/18/2017    Past Surgical History:  Procedure Laterality Date  . COLON SURGERY     14 inches of colon removed  .  COLONOSCOPY WITH PROPOFOL N/A 04/01/2013   Procedure: COLONOSCOPY WITH PROPOFOL;  Surgeon: Arta Silence, MD;  Location: [facility];  Service: Endoscopy;  Laterality: N/A;  . FLEXIBLE SIGMOIDOSCOPY N/A 02/28/2013   Procedure: FLEXIBLE SIGMOIDOSCOPY;  Surgeon: Winfield Cunas., MD;  [facility] ENDOSCOPY;  Service: Endoscopy;  Laterality: N/A;  . LAPAROSCOPIC APPENDECTOMY  07/05/2009  . LAPAROSCOPIC LOW ANTERIOR RESECTION N/A 07/08/2013   Procedure: LAPAROSCOPIC LOW ANTERIOR RESECTION WITH TAKEDOWN OF SPLENIC FLEXURE.;  Surgeon: Adin Hector, MD;  Location: [place];  Service: General;  Laterality: N/A;  . MANDIBLE SURGERY Left 2010   "dry skin pocket cut out"  . MULTIPLE EXTRACTIONS WITH ALVEOLOPLASTY N/A 04/24/2017   Procedure: SURGICAL  REMOVAL OF TEETH NUMBERS `7 20-29 AND 31;  Surgeon: Michael Litter, DMD;  Location: [place];  Service: Oral Surgery;  Laterality: N/A;  . TOTAL HIP ARTHROPLASTY Left 06/25/2016   Procedure: TOTAL HIP ARTHROPLASTY ANTERIOR APPROACH;  Surgeon: Frederik Pear, MD;  Location: [place];  Service: Orthopedics;  Laterality: Left;  . TOTAL HIP ARTHROPLASTY Right 09/10/2016   Procedure: TOTAL HIP ARTHROPLASTY ANTERIOR APPROACH;  Surgeon: Frederik Pear, MD;  Location: [place];  Service: Orthopedics;  Laterality: Right;    Family History  Problem Relation Age of Onset  . Cancer Father        lymphoma  . Heart attack Mother     Social History Reviewed with no changes to be made today.   Outpatient Medications Prior to Visit  Medication Sig Dispense Refill  . albuterol (PROVENTIL) (2.5 MG/3ML) 0.083% nebulizer solution Take 3 mLs (2.5 mg total) by nebulization every 6 (six) hours as needed for wheezing or shortness of breath. 150 mL 1  . albuterol (VENTOLIN HFA) 108 (90 Base) MCG/ACT inhaler Inhale 2 puffs into the lungs every 6 (six) hours as needed for wheezing or shortness of breath (cough). 18 g 0  . Blood Glucose Monitoring Suppl (ACCU-CHEK AVIVA PLUS)  w/Device KIT 1 each by Does not apply route 3 (three) times daily. 1 kit 0  . calcium carbonate (TUMS EX) 750 MG chewable tablet Chew 1 tablet by mouth 3 (three) times daily as needed for heartburn.    [name] ibuprofen (ADVIL) 600 MG tablet Take 1 tablet (600 mg total) by mouth every 8 (eight) hours as needed for mild pain or moderate pain. 30 tablet 1  . allopurinol (ZYLOPRIM) 300 MG tablet Take 1 tablet (300 mg total) by mouth at bedtime. 90 tablet 0  . atorvastatin (LIPITOR) 40 MG tablet TAKE 1 TABLET BY MOUTH ONCE DAILY IN THE EVENING 90 tablet 0  . Colchicine (MITIGARE) 0.6 MG CAPS Take 0.6 mg by mouth daily as needed. M10.9 30 capsule 3  . gabapentin (NEURONTIN) 300 MG capsule TAKE 2 CAPSULES BY MOUTH THREE TIMES DAILY 180 capsule 0  . glucose blood (ACCU-CHEK AVIVA PLUS) test strip Use as instructed for 3 times daily testing of blood sugar. E11.9 300 each 2  . hydrOXYzine (ATARAX/VISTARIL) 50 MG tablet Take 1 tablet (50 mg total) by mouth 3 (three) times daily as needed. for anxiety 90 tablet 1  . metoprolol tartrate (LOPRESSOR) 50 MG tablet Take 1 tablet by mouth twice daily 180 tablet 0  . amLODipine (NORVASC) 5 MG tablet Take 1 tablet (5 mg total) by mouth at bedtime. 90 tablet 1  . cyclobenzaprine (FLEXERIL) 10 MG tablet Take 1 tablet (10 mg total) by mouth 2 (two) times daily as needed for muscle spasms. (Patient not taking: Reported on 11/17/2019) 60 tablet 1  . DULoxetine (CYMBALTA) 60 MG capsule Take 1 capsule (60 mg total) by mouth daily. 90 capsule 1  . lisinopril (ZESTRIL) 40 MG tablet Take 1 tablet (40 mg total) by mouth daily. 90 tablet 1  . metFORMIN (GLUCOPHAGE) 500 MG tablet Take 1 tablet (500 mg total) by mouth 2 (two) times daily with a meal. 180 tablet 1  . omega-3 acid ethyl esters (LOVAZA) 1 g capsule Take 1 capsule (1 g total) by mouth 2 (two) times daily. 180 capsule 1   No facility-administered medications prior to visit.    Allergies  Allergen Reactions  . Morphine And  Related Itching and Nausea And Vomiting  . Adhesive [  Tape] Itching and Rash       Objective:    BP (!) 135/92 (BP Location: Right Arm, Patient Position: Sitting, Cuff Size: Large)   Pulse 81   Temp 97.7 F (36.5 C) (Temporal)   Ht 6' 2"  (1.88 m)   Wt (!) 301 lb (136.5 kg)   SpO2 93%   BMI 38.65 kg/m  Wt Readings from Last 3 Encounters:  11/17/19 (!) 301 lb (136.5 kg)  09/30/19 298 lb 6.4 oz (135.4 kg)  08/18/19 295 lb (133.8 kg)    Physical Exam Vitals and nursing note reviewed.  Constitutional:      Appearance: He is well-developed.  HENT:     Head: Normocephalic and atraumatic.     Right Ear: There is impacted cerumen.     Left Ear: There is impacted cerumen.  Cardiovascular:     Rate and Rhythm: Normal rate and regular rhythm.     Heart sounds: Normal heart sounds. No murmur. No friction rub. No gallop.   Pulmonary:     Effort: Pulmonary effort is normal. No tachypnea or respiratory distress.     Breath sounds: Normal breath sounds. No decreased breath sounds, wheezing, rhonchi or rales.  Chest:     Chest wall: No tenderness.  Abdominal:     General: Bowel sounds are normal.     Palpations: Abdomen is soft.  Musculoskeletal:        General: No swelling. Normal range of motion.     Cervical back: Normal range of motion.     Right lower leg: No edema.     Left lower leg: No edema.  Skin:    General: Skin is warm and dry.  Neurological:     Mental Status: He is alert and oriented to person, place, and time.     Coordination: Coordination normal.  Psychiatric:        Behavior: Behavior normal. Behavior is cooperative.        Thought Content: Thought content normal.        Judgment: Judgment normal.          Patient has been counseled extensively about nutrition and exercise as well as the importance of adherence with medications and regular follow-up. The patient was given clear instructions to go to ER or return to medical center if symptoms don't improve,  worsen or new problems develop. The patient verbalized understanding.   Follow-up: Return for NURSE VISIT EAR WAX REMOVAL then see me in 3 months.   Gildardo Pounds, FNP-BC [facility] [place] [facility] [place], [place]   11/17/2019, 8:42 PM

## 2019-11-18 ENCOUNTER — Other Ambulatory Visit: Payer: Self-pay | Admitting: Nurse Practitioner

## 2019-11-18 DIAGNOSIS — R7401 Elevation of levels of liver transaminase levels: Secondary | ICD-10-CM

## 2019-11-18 LAB — HEMOGLOBIN A1C
Est. average glucose Bld gHb Est-mCnc: 148 mg/dL
Hgb A1c MFr Bld: 6.8 % — ABNORMAL HIGH (ref 4.8–5.6)

## 2019-11-18 LAB — CMP14+EGFR
ALT: 93 IU/L — ABNORMAL HIGH (ref 0–44)
AST: 50 IU/L — ABNORMAL HIGH (ref 0–40)
Albumin/Globulin Ratio: 2.1 (ref 1.2–2.2)
Albumin: 5 g/dL (ref 4.0–5.0)
Alkaline Phosphatase: 76 IU/L (ref 39–117)
BUN/Creatinine Ratio: 9 (ref 9–20)
BUN: 7 mg/dL (ref 6–24)
Bilirubin Total: 0.2 mg/dL (ref 0.0–1.2)
CO2: 21 mmol/L (ref 20–29)
Calcium: 10.5 mg/dL — ABNORMAL HIGH (ref 8.7–10.2)
Chloride: 98 mmol/L (ref 96–106)
Creatinine, Ser: 0.78 mg/dL (ref 0.76–1.27)
GFR calc Af Amer: 123 mL/min/{1.73_m2} (ref >59)
GFR calc non Af Amer: 107 mL/min/{1.73_m2} (ref >59)
Globulin, Total: 2.4 g/dL (ref 1.5–4.5)
Glucose: 170 mg/dL — ABNORMAL HIGH (ref 65–99)
Potassium: 5.3 mmol/L — ABNORMAL HIGH (ref 3.5–5.2)
Sodium: 138 mmol/L (ref 134–144)
Total Protein: 7.4 g/dL (ref 6.0–8.5)

## 2019-11-24 ENCOUNTER — Ambulatory Visit: Payer: Medicaid Other | Attending: Nurse Practitioner | Admitting: *Deleted

## 2019-11-24 ENCOUNTER — Other Ambulatory Visit: Payer: Self-pay

## 2019-11-24 DIAGNOSIS — H6123 Impacted cerumen, bilateral: Secondary | ICD-10-CM

## 2019-11-24 NOTE — Progress Notes (Signed)
Small amount of cerumin is noted.  Wax is removed by elephant ear device. Patient tolerated procedure well. Denies any dizziness throughout or after the procedure. Patient informed of the name of the solution he was advised to pick up by his PCP (Debrox) .   Informed of lab results and education provided. Scheduled an appointment for lab visit to recheck LFT. Patient given reminder card.

## 2019-11-27 ENCOUNTER — Encounter: Payer: Self-pay | Admitting: *Deleted

## 2019-12-07 ENCOUNTER — Other Ambulatory Visit: Payer: Medicaid Other

## 2019-12-08 ENCOUNTER — Other Ambulatory Visit: Payer: Self-pay

## 2019-12-08 ENCOUNTER — Encounter: Payer: Self-pay | Admitting: Cardiology

## 2019-12-08 ENCOUNTER — Ambulatory Visit: Payer: Medicaid Other | Attending: Nurse Practitioner

## 2019-12-08 ENCOUNTER — Ambulatory Visit: Payer: Medicaid Other | Admitting: Cardiology

## 2019-12-08 VITALS — BP 130/80 | HR 80 | Ht 74.0 in | Wt 303.0 lb

## 2019-12-08 DIAGNOSIS — R7401 Elevation of levels of liver transaminase levels: Secondary | ICD-10-CM

## 2019-12-08 DIAGNOSIS — R55 Syncope and collapse: Secondary | ICD-10-CM | POA: Diagnosis not present

## 2019-12-08 DIAGNOSIS — E119 Type 2 diabetes mellitus without complications: Secondary | ICD-10-CM | POA: Diagnosis not present

## 2019-12-08 NOTE — Patient Instructions (Signed)
Medication Instructions:  The current medical regimen is effective;  continue present plan and medications.  *If you need a refill on your cardiac medications before your next appointment, please call your pharmacy*  You have been referred to Electrophysiology for further evaluation of syncope.  Follow-Up: At [facility], you and your health needs are our priority.  As part of our continuing mission to provide you with exceptional heart care, we have created designated Provider Care Teams.  These Care Teams include your primary Cardiologist (physician) and Advanced Practice Providers (APPs -  Physician Assistants and Nurse Practitioners) who all work together to provide you with the care you need, when you need it.  We recommend signing up for the patient portal called "MyChart".  Sign up information is provided on this After Visit Summary.  MyChart is used to connect with patients for Virtual Visits (Telemedicine).  Patients are able to view lab/test results, encounter notes, upcoming appointments, etc.  Non-urgent messages can be sent to your provider as well.   To learn more about what you can do with MyChart, go to [email or web address].    Your next appointment:   6 month(s)  The format for your next appointment:   In Person  Provider:   Candee Furbish, MD   Thank you for choosing [facility]!!

## 2019-12-08 NOTE — Progress Notes (Signed)
Cardiology Office Note:    Date:  12/08/2019   ID:  [name], DOB [birth year], MRN [id number]  PCP:  Gildardo Pounds, NP  Cardiologist:  Candee Furbish, MD  Electrophysiologist:  None   Referring MD: Gildardo Pounds, NP     History of Present Illness:    Preston [name] is a 49 y.o. male here for the follow-up of monitor results, paroxysmal atrial tachycardia Sinus rhythm with occasional first-degree AV block, average heart rate 86 bpm.  There are rare instances of second-degree heart block type I, Wenkebach. One instance occurred in the morning hours, 9:06 AM  1 brief episode of paroxysmal atrial tachycardia, heart rate of 120 bpm.  With his prior episodes of syncope, and current monitor demonstrating occasional second-degree heart block type I, I would like for him to be set up for electrophysiology evaluation/consultation.  Has COPD hyperlipidemia anxiety grade 1 diastolic dysfunction.  Had a syncopal episode in 2021 that suddenly occurred without warning.  It was witnessed, several seconds duration.  After he regained consciousness he felt normal.  He did not have any specific prodrome.  One incident may have occurred after coughing at the table.  Another episode may or may not have occurred with coughing.   Past Medical History:  Diagnosis Date   Anxiety    Asthma    daily inhaler   Cancer ([place])    pt. reports diagnosed with skin cancer on face/uncertain type but he reports may be basal cell, pending surgery to remove 09/03/18   CHF (congestive heart failure) ([place])    no cardiologist   Chronic cough    states due to COPD   Chronic lower back pain    COPD (chronic obstructive pulmonary disease) ([place])    no home O2   Dyspnea    with humidity and high pollen count   Edentulous    upper gum   GERD (gastroesophageal reflux disease)    Gout    History of Clostridium difficile infection 2014   Hypercholesterolemia    Hypertension    states under  control with med., has been on med. x 4 yr.   Insulin dependent diabetes mellitus    Osteoarthritis    bilat. hip   Panic attacks    Shoulder pain, right 04/18/2017    Past Surgical History:  Procedure Laterality Date   COLON SURGERY     14 inches of colon removed   COLONOSCOPY WITH PROPOFOL N/A 04/01/2013   Procedure: COLONOSCOPY WITH PROPOFOL;  Surgeon: Arta Silence, MD;  Location: [facility];  Service: Endoscopy;  Laterality: N/A;   FLEXIBLE SIGMOIDOSCOPY N/A 02/28/2013   Procedure: FLEXIBLE SIGMOIDOSCOPY;  Surgeon: Winfield Cunas., MD;  [facility] ENDOSCOPY;  Service: Endoscopy;  Laterality: N/A;   LAPAROSCOPIC APPENDECTOMY  07/05/2009   LAPAROSCOPIC LOW ANTERIOR RESECTION N/A 07/08/2013   Procedure: LAPAROSCOPIC LOW ANTERIOR RESECTION WITH TAKEDOWN OF SPLENIC FLEXURE.;  Surgeon: Adin Hector, MD;  Location: [place];  Service: General;  Laterality: N/A;   MANDIBLE SURGERY Left 2010   "dry skin pocket cut out"   MULTIPLE EXTRACTIONS WITH ALVEOLOPLASTY N/A 04/24/2017   Procedure: SURGICAL REMOVAL OF TEETH NUMBERS `7 20-29 AND 31;  Surgeon: Michael Litter, DMD;  Location: [place];  Service: Oral Surgery;  Laterality: N/A;   TOTAL HIP ARTHROPLASTY Left 06/25/2016   Procedure: TOTAL HIP ARTHROPLASTY ANTERIOR APPROACH;  Surgeon: Frederik Pear, MD;  Location: [place];  Service: Orthopedics;  Laterality: Left;   TOTAL  HIP ARTHROPLASTY Right 09/10/2016   Procedure: TOTAL HIP ARTHROPLASTY ANTERIOR APPROACH;  Surgeon: Frederik Pear, MD;  Location: [place];  Service: Orthopedics;  Laterality: Right;    Current Medications: Current Meds  Medication Sig   albuterol (PROVENTIL) (2.5 MG/3ML) 0.083% nebulizer solution Take 3 mLs (2.5 mg total) by nebulization every 6 (six) hours as needed for wheezing or shortness of breath.   albuterol (VENTOLIN HFA) 108 (90 Base) MCG/ACT inhaler Inhale 2 puffs into the lungs every 6 (six) hours as needed for wheezing or shortness of  breath (cough).   allopurinol (ZYLOPRIM) 300 MG tablet Take 1 tablet (300 mg total) by mouth at bedtime.   amLODipine (NORVASC) 5 MG tablet Take 1 tablet (5 mg total) by mouth at bedtime.   atorvastatin (LIPITOR) 40 MG tablet Take 1 tablet (40 mg total) by mouth every evening.   Blood Glucose Monitoring Suppl (ACCU-CHEK AVIVA PLUS) w/Device KIT 1 each by Does not apply route 3 (three) times daily.   calcium carbonate (TUMS EX) 750 MG chewable tablet Chew 1 tablet by mouth 3 (three) times daily as needed for heartburn.   Colchicine (MITIGARE) 0.6 MG CAPS Take 0.6 mg by mouth daily as needed. M10.9   cyclobenzaprine (FLEXERIL) 10 MG tablet Take 1 tablet (10 mg total) by mouth 2 (two) times daily as needed for muscle spasms.   DULoxetine (CYMBALTA) 60 MG capsule Take 1 capsule (60 mg total) by mouth daily.   gabapentin (NEURONTIN) 300 MG capsule Take 2 capsules (600 mg total) by mouth 3 (three) times daily.   glucose blood (ACCU-CHEK AVIVA PLUS) test strip Use as instructed for 3 times daily testing of blood sugar. E11.9   hydrOXYzine (ATARAX/VISTARIL) 50 MG tablet Take 1 tablet (50 mg total) by mouth 3 (three) times daily as needed. for anxiety   ibuprofen (ADVIL) 600 MG tablet Take 1 tablet (600 mg total) by mouth every 8 (eight) hours as needed for mild pain or moderate pain.   lisinopril (ZESTRIL) 40 MG tablet Take 1 tablet (40 mg total) by mouth daily.   metFORMIN (GLUCOPHAGE) 500 MG tablet Take 1 tablet (500 mg total) by mouth 2 (two) times daily with a meal.   metoprolol tartrate (LOPRESSOR) 50 MG tablet Take 1 tablet (50 mg total) by mouth 2 (two) times daily.   omega-3 acid ethyl esters (LOVAZA) 1 g capsule Take 1 capsule (1 g total) by mouth 2 (two) times daily.     Allergies:   Morphine and related and Adhesive [tape]   Social History   Socioeconomic History   Marital status: Single    Spouse name: Not on file   Number of children: Not on file   Years of  education: Not on file   Highest education level: Not on file  Occupational History   Not on file  Tobacco Use   Smoking status: Former Smoker    Packs/day: 0.00    Quit date: 06/11/2016    Years since quitting: 3.4   Smokeless tobacco: Former Systems developer    Types: Chew  Substance and Sexual Activity   Alcohol use: Yes    Comment: 3-6 drinks/day   Drug use: No   Sexual activity: Yes  Other Topics Concern   Not on file  Social History Narrative   Not on file   Social Determinants of Health   Financial Resource Strain:    Difficulty of Paying Living Expenses:   Food Insecurity:    Worried About Charity fundraiser in  the Last Year:    Arboriculturist in the Last Year:   Transportation Needs:    Film/video editor (Medical):    Lack of Transportation (Non-Medical):   Physical Activity:    Days of Exercise per Week:    Minutes of Exercise per Session:   Stress:    Feeling of Stress :   Social Connections:    Frequency of Communication with Friends and Family:    Frequency of Social Gatherings with Friends and Family:    Attends Religious Services:    Active Member of Clubs or Organizations:    Attends Archivist Meetings:    Marital Status:      Family History: The patient's family history includes Cancer in his father; Heart attack in his mother.  ROS:   Please see the history of present illness.    No recurrent syncope, no bleeding no orthopnea all other systems reviewed and are negative.  EKGs/Labs/Other Studies Reviewed:    The following studies were reviewed today: Event monitor as above Echo normal EF  Recent Labs: 02/17/2019: Hemoglobin 14.7; Platelets 321 11/17/2019: ALT 93; BUN 7; Creatinine, Ser 0.78; Potassium 5.3; Sodium 138  Recent Lipid Panel    Component Value Date/Time   CHOL 164 08/18/2019 1217   TRIG 508 (H) 08/18/2019 1217   HDL 38 (L) 08/18/2019 1217   CHOLHDL 4.3 08/18/2019 1217   CHOLHDL 5.6 12/13/2016  0605   VLDL UNABLE TO CALCULATE IF TRIGLYCERIDE OVER 400 mg/dL 12/13/2016 0605   LDLCALC 51 08/18/2019 1217    Physical Exam:    VS:  BP 130/80    Pulse 80    Ht 6' 2"  (1.88 m)    Wt (!) 303 lb (137.4 kg)    SpO2 96%    BMI 38.90 kg/m     Wt Readings from Last 3 Encounters:  12/08/19 (!) 303 lb (137.4 kg)  11/17/19 (!) 301 lb (136.5 kg)  09/30/19 298 lb 6.4 oz (135.4 kg)     GEN:  Well nourished, well developed in no acute distress HEENT: Normal NECK: No JVD; No carotid bruits LYMPHATICS: No lymphadenopathy CARDIAC: RRR, no murmurs, rubs, gallops RESPIRATORY:  Clear to auscultation without rales, wheezing or rhonchi  ABDOMEN: Soft, non-tender, non-distended MUSCULOSKELETAL:  No edema; No deformity  SKIN: Warm and dry NEUROLOGIC:  Alert and oriented x 3 PSYCHIATRIC:  Normal affect   ASSESSMENT:    1. Syncope, unspecified syncope type   2. Morbid obesity ([place])   3. Type 2 diabetes mellitus treated without insulin (HCC)    PLAN:    In order of problems listed above:  Syncope -Given his monitor results as above, I would like for him to visit EP for consultation.  It is possible that his syncope was cough related however given the second-degree heart block type I, device may need to be entertained. -At last visit we stopped his Lasix.  Continue to hydrate.  Neurologic work-up per primary team. Still sees "dots" when sitting watching TV or maybe cough.   Shortness of breath grade 1 diastolic dysfunction/tobacco use -Multifactorial.  Continue with weight reduction.  Morbid obesity -Continue encourage weight loss.  Tobacco use -Continue cessation.  Diabetes with hypertension -Per primary team.  Monitoring closely.  Last hemoglobin A1c 6.8.  Creatinine 0.78 ALT was increased to 93.  Liver function is being repeated today.  Mixed hyperlipidemia -LDL is excellent at 51 however triglycerides are 508.  Currently has Lovaza on his list.  Could  try fenofibrate in the future as  well.   Medication Adjustments/Labs and Tests Ordered: Current medicines are reviewed at length with the patient today.  Concerns regarding medicines are outlined above.  Orders Placed This Encounter  Procedures   Ambulatory referral to Cardiac Electrophysiology   No orders of the defined types were placed in this encounter.   Patient Instructions  Medication Instructions:  The current medical regimen is effective;  continue present plan and medications.  *If you need a refill on your cardiac medications before your next appointment, please call your pharmacy*  You have been referred to Electrophysiology for further evaluation of syncope.  Follow-Up: At [facility], you and your health needs are our priority.  As part of our continuing mission to provide you with exceptional heart care, we have created designated Provider Care Teams.  These Care Teams include your primary Cardiologist (physician) and Advanced Practice Providers (APPs -  Physician Assistants and Nurse Practitioners) who all work together to provide you with the care you need, when you need it.  We recommend signing up for the patient portal called "MyChart".  Sign up information is provided on this After Visit Summary.  MyChart is used to connect with patients for Virtual Visits (Telemedicine).  Patients are able to view lab/test results, encounter notes, upcoming appointments, etc.  Non-urgent messages can be sent to your provider as well.   To learn more about what you can do with MyChart, go to [email or web address].    Your next appointment:   6 month(s)  The format for your next appointment:   In Person  Provider:   Candee Furbish, MD   Thank you for choosing [facility]!!         Signed, Candee Furbish, MD  12/08/2019 12:02 PM    [place]

## 2019-12-09 LAB — HEPATIC FUNCTION PANEL
ALT: 65 IU/L — ABNORMAL HIGH (ref 0–44)
AST: 36 IU/L (ref 0–40)
Albumin: 5 g/dL (ref 4.0–5.0)
Alkaline Phosphatase: 81 IU/L (ref 39–117)
Bilirubin Total: 0.2 mg/dL (ref 0.0–1.2)
Bilirubin, Direct: 0.09 mg/dL (ref 0.00–0.40)
Total Protein: 6.9 g/dL (ref 6.0–8.5)

## 2019-12-11 ENCOUNTER — Other Ambulatory Visit: Payer: Self-pay | Admitting: Nurse Practitioner

## 2019-12-11 DIAGNOSIS — J42 Unspecified chronic bronchitis: Secondary | ICD-10-CM

## 2019-12-21 ENCOUNTER — Telehealth (INDEPENDENT_AMBULATORY_CARE_PROVIDER_SITE_OTHER): Payer: Medicaid Other | Admitting: Internal Medicine

## 2019-12-21 ENCOUNTER — Other Ambulatory Visit: Payer: Self-pay

## 2019-12-21 ENCOUNTER — Encounter: Payer: Self-pay | Admitting: Internal Medicine

## 2019-12-21 ENCOUNTER — Telehealth: Payer: Self-pay

## 2019-12-21 DIAGNOSIS — R55 Syncope and collapse: Secondary | ICD-10-CM | POA: Diagnosis not present

## 2019-12-21 DIAGNOSIS — I10 Essential (primary) hypertension: Secondary | ICD-10-CM | POA: Diagnosis not present

## 2019-12-21 DIAGNOSIS — R0602 Shortness of breath: Secondary | ICD-10-CM

## 2019-12-21 NOTE — Telephone Encounter (Signed)
-----   Message from Thompson Grayer, MD sent at 12/21/2019  3:36 PM EDT ----- Schedule exercise treadmill test with me on a day I am there

## 2019-12-21 NOTE — Telephone Encounter (Signed)
Order placed for treadmill stress test.  Message sent to scheduling regarding days Dr. Rayann Heman is available to be present for test.

## 2019-12-21 NOTE — Progress Notes (Signed)
Electrophysiology TeleHealth Note   Due to national recommendations of social distancing due to [name], Audio telehealth visit is felt to be most appropriate for this patient at this time.  See MyChart message from today for patient consent regarding telehealth for [facility].   Date:  12/21/2019   ID:  Preston [name], DOB 1970-11-20, MRN [id number]  Location: home  Provider location: [place] Evaluation Performed: New patient consult  PCP:  Gildardo Pounds, NP  Cardiologist:  Candee Furbish, MD Electrophysiologist:  None   Chief Complaint:  syncope  History of Present Illness:    Preston [name] is a 49 y.o. male who presents via Engineer, civil (consulting) for a telehealth visit today.   The patient is referred for new consultation regarding cough syncope by Dr Marlou Porch.   The patient has had two episodes where he had cough syncope.  He reports that he has a forceful cough with subsequent transient loss of consciousness on two separate occasions.  He has not had syncope or any other presyncope at other times. He has occasional SOB with moderate activity.  Today, he denies symptoms of palpitations, chest pain, shortness of breath, orthopnea, PND, lower extremity edema,  bleeding, or neurologic sequela. The patient is tolerating medications without difficulties and is otherwise without complaint today.     Past Medical History:  Diagnosis Date  . Anxiety   . Asthma    daily inhaler  . Cancer ([place]on [name])    pt. reports diagnosed with skin cancer on face/uncertain type but he reports may be basal cell, pending surgery to remove 09/03/18  . CHF (congestive heart failure) ([place])    no cardiologist  . Chronic cough    states due to COPD  . Chronic lower back pain   . COPD (chronic obstructive pulmonary disease) (HCC)    no home O2  . Dyspnea    with humidity and high pollen count  . Edentulous    upper gum  . GERD (gastroesophageal reflux disease)   . Gout   . History of  Clostridium difficile infection 2014  . Hypercholesterolemia   . Hypertension    states under control with med., has been on med. x 4 yr.  . Insulin dependent diabetes mellitus   . Osteoarthritis    bilat. hip  . Panic attacks   . Shoulder pain, right 04/18/2017    Past Surgical History:  Procedure Laterality Date  . COLON SURGERY     14 inches of colon removed  . COLONOSCOPY WITH PROPOFOL N/A 04/01/2013   Procedure: COLONOSCOPY WITH PROPOFOL;  Surgeon: Arta Silence, MD;  [facility]: [facility];  Service: Endoscopy;  Laterality: N/A;  . FLEXIBLE SIGMOIDOSCOPY N/A 02/28/2013   Procedure: FLEXIBLE SIGMOIDOSCOPY;  Surgeon: Winfield Cunas., MD;  Location: [facility] ENDOSCOPY;  Service: Endoscopy;  Laterality: N/A;  . LAPAROSCOPIC APPENDECTOMY  07/05/2009  . LAPAROSCOPIC LOW ANTERIOR RESECTION N/A 07/08/2013   Procedure: LAPAROSCOPIC LOW ANTERIOR RESECTION WITH TAKEDOWN OF SPLENIC FLEXURE.;  Surgeon: Adin Hector, MD;  Location: [place];  Service: General;  Laterality: N/A;  . MANDIBLE SURGERY Left 2010   "dry skin pocket cut out"  . MULTIPLE EXTRACTIONS WITH ALVEOLOPLASTY N/A 04/24/2017   Procedure: SURGICAL REMOVAL OF TEETH NUMBERS `7 20-29 AND 31;  Surgeon: Michael Litter, DMD;  Location: [place];  Service: Oral Surgery;  Laterality: N/A;  . TOTAL HIP ARTHROPLASTY Left 06/25/2016   Procedure: TOTAL HIP ARTHROPLASTY ANTERIOR APPROACH;  Surgeon: Frederik Pear,  MD;  Location: [place];  Service: Orthopedics;  Laterality: Left;  . TOTAL HIP ARTHROPLASTY Right 09/10/2016   Procedure: TOTAL HIP ARTHROPLASTY ANTERIOR APPROACH;  Surgeon: Frederik Pear, MD;  Location: [place];  Service: Orthopedics;  Laterality: Right;    Current Outpatient Medications  Medication Sig Dispense Refill  . albuterol (PROVENTIL) (2.5 MG/3ML) 0.083% nebulizer solution Take 3 mLs (2.5 mg total) by nebulization every 6 (six) hours as needed for wheezing or shortness of breath. 150 mL 1  . albuterol (VENTOLIN HFA)  108 (90 Base) MCG/ACT inhaler INHALE 2 PUFFS BY MOUTH EVERY 6 HOURS AS NEEDED FOR WHEEZING OR SHORTNESS OF BREATH (COUGH) 18 g 2  . allopurinol (ZYLOPRIM) 300 MG tablet Take 1 tablet (300 mg total) by mouth at bedtime. 90 tablet 0  . amLODipine (NORVASC) 5 MG tablet Take 1 tablet (5 mg total) by mouth at bedtime. 90 tablet 1  . atorvastatin (LIPITOR) 40 MG tablet Take 1 tablet (40 mg total) by mouth every evening. 90 tablet 2  . Blood Glucose Monitoring Suppl (ACCU-CHEK AVIVA PLUS) w/Device KIT 1 each by Does not apply route 3 (three) times daily. 1 kit 0  . calcium carbonate (TUMS EX) 750 MG chewable tablet Chew 1 tablet by mouth 3 (three) times daily as needed for heartburn.    . Colchicine (MITIGARE) 0.6 MG CAPS Take 0.6 mg by mouth daily as needed. M10.9 30 capsule 3  . cyclobenzaprine (FLEXERIL) 10 MG tablet Take 1 tablet (10 mg total) by mouth 2 (two) times daily as needed for muscle spasms. 60 tablet 1  . DULoxetine (CYMBALTA) 60 MG capsule Take 1 capsule (60 mg total) by mouth daily. 90 capsule 1  . gabapentin (NEURONTIN) 300 MG capsule Take 2 capsules (600 mg total) by mouth 3 (three) times daily. 180 capsule 6  . glucose blood (ACCU-CHEK AVIVA PLUS) test strip Use as instructed for 3 times daily testing of blood sugar. E11.9 300 each 2  . hydrOXYzine (ATARAX/VISTARIL) 50 MG tablet Take 1 tablet (50 mg total) by mouth 3 (three) times daily as needed. for anxiety 90 tablet 1  . ibuprofen (ADVIL) 600 MG tablet Take 1 tablet (600 mg total) by mouth every 8 (eight) hours as needed for mild pain or moderate pain. 30 tablet 1  . lisinopril (ZESTRIL) 40 MG tablet Take 1 tablet (40 mg total) by mouth daily. 90 tablet 1  . metFORMIN (GLUCOPHAGE) 500 MG tablet Take 1 tablet (500 mg total) by mouth 2 (two) times daily with a meal. 180 tablet 1  . metoprolol tartrate (LOPRESSOR) 50 MG tablet Take 1 tablet (50 mg total) by mouth 2 (two) times daily. 180 tablet 1   No current facility-administered  medications for this visit.    Allergies:   Morphine and related and Adhesive [tape]   Social History:  The patient  reports that he quit smoking about 3 years ago. He smoked 0.00 packs per day. He has quit using smokeless tobacco.  His smokeless tobacco use included chew. He reports current alcohol use. He reports that he does not use drugs.   Family History:  The patient's family history includes Cancer in his father; Heart attack in his mother.    ROS:  Please see the history of present illness.   All other systems are personally reviewed and negative.    Exam:    Vital Signs:  There were no vitals taken for this visit.   Well sounding, alert and conversant   Labs/Other Tests  and Data Reviewed:    Recent Labs: 02/17/2019: Hemoglobin 14.7; Platelets 321 11/17/2019: BUN 7; Creatinine, Ser 0.78; Potassium 5.3; Sodium 138 12/08/2019: ALT 65   Wt Readings from Last 3 Encounters:  12/08/19 (!) 303 lb (137.4 kg)  11/17/19 (!) 301 lb (136.5 kg)  09/30/19 298 lb 6.4 oz (135.4 kg)     Other studies personally reviewed: Additional studies/ records that were reviewed today include: Dr Marlou Porch notes, prior echo, ekgs, recent event monitor  Review of the above records today demonstrates: as above   ASSESSMENT & PLAN:    1.  Cough syncope No syncope except in the setting of forceful cough.  No indication for additional evaluation at this time.  He is aware that if he is about to cough, he should sit down or pull over (if driving).  2. [place]itz I second degree AV block Noted on his recent event monitor.  Also associated with PP prolongation suggesting vagal event.  He thinks that he may have been asleep at the time.  3. Exertional SOB Order ETT to further evaluate/ may need modified bruce given chronic hip pain.  I would like to make sure that he does not have exertional AV block or AV block at higher atrial rates.  4. Morbid obesity Lifestyle modification is advised   5.  HTN Stable No change required today   Patient Risk:  after full review of this patients clinical status, I feel that they are at moderate risk at this time.   Today, I have spent 20 minutes with the patient with telehealth technology discussing cough syncope .    Signed, Thompson Grayer MD, [facility] 12/21/2019 3:40 PM   [place] [id number] [phone] (office) [phone] (fax)

## 2020-01-04 ENCOUNTER — Ambulatory Visit (INDEPENDENT_AMBULATORY_CARE_PROVIDER_SITE_OTHER): Payer: Medicaid Other

## 2020-01-04 ENCOUNTER — Other Ambulatory Visit: Payer: Self-pay

## 2020-01-04 ENCOUNTER — Ambulatory Visit (INDEPENDENT_AMBULATORY_CARE_PROVIDER_SITE_OTHER): Payer: Medicaid Other | Admitting: Internal Medicine

## 2020-01-04 ENCOUNTER — Other Ambulatory Visit: Payer: Self-pay | Admitting: Internal Medicine

## 2020-01-04 DIAGNOSIS — R0602 Shortness of breath: Secondary | ICD-10-CM

## 2020-01-04 DIAGNOSIS — R054 Cough syncope: Secondary | ICD-10-CM

## 2020-01-04 DIAGNOSIS — R05 Cough: Secondary | ICD-10-CM

## 2020-01-10 LAB — EXERCISE TOLERANCE TEST
Estimated workload: 2.3 METS
Exercise duration (min): 2 min
Exercise duration (sec): 31 s
MPHR: 172 {beats}/min
Peak HR: 98 {beats}/min
Percent HR: 56 %
RPE: 13
Rest HR: 76 {beats}/min

## 2020-01-16 ENCOUNTER — Other Ambulatory Visit: Payer: Self-pay | Admitting: Nurse Practitioner

## 2020-01-16 DIAGNOSIS — K219 Gastro-esophageal reflux disease without esophagitis: Secondary | ICD-10-CM

## 2020-01-31 NOTE — Progress Notes (Signed)
See treadmill report.

## 2020-02-16 ENCOUNTER — Ambulatory Visit: Payer: Medicaid Other | Admitting: Nurse Practitioner

## 2020-02-22 ENCOUNTER — Emergency Department (HOSPITAL_COMMUNITY)
Admission: EM | Admit: 2020-02-22 | Discharge: 2020-02-23 | Disposition: A | Discharge status: Home / Self Care | Payer: Medicaid Other | Attending: Emergency Medicine | Admitting: Emergency Medicine

## 2020-02-22 ENCOUNTER — Other Ambulatory Visit: Payer: Self-pay

## 2020-02-22 ENCOUNTER — Encounter (HOSPITAL_COMMUNITY): Payer: Self-pay | Admitting: *Deleted

## 2020-02-22 DIAGNOSIS — R110 Nausea: Secondary | ICD-10-CM | POA: Insufficient documentation

## 2020-02-22 DIAGNOSIS — N2889 Other specified disorders of kidney and ureter: Secondary | ICD-10-CM | POA: Diagnosis not present

## 2020-02-22 DIAGNOSIS — R1011 Right upper quadrant pain: Secondary | ICD-10-CM | POA: Diagnosis not present

## 2020-02-22 DIAGNOSIS — R809 Proteinuria, unspecified: Secondary | ICD-10-CM | POA: Diagnosis not present

## 2020-02-22 DIAGNOSIS — M549 Dorsalgia, unspecified: Secondary | ICD-10-CM | POA: Insufficient documentation

## 2020-02-22 DIAGNOSIS — Z5321 Procedure and treatment not carried out due to patient leaving prior to being seen by health care provider: Secondary | ICD-10-CM | POA: Insufficient documentation

## 2020-02-22 DIAGNOSIS — I700 Atherosclerosis of aorta: Secondary | ICD-10-CM | POA: Diagnosis not present

## 2020-02-22 DIAGNOSIS — K760 Fatty (change of) liver, not elsewhere classified: Secondary | ICD-10-CM | POA: Diagnosis not present

## 2020-02-22 DIAGNOSIS — K5790 Diverticulosis of intestine, part unspecified, without perforation or abscess without bleeding: Secondary | ICD-10-CM | POA: Diagnosis not present

## 2020-02-22 LAB — COMPREHENSIVE METABOLIC PANEL
ALT: 66 U/L — ABNORMAL HIGH (ref 0–44)
AST: 31 U/L (ref 15–41)
Albumin: 4.5 g/dL (ref 3.5–5.0)
Alkaline Phosphatase: 57 U/L (ref 38–126)
Anion gap: 11 (ref 5–15)
BUN: <5 mg/dL — ABNORMAL LOW (ref 6–20)
CO2: 27 mmol/L (ref 22–32)
Calcium: 9.8 mg/dL (ref 8.9–10.3)
Chloride: 95 mmol/L — ABNORMAL LOW (ref 98–111)
Creatinine, Ser: 0.83 mg/dL (ref 0.61–1.24)
GFR calc Af Amer: >60 mL/min (ref >60)
GFR calc non Af Amer: >60 mL/min (ref >60)
Glucose, Bld: 121 mg/dL — ABNORMAL HIGH (ref 70–99)
Potassium: 4.3 mmol/L (ref 3.5–5.1)
Sodium: 133 mmol/L — ABNORMAL LOW (ref 135–145)
Total Bilirubin: 0.9 mg/dL (ref 0.3–1.2)
Total Protein: 7.1 g/dL (ref 6.5–8.1)

## 2020-02-22 LAB — CBC
HCT: 46.9 % (ref 39.0–52.0)
Hemoglobin: 15.4 g/dL (ref 13.0–17.0)
MCH: 30.4 pg (ref 26.0–34.0)
MCHC: 32.8 g/dL (ref 30.0–36.0)
MCV: 92.7 fL (ref 80.0–100.0)
Platelets: 281 10*3/uL (ref 150–400)
RBC: 5.06 MIL/uL (ref 4.22–5.81)
RDW: 13.5 % (ref 11.5–15.5)
WBC: 11.1 10*3/uL — ABNORMAL HIGH (ref 4.0–10.5)
nRBC: 0 % (ref 0.0–0.2)

## 2020-02-22 LAB — LIPASE, BLOOD: Lipase: 31 U/L (ref 11–51)

## 2020-02-22 MED ORDER — SODIUM CHLORIDE 0.9% FLUSH: 3.0000 mL | Freq: Once | INTRAVENOUS | Status: DC

## 2020-02-22 NOTE — ED Triage Notes (Signed)
Pt is here for right sided upper abdominal pain times one week and the pain radiates to back and down spine.  Pt reports he did have some nausea.

## 2020-02-23 DIAGNOSIS — R1011 Right upper quadrant pain: Secondary | ICD-10-CM | POA: Diagnosis not present

## 2020-02-23 DIAGNOSIS — K760 Fatty (change of) liver, not elsewhere classified: Secondary | ICD-10-CM | POA: Diagnosis not present

## 2020-02-23 DIAGNOSIS — N2889 Other specified disorders of kidney and ureter: Secondary | ICD-10-CM | POA: Diagnosis not present

## 2020-02-23 DIAGNOSIS — K5790 Diverticulosis of intestine, part unspecified, without perforation or abscess without bleeding: Secondary | ICD-10-CM | POA: Diagnosis not present

## 2020-02-23 DIAGNOSIS — I700 Atherosclerosis of aorta: Secondary | ICD-10-CM | POA: Diagnosis not present

## 2020-02-23 DIAGNOSIS — R809 Proteinuria, unspecified: Secondary | ICD-10-CM | POA: Diagnosis not present

## 2020-02-23 NOTE — ED Notes (Signed)
Pt did not respond to vitals recheck.

## 2020-02-23 NOTE — ED Notes (Signed)
Pt did not respond when called for vitals recheck 

## 2020-02-24 DIAGNOSIS — K760 Fatty (change of) liver, not elsewhere classified: Secondary | ICD-10-CM | POA: Diagnosis not present

## 2020-02-24 DIAGNOSIS — N289 Disorder of kidney and ureter, unspecified: Secondary | ICD-10-CM | POA: Diagnosis not present

## 2020-02-24 DIAGNOSIS — K5730 Diverticulosis of large intestine without perforation or abscess without bleeding: Secondary | ICD-10-CM | POA: Diagnosis not present

## 2020-02-29 DIAGNOSIS — D3002 Benign neoplasm of left kidney: Secondary | ICD-10-CM | POA: Diagnosis not present

## 2020-03-08 DIAGNOSIS — D49512 Neoplasm of unspecified behavior of left kidney: Secondary | ICD-10-CM | POA: Diagnosis not present

## 2020-03-09 ENCOUNTER — Other Ambulatory Visit: Payer: Self-pay

## 2020-03-09 ENCOUNTER — Encounter: Payer: Self-pay | Admitting: Nurse Practitioner

## 2020-03-09 ENCOUNTER — Ambulatory Visit: Payer: Medicaid Other | Attending: Nurse Practitioner | Admitting: Nurse Practitioner

## 2020-03-09 DIAGNOSIS — G894 Chronic pain syndrome: Secondary | ICD-10-CM

## 2020-03-09 DIAGNOSIS — J42 Unspecified chronic bronchitis: Secondary | ICD-10-CM | POA: Diagnosis not present

## 2020-03-09 DIAGNOSIS — Z09 Encounter for follow-up examination after completed treatment for conditions other than malignant neoplasm: Secondary | ICD-10-CM | POA: Diagnosis not present

## 2020-03-09 DIAGNOSIS — I10 Essential (primary) hypertension: Secondary | ICD-10-CM | POA: Diagnosis not present

## 2020-03-09 DIAGNOSIS — E119 Type 2 diabetes mellitus without complications: Secondary | ICD-10-CM | POA: Diagnosis not present

## 2020-03-09 MED ORDER — LISINOPRIL 40 MG PO TABS: 40.0000 mg | ORAL_TABLET | Freq: Every day | ORAL | 1 refills | Status: DC

## 2020-03-09 MED ORDER — AMLODIPINE BESYLATE 5 MG PO TABS: 5.0000 mg | ORAL_TABLET | Freq: Every day | ORAL | 1 refills | Status: DC

## 2020-03-09 MED ORDER — DULOXETINE HCL 60 MG PO CPEP: 60.0000 mg | ORAL_CAPSULE | Freq: Every day | ORAL | 1 refills | Status: DC

## 2020-03-09 MED ORDER — ALBUTEROL SULFATE (2.5 MG/3ML) 0.083% IN NEBU: 2.5000 mg | INHALATION_SOLUTION | Freq: Four times a day (QID) | RESPIRATORY_TRACT | 1 refills | Status: DC | PRN

## 2020-03-09 MED ORDER — METFORMIN HCL 500 MG PO TABS: 500.0000 mg | ORAL_TABLET | Freq: Two times a day (BID) | ORAL | 1 refills | Status: DC

## 2020-03-09 MED ORDER — AMLODIPINE BESYLATE 10 MG PO TABS: 10.0000 mg | ORAL_TABLET | Freq: Every day | ORAL | 1 refills | Status: DC

## 2020-03-09 MED ORDER — METOPROLOL TARTRATE 50 MG PO TABS: 50.0000 mg | ORAL_TABLET | Freq: Two times a day (BID) | ORAL | 1 refills | Status: DC

## 2020-03-09 NOTE — Progress Notes (Signed)
Virtual Visit via Telephone Note Due to national recommendations of social distancing due to [place] 19, telehealth visit is felt to be most appropriate for this patient at this time.  I discussed the limitations, risks, security and privacy concerns of performing an evaluation and management service by telephone and the availability of in person appointments. I also discussed with the patient that there may be a patient responsible charge related to this service. The patient expressed understanding and agreed to proceed.    I connected with [place] on 03/09/20  at   3:50 PM EDT  EDT by telephone and verified that I am speaking with the correct person using two identifiers.   Consent I discussed the limitations, risks, security and privacy concerns of performing an evaluation and management service by telephone and the availability of in person appointments. I also discussed with the patient that there may be a patient responsible charge related to this service. The patient expressed understanding and agreed to proceed.   Location of Patient: Private Residence   Location of Provider: [place] and [place] participating in Telemedicine visit: Geryl Rankins FNP-BC [place]    History of Present Illness: Telemedicine visit for: HFU  He was evaluated at [facility] Emergency department on 02-23-2020 for complaints of 10 day onset of RUQ pain with radiation to his back along with nausea and vomiting. Imaging revealed left renal mass.  Urology was consulted and he states today he is being scheduled for left nephrectomy. Being followed by [facility].   DM TYPE 2 Fasting readings as high as 180s. He states this is due to the coffee he is drinking with creamer and sugar. Takes gabapentin for hyperglycemic symptoms of neuropathy.  He is currently taking metformin 500 mg BID. On ACE and STATIN. LDL at goal.  Lab Results  Component Value Date    HGBA1C 6.8 (H) 11/17/2019   Lab Results  Component Value Date   LDLCALC 51 08/18/2019   Essential Hypertension Currently taking lisinopril 40 mg daily, lopressor 50mg  BID, amlodipine 5mg  daily. Will increase amlodipine from 5mg  to 10 mg daily. Denies chest pain, shortness of breath, palpitations, lightheadedness, dizziness, headaches  BP Readings from Last 3 Encounters:  02/22/20 (!) 151/100  12/08/19 130/80  11/17/19 (!) 135/92    Past Medical History:  Diagnosis Date  . Anxiety   . Asthma    daily inhaler  . Cancer ([place])    pt. reports diagnosed with skin cancer on face/uncertain type but he reports may be basal cell, pending surgery to remove 09/03/18  . CHF (congestive heart failure) ([place])    no cardiologist  . Chronic cough    states due to COPD  . Chronic lower back pain   . COPD (chronic obstructive pulmonary disease) (HCC)    no home O2  . Dyspnea    with humidity and high pollen count  . Edentulous    upper gum  . GERD (gastroesophageal reflux disease)   . Gout   . History of Clostridium difficile infection 2014  . Hypercholesterolemia   . Hypertension    states under control with med., has been on med. x 4 yr.  . Insulin dependent diabetes mellitus   . Osteoarthritis    bilat. hip  . Panic attacks   . Shoulder pain, right 04/18/2017    Past Surgical History:  Procedure Laterality Date  . COLON SURGERY     14 inches of  colon removed  . COLONOSCOPY WITH PROPOFOL N/A 04/01/2013   Procedure: COLONOSCOPY WITH PROPOFOL;  Surgeon: Arta Silence, MD;  [facility];  Service: Endoscopy;  Laterality: N/A;  . FLEXIBLE SIGMOIDOSCOPY N/A 02/28/2013   Procedure: FLEXIBLE SIGMOIDOSCOPY;  Surgeon: Winfield Cunas., MD;  Location[facility];  Service: Endoscopy;  Laterality: N/A;  . LAPAROSCOPIC APPENDECTOMY  07/05/2009  . LAPAROSCOPIC LOW ANTERIOR RESECTION N/A 07/08/2013   Procedure: LAPAROSCOPIC LOW ANTERIOR RESECTION WITH TAKEDOWN OF SPLENIC FLEXURE.;   Surgeon: Adin Hector, MD;  Location: [place];  Service: General;  Laterality: N/A;  . MANDIBLE SURGERY Left 2010   "dry skin pocket cut out"  . MULTIPLE EXTRACTIONS WITH ALVEOLOPLASTY N/A 04/24/2017   Procedure: SURGICAL REMOVAL OF TEETH NUMBERS `7 20-29 AND 31;  Surgeon: Michael Litter, DMD;  Location: [place];  Service: Oral Surgery;  Laterality: N/A;  . TOTAL HIP ARTHROPLASTY Left 06/25/2016   Procedure: TOTAL HIP ARTHROPLASTY ANTERIOR APPROACH;  Surgeon: Frederik Pear, MD;  Location: [place];  Service: Orthopedics;  Laterality: Left;  . TOTAL HIP ARTHROPLASTY Right 09/10/2016   Procedure: TOTAL HIP ARTHROPLASTY ANTERIOR APPROACH;  Surgeon: Frederik Pear, MD;  Location: [place];  Service: Orthopedics;  Laterality: Right;    Family History  Problem Relation Age of Onset  . Cancer Father        lymphoma  . Heart attack Mother     Social History   Socioeconomic History  . Marital status: Single    Spouse name: Not on file  . Number of children: Not on file  . Years of education: Not on file  . Highest education level: Not on file  Occupational History  . Not on file  Tobacco Use  . Smoking status: Current Every Day Smoker    Packs/day: 0.00    Last attempt to quit: 06/11/2016    Years since quitting: 3.7  . Smokeless tobacco: Former Systems developer    Types: Secondary school teacher  . Vaping Use: Never used  Substance and Sexual Activity  . Alcohol use: Yes    Comment: 3-6 drinks/day  . Drug use: No  . Sexual activity: Yes  Other Topics Concern  . Not on file  Social History Narrative  . Not on file   Social Determinants of Health   Financial Resource Strain:   . Difficulty of Paying Living Expenses:   Food Insecurity:   . Worried About Charity fundraiser in the Last Year:   . Arboriculturist in the Last Year:   Transportation Needs:   . Film/video editor (Medical):   [name] Lack of Transportation (Non-Medical):   Physical Activity:   . Days of Exercise per Week:   .  Minutes of Exercise per Session:   Stress:   . Feeling of Stress :   Social Connections:   . Frequency of Communication with Friends and Family:   . Frequency of Social Gatherings with Friends and Family:   . Attends Religious Services:   . Active Member of Clubs or Organizations:   . Attends Archivist Meetings:   [name] Marital Status:      Observations/Objective: Awake, alert and oriented x 3   Review of Systems  Constitutional: Negative for fever, malaise/fatigue and weight loss.  HENT: Negative.  Negative for nosebleeds.   Eyes: Negative.  Negative for blurred vision, double vision and photophobia.  Respiratory: Negative.  Negative for cough and shortness of breath.   Cardiovascular: Negative.  Negative for chest pain, palpitations and leg swelling.  Gastrointestinal: Negative.  Negative for heartburn, nausea and vomiting.  Genitourinary: Negative.  Negative for dysuria, flank pain, frequency, hematuria and urgency.  Musculoskeletal: Negative.  Negative for myalgias.  Neurological: Negative.  Negative for dizziness, focal weakness, seizures and headaches.  Psychiatric/Behavioral: Negative.  Negative for suicidal ideas.    Assessment and Plan: [place] was seen today for follow-up.  Diagnoses and all orders for this visit:  Hospital discharge follow-up  Type 2 diabetes mellitus treated without insulin ([place]) -     metFORMIN (GLUCOPHAGE) 500 MG tablet; Take 1 tablet (500 mg total) by mouth 2 (two) times daily with a meal. Continue blood sugar control as discussed in office today, low carbohydrate diet, and regular physical exercise as tolerated, 150 minutes per week (30 min each day, 5 days per week, or 50 min 3 days per week). Keep blood sugar logs with fasting goal of 90-130 mg/dl, post prandial (after you eat) less than 180.  For Hypoglycemia: BS <60 and Hyperglycemia BS >400; contact the clinic ASAP. Annual eye exams and foot exams are recommended.   Chronic  bronchitis, unspecified chronic bronchitis type (HCC) -     albuterol (PROVENTIL) (2.5 MG/3ML) 0.083% nebulizer solution; Take 3 mLs (2.5 mg total) by nebulization every 6 (six) hours as needed for wheezing or shortness of breath.  Essential hypertension -     Discontinue: amLODipine (NORVASC) 5 MG tablet; Take 1 tablet (5 mg total) by mouth at bedtime. -     metoprolol tartrate (LOPRESSOR) 50 MG tablet; Take 1 tablet (50 mg total) by mouth 2 (two) times daily. -     lisinopril (ZESTRIL) 40 MG tablet; Take 1 tablet (40 mg total) by mouth daily. -     amLODipine (NORVASC) 10 MG tablet; Take 1 tablet (10 mg total) by mouth at bedtime. Continue all antihypertensives as prescribed.  Remember to bring in your blood pressure log with you for your follow up appointment.  DASH/Mediterranean Diets are healthier choices for HTN.   Chronic pain syndrome -     DULoxetine (CYMBALTA) 60 MG capsule; Take 1 capsule (60 mg total) by mouth daily.     Follow Up Instructions Return in about 3 months (around 06/09/2020).     I discussed the assessment and treatment plan with the patient. The patient was provided an opportunity to ask questions and all were answered. The patient agreed with the plan and demonstrated an understanding of the instructions.   The patient was advised to call back or seek an in-person evaluation if the symptoms worsen or if the condition fails to improve as anticipated.  I provided 18 minutes of non-face-to-face time during this encounter including median intraservice time, reviewing previous notes, labs, imaging, medications and explaining diagnosis and management.  Gildardo Pounds, FNP-BC

## 2020-03-14 ENCOUNTER — Telehealth: Payer: Self-pay | Admitting: Cardiology

## 2020-03-14 NOTE — Telephone Encounter (Signed)
Preston [name] 49 year old male is requesting cardiac clearance for left robotic assisted left partial nephrectomy.  May his aspirin be held for the procedure?  He was last seen by you in clinic on 12/08/2019.  During that time he described episodes of syncope.  He was sent to EP who did ETT (nondiagnostic due to exercise intolerance) one-to-one AV conduction without episodes of AV block, and diagnosed him with cough and syncope.  He has denied chest pain, palpitations, shortness of breath, lower extremity edema, and PND.  Echocardiogram 10/28/2019 showed normal LVEF.  Please direct response to CV DIV preop pool.  Thank you for your help.  Preston [name]. Preston Covello NP-C    03/14/2020, 2:49 PM [address] Office [phone] Fax [phone]

## 2020-03-14 NOTE — Telephone Encounter (Signed)
       [facility] Pre-operative Risk Assessment    HEARTCARE STAFF: - Please ensure there is not already an duplicate clearance open for this procedure. - Under Visit Info/Reason for Call, type in Other and utilize the format Clearance MM/DD/YY or Clearance TBD. Do not use dashes or single digits. - If request is for dental extraction, please clarify the # of teeth to be extracted.  Request for surgical clearance:  1. What type of surgery is being performed? left robotic assisted laparoscopic partial nephrectomy    2. When is this surgery scheduled? TBD   3. What type of clearance is required (medical clearance vs. Pharmacy clearance to hold med vs. Both)?Both  4. Are there any medications that need to be held prior to surgery and how long? Aspirin 325 mg. 5 days prior surgery  5. Practice name and name of physician performing surgery? Dr. Raynelle Bring   6. What is the office phone number? [place]   7.   What is the office fax number? [phone]  8.   Anesthesia type (None, local, MAC, general) ? General   Preston [name] 03/14/2020, 2:11 PM  _________________________________________________________________   (provider comments below)

## 2020-03-15 NOTE — Telephone Encounter (Signed)
Aspirin may be held for 7 days prior to procedure Candee Furbish, MD

## 2020-03-16 ENCOUNTER — Other Ambulatory Visit: Payer: Self-pay

## 2020-03-16 ENCOUNTER — Other Ambulatory Visit: Payer: Self-pay | Admitting: Nurse Practitioner

## 2020-03-16 ENCOUNTER — Ambulatory Visit: Payer: Medicaid Other | Attending: Family Medicine

## 2020-03-16 DIAGNOSIS — E871 Hypo-osmolality and hyponatremia: Secondary | ICD-10-CM

## 2020-03-16 DIAGNOSIS — E119 Type 2 diabetes mellitus without complications: Secondary | ICD-10-CM

## 2020-03-17 LAB — BASIC METABOLIC PANEL
BUN/Creatinine Ratio: 7 — ABNORMAL LOW (ref 9–20)
BUN: 5 mg/dL — ABNORMAL LOW (ref 6–24)
CO2: 22 mmol/L (ref 20–29)
Calcium: 9.6 mg/dL (ref 8.7–10.2)
Chloride: 97 mmol/L (ref 96–106)
Creatinine, Ser: 0.75 mg/dL — ABNORMAL LOW (ref 0.76–1.27)
GFR calc Af Amer: 125 mL/min/{1.73_m2} (ref >59)
GFR calc non Af Amer: 108 mL/min/{1.73_m2} (ref >59)
Glucose: 163 mg/dL — ABNORMAL HIGH (ref 65–99)
Potassium: 4.8 mmol/L (ref 3.5–5.2)
Sodium: 136 mmol/L (ref 134–144)

## 2020-03-17 LAB — HEMOGLOBIN A1C
Est. average glucose Bld gHb Est-mCnc: 146 mg/dL
Hgb A1c MFr Bld: 6.7 % — ABNORMAL HIGH (ref 4.8–5.6)

## 2020-03-18 NOTE — Telephone Encounter (Signed)
I spoke with the patient, he denies any chest pain or shortness of breath. Last echo in March 2021 was normal. Recent ETT did not show high grade conduction disease however was equivocal as a stress test given very poor exercise tolerance. Talking with the patient, he says he has a bad hip and bad back, the most strenuous activity he does is walk 40 feet to the mail box and back. I did inform the patient that he will need to hold aspirin prior to the surgery.   Dr. Marlou Porch, do you recommend additional studies at this point?  Please send your response to p cv div preop.  Thank you  Preston [name]

## 2020-03-18 NOTE — Telephone Encounter (Signed)
Preston [name] is returning call.

## 2020-03-19 NOTE — Telephone Encounter (Signed)
No additional studies needed Candee Furbish, MD

## 2020-03-19 NOTE — Telephone Encounter (Signed)
   Primary Cardiologist: Candee Furbish, MD  Chart reviewed as part of pre-operative protocol coverage. Given past medical history and time since last visit, based on ACC/AHA guidelines, Preston [name] would be at acceptable risk for the planned procedure without further cardiovascular testing.   I will route this recommendation to the requesting party via Epic fax function and remove from pre-op pool.  Please call with questions. Per Dr. Marlou Porch, patient may hold aspirin for 7 days prior to the surgery and restart as soon as possible after the procedure at the surgeon's discretion.   [place], [place] 03/19/2020, 3:25 PM

## 2020-03-22 ENCOUNTER — Other Ambulatory Visit: Payer: Self-pay | Admitting: Urology

## 2020-04-01 ENCOUNTER — Other Ambulatory Visit: Payer: Self-pay | Admitting: Urology

## 2020-04-01 DIAGNOSIS — D49512 Neoplasm of unspecified behavior of left kidney: Secondary | ICD-10-CM

## 2020-04-05 ENCOUNTER — Encounter: Payer: Self-pay | Admitting: Cardiology

## 2020-04-05 ENCOUNTER — Ambulatory Visit (INDEPENDENT_AMBULATORY_CARE_PROVIDER_SITE_OTHER): Payer: Medicaid Other | Admitting: Cardiology

## 2020-04-05 ENCOUNTER — Other Ambulatory Visit: Payer: Self-pay

## 2020-04-05 VITALS — BP 158/84 | HR 105 | Ht 75.0 in | Wt 298.4 lb

## 2020-04-05 DIAGNOSIS — I10 Essential (primary) hypertension: Secondary | ICD-10-CM | POA: Diagnosis not present

## 2020-04-05 DIAGNOSIS — Z01810 Encounter for preprocedural cardiovascular examination: Secondary | ICD-10-CM

## 2020-04-05 NOTE — Progress Notes (Signed)
Cardiology Office Note:    Date:  04/05/2020   ID:  DSEAN VANTOL, DOB 1970-11-16, MRN [id number]  PCP:  Gildardo Pounds, NP  [facility] Cardiologist:  Candee Furbish, MD  [place] Electrophysiologist:  None   Referring MD: Gildardo Pounds, NP     History of Present Illness:    Preston [name] is a 49 y.o. male with prior cough related syncope, saw Dr. Rayann Heman.  Forceful cough, transient loss of consciousness on 2 separate occasions.  Shortness of breath with moderate activity.  Past Medical History:  Diagnosis Date  . Anxiety   . Asthma    daily inhaler  . Cancer ([place])    pt. reports diagnosed with skin cancer on face/uncertain type but he reports may be basal cell, pending surgery to remove 09/03/18  . CHF (congestive heart failure) ([place])    no cardiologist  . Chronic cough    states due to COPD  . Chronic lower back pain   . COPD (chronic obstructive pulmonary disease) (HCC)    no home O2  . Dyspnea    with humidity and high pollen count  . Edentulous    upper gum  . GERD (gastroesophageal reflux disease)   . Gout   . History of Clostridium difficile infection 2014  . Hypercholesterolemia   . Hypertension    states under control with med., has been on med. x 4 yr.  . Insulin dependent diabetes mellitus   . Osteoarthritis    bilat. hip  . Panic attacks   . Shoulder pain, right 04/18/2017    Past Surgical History:  Procedure Laterality Date  . COLON SURGERY     14 inches of colon removed  . COLONOSCOPY WITH PROPOFOL N/A 04/01/2013   Procedure: COLONOSCOPY WITH PROPOFOL;  Surgeon: Arta Silence, MD;  [facility];  Service: Endoscopy;  Laterality: N/A;  . FLEXIBLE SIGMOIDOSCOPY N/A 02/28/2013   Procedure: FLEXIBLE SIGMOIDOSCOPY;  Surgeon: Winfield Cunas., MD;  Location: [facility]COPY;  Service: Endoscopy;  Laterality: N/A;  . LAPAROSCOPIC APPENDECTOMY  07/05/2009  . LAPAROSCOPIC LOW ANTERIOR RESECTION N/A 07/08/2013   Procedure: LAPAROSCOPIC  LOW ANTERIOR RESECTION WITH TAKEDOWN OF SPLENIC FLEXURE.;  Surgeon: Adin Hector, MD;  Location: [place];  Service: General;  Laterality: N/A;  . MANDIBLE SURGERY Left 2010   "dry skin pocket cut out"  . MULTIPLE EXTRACTIONS WITH ALVEOLOPLASTY N/A 04/24/2017   Procedure: SURGICAL REMOVAL OF TEETH NUMBERS `7 20-29 AND 31;  Surgeon: Michael Litter, DMD;  Location: [place];  Service: Oral Surgery;  Laterality: N/A;  . TOTAL HIP ARTHROPLASTY Left 06/25/2016   Procedure: TOTAL HIP ARTHROPLASTY ANTERIOR APPROACH;  Surgeon: Frederik Pear, MD;  Location: [place];  Service: Orthopedics;  Laterality: Left;  . TOTAL HIP ARTHROPLASTY Right 09/10/2016   Procedure: TOTAL HIP ARTHROPLASTY ANTERIOR APPROACH;  Surgeon: Frederik Pear, MD;  Location: [place];  Service: Orthopedics;  Laterality: Right;    Current Medications: Current Meds  Medication Sig  . albuterol (PROVENTIL) (2.5 MG/3ML) 0.083% nebulizer solution Take 3 mLs (2.5 mg total) by nebulization every 6 (six) hours as needed for wheezing or shortness of breath.  [name] albuterol (VENTOLIN HFA) 108 (90 Base) MCG/ACT inhaler INHALE 2 PUFFS BY MOUTH EVERY 6 HOURS AS NEEDED FOR WHEEZING OR SHORTNESS OF BREATH (COUGH)  . allopurinol (ZYLOPRIM) 300 MG tablet Take 1 tablet (300 mg total) by mouth at bedtime.  [name] amLODipine (NORVASC) 10 MG tablet Take 1 tablet (10 mg  total) by mouth at bedtime.  [name] atorvastatin (LIPITOR) 40 MG tablet Take 1 tablet (40 mg total) by mouth every evening.  . Blood Glucose Monitoring Suppl (ACCU-CHEK AVIVA PLUS) w/Device KIT 1 each by Does not apply route 3 (three) times daily.  . calcium carbonate (TUMS EX) 750 MG chewable tablet Chew 1 tablet by mouth 3 (three) times daily as needed for heartburn.  . Colchicine (MITIGARE) 0.6 MG CAPS Take 0.6 mg by mouth daily as needed. M10.9  . cyclobenzaprine (FLEXERIL) 10 MG tablet Take 1 tablet (10 mg total) by mouth 2 (two) times daily as needed for muscle spasms.  . DULoxetine  (CYMBALTA) 60 MG capsule Take 1 capsule (60 mg total) by mouth daily.  . famotidine (PEPCID) 20 MG tablet Take 1 tablet by mouth once daily  . gabapentin (NEURONTIN) 300 MG capsule Take 2 capsules (600 mg total) by mouth 3 (three) times daily.  [name] glucose blood (ACCU-CHEK AVIVA PLUS) test strip Use as instructed for 3 times daily testing of blood sugar. E11.9  . hydrOXYzine (ATARAX/VISTARIL) 50 MG tablet Take 1 tablet (50 mg total) by mouth 3 (three) times daily as needed. for anxiety  . ibuprofen (ADVIL) 600 MG tablet Take 1 tablet (600 mg total) by mouth every 8 (eight) hours as needed for mild pain or moderate pain.  [name] lisinopril (ZESTRIL) 40 MG tablet Take 1 tablet (40 mg total) by mouth daily.  . metFORMIN (GLUCOPHAGE) 500 MG tablet Take 1 tablet (500 mg total) by mouth 2 (two) times daily with a meal.  . metoprolol tartrate (LOPRESSOR) 50 MG tablet Take 1 tablet (50 mg total) by mouth 2 (two) times daily.     Allergies:   Morphine, Morphine and related, Adhesive [tape], and Other   Social History   Socioeconomic History  . Marital status: Single    Spouse name: Not on file  . Number of children: Not on file  . Years of education: Not on file  . Highest education level: Not on file  Occupational History  . Not on file  Tobacco Use  . Smoking status: Current Every Day Smoker    Packs/day: 0.00    Last attempt to quit: 06/11/2016    Years since quitting: 3.8  . Smokeless tobacco: Former Systems developer    Types: Secondary school teacher  . Vaping Use: Never used  Substance and Sexual Activity  . Alcohol use: Yes    Comment: 3-6 drinks/day  . Drug use: No  . Sexual activity: Yes  Other Topics Concern  . Not on file  Social History Narrative  . Not on file   Social Determinants of Health   Financial Resource Strain:   . Difficulty of Paying Living Expenses: Not on file  Food Insecurity:   . Worried About Charity fundraiser in the Last Year: Not on file  . Ran Out of Food in the Last  Year: Not on file  Transportation Needs:   . Lack of Transportation (Medical): Not on file  . Lack of Transportation (Non-Medical): Not on file  Physical Activity:   . Days of Exercise per Week: Not on file  . Minutes of Exercise per Session: Not on file  Stress:   . Feeling of Stress : Not on file  Social Connections:   . Frequency of Communication with Friends and Family: Not on file  . Frequency of Social Gatherings with Friends and Family: Not on file  . Attends Religious Services: Not on file  .  Active Member of Clubs or Organizations: Not on file  . Attends Archivist Meetings: Not on file  . Marital Status: Not on file     Family History: The patient's family history includes Cancer in his father; Heart attack in his mother.  ROS:   Please see the history of present illness.     All other systems reviewed and are negative.  EKGs/Labs/Other Studies Reviewed:    Recent Labs: 02/22/2020: ALT 66; Hemoglobin 15.4; Platelets 281 03/16/2020: BUN 5; Creatinine, Ser 0.75; Potassium 4.8; Sodium 136  Recent Lipid Panel    Component Value Date/Time   CHOL 164 08/18/2019 1217   TRIG 508 (H) 08/18/2019 1217   HDL 38 (L) 08/18/2019 1217   CHOLHDL 4.3 08/18/2019 1217   CHOLHDL 5.6 12/13/2016 0605   VLDL UNABLE TO CALCULATE IF TRIGLYCERIDE OVER 400 mg/dL 12/13/2016 0605   LDLCALC 51 08/18/2019 1217    Physical Exam:    VS:  BP (!) 158/84   Pulse (!) 105   Ht _0  (1.905 m)   Wt 298 lb 6.4 oz (135.4 kg)   SpO2 97%   BMI 37.30 kg/m     Wt Readings from Last 3 Encounters:  04/05/20 298 lb 6.4 oz (135.4 kg)  12/08/19 (!) 303 lb (137.4 kg)  11/17/19 (!) 301 lb (136.5 kg)     GEN:  Well nourished, well developed in no acute distress HEENT: Normal NECK: No JVD; No carotid bruits LYMPHATICS: No lymphadenopathy CARDIAC: RRR, no murmurs, rubs, gallops RESPIRATORY:  Clear to auscultation without rales, wheezing or rhonchi  ABDOMEN: Soft, non-tender,  non-distended MUSCULOSKELETAL:  No edema; No deformity  SKIN: Warm and dry NEUROLOGIC:  Alert and oriented x 3 PSYCHIATRIC:  Normal affect   ASSESSMENT:    1. Pre-operative cardiovascular examination   2. Essential hypertension, benign   3. Morbid obesity ([place])    PLAN:    In order of problems listed above:  Kidney mass-preop visit --Not having any chest discomfort.  Exercise tolerance was reduced however deconditioning noted.  ETT reviewed.  Prior chest CT in 2017 did not show any evidence of coronary artery calcification.  EKG today unrevealing.  He may proceed with partial nephrectomy with low to moderate overall cardiac.  Cough related syncope -Has not had any syncope unless forceful cough present.  Understands precautions.  Dr. Rayann Heman, no indication for pacemaker.  Mobitz type I second-degree AV block -Noted on event monitor.  Vagal type event.  May have been asleep at the time.  No changes.  Exertional shortness of breath -ETT performed.  Poor exercise tolerance.  No high risk induction issues.  Echocardiogram reassuring.  Hyperlipidemia -Continue with atorvastatin 40 mg once a day.  No myalgias.  Labs as below.  Morbid obesity -BMI greater than 35 with 2 or more comorbidities-continue to encourage weight loss, conditioning.  Outside labs reviewed-LDL 51 hemoglobin 15.4 creatinine 0.5 ALT 42  We will see him back in 1 year.   Medication Adjustments/Labs and Tests Ordered: Current medicines are reviewed at length with the patient today.  Concerns regarding medicines are outlined above.  Orders Placed This Encounter  Procedures  . EKG 12-Lead   No orders of the defined types were placed in this encounter.   Patient Instructions  Medication Instructions:  The current medical regimen is effective;  continue present plan and medications.  *If you need a refill on your cardiac medications before your next appointment, please call your pharmacy*  Follow-Up: At  [facility], you and your health needs are our priority.  As part of our continuing mission to provide you with exceptional heart care, we have created designated Provider Care Teams.  These Care Teams include your primary Cardiologist (physician) and Advanced Practice Providers (APPs -  Physician Assistants and Nurse Practitioners) who all work together to provide you with the care you need, when you need it.  We recommend signing up for the patient portal called "MyChart".  Sign up information is provided on this After Visit Summary.  MyChart is used to connect with patients for Virtual Visits (Telemedicine).  Patients are able to view lab/test results, encounter notes, upcoming appointments, etc.  Non-urgent messages can be sent to your provider as well.   To learn more about what you can do with MyChart, go to [email or web address].    Your next appointment:   12 month(s)  The format for your next appointment:   In Person  Provider:   Candee Furbish, MD   Thank you for choosing [facility]!!        Signed, Candee Furbish, MD  04/05/2020 12:23 PM    [place]

## 2020-04-05 NOTE — Patient Instructions (Signed)
Medication Instructions:  The current medical regimen is effective;  continue present plan and medications.  *If you need a refill on your cardiac medications before your next appointment, please call your pharmacy*  Follow-Up: At [facility], you and your health needs are our priority.  As part of our continuing mission to provide you with exceptional heart care, we have created designated Provider Care Teams.  These Care Teams include your primary Cardiologist (physician) and Advanced Practice Providers (APPs -  Physician Assistants and Nurse Practitioners) who all work together to provide you with the care you need, when you need it.  We recommend signing up for the patient portal called "MyChart".  Sign up information is provided on this After Visit Summary.  MyChart is used to connect with patients for Virtual Visits (Telemedicine).  Patients are able to view lab/test results, encounter notes, upcoming appointments, etc.  Non-urgent messages can be sent to your provider as well.   To learn more about what you can do with MyChart, go to [email or web address].    Your next appointment:   12 month(s)  The format for your next appointment:   In Person  Provider:   Mark Skains, MD   Thank you for choosing [facility]!!      

## 2020-04-08 ENCOUNTER — Other Ambulatory Visit: Payer: Self-pay | Admitting: Nurse Practitioner

## 2020-04-08 DIAGNOSIS — C642 Malignant neoplasm of left kidney, except renal pelvis: Secondary | ICD-10-CM | POA: Diagnosis not present

## 2020-04-08 DIAGNOSIS — I700 Atherosclerosis of aorta: Secondary | ICD-10-CM | POA: Diagnosis not present

## 2020-04-08 DIAGNOSIS — N2889 Other specified disorders of kidney and ureter: Secondary | ICD-10-CM | POA: Diagnosis not present

## 2020-04-08 DIAGNOSIS — D49512 Neoplasm of unspecified behavior of left kidney: Secondary | ICD-10-CM | POA: Diagnosis not present

## 2020-04-08 DIAGNOSIS — I10 Essential (primary) hypertension: Secondary | ICD-10-CM

## 2020-04-10 ENCOUNTER — Ambulatory Visit (HOSPITAL_COMMUNITY)
Admission: RE | Admit: 2020-04-10 | Discharge: 2020-04-10 | Disposition: A | Discharge status: Home / Self Care | Payer: Medicaid Other | Source: Ambulatory Visit | Attending: Urology | Admitting: Urology

## 2020-04-10 ENCOUNTER — Other Ambulatory Visit: Payer: Self-pay

## 2020-04-10 DIAGNOSIS — C641 Malignant neoplasm of right kidney, except renal pelvis: Secondary | ICD-10-CM | POA: Diagnosis not present

## 2020-04-10 DIAGNOSIS — C642 Malignant neoplasm of left kidney, except renal pelvis: Secondary | ICD-10-CM | POA: Diagnosis not present

## 2020-04-10 DIAGNOSIS — N281 Cyst of kidney, acquired: Secondary | ICD-10-CM | POA: Diagnosis not present

## 2020-04-10 DIAGNOSIS — D49512 Neoplasm of unspecified behavior of left kidney: Secondary | ICD-10-CM

## 2020-04-10 DIAGNOSIS — N2889 Other specified disorders of kidney and ureter: Secondary | ICD-10-CM | POA: Diagnosis not present

## 2020-04-10 MED ORDER — GADOBUTROL 1 MMOL/ML IV SOLN
10.0000 mL | Freq: Once | INTRAVENOUS | Status: AC | PRN
  Administered 2020-04-10: 10 mL via INTRAVENOUS

## 2020-04-26 DIAGNOSIS — R8271 Bacteriuria: Secondary | ICD-10-CM | POA: Diagnosis not present

## 2020-04-27 NOTE — Progress Notes (Signed)
DUE TO COVID-19 ONLY ONE VISITOR IS ALLOWED TO COME WITH YOU AND STAY IN THE WAITING ROOM ONLY DURING PRE OP AND PROCEDURE DAY OF SURGERY. THE 1 VISITOR  MAY VISIT WITH YOU AFTER SURGERY IN YOUR PRIVATE ROOM DURING VISITING HOURS ONLY!  YOU NEED TO HAVE A COVID 19 TEST ON__10/07/2019 _____ @_______ , THIS TEST MUST BE DONE BEFORE SURGERY,  COVID TESTING SITE [address] [place] [address], IT IS ON THE RIGHT GOING OUT [address] APPROXIMATELY  2 MINUTES PAST [employer] ON THE RIGHT. ONCE YOUR COVID TEST IS COMPLETED,  PLEASE BEGIN THE QUARANTINE INSTRUCTIONS AS OUTLINED IN YOUR HANDOUT.                [place]  04/27/2020   Your procedure is scheduled on: 05/02/2020    Report to [facility] Main  Entrance   Report to admitting at   [place] AM     Call this number if you have problems the morning of surgery [phone]    Remember: Do not eat food , candy gum or mints :After Midnight. You may have clear liquids from midnight until   0415am   Magnesium Citrate - 1/2 bottle by 12 noon day before surgery.   CLEAR LIQUID DIET   Foods Allowed                                                                       Coffee and tea, regular and decaf                              Plain Jell-O any favor except red or purple                                            Fruit ices (not with fruit pulp)                                      Iced Popsicles                                     Carbonated beverages, regular and diet                                    Cranberry, grape and apple juices Sports drinks like Gatorade Lightly seasoned clear broth or consume(fat free) Sugar, honey syrup   _____________________________________________________________________    BRUSH YOUR TEETH MORNING OF SURGERY AND RINSE YOUR MOUTH OUT, NO CHEWING GUM CANDY OR MINTS.     Take these medicines the morning of surgery with A SIP OF WATER:    Metoproplol, gabapentin,  nebulizer if needed DO NOT TAKE ANY DIABETIC MEDICATIONS DAY OF YOUR SURGERY  You may not have any metal on your body including hair pins and              piercings  Do not wear jewelry, make-up, lotions, powders or perfumes, deodorant             Do not wear nail polish on your fingernails.  Do not shave  48 hours prior to surgery.              Men may shave face and neck.   Do not bring valuables to the hospital. [place].  Contacts, dentures or bridgework may not be worn into surgery.  Leave suitcase in the car. After surgery it may be brought to your room.     Patients discharged the day of surgery will not be allowed to drive home. IF YOU ARE HAVING SURGERY AND GOING HOME THE SAME DAY, YOU MUST HAVE AN ADULT TO DRIVE YOU HOME AND BE WITH YOU FOR 24 HOURS. YOU MAY GO HOME BY TAXI OR UBER OR ORTHERWISE, BUT AN ADULT MUST ACCOMPANY YOU HOME AND STAY WITH YOU FOR 24 HOURS.  Name and phone number of your driver:  Special Instructions: N/A              Please read over the following fact sheets you were given: _____________________________________________________________________  [facility] - Preparing for Surgery Before surgery, you can play an important role.  Because skin is not sterile, your skin needs to be as free of germs as possible.  You can reduce the number of germs on your skin by washing with CHG (chlorahexidine gluconate) soap before surgery.  CHG is an antiseptic cleaner which kills germs and bonds with the skin to continue killing germs even after washing. Please DO NOT use if you have an allergy to CHG or antibacterial soaps.  If your skin becomes reddened/irritated stop using the CHG and inform your nurse when you arrive at Short Stay. Do not shave (including legs and underarms) for at least 48 hours prior to the first CHG shower.  You may shave your face/neck. Please follow these instructions  carefully:  1.  Shower with CHG Soap the night before surgery and the  morning of Surgery.  2.  If you choose to wash your hair, wash your hair first as usual with your  normal  shampoo.  3.  After you shampoo, rinse your hair and body thoroughly to remove the  shampoo.                           4.  Use CHG as you would any other liquid soap.  You can apply chg directly  to the skin and wash                       Gently with a scrungie or clean washcloth.  5.  Apply the CHG Soap to your body ONLY FROM THE NECK DOWN.   Do not use on face/ open                           Wound or open sores. Avoid contact with eyes, ears mouth and genitals (private parts).  Wash face,  Genitals (private parts) with your normal soap.             6.  Wash thoroughly, paying special attention to the area where your surgery  will be performed.  7.  Thoroughly rinse your body with warm water from the neck down.  8.  DO NOT shower/wash with your normal soap after using and rinsing off  the CHG Soap.                9.  Pat yourself dry with a clean towel.            10.  Wear clean pajamas.            11.  Place clean sheets on your bed the night of your first shower and do not  sleep with pets. Day of Surgery : Do not apply any lotions/deodorants the morning of surgery.  Please wear clean clothes to the hospital/surgery center.  FAILURE TO FOLLOW THESE INSTRUCTIONS MAY RESULT IN THE CANCELLATION OF YOUR SURGERY PATIENT SIGNATURE_________________________________  NURSE SIGNATURE__________________________________  ________________________________________________________________________

## 2020-04-28 ENCOUNTER — Other Ambulatory Visit (HOSPITAL_COMMUNITY)
Admission: RE | Admit: 2020-04-28 | Discharge: 2020-04-28 | Disposition: A | Discharge status: Home / Self Care | Payer: Medicaid Other | Source: Ambulatory Visit | Attending: Urology | Admitting: Urology

## 2020-04-28 ENCOUNTER — Encounter (HOSPITAL_COMMUNITY): Payer: Self-pay

## 2020-04-28 ENCOUNTER — Encounter (HOSPITAL_COMMUNITY)
Admission: RE | Admit: 2020-04-28 | Discharge: 2020-04-28 | Disposition: A | Discharge status: Home / Self Care | Payer: Medicaid Other | Source: Ambulatory Visit | Attending: Urology | Admitting: Urology

## 2020-04-28 ENCOUNTER — Other Ambulatory Visit: Payer: Self-pay

## 2020-04-28 DIAGNOSIS — Z01812 Encounter for preprocedural laboratory examination: Secondary | ICD-10-CM | POA: Diagnosis present

## 2020-04-28 DIAGNOSIS — Z20822 Contact with and (suspected) exposure to covid-19: Secondary | ICD-10-CM | POA: Insufficient documentation

## 2020-04-28 LAB — CBC
HCT: 47.7 % (ref 39.0–52.0)
Hemoglobin: 16 g/dL (ref 13.0–17.0)
MCH: 31.3 pg (ref 26.0–34.0)
MCHC: 33.5 g/dL (ref 30.0–36.0)
MCV: 93.3 fL (ref 80.0–100.0)
Platelets: 316 10*3/uL (ref 150–400)
RBC: 5.11 MIL/uL (ref 4.22–5.81)
RDW: 13.2 % (ref 11.5–15.5)
WBC: 11.3 10*3/uL — ABNORMAL HIGH (ref 4.0–10.5)
nRBC: 0 % (ref 0.0–0.2)

## 2020-04-28 LAB — BASIC METABOLIC PANEL
Anion gap: 11 (ref 5–15)
BUN: 7 mg/dL (ref 6–20)
CO2: 24 mmol/L (ref 22–32)
Calcium: 9.6 mg/dL (ref 8.9–10.3)
Chloride: 101 mmol/L (ref 98–111)
Creatinine, Ser: 0.66 mg/dL (ref 0.61–1.24)
GFR calc Af Amer: >60 mL/min (ref >60)
GFR calc non Af Amer: >60 mL/min (ref >60)
Glucose, Bld: 143 mg/dL — ABNORMAL HIGH (ref 70–99)
Potassium: 4.5 mmol/L (ref 3.5–5.1)
Sodium: 136 mmol/L (ref 135–145)

## 2020-04-28 LAB — GLUCOSE, CAPILLARY: Glucose-Capillary: 159 mg/dL — ABNORMAL HIGH (ref 70–99)

## 2020-04-28 LAB — SARS CORONAVIRUS 2 (TAT 6-24 HRS): SARS Coronavirus 2: NEGATIVE

## 2020-04-28 LAB — HEMOGLOBIN A1C
Hgb A1c MFr Bld: 7.4 % — ABNORMAL HIGH (ref 4.8–5.6)
Mean Plasma Glucose: 165.68 mg/dL

## 2020-04-28 NOTE — Progress Notes (Addendum)
Anesthesia Review:  PCP: DR Raul Del at [facility] [place].   Cardiologist :DR Marlou Porch-  04/05/20- preop visit with Dr Marlou Porch- epic  Chest x-ray : EKG :04/05/2020  Echo : 10/28/19 Stress test: 01/10/2020  Cardiac Cath :  Activity level: can not do a flight of stairs without difficulty  Sleep Study/ CPAP : Fasting Blood Sugar :      / Checks Blood Sugar -- times a day:   HGBA1c-7.4 on 04/28/20  Blood Thinner/ Instructions /Last Dose: ASA / Instructions/ Last Dose :  ASA 325 mg- per pt and significan other- pt has not yet received preop instructions.  They are to call Dr Alinda Money office on 04/28/20 for preop instrucitons regarding Aspirin.

## 2020-04-29 NOTE — Progress Notes (Signed)
Anesthesia Chart Review   Case: [id number] Date/Time: 05/02/20 0646   Procedure: XI ROBOTIC ASSITED LAPAROSCOPIC PARTIAL NEPHRECTOMY (Left )   Anesthesia type: General   Pre-op diagnosis: LEFT RENAL NEOPLASM   Location[facility] ROOM 03 / [facility]   Surgeons: Raynelle Bring, MD      DISCUSSION:49 y.o. current every day smoker (35 pack years) with h/o GERD, COPD, asthma, HTN, CHF, DM II insuline dependent, left renal neoplasm scheduled for above procedure 05/02/2020 with Dr. Raynelle Bring.   ETT 01/04/2020.  Per Dr. Marlou Porch, "ETT performed.  Poor exercise tolerance.  No high risk induction issues.  Echocardiogram reassuring."  Pt last seen by cardiology 04/05/2020 for preoperative evaluation.  Per OV note, "Not having any chest discomfort.  Exercise tolerance was reduced however deconditioning noted.  ETT reviewed.  Prior chest CT in 2017 did not show any evidence of coronary artery calcification.  EKG today unrevealing.  He may proceed with partial nephrectomy with low to moderate overall cardiac."  Anticipate pt can proceed with planned procedure barring acute status change.   VS: BP (!) 139/94   Pulse 73   Temp 37.2 C (Oral)   SpO2 98%   PROVIDERS: Gildardo Pounds, NP is PCP   Candee Furbish, MD is Cardiologist  LABS: Labs reviewed: Acceptable for surgery. (all labs ordered are listed, but only abnormal results are displayed)  Labs Reviewed  HEMOGLOBIN A1C - Abnormal; Notable for the following components:      Result Value   Hgb A1c MFr Bld 7.4 (*)    All other components within normal limits  BASIC METABOLIC PANEL - Abnormal; Notable for the following components:   Glucose, Bld 143 (*)    All other components within normal limits  CBC - Abnormal; Notable for the following components:   WBC 11.3 (*)    All other components within normal limits  GLUCOSE, CAPILLARY - Abnormal; Notable for the following components:   Glucose-Capillary 159 (*)    All other components within normal limits   TYPE AND SCREEN     IMAGES:   EKG: 04/05/2020 Rate 105 bpm    CV: Echo 10/28/2019 IMPRESSIONS    1. Left ventricular ejection fraction, by estimation, is 55 to 60%. The  left ventricle has normal function. The left ventricle has no regional  wall motion abnormalities. There is mild concentric left ventricular  hypertrophy. Left ventricular diastolic  parameters are consistent with Grade II diastolic dysfunction  (pseudonormalization). Elevated left atrial pressure.  2. Right ventricular systolic function is normal. The right ventricular  size is normal.  3. Left atrial size was mildly dilated.  4. The mitral valve is normal in structure. No evidence of mitral valve  regurgitation. No evidence of mitral stenosis.  5. The aortic valve is normal in structure. Aortic valve regurgitation is  not visualized. No aortic stenosis is present.  6. The inferior vena cava is normal in size with greater than 50%  respiratory variability, suggesting right atrial pressure of 3 mmHg. Past Medical History:  Diagnosis Date  . Anxiety   . Asthma    daily inhaler  . Cancer ([place])    pt. reports diagnosed with skin cancer on face/uncertain type but he reports may be basal cell, pending surgery to remove 09/03/18  . CHF (congestive heart failure) ([place])    no cardiologist  . Chronic cough    states due to COPD  . Chronic lower back pain   . COPD (chronic obstructive pulmonary disease) ([place])  no home O2  . Dyspnea    with humidity and high pollen count  . Edentulous    upper gum  . GERD (gastroesophageal reflux disease)   . Gout   . History of Clostridium difficile infection 2014  . Hypercholesterolemia   . Hypertension    states under control with med., has been on med. x 4 yr.  . Insulin dependent diabetes mellitus    type 2   . Osteoarthritis    bilat. hip  . Panic attacks   . Shoulder pain, right 04/18/2017    Past Surgical History:  Procedure Laterality Date  . COLON  SURGERY     14 inches of colon removed  . COLONOSCOPY WITH PROPOFOL N/A 04/01/2013   Procedure: COLONOSCOPY WITH PROPOFOL;  Surgeon: Arta Silence, MD;  Location: [facility];  Service: Endoscopy;  Laterality: N/A;  . FLEXIBLE SIGMOIDOSCOPY N/A 02/28/2013   Procedure: FLEXIBLE SIGMOIDOSCOPY;  Surgeon: Winfield Cunas., MD;  Location[facility] ENDOSCOPY;  Service: Endoscopy;  Laterality: N/A;  . LAPAROSCOPIC APPENDECTOMY  07/05/2009  . LAPAROSCOPIC LOW ANTERIOR RESECTION N/A 07/08/2013   Procedure: LAPAROSCOPIC LOW ANTERIOR RESECTION WITH TAKEDOWN OF SPLENIC FLEXURE.;  Surgeon: Adin Hector, MD;  Location: [place];  Service: General;  Laterality: N/A;  . MANDIBLE SURGERY Left 2010   "dry skin pocket cut out"  . MULTIPLE EXTRACTIONS WITH ALVEOLOPLASTY N/A 04/24/2017   Procedure: SURGICAL REMOVAL OF TEETH NUMBERS `7 20-29 AND 31;  Surgeon: Michael Litter, DMD;  Location: [place];  Service: Oral Surgery;  Laterality: N/A;  . TOTAL HIP ARTHROPLASTY Left 06/25/2016   Procedure: TOTAL HIP ARTHROPLASTY ANTERIOR APPROACH;  Surgeon: Frederik Pear, MD;  Location: [place];  Service: Orthopedics;  Laterality: Left;  . TOTAL HIP ARTHROPLASTY Right 09/10/2016   Procedure: TOTAL HIP ARTHROPLASTY ANTERIOR APPROACH;  Surgeon: Frederik Pear, MD;  Location: [place];  Service: Orthopedics;  Laterality: Right;    MEDICATIONS: . albuterol (PROVENTIL) (2.5 MG/3ML) 0.083% nebulizer solution  . albuterol (VENTOLIN HFA) 108 (90 Base) MCG/ACT inhaler  . allopurinol (ZYLOPRIM) 300 MG tablet  . amLODipine (NORVASC) 10 MG tablet  . aspirin 325 MG tablet  . atorvastatin (LIPITOR) 40 MG tablet  . Blood Glucose Monitoring Suppl (ACCU-CHEK AVIVA PLUS) w/Device KIT  . calcium carbonate (TUMS EX) 750 MG chewable tablet  . Colchicine (MITIGARE) 0.6 MG CAPS  . cyclobenzaprine (FLEXERIL) 10 MG tablet  . DULoxetine (CYMBALTA) 60 MG capsule  . famotidine (PEPCID) 20 MG tablet  . gabapentin (NEURONTIN) 300 MG capsule  .  glucose blood (ACCU-CHEK AVIVA PLUS) test strip  . hydrOXYzine (ATARAX/VISTARIL) 50 MG tablet  . ibuprofen (ADVIL) 600 MG tablet  . lisinopril (ZESTRIL) 40 MG tablet  . metFORMIN (GLUCOPHAGE) 500 MG tablet  . metoprolol tartrate (LOPRESSOR) 50 MG tablet   No current facility-administered medications for this encounter.     Preston Felix, PA-C [facility] [phone]

## 2020-04-29 NOTE — Anesthesia Preprocedure Evaluation (Addendum)
Anesthesia Evaluation  Patient identified by MRN, date of birth, ID band Patient awake    Reviewed: Allergy & Precautions, NPO status , Patient's Chart, lab work & pertinent test results  Airway Mallampati: III       Dental  (+) Edentulous Upper, Edentulous Lower   Pulmonary asthma , Current Smoker and Patient abstained from smoking.,    Pulmonary exam normal        Cardiovascular hypertension, Pt. on medications Normal cardiovascular exam     Neuro/Psych PSYCHIATRIC DISORDERS Anxiety Depression    GI/Hepatic Neg liver ROS, GERD  Medicated,  Endo/Other  diabetes, Type 2, Insulin Dependent, Oral Hypoglycemic Agents  Renal/GU      Musculoskeletal  (+) Arthritis , Osteoarthritis,    Abdominal (+) + obese,   Peds  Hematology negative hematology ROS (+)   Anesthesia Other Findings   Reproductive/Obstetrics                           Anesthesia Physical Anesthesia Plan  ASA: II  Anesthesia Plan: General   Post-op Pain Management:    Induction: Intravenous  PONV Risk Score and Plan: 3 and Ondansetron, Midazolam and Treatment may vary due to age or medical condition  Airway Management Planned: Oral ETT  Additional Equipment: None  Intra-op Plan:   Post-operative Plan: Extubation in OR  Informed Consent: I have reviewed the patients History and Physical, chart, labs and discussed the procedure including the risks, benefits and alternatives for the proposed anesthesia with the patient or authorized representative who has indicated his/her understanding and acceptance.     Dental advisory given  Plan Discussed with: CRNA  Anesthesia Plan Comments: (See PAT note 04/28/2020, Konrad Felix, PA-C)      Anesthesia Quick Evaluation

## 2020-04-29 NOTE — H&P (Signed)
Office Visit Report     04/26/2020   --------------------------------------------------------------------------------   Preston [name]  MRN: [id number]  DOB: 21-Sep-1970, 49 year old Male  SSN:    PRIMARY CARE:  Geryl Rankins, NP  REFERRING:  Geryl Rankins, NP  PROVIDER:  Franchot Gallo, M.D.  TREATING:  Mcarthur Rossetti, [place]  LOCATION:  [facility] [phone]     --------------------------------------------------------------------------------   CC/HPI: Pt presents today for pre-operative history and physical exam in anticipation of left robot assisted lap partial nephrectomy by Dr. Alinda Money on 05/02/20. He is doing well and is without complaint. Pt denies F/C, HA, CP, SOB, N/V, diarrhea/constipation, flank pain, hematuria, and dysuria.   Received cardiac clearance for surgery from Dr. Marlou Porch on 04/05/20.    HX:   CC: Left renal neoplasm  Physician requesting consult: Dr. Franchot Gallo  Primary care physician: Dr. Geryl Rankins   Preston [name] is a 48 year old gentleman who was incidentally found to have a 3.6 cm left upper pole renal mass that appeared enhancing on a CT scan with contrast on 02/24/20 for right abdominal pain. He was seen by Dr. Diona Fanti who performed a renal ultrasound that suggested a solid mass. He refers him today to discuss minimally invasive partial nephrectomy.   Family history of kidney cancer: None.  Family history of ESRD: No.   Imaging: CT abdomen (02/24/20)  Side of renal neoplasm: Left  Size of renal neoplasm: 3.6  Location of renal neoplasm: Upper pole  Exophytic or endophytic: Mostly exophytic   Renal artery anatomy: Single renal artery  Renal vein anatomy: Single renal vein   Contralateral renal lesions: None.  Regional lymphadenopathy: None.  Adrenal masses: None.  Renal vein/IVC involvement: No.  Metastatic disease to the abdomen: No.   Chest imaging:  LFTs: Normal.   Baseline renal function: Cr 0.8, eGFR > 60  ml/min   PMH: Past medical history is significant for COPD, CHF (EF 45-62%, diastolic dysfunction, followed by Dr. Marlou Porch), anxiety, arthritis, GERD, diabetes, asthma, gout, hyperlipidemia, hypertension, diabetes, obesity.  PSH: Low anterior resection and laparoscopic appendectomy. His incision extends from just above the umbilicus around the right side in down lower midline. This was for diverticular disease.     ALLERGIES: Adhesive Tape Morphine - Nausea, Itching    MEDICATIONS: Allopurinol  Aspirin 325 mg tablet  Diazepam 10 mg tablet Take 10 mg 30-60 minutes prior to your procedure  Lisinopril  Metformin Hcl  Amlodipine Besilate  Atorvastatin Calcium  Famotidine  Gabapentin     GU PSH: Locm 300-399Mg/Ml Iodine,1Ml - 04/08/2020       PSH Notes: Bilateral hip replacements  All teeth removed  Basal cell removal with graft right side of nose     NON-GU PSH: Appendectomy (laparoscopic) Partial Remove Colon, 14 in removed     GU PMH: Left renal neoplasm - 04/08/2020, - 03/08/2020    NON-GU PMH: Anxiety Arthritis Asthma Congestive heart failure Diabetes Type 2 GERD Gout Hypercholesterolemia Hypertension    FAMILY HISTORY: None   SOCIAL HISTORY: Marital Status: Single Preferred Language: English; Ethnicity: Not Hispanic Or Latino; Race: White Current Smoking Status: Patient smokes.   Tobacco Use Assessment Completed: Used Tobacco in last 30 days? Smoking cessation counseling was provided. Does not use smokeless tobacco. Does drink.  Does not use drugs. Drinks 1 caffeinated drink per day. Has not had a blood transfusion.     Notes: Smokes 1 ppd and has for 35 years  ETOH 6 pack per  day    REVIEW OF SYSTEMS:    GU Review Male:   Patient denies frequent urination, hard to postpone urination, burning/ pain with urination, get up at night to urinate, leakage of urine, stream starts and stops, trouble starting your stream, have to strain to urinate , erection  problems, and penile pain.  Gastrointestinal (Upper):   Patient reports indigestion/ heartburn. Patient denies nausea and vomiting.  Gastrointestinal (Lower):   Patient denies diarrhea and constipation.  Constitutional:   Patient denies fever, night sweats, weight loss, and fatigue.  Skin:   Patient denies skin rash/ lesion and itching.  Eyes:   Patient denies blurred vision and double vision.  Ears/ Nose/ Throat:   Patient denies sore throat and sinus problems.  Hematologic/Lymphatic:   Patient denies swollen glands and easy bruising.  Cardiovascular:   Patient denies leg swelling and chest pains.  Respiratory:   Patient denies shortness of breath and cough.  Endocrine:   Patient denies excessive thirst.  Musculoskeletal:   Patient reports back pain and joint pain.   Neurological:   Patient denies headaches and dizziness.  Psychologic:   Patient denies depression and anxiety.   VITAL SIGNS:      04/26/2020 01:05 PM  Weight 298 lb / 135.17 kg  Height 74.5 in / 189.23 cm  BP 144/91 mmHg  Pulse 76 /min  Temperature 96.9 F / 36.0 C  BMI 37.7 kg/m   MULTI-SYSTEM PHYSICAL EXAMINATION:    Constitutional: Well-nourished. No physical deformities. Normally developed. Good grooming.  Neck: Neck symmetrical, not swollen. Normal tracheal position.  Respiratory: Wheezes bilateral No labored breathing, no use of accessory muscles.   Cardiovascular: Regular rate and rhythm. No murmur, no gallop.   Lymphatic: No enlargement of neck, axillae, groin.  Skin: No paleness, no jaundice, no cyanosis. No lesion, no ulcer, no rash.  Neurologic / Psychiatric: Oriented to time, oriented to place, oriented to person. No depression, no anxiety, no agitation.  Gastrointestinal: No mass, no tenderness, no rigidity, non obese abdomen.  Eyes: Normal conjunctivae. Normal eyelids.  Ears, Nose, Mouth, and Throat: Left ear no scars, no lesions, no masses. Right ear no scars, no lesions, no masses. Nose no scars, no  lesions, no masses. Normal hearing. Normal lips.  Musculoskeletal: Normal gait and station of head and neck.     Complexity of Data:  Records Review:   Previous Patient Records  Urine Test Review:   Urinalysis   04/26/20  Urinalysis  Urine Appearance Clear   Urine Color Yellow   Urine Glucose Neg mg/dL  Urine Bilirubin Neg mg/dL  Urine Ketones Neg mg/dL  Urine Specific Gravity 1.020   Urine Blood Neg ery/uL  Urine pH 6.0   Urine Protein Trace mg/dL  Urine Urobilinogen 0.2 mg/dL  Urine Nitrites Neg   Urine Leukocyte Esterase Trace leu/uL  Urine WBC/hpf 0 - 5/hpf   Urine RBC/hpf 0 - 2/hpf   Urine Epithelial Cells NS (Not Seen)   Urine Bacteria Few (10-25/hpf)   Urine Mucous Not Present   Urine Yeast NS (Not Seen)   Urine Trichomonas Not Present   Urine Cystals NS (Not Seen)   Urine Casts NS (Not Seen)   Urine Sperm Not Present    PROCEDURES:          Urinalysis w/Scope - 81001 Dipstick Dipstick Cont'd Micro  Color: Yellow Bilirubin: Neg mg/dL WBC/hpf: 0 - 5/hpf  Appearance: Clear Ketones: Neg mg/dL RBC/hpf: 0 - 2/hpf  Specific Gravity: 1.020 Blood: Neg ery/uL Bacteria:  Few (10-25/hpf)  pH: 6.0 Protein: Trace mg/dL Cystals: NS (Not Seen)  Glucose: Neg mg/dL Urobilinogen: 0.2 mg/dL Casts: NS (Not Seen)    Nitrites: Neg Trichomonas: Not Present    Leukocyte Esterase: Trace leu/uL Mucous: Not Present      Epithelial Cells: NS (Not Seen)      Yeast: NS (Not Seen)      Sperm: Not Present    ASSESSMENT:      ICD-10 Details  1 GU:   Left renal neoplasm - D49.512    PLAN:           Orders Labs Urine Culture          Schedule Return Visit/Planned Activity: Keep Scheduled Appointment - Schedule Surgery          Document Letter(s):  Created for Patient: Clinical Summary         Notes:   There are no changes in the patients history or physical exam since last evaluation by Dr. Alinda Money. Pt is scheduled to undergo robotic left partial nephrectomy on 05/02/20.   Few  bacteria noted on UA. Although infection not likely, will check culture to be certain prior to surgery.   All pt's questions were answered to the best of my ability.          Next Appointment:      Next Appointment: 04/26/2020 01:00 PM    Appointment Type: Pre OP Appontment    Location: [facility] [phone]    Provider: Mcarthur Rossetti, PA    Reason for Visit: [place] 10/04      * Signed by Mcarthur Rossetti, PA on 04/26/20 at 1:42 PM (EDT)*

## 2020-05-01 MED ORDER — DEXTROSE 5 % IV SOLN
3.0000 g | Freq: Once | INTRAVENOUS | Status: AC
  Administered 2020-05-02: 3 g via INTRAVENOUS
  Filled 2020-05-01: qty 3

## 2020-05-02 ENCOUNTER — Encounter (HOSPITAL_COMMUNITY)
Admission: AD | Disposition: A | Discharge status: Home / Self Care | Payer: Self-pay | Source: Home / Self Care | Attending: Urology

## 2020-05-02 ENCOUNTER — Ambulatory Visit (HOSPITAL_COMMUNITY): Payer: Medicaid Other | Admitting: Anesthesiology

## 2020-05-02 ENCOUNTER — Encounter (HOSPITAL_COMMUNITY): Payer: Self-pay | Admitting: Urology

## 2020-05-02 ENCOUNTER — Ambulatory Visit (HOSPITAL_COMMUNITY): Payer: Medicaid Other | Admitting: Physician Assistant

## 2020-05-02 ENCOUNTER — Other Ambulatory Visit: Payer: Self-pay

## 2020-05-02 ENCOUNTER — Inpatient Hospital Stay (HOSPITAL_COMMUNITY)
Admission: AD | Admit: 2020-05-02 | Discharge: 2020-05-04 | DRG: 657 | Disposition: A | Discharge status: Home / Self Care | Payer: Medicaid Other | Attending: Urology | Admitting: Urology

## 2020-05-02 DIAGNOSIS — F419 Anxiety disorder, unspecified: Secondary | ICD-10-CM | POA: Diagnosis present

## 2020-05-02 DIAGNOSIS — E119 Type 2 diabetes mellitus without complications: Secondary | ICD-10-CM | POA: Diagnosis not present

## 2020-05-02 DIAGNOSIS — J449 Chronic obstructive pulmonary disease, unspecified: Secondary | ICD-10-CM | POA: Diagnosis present

## 2020-05-02 DIAGNOSIS — Z6837 Body mass index (BMI) 37.0-37.9, adult: Secondary | ICD-10-CM

## 2020-05-02 DIAGNOSIS — Z885 Allergy status to narcotic agent status: Secondary | ICD-10-CM

## 2020-05-02 DIAGNOSIS — Z888 Allergy status to other drugs, medicaments and biological substances status: Secondary | ICD-10-CM | POA: Diagnosis not present

## 2020-05-02 DIAGNOSIS — E669 Obesity, unspecified: Secondary | ICD-10-CM | POA: Diagnosis not present

## 2020-05-02 DIAGNOSIS — D49512 Neoplasm of unspecified behavior of left kidney: Principal | ICD-10-CM | POA: Diagnosis present

## 2020-05-02 DIAGNOSIS — I5032 Chronic diastolic (congestive) heart failure: Secondary | ICD-10-CM | POA: Diagnosis not present

## 2020-05-02 DIAGNOSIS — I110 Hypertensive heart disease with heart failure: Secondary | ICD-10-CM | POA: Diagnosis present

## 2020-05-02 DIAGNOSIS — K219 Gastro-esophageal reflux disease without esophagitis: Secondary | ICD-10-CM | POA: Diagnosis not present

## 2020-05-02 DIAGNOSIS — K5790 Diverticulosis of intestine, part unspecified, without perforation or abscess without bleeding: Secondary | ICD-10-CM | POA: Diagnosis not present

## 2020-05-02 DIAGNOSIS — E785 Hyperlipidemia, unspecified: Secondary | ICD-10-CM | POA: Diagnosis not present

## 2020-05-02 DIAGNOSIS — J45909 Unspecified asthma, uncomplicated: Secondary | ICD-10-CM | POA: Diagnosis not present

## 2020-05-02 DIAGNOSIS — M109 Gout, unspecified: Secondary | ICD-10-CM | POA: Diagnosis present

## 2020-05-02 DIAGNOSIS — M1990 Unspecified osteoarthritis, unspecified site: Secondary | ICD-10-CM | POA: Diagnosis present

## 2020-05-02 DIAGNOSIS — I5031 Acute diastolic (congestive) heart failure: Secondary | ICD-10-CM | POA: Diagnosis not present

## 2020-05-02 HISTORY — PX: ROBOTIC ASSITED PARTIAL NEPHRECTOMY: SHX6087

## 2020-05-02 LAB — GLUCOSE, CAPILLARY
Glucose-Capillary: 115 mg/dL — ABNORMAL HIGH (ref 70–99)
Glucose-Capillary: 171 mg/dL — ABNORMAL HIGH (ref 70–99)
Glucose-Capillary: 193 mg/dL — ABNORMAL HIGH (ref 70–99)
Glucose-Capillary: 264 mg/dL — ABNORMAL HIGH (ref 70–99)

## 2020-05-02 LAB — TYPE AND SCREEN
ABO/RH(D): A POS
Antibody Screen: NEGATIVE

## 2020-05-02 LAB — HEMOGLOBIN AND HEMATOCRIT, BLOOD
HCT: 50.1 % (ref 39.0–52.0)
Hemoglobin: 16.4 g/dL (ref 13.0–17.0)

## 2020-05-02 LAB — BASIC METABOLIC PANEL
Anion gap: 14 (ref 5–15)
BUN: 13 mg/dL (ref 6–20)
CO2: 22 mmol/L (ref 22–32)
Calcium: 9.2 mg/dL (ref 8.9–10.3)
Chloride: 99 mmol/L (ref 98–111)
Creatinine, Ser: 1.18 mg/dL (ref 0.61–1.24)
GFR calc Af Amer: >60 mL/min (ref >60)
GFR calc non Af Amer: >60 mL/min (ref >60)
Glucose, Bld: 269 mg/dL — ABNORMAL HIGH (ref 70–99)
Potassium: 5.1 mmol/L (ref 3.5–5.1)
Sodium: 135 mmol/L (ref 135–145)

## 2020-05-02 SURGERY — NEPHRECTOMY, PARTIAL, ROBOT-ASSISTED
Anesthesia: General | Site: Abdomen | Laterality: Left

## 2020-05-02 MED ORDER — PHENYLEPHRINE 40 MCG/ML (10ML) SYRINGE FOR IV PUSH (FOR BLOOD PRESSURE SUPPORT)
PREFILLED_SYRINGE | INTRAVENOUS | Status: AC
  Filled 2020-05-02: qty 10

## 2020-05-02 MED ORDER — SODIUM CHLORIDE (PF) 0.9 % IJ SOLN
INTRAMUSCULAR | Status: DC | PRN
  Administered 2020-05-02: 20 mL

## 2020-05-02 MED ORDER — CALCIUM CARBONATE ANTACID 500 MG PO CHEW
1.0000 | CHEWABLE_TABLET | Freq: Three times a day (TID) | ORAL | Status: DC | PRN
  Administered 2020-05-02: 200 mg via ORAL
  Filled 2020-05-02: qty 1

## 2020-05-02 MED ORDER — HYDROMORPHONE BOLUS VIA INFUSION: 0.5000 mg | INTRAVENOUS | Status: DC | PRN

## 2020-05-02 MED ORDER — GABAPENTIN 300 MG PO CAPS
600.0000 mg | ORAL_CAPSULE | Freq: Three times a day (TID) | ORAL | Status: DC
  Administered 2020-05-02 – 2020-05-04 (×6): 600 mg via ORAL
  Filled 2020-05-02 (×6): qty 2

## 2020-05-02 MED ORDER — FENTANYL CITRATE (PF) 250 MCG/5ML IJ SOLN
INTRAMUSCULAR | Status: AC
  Filled 2020-05-02: qty 5

## 2020-05-02 MED ORDER — ALBUTEROL SULFATE HFA 108 (90 BASE) MCG/ACT IN AERS: 2.0000 | INHALATION_SPRAY | Freq: Four times a day (QID) | RESPIRATORY_TRACT | Status: DC | PRN

## 2020-05-02 MED ORDER — SUGAMMADEX SODIUM 500 MG/5ML IV SOLN
INTRAVENOUS | Status: DC | PRN
  Administered 2020-05-02: 500 mg via INTRAVENOUS

## 2020-05-02 MED ORDER — MEPERIDINE HCL 50 MG/ML IJ SOLN: 6.2500 mg | INTRAMUSCULAR | Status: DC | PRN

## 2020-05-02 MED ORDER — AMLODIPINE BESYLATE 10 MG PO TABS
10.0000 mg | ORAL_TABLET | Freq: Every day | ORAL | Status: DC
  Administered 2020-05-02 – 2020-05-03 (×2): 10 mg via ORAL
  Filled 2020-05-02 (×2): qty 1

## 2020-05-02 MED ORDER — ROCURONIUM BROMIDE 10 MG/ML (PF) SYRINGE
PREFILLED_SYRINGE | INTRAVENOUS | Status: AC
  Filled 2020-05-02: qty 10

## 2020-05-02 MED ORDER — ACETAMINOPHEN 10 MG/ML IV SOLN
1000.0000 mg | Freq: Four times a day (QID) | INTRAVENOUS | Status: AC
  Administered 2020-05-02 – 2020-05-03 (×3): 1000 mg via INTRAVENOUS
  Filled 2020-05-02 (×3): qty 100

## 2020-05-02 MED ORDER — LACTATED RINGERS IR SOLN
Status: DC | PRN
  Administered 2020-05-02: 1000 mL

## 2020-05-02 MED ORDER — LIDOCAINE 2% (20 MG/ML) 5 ML SYRINGE
INTRAMUSCULAR | Status: DC | PRN
  Administered 2020-05-02: 100 mg via INTRAVENOUS
  Administered 2020-05-02: 50 mg via INTRAVENOUS

## 2020-05-02 MED ORDER — MORPHINE SULFATE (PF) 4 MG/ML IV SOLN
INTRAVENOUS | Status: DC
Start: 2020-05-02 — End: 2020-05-02
  Filled 2020-05-02: qty 1

## 2020-05-02 MED ORDER — DIPHENHYDRAMINE HCL 50 MG/ML IJ SOLN: 12.5000 mg | Freq: Four times a day (QID) | INTRAMUSCULAR | Status: DC | PRN

## 2020-05-02 MED ORDER — HYDROMORPHONE HCL 1 MG/ML IJ SOLN
0.5000 mg | INTRAMUSCULAR | Status: DC | PRN
  Administered 2020-05-02 – 2020-05-03 (×7): 1 mg via INTRAVENOUS
  Filled 2020-05-02 (×7): qty 1

## 2020-05-02 MED ORDER — HYDROMORPHONE HCL 1 MG/ML IJ SOLN
INTRAMUSCULAR | Status: AC
  Administered 2020-05-02: 0.5 mg via INTRAVENOUS
  Filled 2020-05-02: qty 1

## 2020-05-02 MED ORDER — DEXMEDETOMIDINE (PRECEDEX) IN NS 20 MCG/5ML (4 MCG/ML) IV SYRINGE
PREFILLED_SYRINGE | INTRAVENOUS | Status: AC
  Filled 2020-05-02: qty 5

## 2020-05-02 MED ORDER — MAGNESIUM CITRATE PO SOLN: 0.5000 | Freq: Once | ORAL | Status: DC

## 2020-05-02 MED ORDER — ROCURONIUM BROMIDE 10 MG/ML (PF) SYRINGE
PREFILLED_SYRINGE | INTRAVENOUS | Status: DC | PRN
  Administered 2020-05-02 (×2): 30 mg via INTRAVENOUS
  Administered 2020-05-02: 10 mg via INTRAVENOUS
  Administered 2020-05-02: 30 mg via INTRAVENOUS
  Administered 2020-05-02: 20 mg via INTRAVENOUS
  Administered 2020-05-02: 100 mg via INTRAVENOUS

## 2020-05-02 MED ORDER — LISINOPRIL 20 MG PO TABS
40.0000 mg | ORAL_TABLET | Freq: Every evening | ORAL | Status: DC
  Administered 2020-05-02 – 2020-05-03 (×2): 40 mg via ORAL
  Filled 2020-05-02 (×2): qty 2

## 2020-05-02 MED ORDER — LACTATED RINGERS IV SOLN: INTRAVENOUS | Status: DC

## 2020-05-02 MED ORDER — CEFAZOLIN SODIUM-DEXTROSE 1-4 GM/50ML-% IV SOLN
1.0000 g | Freq: Three times a day (TID) | INTRAVENOUS | Status: AC
  Administered 2020-05-02 – 2020-05-03 (×2): 1 g via INTRAVENOUS
  Filled 2020-05-02 (×2): qty 50

## 2020-05-02 MED ORDER — ACETAMINOPHEN 10 MG/ML IV SOLN
INTRAVENOUS | Status: AC
  Administered 2020-05-02: 1000 mg
  Filled 2020-05-02: qty 100

## 2020-05-02 MED ORDER — STERILE WATER FOR IRRIGATION IR SOLN
Status: DC | PRN
  Administered 2020-05-02: 1000 mL

## 2020-05-02 MED ORDER — ATORVASTATIN CALCIUM 40 MG PO TABS
40.0000 mg | ORAL_TABLET | Freq: Every evening | ORAL | Status: DC
  Administered 2020-05-02 – 2020-05-03 (×2): 40 mg via ORAL
  Filled 2020-05-02 (×2): qty 1

## 2020-05-02 MED ORDER — HYDROMORPHONE HCL 1 MG/ML IJ SOLN
0.2500 mg | INTRAMUSCULAR | Status: DC | PRN
  Administered 2020-05-02: 0.5 mg via INTRAVENOUS

## 2020-05-02 MED ORDER — SUGAMMADEX SODIUM 500 MG/5ML IV SOLN
INTRAVENOUS | Status: AC
  Filled 2020-05-02: qty 5

## 2020-05-02 MED ORDER — PROMETHAZINE HCL 25 MG/ML IJ SOLN: 6.2500 mg | INTRAMUSCULAR | Status: DC | PRN

## 2020-05-02 MED ORDER — PROPOFOL 10 MG/ML IV BOLUS
INTRAVENOUS | Status: AC
  Filled 2020-05-02: qty 20

## 2020-05-02 MED ORDER — INSULIN ASPART 100 UNIT/ML ~~LOC~~ SOLN
0.0000 [IU] | SUBCUTANEOUS | Status: DC
  Administered 2020-05-02: 11 [IU] via SUBCUTANEOUS
  Administered 2020-05-02: 4 [IU] via SUBCUTANEOUS
  Administered 2020-05-03 (×2): 3 [IU] via SUBCUTANEOUS
  Administered 2020-05-03: 4 [IU] via SUBCUTANEOUS
  Administered 2020-05-03 – 2020-05-04 (×4): 3 [IU] via SUBCUTANEOUS
  Administered 2020-05-04: 4 [IU] via SUBCUTANEOUS

## 2020-05-02 MED ORDER — ONDANSETRON HCL 4 MG/2ML IJ SOLN
INTRAMUSCULAR | Status: AC
  Filled 2020-05-02: qty 2

## 2020-05-02 MED ORDER — KETOROLAC TROMETHAMINE 30 MG/ML IJ SOLN: 30.0000 mg | Freq: Once | INTRAMUSCULAR | Status: AC | PRN

## 2020-05-02 MED ORDER — KETOROLAC TROMETHAMINE 30 MG/ML IJ SOLN
INTRAMUSCULAR | Status: AC
  Administered 2020-05-02: 30 mg via INTRAVENOUS
  Filled 2020-05-02: qty 1

## 2020-05-02 MED ORDER — ALBUTEROL SULFATE (2.5 MG/3ML) 0.083% IN NEBU: 2.5000 mg | INHALATION_SOLUTION | Freq: Four times a day (QID) | RESPIRATORY_TRACT | Status: DC | PRN

## 2020-05-02 MED ORDER — TRAMADOL HCL 50 MG PO TABS: 50.0000 mg | ORAL_TABLET | Freq: Four times a day (QID) | ORAL | 0 refills | Status: DC | PRN

## 2020-05-02 MED ORDER — DEXAMETHASONE SODIUM PHOSPHATE 10 MG/ML IJ SOLN
INTRAMUSCULAR | Status: DC | PRN
  Administered 2020-05-02: 5 mg via INTRAVENOUS

## 2020-05-02 MED ORDER — DEXAMETHASONE SODIUM PHOSPHATE 10 MG/ML IJ SOLN
INTRAMUSCULAR | Status: AC
  Filled 2020-05-02: qty 1

## 2020-05-02 MED ORDER — LIDOCAINE 2% (20 MG/ML) 5 ML SYRINGE
INTRAMUSCULAR | Status: AC
  Filled 2020-05-02: qty 5

## 2020-05-02 MED ORDER — ONDANSETRON HCL 4 MG/2ML IJ SOLN
INTRAMUSCULAR | Status: DC | PRN
  Administered 2020-05-02: 4 mg via INTRAVENOUS

## 2020-05-02 MED ORDER — DIPHENHYDRAMINE HCL 12.5 MG/5ML PO ELIX: 12.5000 mg | ORAL_SOLUTION | Freq: Four times a day (QID) | ORAL | Status: DC | PRN

## 2020-05-02 MED ORDER — DEXMEDETOMIDINE (PRECEDEX) IN NS 20 MCG/5ML (4 MCG/ML) IV SYRINGE
PREFILLED_SYRINGE | INTRAVENOUS | Status: DC | PRN
  Administered 2020-05-02 (×2): 8 ug via INTRAVENOUS
  Administered 2020-05-02: 4 ug via INTRAVENOUS

## 2020-05-02 MED ORDER — PROPOFOL 10 MG/ML IV BOLUS
INTRAVENOUS | Status: AC
  Filled 2020-05-02: qty 40

## 2020-05-02 MED ORDER — ORAL CARE MOUTH RINSE: 15.0000 mL | Freq: Once | OROMUCOSAL | Status: AC

## 2020-05-02 MED ORDER — PHENYLEPHRINE 40 MCG/ML (10ML) SYRINGE FOR IV PUSH (FOR BLOOD PRESSURE SUPPORT)
PREFILLED_SYRINGE | INTRAVENOUS | Status: DC | PRN
  Administered 2020-05-02 (×3): 80 ug via INTRAVENOUS

## 2020-05-02 MED ORDER — ALLOPURINOL 300 MG PO TABS
300.0000 mg | ORAL_TABLET | Freq: Every day | ORAL | Status: DC
  Administered 2020-05-02 – 2020-05-03 (×2): 300 mg via ORAL
  Filled 2020-05-02 (×2): qty 1

## 2020-05-02 MED ORDER — ONDANSETRON HCL 4 MG/2ML IJ SOLN: 4.0000 mg | INTRAMUSCULAR | Status: DC | PRN

## 2020-05-02 MED ORDER — MIDAZOLAM HCL 2 MG/2ML IJ SOLN
INTRAMUSCULAR | Status: AC
  Filled 2020-05-02: qty 2

## 2020-05-02 MED ORDER — SODIUM CHLORIDE 0.45 % IV SOLN: INTRAVENOUS | Status: DC

## 2020-05-02 MED ORDER — DULOXETINE HCL 60 MG PO CPEP
60.0000 mg | ORAL_CAPSULE | Freq: Every day | ORAL | Status: DC
  Administered 2020-05-02 – 2020-05-03 (×2): 60 mg via ORAL
  Filled 2020-05-02 (×2): qty 1

## 2020-05-02 MED ORDER — CYCLOBENZAPRINE HCL 10 MG PO TABS
10.0000 mg | ORAL_TABLET | Freq: Two times a day (BID) | ORAL | Status: DC | PRN
  Filled 2020-05-02: qty 1

## 2020-05-02 MED ORDER — FENTANYL CITRATE (PF) 100 MCG/2ML IJ SOLN
INTRAMUSCULAR | Status: DC | PRN
Start: 2020-05-02 — End: 2020-05-02
  Administered 2020-05-02: 50 ug via INTRAVENOUS
  Administered 2020-05-02: 100 ug via INTRAVENOUS

## 2020-05-02 MED ORDER — MORPHINE SULFATE (PF) 4 MG/ML IV SOLN: 2.0000 mg | INTRAVENOUS | Status: DC | PRN

## 2020-05-02 MED ORDER — METOPROLOL TARTRATE 50 MG PO TABS
50.0000 mg | ORAL_TABLET | Freq: Two times a day (BID) | ORAL | Status: DC
  Administered 2020-05-02 – 2020-05-04 (×4): 50 mg via ORAL
  Filled 2020-05-02 (×4): qty 1

## 2020-05-02 MED ORDER — SODIUM CHLORIDE (PF) 0.9 % IJ SOLN
INTRAMUSCULAR | Status: AC
  Filled 2020-05-02: qty 20

## 2020-05-02 MED ORDER — DOCUSATE SODIUM 100 MG PO CAPS
100.0000 mg | ORAL_CAPSULE | Freq: Two times a day (BID) | ORAL | Status: DC
  Administered 2020-05-02 – 2020-05-04 (×4): 100 mg via ORAL
  Filled 2020-05-02 (×4): qty 1

## 2020-05-02 MED ORDER — MIDAZOLAM HCL 5 MG/5ML IJ SOLN
INTRAMUSCULAR | Status: DC | PRN
  Administered 2020-05-02: 2 mg via INTRAVENOUS

## 2020-05-02 MED ORDER — PROPOFOL 10 MG/ML IV BOLUS
INTRAVENOUS | Status: DC | PRN
  Administered 2020-05-02: 150 mg via INTRAVENOUS
  Administered 2020-05-02: 250 mg via INTRAVENOUS

## 2020-05-02 MED ORDER — BUPIVACAINE LIPOSOME 1.3 % IJ SUSP
20.0000 mL | Freq: Once | INTRAMUSCULAR | Status: AC
  Administered 2020-05-02: 20 mL
  Filled 2020-05-02: qty 20

## 2020-05-02 MED ORDER — CHLORHEXIDINE GLUCONATE 0.12 % MT SOLN
15.0000 mL | Freq: Once | OROMUCOSAL | Status: AC
  Administered 2020-05-02: 15 mL via OROMUCOSAL

## 2020-05-02 SURGICAL SUPPLY — 54 items
APPLICATOR SURGIFLO ENDO (HEMOSTASIS) ×3 IMPLANT
CHLORAPREP W/TINT 26 (MISCELLANEOUS) ×3 IMPLANT
CLIP VESOLOCK LG 6/CT PURPLE (CLIP) ×3 IMPLANT
CLIP VESOLOCK MED LG 6/CT (CLIP) ×6 IMPLANT
COVER SURGICAL LIGHT HANDLE (MISCELLANEOUS) ×3 IMPLANT
COVER TIP SHEARS 8 DVNC (MISCELLANEOUS) ×1 IMPLANT
COVER TIP SHEARS 8MM DA VINCI (MISCELLANEOUS) ×3
COVER WAND RF STERILE (DRAPES) IMPLANT
DECANTER SPIKE VIAL GLASS SM (MISCELLANEOUS) ×3 IMPLANT
DERMABOND ADVANCED (GAUZE/BANDAGES/DRESSINGS) ×2
DERMABOND ADVANCED .7 DNX12 (GAUZE/BANDAGES/DRESSINGS) ×1 IMPLANT
DRAIN CHANNEL 15F RND FF 3/16 (WOUND CARE) ×3 IMPLANT
DRAPE ARM DVNC X/XI (DISPOSABLE) ×4 IMPLANT
DRAPE COLUMN DVNC XI (DISPOSABLE) ×1 IMPLANT
DRAPE DA VINCI XI ARM (DISPOSABLE) ×12
DRAPE DA VINCI XI COLUMN (DISPOSABLE) ×2
DRAPE INCISE IOBAN 66X45 STRL (DRAPES) ×3 IMPLANT
DRAPE SHEET LG 3/4 BI-LAMINATE (DRAPES) ×3 IMPLANT
ELECT PENCIL ROCKER SW 15FT (MISCELLANEOUS) ×3 IMPLANT
ELECT REM PT RETURN 15FT ADLT (MISCELLANEOUS) ×3 IMPLANT
EVACUATOR SILICONE 100CC (DRAIN) ×3 IMPLANT
GLOVE BIO SURGEON STRL SZ 6.5 (GLOVE) ×2 IMPLANT
GLOVE BIO SURGEONS STRL SZ 6.5 (GLOVE) ×1
GLOVE BIOGEL M STRL SZ7.5 (GLOVE) ×6 IMPLANT
GOWN STRL REUS W/TWL LRG LVL3 (GOWN DISPOSABLE) ×9 IMPLANT
HEMOSTAT SURGICEL 4X8 (HEMOSTASIS) IMPLANT
IRRIG SUCT STRYKERFLOW 2 WTIP (MISCELLANEOUS) ×3
IRRIGATION SUCT STRKRFLW 2 WTP (MISCELLANEOUS) ×1 IMPLANT
KIT BASIN OR (CUSTOM PROCEDURE TRAY) ×3 IMPLANT
KIT TURNOVER KIT A (KITS) IMPLANT
PENCIL SMOKE EVACUATOR (MISCELLANEOUS) IMPLANT
POUCH SPECIMEN RETRIEVAL 10MM (ENDOMECHANICALS) ×3 IMPLANT
PROTECTOR NERVE ULNAR (MISCELLANEOUS) ×6 IMPLANT
SEAL CANN UNIV 5-8 DVNC XI (MISCELLANEOUS) ×4 IMPLANT
SEAL XI 5MM-8MM UNIVERSAL (MISCELLANEOUS) ×8
SET TUBE SMOKE EVAC HIGH FLOW (TUBING) ×3 IMPLANT
SOLUTION ELECTROLUBE (MISCELLANEOUS) ×3 IMPLANT
SURGIFLO W/THROMBIN 8M KIT (HEMOSTASIS) ×3 IMPLANT
SUT ETHILON 3 0 PS 1 (SUTURE) ×3 IMPLANT
SUT MNCRL AB 4-0 PS2 18 (SUTURE) ×6 IMPLANT
SUT V-LOC BARB 180 2/0GR6 GS22 (SUTURE) ×3
SUT VIC AB 0 CT1 27 (SUTURE) ×6
SUT VIC AB 0 CT1 27XBRD ANTBC (SUTURE) ×2 IMPLANT
SUT VICRYL 0 UR6 27IN ABS (SUTURE) ×6 IMPLANT
SUT VLOC BARB 180 ABS3/0GR12 (SUTURE) ×6
SUTURE V-LC BRB 180 2/0GR6GS22 (SUTURE) ×1 IMPLANT
SUTURE VLOC BRB 180 ABS3/0GR12 (SUTURE) ×2 IMPLANT
TOWEL OR 17X26 10 PK STRL BLUE (TOWEL DISPOSABLE) ×3 IMPLANT
TOWEL OR NON WOVEN STRL DISP B (DISPOSABLE) ×3 IMPLANT
TRAY FOLEY MTR SLVR 16FR STAT (SET/KITS/TRAYS/PACK) ×3 IMPLANT
TRAY LAPAROSCOPIC (CUSTOM PROCEDURE TRAY) ×3 IMPLANT
TROCAR 12M 150ML BLUNT (TROCAR) ×3 IMPLANT
TROCAR XCEL 12X100 BLDLESS (ENDOMECHANICALS) ×3 IMPLANT
WATER STERILE IRR 1000ML POUR (IV SOLUTION) ×3 IMPLANT

## 2020-05-02 NOTE — Transfer of Care (Signed)
Immediate Anesthesia Transfer of Care Note  Patient: Owen  Procedure(s) Performed: XI ROBOTIC ASSITED LAPAROSCOPIC PARTIAL NEPHRECTOMY (Left Abdomen)  Patient Location: PACU  Anesthesia Type:General  Level of Consciousness: awake, alert , oriented and patient cooperative  Airway & Oxygen Therapy: Patient Spontanous Breathing and Patient connected to face mask oxygen  Post-op Assessment: Report given to RN, Post -op Vital signs reviewed and stable and Patient moving all extremities  Post vital signs: Reviewed and stable  Last Vitals:  Vitals Value Taken Time  BP 138/95 05/02/20 1147  Temp    Pulse 97 05/02/20 1149  Resp 15 05/02/20 1148  SpO2 98 % 05/02/20 1149  Vitals shown include unvalidated device data.  Last Pain:  Vitals:   05/02/20 0555  TempSrc: Oral  PainSc: 0-No pain         Complications: No complications documented.

## 2020-05-02 NOTE — Interval H&P Note (Signed)
History and Physical Interval Note:  05/02/2020 6:59 AM  Preston [name]  has presented today for surgery, with the diagnosis of LEFT RENAL NEOPLASM.  The various methods of treatment have been discussed with the patient and family. After consideration of risks, benefits and other options for treatment, the patient has consented to  Procedure(s): XI ROBOTIC ASSITED LAPAROSCOPIC PARTIAL NEPHRECTOMY (Left) as a surgical intervention.  The patient's history has been reviewed, patient examined, no change in status, stable for surgery.  I have reviewed the patient's chart and labs.  Questions were answered to the patient's satisfaction.     [employer]

## 2020-05-02 NOTE — Discharge Instructions (Signed)

## 2020-05-02 NOTE — Op Note (Signed)
Preoperative diagnosis: Left renal neoplasm  Postoperative diagnosis: Left renal neoplasm  Procedure:  1. Left robotic-assisted laparoscopic partial nephrectomy  Surgeon: Pryor Curia. M.D.  Assistant(s): Debbrah Alar, PA-C  An assistant was required for this surgical procedure.  The duties of the assistant included but were not limited to suctioning, passing suture, camera manipulation, retraction. This procedure would not be able to be performed without an Environmental consultant.  Resident: Dr. Celene Squibb  Anesthesia: General  Complications: None  EBL: 150 mL  IVF:  2000 mL crystalloid  Specimens: 1. Left renal neoplasm  Disposition of specimens: Pathology  Intraoperative findings:       1. Warm renal ischemia time: 22 minutes  Drains: 1. # 15 Blake perinephric drain  Indication:  Preston [name] is a 49 y.o. year old patient with a left renal neoplasm.  After a thorough review of the management options for their renal mass, they elected to proceed with surgical treatment and the above procedure.  We have discussed the potential benefits and risks of the procedure, side effects of the proposed treatment, the likelihood of the patient achieving the goals of the procedure, and any potential problems that might occur during the procedure or recuperation. Informed consent has been obtained.   Description of procedure:  The patient was taken to the operating room and a general anesthetic was administered. The patient was given preoperative antibiotics, placed in the left modified flank position with care to pad all potential pressure points, and prepped and draped in the usual sterile fashion. Next a preoperative timeout was performed.  A site was selected in the upper midline for initial port placement. This was placed using a standard open Hassan technique which allowed entry into the peritoneal cavity under direct vision and without difficulty. A 12 mm port was placed and  a pneumoperitoneum established. The camera was then used to inspect the abdomen and there was no evidence of any intra-abdominal injuries or other abnormalities except for some adhesions between the colon and the abdominal wall at the splenic flexure. The remaining abdominal ports were then placed. 8 mm robotic ports were placed in the left upper quadrant, left lower quadrant, and far left lateral abdominal wall. A 8 mm port was placed to the left of the midline just off the rectus muscle for the camera.. All ports were placed under direct vision without difficulty. The surgical cart was then docked.   Utilizing the cautery scissors, the adhesions were sharply taken down and the white line of Toldt was incised allowing the colon to be mobilized medially and the plane between the mesocolon and the anterior layer of Gerota's fascia to be developed and the kidney to be exposed.  The ureter and gonadal vein were identified inferiorly and the ureter was lifted anteriorly off the psoas muscle.  Dissection proceeded superiorly along the gonadal vein until the renal vein was identified.  The renal hilum was then carefully isolated with a combination of blunt and sharp dissection allowing the renal arterial and venous structures to be separated and isolated in preparation for renal hilar vessel clamping. There was a single renal artery and single renal vein   Attention turned to the kidney and the perinephric fat surrounding the renal mass was removed and the kidney was mobilized sufficiently for exposure and resection of the renal mass. The patient's perinephric fat was densely adherent to his renal capsule making this dissection tedious.  Once the renal mass was properly isolated, preparations were made for  resection of the tumor.  Reconstructive sutures were placed into the abdomen for the renorrhaphy portion of the procedure.  The renal artery was then clamped with bulldog clamps.  The tumor was then excised with  cold scissor dissection along with an adequate visible gross margin of normal renal parenchyma. The tumor appeared to be excised without any gross violation of the tumor. The renal collecting system was entered during removal of the tumor.  Two running 3-0 V-lock suture was then brought through the capsule of the kidney and run along the base of the renal defect to provide hemostasis and close any entry into the renal collecting system if present. Weck clips were used to secure this suture outside the renal capsule at the proximal and distal ends. An additional hemostatic agent (Surgiflo) was then placed into the renal defect. A running 2-0 V lock suture was then used to close the renal capsule using a sliding clip technique which resulted in excellent compression of the renal defect.    The bulldog clamps were then removed from the renal hilar vessel(s). Total warm renal ischemia time was 22 minutes. The renal tumor resection site was examined. Hemostasis appeared adequate.   The kidney was placed back into its normal anatomic position and covered with perinephric fat as needed.  A # 67 Blake drain was then brought through the lateral lower port site and positioned in the perinephric space.  It was secured to the skin with a nylon suture. The surgical cart was undocked.  The renal tumor specimen was removed intact within an endopouch retrieval bag via the upper midline port site.  All other laparoscopic/robotic ports had been removed under direct vision and the pneumoperitoneum let down with inspection of the operative field performed and hemostasis again confirmed. The upper midline incision was then closed at the fascial level with 0-vicryl suture. All incision sites were then injected with local anesthetic and reapproximated at the skin level with 4-0 monocryl subcuticular closures.  Dermabond was applied to the skin.  The patient tolerated the procedure well and without complications.  The patient was able  to be extubated and transferred to the recovery unit in satisfactory condition.  Pryor Curia MD

## 2020-05-02 NOTE — Anesthesia Procedure Notes (Addendum)
Procedure Name: Intubation Date/Time: 05/02/2020 7:29 AM Performed by: Victoriano Lain, CRNA Pre-anesthesia Checklist: Patient identified, Emergency Drugs available, Suction available, Patient being monitored and Timeout performed Patient Re-evaluated:Patient Re-evaluated prior to induction Oxygen Delivery Method: Circle system utilized Preoxygenation: Pre-oxygenation with 100% oxygen Induction Type: IV induction Ventilation: Two handed mask ventilation required and Oral airway inserted - appropriate to patient size Laryngoscope Size: Mac and 4 Grade View: Grade I Tube type: Oral Tube size: 7.5 mm Number of attempts: 1 Airway Equipment and Method: Stylet Placement Confirmation: ETT inserted through vocal cords under direct vision,  positive ETCO2 and breath sounds checked- equal and bilateral Secured at: 25 cm Tube secured with: Tape Dental Injury: Teeth and Oropharynx as per pre-operative assessment  Comments: BURP used, grade I view

## 2020-05-02 NOTE — Anesthesia Postprocedure Evaluation (Signed)
Anesthesia Post Note  Patient: [place]  Procedure(s) Performed: XI ROBOTIC ASSITED LAPAROSCOPIC PARTIAL NEPHRECTOMY (Left Abdomen)     Patient location during evaluation: PACU Anesthesia Type: General Level of consciousness: awake and sedated Pain management: pain level controlled Vital Signs Assessment: post-procedure vital signs reviewed and stable Respiratory status: spontaneous breathing Cardiovascular status: stable Postop Assessment: no apparent nausea or vomiting Anesthetic complications: no   No complications documented.  Last Vitals:  Vitals:   05/02/20 1533 05/02/20 2000  BP: (!) 156/92 (!) 148/95  Pulse: 80 86  Resp: 19 16  Temp: 36.5 C 36.9 C  SpO2: 100% 98%    Last Pain:  Vitals:   05/02/20 2055  TempSrc:   PainSc: [place] Jr

## 2020-05-02 NOTE — Progress Notes (Signed)
Called Dr. Alinda Money about Morphine order for pain. Pt states he experiences vomiting when given Morphine. Dr. Alinda Money stated to administer the Morphine and treat the nausea/vomiting as needed.

## 2020-05-02 NOTE — Progress Notes (Signed)
Post-op note  Subjective: The patient is doing well. Tolerating CLD without nausea/emesis. Pain controlled with PRN analgesics but reports history of stomach upset with morphine; dilaudid has worked in past.   Objective: Vital signs in last 24 hours: Temp:  [97.7 F (36.5 C)-98 F (36.7 C)] 97.7 F (36.5 C) (10/04 1533) Pulse Rate:  [72-98] 80 (10/04 1533) Resp:  [16-32] 19 (10/04 1533) BP: (132-156)/(71-99) 156/92 (10/04 1533) SpO2:  [92 %-100 %] 100 % (10/04 1533) Weight:  [135.2 kg] 135.2 kg (10/04 0555)  Intake/Output from previous day: No intake/output data recorded. Intake/Output this shift: Total I/O In: 2140 [I.V.:2040; Other:50; IV Piggyback:50] Out: 27 [Urine:200; Drains:70; Blood:150]  Physical Exam:  General: Alert and oriented. Abdomen: Soft, Nondistended.  Incisions: Clean and dry. JP drain with sanguinous output   Lab Results: Recent Labs    05/02/20 1224  HGB 16.4  HCT 50.1    Assessment/Plan: POD#0 s/p left robotic partial nephrectomy.   - Switch from PRN morphine to dilaudid for IV breakthrough pain per patient allergy profile. Continue PRN oxycodone - CLD, mIVF - PRN anti-emetics  - Continue 24 hours antibiotics for surgical ppx - IS, SCD - Strict bedrest until morning - Continue foley catheter and JP drain - AM labs  - Hold home ASA 325mg      LOS: 0 days   Celene Squibb 05/02/2020, 3:53 PM

## 2020-05-03 ENCOUNTER — Encounter (HOSPITAL_COMMUNITY): Payer: Self-pay | Admitting: Urology

## 2020-05-03 DIAGNOSIS — Z6837 Body mass index (BMI) 37.0-37.9, adult: Secondary | ICD-10-CM | POA: Diagnosis not present

## 2020-05-03 DIAGNOSIS — K219 Gastro-esophageal reflux disease without esophagitis: Secondary | ICD-10-CM | POA: Diagnosis not present

## 2020-05-03 DIAGNOSIS — E785 Hyperlipidemia, unspecified: Secondary | ICD-10-CM | POA: Diagnosis not present

## 2020-05-03 DIAGNOSIS — E119 Type 2 diabetes mellitus without complications: Secondary | ICD-10-CM | POA: Diagnosis not present

## 2020-05-03 DIAGNOSIS — I5032 Chronic diastolic (congestive) heart failure: Secondary | ICD-10-CM | POA: Diagnosis not present

## 2020-05-03 DIAGNOSIS — K5790 Diverticulosis of intestine, part unspecified, without perforation or abscess without bleeding: Secondary | ICD-10-CM | POA: Diagnosis not present

## 2020-05-03 DIAGNOSIS — M109 Gout, unspecified: Secondary | ICD-10-CM | POA: Diagnosis not present

## 2020-05-03 DIAGNOSIS — D49512 Neoplasm of unspecified behavior of left kidney: Secondary | ICD-10-CM | POA: Diagnosis not present

## 2020-05-03 DIAGNOSIS — I110 Hypertensive heart disease with heart failure: Secondary | ICD-10-CM | POA: Diagnosis not present

## 2020-05-03 DIAGNOSIS — E669 Obesity, unspecified: Secondary | ICD-10-CM | POA: Diagnosis not present

## 2020-05-03 DIAGNOSIS — J449 Chronic obstructive pulmonary disease, unspecified: Secondary | ICD-10-CM | POA: Diagnosis not present

## 2020-05-03 DIAGNOSIS — F419 Anxiety disorder, unspecified: Secondary | ICD-10-CM | POA: Diagnosis not present

## 2020-05-03 DIAGNOSIS — Z885 Allergy status to narcotic agent status: Secondary | ICD-10-CM | POA: Diagnosis not present

## 2020-05-03 DIAGNOSIS — Z888 Allergy status to other drugs, medicaments and biological substances status: Secondary | ICD-10-CM | POA: Diagnosis not present

## 2020-05-03 DIAGNOSIS — M1990 Unspecified osteoarthritis, unspecified site: Secondary | ICD-10-CM | POA: Diagnosis not present

## 2020-05-03 LAB — BASIC METABOLIC PANEL
Anion gap: 13 (ref 5–15)
Anion gap: 10 (ref 5–15)
BUN: 15 mg/dL (ref 6–20)
BUN: 17 mg/dL (ref 6–20)
CO2: 23 mmol/L (ref 22–32)
CO2: 28 mmol/L (ref 22–32)
Calcium: 9.7 mg/dL (ref 8.9–10.3)
Calcium: 9.5 mg/dL (ref 8.9–10.3)
Chloride: 101 mmol/L (ref 98–111)
Chloride: 98 mmol/L (ref 98–111)
Creatinine, Ser: 1.36 mg/dL — ABNORMAL HIGH (ref 0.61–1.24)
Creatinine, Ser: 1.31 mg/dL — ABNORMAL HIGH (ref 0.61–1.24)
GFR calc Af Amer: >60 mL/min (ref >60)
GFR calc non Af Amer: >60 mL/min (ref >60)
GFR calc non Af Amer: >60 mL/min (ref >60)
Glucose, Bld: 116 mg/dL — ABNORMAL HIGH (ref 70–99)
Glucose, Bld: 162 mg/dL — ABNORMAL HIGH (ref 70–99)
Potassium: 4.8 mmol/L (ref 3.5–5.1)
Potassium: 4.1 mmol/L (ref 3.5–5.1)
Sodium: 137 mmol/L (ref 135–145)
Sodium: 136 mmol/L (ref 135–145)

## 2020-05-03 LAB — GLUCOSE, CAPILLARY
Glucose-Capillary: 125 mg/dL — ABNORMAL HIGH (ref 70–99)
Glucose-Capillary: 126 mg/dL — ABNORMAL HIGH (ref 70–99)
Glucose-Capillary: 185 mg/dL — ABNORMAL HIGH (ref 70–99)
Glucose-Capillary: 134 mg/dL — ABNORMAL HIGH (ref 70–99)
Glucose-Capillary: 121 mg/dL — ABNORMAL HIGH (ref 70–99)

## 2020-05-03 LAB — CREATININE, FLUID (PLEURAL, PERITONEAL, JP DRAINAGE): Creat, Fluid: 1.4 mg/dL

## 2020-05-03 LAB — HEMOGLOBIN AND HEMATOCRIT, BLOOD
HCT: 45.2 % (ref 39.0–52.0)
HCT: 44.6 % (ref 39.0–52.0)
Hemoglobin: 14.6 g/dL (ref 13.0–17.0)
Hemoglobin: 14.3 g/dL (ref 13.0–17.0)

## 2020-05-03 MED ORDER — TRAMADOL HCL 50 MG PO TABS
50.0000 mg | ORAL_TABLET | Freq: Four times a day (QID) | ORAL | Status: DC | PRN
  Administered 2020-05-03: 50 mg via ORAL
  Administered 2020-05-03: 100 mg via ORAL
  Administered 2020-05-03 (×2): 50 mg via ORAL
  Administered 2020-05-04: 100 mg via ORAL
  Filled 2020-05-03 (×2): qty 1
  Filled 2020-05-03 (×2): qty 2
  Filled 2020-05-03: qty 1

## 2020-05-03 MED ORDER — BISACODYL 10 MG RE SUPP
10.0000 mg | Freq: Once | RECTAL | Status: AC
  Administered 2020-05-03: 10 mg via RECTAL
  Filled 2020-05-03: qty 1

## 2020-05-03 MED ORDER — CHLORHEXIDINE GLUCONATE CLOTH 2 % EX PADS: 6.0000 | MEDICATED_PAD | Freq: Every day | CUTANEOUS | Status: DC

## 2020-05-03 NOTE — Progress Notes (Signed)
Pt able to ambulate from room and hallway with FWW and SBA, tolerated well and also  pt ambulated with PT. VN

## 2020-05-03 NOTE — Plan of Care (Signed)

## 2020-05-03 NOTE — Progress Notes (Signed)
Attempted to ambulate and stand after bath, pt felt dizzy and unsteady, states he has neuropathy and does not do much walking at home. Will try later to get pt up to chair. Will cont to monitor. VS unchanged. SRP, RN

## 2020-05-03 NOTE — Progress Notes (Signed)
Patient ID: Preston [name], male   DOB: 1971-06-11, 49 y.o.   MRN: [id number]  1 Day Post-Op Subjective: Pt doing well.  Pain controlled.  No nausea or vomiting.  No flatus.  Objective: Vital signs in last 24 hours: Temp:  [97.7 F (36.5 C)-98.5 F (36.9 C)] 97.7 F (36.5 C) (10/05 0431) Pulse Rate:  [75-98] 77 (10/05 0431) Resp:  [16-32] 20 (10/05 0431) BP: (120-156)/(71-99) 136/80 (10/05 0431) SpO2:  [92 %-100 %] 95 % (10/05 0431)  Intake/Output from previous day: 10/04 0701 - 10/05 0700 In: 3639.3 [P.O.:714; I.V.:2675.3; IV Piggyback:200] Out: 1170 [Urine:840; Drains:180; Blood:150] Intake/Output this shift: No intake/output data recorded.  Physical Exam:  General: Alert and oriented CV: RRR Lungs: Clear Abdomen: Soft, ND, Positive BS, drain with minimal SS drainage Incisions: C/D/I with scant bloody drainage from midline incision Ext: NT, No erythema  Lab Results: Recent Labs    05/02/20 1224 05/03/20 0421  HGB 16.4 14.6  HCT 50.1 45.2   BMET Recent Labs    05/02/20 1224 05/03/20 0421  NA 135 137  K 5.1 4.8  CL 99 101  CO2 22 23  GLUCOSE 269* 116*  BUN 13 15  CREATININE 1.18 1.36*  CALCIUM 9.2 9.7     Assessment/Plan: POD # 1 s/p left robotic partial nephrectomy - Ambulate, IS - Advance diet as tolerated - D/C Foley - Monitor renal function - Decrease IVF - Check drain Cr - Path pending - Anticipate d/c late today or tomorrow considering multiple medical comorbidities   LOS: 0 days   Dutch Gray 05/03/2020, 7:50 AM

## 2020-05-03 NOTE — Evaluation (Signed)
Physical Therapy Evaluation Patient Details Name: Preston [name] MRN: [id number] DOB: 1970/09/14 Today's Date: 05/03/2020   History of Present Illness  49 y.o. male with  Past medical history significant for COPD, CHF (EF 22-97%, diastolic dysfunction, followed by Dr. Marlou [name]), anxiety, arthritis, GERD, diabetes, asthma, gout, hyperlipidemia, hypertension, diabetes, obesity. Pt admitted with renal neoplasm, s/p L robotic partial nephrectomy 05/02/20.  Clinical Impression  Just prior to my arrival, pt had ambulated ~200' with RW in the hall with RN, pt reports he wasn't dizzy during ambulation. Pt ambulated again during PT eval in the room 40' holding IV pole, no loss of balance. Pt reports he has a RW at home. Encouraged pt to ambulate at least TID. Pt reports he only walks short distances at baseline due to chronic hip and back pain.  Discussed recommendation for transfer tub bench (to be purchased privately) for showering at home as pt is at risk of falls 2* peripheral neuropathy. No further PT indicated as pt is mobilizing at a modified independent level. Will sign off.     Follow Up Recommendations No PT follow up    Equipment Recommendations  None recommended by PT    Recommendations for Other Services       Precautions / Restrictions Precautions Precautions: None Precaution Comments: pt denies h/o falls in past 1 year Restrictions Weight Bearing Restrictions: No      Mobility  Bed Mobility               General bed mobility comments: up in recliner  Transfers Overall transfer level: Modified independent Equipment used: None             General transfer comment: used arm rests  Ambulation/Gait Ambulation/Gait assistance: Modified independent (Device/Increase time) Gait Distance (Feet): 40 Feet Assistive device: IV Pole Gait Pattern/deviations: WFL(Within Functional Limits) Gait velocity: WFL   General Gait Details: Prior to my arrival, pt had just walked in  the hallway with RN using RW and stated he had no dizziness. Pt ambulated in the room during PT eval with IV pole, no loss of balance, no dizziness.  Stairs            Wheelchair Mobility    Modified Rankin (Stroke Patients Only)       Balance Overall balance assessment: Modified Independent                                           Pertinent Vitals/Pain      Home Living Family/patient expects to be discharged to:: Private residence Living Arrangements: Spouse/significant other Available Help at Discharge: Family;Available 24 hours/day Type of Home: Mobile home Home Access: Stairs to enter Entrance Stairs-Rails: Can reach both;Left;Right Entrance Stairs-Number of Steps: 3 Home Layout: One level Home Equipment: Walker - 2 wheels;Cane - single point      Prior Function Level of Independence: Independent         Comments: walks short distances at baseline, limited by hip and back pain     Hand Dominance        Extremity/Trunk Assessment   Upper Extremity Assessment Upper Extremity Assessment: Overall WFL for tasks assessed    Lower Extremity Assessment Lower Extremity Assessment: RLE deficits/detail;LLE deficits/detail RLE Deficits / Details: B feet numb to light touch at baseline, knee ext +4/5 RLE Sensation: history of peripheral neuropathy LLE Deficits / Details: B feet numb to  light touch at baseline, knee ext +4/5 LLE Sensation: history of peripheral neuropathy    Cervical / Trunk Assessment Cervical / Trunk Assessment: Normal  Communication   Communication: No difficulties  Cognition Arousal/Alertness: Awake/alert Behavior During Therapy: WFL for tasks assessed/performed Overall Cognitive Status: Within Functional Limits for tasks assessed                                        General Comments      Exercises     Assessment/Plan    PT Assessment Patent does not need any further PT services  PT  Problem List         PT Treatment Interventions      PT Goals (Current goals can be found in the Care Plan section)  Acute Rehab PT Goals Patient Stated Goal: just to walk PT Goal Formulation: All assessment and education complete, DC therapy    Frequency     Barriers to discharge        Co-evaluation               AM-PAC PT "6 Clicks" Mobility  Outcome Measure Help needed turning from your back to your side while in a flat bed without using bedrails?: None Help needed moving from lying on your back to sitting on the side of a flat bed without using bedrails?: None Help needed moving to and from a bed to a chair (including a wheelchair)?: None Help needed standing up from a chair using your arms (e.g., wheelchair or bedside chair)?: None Help needed to walk in hospital room?: None Help needed climbing 3-5 steps with a railing? : None 6 Click Score: 24    End of Session   Activity Tolerance: Patient tolerated treatment well Patient left: in chair;with call bell/phone within reach;with family/visitor present Nurse Communication: Mobility status      Time: 5093-2671 PT Time Calculation (min) (ACUTE ONLY): 12 min   Charges:   PT Evaluation $PT Eval Low Complexity: 1 Low         Preston [name] PT 05/03/2020  Acute Rehabilitation Services Pager [phone] Office [phone]

## 2020-05-03 NOTE — Progress Notes (Signed)
Sent JP drainage to lab per order, foley d/c'd per order...600 ml noted. Will monitor for voiding.

## 2020-05-04 LAB — GLUCOSE, CAPILLARY
Glucose-Capillary: 144 mg/dL — ABNORMAL HIGH (ref 70–99)
Glucose-Capillary: 146 mg/dL — ABNORMAL HIGH (ref 70–99)
Glucose-Capillary: 167 mg/dL — ABNORMAL HIGH (ref 70–99)

## 2020-05-04 LAB — BASIC METABOLIC PANEL
Anion gap: 10 (ref 5–15)
BUN: 14 mg/dL (ref 6–20)
CO2: 27 mmol/L (ref 22–32)
Calcium: 9 mg/dL (ref 8.9–10.3)
Chloride: 99 mmol/L (ref 98–111)
Creatinine, Ser: 1.23 mg/dL (ref 0.61–1.24)
GFR calc non Af Amer: >60 mL/min (ref >60)
Glucose, Bld: 150 mg/dL — ABNORMAL HIGH (ref 70–99)
Potassium: 4.2 mmol/L (ref 3.5–5.1)
Sodium: 136 mmol/L (ref 135–145)

## 2020-05-04 LAB — SURGICAL PATHOLOGY

## 2020-05-04 LAB — HEMOGLOBIN AND HEMATOCRIT, BLOOD
HCT: 42.8 % (ref 39.0–52.0)
Hemoglobin: 14.2 g/dL (ref 13.0–17.0)

## 2020-05-04 NOTE — Progress Notes (Signed)
Patient ID: Preston [name], male   DOB: 1970/08/05, 49 y.o.   MRN: [id number]  2 Days Post-Op Subjective: Pt doing well.  Had BM.  Pain controlled.   Objective: Vital signs in last 24 hours: Temp:  [98.2 F (36.8 C)-99.1 F (37.3 C)] 98.4 F (36.9 C) (10/06 0418) Pulse Rate:  [80-101] 89 (10/06 0418) Resp:  [16-18] 18 (10/06 0418) BP: (132-151)/(70-98) 134/95 (10/06 0418) SpO2:  [93 %-97 %] 94 % (10/06 0418)  Intake/Output from previous day: 10/05 0701 - 10/06 0700 In: 2773 [P.O.:1200; I.V.:1473; IV Piggyback:100] Out: 7366 [Urine:3350; Drains:100] Intake/Output this shift: No intake/output data recorded.  Physical Exam:  General: Alert and oriented Abdomen: Soft, ND  Incisions: C/D/I Ext: NT, No erythema  Lab Results: Recent Labs    05/03/20 0421 05/03/20 1329 05/04/20 0543  HGB 14.6 14.3 14.2  HCT 45.2 44.6 42.8   BMET Recent Labs    05/03/20 1329 05/04/20 0543  NA 136 136  K 4.1 4.2  CL 98 99  CO2 28 27  GLUCOSE 162* 150*  BUN 17 14  CREATININE 1.31* 1.23  CALCIUM 9.5 9.0     Assessment/Plan: POD # 2 s/p left RAL partial nephrectomy - D/C drain - D/C home - Path pending   LOS: 1 day   Dutch Gray 05/04/2020, 7:08 AM

## 2020-05-04 NOTE — Plan of Care (Signed)

## 2020-05-04 NOTE — Discharge Summary (Signed)
Date of admission: 05/02/2020  Date of discharge: 05/04/2020  Admission diagnosis: Left renal mass  Discharge diagnosis: Left renal mass  Secondary diagnoses: None  History and Physical: For full details, please see admission history and physical. Briefly, Preston [name] is a 49 y.o. patient with left renal neoplasm.   Hospital Course:  Patient underwent robotic left partial nephrectomy with Dr. Alinda Money on 05/02/20. He tolerated the procedure well and was transferred to the floor after receiving routine post-operative care. His diet was gradually advanced, and his pain was controlled with analgesics. His foley catheter was removed on POD1, and he was able to void spontaneously. His JP drain Cr was negative for leak, and the output remained low during his hospitalization. He had a BM on the evening of POD1. He worked with PT and was cleared for discharge home without PT follow up or additional equipment.   By POD2, he was tolerating a regular diet without nausea/emesis, had a BM, ambulating without issues, voiding spontaneously, and having pain well-controlled with oral analgesics. His JP drain was removed, and he was discharged home.   Resume ASA 360m on POD7.  Laboratory values: Recent Labs    05/03/20 0421 05/03/20 1329 05/04/20 0543  HGB 14.6 14.3 14.2  HCT 45.2 44.6 42.8   Recent Labs    05/03/20 1329 05/04/20 0543  CREATININE 1.31* 1.23    Disposition: Home  Discharge instruction: The patient was instructed to be ambulatory but told to refrain from heavy lifting, strenuous activity, or driving.   Discharge medications:  Allergies as of 05/04/2020      Reactions   Morphine Itching, Nausea And Vomiting   Adhesive [tape] Itching, Rash      Medication List    STOP taking these medications   aspirin 325 MG tablet   ibuprofen 600 MG tablet Commonly known as: ADVIL     TAKE these medications   Accu-Chek Aviva Plus test strip Generic drug: glucose blood Use as  instructed for 3 times daily testing of blood sugar. E11.9   Accu-Chek Aviva Plus w/Device Kit 1 each by Does not apply route 3 (three) times daily.   albuterol 108 (90 Base) MCG/ACT inhaler Commonly known as: VENTOLIN HFA INHALE 2 PUFFS BY MOUTH EVERY 6 HOURS AS NEEDED FOR WHEEZING OR SHORTNESS OF BREATH (COUGH) What changed: See the new instructions.   albuterol (2.5 MG/3ML) 0.083% nebulizer solution Commonly known as: PROVENTIL Take 3 mLs (2.5 mg total) by nebulization every 6 (six) hours as needed for wheezing or shortness of breath. What changed: Another medication with the same name was changed. Make sure you understand how and when to take each.   allopurinol 300 MG tablet Commonly known as: ZYLOPRIM Take 1 tablet (300 mg total) by mouth at bedtime.   amLODipine 10 MG tablet Commonly known as: NORVASC Take 1 tablet (10 mg total) by mouth at bedtime.   atorvastatin 40 MG tablet Commonly known as: LIPITOR Take 1 tablet (40 mg total) by mouth every evening.   calcium carbonate 750 MG chewable tablet Commonly known as: TUMS EX Chew 1 tablet by mouth 3 (three) times daily as needed for heartburn.   Colchicine 0.6 MG Caps Commonly known as: Mitigare Take 0.6 mg by mouth daily as needed. M10.9   cyclobenzaprine 10 MG tablet Commonly known as: FLEXERIL Take 1 tablet (10 mg total) by mouth 2 (two) times daily as needed for muscle spasms.   DULoxetine 60 MG capsule Commonly known as: CYMBALTA Take 1 capsule (  60 mg total) by mouth daily. What changed: when to take this   famotidine 20 MG tablet Commonly known as: PEPCID Take 1 tablet by mouth once daily   gabapentin 300 MG capsule Commonly known as: NEURONTIN Take 2 capsules (600 mg total) by mouth 3 (three) times daily.   hydrOXYzine 50 MG tablet Commonly known as: ATARAX/VISTARIL Take 1 tablet (50 mg total) by mouth 3 (three) times daily as needed. for anxiety   lisinopril 40 MG tablet Commonly known as:  ZESTRIL Take 1 tablet (40 mg total) by mouth daily. What changed: when to take this   metFORMIN 500 MG tablet Commonly known as: GLUCOPHAGE Take 1 tablet (500 mg total) by mouth 2 (two) times daily with a meal.   metoprolol tartrate 50 MG tablet Commonly known as: LOPRESSOR Take 1 tablet (50 mg total) by mouth 2 (two) times daily.   traMADol 50 MG tablet Commonly known as: Ultram Take 1-2 tablets (50-100 mg total) by mouth every 6 (six) hours as needed for moderate pain or severe pain.       Followup:   Follow-up Information    Raynelle Bring, MD On 05/31/2020.   Specialty: Urology Why: at 1:30 Contact information: [address] [place] [address] [phone]

## 2020-05-04 NOTE — Progress Notes (Signed)
Pt discharged to home, instructions reviewed with pt and wife, acknowledged understanding. SRP, RN

## 2020-05-05 ENCOUNTER — Telehealth: Payer: Self-pay

## 2020-05-05 NOTE — Telephone Encounter (Signed)
Transition Care Management Follow-up Telephone Call spoke with significant other / pt asleep DPR ON FILE FOR ALL [facility] FOR JENNIFER PATTERSON (SIGNIFICANT OTHER)  Date of discharge and from where: 05/04/2020, [facility]   How have you been since you were released from the hospital? She said she is a little better Any questions or concerns?  None Items Reviewed:  Did the pt receive and understand the discharge instructions provided?  yes  Medications obtained and verified?  she said she has all medications including the new ones and did not have any questions about her med regime  Any new allergies since your discharge?  none reported   Do you have support at home?  yes, significant other.  No home health or DME ordered   Functional Questionnaire: (I = Independent and D = Dependent) ADLs: independent  Follow up appointments reviewed:   PCP Hospital f/u appt confirmed? Geryl Rankins on 05/18/2020  Specialist Hospital f/u appt confirmed? Scheduled  Are transportation arrangements needed?  no  If their condition worsens, is the pt aware to call PCP or go to the Emergency Dept.? yes  Was the patient provided with contact information for the PCP's office or ED?  she has the phone number for [facility]  Was to pt encouraged to call back with questions or concerns?  yes

## 2020-05-05 NOTE — Telephone Encounter (Signed)
[place]/PCP Dr. Raul Del 05/18/2020  Transition Care Management Follow-up Telephone Call  Date of discharge and from where: [facility]  How have you been since you were released from the hospital? Doing well  Any questions or concerns? No  Items Reviewed:  Did the pt receive and understand the discharge instructions provided? Yes   Medications obtained and verified? Yes   Any new allergies since your discharge? No   Dietary orders reviewed? Yes  Do you have support at home? Yes   Functional Questionnaire: (I = Independent and D = Dependent) ADLs: I  Bathing/Dressing- I  Meal Prep- I  Eating- I  Maintaining continence- I  Transferring/Ambulation- I  Managing Meds- I  Follow up appointments reviewed:   PCP Hospital f/u appt confirmed? Yes  Scheduled Dr. Margarita Rana 05/18/2020  Specialist Hospital f/u appt confirmed? No    Are transportation arrangements needed? No   If their condition worsens, is the pt aware to call PCP or go to the Emergency Dept.? Yes  Was the patient provided with contact information for the PCP's office or ED? Yes  Was to pt encouraged to call back with questions or concerns? Yes

## 2020-05-18 ENCOUNTER — Ambulatory Visit: Payer: Medicaid Other | Attending: Nurse Practitioner | Admitting: Nurse Practitioner

## 2020-05-18 ENCOUNTER — Encounter: Payer: Self-pay | Admitting: Nurse Practitioner

## 2020-05-18 ENCOUNTER — Other Ambulatory Visit: Payer: Self-pay

## 2020-05-18 VITALS — BP 125/79 | HR 71 | Temp 97.7°F | Ht 75.0 in | Wt 291.2 lb

## 2020-05-18 DIAGNOSIS — I10 Essential (primary) hypertension: Secondary | ICD-10-CM | POA: Diagnosis not present

## 2020-05-18 DIAGNOSIS — Z1159 Encounter for screening for other viral diseases: Secondary | ICD-10-CM | POA: Diagnosis not present

## 2020-05-18 DIAGNOSIS — H9312 Tinnitus, left ear: Secondary | ICD-10-CM | POA: Diagnosis not present

## 2020-05-18 DIAGNOSIS — H6123 Impacted cerumen, bilateral: Secondary | ICD-10-CM

## 2020-05-18 DIAGNOSIS — E119 Type 2 diabetes mellitus without complications: Secondary | ICD-10-CM | POA: Diagnosis not present

## 2020-05-18 DIAGNOSIS — H6122 Impacted cerumen, left ear: Secondary | ICD-10-CM

## 2020-05-18 LAB — GLUCOSE, POCT (MANUAL RESULT ENTRY): POC Glucose: 186 mg/dl — AB (ref 70–99)

## 2020-05-18 NOTE — Progress Notes (Signed)
Diagnoses and all orders for this visit:  Type 2 diabetes mellitus treated without insulin (HCC) -     Glucose (CBG) -     metFORMIN (GLUCOPHAGE) 500 MG tablet; Take 2 tablets (1,000 mg total) by mouth 2 (two) times daily with a meal. Continue blood sugar control as discussed in office today, low carbohydrate diet, and regular physical exercise as tolerated, 150 minutes per week (30 min each day, 5 days per week, or 50 min 3 days per week). Keep blood sugar logs with fasting goal of 90-130 mg/dl, post prandial (after you eat) less than 180.  For Hypoglycemia: BS <60 and Hyperglycemia BS >400; contact the clinic ASAP. Annual eye exams and foot exams are recommended.   Essential hypertension Continue all antihypertensives as prescribed.  Remember to bring in your blood pressure log with you for your follow up appointment.  DASH/Mediterranean Diets are healthier choices for HTN.    Impacted cerumen of left ear Hearing loss of both ears due to cerumen impaction -     Ambulatory referral to ENT  Tinnitus of left ear -     Ambulatory referral to ENT   Subjective:   HPI [place] 49 y.o. male presents to office today for follow up.    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  He has no questions at this time regarding his procedure and states " the doctor thinks that he got it all".    Tinnitus: Patient presents with tinnitus. Onset of symptoms was gradual starting a few months ago ago with unchanged course since that time. Patient describes the tinnitus as recurrent located in the left ear. The quality is described as variable pitch that sounds like ringing,  hissing and roaring. The pattern is pulsatile with an intensity that is medium. Associated symptoms include hearing loss,  Previous treatments include unsuccessful cerumen impaction removal by irrigation.  DM TYPE 2 Despite his weight loss his A1c has slightly increased from 6.7-7.4.  He is currently  taking Metformin 500 mg twice daily.  Will increase Metformin to 1000 mg twice daily.  He is already on ACE and statin.  LDL is at goal with atorvastatin 40 mg and Lovaza 1 g twice daily.  Hyperglycemic complications include peripheral neuropathy for which she is taking gabapentin 600 mg 3 times daily. Lab Results  Component Value Date   HGBA1C 7.4 (H) 04/28/2020   Lab Results  Component Value Date   LDLCALC 51 08/18/2019   ESSENTIAL HYPERTENSION Well-controlled.  Weight is down.  He is currently taking Lopressor 50 mg twice daily, lisinopril 40 mg daily and amlodipine 5 mg daily. Denies chest pain, shortness of breath, palpitations, lightheadedness, dizziness, headaches or BLE edema.  BP Readings from Last 3 Encounters:  05/18/20 125/79  05/04/20 (!) 134/95  04/28/20 (!) 139/94   Review of Systems  Constitutional: Negative for fever, malaise/fatigue and weight loss.  HENT: Positive for hearing loss and tinnitus. Negative for ear discharge, ear pain and nosebleeds.   Eyes: Negative.  Negative for blurred vision, double vision and photophobia.  Respiratory: Negative.  Negative for cough, hemoptysis, sputum production, shortness of breath and wheezing.   Cardiovascular: Positive for leg swelling (With prolonged standing.  Recommended compression stockings.). Negative for chest pain, palpitations, orthopnea, claudication and PND.  Gastrointestinal: Negative.  Negative for heartburn, nausea and vomiting.  Musculoskeletal: Negative.  Negative for myalgias.  Neurological: Negative.  Negative for dizziness, focal weakness, seizures and headaches.  Psychiatric/Behavioral: Negative.  Negative for suicidal ideas.    Past Medical History:  Diagnosis Date  . Anxiety   . Asthma    daily inhaler  . Cancer ([place])    pt. reports diagnosed with skin cancer on face/uncertain type but he reports may be basal cell, pending surgery to remove 09/03/18  . CHF (congestive heart failure) ([place])    no  cardiologist  . Chronic cough    states due to COPD  . Chronic lower back pain   . COPD (chronic obstructive pulmonary disease) (HCC)    no home O2  . Dyspnea    with humidity and high pollen count  . Edentulous    upper gum  . GERD (gastroesophageal reflux disease)   . Gout   . History of Clostridium difficile infection 2014  . Hypercholesterolemia   . Hypertension    states under control with med., has been on med. x 4 yr.  . Insulin dependent diabetes mellitus    type 2   . Osteoarthritis    bilat. hip  . Panic attacks   . Shoulder pain, right 04/18/2017    Past Surgical History:  Procedure Laterality Date  . COLON SURGERY     14 inches of colon removed  . COLONOSCOPY WITH PROPOFOL N/A 04/01/2013   Procedure: COLONOSCOPY WITH PROPOFOL;  Surgeon: Arta Silence, MD;  Location: [facility] ENDOSCOPY;  Service: Endoscopy;  Laterality: N/A;  . FLEXIBLE SIGMOIDOSCOPY N/A 02/28/2013   Procedure: FLEXIBLE SIGMOIDOSCOPY;  Surgeon: Winfield Cunas., MD;  Location: [facility] ENDOSCOPY;  Service: Endoscopy;  Laterality: N/A;  . LAPAROSCOPIC APPENDECTOMY  07/05/2009  . LAPAROSCOPIC LOW ANTERIOR RESECTION N/A 07/08/2013   Procedure: LAPAROSCOPIC LOW ANTERIOR RESECTION WITH TAKEDOWN OF SPLENIC FLEXURE.;  Surgeon: Adin Hector, MD;  Location: [place];  Service: General;  Laterality: N/A;  . MANDIBLE SURGERY Left 2010   "dry skin pocket cut out"  . MULTIPLE EXTRACTIONS WITH ALVEOLOPLASTY N/A 04/24/2017   Procedure: SURGICAL REMOVAL OF TEETH NUMBERS `7 20-29 AND 31;  Surgeon: Michael Litter, DMD;  Location: [place];  Service: Oral Surgery;  Laterality: N/A;  . ROBOTIC ASSITED PARTIAL NEPHRECTOMY Left 05/02/2020   Procedure: XI ROBOTIC ASSITED LAPAROSCOPIC PARTIAL NEPHRECTOMY;  Surgeon: Raynelle Bring, MD;  Location: [facility] ORS;  Service: Urology;  Laterality: Left;  . TOTAL HIP ARTHROPLASTY Left 06/25/2016   Procedure: TOTAL HIP ARTHROPLASTY ANTERIOR APPROACH;  Surgeon: Frederik Pear, MD;   Location: [place];  Service: Orthopedics;  Laterality: Left;  . TOTAL HIP ARTHROPLASTY Right 09/10/2016   Procedure: TOTAL HIP ARTHROPLASTY ANTERIOR APPROACH;  Surgeon: Frederik Pear, MD;  Location: [place];  Service: Orthopedics;  Laterality: Right;    Family History  Problem Relation Age of Onset  . Cancer Father        lymphoma  . Heart attack Mother     Social History Reviewed with no changes to be made today.   Outpatient Medications Prior to Visit  Medication Sig Dispense Refill  . albuterol (PROVENTIL) (2.5 MG/3ML) 0.083% nebulizer solution Take 3 mLs (2.5 mg total) by nebulization every 6 (six) hours as needed for wheezing or shortness of breath. 150 mL 1  . albuterol (VENTOLIN HFA) 108 (90 Base) MCG/ACT inhaler INHALE 2 PUFFS BY MOUTH EVERY 6 HOURS AS NEEDED FOR WHEEZING OR SHORTNESS OF BREATH (COUGH) (Patient taking differently: Inhale 2 puffs into the lungs every 6 (six) hours as needed for wheezing or shortness of breath. ) 18 g 2  . allopurinol (ZYLOPRIM) 300 MG tablet  Take 1 tablet (300 mg total) by mouth at bedtime. 90 tablet 0  . amLODipine (NORVASC) 10 MG tablet Take 1 tablet (10 mg total) by mouth at bedtime. 90 tablet 1  . atorvastatin (LIPITOR) 40 MG tablet Take 1 tablet (40 mg total) by mouth every evening. 90 tablet 2  . Blood Glucose Monitoring Suppl (ACCU-CHEK AVIVA PLUS) w/Device KIT 1 each by Does not apply route 3 (three) times daily. 1 kit 0  . calcium carbonate (TUMS EX) 750 MG chewable tablet Chew 1 tablet by mouth 3 (three) times daily as needed for heartburn.    . Colchicine (MITIGARE) 0.6 MG CAPS Take 0.6 mg by mouth daily as needed. M10.9 30 capsule 3  . DULoxetine (CYMBALTA) 60 MG capsule Take 1 capsule (60 mg total) by mouth daily. (Patient taking differently: Take 60 mg by mouth at bedtime. ) 90 capsule 1  . famotidine (PEPCID) 20 MG tablet Take 1 tablet by mouth once daily (Patient taking differently: Take 20 mg by mouth daily. ) 90 tablet 0  . gabapentin  (NEURONTIN) 300 MG capsule Take 2 capsules (600 mg total) by mouth 3 (three) times daily. 180 capsule 6  . glucose blood (ACCU-CHEK AVIVA PLUS) test strip Use as instructed for 3 times daily testing of blood sugar. E11.9 300 each 2  . hydrOXYzine (ATARAX/VISTARIL) 50 MG tablet Take 1 tablet (50 mg total) by mouth 3 (three) times daily as needed. for anxiety 90 tablet 1  . lisinopril (ZESTRIL) 40 MG tablet Take 1 tablet (40 mg total) by mouth daily. (Patient taking differently: Take 40 mg by mouth every evening. ) 90 tablet 1  . metFORMIN (GLUCOPHAGE) 500 MG tablet Take 1 tablet (500 mg total) by mouth 2 (two) times daily with a meal. 180 tablet 1  . metoprolol tartrate (LOPRESSOR) 50 MG tablet Take 1 tablet (50 mg total) by mouth 2 (two) times daily. 180 tablet 1  . cyclobenzaprine (FLEXERIL) 10 MG tablet Take 1 tablet (10 mg total) by mouth 2 (two) times daily as needed for muscle spasms. (Patient not taking: Reported on 05/18/2020) 60 tablet 1  . traMADol (ULTRAM) 50 MG tablet Take 1-2 tablets (50-100 mg total) by mouth every 6 (six) hours as needed for moderate pain or severe pain. 20 tablet 0   No facility-administered medications prior to visit.    Allergies  Allergen Reactions  . Morphine Itching and Nausea And Vomiting  . Adhesive [Tape] Itching and Rash       Objective:    BP 125/79 (BP Location: Left Arm, Patient Position: Sitting, Cuff Size: Large)   Pulse 71   Temp 97.7 F (36.5 C) (Temporal)   Ht 6' 3"  (1.905 m)   Wt 291 lb 3.2 oz (132.1 kg)   SpO2 97%   BMI 36.40 kg/m  Wt Readings from Last 3 Encounters:  05/18/20 291 lb 3.2 oz (132.1 kg)  05/02/20 298 lb (135.2 kg)  04/05/20 298 lb 6.4 oz (135.4 kg)    Physical Exam Vitals and nursing note reviewed.  Constitutional:      Appearance: He is well-developed.  HENT:     Head: Normocephalic and atraumatic.     Right Ear: Decreased hearing noted. There is no impacted cerumen.     Left Ear: Decreased hearing noted.  There is impacted cerumen.  Cardiovascular:     Rate and Rhythm: Normal rate and regular rhythm.     Heart sounds: Normal heart sounds. No murmur heard.  No friction rub.  No gallop.   Pulmonary:     Effort: Pulmonary effort is normal. No tachypnea or respiratory distress.     Breath sounds: Normal breath sounds. No decreased breath sounds, wheezing, rhonchi or rales.  Chest:     Chest wall: No tenderness.  Abdominal:     General: Bowel sounds are normal.     Palpations: Abdomen is soft.  Musculoskeletal:        General: Normal range of motion.     Cervical back: Normal range of motion.  Skin:    General: Skin is warm and dry.  Neurological:     Mental Status: He is alert and oriented to person, place, and time.     Coordination: Coordination normal.  Psychiatric:        Behavior: Behavior normal. Behavior is cooperative.        Thought Content: Thought content normal.        Judgment: Judgment normal.          Patient has been counseled extensively about nutrition and exercise as well as the importance of adherence with medications and regular follow-up. The patient was given clear instructions to go to ER or return to medical center if symptoms don't improve, worsen or new problems develop. The patient verbalized understanding.   Follow-up: Return in about 2 months (around 08/01/2020) for DM .   Gildardo Pounds, FNP-BC [facility] [place], [place]   05/24/2020, 8:28 AM

## 2020-05-24 ENCOUNTER — Encounter: Payer: Self-pay | Admitting: Nurse Practitioner

## 2020-05-24 MED ORDER — METFORMIN HCL 500 MG PO TABS: 1000.0000 mg | ORAL_TABLET | Freq: Two times a day (BID) | ORAL | 1 refills | Status: DC

## 2020-05-30 ENCOUNTER — Other Ambulatory Visit: Payer: Self-pay | Admitting: Nurse Practitioner

## 2020-05-30 DIAGNOSIS — M1A9XX0 Chronic gout, unspecified, without tophus (tophi): Secondary | ICD-10-CM

## 2020-05-31 DIAGNOSIS — C642 Malignant neoplasm of left kidney, except renal pelvis: Secondary | ICD-10-CM | POA: Diagnosis not present

## 2020-07-18 ENCOUNTER — Ambulatory Visit: Payer: Medicaid Other | Attending: Nurse Practitioner | Admitting: Nurse Practitioner

## 2020-07-18 ENCOUNTER — Other Ambulatory Visit: Payer: Self-pay

## 2020-08-02 DIAGNOSIS — H9312 Tinnitus, left ear: Secondary | ICD-10-CM | POA: Insufficient documentation

## 2020-08-02 DIAGNOSIS — H6122 Impacted cerumen, left ear: Secondary | ICD-10-CM | POA: Insufficient documentation

## 2020-08-02 DIAGNOSIS — H9012 Conductive hearing loss, unilateral, left ear, with unrestricted hearing on the contralateral side: Secondary | ICD-10-CM | POA: Insufficient documentation

## 2020-09-05 ENCOUNTER — Other Ambulatory Visit: Payer: Self-pay | Admitting: Nurse Practitioner

## 2020-09-05 DIAGNOSIS — M1A9XX0 Chronic gout, unspecified, without tophus (tophi): Secondary | ICD-10-CM

## 2020-09-11 ENCOUNTER — Other Ambulatory Visit: Payer: Self-pay | Admitting: Nurse Practitioner

## 2020-09-11 ENCOUNTER — Other Ambulatory Visit: Payer: Self-pay | Admitting: Family Medicine

## 2020-09-11 DIAGNOSIS — E782 Mixed hyperlipidemia: Secondary | ICD-10-CM

## 2020-09-11 DIAGNOSIS — I10 Essential (primary) hypertension: Secondary | ICD-10-CM

## 2020-09-11 DIAGNOSIS — E119 Type 2 diabetes mellitus without complications: Secondary | ICD-10-CM

## 2020-09-11 DIAGNOSIS — J42 Unspecified chronic bronchitis: Secondary | ICD-10-CM

## 2020-10-09 ENCOUNTER — Other Ambulatory Visit: Payer: Self-pay | Admitting: Nurse Practitioner

## 2020-10-09 DIAGNOSIS — G894 Chronic pain syndrome: Secondary | ICD-10-CM

## 2020-10-09 DIAGNOSIS — I10 Essential (primary) hypertension: Secondary | ICD-10-CM

## 2020-10-09 NOTE — Telephone Encounter (Signed)
Requested Prescriptions  Pending Prescriptions Disp Refills  . DULoxetine (CYMBALTA) 60 MG capsule [Pharmacy Med Name: DULoxetine HCl 60 MG Oral Capsule Delayed Release Particles] 90 capsule     Sig: Take 1 capsule by mouth once daily     Psychiatry: Antidepressants - SNRI Passed - 10/09/2020  1:25 PM      Passed - Completed PHQ-2 or PHQ-9 in the last 360 days      Passed - Last BP in normal range    BP Readings from Last 1 Encounters:  05/18/20 125/79         Passed - Valid encounter within last 6 months    Recent Outpatient Visits          4 months ago Type 2 diabetes mellitus treated without insulin [facility])   [place], Vernia Buff, NP   7 months ago Hospital discharge follow-up   [place], [place] W, NP   10 months ago Type 2 diabetes mellitus treated without insulin [facility])   [place], [place] W, NP   1 year ago Type 2 diabetes mellitus treated without insulin [facility])   [place], [place] W, NP   1 year ago Chronic back pain greater than 3 months duration   [place], [place] W, NP             . metoprolol tartrate (LOPRESSOR) 50 MG tablet [Pharmacy Med Name: Metoprolol Tartrate 50 MG Oral Tablet] 180 tablet     Sig: Take 1 tablet by mouth twice daily     Cardiovascular:  Beta Blockers Passed - 10/09/2020  1:25 PM      Passed - Last BP in normal range    BP Readings from Last 1 Encounters:  05/18/20 125/79         Passed - Last Heart Rate in normal range    Pulse Readings from Last 1 Encounters:  05/18/20 71         Passed - Valid encounter within last 6 months    Recent Outpatient Visits          4 months ago Type 2 diabetes mellitus treated without insulin [facility])   [place], Vernia Buff, NP   7 months ago Hospital discharge follow-up   [place], [place] W, NP   10 months ago Type 2 diabetes mellitus treated without insulin [facility])   [place], [place] W, NP   1 year ago Type 2 diabetes mellitus treated without insulin [facility])   [place], [place] W, NP   1 year ago Chronic back pain greater than 3 months duration   [place], [place] W, NP             . amLODipine (NORVASC) 10 MG tablet [Pharmacy Med Name: amLODIPine Besylate 10 MG Oral Tablet] 90 tablet     Sig: TAKE 1 TABLET BY MOUTH AT BEDTIME     Cardiovascular:  Calcium Channel Blockers Passed - 10/09/2020  1:25 PM      Passed - Last BP in normal range    BP Readings from Last 1 Encounters:  05/18/20 125/79         Passed - Valid encounter within last 6 months  Recent Outpatient Visits          4 months ago Type 2 diabetes mellitus treated without insulin [facility])   [place] Gildardo Pounds, NP   7 months ago Hospital discharge follow-up   [place], [place] W, NP   10 months ago Type 2 diabetes mellitus treated without insulin [facility])   [place], [place] W, NP   1 year ago Type 2 diabetes mellitus treated without insulin [facility])   [place], [place] W, NP   1 year ago Chronic back pain greater than 3 months duration   [place], Vernia Buff, NP

## 2020-10-09 NOTE — Telephone Encounter (Signed)
Requested medication (s) are due for refill today: _  Requested medication (s) are on the active medication list: no  Last refill:  both Rf's 03/09/20   Future visit scheduled: no  Notes to clinic:  both meds have expired 06/07/20   Requested Prescriptions  Pending Prescriptions Disp Refills   DULoxetine (CYMBALTA) 60 MG capsule [Pharmacy Med Name: DULoxetine HCl 60 MG Oral Capsule Delayed Release Particles] 90 capsule     Sig: Take 1 capsule by mouth once daily      Psychiatry: Antidepressants - SNRI Passed - 10/09/2020  1:25 PM      Passed - Completed PHQ-2 or PHQ-9 in the last 360 days      Passed - Last BP in normal range    BP Readings from Last 1 Encounters:  05/18/20 125/79          Passed - Valid encounter within last 6 months    Recent Outpatient Visits           4 months ago Type 2 diabetes mellitus treated without insulin ([place])   [place], Vernia Buff, NP   7 months ago Hospital discharge follow-up   [place], [place] W, NP   10 months ago Type 2 diabetes mellitus treated without insulin [facility])   [place], [place] W, NP   1 year ago Type 2 diabetes mellitus treated without insulin [facility])   [place], [place] W, NP   1 year ago Chronic back pain greater than 3 months duration   [place], [place] W, NP                  metoprolol tartrate (LOPRESSOR) 50 MG tablet [Pharmacy Med Name: Metoprolol Tartrate 50 MG Oral Tablet] 180 tablet     Sig: Take 1 tablet by mouth twice daily      Cardiovascular:  Beta Blockers Passed - 10/09/2020  1:25 PM      Passed - Last BP in normal range    BP Readings from Last 1 Encounters:  05/18/20 125/79          Passed - Last Heart Rate in normal range    Pulse Readings from Last 1 Encounters:  05/18/20 71          Passed - Valid  encounter within last 6 months    Recent Outpatient Visits           4 months ago Type 2 diabetes mellitus treated without insulin ([place])   [place], Vernia Buff, NP   7 months ago Hospital discharge follow-up   [place], [place] W, NP   10 months ago Type 2 diabetes mellitus treated without insulin [facility])   [place], [place] W, NP   1 year ago Type 2 diabetes mellitus treated without insulin [facility])   [place], [place] W, NP   1 year ago Chronic back pain greater than 3 months duration   [place], NP                 Refused Prescriptions Disp Refills   amLODipine (NORVASC) 10 MG tablet [Pharmacy Med Name: amLODIPine Besylate 10 MG Oral Tablet] 90 tablet     Sig:  TAKE 1 TABLET BY MOUTH AT BEDTIME      Cardiovascular:  Calcium Channel Blockers Passed - 10/09/2020  1:25 PM      Passed - Last BP in normal range    BP Readings from Last 1 Encounters:  05/18/20 125/79          Passed - Valid encounter within last 6 months    Recent Outpatient Visits           4 months ago Type 2 diabetes mellitus treated without insulin [facility])   [place], Vernia Buff, NP   7 months ago Hospital discharge follow-up   [place], [place] W, NP   10 months ago Type 2 diabetes mellitus treated without insulin [facility])   [place], [place] W, NP   1 year ago Type 2 diabetes mellitus treated without insulin [facility])   [place], [place] W, NP   1 year ago Chronic back pain greater than 3 months duration   [place], Vernia Buff, NP

## 2020-11-06 ENCOUNTER — Other Ambulatory Visit: Payer: Self-pay | Admitting: Family Medicine

## 2020-11-06 ENCOUNTER — Other Ambulatory Visit: Payer: Self-pay | Admitting: Nurse Practitioner

## 2020-11-06 DIAGNOSIS — I10 Essential (primary) hypertension: Secondary | ICD-10-CM

## 2020-11-06 DIAGNOSIS — G894 Chronic pain syndrome: Secondary | ICD-10-CM

## 2020-11-06 NOTE — Telephone Encounter (Signed)
Requested Prescriptions  Pending Prescriptions Disp Refills  . DULoxetine (CYMBALTA) 60 MG capsule [Pharmacy Med Name: DULoxetine HCl 60 MG Oral Capsule Delayed Release Particles] 30 capsule 0    Sig: Take 1 capsule by mouth once daily     Psychiatry: Antidepressants - SNRI Passed - 11/06/2020  1:21 PM      Passed - Completed PHQ-2 or PHQ-9 in the last 360 days      Passed - Last BP in normal range    BP Readings from Last 1 Encounters:  05/18/20 125/79         Passed - Valid encounter within last 6 months    Recent Outpatient Visits          5 months ago Type 2 diabetes mellitus treated without insulin [facility])   [place], Vernia Buff, NP   8 months ago Hospital discharge follow-up   [place], [place] W, NP   11 months ago Type 2 diabetes mellitus treated without insulin [facility])   [place], [place] W, NP   1 year ago Type 2 diabetes mellitus treated without insulin [facility])   [place], [place] W, NP   1 year ago Chronic back pain greater than 3 months duration   [place], Vernia Buff, NP

## 2020-11-06 NOTE — Telephone Encounter (Signed)
Pt sent MyChart message that he is overdue appt and needs appt for refill.  Last RF: 10/11/20 # 60

## 2020-11-11 ENCOUNTER — Other Ambulatory Visit: Payer: Self-pay | Admitting: Nurse Practitioner

## 2020-11-11 DIAGNOSIS — I10 Essential (primary) hypertension: Secondary | ICD-10-CM

## 2020-11-14 MED ORDER — METOPROLOL TARTRATE 50 MG PO TABS: 50.0000 mg | ORAL_TABLET | Freq: Two times a day (BID) | ORAL | 0 refills | Status: DC

## 2020-11-25 ENCOUNTER — Other Ambulatory Visit: Payer: Self-pay | Admitting: Nurse Practitioner

## 2020-11-25 DIAGNOSIS — G894 Chronic pain syndrome: Secondary | ICD-10-CM

## 2020-11-25 DIAGNOSIS — E119 Type 2 diabetes mellitus without complications: Secondary | ICD-10-CM

## 2020-11-25 DIAGNOSIS — I10 Essential (primary) hypertension: Secondary | ICD-10-CM

## 2020-11-25 NOTE — Telephone Encounter (Signed)
Requested medication (s) are due for refill today - no  Requested medication (s) are on the active medication list -no  Future visit scheduled -yes  Last refill: unsure  Notes to clinic: Patient requesting medication no longer current on medication list- sent for review  Requested Prescriptions  Pending Prescriptions Disp Refills   DULoxetine (CYMBALTA) 60 MG capsule [Pharmacy Med Name: DULoxetine HCl 60 MG Oral Capsule Delayed Release Particles] 30 capsule 0    Sig: TAKE 1 CAPSULE BY MOUTH ONCE DAILY.MALE APPOINTMENT FOR FUTURE REFILLS      Psychiatry: Antidepressants - SNRI Failed - 11/25/2020  6:55 PM      Failed - Valid encounter within last 6 months    Recent Outpatient Visits           6 months ago Type 2 diabetes mellitus treated without insulin [facility])   [place], Vernia Buff, NP   8 months ago Hospital discharge follow-up   [place], Vernia Buff, NP   1 year ago Type 2 diabetes mellitus treated without insulin ([place])   [place], Vernia Buff, NP   1 year ago Type 2 diabetes mellitus treated without insulin ([place])   [place], Vernia Buff, NP   1 year ago Chronic back pain greater than 3 months duration   [place], Vernia Buff, NP       Future Appointments             In 3 weeks Argentina Donovan, PA-C [place] - Completed PHQ-2 or PHQ-9 in the last 360 days      Passed - Last BP in normal range    BP Readings from Last 1 Encounters:  05/18/20 125/79           Signed Prescriptions Disp Refills   lisinopril (ZESTRIL) 40 MG tablet 90 tablet 0    Sig: Take 1 tablet by mouth once daily      Cardiovascular:  ACE Inhibitors Failed - 11/25/2020  6:55 PM      Failed - Cr in normal range and within 180 days    Creat  Date Value Ref  Range Status  04/26/2016 0.77 0.60 - 1.35 mg/dL Final   Creatinine, Ser  Date Value Ref Range Status  05/04/2020 1.23 0.61 - 1.24 mg/dL Final   Creatinine,U  Date Value Ref Range Status  08/05/2013 138.5 mg/dL Final    Comment:      Cutoff Values for Urine Drug Screen:         Drug Class           Cutoff (ng/mL)         Amphetamines            1000         Barbiturates             200         Cocaine Metabolites      300         Benzodiazepines          200         Methadone                300         Opiates  2000         Phencyclidine             25         Propoxyphene             300         Marijuana Metabolites     50   For medical purposes only.          Failed - K in normal range and within 180 days    Potassium  Date Value Ref Range Status  05/04/2020 4.2 3.5 - 5.1 mmol/L Final          Failed - Valid encounter within last 6 months    Recent Outpatient Visits           6 months ago Type 2 diabetes mellitus treated without insulin [facility])   [place], Vernia Buff, NP   8 months ago Hospital discharge follow-up   [place], [place] W, NP   1 year ago Type 2 diabetes mellitus treated without insulin [facility])   [place], [place] W, NP   1 year ago Type 2 diabetes mellitus treated without insulin [facility])   [place], [place] W, NP   1 year ago Chronic back pain greater than 3 months duration   [place], Vernia Buff, NP       Future Appointments             In 3 weeks Argentina Donovan, PA-C [place] - Patient is not pregnant      Passed - Last BP in normal range    BP Readings from Last 1 Encounters:  05/18/20 125/79            gabapentin (NEURONTIN) 300 MG capsule 180 capsule 0    Sig: TAKE 2  CAPSULES BY MOUTH THREE TIMES DAILY      Neurology: Anticonvulsants - gabapentin Passed - 11/25/2020  6:55 PM      Passed - Valid encounter within last 12 months    Recent Outpatient Visits           6 months ago Type 2 diabetes mellitus treated without insulin [facility])   [place], Vernia Buff, NP   8 months ago Hospital discharge follow-up   [place], [place] W, NP   1 year ago Type 2 diabetes mellitus treated without insulin [facility])   [place], [place] W, NP   1 year ago Type 2 diabetes mellitus treated without insulin [facility])   [place], [place] W, NP   1 year ago Chronic back pain greater than 3 months duration   [place], Vernia Buff, NP       Future Appointments             In 3 weeks [place], Dionne Bucy, PA-C [place]              Refused Prescriptions Disp Refills   metoprolol tartrate (LOPRESSOR) 50 MG tablet [Pharmacy Med Name: Metoprolol Tartrate 50 MG Oral Tablet] 60 tablet 0    Sig: Take 1 tablet  by mouth twice daily      Cardiovascular:  Beta Blockers Failed - 11/25/2020  6:55 PM      Failed - Valid encounter within last 6 months    Recent Outpatient Visits           6 months ago Type 2 diabetes mellitus treated without insulin [facility])   [place], Vernia Buff, NP   8 months ago Hospital discharge follow-up   [place], [place] W, NP   1 year ago Type 2 diabetes mellitus treated without insulin [facility])   [place], [place] W, NP   1 year ago Type 2 diabetes mellitus treated without insulin [facility])   [place], [place] W, NP   1 year ago Chronic back pain greater than 3 months duration   [place], Vernia Buff, NP       Future Appointments             In 3 weeks Argentina Donovan, PA-C [place] - Last BP in normal range    BP Readings from Last 1 Encounters:  05/18/20 125/79          Passed - Last Heart Rate in normal range    Pulse Readings from Last 1 Encounters:  05/18/20 71              Requested Prescriptions  Pending Prescriptions Disp Refills   DULoxetine (CYMBALTA) 60 MG capsule [Pharmacy Med Name: DULoxetine HCl 60 MG Oral Capsule Delayed Release Particles] 30 capsule 0    Sig: TAKE 1 CAPSULE BY MOUTH ONCE DAILY.MALE APPOINTMENT FOR FUTURE REFILLS      Psychiatry: Antidepressants - SNRI Failed - 11/25/2020  6:55 PM      Failed - Valid encounter within last 6 months    Recent Outpatient Visits           6 months ago Type 2 diabetes mellitus treated without insulin [facility])   [place], Vernia Buff, NP   8 months ago Hospital discharge follow-up   [place], [place] W, NP   1 year ago Type 2 diabetes mellitus treated without insulin [facility])   [place], [place] W, NP   1 year ago Type 2 diabetes mellitus treated without insulin [facility])   [place], [place] W, NP   1 year ago Chronic back pain greater than 3 months duration   [place], Vernia Buff, NP       Future Appointments             In 3 weeks Argentina Donovan, PA-C [place] - Completed PHQ-2 or PHQ-9 in the last 360 days      Passed - Last BP in normal range    BP Readings from Last 1 Encounters:  05/18/20 125/79           Signed Prescriptions Disp Refills   lisinopril (ZESTRIL) 40 MG tablet 90 tablet 0    Sig: Take 1 tablet by mouth once daily  Cardiovascular:  ACE Inhibitors Failed - 11/25/2020  6:55 PM      Failed - Cr in normal range and within 180 days    Creat  Date Value Ref Range Status  04/26/2016 0.77 0.60 - 1.35 mg/dL Final   Creatinine, Ser  Date Value Ref Range Status  05/04/2020 1.23 0.61 - 1.24 mg/dL Final   Creatinine,U  Date Value Ref Range Status  08/05/2013 138.5 mg/dL Final    Comment:      Cutoff Values for Urine Drug Screen:         Drug Class           Cutoff (ng/mL)         Amphetamines            1000         Barbiturates             200         Cocaine Metabolites      300         Benzodiazepines          200         Methadone                300         Opiates                 2000         Phencyclidine             25         Propoxyphene             300         Marijuana Metabolites     50   For medical purposes only.          Failed - K in normal range and within 180 days    Potassium  Date Value Ref Range Status  05/04/2020 4.2 3.5 - 5.1 mmol/L Final          Failed - Valid encounter within last 6 months    Recent Outpatient Visits           6 months ago Type 2 diabetes mellitus treated without insulin [facility])   [place], Vernia Buff, NP   8 months ago Hospital discharge follow-up   [place], [place] W, NP   1 year ago Type 2 diabetes mellitus treated without insulin [facility])   [place], [place] W, NP   1 year ago Type 2 diabetes mellitus treated without insulin [facility])   [place], [place] W, NP   1 year ago Chronic back pain greater than 3 months duration   [place], Vernia Buff, NP       Future Appointments             In 3 weeks Argentina Donovan, PA-C [place] - Patient is not pregnant      Passed - Last BP in normal range     BP Readings from Last 1 Encounters:  05/18/20 125/79            gabapentin (NEURONTIN) 300 MG capsule 180 capsule 0    Sig: TAKE 2 CAPSULES BY MOUTH THREE TIMES  DAILY      Neurology: Anticonvulsants - gabapentin Passed - 11/25/2020  6:55 PM      Passed - Valid encounter within last 12 months    Recent Outpatient Visits           6 months ago Type 2 diabetes mellitus treated without insulin [facility])   [place], Vernia Buff, NP   8 months ago Hospital discharge follow-up   [place], Vernia Buff, NP   1 year ago Type 2 diabetes mellitus treated without insulin [facility])   [place], [place] W, NP   1 year ago Type 2 diabetes mellitus treated without insulin [facility])   [place], [place] W, NP   1 year ago Chronic back pain greater than 3 months duration   [place], Vernia Buff, NP       Future Appointments             In 3 weeks Thereasa Solo, Dionne Bucy, [place]              Refused Prescriptions Disp Refills   metoprolol tartrate (LOPRESSOR) 50 MG tablet [Pharmacy Med Name: Metoprolol Tartrate 50 MG Oral Tablet] 60 tablet 0    Sig: Take 1 tablet by mouth twice daily      Cardiovascular:  Beta Blockers Failed - 11/25/2020  6:55 PM      Failed - Valid encounter within last 6 months    Recent Outpatient Visits           6 months ago Type 2 diabetes mellitus treated without insulin [facility])   [place], Vernia Buff, NP   8 months ago Hospital discharge follow-up   [place], [place] W, NP   1 year ago Type 2 diabetes mellitus treated without insulin [facility])   [place], [place] W, NP   1 year ago Type 2 diabetes mellitus treated without insulin [facility])   [place], [place] W, NP   1 year ago Chronic back pain greater than 3 months duration   [place], Vernia Buff, NP       Future Appointments             In 3 weeks Argentina Donovan, PA-C [place] BP in normal range    BP Readings from Last 1 Encounters:  05/18/20 125/79          Passed - Last Heart Rate in normal range    Pulse Readings from Last 1 Encounters:  05/18/20 71

## 2020-12-07 ENCOUNTER — Other Ambulatory Visit: Payer: Self-pay | Admitting: Nurse Practitioner

## 2020-12-07 ENCOUNTER — Other Ambulatory Visit: Payer: Self-pay | Admitting: Family Medicine

## 2020-12-07 DIAGNOSIS — E782 Mixed hyperlipidemia: Secondary | ICD-10-CM

## 2020-12-07 DIAGNOSIS — M1A9XX0 Chronic gout, unspecified, without tophus (tophi): Secondary | ICD-10-CM

## 2020-12-07 DIAGNOSIS — J42 Unspecified chronic bronchitis: Secondary | ICD-10-CM

## 2020-12-07 DIAGNOSIS — I10 Essential (primary) hypertension: Secondary | ICD-10-CM

## 2020-12-07 NOTE — Telephone Encounter (Signed)
Notes to clinic:  Patient has appt on 01/11/2021 Review for 90 day refills    Requested Prescriptions  Pending Prescriptions Disp Refills   metoprolol tartrate (LOPRESSOR) 50 MG tablet [Pharmacy Med Name: Metoprolol Tartrate 50 MG Oral Tablet] 60 tablet 0    Sig: Take 1 tablet by mouth twice daily      Cardiovascular:  Beta Blockers Failed - 12/07/2020  1:45 PM      Failed - Valid encounter within last 6 months    Recent Outpatient Visits           6 months ago Type 2 diabetes mellitus treated without insulin ([place])   [place], Vernia Buff, NP   9 months ago Hospital discharge follow-up   [place], Vernia Buff, NP   1 year ago Type 2 diabetes mellitus treated without insulin ([place])   [place], Vernia Buff, NP   1 year ago Type 2 diabetes mellitus treated without insulin ([place])   [place], [place] W, NP   1 year ago Chronic back pain greater than 3 months duration   [place], Vernia Buff, NP       Future Appointments             In 1 month Gildardo Pounds, NP [place] BP in normal range    BP Readings from Last 1 Encounters:  05/18/20 125/79          Passed - Last Heart Rate in normal range    Pulse Readings from Last 1 Encounters:  05/18/20 71            allopurinol (ZYLOPRIM) 300 MG tablet [Pharmacy Med Name: Allopurinol 300 MG Oral Tablet] 90 tablet 0    Sig: TAKE 1 TABLET BY MOUTH ONCE DAILY AT BEDTIME      Endocrinology:  Gout Agents Failed - 12/07/2020  1:45 PM      Failed - Uric Acid in normal range and within 360 days    Uric Acid  Date Value Ref Range Status  02/17/2019 5.5 3.7 - 8.6 mg/dL Final    Comment:               Therapeutic target for gout patients: <6.0          Passed - Cr in normal range and  within 360 days    Creat  Date Value Ref Range Status  04/26/2016 0.77 0.60 - 1.35 mg/dL Final   Creatinine, Ser  Date Value Ref Range Status  05/04/2020 1.23 0.61 - 1.24 mg/dL Final   Creatinine,U  Date Value Ref Range Status  08/05/2013 138.5 mg/dL Final    Comment:      Cutoff Values for Urine Drug Screen:         Drug Class           Cutoff (ng/mL)         Amphetamines            1000         Barbiturates             200         Cocaine Metabolites      300         Benzodiazepines  200         Methadone                300         Opiates                 2000         Phencyclidine             25         Propoxyphene             300         Marijuana Metabolites     50   For medical purposes only.          Passed - Valid encounter within last 12 months    Recent Outpatient Visits           6 months ago Type 2 diabetes mellitus treated without insulin ([place])   [place], Vernia Buff, NP   9 months ago Hospital discharge follow-up   [place], Vernia Buff, NP   1 year ago Type 2 diabetes mellitus treated without insulin [facility])   [place], Vernia Buff, NP   1 year ago Type 2 diabetes mellitus treated without insulin ([place])   [place], Vernia Buff, NP   1 year ago Chronic back pain greater than 3 months duration   [place], Vernia Buff, NP       Future Appointments             In 1 month Gildardo Pounds, NP [employer]               atorvastatin (LIPITOR) 40 MG tablet [Pharmacy Med Name: Atorvastatin Calcium 40 MG Oral Tablet] 90 tablet 0    Sig: TAKE 1 TABLET BY MOUTH ONCE DAILY IN THE EVENING      Cardiovascular:  Antilipid - Statins Failed - 12/07/2020  1:45 PM      Failed - Total Cholesterol in normal range and within 360 days     Cholesterol, Total  Date Value Ref Range Status  08/18/2019 164 100 - 199 mg/dL Final          Failed - LDL in normal range and within 360 days    LDL Chol Calc (NIH)  Date Value Ref Range Status  08/18/2019 51 0 - 99 mg/dL Final          Failed - HDL in normal range and within 360 days    HDL  Date Value Ref Range Status  08/18/2019 38 (L) >39 mg/dL Final          Failed - Triglycerides in normal range and within 360 days    Triglycerides  Date Value Ref Range Status  08/18/2019 508 (H) 0 - 149 mg/dL Final          Passed - Patient is not pregnant      Passed - Valid encounter within last 12 months    Recent Outpatient Visits           6 months ago Type 2 diabetes mellitus treated without insulin [facility])   [place] Gildardo Pounds, NP   9 months ago Hospital discharge follow-up   [place], Zelda W, NP  1 year ago Type 2 diabetes mellitus treated without insulin [facility])   [place], [place] W, NP   1 year ago Type 2 diabetes mellitus treated without insulin [facility])   [place], [place] W, NP   1 year ago Chronic back pain greater than 3 months duration   [place], Vernia Buff, NP       Future Appointments             In 1 month Gildardo Pounds, NP [place]

## 2020-12-09 ENCOUNTER — Encounter: Payer: Self-pay | Admitting: Nurse Practitioner

## 2020-12-09 ENCOUNTER — Other Ambulatory Visit: Payer: Self-pay | Admitting: Nurse Practitioner

## 2020-12-09 MED ORDER — PROAIR HFA 108 (90 BASE) MCG/ACT IN AERS: 1.0000 | INHALATION_SPRAY | Freq: Four times a day (QID) | RESPIRATORY_TRACT | 0 refills | Status: DC | PRN

## 2020-12-09 MED ORDER — DULOXETINE HCL 60 MG PO CPEP: 60.0000 mg | ORAL_CAPSULE | Freq: Every day | ORAL | 1 refills | Status: DC

## 2020-12-09 MED ORDER — ALLOPURINOL 300 MG PO TABS: 300.0000 mg | ORAL_TABLET | Freq: Every day | ORAL | 0 refills | Status: DC

## 2020-12-09 MED ORDER — ATORVASTATIN CALCIUM 40 MG PO TABS: 1.0000 | ORAL_TABLET | Freq: Every evening | ORAL | 0 refills | Status: DC

## 2020-12-16 ENCOUNTER — Ambulatory Visit (HOSPITAL_COMMUNITY)
Admission: RE | Admit: 2020-12-16 | Discharge: 2020-12-16 | Disposition: A | Discharge status: Home / Self Care | Payer: Medicaid Other | Source: Ambulatory Visit | Attending: Urology | Admitting: Urology

## 2020-12-16 ENCOUNTER — Other Ambulatory Visit: Payer: Self-pay

## 2020-12-16 ENCOUNTER — Other Ambulatory Visit (HOSPITAL_COMMUNITY): Payer: Self-pay | Admitting: Urology

## 2020-12-16 DIAGNOSIS — I10 Essential (primary) hypertension: Secondary | ICD-10-CM | POA: Diagnosis not present

## 2020-12-16 DIAGNOSIS — C642 Malignant neoplasm of left kidney, except renal pelvis: Secondary | ICD-10-CM | POA: Diagnosis not present

## 2020-12-16 DIAGNOSIS — I509 Heart failure, unspecified: Secondary | ICD-10-CM | POA: Diagnosis not present

## 2020-12-21 ENCOUNTER — Other Ambulatory Visit: Payer: Self-pay | Admitting: Nurse Practitioner

## 2020-12-21 DIAGNOSIS — I10 Essential (primary) hypertension: Secondary | ICD-10-CM

## 2020-12-21 NOTE — Telephone Encounter (Signed)
Requested Prescriptions  Pending Prescriptions Disp Refills  . amLODipine (NORVASC) 10 MG tablet [Pharmacy Med Name: amLODIPine Besylate 10 MG Oral Tablet] 90 tablet 0    Sig: TAKE 1 TABLET BY MOUTH AT BEDTIME     Cardiovascular:  Calcium Channel Blockers Failed - 12/21/2020  6:35 PM      Failed - Valid encounter within last 6 months    Recent Outpatient Visits          7 months ago Type 2 diabetes mellitus treated without insulin [facility])   [place], Vernia Buff, NP   9 months ago Hospital discharge follow-up   [place], [place] W, NP   1 year ago Type 2 diabetes mellitus treated without insulin [facility])   [place], [place] W, NP   1 year ago Type 2 diabetes mellitus treated without insulin [facility])   [place], [place] W, NP   1 year ago Chronic back pain greater than 3 months duration   [place], Vernia Buff, NP      Future Appointments            In 3 weeks Gildardo Pounds, NP [place] BP in normal range    BP Readings from Last 1 Encounters:  05/18/20 125/79

## 2020-12-22 ENCOUNTER — Ambulatory Visit: Payer: Medicaid Other | Admitting: Physician Assistant

## 2020-12-23 DIAGNOSIS — C642 Malignant neoplasm of left kidney, except renal pelvis: Secondary | ICD-10-CM | POA: Diagnosis not present

## 2021-01-11 ENCOUNTER — Encounter: Payer: Self-pay | Admitting: Nurse Practitioner

## 2021-01-11 ENCOUNTER — Other Ambulatory Visit: Payer: Self-pay

## 2021-01-11 ENCOUNTER — Ambulatory Visit: Payer: Medicaid Other | Attending: Physician Assistant | Admitting: Nurse Practitioner

## 2021-01-11 VITALS — BP 135/97 | HR 72 | Temp 98.6°F | Resp 20 | Wt 298.0 lb

## 2021-01-11 DIAGNOSIS — L989 Disorder of the skin and subcutaneous tissue, unspecified: Secondary | ICD-10-CM | POA: Diagnosis not present

## 2021-01-11 DIAGNOSIS — Z794 Long term (current) use of insulin: Secondary | ICD-10-CM | POA: Diagnosis not present

## 2021-01-11 DIAGNOSIS — E1142 Type 2 diabetes mellitus with diabetic polyneuropathy: Secondary | ICD-10-CM | POA: Insufficient documentation

## 2021-01-11 DIAGNOSIS — Z760 Encounter for issue of repeat prescription: Secondary | ICD-10-CM | POA: Diagnosis not present

## 2021-01-11 DIAGNOSIS — M1A9XX0 Chronic gout, unspecified, without tophus (tophi): Secondary | ICD-10-CM | POA: Diagnosis not present

## 2021-01-11 DIAGNOSIS — D72829 Elevated white blood cell count, unspecified: Secondary | ICD-10-CM

## 2021-01-11 DIAGNOSIS — Z885 Allergy status to narcotic agent status: Secondary | ICD-10-CM | POA: Insufficient documentation

## 2021-01-11 DIAGNOSIS — Z1159 Encounter for screening for other viral diseases: Secondary | ICD-10-CM

## 2021-01-11 DIAGNOSIS — I509 Heart failure, unspecified: Secondary | ICD-10-CM | POA: Diagnosis not present

## 2021-01-11 DIAGNOSIS — Z79899 Other long term (current) drug therapy: Secondary | ICD-10-CM | POA: Diagnosis not present

## 2021-01-11 DIAGNOSIS — I10 Essential (primary) hypertension: Secondary | ICD-10-CM

## 2021-01-11 DIAGNOSIS — Z8249 Family history of ischemic heart disease and other diseases of the circulatory system: Secondary | ICD-10-CM | POA: Diagnosis not present

## 2021-01-11 DIAGNOSIS — E7800 Pure hypercholesterolemia, unspecified: Secondary | ICD-10-CM | POA: Insufficient documentation

## 2021-01-11 DIAGNOSIS — E785 Hyperlipidemia, unspecified: Secondary | ICD-10-CM | POA: Diagnosis not present

## 2021-01-11 DIAGNOSIS — E1169 Type 2 diabetes mellitus with other specified complication: Secondary | ICD-10-CM | POA: Insufficient documentation

## 2021-01-11 DIAGNOSIS — I110 Hypertensive heart disease with heart failure: Secondary | ICD-10-CM | POA: Diagnosis not present

## 2021-01-11 DIAGNOSIS — J449 Chronic obstructive pulmonary disease, unspecified: Secondary | ICD-10-CM | POA: Insufficient documentation

## 2021-01-11 DIAGNOSIS — Z85528 Personal history of other malignant neoplasm of kidney: Secondary | ICD-10-CM | POA: Diagnosis not present

## 2021-01-11 LAB — POCT GLYCOSYLATED HEMOGLOBIN (HGB A1C): Hemoglobin A1C: 6.6 % — AB (ref 4.0–5.6)

## 2021-01-11 LAB — GLUCOSE, POCT (MANUAL RESULT ENTRY): POC Glucose: 196 mg/dl — AB (ref 70–99)

## 2021-01-11 MED ORDER — DULOXETINE HCL 60 MG PO CPEP: 60.0000 mg | ORAL_CAPSULE | Freq: Every day | ORAL | 0 refills | Status: DC

## 2021-01-11 MED ORDER — ALLOPURINOL 300 MG PO TABS: 300.0000 mg | ORAL_TABLET | Freq: Every day | ORAL | 0 refills | Status: DC

## 2021-01-11 MED ORDER — LISINOPRIL 40 MG PO TABS: 40.0000 mg | ORAL_TABLET | Freq: Every day | ORAL | 0 refills | Status: DC

## 2021-01-11 MED ORDER — METOPROLOL TARTRATE 50 MG PO TABS
50.0000 mg | ORAL_TABLET | Freq: Two times a day (BID) | ORAL | 1 refills | Status: DC
Start: 2021-01-11 — End: 2021-05-15

## 2021-01-11 MED ORDER — GABAPENTIN 300 MG PO CAPS: 3.0000 | ORAL_CAPSULE | Freq: Two times a day (BID) | ORAL | 3 refills | Status: DC

## 2021-01-11 MED ORDER — METFORMIN HCL 500 MG PO TABS: 1000.0000 mg | ORAL_TABLET | Freq: Two times a day (BID) | ORAL | 1 refills | Status: DC

## 2021-01-11 NOTE — Progress Notes (Signed)
Medication refill-  Allupurinol Duloxetine Gabapent Lisinopril Metformin Metoprolol    Pain in feet Rates 4/10

## 2021-01-11 NOTE — Progress Notes (Signed)
Assessment & Plan:  Preston [name] was seen today for diabetes and medication refill.  Diagnoses and all orders for this visit:  Primary hypertension -     CMP14+EGFR -     lisinopril (ZESTRIL) 40 MG tablet; Take 1 tablet (40 mg total) by mouth daily. -     metoprolol tartrate (LOPRESSOR) 50 MG tablet; Take 1 tablet (50 mg total) by mouth 2 (two) times daily. Continue all antihypertensives as prescribed.  Remember to bring in your blood pressure log with you for your follow up appointment.  DASH/Mediterranean Diets are healthier choices for HTN.    Type 2 diabetes mellitus with other specified complication, without long-term current use of insulin ([place]) -     Ambulatory referral to Ophthalmology -     Glucose (CBG) -     HgB A1c -     metFORMIN (GLUCOPHAGE) 500 MG tablet; Take 2 tablets (1,000 mg total) by mouth 2 (two) times daily with a meal. Continue blood sugar control as discussed in office today, low carbohydrate diet, and regular physical exercise as tolerated, 150 minutes per week (30 min each day, 5 days per week, or 50 min 3 days per week). Keep blood sugar logs with fasting goal of 90-130 mg/dl, post prandial (after you eat) less than 180.  For Hypoglycemia: BS <60 and Hyperglycemia BS >400; contact the clinic ASAP. Annual eye exams and foot exams are recommended.    Diabetic polyneuropathy associated with type 2 diabetes mellitus (HCC) -     DULoxetine (CYMBALTA) 60 MG capsule; Take 1 capsule (60 mg total) by mouth daily. -     gabapentin (NEURONTIN) 300 MG capsule; Take 3 capsules (900 mg total) by mouth 2 (two) times daily.  Dyslipidemia, goal LDL below 70 -     Lipid panel INSTRUCTIONS: Work on a low fat, heart healthy diet and participate in regular aerobic exercise program by working out at least 150 minutes per week; 5 days a week-30 minutes per day. Avoid red meat/beef/steak,  fried foods. junk foods, sodas, sugary drinks, unhealthy snacking, alcohol and smoking.  Drink  at least 80 oz of water per day and monitor your carbohydrate intake daily.    Leukocytosis, unspecified type -     CBC with Differential  Need for hepatitis C screening test -     HCV Ab w Reflex to Quant PCR  Skin lesion of left arm -     Ambulatory referral to Dermatology  Chronic gout without tophus, unspecified cause, unspecified site -     allopurinol (ZYLOPRIM) 300 MG tablet; Take 1 tablet (300 mg total) by mouth at bedtime. Symptoms well controlled.   Patient has been counseled on age-appropriate routine health concerns for screening and prevention. These are reviewed and up-to-date. Referrals have been placed accordingly. Immunizations are up-to-date or declined.    Subjective:   Chief Complaint  Patient presents with   Diabetes   Medication Refill   HPI Preston [name] 50 y.o. male presents to office today for follow up up to DM, HTN, HPL He has a past medical history of Anxiety, Asthma, Kidney Cancer, CHF, Chronic cough, Chronic lower back pain, COPD, GERD, Gout, History of Clostridium difficile infection (2014), Hypercholesterolemia, Hypertension, DM2, Osteoarthritis, Panic attacks, and Shoulder pain, right (04/18/2017).   Declines vaccines today.  Skin lesion Noticed last year. Increased in size over the past few months. Associated symptoms: pruritis  DM 2 Well controlled with metfomirn 1000 mg BID. Doing well today. Hypoerglycemic symptoms  include polyneuropathy for which he takes Cymbalta 60 mg daily and gabapentin 900 mg twice daily.  LDL is at goal with atorvastatin 40 mg daily. Blood pressure is not well optimized today.  He endorses adherence taking amlodipine 10 mg daily, metoprolol 50 mg twice daily and lisinopril 40 mg daily. Denies chest pain, shortness of breath, palpitations, lightheadedness, dizziness, headaches or BLE edema.   Lab Results  Component Value Date   HGBA1C 6.6 (A) 01/11/2021   Lab Results  Component Value Date   LDLCALC 51 08/18/2019     BP Readings from Last 3 Encounters:  01/11/21 (!) 135/97  05/18/20 125/79  05/04/20 (!) 134/95     Review of Systems  Constitutional:  Negative for fever, malaise/fatigue and weight loss.  HENT: Negative.  Negative for nosebleeds.   Eyes: Negative.  Negative for blurred vision, double vision and photophobia.  Respiratory: Negative.  Negative for cough and shortness of breath.   Cardiovascular: Negative.  Negative for chest pain, palpitations and leg swelling.  Gastrointestinal: Negative.  Negative for heartburn, nausea and vomiting.  Musculoskeletal: Negative.  Negative for myalgias.  Skin:        SEE HPI  Neurological:  Positive for tingling and sensory change. Negative for dizziness, focal weakness, seizures and headaches.  Psychiatric/Behavioral: Negative.  Negative for suicidal ideas.    Past Medical History:  Diagnosis Date   Anxiety    Asthma    daily inhaler   Cancer ([place])    pt. reports diagnosed with skin cancer on face/uncertain type but he reports may be basal cell, pending surgery to remove 09/03/18   CHF (congestive heart failure) ([place])    no cardiologist   Chronic cough    states due to COPD   Chronic lower back pain    COPD (chronic obstructive pulmonary disease) ([place])    no home O2   Dyspnea    with humidity and high pollen count   Edentulous    upper gum   GERD (gastroesophageal reflux disease)    Gout    History of Clostridium difficile infection 2014   Hypercholesterolemia    Hypertension    states under control with med., has been on med. x 4 yr.   Insulin dependent diabetes mellitus    type 2    Osteoarthritis    bilat. hip   Panic attacks    Shoulder pain, right 04/18/2017    Past Surgical History:  Procedure Laterality Date   COLON SURGERY     14 inches of colon removed   COLONOSCOPY WITH PROPOFOL N/A 04/01/2013   Procedure: COLONOSCOPY WITH PROPOFOL;  Surgeon: Arta Silence, MD;  Location: [facility];  Service: Endoscopy;  Laterality:  N/A;   FLEXIBLE SIGMOIDOSCOPY N/A 02/28/2013   Procedure: FLEXIBLE SIGMOIDOSCOPY;  Surgeon: Winfield Cunas., MD;  Location: [facility] END[facility]COPY;  Service: Endoscopy;  Laterality: N/A;   LAPAROSCOPIC APPENDECTOMY  07/05/2009   LAPAROSCOPIC LOW ANTERIOR RESECTION N/A 07/08/2013   Procedure: LAPAROSCOPIC LOW ANTERIOR RESECTION WITH TAKEDOWN OF SPLENIC FLEXURE.;  Surgeon: Adin Hector, MD;  Location: [facility];  Service: General;  Laterality: N/A;   MANDIBLE SURGERY Left 2010   "dry skin pocket cut out"   MULTIPLE EXTRACTIONS WITH ALVEOLOPLASTY N/A 04/24/2017   Procedure: SURGICAL REMOVAL OF TEETH NUMBERS `7 20-29 AND 31;  Surgeon: Michael Litter, DMD;  Location: [facility];  Service: Oral Surgery;  Laterality: N/A;   ROBOTIC ASSITED PARTIAL NEPHRECTOMY Left 05/02/2020   Procedure: XI ROBOTIC ASSITED LAPAROSCOPIC  PARTIAL NEPHRECTOMY;  Surgeon: Raynelle Bring, MD;  Location: [facility];  Service: Urology;  Laterality: Left;   TOTAL HIP ARTHROPLASTY Left 06/25/2016   Procedure: TOTAL HIP ARTHROPLASTY ANTERIOR APPROACH;  Surgeon: Frederik Pear, MD;  Location: [facility];  Service: Orthopedics;  Laterality: Left;   TOTAL HIP ARTHROPLASTY Right 09/10/2016   Procedure: TOTAL HIP ARTHROPLASTY ANTERIOR APPROACH;  Surgeon: Frederik Pear, MD;  Location: [facility];  Service: Orthopedics;  Laterality: Right;    Family History  Problem Relation Age of Onset   Cancer Father        lymphoma   Heart attack Mother     Social History Reviewed with no changes to be made today.   Outpatient Medications Prior to Visit  Medication Sig Dispense Refill   albuterol (PROVENTIL) (2.5 MG/3ML) 0.083% nebulizer solution Take 3 mLs (2.5 mg total) by nebulization every 6 (six) hours as needed for wheezing or shortness of breath. 150 mL 1   amLODipine (NORVASC) 10 MG tablet TAKE 1 TABLET BY MOUTH AT BEDTIME 90 tablet 0   atorvastatin (LIPITOR) 40 MG tablet TAKE 1 TABLET BY MOUTH ONCE DAILY IN THE EVENING 30 tablet 0   atorvastatin  (LIPITOR) 40 MG tablet Take 1 tablet (40 mg total) by mouth every evening. 90 tablet 0   Blood Glucose Monitoring Suppl (ACCU-CHEK AVIVA PLUS) w/Device KIT 1 each by Does not apply route 3 (three) times daily. 1 kit 0   calcium carbonate (TUMS EX) 750 MG chewable tablet Chew 1 tablet by mouth 3 (three) times daily as needed for heartburn.     Colchicine (MITIGARE) 0.6 MG CAPS Take 0.6 mg by mouth daily as needed. M10.9 30 capsule 3   famotidine (PEPCID) 20 MG tablet Take 1 tablet by mouth once daily (Patient taking differently: Take 20 mg by mouth daily.) 90 tablet 0   glucose blood (ACCU-CHEK AVIVA PLUS) test strip Use as instructed for 3 times daily testing of blood sugar. E11.9 300 each 2   hydrOXYzine (ATARAX/VISTARIL) 50 MG tablet Take 1 tablet (50 mg total) by mouth 3 (three) times daily as needed. for anxiety 90 tablet 1   PROAIR HFA 108 (90 Base) MCG/ACT inhaler Inhale 1-2 puffs into the lungs every 6 (six) hours as needed for wheezing or shortness of breath. 18 g 0   allopurinol (ZYLOPRIM) 300 MG tablet TAKE 1 TABLET BY MOUTH ONCE DAILY AT BEDTIME 30 tablet 0   allopurinol (ZYLOPRIM) 300 MG tablet Take 1 tablet (300 mg total) by mouth at bedtime. 90 tablet 0   DULoxetine (CYMBALTA) 60 MG capsule Take 1 capsule (60 mg total) by mouth daily. 30 capsule 1   gabapentin (NEURONTIN) 300 MG capsule TAKE 2 CAPSULES BY MOUTH THREE TIMES DAILY 180 capsule 0   lisinopril (ZESTRIL) 40 MG tablet Take 1 tablet by mouth once daily 90 tablet 0   metoprolol tartrate (LOPRESSOR) 50 MG tablet Take 1 tablet by mouth twice daily 60 tablet 0   metFORMIN (GLUCOPHAGE) 500 MG tablet Take 2 tablets (1,000 mg total) by mouth 2 (two) times daily with a meal. 360 tablet 1   No facility-administered medications prior to visit.    Allergies  Allergen Reactions   Morphine Itching and Nausea And Vomiting   Adhesive [Tape] Itching and Rash       Objective:    BP (!) 135/97 (BP Location: Left Arm, Patient  Position: Sitting, Cuff Size: Large)   Pulse 72   Temp 98.6 F (37 C)   Resp  20   Wt 298 lb (135.2 kg)   SpO2 98%   BMI 37.25 kg/m  Wt Readings from Last 3 Encounters:  01/11/21 298 lb (135.2 kg)  05/18/20 291 lb 3.2 oz (132.1 kg)  05/02/20 298 lb (135.2 kg)    Physical Exam Vitals and nursing note reviewed.  Constitutional:      Appearance: He is well-developed.  HENT:     Head: Normocephalic and atraumatic.  Cardiovascular:     Rate and Rhythm: Normal rate and regular rhythm.     Heart sounds: Normal heart sounds. No murmur heard.   No friction rub. No gallop.  Pulmonary:     Effort: Pulmonary effort is normal. No tachypnea or respiratory distress.     Breath sounds: Normal breath sounds. No decreased breath sounds, wheezing, rhonchi or rales.  Chest:     Chest wall: No tenderness.  Abdominal:     General: Bowel sounds are normal.     Palpations: Abdomen is soft.  Musculoskeletal:        General: Normal range of motion.     Cervical back: Normal range of motion.  Skin:    General: Skin is warm and dry.     Comments: Keratotic appearing papular skin lesion on left anterior lower forearm. Hyperpigmentation is present as well. There is also a macular hyperpigmented lesion adjacent to aforementioned lesion.   Neurological:     Mental Status: He is alert and oriented to person, place, and time.     Coordination: Coordination normal.  Psychiatric:        Behavior: Behavior normal. Behavior is cooperative.        Thought Content: Thought content normal.        Judgment: Judgment normal.         Patient has been counseled extensively about nutrition and exercise as well as the importance of adherence with medications and regular follow-up. The patient was given clear instructions to go to ER or return to medical center if symptoms don't improve, worsen or new problems develop. The patient verbalized understanding.   Follow-up: Return in about 3 months (around  04/13/2021).   Gildardo Pounds, FNP-BC [facility] [place], [place]   01/11/2021, 12:28 PM

## 2021-01-12 LAB — CMP14+EGFR
ALT: 58 IU/L — ABNORMAL HIGH (ref 0–44)
AST: 28 IU/L (ref 0–40)
Albumin/Globulin Ratio: 2.1 (ref 1.2–2.2)
Albumin: 5.2 g/dL — ABNORMAL HIGH (ref 4.0–5.0)
Alkaline Phosphatase: 74 IU/L (ref 44–121)
BUN/Creatinine Ratio: 7 — ABNORMAL LOW (ref 9–20)
BUN: 5 mg/dL — ABNORMAL LOW (ref 6–24)
Bilirubin Total: 0.3 mg/dL (ref 0.0–1.2)
CO2: 22 mmol/L (ref 20–29)
Calcium: 10 mg/dL (ref 8.7–10.2)
Chloride: 94 mmol/L — ABNORMAL LOW (ref 96–106)
Creatinine, Ser: 0.75 mg/dL — ABNORMAL LOW (ref 0.76–1.27)
Globulin, Total: 2.5 g/dL (ref 1.5–4.5)
Glucose: 131 mg/dL — ABNORMAL HIGH (ref 65–99)
Potassium: 5.5 mmol/L — ABNORMAL HIGH (ref 3.5–5.2)
Sodium: 135 mmol/L (ref 134–144)
Total Protein: 7.7 g/dL (ref 6.0–8.5)
eGFR: 111 mL/min/{1.73_m2} (ref >59)

## 2021-01-12 LAB — LIPID PANEL
Chol/HDL Ratio: 3.3 ratio (ref 0.0–5.0)
Cholesterol, Total: 141 mg/dL (ref 100–199)
HDL: 43 mg/dL (ref >39)
LDL Chol Calc (NIH): 66 mg/dL (ref 0–99)
Triglycerides: 191 mg/dL — ABNORMAL HIGH (ref 0–149)
VLDL Cholesterol Cal: 32 mg/dL (ref 5–40)

## 2021-01-12 LAB — CBC WITH DIFFERENTIAL/PLATELET
Basophils Absolute: 0.1 10*3/uL (ref 0.0–0.2)
Basos: 1 %
EOS (ABSOLUTE): 0.3 10*3/uL (ref 0.0–0.4)
Eos: 3 %
Hematocrit: 46.8 % (ref 37.5–51.0)
Hemoglobin: 15.9 g/dL (ref 13.0–17.7)
Immature Grans (Abs): 0.1 10*3/uL (ref 0.0–0.1)
Immature Granulocytes: 1 %
Lymphocytes Absolute: 2 10*3/uL (ref 0.7–3.1)
Lymphs: 18 %
MCH: 31.1 pg (ref 26.6–33.0)
MCHC: 34 g/dL (ref 31.5–35.7)
MCV: 91 fL (ref 79–97)
Monocytes Absolute: 0.8 10*3/uL (ref 0.1–0.9)
Monocytes: 7 %
Neutrophils Absolute: 7.8 10*3/uL — ABNORMAL HIGH (ref 1.4–7.0)
Neutrophils: 70 %
Platelets: 291 10*3/uL (ref 150–450)
RBC: 5.12 x10E6/uL (ref 4.14–5.80)
RDW: 12.8 % (ref 11.6–15.4)
WBC: 11.1 10*3/uL — ABNORMAL HIGH (ref 3.4–10.8)

## 2021-01-12 LAB — HCV INTERPRETATION

## 2021-01-12 LAB — HCV AB W REFLEX TO QUANT PCR: HCV Ab: <0.1 s/co ratio (ref 0.0–0.9)

## 2021-01-16 ENCOUNTER — Other Ambulatory Visit: Payer: Self-pay | Admitting: Nurse Practitioner

## 2021-01-16 DIAGNOSIS — E875 Hyperkalemia: Secondary | ICD-10-CM

## 2021-01-25 ENCOUNTER — Other Ambulatory Visit: Payer: Self-pay

## 2021-01-25 ENCOUNTER — Ambulatory Visit: Payer: Medicaid Other | Attending: Nurse Practitioner

## 2021-01-25 ENCOUNTER — Telehealth: Payer: Self-pay | Admitting: Nurse Practitioner

## 2021-01-25 DIAGNOSIS — E875 Hyperkalemia: Secondary | ICD-10-CM

## 2021-01-25 NOTE — Telephone Encounter (Signed)
PT dropped off Disability Placard form to be filled out. Pt informed we'll contact him when it is ready for pick up. Please advise and thank you

## 2021-01-26 LAB — POTASSIUM: Potassium: 5.3 mmol/L — ABNORMAL HIGH (ref 3.5–5.2)

## 2021-01-26 NOTE — Telephone Encounter (Signed)
FYI

## 2021-01-29 NOTE — Telephone Encounter (Signed)
Please set him up for 810 or 130 tele. I need to know what his disability is. Thanks!

## 2021-01-31 NOTE — Telephone Encounter (Signed)
Called patient no answer. Left vm for patient to call [phone] to get scheduled.

## 2021-01-31 NOTE — Telephone Encounter (Signed)
Please place patient in 810 or 130 slot

## 2021-03-06 ENCOUNTER — Other Ambulatory Visit: Payer: Self-pay | Admitting: Nurse Practitioner

## 2021-03-06 DIAGNOSIS — J42 Unspecified chronic bronchitis: Secondary | ICD-10-CM

## 2021-03-06 DIAGNOSIS — E1169 Type 2 diabetes mellitus with other specified complication: Secondary | ICD-10-CM

## 2021-03-06 NOTE — Telephone Encounter (Signed)
Requested medication (s) are due for refill today:no  Requested medication (s) are on the active medication list:yes   Last refill:  01/19/2021  Future visit scheduled: yes   Notes to clinic:  Patient should have enough medication until appt on 04/21/2021   Requested Prescriptions  Pending Prescriptions Disp Refills   metFORMIN (GLUCOPHAGE) 500 MG tablet [Pharmacy Med Name: metFORMIN HCl 500 MG Oral Tablet] 180 tablet 0    Sig: TAKE 1 TABLET BY MOUTH TWICE DAILY WITH  A  MEAL      Endocrinology:  Diabetes - Biguanides Failed - 03/06/2021  9:31 AM      Failed - Cr in normal range and within 360 days    Creat  Date Value Ref Range Status  04/26/2016 0.77 0.60 - 1.35 mg/dL Final   Creatinine, Ser  Date Value Ref Range Status  01/11/2021 0.75 (L) 0.76 - 1.27 mg/dL Final   Creatinine,U  Date Value Ref Range Status  08/05/2013 138.5 mg/dL Final    Comment:      Cutoff Values for Urine Drug Screen:         Drug Class           Cutoff (ng/mL)         Amphetamines            1000         Barbiturates             200         Cocaine Metabolites      300         Benzodiazepines          200         Methadone                300         Opiates                 2000         Phencyclidine             25         Propoxyphene             300         Marijuana Metabolites     50   For medical purposes only.          Passed - HBA1C is between 0 and 7.9 and within 180 days    Hemoglobin A1C  Date Value Ref Range Status  01/11/2021 6.6 (A) 4.0 - 5.6 % Final   Hgb A1c MFr Bld  Date Value Ref Range Status  04/28/2020 7.4 (H) 4.8 - 5.6 % Final    Comment:    (NOTE) Pre diabetes:          5.7%-6.4%  Diabetes:              >6.4%  Glycemic control for   <7.0% adults with diabetes           Passed - eGFR in normal range and within 360 days    GFR, Est African American  Date Value Ref Range Status  04/26/2016 >89 >=60 mL/min Final   GFR calc Af Amer  Date Value Ref Range  Status  05/03/2020 >60 >60 mL/min Final   GFR, Est Non African American  Date Value Ref Range Status  04/26/2016 >89 >=60 mL/min Final   GFR calc non Af Wyvonnia Lora  Date Value Ref Range Status  05/04/2020 >60 >60 mL/min Final   eGFR  Date Value Ref Range Status  01/11/2021 111 >59 mL/min/1.73 Final          Passed - Valid encounter within last 6 months    Recent Outpatient Visits           1 month ago Primary hypertension   [place], [place] W, NP   9 months ago Type 2 diabetes mellitus treated without insulin [facility])   [place], Zelda W, NP   12 months ago Hospital discharge follow-up   [place] Gildardo Pounds, NP   1 year ago Type 2 diabetes mellitus treated without insulin [facility])   [place], [place] W, NP   1 year ago Type 2 diabetes mellitus treated without insulin [facility])   [place], Zelda W, NP       Future Appointments             In 1 month Gildardo Pounds, NP [place]   In 2 months [place], Thana Farr, MD [place], [facility]               PROAIR HFA 108 [phone] Base) MCG/ACT inhaler [Pharmacy Med Name: ProAir HFA 108 [phone] Base) MCG/ACT Inhalation Aerosol Solution] 18 g 0    Sig: INHALE 1 TO 2 PUFFS BY MOUTH EVERY 6 HOURS AS NEEDED FOR WHEEZING FOR SHORTNESS OF BREATH      Pulmonology:  Beta Agonists Failed - 03/06/2021  9:31 AM      Failed - One inhaler should last at least one month. If the patient is requesting refills earlier, contact the patient to check for uncontrolled symptoms.      Passed - Valid encounter within last 12 months    Recent Outpatient Visits           1 month ago Primary hypertension   [place], [place] W, NP   9 months ago Type 2 diabetes mellitus treated  without insulin [facility])   [place], Zelda W, NP   12 months ago Hospital discharge follow-up   [place] Gildardo Pounds, NP   1 year ago Type 2 diabetes mellitus treated without insulin [facility])   [place], Vernia Buff, NP   1 year ago Type 2 diabetes mellitus treated without insulin [facility])   [place] Gildardo Pounds, NP       Future Appointments             In 1 month Gildardo Pounds, NP [place]   In 2 months Marlou Porch, Thana Farr, MD [place], [facility]

## 2021-03-21 ENCOUNTER — Telehealth: Payer: Self-pay | Admitting: Nurse Practitioner

## 2021-03-21 ENCOUNTER — Other Ambulatory Visit: Payer: Self-pay | Admitting: Nurse Practitioner

## 2021-03-21 DIAGNOSIS — E1169 Type 2 diabetes mellitus with other specified complication: Secondary | ICD-10-CM

## 2021-03-21 MED ORDER — METFORMIN HCL 500 MG PO TABS: 1000.0000 mg | ORAL_TABLET | Freq: Two times a day (BID) | ORAL | 1 refills | Status: DC

## 2021-03-21 NOTE — Telephone Encounter (Signed)
Patient's wife called to get clarity on the refill request. I advised the patient's profile indicates Metformin 500 mg 1 tab BID. She says the paperwork from the last office visit on 01/11/21 states to take 2 tabs BID, so that's what he has been following and now is out of the Metformin for 2 weeks. She says the insurance will not pay for another refill because it's too soon and the instructions on the bottle is saying to take 1 tablet BID. She says no one ever notified them to follow the bottle instructions and not the paperwork instruction given at the [facility]. I advised I will be sending this to the provider for clarification. She asks if someone will let her know what needs to be done about the Metformin as soon as possible so that he can start back taking it.

## 2021-03-21 NOTE — Telephone Encounter (Signed)
NEW Script has been sent

## 2021-03-21 NOTE — Telephone Encounter (Signed)
Pt's wife called reporting that the pharmacy needs PCP to rewrite prescription with instructions for two pills twice a day in order for his insurance to cover the medication. He has not had his Rx for two weeks. Please advise   metFORMIN (GLUCOPHAGE) 500 MG tablet   [place] - [address]  [address] [place] [address]  Phone: [phone] Fax: [phone]

## 2021-03-21 NOTE — Telephone Encounter (Signed)
Instructions are for metformin 1000 mg BID or 2 tablets twice a day. Unfortunately it was filled as 1 tablet twice a day on the 9th as that is the way the prescription request was sent through for refill from the pharmacy to the pharmacist.

## 2021-03-27 NOTE — Telephone Encounter (Signed)
Called pt made aware of RX sent to pharmacy 

## 2021-04-19 ENCOUNTER — Ambulatory Visit: Payer: Medicaid Other | Admitting: Nurse Practitioner

## 2021-04-25 ENCOUNTER — Other Ambulatory Visit: Payer: Self-pay | Admitting: Nurse Practitioner

## 2021-04-25 DIAGNOSIS — E1142 Type 2 diabetes mellitus with diabetic polyneuropathy: Secondary | ICD-10-CM

## 2021-04-25 DIAGNOSIS — E782 Mixed hyperlipidemia: Secondary | ICD-10-CM

## 2021-05-15 ENCOUNTER — Other Ambulatory Visit: Payer: Self-pay

## 2021-05-15 ENCOUNTER — Ambulatory Visit: Payer: Medicaid Other | Attending: Nurse Practitioner | Admitting: Nurse Practitioner

## 2021-05-15 ENCOUNTER — Encounter: Payer: Self-pay | Admitting: Nurse Practitioner

## 2021-05-15 ENCOUNTER — Encounter: Payer: Self-pay | Admitting: Cardiology

## 2021-05-15 ENCOUNTER — Ambulatory Visit (INDEPENDENT_AMBULATORY_CARE_PROVIDER_SITE_OTHER): Payer: Medicaid Other | Admitting: Cardiology

## 2021-05-15 VITALS — BP 141/94 | HR 84 | Ht 74.0 in | Wt 301.0 lb

## 2021-05-15 VITALS — BP 134/70 | HR 95 | Ht 75.0 in | Wt 302.0 lb

## 2021-05-15 DIAGNOSIS — E1142 Type 2 diabetes mellitus with diabetic polyneuropathy: Secondary | ICD-10-CM

## 2021-05-15 DIAGNOSIS — L989 Disorder of the skin and subcutaneous tissue, unspecified: Secondary | ICD-10-CM

## 2021-05-15 DIAGNOSIS — M1A9XX0 Chronic gout, unspecified, without tophus (tophi): Secondary | ICD-10-CM

## 2021-05-15 DIAGNOSIS — R55 Syncope and collapse: Secondary | ICD-10-CM | POA: Diagnosis not present

## 2021-05-15 DIAGNOSIS — I10 Essential (primary) hypertension: Secondary | ICD-10-CM

## 2021-05-15 DIAGNOSIS — E782 Mixed hyperlipidemia: Secondary | ICD-10-CM | POA: Diagnosis not present

## 2021-05-15 DIAGNOSIS — E7800 Pure hypercholesterolemia, unspecified: Secondary | ICD-10-CM | POA: Diagnosis not present

## 2021-05-15 DIAGNOSIS — E1169 Type 2 diabetes mellitus with other specified complication: Secondary | ICD-10-CM

## 2021-05-15 DIAGNOSIS — D49512 Neoplasm of unspecified behavior of left kidney: Secondary | ICD-10-CM | POA: Diagnosis not present

## 2021-05-15 DIAGNOSIS — J42 Unspecified chronic bronchitis: Secondary | ICD-10-CM | POA: Diagnosis not present

## 2021-05-15 MED ORDER — LISINOPRIL 40 MG PO TABS: 40.0000 mg | ORAL_TABLET | Freq: Every day | ORAL | 0 refills | Status: DC

## 2021-05-15 MED ORDER — AMLODIPINE BESYLATE 10 MG PO TABS: 10.0000 mg | ORAL_TABLET | Freq: Every day | ORAL | 1 refills | Status: DC

## 2021-05-15 MED ORDER — ALLOPURINOL 300 MG PO TABS: 300.0000 mg | ORAL_TABLET | Freq: Every day | ORAL | 0 refills | Status: DC

## 2021-05-15 MED ORDER — DULOXETINE HCL 60 MG PO CPEP: 60.0000 mg | ORAL_CAPSULE | Freq: Every day | ORAL | 0 refills | Status: DC

## 2021-05-15 MED ORDER — METOPROLOL TARTRATE 50 MG PO TABS: 50.0000 mg | ORAL_TABLET | Freq: Two times a day (BID) | ORAL | 1 refills | Status: DC

## 2021-05-15 MED ORDER — METFORMIN HCL 500 MG PO TABS: 1000.0000 mg | ORAL_TABLET | Freq: Two times a day (BID) | ORAL | 1 refills | Status: DC

## 2021-05-15 MED ORDER — GABAPENTIN 300 MG PO CAPS: 900.0000 mg | ORAL_CAPSULE | Freq: Two times a day (BID) | ORAL | 2 refills | Status: DC

## 2021-05-15 MED ORDER — ATORVASTATIN CALCIUM 40 MG PO TABS: 40.0000 mg | ORAL_TABLET | Freq: Every evening | ORAL | 3 refills | Status: DC

## 2021-05-15 MED ORDER — ALBUTEROL SULFATE (2.5 MG/3ML) 0.083% IN NEBU: 2.5000 mg | INHALATION_SOLUTION | Freq: Four times a day (QID) | RESPIRATORY_TRACT | 1 refills | Status: DC | PRN

## 2021-05-15 NOTE — Assessment & Plan Note (Signed)
No further cough related syncope.  Overall doing well.  Remember exercise treadmill test in the past that showed chronotropic competence.  He is able to increase his heart rate just fine.  No current AV block noted.  EKG okay.

## 2021-05-15 NOTE — Assessment & Plan Note (Signed)
Dr. Alinda Money.  Partial left nephrectomy.  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  Speaking with surgeon about this.

## 2021-05-15 NOTE — Assessment & Plan Note (Signed)
Continue to encourage him to decrease carbohydrates, weight loss.  On medications including metformin per primary team.

## 2021-05-15 NOTE — Assessment & Plan Note (Signed)
Currently on atorvastatin 40 mg.  Excellent with diabetes.  His LDL was 66.

## 2021-05-15 NOTE — Patient Instructions (Signed)
Medication Instructions:  The current medical regimen is effective;  continue present plan and medications.  *If you need a refill on your cardiac medications before your next appointment, please call your pharmacy*  Follow-Up: At [facility], you and your health needs are our priority.  As part of our continuing mission to provide you with exceptional heart care, we have created designated Provider Care Teams.  These Care Teams include your primary Cardiologist (physician) and Advanced Practice Providers (APPs -  Physician Assistants and Nurse Practitioners) who all work together to provide you with the care you need, when you need it.  We recommend signing up for the patient portal called "MyChart".  Sign up information is provided on this After Visit Summary.  MyChart is used to connect with patients for Virtual Visits (Telemedicine).  Patients are able to view lab/test results, encounter notes, upcoming appointments, etc.  Non-urgent messages can be sent to your provider as well.   To learn more about what you can do with MyChart, go to [email or web address].    Your next appointment:   Follow up as needed with Dr Marlou Porch.  Thank you for choosing [facility]!!

## 2021-05-15 NOTE — Progress Notes (Signed)
Assessment & Plan:  Preston [name] was seen today for hypertension and medication refill.  Diagnoses and all orders for this visit:  Primary hypertension -     amLODipine (NORVASC) 10 MG tablet; Take 1 tablet (10 mg total) by mouth at bedtime. -     lisinopril (ZESTRIL) 40 MG tablet; Take 1 tablet (40 mg total) by mouth daily. -     metoprolol tartrate (LOPRESSOR) 50 MG tablet; Take 1 tablet (50 mg total) by mouth 2 (two) times daily. Continue all antihypertensives as prescribed.  Remember to bring in your blood pressure log with you for your follow up appointment.  DASH/Mediterranean Diets are healthier choices for HTN.    Mixed hyperlipidemia -     atorvastatin (LIPITOR) 40 MG tablet; Take 1 tablet (40 mg total) by mouth every evening. INSTRUCTIONS: Work on a low fat, heart healthy diet and participate in regular aerobic exercise program by working out at least 150 minutes per week; 5 days a week-30 minutes per day. Avoid red meat/beef/steak,  fried foods. junk foods, sodas, sugary drinks, unhealthy snacking, alcohol and smoking.  Drink at least 80 oz of water per day and monitor your carbohydrate intake daily.    Chronic bronchitis, unspecified chronic bronchitis type (HCC) -     albuterol (PROVENTIL) (2.5 MG/3ML) 0.083% nebulizer solution; Take 3 mLs (2.5 mg total) by nebulization every 6 (six) hours as needed for wheezing or shortness of breath.  Chronic gout without tophus, unspecified cause, unspecified site -     allopurinol (ZYLOPRIM) 300 MG tablet; Take 1 tablet (300 mg total) by mouth at bedtime.  Type 2 diabetes mellitus with other specified complication, without long-term current use of insulin (HCC) -     metFORMIN (GLUCOPHAGE) 500 MG tablet; Take 2 tablets (1,000 mg total) by mouth 2 (two) times daily with a meal. -     Ambulatory referral to Ophthalmology -     Hemoglobin A1c -     CMP14+EGFR Continue blood sugar control as discussed in office today, low carbohydrate diet,  and regular physical exercise as tolerated, 150 minutes per week (30 min each day, 5 days per week, or 50 min 3 days per week). Keep blood sugar logs with fasting goal of 90-130 mg/dl, post prandial (after you eat) less than 180.  For Hypoglycemia: BS <60 and Hyperglycemia BS >400; contact the clinic ASAP. Annual eye exams and foot exams are recommended.   Diabetic polyneuropathy associated with type 2 diabetes mellitus (HCC) -     DULoxetine (CYMBALTA) 60 MG capsule; Take 1 capsule (60 mg total) by mouth daily. -     gabapentin (NEURONTIN) 300 MG capsule; Take 3 capsules (900 mg total) by mouth 2 (two) times daily.  Skin lesion of left arm -     Ambulatory referral to Dermatology   Patient has been counseled on age-appropriate routine health concerns for screening and prevention. These are reviewed and up-to-date. Referrals have been placed accordingly. Immunizations are up-to-date or declined.    Subjective:   Chief Complaint  Patient presents with   Hypertension   Medication Refill   Preston [name] 50 y.o. male presents to office today for follow up. He has a past medical history of Anxiety, Asthma, Cancer, CHF, Chronic cough, Chronic lower back pain, COPD, Dyspnea, Edentulous, GERD, Gout, History of Clostridium difficile infection (2014), Hypercholesterolemia, Hypertension, Insulin dependent diabetes mellitus, Osteoarthritis, Panic attacks, and Shoulder pain, right (04/18/2017).   HTN  Blood pressure slightly elevated however he just  drank caffeine coffee before his office visit. He is currently taking lisinopril 29m daily, amlodipine 10 mg daily and metoprolol 50 mg twice daily.Denies chest pain, worsening shortness of breath, palpitations, lightheadedness, dizziness, headaches or BLE edema.   BP Readings from Last 3 Encounters:  05/15/21 (!) 141/94  05/15/21 134/70  01/11/21 (!) 135/97     DM 2 Well-controlled with metformin 1000 mg twice daily.  He does have significant  diabetic peripheral neuropathy for which he takes gabapentin 900 mg and Cymbalta 60 mg daily.  LDL at goal with atorvastatin 40 mg daily.  He denies any statin intolerance or myalgias. Lab Results  Component Value Date   HGBA1C 6.6 (A) 01/11/2021    Lab Results  Component Value Date   LDLCALC 66 01/11/2021    Review of Systems  Constitutional:  Negative for fever, malaise/fatigue and weight loss.  HENT: Negative.  Negative for nosebleeds.   Eyes: Negative.  Negative for blurred vision, double vision and photophobia.  Respiratory: Negative.  Negative for cough and shortness of breath.   Cardiovascular: Negative.  Negative for chest pain, palpitations and leg swelling.  Gastrointestinal: Negative.  Negative for heartburn, nausea and vomiting.  Musculoskeletal: Negative.  Negative for myalgias.  Neurological: Negative.  Negative for dizziness, focal weakness, seizures and headaches.  Psychiatric/Behavioral: Negative.  Negative for suicidal ideas.    Past Medical History:  Diagnosis Date   Anxiety    Asthma    daily inhaler   Cancer ([place]    pt. reports diagnosed with skin cancer on face/uncertain type but he reports may be basal cell, pending surgery to remove 09/03/18   CHF (congestive heart failure) ([place]    no cardiologist   Chronic cough    states due to COPD   Chronic lower back pain    COPD (chronic obstructive pulmonary disease) ([place]    no home O2   Dyspnea    with humidity and high pollen count   Edentulous    upper gum   GERD (gastroesophageal reflux disease)    Gout    History of Clostridium difficile infection 2014   Hypercholesterolemia    Hypertension    states under control with med., has been on med. x 4 yr.   Insulin dependent diabetes mellitus    type 2    Osteoarthritis    bilat. hip   Panic attacks    Shoulder pain, right 04/18/2017    Past Surgical History:  Procedure Laterality Date   COLON SURGERY     14 inches of colon removed   COLONOSCOPY  WITH PROPOFOL N/A 04/01/2013   Procedure: COLONOSCOPY WITH PROPOFOL;  Surgeon: WArta Silence MD;  [facility];  Service: Endoscopy;  Laterality: N/A;   FLEXIBLE SIGMOIDOSCOPY N/A 02/28/2013   Procedure: FLEXIBLE SIGMOIDOSCOPY;  Surgeon: JWinfield Cunas, MD;  [facility];  Service: Endoscopy;  Laterality: N/A;   LAPAROSCOPIC APPENDECTOMY  07/05/2009   LAPAROSCOPIC LOW ANTERIOR RESECTION N/A 07/08/2013   Procedure: LAPAROSCOPIC LOW ANTERIOR RESECTION WITH TAKEDOWN OF SPLENIC FLEXURE.;  Surgeon: HAdin Hector MD;  [place]  Service: General;  Laterality: N/A;   MANDIBLE SURGERY Left 2010   "dry skin pocket cut out"   MULTIPLE EXTRACTIONS WITH ALVEOLOPLASTY N/A 04/24/2017   Procedure: SURGICAL REMOVAL OF TEETH NUMBERS `7 20-29 AND 31;  Surgeon: DMichael Litter DMD;  [place] [facility]  Service: Oral Surgery;  Laterality: N/A;   ROBOTIC ASSITED PARTIAL NEPHRECTOMY Left 05/02/2020   Procedure: XI  ROBOTIC ASSITED LAPAROSCOPIC PARTIAL NEPHRECTOMY;  Surgeon: Raynelle Bring, MD;  [facility];  Service: Urology;  Laterality: Left;   TOTAL HIP ARTHROPLASTY Left 06/25/2016   Procedure: TOTAL HIP ARTHROPLASTY ANTERIOR APPROACH;  Surgeon: Frederik Pear, MD;  Location: [place];  Service: Orthopedics;  Laterality: Left;   TOTAL HIP ARTHROPLASTY Right 09/10/2016   Procedure: TOTAL HIP ARTHROPLASTY ANTERIOR APPROACH;  Surgeon: Frederik Pear, MD;  Location: [place];  Service: Orthopedics;  Laterality: Right;    Family History  Problem Relation Age of Onset   Cancer Father        lymphoma   Heart attack Mother     Social History Reviewed with no changes to be made today.   Outpatient Medications Prior to Visit  Medication Sig Dispense Refill   Blood Glucose Monitoring Suppl (ACCU-CHEK AVIVA PLUS) w/Device KIT 1 each by Does not apply route 3 (three) times daily. 1 kit 0   calcium carbonate (TUMS EX) 750 MG chewable tablet Chew 1 tablet by mouth 3 (three) times  daily as needed for heartburn.     Colchicine (MITIGARE) 0.6 MG CAPS Take 0.6 mg by mouth daily as needed. M10.9 30 capsule 3   famotidine (PEPCID) 20 MG tablet Take 1 tablet by mouth once daily 90 tablet 0   glucose blood (ACCU-CHEK AVIVA PLUS) test strip Use as instructed for 3 times daily testing of blood sugar. E11.9 300 each 2   hydrOXYzine (ATARAX/VISTARIL) 50 MG tablet Take 1 tablet (50 mg total) by mouth 3 (three) times daily as needed. for anxiety 90 tablet 1   PROAIR HFA 108 (90 Base) MCG/ACT inhaler INHALE 1 TO 2 PUFFS BY MOUTH EVERY 6 HOURS AS NEEDED FOR WHEEZING FOR SHORTNESS OF BREATH 8.5 g 2   albuterol (PROVENTIL) (2.5 MG/3ML) 0.083% nebulizer solution Take 3 mLs (2.5 mg total) by nebulization every 6 (six) hours as needed for wheezing or shortness of breath. 150 mL 1   allopurinol (ZYLOPRIM) 300 MG tablet Take 1 tablet (300 mg total) by mouth at bedtime. 90 tablet 0   amLODipine (NORVASC) 10 MG tablet TAKE 1 TABLET BY MOUTH AT BEDTIME 90 tablet 0   atorvastatin (LIPITOR) 40 MG tablet TAKE 1 TABLET BY MOUTH ONCE DAILY IN THE EVENING 90 tablet 0   DULoxetine (CYMBALTA) 60 MG capsule Take 1 capsule (60 mg total) by mouth daily. 90 capsule 0   gabapentin (NEURONTIN) 300 MG capsule TAKE 3 CAPSULES BY MOUTH TWICE DAILY 180 capsule 2   lisinopril (ZESTRIL) 40 MG tablet Take 1 tablet (40 mg total) by mouth daily. 90 tablet 0   metFORMIN (GLUCOPHAGE) 500 MG tablet Take 2 tablets (1,000 mg total) by mouth 2 (two) times daily with a meal. 360 tablet 1   metoprolol tartrate (LOPRESSOR) 50 MG tablet Take 1 tablet (50 mg total) by mouth 2 (two) times daily. 180 tablet 1   No facility-administered medications prior to visit.    Allergies  Allergen Reactions   Morphine Itching and Nausea And Vomiting   Adhesive [Tape] Itching and Rash       Objective:    BP (!) 141/94   Pulse 84   Ht _0  (1.88 m)   Wt (!) 301 lb (136.5 kg)   SpO2 97%   BMI 38.65 kg/m  Wt Readings from Last 3  Encounters:  05/15/21 (!) 301 lb (136.5 kg)  05/15/21 (!) 302 lb (137 kg)  01/11/21 298 lb (135.2 kg)    Physical Exam Vitals and nursing  note reviewed.  Constitutional:      Appearance: He is well-developed.  HENT:     Head: Normocephalic and atraumatic.  Cardiovascular:     Rate and Rhythm: Normal rate and regular rhythm.     Heart sounds: Normal heart sounds. No murmur heard.   No friction rub. No gallop.  Pulmonary:     Effort: Pulmonary effort is normal. No tachypnea or respiratory distress.     Breath sounds: Normal breath sounds. No decreased breath sounds, wheezing, rhonchi or rales.  Chest:     Chest wall: No tenderness.  Abdominal:     General: Bowel sounds are normal.     Palpations: Abdomen is soft.  Musculoskeletal:        General: Normal range of motion.     Cervical back: Normal range of motion.  Skin:    General: Skin is warm and dry.     Comments:  Keratotic appearing papular skin lesion on left anterior lower forearm. Hyperpigmentation is present as well. There is also a macular hyperpigmented lesion adjacent to aforementioned lesion.    Neurological:     Mental Status: He is alert and oriented to person, place, and time.     Coordination: Coordination normal.  Psychiatric:        Behavior: Behavior normal. Behavior is cooperative.        Thought Content: Thought content normal.        Judgment: Judgment normal.       Patient has been counseled extensively about nutrition and exercise as well as the importance of adherence with medications and regular follow-up. The patient was given clear instructions to go to ER or return to medical center if symptoms don't improve, worsen or new problems develop. The patient verbalized understanding.   Follow-up: Return in about 3 months (around 08/15/2021).   Gildardo Pounds, FNP-BC [facility] [place], [place]   05/15/2021, 2:15 PM

## 2021-05-15 NOTE — Progress Notes (Signed)
Cardiology Office Note:    Date:  05/15/2021   ID:  Preston [name], DOB Aug 23, 1970, MRN [id number]  PCP:  Gildardo Pounds, NP   [facility] Providers Cardiologist:  Candee Furbish, MD     Referring MD: Gildardo Pounds, NP    History of Present Illness:    Preston [name] is a 50 y.o. male here for follow up of cough related syncope. Dr. Rayann Heman.  Forceful cough, transient loss of consciousness on 2 separate occasions.  Shortness of breath with moderate activity. HTN, DM, neuropathy, HL, left partial nephrectomy with Dr. Alinda Money on 05/02/20,  -  Clear-cell renal cell carcinoma, Grade 2, 4.2 cm  -  Margins uninvolved by carcinoma  COPD    Past Medical History:  Diagnosis Date   Anxiety    Asthma    daily inhaler   Cancer ([place])    pt. reports diagnosed with skin cancer on face/uncertain type but he reports may be basal cell, pending surgery to remove 09/03/18   CHF (congestive heart failure) ([place])    no cardiologist   Chronic cough    states due to COPD   Chronic lower back pain    COPD (chronic obstructive pulmonary disease) (HCC)    no home O2   Dyspnea    with humidity and high pollen count   Edentulous    upper gum   GERD (gastroesophageal reflux disease)    Gout    History of Clostridium difficile infection 2014   Hypercholesterolemia    Hypertension    states under control with med., has been on med. x 4 yr.   Insulin dependent diabetes mellitus    type 2    Osteoarthritis    bilat. hip   Panic attacks    Shoulder pain, right 04/18/2017    Past Surgical History:  Procedure Laterality Date   COLON SURGERY     14 inches of colon removed   COLONOSCOPY WITH PROPOFOL N/A 04/01/2013   Procedure: COLONOSCOPY WITH PROPOFOL;  Surgeon: Arta Silence, MD;  [facility];  Service: Endoscopy;  Laterality: N/A;   FLEXIBLE SIGMOIDOSCOPY N/A 02/28/2013   Procedure: FLEXIBLE SIGMOIDOSCOPY;  Surgeon: Winfield Cunas., MD;  [facility];  Service:  Endoscopy;  Laterality: N/A;   LAPAROSCOPIC APPENDECTOMY  07/05/2009   LAPAROSCOPIC LOW ANTERIOR RESECTION N/A 07/08/2013   Procedure: LAPAROSCOPIC LOW ANTERIOR RESECTION WITH TAKEDOWN OF SPLENIC FLEXURE.;  Surgeon: Adin Hector, MD;  Location: [place];  Service: General;  Laterality: N/A;   MANDIBLE SURGERY Left 2010   "dry skin pocket cut out"   MULTIPLE EXTRACTIONS WITH ALVEOLOPLASTY N/A 04/24/2017   Procedure: SURGICAL REMOVAL OF TEETH NUMBERS `7 20-29 AND 31;  Surgeon: Michael Litter, DMD;  Location: [place];  Service: Oral Surgery;  Laterality: N/A;   ROBOTIC ASSITED PARTIAL NEPHRECTOMY Left 05/02/2020   Procedure: XI ROBOTIC ASSITED LAPAROSCOPIC PARTIAL NEPHRECTOMY;  Surgeon: Raynelle Bring, MD;  [facility];  Service: Urology;  Laterality: Left;   TOTAL HIP ARTHROPLASTY Left 06/25/2016   Procedure: TOTAL HIP ARTHROPLASTY ANTERIOR APPROACH;  Surgeon: Frederik Pear, MD;  Location: [place];  Service: Orthopedics;  Laterality: Left;   TOTAL HIP ARTHROPLASTY Right 09/10/2016   Procedure: TOTAL HIP ARTHROPLASTY ANTERIOR APPROACH;  Surgeon: Frederik Pear, MD;  Location: [place];  Service: Orthopedics;  Laterality: Right;    Current Medications: Current Meds  Medication Sig   albuterol (PROVENTIL) (2.5 MG/3ML) 0.083% nebulizer solution Take 3 mLs (2.5 mg total)  by nebulization every 6 (six) hours as needed for wheezing or shortness of breath.   allopurinol (ZYLOPRIM) 300 MG tablet Take 1 tablet (300 mg total) by mouth at bedtime.   amLODipine (NORVASC) 10 MG tablet TAKE 1 TABLET BY MOUTH AT BEDTIME   atorvastatin (LIPITOR) 40 MG tablet TAKE 1 TABLET BY MOUTH ONCE DAILY IN THE EVENING   Blood Glucose Monitoring Suppl (ACCU-CHEK AVIVA PLUS) w/Device KIT 1 each by Does not apply route 3 (three) times daily.   calcium carbonate (TUMS EX) 750 MG chewable tablet Chew 1 tablet by mouth 3 (three) times daily as needed for heartburn.   Colchicine (MITIGARE) 0.6 MG CAPS Take 0.6 mg by  mouth daily as needed. M10.9   DULoxetine (CYMBALTA) 60 MG capsule Take 1 capsule (60 mg total) by mouth daily.   famotidine (PEPCID) 20 MG tablet Take 1 tablet by mouth once daily   gabapentin (NEURONTIN) 300 MG capsule TAKE 3 CAPSULES BY MOUTH TWICE DAILY   glucose blood (ACCU-CHEK AVIVA PLUS) test strip Use as instructed for 3 times daily testing of blood sugar. E11.9   hydrOXYzine (ATARAX/VISTARIL) 50 MG tablet Take 1 tablet (50 mg total) by mouth 3 (three) times daily as needed. for anxiety   lisinopril (ZESTRIL) 40 MG tablet Take 1 tablet (40 mg total) by mouth daily.   metFORMIN (GLUCOPHAGE) 500 MG tablet Take 2 tablets (1,000 mg total) by mouth 2 (two) times daily with a meal.   metoprolol tartrate (LOPRESSOR) 50 MG tablet Take 1 tablet (50 mg total) by mouth 2 (two) times daily.   PROAIR HFA 108 (90 Base) MCG/ACT inhaler INHALE 1 TO 2 PUFFS BY MOUTH EVERY 6 HOURS AS NEEDED FOR WHEEZING FOR SHORTNESS OF BREATH     Allergies:   Morphine and Adhesive [tape]   Social History   Socioeconomic History   Marital status: Significant Other    Spouse name: Not on file   Number of children: 2   Years of education: 08   Highest education level: Not on file  Occupational History   Not on file  Tobacco Use   Smoking status: Every Day    Packs/day: 1.00    Years: 35.00    Pack years: 35.00    Types: Cigarettes   Smokeless tobacco: Former    Types: Nurse, children's Use: Never used  Substance and Sexual Activity   Alcohol use: Yes    Comment: 6 pack daily    Drug use: No   Sexual activity: Yes  Other Topics Concern   Not on file  Social History Narrative   Not on file   Social Determinants of Health   Financial Resource Strain: Not on file  Food Insecurity: Not on file  Transportation Needs: Not on file  Physical Activity: Not on file  Stress: Not on file  Social Connections: Not on file     Family History: The patient's family history includes Cancer in his  father; Heart attack in his mother.  ROS:   Please see the history of present illness.     All other systems reviewed and are negative.  EKGs/Labs/Other Studies Reviewed:    The following studies were reviewed today:  ECHO 2021:    1. Left ventricular ejection fraction, by estimation, is 55 to 60%. The  left ventricle has normal function. The left ventricle has no regional  wall motion abnormalities. There is mild concentric left ventricular  hypertrophy. Left ventricular diastolic  parameters are  consistent with Grade II diastolic dysfunction  (pseudonormalization). Elevated left atrial pressure.   2. Right ventricular systolic function is normal. The right ventricular  size is normal.   3. Left atrial size was mildly dilated.   4. The mitral valve is normal in structure. No evidence of mitral valve  regurgitation. No evidence of mitral stenosis.   5. The aortic valve is normal in structure. Aortic valve regurgitation is  not visualized. No aortic stenosis is present.   6. The inferior vena cava is normal in size with greater than 50%  respiratory variability, suggesting right atrial pressure of 3 mmHg.   Comparison(s): Prior images reviewed side by side. Changes from prior  study are noted. No structural changes are seen, but there is now evidence  of elevated mean left atrial pressure.   ETT 01/04/20:  Very poor exercise tolerance.   Ability to exercise was primarily limited by back pain and leg weakness. He had 1:1 AV conduction without any episodes of AV block.   The study is non diagnostic due to a poor exercise tolerance and inability to achieve target heart rate. The study was helpful in excluding exertional conduction system disease/ second degree AV block as his functional limitation.      EKG:  EKG is  ordered today.  The ekg ordered today demonstrates NSR 95  Recent Labs: 01/11/2021: ALT 58; BUN 5; Creatinine, Ser 0.75; Hemoglobin 15.9; Platelets 291; Sodium  135 01/25/2021: Potassium 5.3  Recent Lipid Panel    Component Value Date/Time   CHOL 141 01/11/2021 1121   TRIG 191 (H) 01/11/2021 1121   HDL 43 01/11/2021 1121   CHOLHDL 3.3 01/11/2021 1121   CHOLHDL 5.6 12/13/2016 0605   VLDL UNABLE TO CALCULATE IF TRIGLYCERIDE OVER 400 mg/dL 12/13/2016 0605   LDLCALC 66 01/11/2021 1121     Risk Assessment/Calculations:          Physical Exam:    VS:  BP 134/70 (BP Location: Left Arm, Patient Position: Sitting, Cuff Size: Large)   Pulse 95   Ht _0  (1.905 m)   Wt (!) 302 lb (137 kg)   BMI 37.75 kg/m     Wt Readings from Last 3 Encounters:  05/15/21 (!) 302 lb (137 kg)  01/11/21 298 lb (135.2 kg)  05/18/20 291 lb 3.2 oz (132.1 kg)     GEN:  Well nourished, well developed in no acute distress HEENT: Normal NECK: No JVD; No carotid bruits LYMPHATICS: No lymphadenopathy CARDIAC: RRR, no murmurs, rubs, gallops RESPIRATORY:  Clear to auscultation without rales, wheezing or rhonchi  ABDOMEN: Soft, non-tender, non-distended, overweight, incisional hernia MUSCULOSKELETAL:  No edema; No deformity  SKIN: Warm and dry NEUROLOGIC:  Alert and oriented x 3 PSYCHIATRIC:  Normal affect   ASSESSMENT:    1. Essential hypertension, benign   2. Syncope and collapse   3. Neoplasm of left kidney   4. Diabetic polyneuropathy associated with type 2 diabetes mellitus ([place])   5. Pure hypercholesterolemia    PLAN:    In order of problems listed above:  Syncope and collapse No further cough related syncope.  Overall doing well.  Remember exercise treadmill test in the past that showed chronotropic competence.  He is able to increase his heart rate just fine.  No current AV block noted.  EKG okay.  Neoplasm of left kidney Dr. Alinda Money.  Partial left nephrectomy.  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  Speaking with surgeon about  this.  Diabetic polyneuropathy associated with type 2 diabetes mellitus  ([place]) Continue to encourage him to decrease carbohydrates, weight loss.  On medications including metformin per primary team.  HLD (hyperlipidemia) Currently on atorvastatin 40 mg.  Excellent with diabetes.  His LDL was 66.    From cardiac perspective, okay to visit [place] on an as-needed basis.  Please let us know if we can be of further assistance.     Medication Adjustments/Labs and Tests Ordered: Current medicines are reviewed at length with the patient today.  Concerns regarding medicines are outlined above.  Orders Placed This Encounter  Procedures   EKG 12-Lead   No orders of the defined types were placed in this encounter.   Patient Instructions  Medication Instructions:  The current medical regimen is effective;  continue present plan and medications.  *If you need a refill on your cardiac medications before your next appointment, please call your pharmacy*  Follow-Up: At [facility], you and your health needs are our priority.  As part of our continuing mission to provide you with exceptional heart care, we have created designated Provider Care Teams.  These Care Teams include your primary Cardiologist (physician) and Advanced Practice Providers (APPs -  Physician Assistants and Nurse Practitioners) who all work together to provide you with the care you need, when you need it.  We recommend signing up for the patient portal called "MyChart".  Sign up information is provided on this After Visit Summary.  MyChart is used to connect with patients for Virtual Visits (Telemedicine).  Patients are able to view lab/test results, encounter notes, upcoming appointments, etc.  Non-urgent messages can be sent to your provider as well.   To learn more about what you can do with MyChart, go to [email or web address].    Your next appointment:   Follow up as needed with Dr Marlou Porch.  Thank you for choosing [facility]!!     Signed, Candee Furbish, MD  05/15/2021 9:28 AM     [place]

## 2021-05-16 LAB — HEMOGLOBIN A1C
Est. average glucose Bld gHb Est-mCnc: 157 mg/dL
Hgb A1c MFr Bld: 7.1 % — ABNORMAL HIGH (ref 4.8–5.6)

## 2021-05-16 LAB — CMP14+EGFR
ALT: 77 IU/L — ABNORMAL HIGH (ref 0–44)
AST: 37 IU/L (ref 0–40)
Albumin/Globulin Ratio: 2.4 — ABNORMAL HIGH (ref 1.2–2.2)
Albumin: 5.1 g/dL — ABNORMAL HIGH (ref 4.0–5.0)
Alkaline Phosphatase: 70 IU/L (ref 44–121)
BUN/Creatinine Ratio: 10 (ref 9–20)
BUN: 7 mg/dL (ref 6–24)
Bilirubin Total: 0.3 mg/dL (ref 0.0–1.2)
CO2: 21 mmol/L (ref 20–29)
Calcium: 10.1 mg/dL (ref 8.7–10.2)
Chloride: 93 mmol/L — ABNORMAL LOW (ref 96–106)
Creatinine, Ser: 0.7 mg/dL — ABNORMAL LOW (ref 0.76–1.27)
Globulin, Total: 2.1 g/dL (ref 1.5–4.5)
Glucose: 145 mg/dL — ABNORMAL HIGH (ref 70–99)
Potassium: 5.5 mmol/L — ABNORMAL HIGH (ref 3.5–5.2)
Sodium: 133 mmol/L — ABNORMAL LOW (ref 134–144)
Total Protein: 7.2 g/dL (ref 6.0–8.5)
eGFR: 113 mL/min/{1.73_m2} (ref >59)

## 2021-05-24 DIAGNOSIS — C642 Malignant neoplasm of left kidney, except renal pelvis: Secondary | ICD-10-CM | POA: Diagnosis not present

## 2021-05-24 DIAGNOSIS — R1013 Epigastric pain: Secondary | ICD-10-CM | POA: Diagnosis not present

## 2021-05-29 ENCOUNTER — Ambulatory Visit: Payer: Medicaid Other | Admitting: Nurse Practitioner

## 2021-08-16 ENCOUNTER — Encounter: Payer: Self-pay | Admitting: Nurse Practitioner

## 2021-08-16 ENCOUNTER — Other Ambulatory Visit: Payer: Self-pay | Admitting: Nurse Practitioner

## 2021-08-16 ENCOUNTER — Other Ambulatory Visit: Payer: Self-pay

## 2021-08-16 ENCOUNTER — Ambulatory Visit: Payer: Medicaid Other | Attending: Nurse Practitioner | Admitting: Nurse Practitioner

## 2021-08-16 VITALS — BP 146/101 | HR 77 | Resp 16 | Wt 297.2 lb

## 2021-08-16 DIAGNOSIS — J418 Mixed simple and mucopurulent chronic bronchitis: Secondary | ICD-10-CM | POA: Diagnosis not present

## 2021-08-16 DIAGNOSIS — E1169 Type 2 diabetes mellitus with other specified complication: Secondary | ICD-10-CM | POA: Diagnosis not present

## 2021-08-16 DIAGNOSIS — M1A9XX0 Chronic gout, unspecified, without tophus (tophi): Secondary | ICD-10-CM | POA: Diagnosis not present

## 2021-08-16 DIAGNOSIS — I10 Essential (primary) hypertension: Secondary | ICD-10-CM | POA: Diagnosis not present

## 2021-08-16 DIAGNOSIS — K219 Gastro-esophageal reflux disease without esophagitis: Secondary | ICD-10-CM

## 2021-08-16 DIAGNOSIS — G8929 Other chronic pain: Secondary | ICD-10-CM | POA: Diagnosis not present

## 2021-08-16 DIAGNOSIS — D72829 Elevated white blood cell count, unspecified: Secondary | ICD-10-CM | POA: Diagnosis not present

## 2021-08-16 DIAGNOSIS — E1142 Type 2 diabetes mellitus with diabetic polyneuropathy: Secondary | ICD-10-CM

## 2021-08-16 DIAGNOSIS — Z23 Encounter for immunization: Secondary | ICD-10-CM

## 2021-08-16 DIAGNOSIS — M5442 Lumbago with sciatica, left side: Secondary | ICD-10-CM

## 2021-08-16 DIAGNOSIS — M5441 Lumbago with sciatica, right side: Secondary | ICD-10-CM

## 2021-08-16 DIAGNOSIS — E785 Hyperlipidemia, unspecified: Secondary | ICD-10-CM | POA: Diagnosis not present

## 2021-08-16 DIAGNOSIS — E782 Mixed hyperlipidemia: Secondary | ICD-10-CM

## 2021-08-16 LAB — POCT GLYCOSYLATED HEMOGLOBIN (HGB A1C): HbA1c, POC (controlled diabetic range): 7 % (ref 0.0–7.0)

## 2021-08-16 LAB — GLUCOSE, POCT (MANUAL RESULT ENTRY): POC Glucose: 235 mg/dl — AB (ref 70–99)

## 2021-08-16 MED ORDER — DULOXETINE HCL 60 MG PO CPEP: 60.0000 mg | ORAL_CAPSULE | Freq: Every day | ORAL | 0 refills | Status: DC

## 2021-08-16 MED ORDER — ALBUTEROL SULFATE (2.5 MG/3ML) 0.083% IN NEBU: 2.5000 mg | INHALATION_SOLUTION | Freq: Four times a day (QID) | RESPIRATORY_TRACT | 1 refills | Status: DC | PRN

## 2021-08-16 MED ORDER — ALLOPURINOL 300 MG PO TABS: 300.0000 mg | ORAL_TABLET | Freq: Every day | ORAL | 0 refills | Status: DC

## 2021-08-16 MED ORDER — PROAIR HFA 108 (90 BASE) MCG/ACT IN AERS: INHALATION_SPRAY | RESPIRATORY_TRACT | 2 refills | Status: DC

## 2021-08-16 MED ORDER — AMLODIPINE BESYLATE 10 MG PO TABS: 10.0000 mg | ORAL_TABLET | Freq: Every day | ORAL | 1 refills | Status: DC

## 2021-08-16 MED ORDER — FAMOTIDINE 20 MG PO TABS: 20.0000 mg | ORAL_TABLET | Freq: Every day | ORAL | 3 refills | Status: DC

## 2021-08-16 MED ORDER — LISINOPRIL 40 MG PO TABS: 40.0000 mg | ORAL_TABLET | Freq: Every day | ORAL | 0 refills | Status: DC

## 2021-08-16 MED ORDER — METFORMIN HCL 500 MG PO TABS: 1000.0000 mg | ORAL_TABLET | Freq: Two times a day (BID) | ORAL | 1 refills | Status: DC

## 2021-08-16 MED ORDER — GABAPENTIN 300 MG PO CAPS: 900.0000 mg | ORAL_CAPSULE | Freq: Two times a day (BID) | ORAL | 2 refills | Status: DC

## 2021-08-16 MED ORDER — METOPROLOL TARTRATE 50 MG PO TABS: 50.0000 mg | ORAL_TABLET | Freq: Two times a day (BID) | ORAL | 1 refills | Status: DC

## 2021-08-16 NOTE — Progress Notes (Signed)
Assessment & Plan:  Preston [name] was seen today for diabetes, hypertension, back pain and hip pain.  Diagnoses and all orders for this visit:  Type 2 diabetes mellitus with other specified complication, without long-term current use of insulin (HCC) -     POCT glucose (manual entry) -     POCT glycosylated hemoglobin (Hb A1C) -     Microalbumin / creatinine urine ratio -     metFORMIN (GLUCOPHAGE) 500 MG tablet; Take 2 tablets (1,000 mg total) by mouth 2 (two) times daily with a meal. Continue blood sugar control as discussed in office today, low carbohydrate diet, and regular physical exercise as tolerated, 150 minutes per week (30 min each day, 5 days per week, or 50 min 3 days per week). Keep blood sugar logs with fasting goal of 90-130 mg/dl, post prandial (after you eat) less than 180.  For Hypoglycemia: BS <60 and Hyperglycemia BS >400; contact the clinic ASAP. Annual eye exams and foot exams are recommended.   Dyslipidemia, goal LDL below 70 -     Lipid panel INSTRUCTIONS: Work on a low fat, heart healthy diet and participate in regular aerobic exercise program by working out at least 150 minutes per week; 5 days a week-30 minutes per day. Avoid red meat/beef/steak,  fried foods. junk foods, sodas, sugary drinks, unhealthy snacking, alcohol and smoking.  Drink at least 80 oz of water per day and monitor your carbohydrate intake daily.    Diabetic polyneuropathy associated with type 2 diabetes mellitus (HCC) -     gabapentin (NEURONTIN) 300 MG capsule; Take 3 capsules (900 mg total) by mouth 2 (two) times daily. -     DULoxetine (CYMBALTA) 60 MG capsule; Take 1 capsule (60 mg total) by mouth daily.  Primary hypertension -     lisinopril (ZESTRIL) 40 MG tablet; Take 1 tablet (40 mg total) by mouth daily. -     metoprolol tartrate (LOPRESSOR) 50 MG tablet; Take 1 tablet (50 mg total) by mouth 2 (two) times daily. -     amLODipine (NORVASC) 10 MG tablet; Take 1 tablet (10 mg total) by  mouth at bedtime. -     CMP14+EGFR Continue all antihypertensives as prescribed.  Remember to bring in your blood pressure log with you for your follow up appointment.  DASH/Mediterranean Diets are healthier choices for HTN.    Mixed simple and mucopurulent chronic bronchitis (HCC) -     PROAIR HFA 108 (90 Base) MCG/ACT inhaler; INHALE 1 TO 2 PUFFS BY MOUTH EVERY 6 HOURS AS NEEDED FOR WHEEZING FOR SHORTNESS OF BREATH -     albuterol (PROVENTIL) (2.5 MG/3ML) 0.083% nebulizer solution; Take 3 mLs (2.5 mg total) by nebulization every 6 (six) hours as needed for wheezing or shortness of breath.  Chronic gout without tophus, unspecified cause, unspecified site -     allopurinol (ZYLOPRIM) 300 MG tablet; Take 1 tablet (300 mg total) by mouth at bedtime.  Gastroesophageal reflux disease without esophagitis Symptoms controlled.  -     famotidine (PEPCID) 20 MG tablet; Take 1 tablet (20 mg total) by mouth daily. INSTRUCTIONS: Avoid GERD Triggers: acidic, spicy or fried foods, caffeine, coffee, sodas,  alcohol and chocolate.    Leukocytosis, unspecified type -     CBC with Differential  Need for Streptococcus pneumoniae vaccination -     Pneumococcal conjugate vaccine 20-valent  Chronic bilateral low back pain with bilateral sciatica -     MR Lumbar Spine Wo Contrast; Future  Patient has been counseled on age-appropriate routine health concerns for screening and prevention. These are reviewed and up-to-date. Referrals have been placed accordingly. Immunizations are up-to-date or declined.    Subjective:   Chief Complaint  Patient presents with   Diabetes   Hypertension   Back Pain    Lower    Hip Pain    B/l    [place] 51 y.o. male presents to office today for follow up to HTN, HPL and DM  Low Back Pain Chronic and ongoing over the past several years. Worse with prolonged standing and walking. Denies any injury or trauma. There is associated bilateral sciatica with  numbness to both feet. He also has a history of diabetic peripheral neuropathy for which he is taking gabapentin and cymbalta.     HTN Blood pressure is elevated today. He smoked cigarettes and just took his blood pressure medication 1 hour ago. Currently taking amlodipine 10 mg daily, lisinopril 40 mg daily and lopressor 50 mg BID as prescribed.  BP Readings from Last 3 Encounters:  08/16/21 (!) 146/101  05/15/21 (!) 141/94  05/15/21 134/70    DM  Not optimal at this time. Goal <7. He is currently taking metformin 1000 mg BID as prescribed. Will start additional glucose lowering medication at next visit if A1c continues elevated. LDL at goal with atorvastatin 40 mg daily.  Lab Results  Component Value Date   HGBA1C 7.0 08/16/2021   Lab Results  Component Value Date   LDLCALC 66 01/11/2021    Review of Systems  Constitutional:  Negative for fever, malaise/fatigue and weight loss.  HENT: Negative.  Negative for nosebleeds.   Eyes: Negative.  Negative for blurred vision, double vision and photophobia.  Respiratory:  Positive for cough, sputum production and wheezing. Negative for hemoptysis and shortness of breath.   Cardiovascular: Negative.  Negative for chest pain, palpitations and leg swelling.  Gastrointestinal: Negative.  Negative for heartburn, nausea and vomiting.  Musculoskeletal:  Positive for back pain and myalgias.  Neurological:  Positive for sensory change. Negative for dizziness, focal weakness, seizures and headaches.  Psychiatric/Behavioral: Negative.  Negative for suicidal ideas.    Past Medical History:  Diagnosis Date   Anxiety    Asthma    daily inhaler   Cancer ([place])    pt. reports diagnosed with skin cancer on face/uncertain type but he reports may be basal cell, pending surgery to remove 09/03/18   CHF (congestive heart failure) ([place])    no cardiologist   Chronic cough    states due to COPD   Chronic lower back pain    COPD (chronic obstructive pulmonary  disease) ([place])    no home O2   Dyspnea    with humidity and high pollen count   Edentulous    upper gum   GERD (gastroesophageal reflux disease)    Gout    History of Clostridium difficile infection 2014   Hypercholesterolemia    Hypertension    states under control with med., has been on med. x 4 yr.   Insulin dependent diabetes mellitus    type 2    Osteoarthritis    bilat. hip   Panic attacks    Shoulder pain, right 04/18/2017    Past Surgical History:  Procedure Laterality Date   COLON SURGERY     14 inches of colon removed   COLONOSCOPY WITH PROPOFOL N/A 04/01/2013   Procedure: COLONOSCOPY WITH PROPOFOL;  Surgeon: Arta Silence, MD;  Location: [facility];  Service: Endoscopy;  Laterality: N/A;   FLEXIBLE SIGMOIDOSCOPY N/A 02/28/2013   Procedure: FLEXIBLE SIGMOIDOSCOPY;  Surgeon: Winfield Cunas., MD;  Location: [facility] ENDOSCOPY;  Service: Endoscopy;  Laterality: N/A;   LAPAROSCOPIC APPENDECTOMY  07/05/2009   LAPAROSCOPIC LOW ANTERIOR RESECTION N/A 07/08/2013   Procedure: LAPAROSCOPIC LOW ANTERIOR RESECTION WITH TAKEDOWN OF SPLENIC FLEXURE.;  Surgeon: Adin Hector, MD;  Location: [place];  Service: General;  Laterality: N/A;   MANDIBLE SURGERY Left 2010   "dry skin pocket cut out"   MULTIPLE EXTRACTIONS WITH ALVEOLOPLASTY N/A 04/24/2017   Procedure: SURGICAL REMOVAL OF TEETH NUMBERS `7 20-29 AND 31;  Surgeon: Michael Litter, DMD;  Location: [place];  Service: Oral Surgery;  Laterality: N/A;   ROBOTIC ASSITED PARTIAL NEPHRECTOMY Left 05/02/2020   Procedure: XI ROBOTIC ASSITED LAPAROSCOPIC PARTIAL NEPHRECTOMY;  Surgeon: Raynelle Bring, MD;  Location: [facility];  Service: Urology;  Laterality: Left;   TOTAL HIP ARTHROPLASTY Left 06/25/2016   Procedure: TOTAL HIP ARTHROPLASTY ANTERIOR APPROACH;  Surgeon: Frederik Pear, MD;  Location: [place];  Service: Orthopedics;  Laterality: Left;   TOTAL HIP ARTHROPLASTY Right 09/10/2016   Procedure: TOTAL HIP ARTHROPLASTY ANTERIOR  APPROACH;  Surgeon: Frederik Pear, MD;  Location: [place];  Service: Orthopedics;  Laterality: Right;    Family History  Problem Relation Age of Onset   Cancer Father        lymphoma   Heart attack Mother     Social History Reviewed with no changes to be made today.   Outpatient Medications Prior to Visit  Medication Sig Dispense Refill   atorvastatin (LIPITOR) 40 MG tablet Take 1 tablet (40 mg total) by mouth every evening. 90 tablet 3   Blood Glucose Monitoring Suppl (ACCU-CHEK AVIVA PLUS) w/Device KIT 1 each by Does not apply route 3 (three) times daily. 1 kit 0   calcium carbonate (TUMS EX) 750 MG chewable tablet Chew 1 tablet by mouth 3 (three) times daily as needed for heartburn.     Colchicine (MITIGARE) 0.6 MG CAPS Take 0.6 mg by mouth daily as needed. M10.9 30 capsule 3   glucose blood (ACCU-CHEK AVIVA PLUS) test strip Use as instructed for 3 times daily testing of blood sugar. E11.9 300 each 2   hydrOXYzine (ATARAX/VISTARIL) 50 MG tablet Take 1 tablet (50 mg total) by mouth 3 (three) times daily as needed. for anxiety 90 tablet 1   albuterol (PROVENTIL) (2.5 MG/3ML) 0.083% nebulizer solution Take 3 mLs (2.5 mg total) by nebulization every 6 (six) hours as needed for wheezing or shortness of breath. 150 mL 1   allopurinol (ZYLOPRIM) 300 MG tablet Take 1 tablet (300 mg total) by mouth at bedtime. 90 tablet 0   amLODipine (NORVASC) 10 MG tablet Take 1 tablet (10 mg total) by mouth at bedtime. 90 tablet 1   DULoxetine (CYMBALTA) 60 MG capsule Take 1 capsule (60 mg total) by mouth daily. 90 capsule 0   famotidine (PEPCID) 20 MG tablet Take 1 tablet by mouth once daily 90 tablet 0   gabapentin (NEURONTIN) 300 MG capsule Take 3 capsules (900 mg total) by mouth 2 (two) times daily. 180 capsule 2   lisinopril (ZESTRIL) 40 MG tablet Take 1 tablet (40 mg total) by mouth daily. 90 tablet 0   metFORMIN (GLUCOPHAGE) 500 MG tablet Take 2 tablets (1,000 mg total) by mouth 2 (two) times daily with a  meal. 360 tablet 1   metoprolol tartrate (LOPRESSOR) 50 MG tablet Take 1 tablet (50 mg  total) by mouth 2 (two) times daily. 180 tablet 1   PROAIR HFA 108 (90 Base) MCG/ACT inhaler INHALE 1 TO 2 PUFFS BY MOUTH EVERY 6 HOURS AS NEEDED FOR WHEEZING FOR SHORTNESS OF BREATH 8.5 g 2   No facility-administered medications prior to visit.    Allergies  Allergen Reactions   Morphine Itching and Nausea And Vomiting   Adhesive [Tape] Itching and Rash       Objective:    BP (!) 146/101 (BP Location: Left Arm, Patient Position: Sitting, Cuff Size: Large)    Pulse 77    Resp 16    Wt 297 lb 3.2 oz (134.8 kg)    SpO2 96%    BMI 38.16 kg/m  Wt Readings from Last 3 Encounters:  08/16/21 297 lb 3.2 oz (134.8 kg)  05/15/21 (!) 301 lb (136.5 kg)  05/15/21 (!) 302 lb (137 kg)    Physical Exam Vitals and nursing note reviewed.  Constitutional:      Appearance: He is well-developed.  HENT:     Head: Normocephalic and atraumatic.  Cardiovascular:     Rate and Rhythm: Normal rate and regular rhythm.     Heart sounds: Normal heart sounds. No murmur heard.   No friction rub. No gallop.  Pulmonary:     Effort: Pulmonary effort is normal. No tachypnea or respiratory distress.     Breath sounds: Examination of the right-lower field reveals wheezing. Examination of the left-lower field reveals wheezing. Wheezing present. No decreased breath sounds, rhonchi or rales.  Chest:     Chest wall: No tenderness.  Abdominal:     General: Bowel sounds are normal.     Palpations: Abdomen is soft.  Musculoskeletal:        General: Normal range of motion.     Cervical back: Normal range of motion.  Skin:    General: Skin is warm and dry.  Neurological:     Mental Status: He is alert and oriented to person, place, and time.     Coordination: Coordination normal.  Psychiatric:        Behavior: Behavior normal. Behavior is cooperative.        Thought Content: Thought content normal.        Judgment: Judgment  normal.         Patient has been counseled extensively about nutrition and exercise as well as the importance of adherence with medications and regular follow-up. The patient was given clear instructions to go to ER or return to medical center if symptoms don't improve, worsen or new problems develop. The patient verbalized understanding.   Follow-up: Return in about 3 months (around 11/14/2021).   Gildardo Pounds, FNP-BC [facility] and [place], [place]   08/16/2021, 2:13 PM

## 2021-08-16 NOTE — Telephone Encounter (Signed)
Lisinopril refilled 08/16/2021 #90. Duloxetine refilled 08/16/2021 #90 Lipitor refilled 05/15/2021 #90 with 3 refills. (Enough for 1 year.)  Requested Prescriptions  Pending Prescriptions Disp Refills   lisinopril (ZESTRIL) 40 MG tablet [Pharmacy Med Name: Lisinopril 40 MG Oral Tablet] 90 tablet 0    Sig: Take 1 tablet by mouth once daily     Cardiovascular:  ACE Inhibitors Failed - 08/16/2021  6:54 PM      Failed - Cr in normal range and within 180 days    Creat  Date Value Ref Range Status  04/26/2016 0.77 0.60 - 1.35 mg/dL Final   Creatinine, Ser  Date Value Ref Range Status  05/15/2021 0.70 (L) 0.76 - 1.27 mg/dL Final   Creatinine,U  Date Value Ref Range Status  08/05/2013 138.5 mg/dL Final    Comment:      Cutoff Values for Urine Drug Screen:         Drug Class           Cutoff (ng/mL)         Amphetamines            1000         Barbiturates             200         Cocaine Metabolites      300         Benzodiazepines          200         Methadone                300         Opiates                 2000         Phencyclidine             25         Propoxyphene             300         Marijuana Metabolites     50   For medical purposes only.         Failed - K in normal range and within 180 days    Potassium  Date Value Ref Range Status  05/15/2021 5.5 (H) 3.5 - 5.2 mmol/L Final         Failed - Last BP in normal range    BP Readings from Last 1 Encounters:  08/16/21 (!) 146/101         Passed - Patient is not pregnant      Passed - Valid encounter within last 6 months    Recent Outpatient Visits          Today Type 2 diabetes mellitus with other specified complication, without long-term current use of insulin [facility])   [place], Vernia Buff, NP   3 months ago Primary hypertension   [place], Vernia Buff, NP   7 months ago Primary hypertension   [place], [place] W, NP   1 year ago Type 2 diabetes mellitus treated without insulin [facility])   [place], Vernia Buff, NP   1 year ago Hospital discharge follow-up   [place], Zelda W, NP      Future Appointments  In 3 months Gildardo Pounds, NP [place]            DULoxetine (CYMBALTA) 60 MG capsule [Pharmacy Med Name: DULoxetine HCl 60 MG Oral Capsule Delayed Release Particles] 90 capsule 0    Sig: Take 1 capsule by mouth once daily     Psychiatry: Antidepressants - SNRI Failed - 08/16/2021  6:54 PM      Failed - Last BP in normal range    BP Readings from Last 1 Encounters:  08/16/21 (!) 146/101         Passed - Completed PHQ-2 or PHQ-9 in the last 360 days      Passed - Valid encounter within last 6 months    Recent Outpatient Visits          Today Type 2 diabetes mellitus with other specified complication, without long-term current use of insulin [facility])   [place], Vernia Buff, NP   3 months ago Primary hypertension   [facility] [place], Vernia Buff, NP   7 months ago Primary hypertension   [place], Vernia Buff, NP   1 year ago Type 2 diabetes mellitus treated without insulin ([place])   [place], Vernia Buff, NP   1 year ago Hospital discharge follow-up   [place], Vernia Buff, NP      Future Appointments            In 3 months Gildardo Pounds, NP [place]            atorvastatin (LIPITOR) 40 MG tablet [Pharmacy Med Name: Atorvastatin Calcium 40 MG Oral Tablet] 90 tablet 0    Sig: TAKE 1 TABLET BY MOUTH ONCE DAILY IN THE EVENING     Cardiovascular:  Antilipid - Statins Failed - 08/16/2021  6:54 PM      Failed - Triglycerides in  normal range and within 360 days    Triglycerides  Date Value Ref Range Status  01/11/2021 191 (H) 0 - 149 mg/dL Final         Passed - Total Cholesterol in normal range and within 360 days    Cholesterol, Total  Date Value Ref Range Status  01/11/2021 141 100 - 199 mg/dL Final         Passed - LDL in normal range and within 360 days    LDL Chol Calc (NIH)  Date Value Ref Range Status  01/11/2021 66 0 - 99 mg/dL Final         Passed - HDL in normal range and within 360 days    HDL  Date Value Ref Range Status  01/11/2021 43 >39 mg/dL Final         Passed - Patient is not pregnant      Passed - Valid encounter within last 12 months    Recent Outpatient Visits          Today Type 2 diabetes mellitus with other specified complication, without long-term current use of insulin [facility])   [place], Vernia Buff, NP   3 months ago Primary hypertension   [place], Vernia Buff, NP   7 months ago Primary hypertension   [place], Vernia Buff, NP   1 year ago Type  2 diabetes mellitus treated without insulin [facility])   [place] Gildardo Pounds, NP   1 year ago Hospital discharge follow-up   [place], Zelda W, NP      Future Appointments            In 3 months Gildardo Pounds, NP [place]

## 2021-08-17 LAB — CBC WITH DIFFERENTIAL/PLATELET
Basophils Absolute: 0.1 10*3/uL (ref 0.0–0.2)
Basos: 1 %
EOS (ABSOLUTE): 0.2 10*3/uL (ref 0.0–0.4)
Eos: 2 %
Hematocrit: 47.3 % (ref 37.5–51.0)
Hemoglobin: 16.8 g/dL (ref 13.0–17.7)
Immature Grans (Abs): 0.1 10*3/uL (ref 0.0–0.1)
Immature Granulocytes: 1 %
Lymphocytes Absolute: 2 10*3/uL (ref 0.7–3.1)
Lymphs: 16 %
MCH: 31.6 pg (ref 26.6–33.0)
MCHC: 35.5 g/dL (ref 31.5–35.7)
MCV: 89 fL (ref 79–97)
Monocytes Absolute: 0.8 10*3/uL (ref 0.1–0.9)
Monocytes: 7 %
Neutrophils Absolute: 8.8 10*3/uL — ABNORMAL HIGH (ref 1.4–7.0)
Neutrophils: 73 %
Platelets: 323 10*3/uL (ref 150–450)
RBC: 5.31 x10E6/uL (ref 4.14–5.80)
RDW: 12.8 % (ref 11.6–15.4)
WBC: 12 10*3/uL — ABNORMAL HIGH (ref 3.4–10.8)

## 2021-08-17 LAB — CMP14+EGFR
ALT: 62 IU/L — ABNORMAL HIGH (ref 0–44)
AST: 31 IU/L (ref 0–40)
Albumin/Globulin Ratio: 2.2 (ref 1.2–2.2)
Albumin: 5.1 g/dL — ABNORMAL HIGH (ref 4.0–5.0)
Alkaline Phosphatase: 77 IU/L (ref 44–121)
BUN/Creatinine Ratio: 6 — ABNORMAL LOW (ref 9–20)
BUN: 5 mg/dL — ABNORMAL LOW (ref 6–24)
Bilirubin Total: 0.3 mg/dL (ref 0.0–1.2)
CO2: 24 mmol/L (ref 20–29)
Calcium: 10.8 mg/dL — ABNORMAL HIGH (ref 8.7–10.2)
Chloride: 89 mmol/L — ABNORMAL LOW (ref 96–106)
Creatinine, Ser: 0.8 mg/dL (ref 0.76–1.27)
Globulin, Total: 2.3 g/dL (ref 1.5–4.5)
Glucose: 193 mg/dL — ABNORMAL HIGH (ref 70–99)
Potassium: 5.2 mmol/L (ref 3.5–5.2)
Sodium: 133 mmol/L — ABNORMAL LOW (ref 134–144)
Total Protein: 7.4 g/dL (ref 6.0–8.5)
eGFR: 108 mL/min/{1.73_m2} (ref >59)

## 2021-08-17 LAB — MICROALBUMIN / CREATININE URINE RATIO
Creatinine, Urine: 51.6 mg/dL
Microalb/Creat Ratio: 951 mg/g creat — ABNORMAL HIGH (ref 0–29)
Microalbumin, Urine: 490.5 ug/mL

## 2021-08-17 LAB — LIPID PANEL
Chol/HDL Ratio: 6.4 ratio — ABNORMAL HIGH (ref 0.0–5.0)
Cholesterol, Total: 282 mg/dL — ABNORMAL HIGH (ref 100–199)
HDL: 44 mg/dL (ref >39)
LDL Chol Calc (NIH): 171 mg/dL — ABNORMAL HIGH (ref 0–99)
Triglycerides: 346 mg/dL — ABNORMAL HIGH (ref 0–149)
VLDL Cholesterol Cal: 67 mg/dL — ABNORMAL HIGH (ref 5–40)

## 2021-08-18 ENCOUNTER — Telehealth: Payer: Self-pay | Admitting: Nurse Practitioner

## 2021-08-18 MED ORDER — ALBUTEROL SULFATE HFA 108 (90 BASE) MCG/ACT IN AERS: 1.0000 | INHALATION_SPRAY | Freq: Four times a day (QID) | RESPIRATORY_TRACT | 3 refills | Status: DC | PRN

## 2021-08-18 NOTE — Telephone Encounter (Signed)
Copied from [place] [phone]. Topic: General - Other >> Aug 16, 2021  3:03 PM Yvette Rack wrote: Reason for CRM: [place] stated the [facility] HFA 108 [phone] Base) MCG/ACT inhaler is discontinued and asked if a Rx for Ventolin could be sent in.

## 2021-08-18 NOTE — Telephone Encounter (Signed)
Copied from [place] [phone]. Topic: General - Other >> Aug 17, 2021  2:52 PM Valere Dross wrote: Reason for CRM: Pts wife called in stating medication  PROAIR HFA 108 (90 Base) MCG/ACT inhaler is on back order indefinitely and requesting if a substitute could be sent over. Please advise.

## 2021-08-18 NOTE — Telephone Encounter (Signed)
Rx sent for Ventolin.

## 2021-08-22 ENCOUNTER — Other Ambulatory Visit: Payer: Self-pay | Admitting: Nurse Practitioner

## 2021-08-22 DIAGNOSIS — E878 Other disorders of electrolyte and fluid balance, not elsewhere classified: Secondary | ICD-10-CM

## 2021-08-22 DIAGNOSIS — J42 Unspecified chronic bronchitis: Secondary | ICD-10-CM

## 2021-08-22 DIAGNOSIS — I10 Essential (primary) hypertension: Secondary | ICD-10-CM

## 2021-08-22 DIAGNOSIS — D72829 Elevated white blood cell count, unspecified: Secondary | ICD-10-CM

## 2021-08-22 MED ORDER — AZITHROMYCIN 250 MG PO TABS: ORAL_TABLET | ORAL | 0 refills | Status: AC

## 2021-08-23 DIAGNOSIS — K439 Ventral hernia without obstruction or gangrene: Secondary | ICD-10-CM | POA: Diagnosis not present

## 2021-08-23 DIAGNOSIS — D3500 Benign neoplasm of unspecified adrenal gland: Secondary | ICD-10-CM | POA: Diagnosis not present

## 2021-08-23 DIAGNOSIS — C641 Malignant neoplasm of right kidney, except renal pelvis: Secondary | ICD-10-CM | POA: Diagnosis not present

## 2021-08-23 DIAGNOSIS — J432 Centrilobular emphysema: Secondary | ICD-10-CM | POA: Diagnosis not present

## 2021-08-23 DIAGNOSIS — J929 Pleural plaque without asbestos: Secondary | ICD-10-CM | POA: Diagnosis not present

## 2021-08-23 DIAGNOSIS — N281 Cyst of kidney, acquired: Secondary | ICD-10-CM | POA: Diagnosis not present

## 2021-08-23 DIAGNOSIS — C642 Malignant neoplasm of left kidney, except renal pelvis: Secondary | ICD-10-CM | POA: Diagnosis not present

## 2021-08-23 DIAGNOSIS — I700 Atherosclerosis of aorta: Secondary | ICD-10-CM | POA: Diagnosis not present

## 2021-08-23 DIAGNOSIS — I2510 Atherosclerotic heart disease of native coronary artery without angina pectoris: Secondary | ICD-10-CM | POA: Diagnosis not present

## 2021-08-24 ENCOUNTER — Telehealth: Payer: Self-pay

## 2021-08-24 NOTE — Telephone Encounter (Signed)
Called to inform about appt but the Vm was full and could not leave a vm.

## 2021-08-24 NOTE — Telephone Encounter (Signed)
Called pt but unable to leave a vm due to it being full.

## 2021-08-24 NOTE — Telephone Encounter (Signed)
-----   Message from Gildardo Pounds, NP sent at 08/22/2021 10:19 PM EST ----- Urine shows microscopic diabetic kidney changes. Make sure you are taking your lisinopril every day as prescribed.   Cholesterol levels are elevated. Make sure you take your cholesterol medication to help lower your risk of heart attack or stroke  White blood count elevated. I have sent an antibiotic. Will recheck in 3 weeks. Needs lab appt. If still elevated will need to send to blood specialist.   Also please order Lumbar MRI for chronic low back pain with sciatica. Thanks!

## 2021-08-25 ENCOUNTER — Telehealth: Payer: Self-pay

## 2021-08-25 DIAGNOSIS — D49511 Neoplasm of unspecified behavior of right kidney: Secondary | ICD-10-CM | POA: Diagnosis not present

## 2021-08-25 NOTE — Telephone Encounter (Signed)
Called patient reviewed all information and repeated back to me. Will call if any questions.  Spoke to [employer].

## 2021-08-25 NOTE — Telephone Encounter (Signed)
Called patient reviewed all information and repeated back to me. Will call if any questions.  Also gave the date time and place for MRI.

## 2021-08-27 ENCOUNTER — Encounter: Payer: Self-pay | Admitting: Nurse Practitioner

## 2021-08-28 ENCOUNTER — Other Ambulatory Visit: Payer: Self-pay | Admitting: Nurse Practitioner

## 2021-08-28 MED ORDER — DIAZEPAM 2 MG PO TABS: 2.0000 mg | ORAL_TABLET | Freq: Once | ORAL | 0 refills | Status: AC

## 2021-09-06 ENCOUNTER — Ambulatory Visit (HOSPITAL_COMMUNITY): Payer: Medicaid Other

## 2021-09-11 ENCOUNTER — Other Ambulatory Visit: Payer: Medicaid Other

## 2021-09-11 ENCOUNTER — Telehealth: Payer: Self-pay

## 2021-09-11 NOTE — Telephone Encounter (Signed)
Preston [name] returned call told her Prior Preston [name] was done and she stated if you needed her to return call as office was at lunch. [phone]

## 2021-09-11 NOTE — Telephone Encounter (Signed)
-----   Message from Genevieve Norlander sent at 09/11/2021 10:54 AM EST ----- Regarding: Preston [name] Morning,  Auth needed  Thanks, [employer]

## 2021-09-11 NOTE — Telephone Encounter (Signed)
Left message to return call to our office.  Prior Auth done and approved.

## 2021-09-11 NOTE — Telephone Encounter (Signed)
Prior Auth [id number] good 09/11/2021-10/26/2021

## 2021-09-11 NOTE — Telephone Encounter (Signed)
Noted  

## 2021-09-14 ENCOUNTER — Ambulatory Visit: Payer: Medicaid Other | Attending: Nurse Practitioner

## 2021-09-14 ENCOUNTER — Other Ambulatory Visit: Payer: Self-pay

## 2021-09-14 ENCOUNTER — Ambulatory Visit (HOSPITAL_COMMUNITY)
Admission: RE | Admit: 2021-09-14 | Discharge: 2021-09-14 | Disposition: A | Discharge status: Home / Self Care | Payer: Medicaid Other | Source: Ambulatory Visit | Attending: Nurse Practitioner | Admitting: Nurse Practitioner

## 2021-09-14 DIAGNOSIS — E878 Other disorders of electrolyte and fluid balance, not elsewhere classified: Secondary | ICD-10-CM

## 2021-09-14 DIAGNOSIS — M545 Low back pain, unspecified: Secondary | ICD-10-CM | POA: Diagnosis not present

## 2021-09-14 DIAGNOSIS — D72829 Elevated white blood cell count, unspecified: Secondary | ICD-10-CM | POA: Diagnosis not present

## 2021-09-14 DIAGNOSIS — M5442 Lumbago with sciatica, left side: Secondary | ICD-10-CM | POA: Diagnosis not present

## 2021-09-14 DIAGNOSIS — M5441 Lumbago with sciatica, right side: Secondary | ICD-10-CM | POA: Diagnosis present

## 2021-09-14 DIAGNOSIS — G8929 Other chronic pain: Secondary | ICD-10-CM | POA: Diagnosis present

## 2021-09-15 ENCOUNTER — Telehealth: Payer: Self-pay

## 2021-09-15 ENCOUNTER — Other Ambulatory Visit: Payer: Self-pay | Admitting: Nurse Practitioner

## 2021-09-15 DIAGNOSIS — M545 Low back pain, unspecified: Secondary | ICD-10-CM

## 2021-09-15 DIAGNOSIS — G8929 Other chronic pain: Secondary | ICD-10-CM

## 2021-09-15 LAB — CBC WITH DIFFERENTIAL/PLATELET
Basophils Absolute: 0.1 10*3/uL (ref 0.0–0.2)
Basos: 1 %
EOS (ABSOLUTE): 0.2 10*3/uL (ref 0.0–0.4)
Eos: 3 %
Hematocrit: 46.7 % (ref 37.5–51.0)
Hemoglobin: 16.9 g/dL (ref 13.0–17.7)
Immature Grans (Abs): 0.1 10*3/uL (ref 0.0–0.1)
Immature Granulocytes: 1 %
Lymphocytes Absolute: 2 10*3/uL (ref 0.7–3.1)
Lymphs: 20 %
MCH: 33 pg (ref 26.6–33.0)
MCHC: 36.2 g/dL — ABNORMAL HIGH (ref 31.5–35.7)
MCV: 91 fL (ref 79–97)
Monocytes Absolute: 0.8 10*3/uL (ref 0.1–0.9)
Monocytes: 8 %
Neutrophils Absolute: 6.5 10*3/uL (ref 1.4–7.0)
Neutrophils: 67 %
Platelets: 288 10*3/uL (ref 150–450)
RBC: 5.12 x10E6/uL (ref 4.14–5.80)
RDW: 12.8 % (ref 11.6–15.4)
WBC: 9.6 10*3/uL (ref 3.4–10.8)

## 2021-09-15 LAB — CMP14+EGFR
ALT: 57 IU/L — ABNORMAL HIGH (ref 0–44)
AST: 29 IU/L (ref 0–40)
Albumin/Globulin Ratio: 2.1 (ref 1.2–2.2)
Albumin: 5.1 g/dL — ABNORMAL HIGH (ref 4.0–5.0)
Alkaline Phosphatase: 81 IU/L (ref 44–121)
BUN/Creatinine Ratio: 8 — ABNORMAL LOW (ref 9–20)
BUN: 5 mg/dL — ABNORMAL LOW (ref 6–24)
Bilirubin Total: 0.2 mg/dL (ref 0.0–1.2)
CO2: 24 mmol/L (ref 20–29)
Calcium: 9.9 mg/dL (ref 8.7–10.2)
Chloride: 90 mmol/L — ABNORMAL LOW (ref 96–106)
Creatinine, Ser: 0.61 mg/dL — ABNORMAL LOW (ref 0.76–1.27)
Globulin, Total: 2.4 g/dL (ref 1.5–4.5)
Glucose: 176 mg/dL — ABNORMAL HIGH (ref 70–99)
Potassium: 5.5 mmol/L — ABNORMAL HIGH (ref 3.5–5.2)
Sodium: 130 mmol/L — ABNORMAL LOW (ref 134–144)
Total Protein: 7.5 g/dL (ref 6.0–8.5)
eGFR: 118 mL/min/{1.73_m2} (ref >59)

## 2021-09-15 NOTE — Telephone Encounter (Signed)
Pt spouse was called and is aware of results, DOB was confirmed.

## 2021-09-15 NOTE — Telephone Encounter (Signed)
-----   Message from Gildardo Pounds, NP sent at 09/15/2021  8:32 AM EST ----- MRI Notes mild arthritis or degeneration in lower spine. Referral made to physical medicine and rehab.

## 2021-09-22 ENCOUNTER — Encounter: Payer: Self-pay | Admitting: Physical Medicine & Rehabilitation

## 2021-11-13 ENCOUNTER — Other Ambulatory Visit: Payer: Self-pay | Admitting: Nurse Practitioner

## 2021-11-13 DIAGNOSIS — E1142 Type 2 diabetes mellitus with diabetic polyneuropathy: Secondary | ICD-10-CM

## 2021-11-14 NOTE — Telephone Encounter (Signed)
Requested Prescriptions  ?Pending Prescriptions Disp Refills  ?? DULoxetine (CYMBALTA) 60 MG capsule [Pharmacy Med Name: DULoxetine HCl 60 MG Oral Capsule Delayed Release Particles] 90 capsule 0  ?  Sig: Take 1 capsule by mouth once daily  ?  ? Psychiatry: Antidepressants - SNRI - duloxetine Failed - 11/13/2021  7:53 PM  ?  ?  Failed - Cr in normal range and within 360 days  ?  Creat  ?Date Value Ref Range Status  ?04/26/2016 0.77 0.60 - 1.35 mg/dL Final  ? ?Creatinine, Ser  ?Date Value Ref Range Status  ?09/14/2021 0.61 (L) 0.76 - 1.27 mg/dL Final  ? ?Creatinine,U  ?Date Value Ref Range Status  ?08/05/2013 138.5 mg/dL Final  ?  Comment:  ?    ?Cutoff Values for Urine Drug Screen: ?        Drug Class           Cutoff (ng/mL) ?        Amphetamines            1000 ?        Barbiturates             200 ?        Cocaine Metabolites      300 ?        Benzodiazepines          200 ?        Methadone                300 ?        Opiates                 2000 ?        Phencyclidine             25 ?        Propoxyphene             300 ?        Marijuana Metabolites     50 ?  ?For medical purposes only.  ?   ?  ?  Failed - Last BP in normal range  ?  BP Readings from Last 1 Encounters:  ?08/16/21 (!) 146/101  ?   ?  ?  Passed - eGFR is 30 or above and within 360 days  ?  GFR, Est African American  ?Date Value Ref Range Status  ?04/26/2016 >89 >=60 mL/min Final  ? ?GFR calc Af [place]  ?Date Value Ref Range Status  ?05/03/2020 >60 >60 mL/min Final  ? ?GFR, Est Non African American  ?Date Value Ref Range Status  ?04/26/2016 >89 >=60 mL/min Final  ? ?GFR calc non Af Amer  ?Date Value Ref Range Status  ?05/04/2020 >60 >60 mL/min Final  ? ?eGFR  ?Date Value Ref Range Status  ?09/14/2021 118 >59 mL/min/1.73 Final  ?   ?  ?  Passed - Completed PHQ-2 or PHQ-9 in the last 360 days  ?  ?  Passed - Valid encounter within last 6 months  ?  Recent Outpatient Visits   ?      ? 3 months ago Type 2 diabetes mellitus with other specified  complication, without long-term current use of insulin ([place])  ? [place], [place] W, NP  ? 6 months ago Primary hypertension  ? [place], [place] W, NP  ? 10 months ago Primary hypertension  ? [place]  [facility] Gildardo Pounds, NP  ? 1 year ago Type 2 diabetes mellitus treated without insulin ([place])  ? [place] Gildardo Pounds, NP  ? 1 year ago Hospital discharge follow-up  ? [place] Gildardo Pounds, NP  ?  ?  ?Future Appointments   ?        ? Tomorrow Gildardo Pounds, NP [place]  ? In 2 weeks [place], Luanna Salk, MD [facility] Physical Medicine and Rehabilitation, [place]  ?  ? ?  ?  ?  ? ?

## 2021-11-15 ENCOUNTER — Encounter: Payer: Self-pay | Admitting: Nurse Practitioner

## 2021-11-15 ENCOUNTER — Ambulatory Visit: Payer: Medicaid Other | Attending: Nurse Practitioner | Admitting: Nurse Practitioner

## 2021-11-15 VITALS — BP 148/91 | HR 81 | Wt 302.6 lb

## 2021-11-15 DIAGNOSIS — E1169 Type 2 diabetes mellitus with other specified complication: Secondary | ICD-10-CM | POA: Diagnosis not present

## 2021-11-15 DIAGNOSIS — F17200 Nicotine dependence, unspecified, uncomplicated: Secondary | ICD-10-CM

## 2021-11-15 DIAGNOSIS — I10 Essential (primary) hypertension: Secondary | ICD-10-CM | POA: Diagnosis not present

## 2021-11-15 DIAGNOSIS — F17210 Nicotine dependence, cigarettes, uncomplicated: Secondary | ICD-10-CM

## 2021-11-15 DIAGNOSIS — J449 Chronic obstructive pulmonary disease, unspecified: Secondary | ICD-10-CM | POA: Diagnosis not present

## 2021-11-15 LAB — POCT GLYCOSYLATED HEMOGLOBIN (HGB A1C): HbA1c, POC (controlled diabetic range): 7.2 % — AB (ref 0.0–7.0)

## 2021-11-15 LAB — GLUCOSE, POCT (MANUAL RESULT ENTRY): POC Glucose: 177 mg/dl — AB (ref 70–99)

## 2021-11-15 MED ORDER — PREDNISONE 20 MG PO TABS: 40.0000 mg | ORAL_TABLET | Freq: Every day | ORAL | 0 refills | Status: AC

## 2021-11-15 MED ORDER — ADVAIR HFA 230-21 MCG/ACT IN AERO: 2.0000 | INHALATION_SPRAY | Freq: Two times a day (BID) | RESPIRATORY_TRACT | 12 refills | Status: DC

## 2021-11-15 MED ORDER — METFORMIN HCL 500 MG PO TABS: 1000.0000 mg | ORAL_TABLET | Freq: Two times a day (BID) | ORAL | 1 refills | Status: DC

## 2021-11-15 MED ORDER — LISINOPRIL 40 MG PO TABS: 40.0000 mg | ORAL_TABLET | Freq: Every day | ORAL | 1 refills | Status: DC

## 2021-11-15 NOTE — Progress Notes (Signed)
? ?Assessment & Plan:  ?Preston [name] was seen today for hypertension, diabetes and copd. ? ?Diagnoses and all orders for this visit: ? ?Type 2 diabetes mellitus with other specified complication, without long-term current use of insulin (HCC) ?-     POCT glucose (manual entry) ?-     CMP14+EGFR ?-     metFORMIN (GLUCOPHAGE) 500 MG tablet; Take 2 tablets (1,000 mg total) by mouth 2 (two) times daily with a meal. ?-     POCT glycosylated hemoglobin (Hb A1C) ? ?Tobacco dependence ?Declines smoking cessation medication management ? ?Primary hypertension ?-     CMP14+EGFR ?-     lisinopril (ZESTRIL) 40 MG tablet; Take 1 tablet (40 mg total) by mouth daily. ? ?COPD mixed type ([place]) ?-     fluticasone-salmeterol (ADVAIR HFA) 230-21 MCG/ACT inhaler; Inhale 2 puffs into the lungs 2 (two) times daily. ?-     predniSONE (DELTASONE) 20 MG tablet; Take 2 tablets (40 mg total) by mouth daily with breakfast for 7 days. ? ? ? ?Patient has been counseled on age-appropriate routine health concerns for screening and prevention. These are reviewed and up-to-date. Referrals have been placed accordingly. Immunizations are up-to-date or declined.    ?Subjective:  ? ?Chief Complaint  ?Patient presents with  ? Hypertension  ? Diabetes  ? COPD  ? ?HPI ?Preston [name] 51 y.o. male presents to office today for follow up to HTN, HPL and DM ?He has a past medical history of Anxiety, Asthma,  CHF  Chronic lower back pain, COPD, GERD, Gout, History of Clostridium difficile infection (2014), Hypercholesterolemia, Hypertension, DM, Osteoarthritis, Panic attacks, and Shoulder pain, right (04/18/2017).  ? ? ?HTN ?Blood pressure is elevated today. He is still smoking. Endorses adherence taking amlodipine 10 mg daily and lisinopril 40 mg daily and metoprolol 50 mg BID.  ?BP Readings from Last 3 Encounters:  ?11/15/21 (!) 148/91  ?08/16/21 (!) 146/101  ?05/15/21 (!) 141/94  ? ?Tobacco Dependence ?Started smoking at the age of 66. Recently cut down back to  1/2 ppd of cigarettes instead of 1ppd.  ?He endorses chronic cough and wheezing. Symptoms chronic dyspnea does worsen with exertion. Patient uses 2 pillows at night.  Patient currently is not on home oxygen therapy.[name] Respiratory history: COPD  ? ? ?DM 2 ?Diabetes controlled. He is currently taking metformin 1000 mg BID as prescribed. Cholesterol levels not at goal with atorvastatin 40 mg daily.  ?Lab Results  ?Component Value Date  ? HGBA1C 7.2 (A) 11/15/2021  ?  ?Lab Results  ?Component Value Date  ? LDLCALC 171 (H) 08/16/2021  ?  ? ? ? ?Snores ?Wakes up gasping for air.  ? ? ?Review of Systems  ?Constitutional:  Negative for fever, malaise/fatigue and weight loss.  ?HENT: Negative.  Negative for nosebleeds.   ?Eyes: Negative.  Negative for blurred vision, double vision and photophobia.  ?Respiratory:  Positive for cough, sputum production, shortness of breath and wheezing. Negative for hemoptysis.   ?Cardiovascular: Negative.  Negative for chest pain, palpitations and leg swelling.  ?Gastrointestinal: Negative.  Negative for heartburn, nausea and vomiting.  ?Musculoskeletal: Negative.  Negative for myalgias.  ?Neurological: Negative.  Negative for dizziness, focal weakness, seizures and headaches.  ?Psychiatric/Behavioral: Negative.  Negative for suicidal ideas.   ? ?Past Medical History:  ?Diagnosis Date  ? Anxiety   ? Asthma   ? daily inhaler  ? Cancer [facility])   ? pt. reports diagnosed with skin cancer on face/uncertain type but he reports may be basal  cell, pending surgery to remove 09/03/18  ? CHF (congestive heart failure) ([place])   ? no cardiologist  ? Chronic cough   ? states due to COPD  ? Chronic lower back pain   ? COPD (chronic obstructive pulmonary disease) ([place])   ? no home O2  ? Dyspnea   ? with humidity and high pollen count  ? Edentulous   ? upper gum  ? GERD (gastroesophageal reflux disease)   ? Gout   ? History of Clostridium difficile infection 2014  ? Hypercholesterolemia   ? Hypertension   ? states  under control with med., has been on med. x 4 yr.  ? Insulin dependent diabetes mellitus   ? type 2   ? Osteoarthritis   ? bilat. hip  ? Panic attacks   ? Shoulder pain, right 04/18/2017  ? ? ?Past Surgical History:  ?Procedure Laterality Date  ? COLON SURGERY    ? 14 inches of colon removed  ? COLONOSCOPY WITH PROPOFOL N/A 04/01/2013  ? Procedure: COLONOSCOPY WITH PROPOFOL;  Surgeon: Arta Silence, MD;  Location: [facility] ENDOSCOPY;  Service: Endoscopy;  Laterality: N/A;  ? FLEXIBLE SIGMOIDOSCOPY N/A 02/28/2013  ? Procedure: FLEXIBLE SIGMOIDOSCOPY;  Surgeon: Winfield Cunas., MD;  Location: [facility] ENDOSCOPY;  Service: Endoscopy;  Laterality: N/A;  ? LAPAROSCOPIC APPENDECTOMY  07/05/2009  ? LAPAROSCOPIC LOW ANTERIOR RESECTION N/A 07/08/2013  ? Procedure: LAPAROSCOPIC LOW ANTERIOR RESECTION WITH TAKEDOWN OF SPLENIC FLEXURE.;  Surgeon: Adin Hector, MD;  Location: [place];  Service: General;  Laterality: N/A;  ? MANDIBLE SURGERY Left 2010  ? "dry skin pocket cut out"  ? MULTIPLE EXTRACTIONS WITH ALVEOLOPLASTY N/A 04/24/2017  ? Procedure: SURGICAL REMOVAL OF TEETH NUMBERS `7 20-29 AND 31;  Surgeon: Michael Litter, DMD;  Location: [place];  Service: Oral Surgery;  Laterality: N/A;  ? ROBOTIC ASSITED PARTIAL NEPHRECTOMY Left 05/02/2020  ? Procedure: XI ROBOTIC ASSITED LAPAROSCOPIC PARTIAL NEPHRECTOMY;  Surgeon: Raynelle Bring, MD;  Location: [facility] ORS;  Service: Urology;  Laterality: Left;  ? TOTAL HIP ARTHROPLASTY Left 06/25/2016  ? Procedure: TOTAL HIP ARTHROPLASTY ANTERIOR APPROACH;  Surgeon: Frederik Pear, MD;  Location: [place];  Service: Orthopedics;  Laterality: Left;  ? TOTAL HIP ARTHROPLASTY Right 09/10/2016  ? Procedure: TOTAL HIP ARTHROPLASTY ANTERIOR APPROACH;  Surgeon: Frederik Pear, MD;  Location: [place];  Service: Orthopedics;  Laterality: Right;  ? ? ?Family History  ?Problem Relation Age of Onset  ? Cancer Father   ?     lymphoma  ? Heart attack Mother   ? ? ?Social History Reviewed with no changes to be made  today.  ? ?Outpatient Medications Prior to Visit  ?Medication Sig Dispense Refill  ? albuterol (PROVENTIL) (2.5 MG/3ML) 0.083% nebulizer solution Take 3 mLs (2.5 mg total) by nebulization every 6 (six) hours as needed for wheezing or shortness of breath. 150 mL 1  ? albuterol (VENTOLIN HFA) 108 (90 Base) MCG/ACT inhaler Inhale 1-2 puffs into the lungs every 6 (six) hours as needed for wheezing or shortness of breath. 18 g 3  ? allopurinol (ZYLOPRIM) 300 MG tablet Take 1 tablet (300 mg total) by mouth at bedtime. 90 tablet 0  ? amLODipine (NORVASC) 10 MG tablet Take 1 tablet (10 mg total) by mouth at bedtime. 90 tablet 1  ? atorvastatin (LIPITOR) 40 MG tablet Take 1 tablet (40 mg total) by mouth every evening. 90 tablet 3  ? Blood Glucose Monitoring Suppl (ACCU-CHEK AVIVA PLUS) w/Device KIT 1 each by Does  not apply route 3 (three) times daily. 1 kit 0  ? calcium carbonate (TUMS EX) 750 MG chewable tablet Chew 1 tablet by mouth 3 (three) times daily as needed for heartburn.    ? Colchicine (MITIGARE) 0.6 MG CAPS Take 0.6 mg by mouth daily as needed. M10.9 30 capsule 3  ? DULoxetine (CYMBALTA) 60 MG capsule Take 1 capsule by mouth once daily 90 capsule 0  ? famotidine (PEPCID) 20 MG tablet Take 1 tablet (20 mg total) by mouth daily. 90 tablet 3  ? gabapentin (NEURONTIN) 300 MG capsule Take 3 capsules (900 mg total) by mouth 2 (two) times daily. 180 capsule 2  ? glucose blood (ACCU-CHEK AVIVA PLUS) test strip Use as instructed for 3 times daily testing of blood sugar. E11.9 300 each 2  ? hydrOXYzine (ATARAX/VISTARIL) 50 MG tablet Take 1 tablet (50 mg total) by mouth 3 (three) times daily as needed. for anxiety 90 tablet 1  ? metoprolol tartrate (LOPRESSOR) 50 MG tablet Take 1 tablet (50 mg total) by mouth 2 (two) times daily. 180 tablet 1  ? lisinopril (ZESTRIL) 40 MG tablet Take 1 tablet (40 mg total) by mouth daily. 90 tablet 0  ? metFORMIN (GLUCOPHAGE) 500 MG tablet Take 2 tablets (1,000 mg total) by mouth 2 (two)  times daily with a meal. 360 tablet 1  ? ?No facility-administered medications prior to visit.  ? ? ?Allergies  ?Allergen Reactions  ? Morphine Itching and Nausea And Vomiting  ? Adhesive [Tape] Itching

## 2021-11-16 ENCOUNTER — Encounter: Payer: Self-pay | Admitting: Nurse Practitioner

## 2021-11-16 LAB — CMP14+EGFR
ALT: 61 IU/L — ABNORMAL HIGH (ref 0–44)
AST: 32 IU/L (ref 0–40)
Albumin/Globulin Ratio: 2.5 — ABNORMAL HIGH (ref 1.2–2.2)
Albumin: 5 g/dL (ref 4.0–5.0)
Alkaline Phosphatase: 70 IU/L (ref 44–121)
BUN/Creatinine Ratio: 8 — ABNORMAL LOW (ref 9–20)
BUN: 5 mg/dL — ABNORMAL LOW (ref 6–24)
Bilirubin Total: 0.2 mg/dL (ref 0.0–1.2)
CO2: 23 mmol/L (ref 20–29)
Calcium: 10.3 mg/dL — ABNORMAL HIGH (ref 8.7–10.2)
Chloride: 92 mmol/L — ABNORMAL LOW (ref 96–106)
Creatinine, Ser: 0.64 mg/dL — ABNORMAL LOW (ref 0.76–1.27)
Globulin, Total: 2 g/dL (ref 1.5–4.5)
Glucose: 137 mg/dL — ABNORMAL HIGH (ref 70–99)
Potassium: 5.4 mmol/L — ABNORMAL HIGH (ref 3.5–5.2)
Sodium: 134 mmol/L (ref 134–144)
Total Protein: 7 g/dL (ref 6.0–8.5)
eGFR: 115 mL/min/{1.73_m2} (ref >59)

## 2021-11-30 ENCOUNTER — Encounter: Payer: Medicaid Other | Attending: Physical Medicine & Rehabilitation | Admitting: Physical Medicine & Rehabilitation

## 2021-12-05 ENCOUNTER — Other Ambulatory Visit: Payer: Self-pay | Admitting: Nurse Practitioner

## 2021-12-05 DIAGNOSIS — G8929 Other chronic pain: Secondary | ICD-10-CM

## 2021-12-19 ENCOUNTER — Ambulatory Visit
Admission: RE | Admit: 2021-12-19 | Discharge: 2021-12-19 | Disposition: A | Discharge status: Home / Self Care | Payer: Medicaid Other | Source: Ambulatory Visit | Attending: Nurse Practitioner | Admitting: Nurse Practitioner

## 2021-12-19 DIAGNOSIS — J449 Chronic obstructive pulmonary disease, unspecified: Secondary | ICD-10-CM

## 2021-12-19 DIAGNOSIS — F17200 Nicotine dependence, unspecified, uncomplicated: Secondary | ICD-10-CM

## 2022-01-06 IMAGING — MR MR ABDOMEN WO/W CM
10 of 11 series · 43 of 48 positions shown · IV contrast (Gadavist)
Comparison: Abdomen and pelvis CT February 24, 2020

CLINICAL DATA: Neoplasm of the LEFT kidney

EXAM:
MRI ABDOMEN WITHOUT AND WITH CONTRAST
TECHNIQUE: Multiplanar multisequence MR imaging of the abdomen was performed
both before and after the administration of intravenous contrast.
CONTRAST:  10mL GADAVIST GADOBUTROL 1 MMOL/ML IV SOLN

[Series 6: T2 fat-sat · axial · 6.0mm · 1.25mm/px · 1 of 42 slices shown]
[im 1/42]
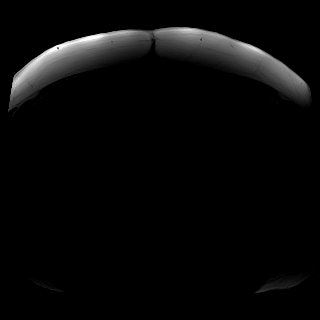

[Series 8: t1_vibe_fs_tra_p4_bh_pre · axial · 3.0mm · 1.31mm/px · z∈[-273,-12]mm · 4 of 88 slices shown]
[im 1/88]
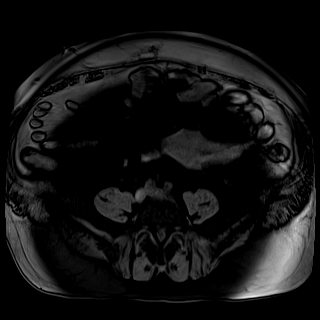
[im 30/88]
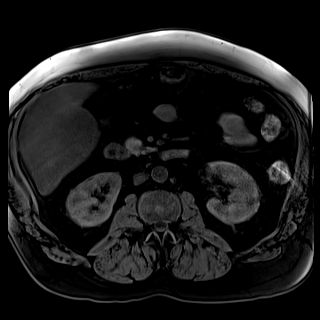
[im 59/88]
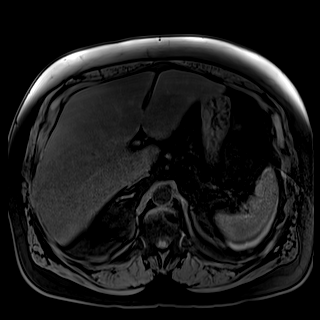
[im 88/88]
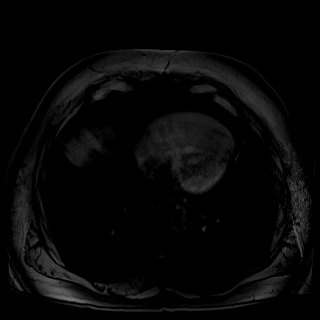

[Series 10: t1_vibe_fs_tra_p4_bh_post · axial · 3.0mm · 1.31mm/px · z∈[-273,-12]mm · 4 of 88 slices shown (1 of 4)]
[im 1/88]
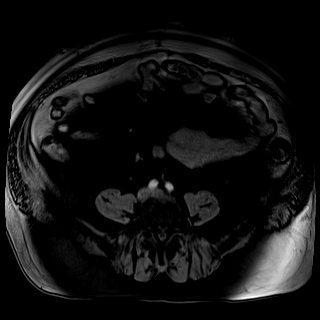
[im 30/88]
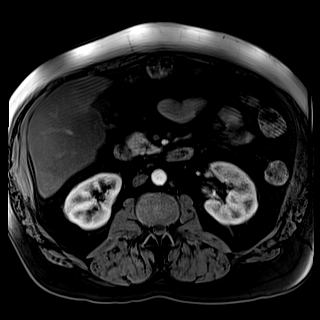
[im 59/88]
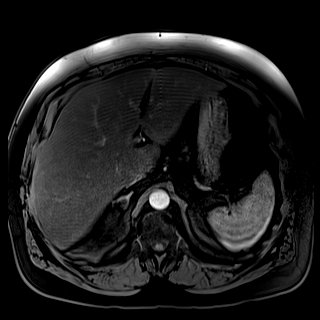
[im 88/88]
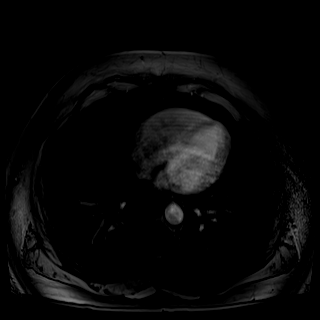

[Series 11: t1_vibe_fs_tra_p4_bh_post_sub · axial · 3.0mm · 1.31mm/px · z∈[-273,-12]mm · 4 of 88 slices shown (1 of 4)]
[im 1/88]
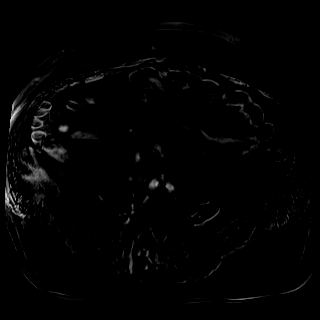
[im 30/88]
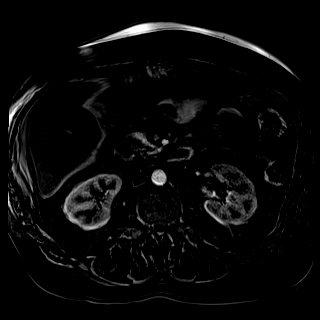
[im 59/88]
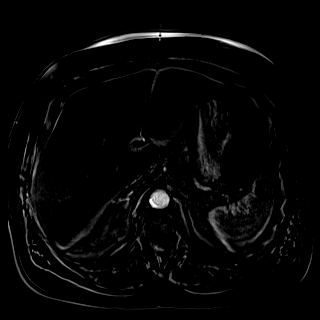
[im 88/88]
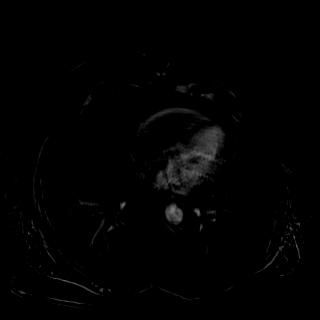

[Series 12: t1_vibe_fs_tra_p4_bh_post · axial · 3.0mm · 1.31mm/px · z∈[-273,-12]mm · 5 of 88 slices shown (2 of 4)]
[im 1/88]
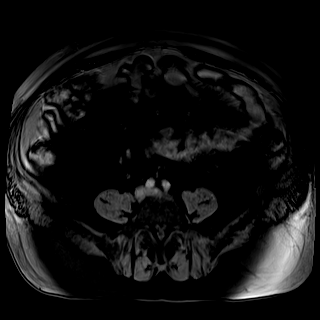
[im 22/88]
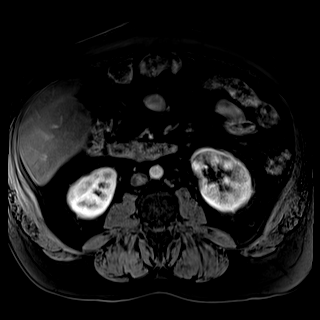
[im 44/88]
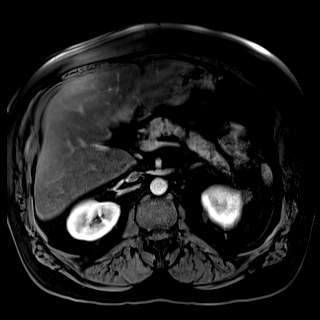
[im 66/88]
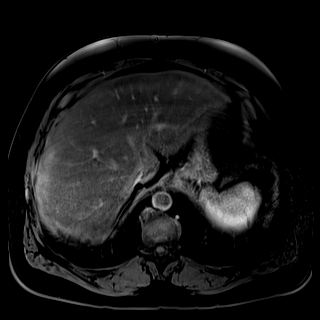
[im 88/88]
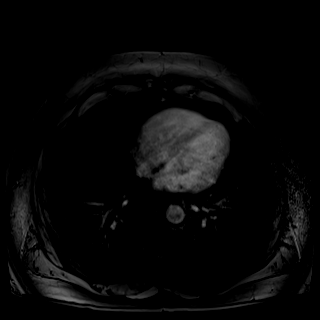

[Series 13: t1_vibe_fs_tra_p4_bh_post_sub · axial · 3.0mm · 1.31mm/px · z∈[-273,-12]mm · 5 of 88 slices shown (2 of 4)]
[im 1/88]
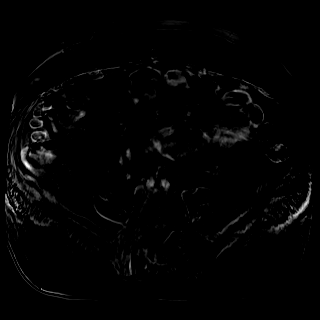
[im 22/88]
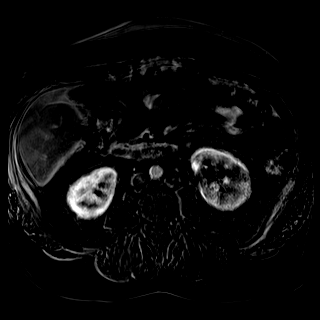
[im 44/88]
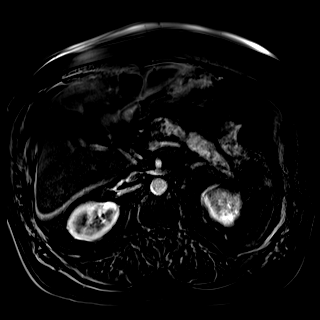
[im 66/88]
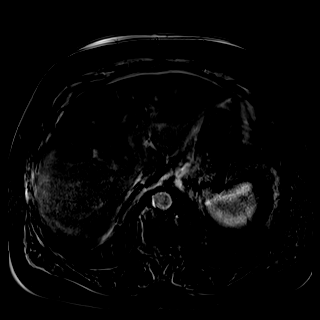
[im 88/88]
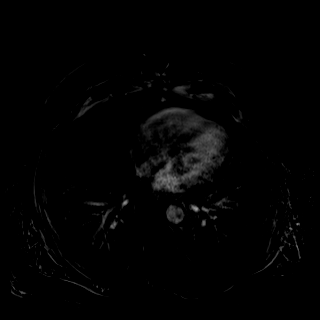

[Series 14: t1_vibe_fs_tra_p4_bh_post · axial · 3.0mm · 1.31mm/px · z∈[-273,-12]mm · 5 of 88 slices shown (3 of 4)]
[im 1/88]
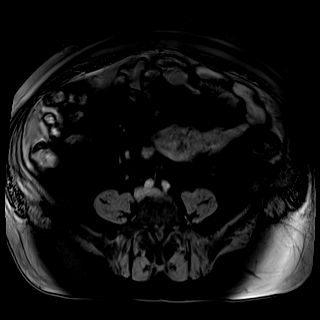
[im 22/88]
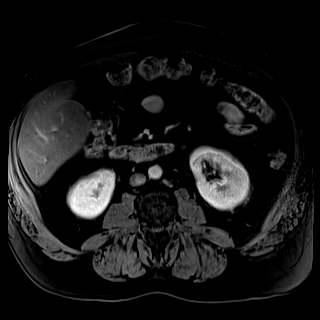
[im 44/88]
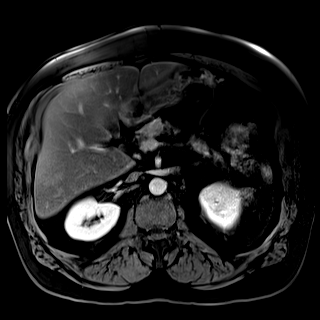
[im 66/88]
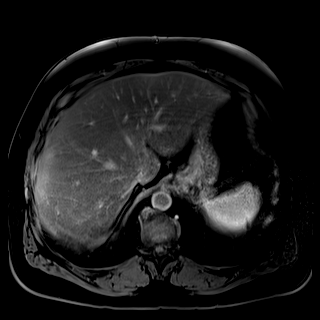
[im 88/88]
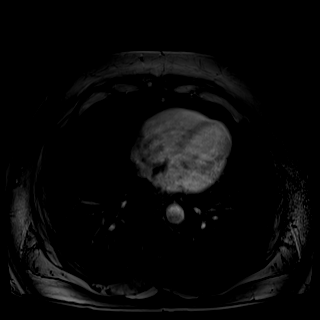

[Series 15: t1_vibe_fs_tra_p4_bh_post_sub · axial · 3.0mm · 1.31mm/px · z∈[-273,-12]mm · 5 of 88 slices shown (3 of 4)]
[im 1/88]
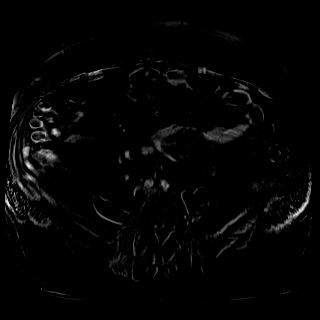
[im 22/88]
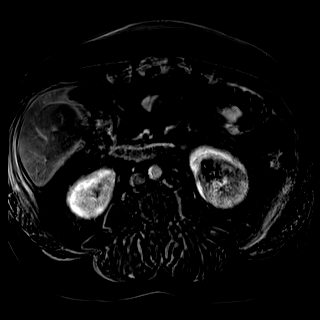
[im 44/88]
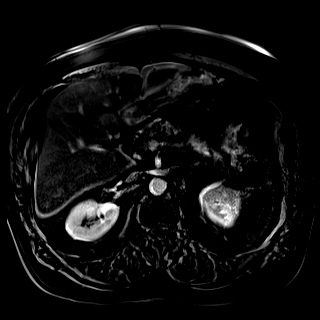
[im 66/88]
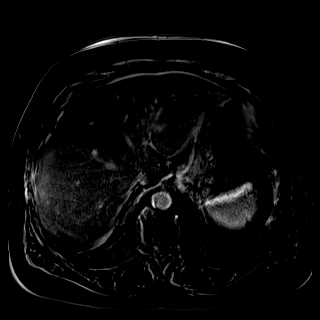
[im 88/88]
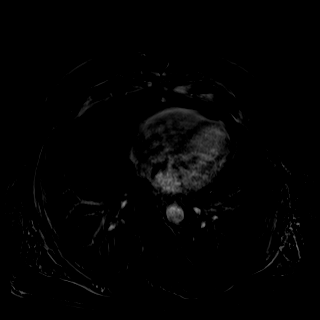

[Series 16: t1_vibe_fs_tra_p4_bh_post · axial · 3.0mm · 1.31mm/px · z∈[-273,-12]mm · 5 of 88 slices shown (4 of 4)]
[im 1/88]
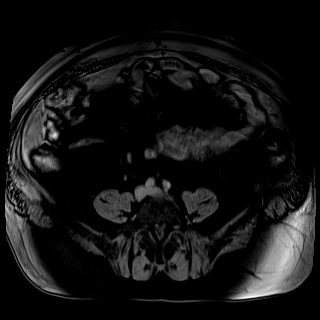
[im 22/88]
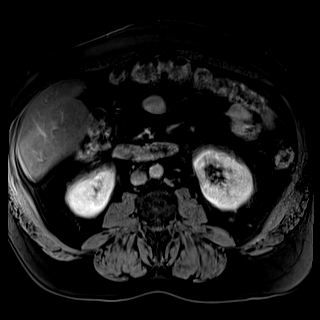
[im 44/88]
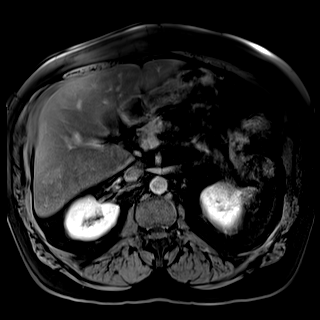
[im 66/88]
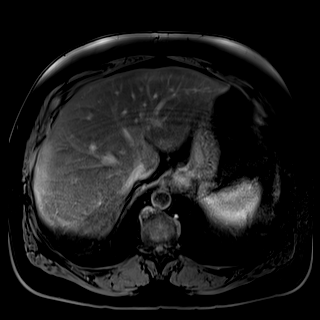
[im 88/88]
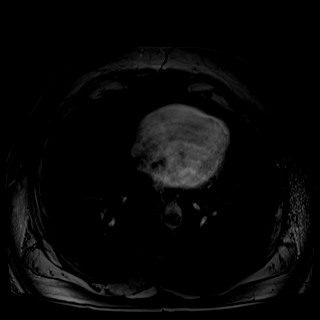

[Series 17: t1_vibe_fs_tra_p4_bh_post_sub · axial · 3.0mm · 1.31mm/px · z∈[-273,-12]mm · 5 of 88 slices shown (4 of 4)]
[im 1/88]
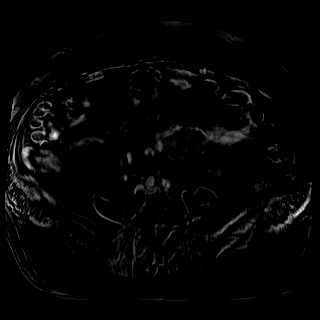
[im 22/88]
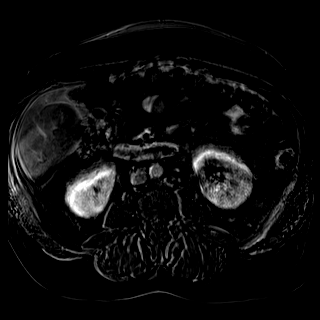
[im 44/88]
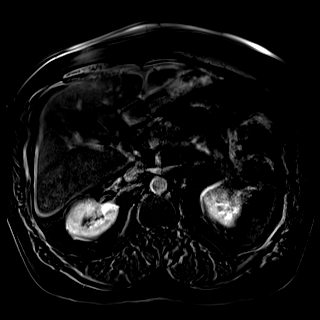
[im 66/88]
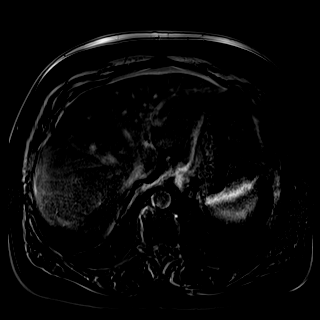
[im 88/88]
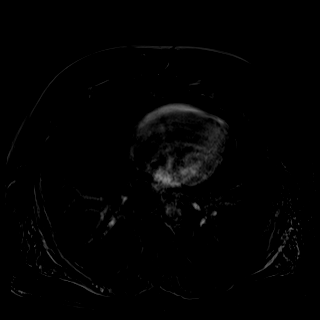

[43 of 48 positions shown; findings below may reference images not displayed]

FINDINGS: Lower chest: Incidental imaging of the lung bases without
consolidation or sign of effusion.

Hepatobiliary: Severe hepatic steatosis. No focal, suspicious
hepatic lesion. Hepatomegaly 23 cm greatest craniocaudal extent. No
pericholecystic stranding. No biliary duct distension.

Pancreas: Pancreas without ductal dilation or sign of inflammation.
No focal pancreatic lesion. Normal intrinsic T1 signal of pancreatic
parenchyma.

Spleen:  Spleen with normal size and contour, no focal lesion.

Adrenals/Urinary Tract: Nodular thickening of the RIGHT adrenal
gland shows greater than 50% signal loss compatible with lipid rich
lesion, findings not as specific for adrenal adenoma in the context
of possible renal cell carcinoma but are on the contralateral side
and is well-circumscribed and homogeneous with respect to
enhancement making adenoma more likely.

The small renal lesion is unfortunately not well assessed on the
current study. There is some contour irregularity along the lateral
margin of the RIGHT kidney adjacent to a small cyst this area is
best seen actually on coronal single shot images where there is a 9
mm area of very subtle hypointensity bulging the cortex of the RIGHT
kidney. Subtraction images suggest that this contour abnormality
does enhance but is not ovoid or rounded as is the dominant LEFT
renal lesion that measures approximately 3.5 x 2.8 cm.

Stomach/Bowel: No acute gastrointestinal process to the extent
evaluated.

Vascular/Lymphatic: There is no gastrohepatic or hepatoduodenal
ligament lymphadenopathy. No retroperitoneal or mesenteric
lymphadenopathy. Vascular structures in the abdomen are patent. No
aneurysmal dilation of the abdominal aorta.

Other:  No ascites.

Musculoskeletal: No suspicious bone lesions identified.
IMPRESSION: 1. Likely RIGHT adrenal adenoma as discussed.
2. Motion artifact limits assessment on the current study. There is
an area of enhancement adjacent to a small RIGHT renal cyst and
contour abnormality that corresponds to the area of concern on
previous CTs. This is suspicious for, not entirely diagnostic of a
small RIGHT renal cell carcinoma. Close attention on subsequent
follow-up is suggested. There may be some baseline T1 hyperintensity
that confound subtraction images given motion on the current study.
3. Severe hepatic steatosis with hepatomegaly.

## 2022-01-14 ENCOUNTER — Other Ambulatory Visit: Payer: Self-pay | Admitting: Nurse Practitioner

## 2022-01-14 DIAGNOSIS — M1A9XX0 Chronic gout, unspecified, without tophus (tophi): Secondary | ICD-10-CM

## 2022-02-03 ENCOUNTER — Other Ambulatory Visit: Payer: Self-pay | Admitting: Nurse Practitioner

## 2022-02-03 DIAGNOSIS — E1142 Type 2 diabetes mellitus with diabetic polyneuropathy: Secondary | ICD-10-CM

## 2022-02-23 DIAGNOSIS — J9809 Other diseases of bronchus, not elsewhere classified: Secondary | ICD-10-CM | POA: Diagnosis not present

## 2022-02-23 DIAGNOSIS — J432 Centrilobular emphysema: Secondary | ICD-10-CM | POA: Diagnosis not present

## 2022-02-23 DIAGNOSIS — N289 Disorder of kidney and ureter, unspecified: Secondary | ICD-10-CM | POA: Diagnosis not present

## 2022-02-23 DIAGNOSIS — K760 Fatty (change of) liver, not elsewhere classified: Secondary | ICD-10-CM | POA: Diagnosis not present

## 2022-02-23 DIAGNOSIS — Z85528 Personal history of other malignant neoplasm of kidney: Secondary | ICD-10-CM | POA: Diagnosis not present

## 2022-03-27 DIAGNOSIS — D49511 Neoplasm of unspecified behavior of right kidney: Secondary | ICD-10-CM | POA: Diagnosis not present

## 2022-03-29 ENCOUNTER — Other Ambulatory Visit: Payer: Self-pay | Admitting: Nurse Practitioner

## 2022-03-29 DIAGNOSIS — E1142 Type 2 diabetes mellitus with diabetic polyneuropathy: Secondary | ICD-10-CM

## 2022-04-06 ENCOUNTER — Other Ambulatory Visit: Payer: Self-pay | Admitting: Family Medicine

## 2022-04-06 DIAGNOSIS — M1A9XX0 Chronic gout, unspecified, without tophus (tophi): Secondary | ICD-10-CM

## 2022-04-19 ENCOUNTER — Other Ambulatory Visit: Payer: Self-pay | Admitting: Nurse Practitioner

## 2022-04-19 DIAGNOSIS — E1142 Type 2 diabetes mellitus with diabetic polyneuropathy: Secondary | ICD-10-CM

## 2022-05-20 ENCOUNTER — Other Ambulatory Visit: Payer: Self-pay | Admitting: Family Medicine

## 2022-05-20 ENCOUNTER — Other Ambulatory Visit: Payer: Self-pay | Admitting: Nurse Practitioner

## 2022-05-20 DIAGNOSIS — I10 Essential (primary) hypertension: Secondary | ICD-10-CM

## 2022-05-20 DIAGNOSIS — E1142 Type 2 diabetes mellitus with diabetic polyneuropathy: Secondary | ICD-10-CM

## 2022-05-20 DIAGNOSIS — M1A9XX0 Chronic gout, unspecified, without tophus (tophi): Secondary | ICD-10-CM

## 2022-05-24 ENCOUNTER — Other Ambulatory Visit: Payer: Self-pay | Admitting: Nurse Practitioner

## 2022-05-24 ENCOUNTER — Other Ambulatory Visit: Payer: Self-pay | Admitting: Family Medicine

## 2022-05-24 DIAGNOSIS — E1142 Type 2 diabetes mellitus with diabetic polyneuropathy: Secondary | ICD-10-CM

## 2022-05-24 DIAGNOSIS — I10 Essential (primary) hypertension: Secondary | ICD-10-CM

## 2022-05-24 DIAGNOSIS — M1A9XX0 Chronic gout, unspecified, without tophus (tophi): Secondary | ICD-10-CM

## 2022-05-27 ENCOUNTER — Other Ambulatory Visit: Payer: Self-pay | Admitting: Family Medicine

## 2022-05-27 ENCOUNTER — Other Ambulatory Visit: Payer: Self-pay | Admitting: Nurse Practitioner

## 2022-05-27 DIAGNOSIS — E1142 Type 2 diabetes mellitus with diabetic polyneuropathy: Secondary | ICD-10-CM

## 2022-05-27 DIAGNOSIS — M1A9XX0 Chronic gout, unspecified, without tophus (tophi): Secondary | ICD-10-CM

## 2022-05-27 DIAGNOSIS — I10 Essential (primary) hypertension: Secondary | ICD-10-CM

## 2022-05-28 NOTE — Telephone Encounter (Signed)
Wife following up on refill request for  gabapentin (NEURONTIN) 300 MG capsule. Everything else was sent in, but only for 30 days.  Wife states he really needs this and took his last this morning.  IN addition, pt could not get an appt until Jan 2. Pt only given a 30 day of all his meds. Can you send in enough to get him through to his appt?  [place] ([place]), [place] - [phone]

## 2022-06-24 ENCOUNTER — Other Ambulatory Visit: Payer: Self-pay | Admitting: Family Medicine

## 2022-06-24 ENCOUNTER — Other Ambulatory Visit: Payer: Self-pay | Admitting: Nurse Practitioner

## 2022-06-24 DIAGNOSIS — M1A9XX0 Chronic gout, unspecified, without tophus (tophi): Secondary | ICD-10-CM

## 2022-06-24 DIAGNOSIS — E782 Mixed hyperlipidemia: Secondary | ICD-10-CM

## 2022-06-24 DIAGNOSIS — E1142 Type 2 diabetes mellitus with diabetic polyneuropathy: Secondary | ICD-10-CM

## 2022-06-24 DIAGNOSIS — I10 Essential (primary) hypertension: Secondary | ICD-10-CM

## 2022-06-26 NOTE — Telephone Encounter (Signed)
Requested Prescriptions  Pending Prescriptions Disp Refills   atorvastatin (LIPITOR) 40 MG tablet [Pharmacy Med Name: Atorvastatin Calcium 40 MG Oral Tablet] 90 tablet 0    Sig: TAKE 1 TABLET BY MOUTH ONCE DAILY IN THE EVENING     Cardiovascular:  Antilipid - Statins Failed - 06/24/2022 12:22 PM      Failed - Lipid Panel in normal range within the last 12 months    Cholesterol, Total  Date Value Ref Range Status  08/16/2021 282 (H) 100 - 199 mg/dL Final   LDL Chol Calc (NIH)  Date Value Ref Range Status  08/16/2021 171 (H) 0 - 99 mg/dL Final   HDL  Date Value Ref Range Status  08/16/2021 44 >39 mg/dL Final   Triglycerides  Date Value Ref Range Status  08/16/2021 346 (H) 0 - 149 mg/dL Final         Passed - Patient is not pregnant      Passed - Valid encounter within last 12 months    Recent Outpatient Visits           7 months ago Primary hypertension   [place], [place] W, NP   10 months ago Type 2 diabetes mellitus with other specified complication, without long-term current use of insulin ([place])   [place], Vernia Buff, NP   1 year ago Primary hypertension   [place], [place] W, NP   1 year ago Primary hypertension   [place], [place] W, NP   2 years ago Type 2 diabetes mellitus treated without insulin ([place])   [place], Vernia Buff, NP       Future Appointments             In 1 month Gildardo Pounds, NP [place]             gabapentin (NEURONTIN) 300 MG capsule [Pharmacy Med Name: Gabapentin 300 MG Oral Capsule] 180 capsule 0    Sig: TAKE 3 CAPSULES BY MOUTH TWICE DAILY     Neurology: Anticonvulsants - gabapentin Failed - 06/24/2022 12:22 PM      Failed - Cr in normal range and within 360 days    Creat  Date Value Ref Range Status   04/26/2016 0.77 0.60 - 1.35 mg/dL Final   Creatinine, Ser  Date Value Ref Range Status  11/15/2021 0.64 (L) 0.76 - 1.27 mg/dL Final   Creatinine,U  Date Value Ref Range Status  08/05/2013 138.5 mg/dL Final    Comment:      Cutoff Values for Urine Drug Screen:         Drug Class           Cutoff (ng/mL)         Amphetamines            1000         Barbiturates             200         Cocaine Metabolites      300         Benzodiazepines          200         Methadone                300  Opiates                 2000         Phencyclidine             25         Propoxyphene             300         Marijuana Metabolites     50   For medical purposes only.         Failed - Completed PHQ-2 or PHQ-9 in the last 360 days      Passed - Valid encounter within last 12 months    Recent Outpatient Visits           7 months ago Primary hypertension   [place], [place] W, NP   10 months ago Type 2 diabetes mellitus with other specified complication, without long-term current use of insulin [facility])   [place], Vernia Buff, NP   1 year ago Primary hypertension   [place], Vernia Buff, NP   1 year ago Primary hypertension   [place], [place] W, NP   2 years ago Type 2 diabetes mellitus treated without insulin [facility])   [place], Zelda W, NP       Future Appointments             In 1 month Gildardo Pounds, NP [place]

## 2022-07-15 ENCOUNTER — Other Ambulatory Visit: Payer: Self-pay | Admitting: Nurse Practitioner

## 2022-07-15 DIAGNOSIS — I10 Essential (primary) hypertension: Secondary | ICD-10-CM

## 2022-07-24 ENCOUNTER — Other Ambulatory Visit: Payer: Self-pay | Admitting: Nurse Practitioner

## 2022-07-24 DIAGNOSIS — E1142 Type 2 diabetes mellitus with diabetic polyneuropathy: Secondary | ICD-10-CM

## 2022-07-31 ENCOUNTER — Ambulatory Visit: Payer: Medicaid Other | Attending: Nurse Practitioner | Admitting: Nurse Practitioner

## 2022-07-31 ENCOUNTER — Encounter: Payer: Self-pay | Admitting: Nurse Practitioner

## 2022-07-31 VITALS — BP 152/88 | HR 80 | Ht 74.0 in | Wt 276.8 lb

## 2022-07-31 DIAGNOSIS — D72829 Elevated white blood cell count, unspecified: Secondary | ICD-10-CM

## 2022-07-31 DIAGNOSIS — E785 Hyperlipidemia, unspecified: Secondary | ICD-10-CM

## 2022-07-31 DIAGNOSIS — K219 Gastro-esophageal reflux disease without esophagitis: Secondary | ICD-10-CM | POA: Diagnosis not present

## 2022-07-31 DIAGNOSIS — E1169 Type 2 diabetes mellitus with other specified complication: Secondary | ICD-10-CM

## 2022-07-31 DIAGNOSIS — J449 Chronic obstructive pulmonary disease, unspecified: Secondary | ICD-10-CM

## 2022-07-31 DIAGNOSIS — G4733 Obstructive sleep apnea (adult) (pediatric): Secondary | ICD-10-CM | POA: Diagnosis not present

## 2022-07-31 DIAGNOSIS — I10 Essential (primary) hypertension: Secondary | ICD-10-CM | POA: Diagnosis not present

## 2022-07-31 LAB — POCT GLYCOSYLATED HEMOGLOBIN (HGB A1C): HbA1c, POC (controlled diabetic range): 6.8 % (ref 0.0–7.0)

## 2022-07-31 MED ORDER — BLOOD PRESSURE MONITOR DEVI: 0 refills | Status: DC

## 2022-07-31 MED ORDER — OMEPRAZOLE 40 MG PO CPDR: 40.0000 mg | DELAYED_RELEASE_CAPSULE | Freq: Every day | ORAL | 1 refills | Status: DC

## 2022-07-31 MED ORDER — PREDNISONE 20 MG PO TABS: 40.0000 mg | ORAL_TABLET | Freq: Every day | ORAL | 0 refills | Status: AC

## 2022-07-31 MED ORDER — PREDNISONE 20 MG PO TABS: 40.0000 mg | ORAL_TABLET | Freq: Every day | ORAL | 0 refills | Status: DC

## 2022-07-31 MED ORDER — VALSARTAN 40 MG PO TABS: 40.0000 mg | ORAL_TABLET | Freq: Every day | ORAL | 3 refills | Status: DC

## 2022-07-31 NOTE — Progress Notes (Signed)
Assessment & Plan:  Vere was seen today for diabetes.  Diagnoses and all orders for this visit:  Type 2 diabetes mellitus with other specified complication, without long-term current use of insulin (HCC) -     POCT glycosylated hemoglobin (Hb A1C) -     CMP14+EGFR -     Ambulatory referral to Podiatry Continue blood sugar control as discussed in office today, low carbohydrate diet, and regular physical exercise as tolerated, 150 minutes per week (30 min each day, 5 days per week, or 50 min 3 days per week). Keep blood sugar logs with fasting goal of 90-130 mg/dl, post prandial (after you eat) less than 180.  For Hypoglycemia: BS <60 and Hyperglycemia BS >400; contact the clinic ASAP. Annual eye exams and foot exams are recommended.   Primary hypertension -     valsartan (DIOVAN) 40 MG tablet; Take 1 tablet (40 mg total) by mouth daily. -     CMP14+EGFR -     Blood Pressure Monitor DEVI; Please provide patient with insurance approved blood pressure monitor I10.0 Continue all antihypertensives as prescribed.  Reminded to bring in blood pressure log for follow  up appointment.  RECOMMENDATIONS: DASH/Mediterranean Diets are healthier choices for HTN.    COPD mixed type ([place]) -     Ambulatory referral to Pulmonology -     predniSONE (DELTASONE) 20 MG tablet; Take 2 tablets (40 mg total) by mouth daily with breakfast for 5 days.  OSA (obstructive sleep apnea) -     Split night study; Future  GERD without esophagitis -     omeprazole (PRILOSEC) 40 MG capsule; Take 1 capsule (40 mg total) by mouth daily. INSTRUCTIONS: Avoid GERD Triggers: acidic, spicy or fried foods, caffeine, coffee, sodas,  alcohol and chocolate.    Leukocytosis, unspecified type -     CBC with Differential  Dyslipidemia, goal LDL below 70 -     Lipid panel INSTRUCTIONS: Work on a low fat, heart healthy diet and participate in regular aerobic exercise program by working out at least 150 minutes per week; 5 days  a week-30 minutes per day. Avoid red meat/beef/steak,  fried foods. junk foods, sodas, sugary drinks, unhealthy snacking, alcohol and smoking.  Drink at least 80 oz of water per day and monitor your carbohydrate intake daily.      Patient has been counseled on age-appropriate routine health concerns for screening and prevention. These are reviewed and up-to-date. Referrals have been placed accordingly. Immunizations are up-to-date or declined Subjective:   Chief Complaint  Patient presents with   Diabetes   HPI [place] 52 y.o. male presents to office today for follow up to DM and HTN He is accompanied by his significant other today.   He has a past medical history of Anxiety, Asthma,  CHF  Chronic lower back pain, COPD, GERD, Gout, History of Clostridium difficile infection (2014), Hypercholesterolemia, Hypertension, DM, Osteoarthritis, Panic attacks, and Shoulder pain, right (04/18/2017).     DM Diabetes is well controlled today. He is taking metformin 1000 mg BID. He takes gabapentin and cymbalta daily as prescribed for peripheral neuropathy. Podiatry referral was placed today for foot care.  Lab Results  Component Value Date   HGBA1C 6.8 07/31/2022      HTN Blood pressure continues elevated. Will switch lisinopril to valsartan 40 mg daily and he will continue lopressor 50 mg BID, amlodipine 10 mg daily. He also endorses symptoms of OSA. Sleep study 7 years ago was normal. Will need  to reorder due to risk factors A blood pressure monitoring device was sent to the pharmacy for him to pick up BP Readings from Last 3 Encounters:  07/31/22 (!) 152/88  11/15/21 (!) 148/91  08/16/21 (!) 146/101    COPD Continues smoking 1 ppd. Endorses shortness of breath with minimal activity. Using albuterol inhaler a few times per day, nebs a few times per week in addition to his advair daily. Has not seen pulmonology in several years. Wheezing on exam today.  Sleep Apnea: Patient  presents with possible obstructive sleep apnea. Patent has a several years history of symptoms of daytime fatigue, morning fatigue, hypertension, and obesity. Patient generally gets a few hours of sleep per night, and states they generally have nightime awakenings and difficulty falling back asleep if awakened. Snoring of moderate to severe severity is present. Apneic episodes are present.   GERD Does not feel famotidine is effective. Notes coughing and regurgitation at night when lying down.   Review of Systems  Constitutional:  Negative for fever, malaise/fatigue and weight loss.  HENT: Negative.  Negative for nosebleeds.   Eyes: Negative.  Negative for blurred vision, double vision and photophobia.  Respiratory:  Positive for cough, sputum production, shortness of breath and wheezing.   Cardiovascular: Negative.  Negative for chest pain, palpitations and leg swelling.  Gastrointestinal:  Positive for heartburn. Negative for nausea and vomiting.  Musculoskeletal: Negative.  Negative for myalgias.  Neurological:  Positive for tingling and sensory change. Negative for dizziness, focal weakness, seizures and headaches.  Psychiatric/Behavioral: Negative.  Negative for suicidal ideas.     Past Medical History:  Diagnosis Date   Anxiety    Asthma    daily inhaler   Cancer ([place])    pt. reports diagnosed with skin cancer on face/uncertain type but he reports may be basal cell, pending surgery to remove 09/03/18   CHF (congestive heart failure) ([place])    no cardiologist   Chronic cough    states due to COPD   Chronic lower back pain    COPD (chronic obstructive pulmonary disease) ([place])    no home O2   Dyspnea    with humidity and high pollen count   Edentulous    upper gum   GERD (gastroesophageal reflux disease)    Gout    History of Clostridium difficile infection 2014   Hypercholesterolemia    Hypertension    states under control with med., has been on med. x 4 yr.   Insulin  dependent diabetes mellitus    type 2    Osteoarthritis    bilat. hip   Panic attacks    Shoulder pain, right 04/18/2017    Past Surgical History:  Procedure Laterality Date   COLON SURGERY     14 inches of colon removed   COLONOSCOPY WITH PROPOFOL N/A 04/01/2013   Procedure: COLONOSCOPY WITH PROPOFOL;  Surgeon: Arta Silence, MD;  Location: [facility];  Service: Endoscopy;  Laterality: N/A;   FLEXIBLE SIGMOIDOSCOPY N/A 02/28/2013   Procedure: FLEXIBLE SIGMOIDOSCOPY;  Surgeon: Winfield Cunas., MD;  Location[facility];  Service: Endoscopy;  Laterality: N/A;   LAPAROSCOPIC APPENDECTOMY  07/05/2009   LAPAROSCOPIC LOW ANTERIOR RESECTION N/A 07/08/2013   Procedure: LAPAROSCOPIC LOW ANTERIOR RESECTION WITH TAKEDOWN OF SPLENIC FLEXURE.;  Surgeon: Adin Hector, MD;  Location: [facility];  Service: General;  Laterality: N/A;   MANDIBLE SURGERY Left 2010   "dry skin pocket cut out"   MULTIPLE EXTRACTIONS WITH ALVEOLOPLASTY N/A 04/24/2017  Procedure: SURGICAL REMOVAL OF TEETH NUMBERS `7 20-29 AND 31;  Surgeon: Michael Litter, DMD;  Location: [facility];  Service: Oral Surgery;  Laterality: N/A;   ROBOTIC ASSITED PARTIAL NEPHRECTOMY Left 05/02/2020   Procedure: XI ROBOTIC ASSITED LAPAROSCOPIC PARTIAL NEPHRECTOMY;  Surgeon: Raynelle Bring, MD;  Location: [facility];  Service: Urology;  Laterality: Left;   TOTAL HIP ARTHROPLASTY Left 06/25/2016   Procedure: TOTAL HIP ARTHROPLASTY ANTERIOR APPROACH;  Surgeon: Frederik Pear, MD;  Location: [facility];  Service: Orthopedics;  Laterality: Left;   TOTAL HIP ARTHROPLASTY Right 09/10/2016   Procedure: TOTAL HIP ARTHROPLASTY ANTERIOR APPROACH;  Surgeon: Frederik Pear, MD;  Location: [facility];  Service: Orthopedics;  Laterality: Right;    Family History  Problem Relation Age of Onset   Cancer Father        lymphoma   Heart attack Mother     Social History Reviewed with no changes to be made today.   Outpatient Medications Prior to Visit   Medication Sig Dispense Refill   albuterol (PROVENTIL) (2.5 MG/3ML) 0.083% nebulizer solution Take 3 mLs (2.5 mg total) by nebulization every 6 (six) hours as needed for wheezing or shortness of breath. 150 mL 1   albuterol (VENTOLIN HFA) 108 (90 Base) MCG/ACT inhaler Inhale 1-2 puffs into the lungs every 6 (six) hours as needed for wheezing or shortness of breath. 18 g 3   allopurinol (ZYLOPRIM) 300 MG tablet Take 1 tablet by mouth once daily 35 tablet 0   amLODipine (NORVASC) 10 MG tablet Take 1 tablet (10 mg total) by mouth at bedtime. 90 tablet 1   atorvastatin (LIPITOR) 40 MG tablet TAKE 1 TABLET BY MOUTH ONCE DAILY IN THE EVENING 90 tablet 0   Blood Glucose Monitoring Suppl (ACCU-CHEK AVIVA PLUS) w/Device KIT 1 each by Does not apply route 3 (three) times daily. 1 kit 0   calcium carbonate (TUMS EX) 750 MG chewable tablet Chew 1 tablet by mouth 3 (three) times daily as needed for heartburn.     Colchicine (MITIGARE) 0.6 MG CAPS Take 0.6 mg by mouth daily as needed. M10.9 30 capsule 3   DULoxetine (CYMBALTA) 60 MG capsule Take 1 capsule by mouth once daily 35 capsule 0   fluticasone-salmeterol (ADVAIR HFA) 230-21 MCG/ACT inhaler Inhale 2 puffs into the lungs 2 (two) times daily. 1 each 12   gabapentin (NEURONTIN) 300 MG capsule TAKE 3 CAPSULES BY MOUTH TWICE DAILY 180 capsule 0   glucose blood (ACCU-CHEK AVIVA PLUS) test strip Use as instructed for 3 times daily testing of blood sugar. E11.9 300 each 2   hydrOXYzine (ATARAX/VISTARIL) 50 MG tablet Take 1 tablet (50 mg total) by mouth 3 (three) times daily as needed. for anxiety 90 tablet 1   metFORMIN (GLUCOPHAGE) 500 MG tablet Take 2 tablets (1,000 mg total) by mouth 2 (two) times daily with a meal. 360 tablet 1   metoprolol tartrate (LOPRESSOR) 50 MG tablet Take 1 tablet by mouth twice daily 60 tablet 0   lisinopril (ZESTRIL) 40 MG tablet TAKE 1 TABLET BY MOUTH ONCE DAILY . APPOINTMENT REQUIRED FOR FUTURE REFILLS 35 tablet 0   famotidine  (PEPCID) 20 MG tablet Take 1 tablet (20 mg total) by mouth daily. 90 tablet 3   No facility-administered medications prior to visit.    Allergies  Allergen Reactions   Morphine Itching and Nausea And Vomiting   Adhesive [Tape] Itching and Rash       Objective:    BP (!) 152/88  Pulse 80   Ht _0  (1.88 m)   Wt 276 lb 12.8 oz (125.6 kg)   SpO2 96%   BMI 35.54 kg/m  Wt Readings from Last 3 Encounters:  07/31/22 276 lb 12.8 oz (125.6 kg)  11/15/21 (!) 302 lb 9.6 oz (137.3 kg)  08/16/21 297 lb 3.2 oz (134.8 kg)    Physical Exam Vitals and nursing note reviewed.  Constitutional:      Appearance: He is well-developed.  HENT:     Head: Normocephalic and atraumatic.  Cardiovascular:     Rate and Rhythm: Normal rate and regular rhythm.     Heart sounds: Normal heart sounds. No murmur heard.    No friction rub. No gallop.  Pulmonary:     Effort: Pulmonary effort is normal. No tachypnea or respiratory distress.     Breath sounds: Normal breath sounds. No decreased breath sounds, wheezing, rhonchi or rales.  Chest:     Chest wall: No tenderness.  Abdominal:     General: Bowel sounds are normal.     Palpations: Abdomen is soft.  Musculoskeletal:        General: Normal range of motion.     Cervical back: Normal range of motion.  Skin:    General: Skin is warm and dry.  Neurological:     Mental Status: He is alert and oriented to person, place, and time.     Coordination: Coordination normal.  Psychiatric:        Behavior: Behavior normal. Behavior is cooperative.        Thought Content: Thought content normal.        Judgment: Judgment normal.          Patient has been counseled extensively about nutrition and exercise as well as the importance of adherence with medications and regular follow-up. The patient was given clear instructions to go to ER or return to medical center if symptoms don't improve, worsen or new problems develop. The patient verbalized  understanding.   Follow-up: Return for 3 weeks BP check double book 1110 .   Gildardo Pounds, FNP-BC [facility] [place], [place]   07/31/2022, 6:01 PM

## 2022-07-31 NOTE — Patient Instructions (Addendum)
[facility] Address: [address], [place], [place] [address] Phone: [phone]

## 2022-08-01 LAB — LIPID PANEL
Chol/HDL Ratio: 2.9 ratio (ref 0.0–5.0)
Cholesterol, Total: 137 mg/dL (ref 100–199)
HDL: 48 mg/dL (ref >39)
LDL Chol Calc (NIH): 64 mg/dL (ref 0–99)
Triglycerides: 145 mg/dL (ref 0–149)
VLDL Cholesterol Cal: 25 mg/dL (ref 5–40)

## 2022-08-01 LAB — CMP14+EGFR
ALT: 58 IU/L — ABNORMAL HIGH (ref 0–44)
AST: 24 IU/L (ref 0–40)
Albumin/Globulin Ratio: 2.5 — ABNORMAL HIGH (ref 1.2–2.2)
Albumin: 5 g/dL (ref 4.1–5.1)
Alkaline Phosphatase: 83 IU/L (ref 44–121)
BUN/Creatinine Ratio: 6 — ABNORMAL LOW (ref 9–20)
BUN: 4 mg/dL — ABNORMAL LOW (ref 6–24)
Bilirubin Total: 0.4 mg/dL (ref 0.0–1.2)
CO2: 24 mmol/L (ref 20–29)
Calcium: 10.2 mg/dL (ref 8.7–10.2)
Chloride: 95 mmol/L — ABNORMAL LOW (ref 96–106)
Creatinine, Ser: 0.7 mg/dL — ABNORMAL LOW (ref 0.76–1.27)
Globulin, Total: 2 g/dL (ref 1.5–4.5)
Glucose: 102 mg/dL — ABNORMAL HIGH (ref 70–99)
Potassium: 5.2 mmol/L (ref 3.5–5.2)
Sodium: 133 mmol/L — ABNORMAL LOW (ref 134–144)
Total Protein: 7 g/dL (ref 6.0–8.5)
eGFR: 112 mL/min/{1.73_m2} (ref >59)

## 2022-08-01 LAB — CBC WITH DIFFERENTIAL/PLATELET
Basophils Absolute: 0.1 10*3/uL (ref 0.0–0.2)
Basos: 1 %
EOS (ABSOLUTE): 0.2 10*3/uL (ref 0.0–0.4)
Eos: 2 %
Hematocrit: 47.2 % (ref 37.5–51.0)
Hemoglobin: 16.5 g/dL (ref 13.0–17.7)
Immature Grans (Abs): 0 10*3/uL (ref 0.0–0.1)
Immature Granulocytes: 0 %
Lymphocytes Absolute: 2 10*3/uL (ref 0.7–3.1)
Lymphs: 17 %
MCH: 31.2 pg (ref 26.6–33.0)
MCHC: 35 g/dL (ref 31.5–35.7)
MCV: 89 fL (ref 79–97)
Monocytes Absolute: 0.9 10*3/uL (ref 0.1–0.9)
Monocytes: 8 %
Neutrophils Absolute: 8.4 10*3/uL — ABNORMAL HIGH (ref 1.4–7.0)
Neutrophils: 72 %
Platelets: 339 10*3/uL (ref 150–450)
RBC: 5.29 x10E6/uL (ref 4.14–5.80)
RDW: 12.2 % (ref 11.6–15.4)
WBC: 11.7 10*3/uL — ABNORMAL HIGH (ref 3.4–10.8)

## 2022-08-09 ENCOUNTER — Ambulatory Visit (INDEPENDENT_AMBULATORY_CARE_PROVIDER_SITE_OTHER): Payer: Medicaid Other | Admitting: Podiatry

## 2022-08-09 DIAGNOSIS — M79675 Pain in left toe(s): Secondary | ICD-10-CM | POA: Diagnosis not present

## 2022-08-09 DIAGNOSIS — M2141 Flat foot [pes planus] (acquired), right foot: Secondary | ICD-10-CM

## 2022-08-09 DIAGNOSIS — M79674 Pain in right toe(s): Secondary | ICD-10-CM | POA: Diagnosis not present

## 2022-08-09 DIAGNOSIS — B351 Tinea unguium: Secondary | ICD-10-CM

## 2022-08-09 DIAGNOSIS — E119 Type 2 diabetes mellitus without complications: Secondary | ICD-10-CM | POA: Diagnosis not present

## 2022-08-09 DIAGNOSIS — E1142 Type 2 diabetes mellitus with diabetic polyneuropathy: Secondary | ICD-10-CM

## 2022-08-09 DIAGNOSIS — M2142 Flat foot [pes planus] (acquired), left foot: Secondary | ICD-10-CM

## 2022-08-11 ENCOUNTER — Other Ambulatory Visit: Payer: Self-pay | Admitting: Family Medicine

## 2022-08-11 ENCOUNTER — Other Ambulatory Visit: Payer: Self-pay | Admitting: Nurse Practitioner

## 2022-08-11 DIAGNOSIS — E1142 Type 2 diabetes mellitus with diabetic polyneuropathy: Secondary | ICD-10-CM

## 2022-08-11 DIAGNOSIS — I10 Essential (primary) hypertension: Secondary | ICD-10-CM

## 2022-08-11 DIAGNOSIS — M1A9XX0 Chronic gout, unspecified, without tophus (tophi): Secondary | ICD-10-CM

## 2022-08-13 NOTE — Telephone Encounter (Signed)
Requested Prescriptions  Pending Prescriptions Disp Refills   gabapentin (NEURONTIN) 300 MG capsule [Pharmacy Med Name: Gabapentin 300 MG Oral Capsule] 540 capsule 1    Sig: TAKE 3 CAPSULES BY MOUTH TWICE DAILY     Neurology: Anticonvulsants - gabapentin Failed - 08/11/2022 10:17 AM      Failed - Cr in normal range and within 360 days    Creat  Date Value Ref Range Status  04/26/2016 0.77 0.60 - 1.35 mg/dL Final   Creatinine, Ser  Date Value Ref Range Status  07/31/2022 0.70 (L) 0.76 - 1.27 mg/dL Final   Creatinine,U  Date Value Ref Range Status  08/05/2013 138.5 mg/dL Final    Comment:      Cutoff Values for Urine Drug Screen:         Drug Class           Cutoff (ng/mL)         Amphetamines            1000         Barbiturates             200         Cocaine Metabolites      300         Benzodiazepines          200         Methadone                300         Opiates                 2000         Phencyclidine             25         Propoxyphene             300         Marijuana Metabolites     50   For medical purposes only.         Passed - Completed PHQ-2 or PHQ-9 in the last 360 days      Passed - Valid encounter within last 12 months    Recent Outpatient Visits           1 week ago Type 2 diabetes mellitus with other specified complication, without long-term current use of insulin [facility])   [place], Vernia Buff, NP   9 months ago Primary hypertension   [place], [place] W, NP   12 months ago Type 2 diabetes mellitus with other specified complication, without long-term current use of insulin [facility])   [place], Vernia Buff, NP   1 year ago Primary hypertension   [place], Vernia Buff, NP   1 year ago Primary hypertension   [place], Vernia Buff, NP       Future Appointments              In 2 weeks Tresa Endo, Melissa             DULoxetine (CYMBALTA) 60 MG capsule [Pharmacy Med Name: DULoxetine HCl 60 MG Oral Capsule Delayed Release Particles] 90 capsule 1    Sig: Take 1 capsule by mouth once daily     Psychiatry: Antidepressants - SNRI -  duloxetine Failed - 08/11/2022 10:17 AM      Failed - Cr in normal range and within 360 days    Creat  Date Value Ref Range Status  04/26/2016 0.77 0.60 - 1.35 mg/dL Final   Creatinine, Ser  Date Value Ref Range Status  07/31/2022 0.70 (L) 0.76 - 1.27 mg/dL Final   Creatinine,U  Date Value Ref Range Status  08/05/2013 138.5 mg/dL Final    Comment:      Cutoff Values for Urine Drug Screen:         Drug Class           Cutoff (ng/mL)         Amphetamines            1000         Barbiturates             200         Cocaine Metabolites      300         Benzodiazepines          200         Methadone                300         Opiates                 2000         Phencyclidine             25         Propoxyphene             300         Marijuana Metabolites     50   For medical purposes only.         Failed - Last BP in normal range    BP Readings from Last 1 Encounters:  07/31/22 (!) 152/88         Passed - eGFR is 30 or above and within 360 days    GFR, Est African American  Date Value Ref Range Status  04/26/2016 >89 >=60 mL/min Final   GFR calc Af Amer  Date Value Ref Range Status  05/03/2020 >60 >60 mL/min Final   GFR, Est Non African American  Date Value Ref Range Status  04/26/2016 >89 >=60 mL/min Final   GFR calc non Af Amer  Date Value Ref Range Status  05/04/2020 >60 >60 mL/min Final   eGFR  Date Value Ref Range Status  07/31/2022 112 >59 mL/min/1.73 Final         Passed - Completed PHQ-2 or PHQ-9 in the last 360 days      Passed - Valid encounter within last 6 months    Recent Outpatient Visits           1 week ago Type  2 diabetes mellitus with other specified complication, without long-term current use of insulin ([place])   [place], Vernia Buff, NP   9 months ago Primary hypertension   [place], [place] W, NP   12 months ago Type 2 diabetes mellitus with other specified complication, without long-term current use of insulin [facility])   [place], Vernia Buff, NP   1 year ago Primary hypertension   [place], Vernia Buff, NP   1 year ago Primary hypertension   [place], [place] W, NP       Future Appointments             In 2 weeks Daisy Blossom, Jarome [place], [place]             amLODipine (NORVASC) 10 MG tablet [Pharmacy Med Name: amLODIPine Besylate 10 MG Oral Tablet] 90 tablet 1    Sig: TAKE 1 TABLET BY MOUTH AT BEDTIME     Cardiovascular: Calcium Channel Blockers 2 Failed - 08/11/2022 10:17 AM      Failed - Last BP in normal range    BP Readings from Last 1 Encounters:  07/31/22 (!) 152/88         Passed - Last Heart Rate in normal range    Pulse Readings from Last 1 Encounters:  07/31/22 80         Passed - Valid encounter within last 6 months    Recent Outpatient Visits           1 week ago Type 2 diabetes mellitus with other specified complication, without long-term current use of insulin ([place])   [place], Vernia Buff, NP   9 months ago Primary hypertension   [place], [place] W, NP   12 months ago Type 2 diabetes mellitus with other specified complication, without long-term current use of insulin [facility])   [place], Vernia Buff, NP   1 year ago Primary hypertension   [place], Vernia Buff, NP   1 year ago Primary  hypertension   [place], Vernia Buff, NP       Future Appointments             In 2 weeks Daisy Blossom, Jarome [place], [place]

## 2022-08-13 NOTE — Progress Notes (Signed)
  Subjective:  Patient ID: Preston [name], male    DOB: April 18, 1971,  MRN: [id number]  Chief Complaint  Patient presents with   Nail Problem    Thick painful toenails   Diabetes    (np) diabetic neuropathy; nail trim/callus    52 y.o. male presents with the above complaint. History confirmed with patient.  He is taking gabapentin 900 mg twice a day.  It helps the pain but does not seem to last long enough  Objective:  Physical Exam: warm, good capillary refill, no trophic changes or ulcerative lesions, normal DP and PT pulses, and abnormal sensory exam with loss of pressure sensation. Left Foot: dystrophic yellowed discolored nail plates with subungual debris Right Foot: dystrophic yellowed discolored nail plates with subungual debris   Assessment:   1. Pain due to onychomycosis of toenails of both feet   2. Type 2 diabetes mellitus treated without insulin ([place])   3. Pes planus of both feet   4. Diabetic polyneuropathy associated with type 2 diabetes mellitus ([place])      Plan:  Patient was evaluated and treated and all questions answered.  Patient educated on diabetes. Discussed proper diabetic foot care and discussed risks and complications of disease. Educated patient in depth on reasons to return to the office immediately should he/she discover anything concerning or new on the feet. All questions answered. Discussed proper shoes as well.   We discussed his diabetic peripheral neuropathy and he is currently taking 900 mg twice a day.  Advised him he could switch to 600 milligrams 3 times a day to see if this gives more consistent coverage throughout the day  Discussed the etiology and treatment options for the condition in detail with the patient. Educated patient on the topical and oral treatment options for mycotic nails. Recommended debridement of the nails today. Sharp and mechanical debridement performed of all painful and mycotic nails today. Nails debrided in length and  thickness using a nail nipper to level of comfort. Discussed treatment options including appropriate shoe gear. Follow up as needed for painful nails.   Return in about 3 months (around 11/08/2022) for at risk diabetic foot care.

## 2022-08-23 ENCOUNTER — Encounter: Payer: Self-pay | Admitting: Internal Medicine

## 2022-08-23 ENCOUNTER — Ambulatory Visit (INDEPENDENT_AMBULATORY_CARE_PROVIDER_SITE_OTHER): Payer: Medicaid Other

## 2022-08-23 ENCOUNTER — Ambulatory Visit (INDEPENDENT_AMBULATORY_CARE_PROVIDER_SITE_OTHER): Payer: Medicaid Other | Admitting: Internal Medicine

## 2022-08-23 VITALS — BP 130/80 | HR 79 | Temp 98.1°F | Ht 74.5 in | Wt 303.0 lb

## 2022-08-23 DIAGNOSIS — J4489 Other specified chronic obstructive pulmonary disease: Secondary | ICD-10-CM

## 2022-08-23 DIAGNOSIS — M2142 Flat foot [pes planus] (acquired), left foot: Secondary | ICD-10-CM

## 2022-08-23 DIAGNOSIS — F17210 Nicotine dependence, cigarettes, uncomplicated: Secondary | ICD-10-CM | POA: Diagnosis not present

## 2022-08-23 DIAGNOSIS — M2141 Flat foot [pes planus] (acquired), right foot: Secondary | ICD-10-CM

## 2022-08-23 DIAGNOSIS — E119 Type 2 diabetes mellitus without complications: Secondary | ICD-10-CM

## 2022-08-23 LAB — CBC WITH DIFFERENTIAL/PLATELET
Basophils Absolute: 0.1 10*3/uL (ref 0.0–0.1)
Basophils Relative: 0.8 % (ref 0.0–3.0)
Eosinophils Absolute: 0.4 10*3/uL (ref 0.0–0.7)
Eosinophils Relative: 3.3 % (ref 0.0–5.0)
HCT: 48 % (ref 39.0–52.0)
Hemoglobin: 16.4 g/dL (ref 13.0–17.0)
Lymphocytes Relative: 16.8 % (ref 12.0–46.0)
Lymphs Abs: 1.9 10*3/uL (ref 0.7–4.0)
MCHC: 34.2 g/dL (ref 30.0–36.0)
MCV: 91.4 fl (ref 78.0–100.0)
Monocytes Absolute: 0.8 10*3/uL (ref 0.1–1.0)
Monocytes Relative: 7.1 % (ref 3.0–12.0)
Neutro Abs: 7.9 10*3/uL — ABNORMAL HIGH (ref 1.4–7.7)
Neutrophils Relative %: 72 % (ref 43.0–77.0)
Platelets: 360 10*3/uL (ref 150.0–400.0)
RBC: 5.25 Mil/uL (ref 4.22–5.81)
RDW: 13.1 % (ref 11.5–15.5)
WBC: 11 10*3/uL — ABNORMAL HIGH (ref 4.0–10.5)

## 2022-08-23 MED ORDER — BUDESONIDE-FORMOTEROL FUMARATE 80-4.5 MCG/ACT IN AERO: 2.0000 | INHALATION_SPRAY | Freq: Two times a day (BID) | RESPIRATORY_TRACT | 5 refills | Status: DC

## 2022-08-23 NOTE — Progress Notes (Signed)
Preston [name], male    DOB: 08/16/1970   MRN: [id number]   Brief patient profile:  41   yowm active smoker  referred to pulmonary clinic 08/23/2022 by Preston [name]  for nasal congestion/ cough / sob    x  aorund 2013     History of Present Illness  08/23/2022  Pulmonary/ 1st office eval/Preston [name]  of acei x a week  Chief Complaint  Patient presents with   Consult    COPD.  Wheezing with cough.  White mucus production.  Sinus congestion throughout the day.  Sx x 2 years.  Recurrent prednisone tx. PCP changed lisinopril to valsartan for increased BP.  Dyspnea:  more slowed by feet than breathing  Cough: pass out from cough so no longer drive  Sleep: prone/on side  SABA use: none  Improved off acei and on pred   No obvious day to day or daytime pattern/variability or assoc purulent sputum or mucus plugs or hemoptysis or cp or chest tightness,  or overt sinus or hb symptoms.   Sleeping  without nocturnal  or early am exacerbation  of respiratory  c/o's or need for noct saba. Also denies any obvious fluctuation of symptoms with weather or environmental changes or other aggravating or alleviating factors except as outlined above   No unusual exposure hx or h/o childhood pna/ asthma or knowledge of premature birth.  Current Allergies, Complete Past Medical History, Past Surgical History, Family History, and Social History were reviewed in [employer] record.  ROS  The following are not active complaints unless bolded Hoarseness, sore throat, dysphagia, dental problems, itching, sneezing,  nasal congestion or discharge of excess mucus or purulent secretions, ear ache,   fever, chills, sweats, unintended wt loss or wt gain, classically pleuritic or exertional cp,  orthopnea pnd or arm/hand swelling  or leg swelling, presyncope, palpitations, abdominal pain, anorexia, nausea, vomiting, diarrhea  or change in bowel habits or change in bladder habits, change in stools or change  in urine, dysuria, hematuria,  rash, arthralgias, visual complaints, headache, numbness, weakness or ataxia or problems with walking or coordination/neuropathy,  change in mood or  memory. syncope           Past Medical History:  Diagnosis Date   Anxiety    Asthma    daily inhaler   Cancer ([place])    pt. reports diagnosed with skin cancer on face/uncertain type but he reports may be basal cell, pending surgery to remove 09/03/18   CHF (congestive heart failure) ([place])    no cardiologist   Chronic cough    states due to COPD   Chronic lower back pain    COPD (chronic obstructive pulmonary disease) ([place])    no home O2   Dyspnea    with humidity and high pollen count   Edentulous    upper gum   GERD (gastroesophageal reflux disease)    Gout    History of Clostridium difficile infection 2014   Hypercholesterolemia    Hypertension    states under control with med., has been on med. x 4 yr.   Insulin dependent diabetes mellitus    type 2    Osteoarthritis    bilat. hip   Panic attacks    Shoulder pain, right 04/18/2017    Outpatient Medications Prior to Visit  Medication Sig Dispense Refill   albuterol (PROVENTIL) (2.5 MG/3ML) 0.083% nebulizer solution Take 3 mLs (2.5 mg total) by nebulization every 6 (six) hours as  needed for wheezing or shortness of breath. 150 mL 1   albuterol (VENTOLIN HFA) 108 (90 Base) MCG/ACT inhaler Inhale 1-2 puffs into the lungs every 6 (six) hours as needed for wheezing or shortness of breath. 18 g 3   allopurinol (ZYLOPRIM) 300 MG tablet Take 1 tablet by mouth once daily 30 tablet 0   amLODipine (NORVASC) 10 MG tablet TAKE 1 TABLET BY MOUTH AT BEDTIME 90 tablet 1   atorvastatin (LIPITOR) 40 MG tablet TAKE 1 TABLET BY MOUTH ONCE DAILY IN THE EVENING 90 tablet 0   Blood Glucose Monitoring Suppl (ACCU-CHEK AVIVA PLUS) w/Device KIT 1 each by Does not apply route 3 (three) times daily. 1 kit 0   Blood Pressure Monitor DEVI Please provide patient with insurance  approved blood pressure monitor I10.0 1 each 0   calcium carbonate (TUMS EX) 750 MG chewable tablet Chew 1 tablet by mouth 3 (three) times daily as needed for heartburn.     Colchicine (MITIGARE) 0.6 MG CAPS Take 0.6 mg by mouth daily as needed. M10.9 30 capsule 3   DULoxetine (CYMBALTA) 60 MG capsule Take 1 capsule by mouth once daily 90 capsule 1   famotidine (PEPCID) 20 MG tablet Take 20 mg by mouth daily.     fluticasone-salmeterol (ADVAIR HFA) 230-21 MCG/ACT inhaler Inhale 2 puffs into the lungs 2 (two) times daily. 1 each 12   gabapentin (NEURONTIN) 300 MG capsule TAKE 3 CAPSULES BY MOUTH TWICE DAILY 540 capsule 1   glucose blood (ACCU-CHEK AVIVA PLUS) test strip Use as instructed for 3 times daily testing of blood sugar. E11.9 300 each 2   hydrOXYzine (ATARAX/VISTARIL) 50 MG tablet Take 1 tablet (50 mg total) by mouth 3 (three) times daily as needed. for anxiety 90 tablet 1   metFORMIN (GLUCOPHAGE) 500 MG tablet Take 2 tablets (1,000 mg total) by mouth 2 (two) times daily with a meal. 360 tablet 1   metoprolol tartrate (LOPRESSOR) 50 MG tablet Take 1 tablet by mouth twice daily 60 tablet 0   omeprazole (PRILOSEC) 40 MG capsule Take 1 capsule (40 mg total) by mouth daily. 90 capsule 1   predniSONE (DELTASONE) 20 MG tablet Take 40 mg by mouth every morning. For 5 days, then stop     valsartan (DIOVAN) 40 MG tablet Take 1 tablet (40 mg total) by mouth daily. 90 tablet 3   No facility-administered medications prior to visit.     Objective:     BP 130/80 (BP Location: Left Arm, Patient Position: Sitting, Cuff Size: Large)   Pulse 79   Temp 98.1 F (36.7 C) (Oral)   Ht 6' 2.5" (1.892 m)   Wt (!) 303 lb (137.4 kg)   SpO2 96%   BMI 38.38 kg/m   SpO2: 96 % Amb mod obese wm nad   HEENT : Oropharynx  clear      Nasal turbinates nl   NECK :  without  apparent JVD/ palpable Nodes/TM    LUNGS: no acc muscle use,  Nl contour chest with minimal isnp/exp wheeze  bilaterally without  cough on insp or exp maneuvers   CV:  RRR  no s3 or murmur or increase in P2, and no edema   ABD: obese  soft and nontender with nl inspiratory excursion in the supine position. No bruits or organomegaly appreciated   MS:  Nl gait/ ext warm without deformities Or obvious joint restrictions  calf tenderness, cyanosis or clubbing    SKIN: warm and dry without lesions  NEURO:  alert, approp, nl sensorium with  no motor or cerebellar deficits apparent.       I personally reviewed images and agree with radiology impression as follows:  Chest LDSCT     12/19/21    1. Lung-RADS 2, benign appearance or behavior. Continue annual screening with low-dose chest CT without contrast in 12 months. 2. Hepatic steatosis. 3. Aortic Atherosclerosis (ICD10-I70.0) and Emphysema (ICD10-J43.9).    Assessment   Asthmatic bronchitis , chronic Active smoker - PFT's  10/13/15  FEV1 3.86 (83 % ) ratio 0.76  p 7 % improvement from saba p 0 prior to study with DLCO  33.3 (88%)   and FV curve nl and ERV 25 at wt 290    - 12/19/21 emphysema on LDSCT - 08/23/2022  After extensive coaching inhaler device,  effectiveness =    75% > try symbicort 80 2bid - Labs ordered 08/23/2022  :  allergy profile   alpha one AT phenotype    Clinically more of an AB picture than copd (despite finding of emphysema on CT) which was not present on prior pfts but still may have developed somewhat since or be at risk if keeps smoking given CT findings   Cough from AB to point of syncope is classic for ACEi related cough and agree with keeping him off and plan to reassess in 4 weeks to assure this problem has been resolved, in meantime should continue not to drive.  Strongly prefer symb 80 over advair if insurance covers as the latter is more likely to cause cough/ agree with rx for gerd/pred taper off.  F/u with pfts once cough has resolved   Cigarette smoker Counseled re importance of smoking cessation but did not meet time  criteria for separate billing            Each maintenance medication was reviewed in detail including emphasizing most importantly the difference between maintenance and prns and under what circumstances the prns are to be triggered using an action plan format where appropriate.  Total time for H and P, chart review, counseling, reviewing hfa/neb  device(s) and generating customized AVS unique to this office visit / same day charting  62 min with pt new to me          Christinia Gully, MD 08/23/2022

## 2022-08-23 NOTE — Progress Notes (Signed)
Patient presents to the office today for diabetic shoe and insole measuring.  Patient was measured with brannock device to determine size and width for 1 pair of extra depth shoes and foam casted for 3 pair of insoles.   Documentation of medical necessity will be sent to patient's treating diabetic doctor to verify and sign.   Patient's diabetic provider: Geryl Rankins, NP at [place] and insoles will be ordered at that time and patient will be notified for an appointment for fitting when they arrive.   Shoe size (per patient): 12.5 wide men's   Brannock measurement: 11.5 x-wide men's   Patient shoe selection-   Shoe choice:   [id number] Apex Boss Runner  Shoe size ordered: 12.5 x-wide men's

## 2022-08-23 NOTE — Patient Instructions (Signed)
Plan A = Automatic = Always=    Symbicort 80 Take 2 puffs first thing in am and then another 2 puffs about 12 hours later.    Work on inhaler technique:  relax and gently blow all the way out then take a nice smooth full deep breath back in, triggering the inhaler at same time you start breathing in.  Hold breath in for at least  5 seconds if you can. Blow out symbicort  thru nose. Rinse and gargle with water when done.  If mouth or throat bother you at all,  try brushing teeth/gums/tongue with arm and hammer toothpaste/ make a slurry and gargle and spit out.      Plan B = Backup (to supplement plan A, not to replace it) Only use your albuterol inhaler as a rescue medication to be used if you can't catch your breath by resting or doing a relaxed purse lip breathing pattern.  - The less you use it, the better it will work when you need it. - Ok to use the inhaler up to 2 puffs  every 4 hours if you must but call for appointment if use goes up over your usual need - Don't leave home without it !!  (think of it like the  starter your car)   Plan C = Crisis (instead of Plan B but only if Plan B stops working) - only use your albuterol nebulizer if you first try Plan B and it fails to help > ok to use the nebulizer up to every 4 hours but if start needing it regularly call for immediate appointment    Please remember to go to the lab department   for your tests - we will call you with the results when they are available.     Please schedule a follow up office visit in 4 weeks, sooner if needed  with all medications /inhalers/ solutions in hand so we can verify exactly what you are taking. This includes all medications from all doctors and over the counters

## 2022-08-24 ENCOUNTER — Encounter: Payer: Self-pay | Admitting: Internal Medicine

## 2022-08-24 DIAGNOSIS — F17210 Nicotine dependence, cigarettes, uncomplicated: Secondary | ICD-10-CM | POA: Insufficient documentation

## 2022-08-24 LAB — TIQ-MISC

## 2022-08-24 NOTE — Addendum Note (Signed)
Addended by: Suzzanne Kontos E on: 08/24/2022 08:47 AM   Modules accepted: Orders

## 2022-08-24 NOTE — Assessment & Plan Note (Addendum)
Active smoker - PFT's  10/13/15  FEV1 3.86 (83 % ) ratio 0.76  p 7 % improvement from saba p 0 prior to study with DLCO  33.3 (88%)   and FV curve nl and ERV 25 at wt 290    - 12/19/21 emphysema on LDSCT - 08/23/2022  After extensive coaching inhaler device,  effectiveness =    75% > try symbicort 80 2bid - Labs ordered 08/23/2022  :  allergy profile   alpha one AT phenotype    Clinically more of an AB picture than copd (despite finding of emphysema on CT) which was not present on prior pfts but still may have developed somewhat since or be at risk if keeps smoking given CT findings   Cough from AB to point of syncope is classic for ACEi related cough and agree with keeping him off and plan to reassess in 4 weeks to assure this problem has been resolved, in meantime should continue not to drive.  Strongly prefer symb 80 over advair if insurance covers as the latter is more likely to cause cough/ agree with rx for gerd/pred taper off.  F/u with pfts once cough has resolved

## 2022-08-24 NOTE — Assessment & Plan Note (Addendum)
Counseled re importance of smoking cessation but did not meet time criteria for separate billing            Each maintenance medication was reviewed in detail including emphasizing most importantly the difference between maintenance and prns and under what circumstances the prns are to be triggered using an action plan format where appropriate.  Total time for H and P, chart review, counseling, reviewing hfa/neb  device(s) and generating customized AVS unique to this office visit / same day charting  62 min with pt new to me

## 2022-08-30 ENCOUNTER — Ambulatory Visit: Payer: Medicaid Other | Admitting: Pharmacist

## 2022-08-30 ENCOUNTER — Other Ambulatory Visit (HOSPITAL_COMMUNITY): Payer: Self-pay

## 2022-08-30 LAB — ALPHA-1-ANTITRYPSIN PHENOTYP: A-1 Antitrypsin: 153 mg/dL (ref 101–187)

## 2022-09-02 ENCOUNTER — Encounter (HOSPITAL_BASED_OUTPATIENT_CLINIC_OR_DEPARTMENT_OTHER): Payer: Medicaid Other | Admitting: Internal Medicine

## 2022-09-09 ENCOUNTER — Other Ambulatory Visit: Payer: Self-pay | Admitting: Nurse Practitioner

## 2022-09-09 DIAGNOSIS — I10 Essential (primary) hypertension: Secondary | ICD-10-CM

## 2022-09-09 DIAGNOSIS — E1169 Type 2 diabetes mellitus with other specified complication: Secondary | ICD-10-CM

## 2022-09-09 DIAGNOSIS — M1A9XX0 Chronic gout, unspecified, without tophus (tophi): Secondary | ICD-10-CM

## 2022-09-09 NOTE — Addendum Note (Signed)
Addended bySherryle Lis, Issis Lindseth R on: 09/09/2022 03:07 PM   Modules accepted: Orders

## 2022-09-13 ENCOUNTER — Ambulatory Visit: Payer: Medicaid Other | Admitting: Pharmacist

## 2022-10-01 ENCOUNTER — Encounter: Payer: Self-pay | Admitting: Internal Medicine

## 2022-10-01 ENCOUNTER — Ambulatory Visit (INDEPENDENT_AMBULATORY_CARE_PROVIDER_SITE_OTHER): Payer: Medicaid Other | Admitting: Internal Medicine

## 2022-10-01 VITALS — BP 140/78 | HR 82 | Temp 98.2°F | Ht 72.25 in | Wt 302.8 lb

## 2022-10-01 DIAGNOSIS — D49511 Neoplasm of unspecified behavior of right kidney: Secondary | ICD-10-CM | POA: Diagnosis not present

## 2022-10-01 DIAGNOSIS — F17210 Nicotine dependence, cigarettes, uncomplicated: Secondary | ICD-10-CM | POA: Diagnosis not present

## 2022-10-01 DIAGNOSIS — I10 Essential (primary) hypertension: Secondary | ICD-10-CM | POA: Diagnosis not present

## 2022-10-01 DIAGNOSIS — J4489 Other specified chronic obstructive pulmonary disease: Secondary | ICD-10-CM

## 2022-10-01 NOTE — Patient Instructions (Signed)
Work on inhaler technique:  relax and gently blow all the way out then take a nice smooth full deep breath back in, triggering the inhaler at same time you start breathing in.  Hold breath in for at least  5 seconds if you can. Blow out symbicort  thru nose. Rinse and gargle with water when done.  If mouth or throat bother you at all,  try brushing teeth/gums/tongue with arm and hammer toothpaste/ make a slurry and gargle and spit out.   Also  Ok to try albuterol 15 min before an activity (on alternating days)  that you know would usually make you short of breath and see if it makes any difference and if makes none then don't take albuterol after activity unless you can't catch your breath as this means it's the resting that helps, not the albuterol.     Please schedule a follow up visit in 6 months but call sooner if needed

## 2022-10-01 NOTE — Assessment & Plan Note (Signed)
Off acei mid jan 2024 for cough > resolved at f/u pulmonary ov 10/01/2022   Although even in retrospect it may not be clear the ACEi contributed to the pt's symptoms,  Pt improved off them and adding them back at this point or in the future would risk confusion in interpretation of non-specific respiratory symptoms to which this patient is prone  ie  Better not to muddy the waters here.   >>> continue diovan 40 mg daily / furter tweeking per PCP    F/u q 6 m in pulmonary clinic         Each maintenance medication was reviewed in detail including emphasizing most importantly the difference between maintenance and prns and under what circumstances the prns are to be triggered using an action plan format where appropriate.  Total time for H and P, chart review, counseling, reviewing hfa/neb device(s) and generating customized AVS unique to this office visit / same day charting > 30 min for multiple  refractory respiratory  symptoms of uncertain etiology

## 2022-10-01 NOTE — Progress Notes (Signed)
Preston [name], male    DOB: 1970/11/07   MRN: [id number]   Brief patient profile:  37  yowm active smoker/MM  referred to pulmonary clinic 08/23/2022 by Preston [name]  for nasal congestion/ cough / sob    x  aorund 2013     History of Present Illness   08/23/2022  Pulmonary/ 1st office eval/Preston [name]  off acei x a week  Chief Complaint  Patient presents with   Consult    COPD.  Wheezing with cough.  White mucus production.  Sinus congestion throughout the day.  Sx x 2 years.  Recurrent prednisone tx. PCP changed lisinopril to valsartan for increased BP.  Dyspnea:  more slowed by feet than breathing  Cough: pass out from cough so no longer drive  Sleep: prone/on side  SABA use: none  Improved off acei and on pred   Rec Plan A = Automatic = Always=    Symbicort 80 Take 2 puffs first thing in am and then another 2 puffs about 12 hours later.  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 No new co's.   Dyspnea:  mb and back is 40 yards  uphill sometimes has to stop Cough: gone 1st thing in am > smoker's rattle white white  Sleeping: flat bed/ 2 pillow  SABA use: once a day hfa/ never pre challenges / no neb 02: none  Covid status:   never vax never infected  Lung cancer screening :  done     No obvious day to day or daytime variability or assoc excess/ purulent sputum or mucus plugs  or hemoptysis or cp or chest tightness, subjective wheeze or overt sinus or hb symptoms.   Sleeping  without nocturnal  or early am exacerbation  of respiratory  c/o's or need for noct saba. Also denies any obvious fluctuation of symptoms with weather or environmental changes or other aggravating or alleviating factors except as outlined above   No unusual exposure hx or h/o childhood pna/ asthma or knowledge of premature birth.  Current Allergies, Complete Past Medical History, Past Surgical History, Family History, and Social History were reviewed in [employer] record.  ROS  The following are not active complaints unless bolded Hoarseness, sore throat, dysphagia, dental problems, itching, sneezing,  nasal congestion or discharge of excess mucus or purulent secretions, ear ache,   fever, chills, sweats, unintended wt loss or wt gain, classically pleuritic or exertional cp,  orthopnea pnd or arm/hand swelling  or leg swelling, presyncope, palpitations, abdominal pain, anorexia, nausea, vomiting, diarrhea  or change in bowel habits or change in bladder habits, change in stools or change in urine, dysuria, hematuria,  rash, arthralgias, visual complaints, headache, numbness, weakness or ataxia  or problems with walking or coordination,  change in mood or  memory.        Current Meds  Medication Sig   albuterol (PROVENTIL) (2.5 MG/3ML) 0.083% nebulizer solution Take 3 mLs (2.5 mg total) by nebulization every 6 (six) hours as needed for wheezing or shortness of breath.   albuterol (VENTOLIN HFA) 108 (90 Base) MCG/ACT inhaler Inhale 1-2 puffs into the lungs every 6 (six) hours as needed for wheezing or shortness of breath.   allopurinol (ZYLOPRIM) 300 MG tablet Take 1 tablet by mouth once daily   amLODipine (NORVASC) 10 MG tablet TAKE 1 TABLET BY MOUTH AT BEDTIME   atorvastatin (LIPITOR) 40 MG tablet TAKE 1 TABLET BY MOUTH ONCE DAILY IN THE EVENING   Blood Glucose Monitoring  Suppl (ACCU-CHEK AVIVA PLUS) w/Device KIT 1 each by Does not apply route 3 (three) times daily.   Blood Pressure Monitor DEVI Please provide patient with insurance approved blood pressure monitor I10.0   budesonide-formoterol (SYMBICORT) 80-4.5 MCG/ACT inhaler Inhale 2 puffs into the lungs 2 (two) times daily.   calcium carbonate (TUMS EX) 750 MG chewable tablet Chew 1 tablet by mouth 3 (three) times daily as needed for heartburn.   Colchicine (MITIGARE) 0.6 MG CAPS Take 0.6 mg by mouth daily as needed. M10.9   DULoxetine (CYMBALTA) 60 MG capsule Take 1 capsule by mouth once daily   famotidine (PEPCID) 20 MG tablet Take 20 mg by mouth daily.   gabapentin (NEURONTIN) 300 MG capsule TAKE 3 CAPSULES BY MOUTH TWICE DAILY   glucose blood (ACCU-CHEK AVIVA PLUS) test strip Use as instructed for 3 times daily testing of blood sugar. E11.9   hydrOXYzine (ATARAX/VISTARIL) 50 MG tablet Take 1 tablet (50 mg total) by mouth 3 (three) times daily as needed. for anxiety   metFORMIN (GLUCOPHAGE) 500 MG tablet TAKE 2 TABLETS BY MOUTH TWICE DAILY WITH  A  MEAL.   metoprolol tartrate (LOPRESSOR) 50 MG tablet Take 1 tablet by mouth twice daily   omeprazole (PRILOSEC) 40 MG capsule Take 1 capsule (40 mg total) by mouth daily.   valsartan (DIOVAN) 40 MG tablet Take 1 tablet (40 mg total) by mouth daily.             Past Medical History:  Diagnosis Date   Anxiety    Asthma    daily inhaler   Cancer ([place])    pt. reports diagnosed with skin cancer on face/uncertain type but he reports may be basal cell, pending surgery to remove 09/03/18   CHF (congestive heart failure) ([place])    no cardiologist   Chronic cough    states due to COPD   Chronic lower back pain    COPD (chronic obstructive pulmonary disease) ([place])    no home O2   Dyspnea    with humidity and high pollen count   Edentulous    upper gum   GERD (gastroesophageal reflux disease)    Gout    History of Clostridium difficile infection 2014    Hypercholesterolemia    Hypertension    states under control with med., has been on med. x 4 yr.   Insulin dependent diabetes mellitus    type 2    Osteoarthritis    bilat. hip   Panic attacks    Shoulder pain, right 04/18/2017      Objective:   Wts   10/01/2022            303   08/23/22 (!) 303 lb (137.4 kg)  07/31/22 276 lb 12.8 oz (125.6 kg)  11/15/21 (!) 302 lb 9.6 oz (137.3 kg)      Vital signs reviewed  10/01/2022  - Note at rest 02 sats  97% on RA   General appearance:   MO (by BMI ) amb   minimal pseudowheeze   HEENT : Oropharynx  clear/ edentulous        NECK :  without  apparent JVD/ palpable Nodes/TM    LUNGS: no acc muscle use,  Nl contour chest which is clear to A and P bilaterally without cough on insp or exp maneuvers   CV:  RRR  no s3 or murmur or increase in P2, and no edema   ABD:  quite obese soft and nontender    MS:  Nl gait/ ext warm without deformities Or obvious joint restrictions  calf tenderness, cyanosis or clubbing    SKIN: warm and dry without lesions    NEURO:  alert, approp, nl sensorium with  no motor or cerebellar deficits apparent.           Assessment

## 2022-10-01 NOTE — Assessment & Plan Note (Signed)
Counseled re importance of smoking cessation but did not meet time criteria for separate billing   °

## 2022-10-01 NOTE — Assessment & Plan Note (Signed)
Body mass index is 40.78 kg/m.  -  no change  Lab Results  Component Value Date   TSH 1.27 09/28/2015    Contributing to doe and risk of GERD/DVT/PE >>>   reviewed the need and the process to achieve and maintain neg calorie balance > defer f/u primary care including intermittently monitoring thyroid status

## 2022-10-01 NOTE — Assessment & Plan Note (Signed)
Active smoker/MM - PFT's  10/13/15  FEV1 3.86 (83 % ) ratio 0.76  p 7 % improvement from saba p 0 prior to study with DLCO  33.3 (88%)   and FV curve nl and ERV 25% at wt 290    - 12/19/21 emphysema on LDSCT - 08/23/2022  After extensive coaching inhaler device,  effectiveness =    75% > try symbicort 80 2bid - Labs ordered 08/23/2022  :  allergy  screen  0.4   alpha one AT phenotype  MM/level 153  - 10/01/2022  After extensive coaching inhaler device,  effectiveness =    75% (baseline 25%)   No evidence of copd yet, main probleml is mild AB and MO so rec keep on symb 80 2bid (since main problem was cough and work on wt loss/ f/u q 60m

## 2022-10-04 DIAGNOSIS — J984 Other disorders of lung: Secondary | ICD-10-CM | POA: Diagnosis not present

## 2022-10-04 DIAGNOSIS — K760 Fatty (change of) liver, not elsewhere classified: Secondary | ICD-10-CM | POA: Diagnosis not present

## 2022-10-04 DIAGNOSIS — C642 Malignant neoplasm of left kidney, except renal pelvis: Secondary | ICD-10-CM | POA: Diagnosis not present

## 2022-10-17 ENCOUNTER — Other Ambulatory Visit: Payer: Self-pay | Admitting: Nurse Practitioner

## 2022-10-17 DIAGNOSIS — E1169 Type 2 diabetes mellitus with other specified complication: Secondary | ICD-10-CM

## 2022-10-17 MED ORDER — METFORMIN HCL 500 MG PO TABS: ORAL_TABLET | ORAL | 0 refills | Status: DC

## 2022-10-17 NOTE — Telephone Encounter (Signed)
Requested Prescriptions  Pending Prescriptions Disp Refills   metFORMIN (GLUCOPHAGE) 500 MG tablet 360 tablet 0    Sig: TAKE 2 TABLETS BY MOUTH TWICE DAILY WITH  A  MEAL. CALL AND SCHEDULE FOLLOW UP WITH PROVIDER     Endocrinology:  Diabetes - Biguanides Failed - 10/17/2022 10:29 AM      Failed - Cr in normal range and within 360 days    Creat  Date Value Ref Range Status  04/26/2016 0.77 0.60 - 1.35 mg/dL Final   Creatinine, Ser  Date Value Ref Range Status  07/31/2022 0.70 (L) 0.76 - 1.27 mg/dL Final   Creatinine,U  Date Value Ref Range Status  08/05/2013 138.5 mg/dL Final    Comment:      Cutoff Values for Urine Drug Screen:         Drug Class           Cutoff (ng/mL)         Amphetamines            1000         Barbiturates             200         Cocaine Metabolites      300         Benzodiazepines          200         Methadone                300         Opiates                 2000         Phencyclidine             25         Propoxyphene             300         Marijuana Metabolites     50   For medical purposes only.         Failed - B12 Level in normal range and within 720 days    No results found for: "VITAMINB12"       Passed - HBA1C is between 0 and 7.9 and within 180 days    HbA1c, POC (controlled diabetic range)  Date Value Ref Range Status  07/31/2022 6.8 0.0 - 7.0 % Final         Passed - eGFR in normal range and within 360 days    GFR, Est African American  Date Value Ref Range Status  04/26/2016 >89 >=60 mL/min Final   GFR calc Af Amer  Date Value Ref Range Status  05/03/2020 >60 >60 mL/min Final   GFR, Est Non African American  Date Value Ref Range Status  04/26/2016 >89 >=60 mL/min Final   GFR calc non Af Amer  Date Value Ref Range Status  05/04/2020 >60 >60 mL/min Final   eGFR  Date Value Ref Range Status  07/31/2022 112 >59 mL/min/1.73 Final         Passed - Valid encounter within last 6 months    Recent Outpatient Visits            2 months ago Type 2 diabetes mellitus with other specified complication, without long-term current use of insulin [facility])   [place], Vernia Buff, NP   11 months ago Primary hypertension   [facility]  [place], [place] W, NP   1 year ago Type 2 diabetes mellitus with other specified complication, without long-term current use of insulin ([place])   [place], [place] W, NP   1 year ago Primary hypertension   [place], Vernia Buff, NP   1 year ago Primary hypertension   [place], Vernia Buff, NP              Passed - CBC within normal limits and completed in the last 12 months    WBC  Date Value Ref Range Status  08/23/2022 11.0 (H) 4.0 - 10.5 K/uL Final   RBC  Date Value Ref Range Status  08/23/2022 5.25 4.22 - 5.81 Mil/uL Final   Hemoglobin  Date Value Ref Range Status  08/23/2022 16.4 13.0 - 17.0 g/dL Final  07/31/2022 16.5 13.0 - 17.7 g/dL Final   Total hemoglobin  Date Value Ref Range Status  06/14/2016 15.5 12.0 - 16.0 g/dL Final   HCT  Date Value Ref Range Status  08/23/2022 48.0 39.0 - 52.0 % Final   Hematocrit  Date Value Ref Range Status  07/31/2022 47.2 37.5 - 51.0 % Final   MCHC  Date Value Ref Range Status  08/23/2022 34.2 30.0 - 36.0 g/dL Final   [facility]  Date Value Ref Range Status  07/31/2022 31.2 26.6 - 33.0 pg Final  04/28/2020 31.3 26.0 - 34.0 pg Final   MCV  Date Value Ref Range Status  08/23/2022 91.4 78.0 - 100.0 fl Final  07/31/2022 89 79 - 97 fL Final   No results found for: "PLTCOUNTKUC", "LABPLAT", "POCPLA" RDW  Date Value Ref Range Status  08/23/2022 13.1 11.5 - 15.5 % Final  07/31/2022 12.2 11.6 - 15.4 % Final

## 2022-10-17 NOTE — Telephone Encounter (Signed)
Medication Refill - Medication: metFORMIN (GLUCOPHAGE) 500 MG tablet   Has the patient contacted their pharmacy? Yes.   Pt called in medication request to the pharmacy. Pt is out of medication.     Preferred Pharmacy (with phone number or street name):  [place] ([place]), [place] - [address] Phone: [phone]  Fax: [phone]       Has the patient been seen for an appointment in the last year OR does the patient have an upcoming appointment? Yes.      Agent: Please be advised that RX refills may take up to 3 business days. We ask that you follow-up with your pharmacy.

## 2022-10-28 ENCOUNTER — Other Ambulatory Visit: Payer: Self-pay | Admitting: Nurse Practitioner

## 2022-11-12 ENCOUNTER — Encounter: Payer: Self-pay | Admitting: Podiatry

## 2022-11-12 ENCOUNTER — Ambulatory Visit (INDEPENDENT_AMBULATORY_CARE_PROVIDER_SITE_OTHER): Payer: Medicaid Other | Admitting: Podiatry

## 2022-11-12 DIAGNOSIS — E119 Type 2 diabetes mellitus without complications: Secondary | ICD-10-CM

## 2022-11-12 DIAGNOSIS — M79674 Pain in right toe(s): Secondary | ICD-10-CM | POA: Diagnosis not present

## 2022-11-12 DIAGNOSIS — B351 Tinea unguium: Secondary | ICD-10-CM

## 2022-11-12 DIAGNOSIS — M79675 Pain in left toe(s): Secondary | ICD-10-CM | POA: Diagnosis not present

## 2022-11-12 NOTE — Progress Notes (Signed)
This patient returns to my office for at risk foot care.  This patient requires this care by a professional since this patient will be at risk due to having  diabetes.  This patient is unable to cut nails himself since the patient cannot reach his nails.These nails are painful walking and wearing shoes.  This patient presents for at risk foot care today.  General Appearance  Alert, conversant and in no acute stress.  Vascular  Dorsalis pedis and posterior tibial  pulses are palpable  bilaterally.  Capillary return is within normal limits  bilaterally. Temperature is within normal limits  bilaterally.  Neurologic  Senn-Weinstein monofilament wire test within normal limits  bilaterally. Muscle power within normal limits bilaterally.  Nails Thick disfigured discolored nails with subungual debris  from hallux to fifth toes bilaterally. No evidence of bacterial infection or drainage bilaterally.  Orthopedic  No limitations of motion  feet .  No crepitus or effusions noted.  No bony pathology or digital deformities noted.  Skin  normotropic skin with no porokeratosis noted bilaterally.  No signs of infections or ulcers noted.     Onychomycosis  Pain in right toes  Pain in left toes  Consent was obtained for treatment procedures.   Mechanical debridement of nails 1-5  bilaterally performed with a nail nipper.  Filed with dremel without incident.    Return office visit   3 months                   Told patient to return for periodic foot care and evaluation due to potential at risk complications.   Ayumi Wangerin DPM   

## 2022-11-13 ENCOUNTER — Encounter: Payer: Self-pay | Admitting: Nurse Practitioner

## 2022-11-25 ENCOUNTER — Other Ambulatory Visit: Payer: Self-pay | Admitting: Nurse Practitioner

## 2022-11-25 ENCOUNTER — Encounter: Payer: Self-pay | Admitting: Nurse Practitioner

## 2022-11-25 ENCOUNTER — Encounter: Payer: Self-pay | Admitting: Podiatry

## 2022-11-25 DIAGNOSIS — J418 Mixed simple and mucopurulent chronic bronchitis: Secondary | ICD-10-CM

## 2022-11-25 MED ORDER — PREDNISONE 20 MG PO TABS: 40.0000 mg | ORAL_TABLET | Freq: Every day | ORAL | 0 refills | Status: AC

## 2022-11-25 MED ORDER — AZITHROMYCIN 250 MG PO TABS: ORAL_TABLET | ORAL | 0 refills | Status: AC

## 2022-11-25 MED ORDER — ALBUTEROL SULFATE (2.5 MG/3ML) 0.083% IN NEBU: 2.5000 mg | INHALATION_SOLUTION | Freq: Four times a day (QID) | RESPIRATORY_TRACT | 1 refills | Status: DC | PRN

## 2022-11-25 MED ORDER — ALBUTEROL SULFATE HFA 108 (90 BASE) MCG/ACT IN AERS: 1.0000 | INHALATION_SPRAY | Freq: Four times a day (QID) | RESPIRATORY_TRACT | 3 refills | Status: DC | PRN

## 2022-11-25 MED ORDER — CALCIUM CARBONATE ANTACID 750 MG PO CHEW: 1.0000 | CHEWABLE_TABLET | Freq: Three times a day (TID) | ORAL | 3 refills | Status: DC | PRN

## 2022-12-09 ENCOUNTER — Other Ambulatory Visit: Payer: Self-pay | Admitting: Family Medicine

## 2022-12-09 DIAGNOSIS — I10 Essential (primary) hypertension: Secondary | ICD-10-CM

## 2022-12-09 DIAGNOSIS — M1A9XX0 Chronic gout, unspecified, without tophus (tophi): Secondary | ICD-10-CM

## 2022-12-10 NOTE — Telephone Encounter (Signed)
Requested Prescriptions  Pending Prescriptions Disp Refills   metoprolol tartrate (LOPRESSOR) 50 MG tablet [Pharmacy Med Name: Metoprolol Tartrate 50 MG Oral Tablet] 180 tablet 0    Sig: Take 1 tablet by mouth twice daily     Cardiovascular:  Beta Blockers Failed - 12/09/2022  5:02 PM      Failed - Last BP in normal range    BP Readings from Last 1 Encounters:  10/01/22 (!) 140/78         Passed - Last Heart Rate in normal range    Pulse Readings from Last 1 Encounters:  10/01/22 82         Passed - Valid encounter within last 6 months    Recent Outpatient Visits           4 months ago Type 2 diabetes mellitus with other specified complication, without long-term current use of insulin (HCC)   [facility] [place], [place] W, NP   1 year ago Primary hypertension   [place] [facility] [place], [place] W, NP   1 year ago Type 2 diabetes mellitus with other specified complication, without long-term current use of insulin (HCC)   [place] [facility] [place], [place] W, NP   1 year ago Primary hypertension   [place] [facility] [place], [place] W, NP   1 year ago Primary hypertension   [place] [facility] [place], [place] W, NP               allopurinol (ZYLOPRIM) 300 MG tablet [Pharmacy Med Name: Allopurinol 300 MG Oral Tablet] 90 tablet 0    Sig: Take 1 tablet by mouth once daily     Endocrinology:  Gout Agents - allopurinol Failed - 12/09/2022  5:02 PM      Failed - Uric Acid in normal range and within 360 days    Uric Acid  Date Value Ref Range Status  02/17/2019 5.5 3.7 - 8.6 mg/dL Final    Comment:               Therapeutic target for gout patients: <6.0         Failed - Cr in normal range and within 360 days    Creat  Date Value Ref Range Status  04/26/2016 0.77 0.60 - 1.35 mg/dL Final   Creatinine, Ser  Date Value Ref Range Status   07/31/2022 0.70 (L) 0.76 - 1.27 mg/dL Final   Creatinine,U  Date Value Ref Range Status  08/05/2013 138.5 mg/dL Final    Comment:      Cutoff Values for Urine Drug Screen:         Drug Class           Cutoff (ng/mL)         Amphetamines            1000         Barbiturates             200         Cocaine Metabolites      300         Benzodiazepines          200         Methadone                300         Opiates  2000         Phencyclidine             25         Propoxyphene             300         Marijuana Metabolites     50   For medical purposes only.         Passed - Valid encounter within last 12 months    Recent Outpatient Visits           4 months ago Type 2 diabetes mellitus with other specified complication, without long-term current use of insulin (HCC)   [place] [facility] [place], [place] W, NP   1 year ago Primary hypertension   [place] [facility] [place], Shea Stakes, NP   1 year ago Type 2 diabetes mellitus with other specified complication, without long-term current use of insulin (HCC)   [place] [facility] [place], [place] W, NP   1 year ago Primary hypertension   [place] [facility] [place], Shea Stakes, NP   1 year ago Primary hypertension   [place] [facility] [place], Shea Stakes, NP              Passed - CBC within normal limits and completed in the last 12 months    WBC  Date Value Ref Range Status  08/23/2022 11.0 (H) 4.0 - 10.5 K/uL Final   RBC  Date Value Ref Range Status  08/23/2022 5.25 4.22 - 5.81 Mil/uL Final   Hemoglobin  Date Value Ref Range Status  08/23/2022 16.4 13.0 - 17.0 g/dL Final  16/04/9603 54.0 13.0 - 17.7 g/dL Final   Total hemoglobin  Date Value Ref Range Status  06/14/2016 15.5 12.0 - 16.0 g/dL Final   HCT  Date Value Ref Range Status  08/23/2022 48.0 39.0 - 52.0 % Final    Hematocrit  Date Value Ref Range Status  07/31/2022 47.2 37.5 - 51.0 % Final   MCHC  Date Value Ref Range Status  08/23/2022 34.2 30.0 - 36.0 g/dL Final   [facility]  Date Value Ref Range Status  07/31/2022 31.2 26.6 - 33.0 pg Final  04/28/2020 31.3 26.0 - 34.0 pg Final   MCV  Date Value Ref Range Status  08/23/2022 91.4 78.0 - 100.0 fl Final  07/31/2022 89 79 - 97 fL Final   No results found for: "PLTCOUNTKUC", "LABPLAT", "POCPLA" RDW  Date Value Ref Range Status  08/23/2022 13.1 11.5 - 15.5 % Final  07/31/2022 12.2 11.6 - 15.4 % Final          famotidine (PEPCID) 20 MG tablet [Pharmacy Med Name: Famotidine 20 MG Oral Tablet] 90 tablet 0    Sig: Take 1 tablet by mouth once daily     Gastroenterology:  H2 Antagonists Passed - 12/09/2022  5:02 PM      Passed - Valid encounter within last 12 months    Recent Outpatient Visits           4 months ago Type 2 diabetes mellitus with other specified complication, without long-term current use of insulin [facility])   [place] [facility] [place], Shea Stakes, NP   1 year ago Primary hypertension   [place] [place], Shea Stakes, NP   1 year ago Type 2 diabetes mellitus  with other specified complication, without long-term current use of insulin [facility])   [facility] [place], Shea Stakes, NP   1 year ago Primary hypertension   [place] [facility] [place], Shea Stakes, NP   1 year ago Primary hypertension   [place] [place], Shea Stakes, NP

## 2022-12-11 ENCOUNTER — Other Ambulatory Visit: Payer: Self-pay | Admitting: Nurse Practitioner

## 2022-12-11 DIAGNOSIS — E782 Mixed hyperlipidemia: Secondary | ICD-10-CM

## 2022-12-12 ENCOUNTER — Other Ambulatory Visit: Payer: Self-pay | Admitting: Nurse Practitioner

## 2022-12-12 DIAGNOSIS — E782 Mixed hyperlipidemia: Secondary | ICD-10-CM

## 2022-12-14 MED ORDER — ATORVASTATIN CALCIUM 40 MG PO TABS
40.0000 mg | ORAL_TABLET | Freq: Every evening | ORAL | 0 refills | Status: DC
Start: 2022-12-14 — End: 2023-04-15

## 2022-12-21 ENCOUNTER — Telehealth: Payer: Self-pay

## 2022-12-21 NOTE — Telephone Encounter (Signed)
I received a fax for diabetic shoes. Patient needs an appointment with one of our Md's in office for the orders to be signed.

## 2023-01-27 ENCOUNTER — Other Ambulatory Visit: Payer: Self-pay | Admitting: Nurse Practitioner

## 2023-01-27 DIAGNOSIS — I10 Essential (primary) hypertension: Secondary | ICD-10-CM

## 2023-01-29 NOTE — Telephone Encounter (Signed)
Requested Prescriptions  Pending Prescriptions Disp Refills   amLODipine (NORVASC) 10 MG tablet [Pharmacy Med Name: amLODIPine Besylate 10 MG Oral Tablet] 90 tablet 0    Sig: TAKE 1 TABLET BY MOUTH AT BEDTIME     Cardiovascular: Calcium Channel Blockers 2 Failed - 01/27/2023  2:02 PM      Failed - Last BP in normal range    BP Readings from Last 1 Encounters:  10/01/22 (!) 140/78         Passed - Last Heart Rate in normal range    Pulse Readings from Last 1 Encounters:  10/01/22 82         Passed - Valid encounter within last 6 months    Recent Outpatient Visits           6 months ago Type 2 diabetes mellitus with other specified complication, without long-term current use of insulin (HCC)   [place] [facility] [place], Shea Stakes, NP   1 year ago Primary hypertension   [place] [facility] [place], [place] W, NP   1 year ago Type 2 diabetes mellitus with other specified complication, without long-term current use of insulin [facility])   [place] [facility] [place], Shea Stakes, NP   1 year ago Primary hypertension   [place] [facility] [place], Shea Stakes, NP   2 years ago Primary hypertension    [facility] [place], Shea Stakes, NP

## 2023-02-04 ENCOUNTER — Other Ambulatory Visit: Payer: Self-pay | Admitting: Internal Medicine

## 2023-02-08 DIAGNOSIS — Z85528 Personal history of other malignant neoplasm of kidney: Secondary | ICD-10-CM | POA: Diagnosis not present

## 2023-02-08 DIAGNOSIS — D49511 Neoplasm of unspecified behavior of right kidney: Secondary | ICD-10-CM | POA: Diagnosis not present

## 2023-02-10 ENCOUNTER — Other Ambulatory Visit: Payer: Self-pay | Admitting: Nurse Practitioner

## 2023-02-10 DIAGNOSIS — E1142 Type 2 diabetes mellitus with diabetic polyneuropathy: Secondary | ICD-10-CM

## 2023-02-11 NOTE — Telephone Encounter (Signed)
Courtesy refill. Patient will need an office visit for further refills. Requested Prescriptions  Pending Prescriptions Disp Refills   gabapentin (NEURONTIN) 300 MG capsule [Pharmacy Med Name: Gabapentin 300 MG Oral Capsule] 540 capsule 1    Sig: TAKE 3 CAPSULES BY MOUTH TWICE DAILY     Neurology: Anticonvulsants - gabapentin Failed - 02/10/2023  6:16 PM      Failed - Cr in normal range and within 360 days    Creat  Date Value Ref Range Status  04/26/2016 0.77 0.60 - 1.35 mg/dL Final   Creatinine, Ser  Date Value Ref Range Status  07/31/2022 0.70 (L) 0.76 - 1.27 mg/dL Final   Creatinine,U  Date Value Ref Range Status  08/05/2013 138.5 mg/dL Final    Comment:      Cutoff Values for Urine Drug Screen:         Drug Class           Cutoff (ng/mL)         Amphetamines            1000         Barbiturates             200         Cocaine Metabolites      300         Benzodiazepines          200         Methadone                300         Opiates                 2000         Phencyclidine             25         Propoxyphene             300         Marijuana Metabolites     50   For medical purposes only.         Passed - Completed PHQ-2 or PHQ-9 in the last 360 days      Passed - Valid encounter within last 12 months    Recent Outpatient Visits           6 months ago Type 2 diabetes mellitus with other specified complication, without long-term current use of insulin [facility])   [place] [facility] [place], Shea Stakes, NP   1 year ago Primary hypertension   [facility] [place], [place] W, NP   1 year ago Type 2 diabetes mellitus with other specified complication, without long-term current use of insulin [facility])   [place] [facility] [place], Shea Stakes, NP   1 year ago Primary hypertension    [facility] [place], Shea Stakes, NP   2 years ago Primary hypertension   [facility] [place], [place] W, NP               DULoxetine (CYMBALTA) 60 MG capsule [Pharmacy Med Name: DULoxetine HCl 60 MG Oral Capsule Delayed Release Particles] 30 capsule 0    Sig: Take 1 capsule by mouth once daily     Psychiatry: Antidepressants - SNRI - duloxetine Failed - 02/10/2023  6:16 PM      Failed - Cr  in normal range and within 360 days    Creat  Date Value Ref Range Status  04/26/2016 0.77 0.60 - 1.35 mg/dL Final   Creatinine, Ser  Date Value Ref Range Status  07/31/2022 0.70 (L) 0.76 - 1.27 mg/dL Final   Creatinine,U  Date Value Ref Range Status  08/05/2013 138.5 mg/dL Final    Comment:      Cutoff Values for Urine Drug Screen:         Drug Class           Cutoff (ng/mL)         Amphetamines            1000         Barbiturates             200         Cocaine Metabolites      300         Benzodiazepines          200         Methadone                300         Opiates                 2000         Phencyclidine             25         Propoxyphene             300         Marijuana Metabolites     50   For medical purposes only.         Failed - Last BP in normal range    BP Readings from Last 1 Encounters:  10/01/22 (!) 140/78         Failed - Valid encounter within last 6 months    Recent Outpatient Visits           6 months ago Type 2 diabetes mellitus with other specified complication, without long-term current use of insulin [facility])   [facility] [place], Shea Stakes, NP   1 year ago Primary hypertension   [place] [place], [place] W, NP   1 year ago Type 2 diabetes mellitus with other specified complication, without long-term current use of insulin [facility])   [place] [facility] [place], Shea Stakes, NP   1 year ago Primary hypertension   [place] [facility] [place], Shea Stakes, NP   2 years ago Primary  hypertension   [place] [facility] [place], [place] W, NP              Passed - eGFR is 30 or above and within 360 days    GFR, Est African American  Date Value Ref Range Status  04/26/2016 >89 >=60 mL/min Final   GFR calc Af Amer  Date Value Ref Range Status  05/03/2020 >60 >60 mL/min Final   GFR, Est Non African American  Date Value Ref Range Status  04/26/2016 >89 >=60 mL/min Final   GFR calc non Af Amer  Date Value Ref Range Status  05/04/2020 >60 >60 mL/min Final   eGFR  Date Value Ref Range Status  07/31/2022 112 >59 mL/min/1.73 Final         Passed - Completed PHQ-2  or PHQ-9 in the last 360 days

## 2023-02-12 ENCOUNTER — Ambulatory Visit (INDEPENDENT_AMBULATORY_CARE_PROVIDER_SITE_OTHER): Payer: Medicaid Other | Admitting: Podiatry

## 2023-02-12 ENCOUNTER — Encounter: Payer: Self-pay | Admitting: Podiatry

## 2023-02-12 DIAGNOSIS — M79674 Pain in right toe(s): Secondary | ICD-10-CM | POA: Diagnosis not present

## 2023-02-12 DIAGNOSIS — E119 Type 2 diabetes mellitus without complications: Secondary | ICD-10-CM | POA: Diagnosis not present

## 2023-02-12 DIAGNOSIS — M79675 Pain in left toe(s): Secondary | ICD-10-CM

## 2023-02-12 DIAGNOSIS — B351 Tinea unguium: Secondary | ICD-10-CM | POA: Diagnosis not present

## 2023-02-12 NOTE — Progress Notes (Signed)
This patient returns to my office for at risk foot care.  This patient requires this care by a professional since this patient will be at risk due to having  diabetes.  This patient is unable to cut nails himself since the patient cannot reach his nails.These nails are painful walking and wearing shoes.  This patient presents for at risk foot care today.  General Appearance  Alert, conversant and in no acute stress.  Vascular  Dorsalis pedis and posterior tibial  pulses are palpable  bilaterally.  Capillary return is within normal limits  bilaterally. Temperature is within normal limits  bilaterally.  Neurologic  Senn-Weinstein monofilament wire test within normal limits  bilaterally. Muscle power within normal limits bilaterally.  Nails Thick disfigured discolored nails with subungual debris  from hallux to fifth toes bilaterally. No evidence of bacterial infection or drainage bilaterally.  Orthopedic  No limitations of motion  feet .  No crepitus or effusions noted.  No bony pathology or digital deformities noted.  Skin  normotropic skin with no porokeratosis noted bilaterally.  No signs of infections or ulcers noted.     Onychomycosis  Pain in right toes  Pain in left toes  Consent was obtained for treatment procedures.   Mechanical debridement of nails 1-5  bilaterally performed with a nail nipper.  Filed with dremel without incident.    Return office visit   3 months                   Told patient to return for periodic foot care and evaluation due to potential at risk complications.   Gregory Mayer DPM   

## 2023-02-18 ENCOUNTER — Telehealth: Payer: Self-pay | Admitting: Podiatry

## 2023-02-18 NOTE — Telephone Encounter (Signed)
Received communication that Preston [name] has attempted to schedule him multiple times but he has not answered, returned calls. If he still wishes to proceed he should call [phone]

## 2023-03-10 ENCOUNTER — Other Ambulatory Visit: Payer: Self-pay | Admitting: Nurse Practitioner

## 2023-03-10 DIAGNOSIS — E1142 Type 2 diabetes mellitus with diabetic polyneuropathy: Secondary | ICD-10-CM

## 2023-03-11 ENCOUNTER — Other Ambulatory Visit: Payer: Self-pay | Admitting: Internal Medicine

## 2023-03-12 ENCOUNTER — Other Ambulatory Visit: Payer: Self-pay | Admitting: Nurse Practitioner

## 2023-03-12 DIAGNOSIS — I10 Essential (primary) hypertension: Secondary | ICD-10-CM

## 2023-03-12 DIAGNOSIS — M1A9XX0 Chronic gout, unspecified, without tophus (tophi): Secondary | ICD-10-CM

## 2023-03-12 NOTE — Telephone Encounter (Signed)
Requested medications are due for refill today.  yes  Requested medications are on the active medications list.  yes  Last refill. 02/11/2023 #30 0 rf  Future visit scheduled.   no  Notes to clinic.  Courtesy refill already given.    Requested Prescriptions  Pending Prescriptions Disp Refills   DULoxetine (CYMBALTA) 60 MG capsule [Pharmacy Med Name: DULoxetine HCl 60 MG Oral Capsule Delayed Release Particles] 30 capsule 0    Sig: TAKE 1 CAPSULE BY MOUTH ONCE DAILY . APPOINTMENT REQUIRED FOR FUTURE REFILLS     Psychiatry: Antidepressants - SNRI - duloxetine Failed - 03/10/2023 11:07 AM      Failed - Cr in normal range and within 360 days    Creat  Date Value Ref Range Status  04/26/2016 0.77 0.60 - 1.35 mg/dL Final   Creatinine, Ser  Date Value Ref Range Status  07/31/2022 0.70 (L) 0.76 - 1.27 mg/dL Final   Creatinine,U  Date Value Ref Range Status  08/05/2013 138.5 mg/dL Final    Comment:      Cutoff Values for Urine Drug Screen:         Drug Class           Cutoff (ng/mL)         Amphetamines            1000         Barbiturates             200         Cocaine Metabolites      300         Benzodiazepines          200         Methadone                300         Opiates                 2000         Phencyclidine             25         Propoxyphene             300         Marijuana Metabolites     50   For medical purposes only.         Failed - Last BP in normal range    BP Readings from Last 1 Encounters:  10/01/22 (!) 140/78         Failed - Valid encounter within last 6 months    Recent Outpatient Visits           7 months ago Type 2 diabetes mellitus with other specified complication, without long-term current use of insulin [facility])   [place] [place], Shea Stakes, NP   1 year ago Primary hypertension   [place] [facility] [place], [place] W, NP   1 year ago Type 2 diabetes mellitus with other  specified complication, without long-term current use of insulin [facility])   [place] [facility] [place], Shea Stakes, NP   1 year ago Primary hypertension   [place] [facility] [place], Shea Stakes, NP   2 years ago Primary hypertension   [facility] [place], Shea Stakes, NP  Passed - eGFR is 30 or above and within 360 days    GFR, Est African American  Date Value Ref Range Status  04/26/2016 >89 >=60 mL/min Final   GFR calc Af Amer  Date Value Ref Range Status  05/03/2020 >60 >60 mL/min Final   GFR, Est Non African American  Date Value Ref Range Status  04/26/2016 >89 >=60 mL/min Final   GFR calc non Af Amer  Date Value Ref Range Status  05/04/2020 >60 >60 mL/min Final   eGFR  Date Value Ref Range Status  07/31/2022 112 >59 mL/min/1.73 Final         Passed - Completed PHQ-2 or PHQ-9 in the last 360 days

## 2023-03-17 ENCOUNTER — Other Ambulatory Visit: Payer: Self-pay | Admitting: Nurse Practitioner

## 2023-03-17 DIAGNOSIS — J418 Mixed simple and mucopurulent chronic bronchitis: Secondary | ICD-10-CM

## 2023-03-19 NOTE — Telephone Encounter (Signed)
Requested Prescriptions  Pending Prescriptions Disp Refills   VENTOLIN HFA 108 (90 Base) MCG/ACT inhaler [Pharmacy Med Name: Ventolin HFA 108 (90 Base) MCG/ACT Inhalation Aerosol Solution] 18 g 0    Sig: INHALE 1 TO 2 PUFFS BY MOUTH EVERY 6 HOURS AS NEEDED FOR WHEEZING FOR SHORTNESS OF BREATH     Pulmonology:  Beta Agonists 2 Failed - 03/17/2023 11:19 AM      Failed - Last BP in normal range    BP Readings from Last 1 Encounters:  10/01/22 (!) 140/78         Passed - Last Heart Rate in normal range    Pulse Readings from Last 1 Encounters:  10/01/22 82         Passed - Valid encounter within last 12 months    Recent Outpatient Visits           7 months ago Type 2 diabetes mellitus with other specified complication, without long-term current use of insulin (HCC)   [place] [facility] [place], Shea Stakes, NP   1 year ago Primary hypertension   [place] [facility] [place], [place] W, NP   1 year ago Type 2 diabetes mellitus with other specified complication, without long-term current use of insulin [facility])   [place] [facility] [place], Shea Stakes, NP   1 year ago Primary hypertension   [place] [facility] [place], Shea Stakes, NP   2 years ago Primary hypertension   [place] [facility] [place], Shea Stakes, NP       Future Appointments             In 3 weeks Claiborne Rigg, NP [facility]

## 2023-04-08 ENCOUNTER — Other Ambulatory Visit: Payer: Self-pay | Admitting: Nurse Practitioner

## 2023-04-08 ENCOUNTER — Encounter: Payer: Self-pay | Admitting: Nurse Practitioner

## 2023-04-08 DIAGNOSIS — E1142 Type 2 diabetes mellitus with diabetic polyneuropathy: Secondary | ICD-10-CM

## 2023-04-08 MED ORDER — DULOXETINE HCL 60 MG PO CPEP
60.0000 mg | ORAL_CAPSULE | Freq: Every day | ORAL | 1 refills | Status: DC
Start: 2023-04-08 — End: 2023-06-11

## 2023-04-15 ENCOUNTER — Ambulatory Visit: Payer: Medicaid Other | Attending: Nurse Practitioner | Admitting: Nurse Practitioner

## 2023-04-15 ENCOUNTER — Encounter: Payer: Self-pay | Admitting: Nurse Practitioner

## 2023-04-15 VITALS — BP 147/77 | HR 76 | Ht 72.25 in | Wt 291.0 lb

## 2023-04-15 DIAGNOSIS — E1169 Type 2 diabetes mellitus with other specified complication: Secondary | ICD-10-CM | POA: Diagnosis not present

## 2023-04-15 DIAGNOSIS — E782 Mixed hyperlipidemia: Secondary | ICD-10-CM

## 2023-04-15 DIAGNOSIS — Z7984 Long term (current) use of oral hypoglycemic drugs: Secondary | ICD-10-CM

## 2023-04-15 DIAGNOSIS — K219 Gastro-esophageal reflux disease without esophagitis: Secondary | ICD-10-CM

## 2023-04-15 DIAGNOSIS — D72829 Elevated white blood cell count, unspecified: Secondary | ICD-10-CM

## 2023-04-15 DIAGNOSIS — F17200 Nicotine dependence, unspecified, uncomplicated: Secondary | ICD-10-CM | POA: Diagnosis not present

## 2023-04-15 DIAGNOSIS — Z1211 Encounter for screening for malignant neoplasm of colon: Secondary | ICD-10-CM

## 2023-04-15 DIAGNOSIS — I10 Essential (primary) hypertension: Secondary | ICD-10-CM | POA: Diagnosis not present

## 2023-04-15 DIAGNOSIS — M1A9XX0 Chronic gout, unspecified, without tophus (tophi): Secondary | ICD-10-CM

## 2023-04-15 DIAGNOSIS — F411 Generalized anxiety disorder: Secondary | ICD-10-CM

## 2023-04-15 DIAGNOSIS — D2330 Other benign neoplasm of skin of unspecified part of face: Secondary | ICD-10-CM

## 2023-04-15 DIAGNOSIS — K429 Umbilical hernia without obstruction or gangrene: Secondary | ICD-10-CM

## 2023-04-15 DIAGNOSIS — H6123 Impacted cerumen, bilateral: Secondary | ICD-10-CM

## 2023-04-15 LAB — POCT GLYCOSYLATED HEMOGLOBIN (HGB A1C): HbA1c, POC (controlled diabetic range): 6.9 % (ref 0.0–7.0)

## 2023-04-15 MED ORDER — BUSPIRONE HCL 15 MG PO TABS
15.0000 mg | ORAL_TABLET | Freq: Two times a day (BID) | ORAL | 1 refills | Status: DC
Start: 2023-04-15 — End: 2023-07-15

## 2023-04-15 MED ORDER — ATORVASTATIN CALCIUM 40 MG PO TABS
40.0000 mg | ORAL_TABLET | Freq: Every evening | ORAL | 1 refills | Status: DC
Start: 2023-04-15 — End: 2023-07-15

## 2023-04-15 MED ORDER — ALLOPURINOL 300 MG PO TABS
300.0000 mg | ORAL_TABLET | Freq: Every day | ORAL | 1 refills | Status: DC
Start: 2023-04-15 — End: 2023-07-15

## 2023-04-15 MED ORDER — HYDROXYZINE HCL 50 MG PO TABS
50.0000 mg | ORAL_TABLET | Freq: Three times a day (TID) | ORAL | 3 refills | Status: DC | PRN
Start: 2023-04-15 — End: 2023-07-15

## 2023-04-15 MED ORDER — AMLODIPINE BESYLATE 10 MG PO TABS: 10.0000 mg | ORAL_TABLET | Freq: Every day | ORAL | 1 refills | Status: DC

## 2023-04-15 MED ORDER — METFORMIN HCL 500 MG PO TABS
1000.0000 mg | ORAL_TABLET | Freq: Two times a day (BID) | ORAL | 1 refills | Status: DC
Start: 2023-04-15 — End: 2023-07-15

## 2023-04-15 MED ORDER — METOPROLOL TARTRATE 50 MG PO TABS
50.0000 mg | ORAL_TABLET | Freq: Two times a day (BID) | ORAL | 1 refills | Status: DC
Start: 2023-04-15 — End: 2023-07-15

## 2023-04-15 MED ORDER — FAMOTIDINE 20 MG PO TABS
20.0000 mg | ORAL_TABLET | Freq: Every day | ORAL | 1 refills | Status: DC
Start: 2023-04-15 — End: 2023-07-15

## 2023-04-15 NOTE — Progress Notes (Unsigned)
Assessment & Plan:  Preston "Deniece Portela" was seen today for medical management of chronic issues.  Diagnoses and all orders for this visit:  Type 2 diabetes mellitus with other specified complication, without long-term current use of insulin (HCC) -     POCT glycosylated hemoglobin (Hb A1C) -     Urine Albumin/Creatinine with ratio (send out) [LAB689] -     Ambulatory referral to Ophthalmology -     metFORMIN (GLUCOPHAGE) 500 MG tablet; Take 2 tablets (1,000 mg total) by mouth 2 (two) times daily with a meal. TAKE 2 TABLETS BY MOUTH TWICE DAILY WITH  A  MEAL. OFFICE VISIT NEEDED FOR ADDITIONAL REFILLS  Primary hypertension Not at goal Recommend stop smoking -     CMP14+EGFR -     amLODipine (NORVASC) 10 MG tablet; Take 1 tablet (10 mg total) by mouth at bedtime. -     metoprolol tartrate (LOPRESSOR) 50 MG tablet; Take 1 tablet (50 mg total) by mouth 2 (two) times daily. Continue all antihypertensives as prescribed.  Reminded to bring in blood pressure log for follow  up appointment.  RECOMMENDATIONS: DASH/Mediterranean Diets are healthier choices for HTN.    Tobacco dependence -     CT CHEST LUNG CA SCREEN LOW DOSE W/O CM; Future  Colon cancer screening -     Ambulatory referral to Gastroenterology  Leukocytosis, unspecified type -     CBC with Differential/Platelet  Umbilical hernia without obstruction and without gangrene Refer to Dr. Lorin Picket per Dr. Laverle Patter with [facility] for combined surgery on kidney and hernia -     Ambulatory referral to General Surgery  Cyst, dermoid, face -     Ambulatory referral to Dermatology  Bilateral impacted cerumen He endorses bilateral hearing loss History of cerumen impaction -     Ambulatory referral to ENT  Chronic gout without tophus, unspecified cause, unspecified site -     allopurinol (ZYLOPRIM) 300 MG tablet; Take 1 tablet (300 mg total) by mouth daily.  Mixed hyperlipidemia -     atorvastatin (LIPITOR) 40 MG tablet; Take 1 tablet (40 mg  total) by mouth every evening. INSTRUCTIONS: Work on a low fat, heart healthy diet and participate in regular aerobic exercise program by working out at least 150 minutes per week; 5 days a week-30 minutes per day. Avoid red meat/beef/steak,  fried foods. junk foods, sodas, sugary drinks, unhealthy snacking, alcohol and smoking.  Drink at least 80 oz of water per day and monitor your carbohydrate intake daily.    GAD (generalized anxiety disorder) -     hydrOXYzine (ATARAX) 50 MG tablet; Take 1 tablet (50 mg total) by mouth 3 (three) times daily as needed. for anxiety -     busPIRone (BUSPAR) 15 MG tablet; Take 1-2 tablets (15-30 mg total) by mouth 2 (two) times daily. For anxiety  Gastroesophageal reflux disease without esophagitis -     famotidine (PEPCID) 20 MG tablet; Take 1 tablet (20 mg total) by mouth daily. INSTRUCTIONS: Avoid GERD Triggers: acidic, spicy or fried foods, caffeine, coffee, sodas,  alcohol and chocolate.     Patient has been counseled on age-appropriate routine health concerns for screening and prevention. These are reviewed and up-to-date. Referrals have been placed accordingly. Immunizations are up-to-date or declined.    Subjective:   Chief Complaint  Patient presents with  . Medical Management of Chronic Issues   HPI Preston [name] 52 y.o. male presents to office today  He has a past medical history of Anxiety,  Asthma,  CHF  Chronic lower back pain, COPD, GERD, Gout, History of Clostridium difficile infection (2014), Hypercholesterolemia, Hypertension, DM, Osteoarthritis, Panic attacks, and Shoulder pain, right (04/18/2017).       DM Diabetes is well controlled today. He is taking metformin 1000 mg BID. He takes gabapentin and cymbalta daily as prescribed for peripheral neuropathy. Podiatry referral was placed today for foot care.  Lab Results  Component Value Date   HGBA1C 6.9 04/15/2023    Lab Results  Component Value Date   HGBA1C 6.8 07/31/2022      HTN Blood pressure continues elevated above goal despite switching lisinopril to valsartan 40 mg daily.  He is also taking metoprolol 50 mg twice daily and amlodipine 10 mg daily.  Sleep study was ordered however I do not see where it has been scheduled.  He also endorses symptoms of OSA. Sleep study 7 years ago was normal. Will need to reorder due to risk factors.  He reports blood pressures at home are 130/80s on average BP Readings from Last 3 Encounters:  04/15/23 (!) 147/77  10/01/22 (!) 140/78  08/23/22 130/80    Sleep Apnea: Patient presents with possible obstructive sleep apnea. Patent has a several years history of symptoms of daytime fatigue, morning fatigue, hypertension, and obesity. Patient generally gets a few hours of sleep per night, and states they generally have nightime awakenings and difficulty falling back asleep if awakened. Snoring of moderate to severe severity is present. Apneic episodes are present.   Anxiety He does not feel hydroxyzine (script expired) is effective along with cymbalta in relieving anxiety. Currently prescribed 60mg  daily of cymbalta and 50 mg TID prn of hydroxyzine.   Review of Systems  Constitutional:  Negative for fever, malaise/fatigue and weight loss.  HENT:  Positive for hearing loss and tinnitus. Negative for congestion, ear discharge, ear pain, nosebleeds, sinus pain and sore throat.   Eyes: Negative.  Negative for blurred vision, double vision and photophobia.  Respiratory: Negative.  Negative for cough, shortness of breath and stridor.   Cardiovascular: Negative.  Negative for chest pain, palpitations and leg swelling.  Gastrointestinal: Negative.  Negative for heartburn, nausea and vomiting.  Musculoskeletal: Negative.  Negative for myalgias.  Skin:        Sebaceous cyst on right side of face; slowing increasing in size  Neurological: Negative.  Negative for dizziness, focal weakness, seizures and headaches.  Psychiatric/Behavioral:  Negative.  Negative for suicidal ideas.     Past Medical History:  Diagnosis Date  . Anxiety   . Asthma    daily inhaler  . Cancer (HCC)    pt. reports diagnosed with skin cancer on face/uncertain type but he reports may be basal cell, pending surgery to remove 09/03/18  . CHF (congestive heart failure) (HCC)    no cardiologist  . Chronic cough    states due to COPD  . Chronic lower back pain   . COPD (chronic obstructive pulmonary disease) (HCC)    no home O2  . Dyspnea    with humidity and high pollen count  . Edentulous    upper gum  . GERD (gastroesophageal reflux disease)   . Gout   . History of Clostridium difficile infection 2014  . Hypercholesterolemia   . Hypertension    states under control with med., has been on med. x 4 yr.  . Insulin dependent diabetes mellitus    type 2   . Osteoarthritis    bilat. hip  . Panic attacks   .  Shoulder pain, right 04/18/2017    Past Surgical History:  Procedure Laterality Date  . COLON SURGERY     14 inches of colon removed  . COLONOSCOPY WITH PROPOFOL N/A 04/01/2013   Procedure: COLONOSCOPY WITH PROPOFOL;  Surgeon: Willis Modena, MD;  Location: [facility] ENDOSCOPY;  Service: Endoscopy;  Laterality: N/A;  . FLEXIBLE SIGMOIDOSCOPY N/A 02/28/2013   Procedure: FLEXIBLE SIGMOIDOSCOPY;  Surgeon: Vertell Novak., MD;  Location[facility] END[facility];  Service: Endoscopy;  Laterality: N/A;  . LAPAROSCOPIC APPENDECTOMY  07/05/2009  . LAPAROSCOPIC LOW ANTERIOR RESECTION N/A 07/08/2013   Procedure: LAPAROSCOPIC LOW ANTERIOR RESECTION WITH TAKEDOWN OF SPLENIC FLEXURE.;  Surgeon: Ernestene Mention, MD;  Location: [facility] OR;  Service: General;  Laterality: N/A;  . MANDIBLE SURGERY Left 2010   "dry skin pocket cut out"  . MULTIPLE EXTRACTIONS WITH ALVEOLOPLASTY N/A 04/24/2017   Procedure: SURGICAL REMOVAL OF TEETH NUMBERS `7 20-29 AND 31;  Surgeon: Vivia Ewing, DMD;  Location: [facility];  Service: Oral Surgery;  Laterality: N/A;  . ROBOTIC  ASSITED PARTIAL NEPHRECTOMY Left 05/02/2020   Procedure: XI ROBOTIC ASSITED LAPAROSCOPIC PARTIAL NEPHRECTOMY;  Surgeon: Heloise Purpura, MD;  Location: [facility] ORS;  Service: Urology;  Laterality: Left;  . TOTAL HIP ARTHROPLASTY Left 06/25/2016   Procedure: TOTAL HIP ARTHROPLASTY ANTERIOR APPROACH;  Surgeon: Gean Birchwood, MD;  Location: [facility] OR;  Service: Orthopedics;  Laterality: Left;  . TOTAL HIP ARTHROPLASTY Right 09/10/2016   Procedure: TOTAL HIP ARTHROPLASTY ANTERIOR APPROACH;  Surgeon: Gean Birchwood, MD;  Location: [facility] OR;  Service: Orthopedics;  Laterality: Right;    Family History  Problem Relation Age of Onset  . Cancer Father        lymphoma  . Heart attack Mother     Social History Reviewed with no changes to be made today.   Outpatient Medications Prior to Visit  Medication Sig Dispense Refill  . albuterol (PROVENTIL) (2.5 MG/3ML) 0.083% nebulizer solution Take 3 mLs (2.5 mg total) by nebulization every 6 (six) hours as needed for wheezing or shortness of breath. 150 mL 1  . Blood Glucose Monitoring Suppl (ACCU-CHEK AVIVA PLUS) w/Device KIT 1 each by Does not apply route 3 (three) times daily. 1 kit 0  . Blood Pressure Monitor DEVI Please provide patient with insurance approved blood pressure monitor I10.0 1 each 0  . calcium carbonate (TUMS EX) 750 MG chewable tablet Chew 1 tablet (750 mg total) by mouth 3 (three) times daily as needed for heartburn. 90 tablet 3  . Colchicine (MITIGARE) 0.6 MG CAPS Take 0.6 mg by mouth daily as needed. M10.9 30 capsule 3  . DULoxetine (CYMBALTA) 60 MG capsule Take 1 capsule (60 mg total) by mouth daily. 30 capsule 1  . gabapentin (NEURONTIN) 300 MG capsule TAKE 3 CAPSULES BY MOUTH TWICE DAILY 540 capsule 1  . glucose blood (ACCU-CHEK AVIVA PLUS) test strip Use as instructed for 3 times daily testing of blood sugar. E11.9 300 each 2  . omeprazole (PRILOSEC) 40 MG capsule Take 1 capsule (40 mg total) by mouth daily. 90 capsule 1  . SYMBICORT 80-4.5 MCG/ACT  inhaler Inhale 2 puffs into the lungs in the morning and at bedtime. 11 g 3  . valsartan (DIOVAN) 40 MG tablet Take 1 tablet (40 mg total) by mouth daily. 90 tablet 3  . VENTOLIN HFA 108 (90 Base) MCG/ACT inhaler INHALE 1 TO 2 PUFFS BY MOUTH EVERY 6 HOURS AS NEEDED FOR WHEEZING FOR SHORTNESS OF BREATH 18 g 0  . allopurinol (  ZYLOPRIM) 300 MG tablet Take 1 tablet by mouth once daily 30 tablet 0  . amLODipine (NORVASC) 10 MG tablet TAKE 1 TABLET BY MOUTH AT BEDTIME 90 tablet 0  . atorvastatin (LIPITOR) 40 MG tablet Take 1 tablet (40 mg total) by mouth every evening. 90 tablet 0  . famotidine (PEPCID) 20 MG tablet Take 1 tablet by mouth once daily 30 tablet 0  . hydrOXYzine (ATARAX/VISTARIL) 50 MG tablet Take 1 tablet (50 mg total) by mouth 3 (three) times daily as needed. for anxiety 90 tablet 1  . metFORMIN (GLUCOPHAGE) 500 MG tablet TAKE 2 TABLETS BY MOUTH TWICE DAILY WITH  A  MEAL. OFFICE VISIT NEEDED FOR ADDITIONAL REFILLS 360 tablet 0  . metoprolol tartrate (LOPRESSOR) 50 MG tablet Take 1 tablet by mouth twice daily 60 tablet 0   No facility-administered medications prior to visit.    Allergies  Allergen Reactions  . Morphine Itching and Nausea And Vomiting  . Ace Inhibitors Cough  . Adhesive [Tape] Itching and Rash       Objective:    BP (!) 147/77 (BP Location: Left Arm, Patient Position: Sitting, Cuff Size: Normal)   Pulse 76   Ht 6' 0.25" (1.835 m)   Wt 291 lb (132 kg)   SpO2 96%   BMI 39.19 kg/m  Wt Readings from Last 3 Encounters:  04/15/23 291 lb (132 kg)  10/01/22 (!) 302 lb 12.8 oz (137.3 kg)  08/23/22 (!) 303 lb (137.4 kg)    Physical Exam Vitals and nursing note reviewed.  Constitutional:      Appearance: He is well-developed.  HENT:     Head: Normocephalic and atraumatic. Mass (cystic) present.      Right Ear: Decreased hearing noted. There is impacted cerumen.     Left Ear: Decreased hearing noted. There is impacted cerumen.  Cardiovascular:     Rate and  Rhythm: Normal rate and regular rhythm.     Heart sounds: Normal heart sounds. No murmur heard.    No friction rub. No gallop.  Pulmonary:     Effort: Pulmonary effort is normal. No tachypnea or respiratory distress.     Breath sounds: Normal breath sounds. No decreased breath sounds, wheezing, rhonchi or rales.  Chest:     Chest wall: No tenderness.  Abdominal:     General: Bowel sounds are normal.     Palpations: Abdomen is soft.     Hernia: A hernia is present. Hernia is present in the umbilical area.  Musculoskeletal:        General: Normal range of motion.     Cervical back: Normal range of motion.  Skin:    General: Skin is warm and dry.  Neurological:     Mental Status: He is alert and oriented to person, place, and time.     Coordination: Coordination normal.  Psychiatric:        Behavior: Behavior normal. Behavior is cooperative.        Thought Content: Thought content normal.        Judgment: Judgment normal.         Patient has been counseled extensively about nutrition and exercise as well as the importance of adherence with medications and regular follow-up. The patient was given clear instructions to go to ER or return to medical center if symptoms don't improve, worsen or new problems develop. The patient verbalized understanding.   Follow-up: Return in about 3 months (around 07/15/2023).   Claiborne Rigg, FNP-BC [place]  [facility] [place], [place] [phone]   04/15/2023, 5:03 PM

## 2023-04-15 NOTE — Patient Instructions (Addendum)
[facility] ph. # [phone]  address [address]  [place], [place]

## 2023-04-16 LAB — CBC WITH DIFFERENTIAL/PLATELET
Basophils Absolute: 0.1 10*3/uL (ref 0.0–0.2)
Basos: 1 %
EOS (ABSOLUTE): 0.2 10*3/uL (ref 0.0–0.4)
Eos: 2 %
Hematocrit: 48.3 % (ref 37.5–51.0)
Hemoglobin: 17.1 g/dL (ref 13.0–17.7)
Immature Grans (Abs): 0.1 10*3/uL (ref 0.0–0.1)
Immature Granulocytes: 1 %
Lymphocytes Absolute: 1.6 10*3/uL (ref 0.7–3.1)
Lymphs: 16 %
MCH: 32.6 pg (ref 26.6–33.0)
MCHC: 35.4 g/dL (ref 31.5–35.7)
MCV: 92 fL (ref 79–97)
Monocytes Absolute: 0.7 10*3/uL (ref 0.1–0.9)
Monocytes: 7 %
Neutrophils Absolute: 7.8 10*3/uL — ABNORMAL HIGH (ref 1.4–7.0)
Neutrophils: 73 %
Platelets: 294 10*3/uL (ref 150–450)
RBC: 5.25 x10E6/uL (ref 4.14–5.80)
RDW: 12.6 % (ref 11.6–15.4)
WBC: 10.5 10*3/uL (ref 3.4–10.8)

## 2023-04-16 LAB — MICROALBUMIN / CREATININE URINE RATIO
Creatinine, Urine: 77.3 mg/dL
Microalb/Creat Ratio: 1360 mg/g{creat} — ABNORMAL HIGH (ref 0–29)
Microalbumin, Urine: 1051 ug/mL

## 2023-04-16 LAB — CMP14+EGFR
ALT: 53 IU/L — ABNORMAL HIGH (ref 0–44)
AST: 25 IU/L (ref 0–40)
Albumin: 4.8 g/dL (ref 3.8–4.9)
Alkaline Phosphatase: 83 IU/L (ref 44–121)
BUN/Creatinine Ratio: 7 — ABNORMAL LOW (ref 9–20)
BUN: 5 mg/dL — ABNORMAL LOW (ref 6–24)
Bilirubin Total: 0.3 mg/dL (ref 0.0–1.2)
CO2: 22 mmol/L (ref 20–29)
Calcium: 10.5 mg/dL — ABNORMAL HIGH (ref 8.7–10.2)
Chloride: 94 mmol/L — ABNORMAL LOW (ref 96–106)
Creatinine, Ser: 0.68 mg/dL — ABNORMAL LOW (ref 0.76–1.27)
Globulin, Total: 1.9 g/dL (ref 1.5–4.5)
Glucose: 139 mg/dL — ABNORMAL HIGH (ref 70–99)
Potassium: 4.6 mmol/L (ref 3.5–5.2)
Sodium: 133 mmol/L — ABNORMAL LOW (ref 134–144)
Total Protein: 6.7 g/dL (ref 6.0–8.5)
eGFR: 113 mL/min/{1.73_m2} (ref >59)

## 2023-04-23 ENCOUNTER — Encounter: Payer: Self-pay | Admitting: Nurse Practitioner

## 2023-04-26 ENCOUNTER — Inpatient Hospital Stay: Admission: RE | Admit: 2023-04-26 | Payer: Medicaid Other | Source: Ambulatory Visit

## 2023-05-20 ENCOUNTER — Ambulatory Visit (INDEPENDENT_AMBULATORY_CARE_PROVIDER_SITE_OTHER): Payer: Medicaid Other | Admitting: Podiatry

## 2023-05-20 ENCOUNTER — Encounter: Payer: Self-pay | Admitting: Podiatry

## 2023-05-20 DIAGNOSIS — E119 Type 2 diabetes mellitus without complications: Secondary | ICD-10-CM | POA: Diagnosis not present

## 2023-05-20 DIAGNOSIS — M79675 Pain in left toe(s): Secondary | ICD-10-CM | POA: Diagnosis not present

## 2023-05-20 DIAGNOSIS — M79674 Pain in right toe(s): Secondary | ICD-10-CM

## 2023-05-20 DIAGNOSIS — B351 Tinea unguium: Secondary | ICD-10-CM

## 2023-05-20 NOTE — Progress Notes (Signed)
This patient returns to my office for at risk foot care.  This patient requires this care by a professional since this patient will be at risk due to having  diabetes.  This patient is unable to cut nails himself since the patient cannot reach his nails.These nails are painful walking and wearing shoes.  This patient presents for at risk foot care today.  General Appearance  Alert, conversant and in no acute stress.  Vascular  Dorsalis pedis and posterior tibial  pulses are palpable  bilaterally.  Capillary return is within normal limits  bilaterally. Temperature is within normal limits  bilaterally.  Neurologic  Senn-Weinstein monofilament wire test within normal limits  bilaterally. Muscle power within normal limits bilaterally.  Nails Thick disfigured discolored nails with subungual debris  from hallux to fifth toes bilaterally. No evidence of bacterial infection or drainage bilaterally.  Orthopedic  No limitations of motion  feet .  No crepitus or effusions noted.  No bony pathology or digital deformities noted.  Skin  normotropic skin with no porokeratosis noted bilaterally.  No signs of infections or ulcers noted.     Onychomycosis  Pain in right toes  Pain in left toes  Consent was obtained for treatment procedures.   Mechanical debridement of nails 1-5  bilaterally performed with a nail nipper.  Filed with dremel without incident.    Return office visit   3 months                   Told patient to return for periodic foot care and evaluation due to potential at risk complications.   Nicholi Ghuman DPM   

## 2023-06-04 ENCOUNTER — Institutional Professional Consult (permissible substitution) (INDEPENDENT_AMBULATORY_CARE_PROVIDER_SITE_OTHER): Payer: Medicaid Other

## 2023-06-07 ENCOUNTER — Institutional Professional Consult (permissible substitution) (INDEPENDENT_AMBULATORY_CARE_PROVIDER_SITE_OTHER): Payer: Medicaid Other

## 2023-06-10 ENCOUNTER — Other Ambulatory Visit: Payer: Self-pay | Admitting: Nurse Practitioner

## 2023-06-10 DIAGNOSIS — E1142 Type 2 diabetes mellitus with diabetic polyneuropathy: Secondary | ICD-10-CM

## 2023-06-11 ENCOUNTER — Ambulatory Visit: Payer: Medicaid Other | Admitting: Internal Medicine

## 2023-06-11 ENCOUNTER — Encounter: Payer: Self-pay | Admitting: Internal Medicine

## 2023-06-11 NOTE — Progress Notes (Deleted)
Preston [name], male    DOB: 05-Aug-1970   MRN: [id number]   Brief patient profile:  87  yowm active smoker/MM  referred to pulmonary clinic 08/23/2022 by Bertram Denver  for nasal congestion/ cough / sob    x  aorund 2013     History of Present Illness   08/23/2022  Pulmonary/ 1st office eval/Preston [name]  off acei x a week  Chief Complaint  Patient presents with   Consult    COPD.  Wheezing with cough.  White mucus production.  Sinus congestion throughout the day.  Sx x 2 years.  Recurrent prednisone tx. PCP changed lisinopril to valsartan for increased BP.  Dyspnea:  more slowed by feet than breathing  Cough: pass out from cough so no longer drive  Sleep: prone/on side  SABA use: none  Improved off acei and on pred   Rec Plan A = Automatic = Always=    Symbicort 80 Take 2 puffs first thing in am and then another 2 puffs about 12 hours later.  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 No new co's.   Dyspnea:  mb and back is 40 yards  uphill sometimes has to stop Cough: gone 1st thing in am > smoker's rattle white white  Sleeping: flat bed/ 2 pillow  SABA use: once a day hfa/ never pre challenges / no neb 02: none  Covid status:   never vax never infected  Lung cancer screening :  done  Rec Work on inhaler technique:    Also  Ok to try albuterol 15 min before an activity (on  alternating days)  that you know would usually make you short of breath          06/11/2023  f/u ov/Preston [name] office/Preston [name] re: doe / cough x 2013 ? Acei case? maint on ***  No chief complaint on file.   Dyspnea:  *** Cough: *** Sleeping: ***   resp cc  SABA use: *** 02: ***  Lung cancer screening: ***   No obvious day to day or daytime variability or assoc excess/ purulent sputum or mucus plugs or hemoptysis or cp or chest tightness, subjective wheeze or overt sinus or hb symptoms.    Also denies any obvious fluctuation of symptoms with weather or environmental changes or other aggravating or alleviating factors except as outlined above   No unusual exposure hx or h/o childhood pna/ asthma or knowledge of premature birth.  Current Allergies, Complete Past Medical History, Past Surgical History, Family History, and Social History were reviewed in [employer] record.  ROS  The following are not active complaints unless bolded Hoarseness, sore throat, dysphagia, dental problems, itching, sneezing,  nasal congestion or discharge of  excess mucus or purulent secretions, ear ache,   fever, chills, sweats, unintended wt loss or wt gain, classically pleuritic or exertional cp,  orthopnea pnd or arm/hand swelling  or leg swelling, presyncope, palpitations, abdominal pain, anorexia, nausea, vomiting, diarrhea  or change in bowel habits or change in bladder habits, change in stools or change in urine, dysuria, hematuria,  rash, arthralgias, visual complaints, headache, numbness, weakness or ataxia or problems with walking or coordination,  change in mood or  memory.        No outpatient medications have been marked as taking for the 06/11/23 encounter (Appointment) with Nyoka Cowden, MD.                  Past Medical History:  Diagnosis Date   Anxiety    Asthma    daily inhaler   Cancer [facility])    pt. reports diagnosed with skin cancer on face/uncertain type but  he reports may be basal cell, pending surgery to remove 09/03/18   CHF (congestive heart failure) (HCC)    no cardiologist   Chronic cough    states due to COPD   Chronic lower back pain    COPD (chronic obstructive pulmonary disease) (HCC)    no home O2   Dyspnea    with humidity and high pollen count   Edentulous    upper gum   GERD (gastroesophageal reflux disease)    Gout    History of Clostridium difficile infection 2014   Hypercholesterolemia    Hypertension    states under control with med., has been on med. x 4 yr.   Insulin dependent diabetes mellitus    type 2    Osteoarthritis    bilat. hip   Panic attacks    Shoulder pain, right 04/18/2017      Objective:   Wts   06/11/2023        ***  10/01/2022            303   08/23/22 (!) 303 lb (137.4 kg)  07/31/22 276 lb 12.8 oz (125.6 kg)  11/15/21 (!) 302 lb 9.6 oz (137.3 kg)    Vital signs reviewed  06/11/2023  - Note at rest 02 sats  ***% on ***   General appearance:    ***    edentulous     ***           Assessment

## 2023-06-11 NOTE — Telephone Encounter (Signed)
Requested Prescriptions  Pending Prescriptions Disp Refills   DULoxetine (CYMBALTA) 60 MG capsule [Pharmacy Med Name: DULoxetine HCl 60 MG Oral Capsule Delayed Release Particles] 30 capsule 0    Sig: Take 1 capsule by mouth once daily     Psychiatry: Antidepressants - SNRI - duloxetine Failed - 06/10/2023  9:11 AM      Failed - Cr in normal range and within 360 days    Creat  Date Value Ref Range Status  04/26/2016 0.77 0.60 - 1.35 mg/dL Final   Creatinine, Ser  Date Value Ref Range Status  04/15/2023 0.68 (L) 0.76 - 1.27 mg/dL Final   Creatinine,U  Date Value Ref Range Status  08/05/2013 138.5 mg/dL Final    Comment:      Cutoff Values for Urine Drug Screen:         Drug Class           Cutoff (ng/mL)         Amphetamines            1000         Barbiturates             200         Cocaine Metabolites      300         Benzodiazepines          200         Methadone                300         Opiates                 2000         Phencyclidine             25         Propoxyphene             300         Marijuana Metabolites     50   For medical purposes only.         Failed - Last BP in normal range    BP Readings from Last 1 Encounters:  04/15/23 (!) 147/77         Passed - eGFR is 30 or above and within 360 days    GFR, Est African American  Date Value Ref Range Status  04/26/2016 >89 >=60 mL/min Final   GFR calc Af Amer  Date Value Ref Range Status  05/03/2020 >60 >60 mL/min Final   GFR, Est Non African American  Date Value Ref Range Status  04/26/2016 >89 >=60 mL/min Final   GFR calc non Af Amer  Date Value Ref Range Status  05/04/2020 >60 >60 mL/min Final   eGFR  Date Value Ref Range Status  04/15/2023 113 >59 mL/min/1.73 Final         Passed - Completed PHQ-2 or PHQ-9 in the last 360 days      Passed - Valid encounter within last 6 months    Recent Outpatient Visits           1 month ago Type 2 diabetes mellitus with other specified  complication, without long-term current use of insulin (HCC)   [facility] [place]. [facility] [place], [place] W, NP   10 months ago Type 2 diabetes mellitus with other specified complication, without long-term current use of insulin (HCC)   [facility] Of [place]. [facility] Claiborne Rigg, NP   1 year ago Primary hypertension   [facility] Dept Of [place]. [facility], [place] W, NP   1 year ago Type 2 diabetes mellitus with other specified complication, without long-term current use of insulin (HCC)   [facility] [place]. [facility] Claiborne Rigg, NP   2 years ago Primary hypertension   [facility] Dept Of [place]. [facility] Claiborne Rigg, NP       Future Appointments             In 1 month Claiborne Rigg, NP [facility] - A Dept Of [place]. [facility]

## 2023-06-20 ENCOUNTER — Emergency Department (HOSPITAL_BASED_OUTPATIENT_CLINIC_OR_DEPARTMENT_OTHER)
Admission: EM | Admit: 2023-06-20 | Discharge: 2023-06-20 | Disposition: A | Discharge status: Home / Self Care | Payer: Medicaid Other | Attending: Emergency Medicine | Admitting: Emergency Medicine

## 2023-06-20 ENCOUNTER — Emergency Department (HOSPITAL_BASED_OUTPATIENT_CLINIC_OR_DEPARTMENT_OTHER): Payer: Medicaid Other | Admitting: Radiology

## 2023-06-20 ENCOUNTER — Encounter (HOSPITAL_BASED_OUTPATIENT_CLINIC_OR_DEPARTMENT_OTHER): Payer: Self-pay

## 2023-06-20 ENCOUNTER — Other Ambulatory Visit: Payer: Self-pay

## 2023-06-20 DIAGNOSIS — Z7984 Long term (current) use of oral hypoglycemic drugs: Secondary | ICD-10-CM | POA: Diagnosis not present

## 2023-06-20 DIAGNOSIS — S86912A Strain of unspecified muscle(s) and tendon(s) at lower leg level, left leg, initial encounter: Secondary | ICD-10-CM | POA: Diagnosis not present

## 2023-06-20 DIAGNOSIS — S8392XA Sprain of unspecified site of left knee, initial encounter: Secondary | ICD-10-CM | POA: Diagnosis not present

## 2023-06-20 DIAGNOSIS — S86812A Strain of other muscle(s) and tendon(s) at lower leg level, left leg, initial encounter: Secondary | ICD-10-CM | POA: Diagnosis not present

## 2023-06-20 DIAGNOSIS — M25562 Pain in left knee: Secondary | ICD-10-CM | POA: Diagnosis present

## 2023-06-20 DIAGNOSIS — X501XXA Overexertion from prolonged static or awkward postures, initial encounter: Secondary | ICD-10-CM | POA: Diagnosis not present

## 2023-06-20 DIAGNOSIS — E1140 Type 2 diabetes mellitus with diabetic neuropathy, unspecified: Secondary | ICD-10-CM | POA: Insufficient documentation

## 2023-06-20 MED ORDER — OXYCODONE-ACETAMINOPHEN 5-325 MG PO TABS: 1.0000 | ORAL_TABLET | Freq: Four times a day (QID) | ORAL | 0 refills | Status: DC | PRN

## 2023-06-20 MED ORDER — OXYCODONE-ACETAMINOPHEN 5-325 MG PO TABS
1.0000 | ORAL_TABLET | Freq: Once | ORAL | Status: AC
Start: 2023-06-20 — End: 2023-06-20
  Administered 2023-06-20: 1 via ORAL
  Filled 2023-06-20: qty 1

## 2023-06-20 NOTE — ED Triage Notes (Signed)
Patient states he slipped on a ramp, feels like he may have hyperextended or dislocated his left knee, complains of pain.

## 2023-06-20 NOTE — ED Provider Notes (Signed)
EMERGENCY DEPARTMENT AT [facility] Provider Note   CSN: [id number] Arrival date & time: 06/20/23  0144     History  Chief Complaint  Patient presents with   Knee Injury    Preston [name] is a 52 y.o. male.  The history is provided by the patient.  Preston [name] is a 52 y.o. male who presents to the Emergency Department complaining of knee and hip.  He presents to the emergency department for evaluation of knee pain after he slipped walking down a ramp around 830.  He states that he hyperextended his left knee and felt that his patella moved out of place.  A friend caught him so he did not fall.  He has pain to the anterior knee and also pain that radiates up the back of his leg with swelling to his knee.  He is unable to bear weight on the leg secondary to pain in the knee.  He does have a history of diabetes, neuropathy at baseline with decreased sensation to his feet bilaterally.      Home Medications Prior to Admission medications   Medication Sig Start Date End Date Taking? Authorizing Provider  oxyCODONE-acetaminophen (PERCOCET/ROXICET) 5-325 MG tablet Take 1 tablet by mouth every 6 (six) hours as needed for severe pain (pain score 7-10). 06/20/23  Yes Tilden Fossa, MD  albuterol (PROVENTIL) (2.5 MG/3ML) 0.083% nebulizer solution Take 3 mLs (2.5 mg total) by nebulization every 6 (six) hours as needed for wheezing or shortness of breath. 11/25/22   Claiborne Rigg, NP  allopurinol (ZYLOPRIM) 300 MG tablet Take 1 tablet (300 mg total) by mouth daily. 04/15/23   Claiborne Rigg, NP  amLODipine (NORVASC) 10 MG tablet Take 1 tablet (10 mg total) by mouth at bedtime. 04/15/23   Claiborne Rigg, NP  atorvastatin (LIPITOR) 40 MG tablet Take 1 tablet (40 mg total) by mouth every evening. 04/15/23   Claiborne Rigg, NP  Blood Glucose Monitoring Suppl (ACCU-CHEK AVIVA PLUS) w/Device KIT 1 each by Does not apply route 3 (three) times daily. 11/07/16   Quentin Angst, MD  Blood Pressure Monitor DEVI Please provide patient with insurance approved blood pressure monitor I10.0 07/31/22   Claiborne Rigg, NP  busPIRone (BUSPAR) 15 MG tablet Take 1-2 tablets (15-30 mg total) by mouth 2 (two) times daily. For anxiety 04/15/23   Claiborne Rigg, NP  calcium carbonate (TUMS EX) 750 MG chewable tablet Chew 1 tablet (750 mg total) by mouth 3 (three) times daily as needed for heartburn. 11/25/22   Claiborne Rigg, NP  Colchicine (MITIGARE) 0.6 MG CAPS Take 0.6 mg by mouth daily as needed. M10.9 11/17/19   Claiborne Rigg, NP  DULoxetine (CYMBALTA) 60 MG capsule Take 1 capsule by mouth once daily 06/11/23   Claiborne Rigg, NP  famotidine (PEPCID) 20 MG tablet Take 1 tablet (20 mg total) by mouth daily. 04/15/23   Claiborne Rigg, NP  gabapentin (NEURONTIN) 300 MG capsule TAKE 3 CAPSULES BY MOUTH TWICE DAILY 02/11/23   Claiborne Rigg, NP  glucose blood (ACCU-CHEK AVIVA PLUS) test strip Use as instructed for 3 times daily testing of blood sugar. E11.9 11/17/19   Claiborne Rigg, NP  hydrOXYzine (ATARAX) 50 MG tablet Take 1 tablet (50 mg total) by mouth 3 (three) times daily as needed. for anxiety 04/15/23   Claiborne Rigg, NP  metFORMIN (GLUCOPHAGE) 500 MG tablet Take 2 tablets (1,000 mg total) by mouth  2 (two) times daily with a meal. TAKE 2 TABLETS BY MOUTH TWICE DAILY WITH  A  MEAL. OFFICE VISIT NEEDED FOR ADDITIONAL REFILLS 04/15/23   Claiborne Rigg, NP  metoprolol tartrate (LOPRESSOR) 50 MG tablet Take 1 tablet (50 mg total) by mouth 2 (two) times daily. 04/15/23   Claiborne Rigg, NP  omeprazole (PRILOSEC) 40 MG capsule Take 1 capsule (40 mg total) by mouth daily. 07/31/22   Claiborne Rigg, NP  SYMBICORT 80-4.5 MCG/ACT inhaler Inhale 2 puffs into the lungs in the morning and at bedtime. 03/11/23   Nyoka Cowden, MD  valsartan (DIOVAN) 40 MG tablet Take 1 tablet (40 mg total) by mouth daily. 07/31/22   Claiborne Rigg, NP  VENTOLIN HFA 108 (90 Base)  MCG/ACT inhaler INHALE 1 TO 2 PUFFS BY MOUTH EVERY 6 HOURS AS NEEDED FOR WHEEZING FOR SHORTNESS OF BREATH 03/19/23   Claiborne Rigg, NP      Allergies    Morphine, Ace inhibitors, and Adhesive [tape]    Review of Systems   Review of Systems  All other systems reviewed and are negative.   Physical Exam Updated Vital Signs BP 135/81   Pulse 94   Temp 98.4 F (36.9 C)   Resp 16   Ht 6\' 3"  (1.905 m)   Wt (!) 137.9 kg   SpO2 95%   BMI 38.00 kg/m  Physical Exam Vitals and nursing note reviewed.  Constitutional:      Appearance: He is well-developed.  HENT:     Head: Normocephalic and atraumatic.  Cardiovascular:     Rate and Rhythm: Normal rate and regular rhythm.  Pulmonary:     Effort: Pulmonary effort is normal. No respiratory distress.  Musculoskeletal:     Comments: There is soft tissue swelling throughout the left knee.  There is tenderness to palpation over the patella, medial joint line.  He is able to extend the knee but does have discomfort with extension.  2+ DP pulses.  There is no tenderness over the hip.  Skin:    General: Skin is warm and dry.  Neurological:     Mental Status: He is alert and oriented to person, place, and time.  Psychiatric:        Behavior: Behavior normal.     ED Results / Procedures / Treatments   Labs (all labs ordered are listed, but only abnormal results are displayed) Labs Reviewed - No data to display  EKG None  Radiology DG Knee Complete 4 Views Left  Result Date: 06/20/2023 CLINICAL DATA:  Hyperextension injury, anterior knee pain EXAM: LEFT KNEE - COMPLETE 4+ VIEW COMPARISON:  None Available. FINDINGS: There is a moderate joint effusion. No acute bony abnormality. Specifically, no fracture, subluxation, or dislocation. Joint spaces maintained. Early spurring. IMPRESSION: Moderate joint effusion.  No acute bony abnormality. Electronically Signed   By: Charlett Nose M.D.   On: 06/20/2023 02:36    Procedures Procedures     Medications Ordered in ED Medications  oxyCODONE-acetaminophen (PERCOCET/ROXICET) 5-325 MG per tablet 1 tablet (has no administration in time range)  oxyCODONE-acetaminophen (PERCOCET/ROXICET) 5-325 MG per tablet 1 tablet (1 tablet Oral Given 06/20/23 0228)    ED Course/ Medical Decision Making/ A&P                                 Medical Decision Making Amount and/or Complexity of Data Reviewed Radiology: ordered.  Risk  Prescription drug management.   Patient here for evaluation of left knee pain.  He did have a hyperextension injury.  Based off of history, examination there was no true dislocation.  He does have an effusion on exam and on plain films.  Plain films were negative for fracture, images personally reviewed and interpreted, agree with radiologist interpretation.  Will place in knee immobilizer.  Will provide crutches for weightbearing as tolerated.  Discussed orthopedics follow-up.  Will provide a short course of pain relievers.        Final Clinical Impression(s) / ED Diagnoses Final diagnoses:  Strain of left knee, initial encounter    Rx / DC Orders ED Discharge Orders          Ordered    oxyCODONE-acetaminophen (PERCOCET/ROXICET) 5-325 MG tablet  Every 6 hours PRN        06/20/23 0304              Tilden Fossa, MD 06/20/23 0310

## 2023-06-20 NOTE — ED Notes (Signed)
Pt states he almost fell, going down a ramp and caught himself and feels he hyper-extended his left knee

## 2023-06-21 ENCOUNTER — Ambulatory Visit: Payer: Self-pay

## 2023-06-21 NOTE — Telephone Encounter (Signed)
Chief Complaint: Knee pain/swelling Symptoms: 8/10 pain, hard to walk Frequency: Onset Wednesday after fall Pertinent Negatives: Patient denies other symptoms Disposition: [] ED /[] Urgent Care (no appt availability in office) / [x] Appointment(In office/virtual)/ []  [facility]/ [] Home Care/ [x] Refused Recommended Disposition /[] [facility]/ []  Follow-up with PCP Additional Notes: Victorino Dike, on DPR, called and says the patient fell on Wednesday and had to go to the ED. He was told some ligaments are torn in the knee, put a brace on the knee and sent him home with Oxycodone. She says he called and says the pain is really bad and that he can barely walk on it. She is wanting some prednisone called in to the pharmacy. Advised he would need an OV for evaluation due to this is a new issue. She asked if I could just send the message to [facility] because she knows him and would call something in for him. The swelling is not any better is why she asked for prednisone. I asked the patient, who as also on the line, if he's been referred to orthopedic. He says he was told to call and make an appointment, but he hasn't done it yet. Advised I will send this to [facility] and someone will call with her recommendation.   Reason for Disposition  [1] SEVERE pain (e.g., excruciating, unable to walk) AND [2] not improved after 2 hours of pain medicine  Answer Assessment - Initial Assessment Questions 1. LOCATION and RADIATION: "Where is the pain located?"      Left knee 2. QUALITY: "What does the pain feel like?"  (e.g., sharp, dull, aching, burning)     Just says it hurts, sharp when trying to walk 3. SEVERITY: "How bad is the pain?" "What does it keep you from doing?"   (Scale 1-10; or mild, moderate, severe)   -  MILD (1-3): doesn't interfere with normal activities    -  MODERATE (4-7): interferes with normal activities (e.g., work or school) or awakens from sleep, limping    -  SEVERE (8-10):  excruciating pain, unable to do any normal activities, unable to walk     Severe 4. ONSET: "When did the pain start?" "Does it come and go, or is it there all the time?"     After falling on Wednesday evening 5. SETTING: "Has there been any recent work, exercise or other activity that involved that part of the body?"      Slipped and fell  6. AGGRAVATING FACTORS: "What makes the knee pain worse?" (e.g., walking, climbing stairs, running)     Walking 7. ASSOCIATED SYMPTOMS: "Is there any swelling or redness of the knee?"     Swelling 8. OTHER SYMPTOMS: "Do you have any other symptoms?" (e.g., chest pain, difficulty breathing, fever, calf pain)     No  Protocols used: Knee Pain-A-AH

## 2023-06-24 ENCOUNTER — Other Ambulatory Visit: Payer: Self-pay | Admitting: Nurse Practitioner

## 2023-06-24 DIAGNOSIS — M25561 Pain in right knee: Secondary | ICD-10-CM

## 2023-06-24 MED ORDER — PREDNISONE 20 MG PO TABS: 20.0000 mg | ORAL_TABLET | Freq: Every day | ORAL | 0 refills | Status: AC

## 2023-06-24 NOTE — Telephone Encounter (Signed)
Prednisone sent to [facility].  Needs to follow-up with Ortho as soon as possible

## 2023-06-24 NOTE — Telephone Encounter (Signed)
Call placed to patient unable to reach message left. That prednisone has been sent to his pharmacy and per PCP is reccommended that he contact Ortho as soon as possible

## 2023-07-03 ENCOUNTER — Institutional Professional Consult (permissible substitution) (INDEPENDENT_AMBULATORY_CARE_PROVIDER_SITE_OTHER): Payer: Medicaid Other

## 2023-07-08 ENCOUNTER — Ambulatory Visit: Payer: Self-pay | Admitting: Surgery

## 2023-07-08 DIAGNOSIS — C642 Malignant neoplasm of left kidney, except renal pelvis: Secondary | ICD-10-CM | POA: Diagnosis not present

## 2023-07-08 DIAGNOSIS — M6208 Separation of muscle (nontraumatic), other site: Secondary | ICD-10-CM | POA: Diagnosis not present

## 2023-07-08 DIAGNOSIS — K430 Incisional hernia with obstruction, without gangrene: Secondary | ICD-10-CM | POA: Diagnosis present

## 2023-07-08 DIAGNOSIS — N2889 Other specified disorders of kidney and ureter: Secondary | ICD-10-CM | POA: Diagnosis not present

## 2023-07-08 DIAGNOSIS — K432 Incisional hernia without obstruction or gangrene: Secondary | ICD-10-CM | POA: Diagnosis not present

## 2023-07-08 DIAGNOSIS — Z720 Tobacco use: Secondary | ICD-10-CM | POA: Diagnosis not present

## 2023-07-08 DIAGNOSIS — E66812 Obesity, class 2: Secondary | ICD-10-CM | POA: Diagnosis not present

## 2023-07-09 ENCOUNTER — Telehealth: Payer: Self-pay

## 2023-07-09 NOTE — Telephone Encounter (Signed)
Left message to call back to scheduled IN OFFICE APPT FOR PREOP CLEARANCE.

## 2023-07-09 NOTE — Telephone Encounter (Signed)
...     Pre-operative Risk Assessment    Patient Name: Preston [name]  DOB: 03-17-1971 MRN: [id number]      Request for Surgical Clearance    Procedure:   INCISIONAL HERNIA SURGERY  Date of Surgery:  Clearance TBD                                 Surgeon:  DR Karie Soda Surgeon's Group or Practice Name:  [facility] Phone number:  [phone] Fax number:  [phone]   Type of Clearance Requested:   - Medical    Type of Anesthesia:  General    Additional requests/questions:   LAST O/V 05/15/21, NO NEW APPT  Signed, Renee Ramus   07/09/2023, 12:03 PM

## 2023-07-09 NOTE — Telephone Encounter (Signed)
Primary Cardiologist:Mark Anne Fu, MD  Chart reviewed as part of pre-operative protocol coverage. Because of Preston [name]'s past medical history and time since last visit, he/she will require a follow-up visit in order to better assess preoperative cardiovascular risk.  Pre-op covering staff: - Please schedule appointment and call patient to inform them. - Please contact requesting surgeon's office via preferred method (i.e, phone, fax) to inform them of need for appointment prior to surgery.  Levi Aland, NP-C  07/09/2023, 12:26 PM [address] Office [phone] Fax [phone]

## 2023-07-10 ENCOUNTER — Ambulatory Visit: Payer: Self-pay | Admitting: Surgery

## 2023-07-11 NOTE — Telephone Encounter (Signed)
2nd attempt to reach the pt to schedule IN OFFICE PRE OP APPT.  Will update the requesting office the pt needs to call the office to schedule in office appt.

## 2023-07-15 ENCOUNTER — Ambulatory Visit: Payer: Medicaid Other | Attending: Nurse Practitioner | Admitting: Nurse Practitioner

## 2023-07-15 ENCOUNTER — Encounter: Payer: Self-pay | Admitting: Nurse Practitioner

## 2023-07-15 VITALS — BP 156/88 | HR 78 | Resp 20 | Ht 75.0 in | Wt 291.4 lb

## 2023-07-15 DIAGNOSIS — Z7984 Long term (current) use of oral hypoglycemic drugs: Secondary | ICD-10-CM | POA: Diagnosis not present

## 2023-07-15 DIAGNOSIS — E785 Hyperlipidemia, unspecified: Secondary | ICD-10-CM

## 2023-07-15 DIAGNOSIS — F411 Generalized anxiety disorder: Secondary | ICD-10-CM

## 2023-07-15 DIAGNOSIS — M1A9XX0 Chronic gout, unspecified, without tophus (tophi): Secondary | ICD-10-CM | POA: Diagnosis not present

## 2023-07-15 DIAGNOSIS — K219 Gastro-esophageal reflux disease without esophagitis: Secondary | ICD-10-CM

## 2023-07-15 DIAGNOSIS — M25562 Pain in left knee: Secondary | ICD-10-CM | POA: Diagnosis not present

## 2023-07-15 DIAGNOSIS — E119 Type 2 diabetes mellitus without complications: Secondary | ICD-10-CM

## 2023-07-15 DIAGNOSIS — J4521 Mild intermittent asthma with (acute) exacerbation: Secondary | ICD-10-CM

## 2023-07-15 DIAGNOSIS — Z1211 Encounter for screening for malignant neoplasm of colon: Secondary | ICD-10-CM

## 2023-07-15 DIAGNOSIS — E1142 Type 2 diabetes mellitus with diabetic polyneuropathy: Secondary | ICD-10-CM | POA: Diagnosis not present

## 2023-07-15 DIAGNOSIS — E1169 Type 2 diabetes mellitus with other specified complication: Secondary | ICD-10-CM | POA: Diagnosis not present

## 2023-07-15 DIAGNOSIS — I10 Essential (primary) hypertension: Secondary | ICD-10-CM | POA: Diagnosis not present

## 2023-07-15 MED ORDER — METOPROLOL TARTRATE 50 MG PO TABS: 50.0000 mg | ORAL_TABLET | Freq: Two times a day (BID) | ORAL | 1 refills | Status: DC

## 2023-07-15 MED ORDER — BUSPIRONE HCL 15 MG PO TABS: 15.0000 mg | ORAL_TABLET | Freq: Two times a day (BID) | ORAL | 1 refills | Status: DC

## 2023-07-15 MED ORDER — GABAPENTIN 300 MG PO CAPS: 900.0000 mg | ORAL_CAPSULE | Freq: Two times a day (BID) | ORAL | 1 refills | Status: DC

## 2023-07-15 MED ORDER — ATORVASTATIN CALCIUM 40 MG PO TABS: 40.0000 mg | ORAL_TABLET | Freq: Every evening | ORAL | 1 refills | Status: DC

## 2023-07-15 MED ORDER — DULOXETINE HCL 60 MG PO CPEP: 60.0000 mg | ORAL_CAPSULE | Freq: Every day | ORAL | 1 refills | Status: DC

## 2023-07-15 MED ORDER — OMEPRAZOLE 40 MG PO CPDR: 40.0000 mg | DELAYED_RELEASE_CAPSULE | Freq: Every day | ORAL | 1 refills | Status: DC

## 2023-07-15 MED ORDER — ACCU-CHEK AVIVA PLUS VI STRP: ORAL_STRIP | 2 refills | Status: DC

## 2023-07-15 MED ORDER — ALLOPURINOL 300 MG PO TABS: 300.0000 mg | ORAL_TABLET | Freq: Every day | ORAL | 1 refills | Status: DC

## 2023-07-15 MED ORDER — METFORMIN HCL 500 MG PO TABS: 1000.0000 mg | ORAL_TABLET | Freq: Two times a day (BID) | ORAL | 1 refills | Status: DC

## 2023-07-15 MED ORDER — SYMBICORT 80-4.5 MCG/ACT IN AERO: 2.0000 | INHALATION_SPRAY | Freq: Two times a day (BID) | RESPIRATORY_TRACT | 3 refills | Status: DC

## 2023-07-15 MED ORDER — VALSARTAN 40 MG PO TABS: 40.0000 mg | ORAL_TABLET | Freq: Every day | ORAL | 3 refills | Status: DC

## 2023-07-15 MED ORDER — PREDNISONE 20 MG PO TABS: 40.0000 mg | ORAL_TABLET | Freq: Every day | ORAL | 0 refills | Status: AC

## 2023-07-15 MED ORDER — FAMOTIDINE 20 MG PO TABS: 20.0000 mg | ORAL_TABLET | Freq: Every evening | ORAL | 1 refills | Status: DC

## 2023-07-15 MED ORDER — HYDROXYZINE HCL 50 MG PO TABS: 50.0000 mg | ORAL_TABLET | Freq: Three times a day (TID) | ORAL | 3 refills | Status: DC | PRN

## 2023-07-15 MED ORDER — FAMOTIDINE 20 MG PO TABS: 20.0000 mg | ORAL_TABLET | Freq: Every day | ORAL | 1 refills | Status: DC

## 2023-07-15 MED ORDER — AMLODIPINE BESYLATE 10 MG PO TABS: 10.0000 mg | ORAL_TABLET | Freq: Every day | ORAL | 1 refills | Status: DC

## 2023-07-15 MED ORDER — COLCHICINE 0.6 MG PO CAPS: 0.6000 mg | ORAL_CAPSULE | Freq: Every day | ORAL | 3 refills | Status: DC | PRN

## 2023-07-15 NOTE — Progress Notes (Signed)
Knee injury from recent stumble

## 2023-07-15 NOTE — Progress Notes (Signed)
Assessment & Plan:  Preston "Deniece Portela" was seen today for medical management of chronic issues.  Diagnoses and all orders for this visit:  Diabetes mellitus treated with oral medication (HCC) -     Ambulatory referral to Ophthalmology -     metFORMIN (GLUCOPHAGE) 500 MG tablet; Take 2 tablets (1,000 mg total) by mouth 2 (two) times daily with a meal. TAKE 2 TABLETS BY MOUTH TWICE DAILY WITH  A  MEAL. OFFICE VISIT NEEDED FOR ADDITIONAL REFILLS -     glucose blood (ACCU-CHEK AVIVA PLUS) test strip; Use as instructed for 3 times daily testing of blood sugar. E11.9 -     CMP14+EGFR Continue blood sugar control as discussed in office today, low carbohydrate diet, and regular physical exercise as tolerated, 150 minutes per week (30 min each day, 5 days per week, or 50 min 3 days per week). Keep blood sugar logs with fasting goal of 90-130 mg/dl, post prandial (after you eat) less than 180.  For Hypoglycemia: BS <60 and Hyperglycemia BS >400; contact the clinic ASAP. Annual eye exams and foot exams are recommended.    Primary hypertension -     valsartan (DIOVAN) 40 MG tablet; Take 1 tablet (40 mg total) by mouth daily. -     metoprolol tartrate (LOPRESSOR) 50 MG tablet; Take 1 tablet (50 mg total) by mouth 2 (two) times daily. -     amLODipine (NORVASC) 10 MG tablet; Take 1 tablet (10 mg total) by mouth at bedtime. Continue all antihypertensives as prescribed.  Reminded to bring in blood pressure log for follow  up appointment.  RECOMMENDATIONS: DASH/Mediterranean Diets are healthier choices for HTN.     Colon cancer screening -     Ambulatory referral to Gastroenterology  Acute pain of left knee -     Ambulatory referral to Orthopedic Surgery Wear brace as instructed Elevate right leg as needed for swelling.  Take medication as needed for pain  GERD without esophagitis Symptoms well controlled -     famotidine (PEPCID) 20 MG tablet; Take 1 tablet (20 mg total) by mouth daily. -      omeprazole (PRILOSEC) 40 MG capsule; Take 1 capsule (40 mg total) by mouth daily. INSTRUCTIONS: Avoid GERD Triggers: acidic, spicy or fried foods, caffeine, coffee, sodas,  alcohol and chocolate.    GAD Mood is stable -     hydrOXYzine (ATARAX) 50 MG tablet; Take 1 tablet (50 mg total) by mouth 3 (three) times daily as needed. for anxiety -     busPIRone (BUSPAR) 15 MG tablet; Take 1-2 tablets (15-30 mg total) by mouth 2 (two) times daily. For anxiety  Diabetic polyneuropathy associated with type 2 diabetes mellitus (HCC) -     gabapentin (NEURONTIN) 300 MG capsule; Take 3 capsules (900 mg total) by mouth 2 (two) times daily. -     DULoxetine (CYMBALTA) 60 MG capsule; Take 1 capsule (60 mg total) by mouth daily.  Dyslipidemia, goal LDL below 70 -     atorvastatin (LIPITOR) 40 MG tablet; Take 1 tablet (40 mg total) by mouth every evening. INSTRUCTIONS: Work on a low fat, heart healthy diet and participate in regular aerobic exercise program by working out at least 150 minutes per week; 5 days a week-30 minutes per day. Avoid red meat/beef/steak,  fried foods. junk foods, sodas, sugary drinks, unhealthy snacking, alcohol and smoking.  Drink at least 80 oz of water per day and monitor your carbohydrate intake daily.    Chronic gout without  tophus, unspecified cause, unspecified site Denies any symptoms of gout flare today -     allopurinol (ZYLOPRIM) 300 MG tablet; Take 1 tablet (300 mg total) by mouth daily. -     Colchicine (MITIGARE) 0.6 MG CAPS; Take 1 capsule (0.6 mg total) by mouth daily as needed. M10.9  Mild intermittent asthma with acute exacerbation -     SYMBICORT 80-4.5 MCG/ACT inhaler; Inhale 2 puffs into the lungs in the morning and at bedtime. -     predniSONE (DELTASONE) 20 MG tablet; Take 2 tablets (40 mg total) by mouth daily with breakfast for 18 days.    Patient has been counseled on age-appropriate routine health concerns for screening and prevention. These are reviewed  and up-to-date. Referrals have been placed accordingly. Immunizations are up-to-date or declined.    Subjective:   Chief Complaint  Patient presents with   Medical Management of Chronic Issues    Preston [name] 52 y.o. male presents to office today for follow up to HTN.   He has a past medical history of Anxiety, Asthma,  CHF  Chronic lower back pain, COPD, GERD, Gout, History of Clostridium difficile infection (2014), Hypercholesterolemia, Hypertension, DM, Osteoarthritis, Panic attacks, and Shoulder pain, right (04/18/2017).    HTN  Blood pressure is elevated today.  He is currently prescribed losartan 40 mg daily, Lopressor 50 mg twice daily and amlodipine 10 mg daily. BP Readings from Last 3 Encounters:  07/15/23 (!) 156/88  06/20/23 (!) 170/96  04/15/23 (!) 147/77      Left knee pain He was seen in the ED on 06-20-2023 for evaluation of left knee pain for  hyperextension injury.  Based off of history, examination there was no true dislocation.  He has an effusion on exam and on plain films.  Plain films were negative for fracture, images personally reviewed and interpreted, agree with radiologist interpretation.  He was given a knee immobilizer and  crutches for weightbearing as tolerated.   Today he reports persistent left knee pain and swelling which has not resolved. Current pain 6-7/10. Will place ortho referral today.    He is wheezing on exam today. Mild shortness of breath with activity. Needs refill of nebs and SABA    Review of Systems  Constitutional:  Negative for fever, malaise/fatigue and weight loss.  HENT: Negative.  Negative for nosebleeds.   Eyes: Negative.  Negative for blurred vision, double vision and photophobia.  Respiratory: Negative.  Negative for cough and shortness of breath.   Cardiovascular: Negative.  Negative for chest pain, palpitations and leg swelling.  Gastrointestinal: Negative.  Negative for heartburn, nausea and vomiting.   Musculoskeletal:  Positive for joint pain. Negative for myalgias.  Neurological: Negative.  Negative for dizziness, focal weakness, seizures and headaches.  Psychiatric/Behavioral: Negative.  Negative for suicidal ideas.     Past Medical History:  Diagnosis Date   Anxiety    Asthma    daily inhaler   Cancer (HCC)    pt. reports diagnosed with skin cancer on face/uncertain type but he reports may be basal cell, pending surgery to remove 09/03/18   CHF (congestive heart failure) (HCC)    no cardiologist   Chronic cough    states due to COPD   Chronic lower back pain    COPD (chronic obstructive pulmonary disease) (HCC)    no home O2   Dyspnea    with humidity and high pollen count   Edentulous    upper gum   GERD (gastroesophageal  reflux disease)    Gout    History of Clostridium difficile infection 2014   Hypercholesterolemia    Hypertension    states under control with med., has been on med. x 4 yr.   Insulin dependent diabetes mellitus    type 2    Osteoarthritis    bilat. hip   Panic attacks    Shoulder pain, right 04/18/2017    Past Surgical History:  Procedure Laterality Date   COLON SURGERY     14 inches of colon removed   COLONOSCOPY WITH PROPOFOL N/A 04/01/2013   Procedure: COLONOSCOPY WITH PROPOFOL;  Surgeon: Willis Modena, MD;  Location: [facility];  Service: Endoscopy;  Laterality: N/A;   FLEXIBLE SIGMOIDOSCOPY N/A 02/28/2013   Procedure: FLEXIBLE SIGMOIDOSCOPY;  Surgeon: Vertell Novak., MD;  Location: [facility] ENDOSCOPY;  Service: Endoscopy;  Laterality: N/A;   LAPAROSCOPIC APPENDECTOMY  07/05/2009   LAPAROSCOPIC LOW ANTERIOR RESECTION N/A 07/08/2013   Procedure: LAPAROSCOPIC LOW ANTERIOR RESECTION WITH TAKEDOWN OF SPLENIC FLEXURE.;  Surgeon: Ernestene Mention, MD;  Location: [facility] OR;  Service: General;  Laterality: N/A;   MANDIBLE SURGERY Left 2010   "dry skin pocket cut out"   MULTIPLE EXTRACTIONS WITH ALVEOLOPLASTY N/A 04/24/2017   Procedure: SURGICAL  REMOVAL OF TEETH NUMBERS `7 20-29 AND 31;  Surgeon: Vivia Ewing, DMD;  Location: [facility];  Service: Oral Surgery;  Laterality: N/A;   ROBOTIC ASSITED PARTIAL NEPHRECTOMY Left 05/02/2020   Procedure: XI ROBOTIC ASSITED LAPAROSCOPIC PARTIAL NEPHRECTOMY;  Surgeon: Heloise Purpura, MD;  Location: [facility] ORS;  Service: Urology;  Laterality: Left;   TOTAL HIP ARTHROPLASTY Left 06/25/2016   Procedure: TOTAL HIP ARTHROPLASTY ANTERIOR APPROACH;  Surgeon: Gean Birchwood, MD;  Location: [facility] OR;  Service: Orthopedics;  Laterality: Left;   TOTAL HIP ARTHROPLASTY Right 09/10/2016   Procedure: TOTAL HIP ARTHROPLASTY ANTERIOR APPROACH;  Surgeon: Gean Birchwood, MD;  Location: [facility] OR;  Service: Orthopedics;  Laterality: Right;    Family History  Problem Relation Age of Onset   Cancer Father        lymphoma   Heart attack Mother     Social History Reviewed with no changes to be made today.   Outpatient Medications Prior to Visit  Medication Sig Dispense Refill   albuterol (PROVENTIL) (2.5 MG/3ML) 0.083% nebulizer solution Take 3 mLs (2.5 mg total) by nebulization every 6 (six) hours as needed for wheezing or shortness of breath. 150 mL 1   Blood Glucose Monitoring Suppl (ACCU-CHEK AVIVA PLUS) w/Device KIT 1 each by Does not apply route 3 (three) times daily. 1 kit 0   Blood Pressure Monitor DEVI Please provide patient with insurance approved blood pressure monitor I10.0 1 each 0   calcium carbonate (TUMS EX) 750 MG chewable tablet Chew 1 tablet (750 mg total) by mouth 3 (three) times daily as needed for heartburn. 90 tablet 3   VENTOLIN HFA 108 (90 Base) MCG/ACT inhaler INHALE 1 TO 2 PUFFS BY MOUTH EVERY 6 HOURS AS NEEDED FOR WHEEZING FOR SHORTNESS OF BREATH 18 g 0   allopurinol (ZYLOPRIM) 300 MG tablet Take 1 tablet (300 mg total) by mouth daily. 90 tablet 1   amLODipine (NORVASC) 10 MG tablet Take 1 tablet (10 mg total) by mouth at bedtime. 90 tablet 1   atorvastatin (LIPITOR) 40 MG tablet Take 1  tablet (40 mg total) by mouth every evening. 90 tablet 1   busPIRone (BUSPAR) 15 MG tablet Take 1-2 tablets (15-30 mg total) by mouth 2 (two) times  daily. For anxiety 180 tablet 1   Colchicine (MITIGARE) 0.6 MG CAPS Take 0.6 mg by mouth daily as needed. M10.9 30 capsule 3   DULoxetine (CYMBALTA) 60 MG capsule Take 1 capsule by mouth once daily 30 capsule 0   famotidine (PEPCID) 20 MG tablet Take 1 tablet (20 mg total) by mouth daily. 180 tablet 1   gabapentin (NEURONTIN) 300 MG capsule TAKE 3 CAPSULES BY MOUTH TWICE DAILY 540 capsule 1   glucose blood (ACCU-CHEK AVIVA PLUS) test strip Use as instructed for 3 times daily testing of blood sugar. E11.9 300 each 2   hydrOXYzine (ATARAX) 50 MG tablet Take 1 tablet (50 mg total) by mouth 3 (three) times daily as needed. for anxiety 90 tablet 3   metFORMIN (GLUCOPHAGE) 500 MG tablet Take 2 tablets (1,000 mg total) by mouth 2 (two) times daily with a meal. TAKE 2 TABLETS BY MOUTH TWICE DAILY WITH  A  MEAL. OFFICE VISIT NEEDED FOR ADDITIONAL REFILLS 360 tablet 1   metoprolol tartrate (LOPRESSOR) 50 MG tablet Take 1 tablet (50 mg total) by mouth 2 (two) times daily. 180 tablet 1   omeprazole (PRILOSEC) 40 MG capsule Take 1 capsule (40 mg total) by mouth daily. 90 capsule 1   SYMBICORT 80-4.5 MCG/ACT inhaler Inhale 2 puffs into the lungs in the morning and at bedtime. 11 g 3   valsartan (DIOVAN) 40 MG tablet Take 1 tablet (40 mg total) by mouth daily. 90 tablet 3   oxyCODONE-acetaminophen (PERCOCET/ROXICET) 5-325 MG tablet Take 1 tablet by mouth every 6 (six) hours as needed for severe pain (pain score 7-10). (Patient not taking: Reported on 07/15/2023) 8 tablet 0   No facility-administered medications prior to visit.    Allergies  Allergen Reactions   Morphine Itching and Nausea And Vomiting   Ace Inhibitors Cough   Adhesive [Tape] Itching and Rash       Objective:    BP (!) 156/88 (BP Location: Left Arm, Patient Position: Sitting, Cuff Size:  Large)   Pulse 78   Resp 20   Ht 6\' 3"  (1.905 m)   Wt 291 lb 6.4 oz (132.2 kg)   SpO2 98%   BMI 36.42 kg/m  Wt Readings from Last 3 Encounters:  07/15/23 291 lb 6.4 oz (132.2 kg)  06/20/23 (!) 304 lb (137.9 kg)  04/15/23 291 lb (132 kg)    Physical Exam Vitals and nursing note reviewed.  Constitutional:      Appearance: He is well-developed.  HENT:     Head: Normocephalic and atraumatic.  Cardiovascular:     Rate and Rhythm: Normal rate and regular rhythm.     Heart sounds: Normal heart sounds. No murmur heard.    No friction rub. No gallop.  Pulmonary:     Effort: Pulmonary effort is normal. No tachypnea or respiratory distress.     Breath sounds: Examination of the right-lower field reveals wheezing. Examination of the left-lower field reveals wheezing. Wheezing present. No decreased breath sounds, rhonchi or rales.  Chest:     Chest wall: No tenderness.  Abdominal:     General: Bowel sounds are normal.     Palpations: Abdomen is soft.  Musculoskeletal:        General: Normal range of motion.     Cervical back: Normal range of motion.  Skin:    General: Skin is warm and dry.  Neurological:     Mental Status: He is alert and oriented to person, place, and time.  Coordination: Coordination normal.  Psychiatric:        Behavior: Behavior normal. Behavior is cooperative.        Thought Content: Thought content normal.        Judgment: Judgment normal.          Patient has been counseled extensively about nutrition and exercise as well as the importance of adherence with medications and regular follow-up. The patient was given clear instructions to go to ER or return to medical center if symptoms don't improve, worsen or new problems develop. The patient verbalized understanding.   Follow-up: Return in about 3 months (around 10/13/2023).   Claiborne Rigg, FNP-BC [facility] [place], [place] [phone]   07/15/2023, 5:24  PM

## 2023-07-15 NOTE — Patient Instructions (Addendum)
[facility]  [address] [place], [place] [address] PH# [phone]   [facility] [address] [place], [place], [place] [address] Phone: [phone]  [facility]  [address]  ph # [phone]

## 2023-07-15 NOTE — Telephone Encounter (Signed)
Pt has been scheduled in office appt preop 08/12/23 with Jari Favre, PAC.

## 2023-07-16 ENCOUNTER — Other Ambulatory Visit: Payer: Self-pay

## 2023-07-16 ENCOUNTER — Other Ambulatory Visit: Payer: Self-pay | Admitting: Pharmacist

## 2023-07-16 LAB — CMP14+EGFR
ALT: 40 [IU]/L (ref 0–44)
AST: 20 [IU]/L (ref 0–40)
Albumin: 4.5 g/dL (ref 3.8–4.9)
Alkaline Phosphatase: 81 [IU]/L (ref 44–121)
BUN/Creatinine Ratio: 9 (ref 9–20)
BUN: 7 mg/dL (ref 6–24)
Bilirubin Total: 0.2 mg/dL (ref 0.0–1.2)
CO2: 21 mmol/L (ref 20–29)
Calcium: 9.9 mg/dL (ref 8.7–10.2)
Chloride: 95 mmol/L — ABNORMAL LOW (ref 96–106)
Creatinine, Ser: 0.78 mg/dL (ref 0.76–1.27)
Globulin, Total: 2 g/dL (ref 1.5–4.5)
Glucose: 123 mg/dL — ABNORMAL HIGH (ref 70–99)
Potassium: 5.2 mmol/L (ref 3.5–5.2)
Sodium: 134 mmol/L (ref 134–144)
Total Protein: 6.5 g/dL (ref 6.0–8.5)
eGFR: 108 mL/min/{1.73_m2} (ref >59)

## 2023-07-16 MED ORDER — COLCHICINE 0.6 MG PO TABS: ORAL_TABLET | ORAL | 3 refills | Status: DC

## 2023-07-22 DIAGNOSIS — M25562 Pain in left knee: Secondary | ICD-10-CM | POA: Diagnosis not present

## 2023-08-06 DIAGNOSIS — Z905 Acquired absence of kidney: Secondary | ICD-10-CM | POA: Diagnosis not present

## 2023-08-06 DIAGNOSIS — K439 Ventral hernia without obstruction or gangrene: Secondary | ICD-10-CM | POA: Diagnosis not present

## 2023-08-06 DIAGNOSIS — R160 Hepatomegaly, not elsewhere classified: Secondary | ICD-10-CM | POA: Diagnosis not present

## 2023-08-06 DIAGNOSIS — C642 Malignant neoplasm of left kidney, except renal pelvis: Secondary | ICD-10-CM | POA: Diagnosis not present

## 2023-08-06 DIAGNOSIS — N281 Cyst of kidney, acquired: Secondary | ICD-10-CM | POA: Diagnosis not present

## 2023-08-11 NOTE — Progress Notes (Signed)
 Cardiology Office Note:  .   Date:  08/12/2023  ID:  Bertrum LELON Applethwaite, DOB 1970-09-05, MRN [id number] PCP: Theotis Haze LELON, NP  [facility] Providers  History of Present Illness: DEMETRES PROCHNOW is a 53 y.o. 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  He was last seen October 2022 by Dr. Jeffrie and at that time he was overall doing well.  He had a stress test that showed chronotropic competence but was able to increase his heart rate just fine.  He did not have any further cough related syncope.  He was told to follow-up on an as-needed basis.  We received a clearance request for an incisional hernia surgery and he is here for cardiac clearance. The patient, with a history of hypertension, renal cancer, and neuropathy, presents for preoperative clearance. The patient's hypertension has been a long-standing issue, managed with amlodipine , metoprolol , and valsartan . The patient reports that the current blood pressure readings are typical and considers them to be good. The patient has not experienced any recent episodes of passing out due to coughing, a problem that occurred in the past.  The patient's renal cancer history involves a previous surgery to remove a cancerous spot on the left kidney, preserving most of the kidney. Currently, the patient is due for another surgery to remove a newly found spot on the right kidney. The patient's kidney function has been good, and the cancer was discovered incidentally during a CT scan for a different issue.  He also suffers from neuropathy in the feet, which is managed with gabapentin . The patient reports that the medication helps, although it takes some time to take effect. The patient also takes Lipitor  for cholesterol management, as cholesterol levels were high in the past. The patient's cholesterol levels from the previous year were good.  Discussed the use of AI scribe software for clinical note transcription with the patient, who gave verbal consent to proceed.  Reports no shortness of breath nor dyspnea on exertion. Reports no chest pain, pressure, or tightness. No edema, orthopnea, PND. Reports no palpitations.     ROS: Pertinent ROS in HPI  Studies Reviewed: .        None.      Physical Exam:   VS:  BP (!) 128/92   Pulse 82   Ht 6' 3 (1.905 m)   Wt 290 lb 9.6 oz (131.8 kg)   SpO2 93%   BMI 36.32 kg/m    Wt Readings from Last 3 Encounters:  08/12/23 290 lb 9.6 oz (131.8 kg)  07/15/23 291 lb 6.4 oz (132.2 kg)  06/20/23 (!) 304 lb (137.9 kg)    GEN: Well nourished, well developed in no acute distress NECK: No JVD; No carotid bruits CARDIAC: RRR, no murmurs, rubs, gallops RESPIRATORY:  Clear to auscultation without rales, wheezing or rhonchi  ABDOMEN: Soft, non-tender, non-distended EXTREMITIES:  No edema; No deformity   ASSESSMENT AND PLAN: .   Preop evaluation   Mr. Vivona's perioperative risk of a major cardiac event is 0.9% according to the Revised Cardiac Risk Index (RCRI).  Therefore, he is at low risk for perioperative complications.   His functional capacity is good at 5.07 METs according to the Duke Activity Status Index (DASI). Recommendations: According to  ACC/AHA guidelines, no further cardiovascular testing needed.  The patient may proceed to surgery at acceptable risk.     Hypertension Diastolic blood pressure consistently above 85 despite current regimen of Amlodipine  10mg  daily, Metoprolol  50mg  BID, and Valsartan  40mg  daily. Discussed increasing Valsartan  dosage. -Increase Valsartan  to 80mg  daily. Patient to take two 40mg  tablets until supply is exhausted.  Renal Cancer History of renal cancer with recent incidental finding of a new lesion on the  right kidney. Previous successful partial nephrectomy on the left kidney. Kidney function remains good. -Continue current management plan with primary care and surgical team.  Hyperlipidemia Well controlled on Lipitor 40mg  daily. Last cholesterol panel showed good control. -Continue Lipitor 40mg  daily.       Dispo: He can f/u in a year.  Signed, Orren LOISE Fabry, PA-C

## 2023-08-12 ENCOUNTER — Ambulatory Visit: Payer: Medicaid Other | Attending: Physician Assistant | Admitting: Physician Assistant

## 2023-08-12 ENCOUNTER — Encounter: Payer: Self-pay | Admitting: Physician Assistant

## 2023-08-12 VITALS — BP 128/92 | HR 82 | Ht 75.0 in | Wt 290.6 lb

## 2023-08-12 DIAGNOSIS — E7800 Pure hypercholesterolemia, unspecified: Secondary | ICD-10-CM | POA: Diagnosis not present

## 2023-08-12 DIAGNOSIS — Z01810 Encounter for preprocedural cardiovascular examination: Secondary | ICD-10-CM | POA: Diagnosis not present

## 2023-08-12 DIAGNOSIS — R55 Syncope and collapse: Secondary | ICD-10-CM | POA: Diagnosis not present

## 2023-08-12 DIAGNOSIS — I10 Essential (primary) hypertension: Secondary | ICD-10-CM | POA: Diagnosis not present

## 2023-08-12 MED ORDER — VALSARTAN 80 MG PO TABS: 80.0000 mg | ORAL_TABLET | Freq: Every day | ORAL | 3 refills | Status: DC

## 2023-08-12 NOTE — Patient Instructions (Addendum)
 Medication Instructions:  Your physician has recommended you make the following change in your medication:  1-INCREASE Diovan  80 mg by mouth daily.  *If you need a refill on your cardiac medications before your next appointment, please call your pharmacy*  Lab Work: If you have labs (blood work) drawn today and your tests are completely normal, you will receive your results only by: MyChart Message (if you have MyChart) OR A paper copy in the mail If you have any lab test that is abnormal or we need to change your treatment, we will call you to review the results.   Testing/Procedures: None ordered today.  Follow-Up: At [facility], you and your health needs are our priority.  As part of our continuing mission to provide you with exceptional heart care, we have created designated Provider Care Teams.  These Care Teams include your primary Cardiologist (physician) and Advanced Practice Providers (APPs -  Physician Assistants and Nurse Practitioners) who all work together to provide you with the care you need, when you need it.  We recommend signing up for the patient portal called MyChart.  Sign up information is provided on this After Visit Summary.  MyChart is used to connect with patients for Virtual Visits (Telemedicine).  Patients are able to view lab/test results, encounter notes, upcoming appointments, etc.  Non-urgent messages can be sent to your provider as well.   To learn more about what you can do with MyChart, go to [email or web address].    Your next appointment:   1 year(s)  Provider:   Oneil Parchment, MD     Other Instructions

## 2023-08-13 DIAGNOSIS — D49511 Neoplasm of unspecified behavior of right kidney: Secondary | ICD-10-CM | POA: Diagnosis not present

## 2023-08-13 DIAGNOSIS — Z85528 Personal history of other malignant neoplasm of kidney: Secondary | ICD-10-CM | POA: Diagnosis not present

## 2023-08-21 ENCOUNTER — Ambulatory Visit: Payer: Medicaid Other | Admitting: Podiatry

## 2023-08-29 ENCOUNTER — Other Ambulatory Visit: Payer: Self-pay | Admitting: Nurse Practitioner

## 2023-08-29 DIAGNOSIS — J418 Mixed simple and mucopurulent chronic bronchitis: Secondary | ICD-10-CM

## 2023-08-29 NOTE — Telephone Encounter (Signed)
Requested by interface surescripts. Future visit in 1 month. No refills remain. Requested Prescriptions  Pending Prescriptions Disp Refills   VENTOLIN HFA 108 (90 Base) MCG/ACT inhaler [Pharmacy Med Name: Ventolin HFA 108 (90 Base) MCG/ACT Inhalation Aerosol Solution] 18 g 0    Sig: INHALE 1 TO 2 PUFFS BY MOUTH EVERY 6 HOURS AS NEEDED FOR WHEEZING FOR SHORTNESS OF BREATH     Pulmonology:  Beta Agonists 2 Failed - 08/29/2023  3:38 PM      Failed - Last BP in normal range    BP Readings from Last 1 Encounters:  08/12/23 (!) 128/92         Passed - Last Heart Rate in normal range    Pulse Readings from Last 1 Encounters:  08/12/23 82         Passed - Valid encounter within last 12 months    Recent Outpatient Visits           1 month ago Diabetes mellitus treated with oral medication (HCC)   [facility] [place] [place], [place] W, NP   4 months ago Type 2 diabetes mellitus with other specified complication, without long-term current use of insulin (HCC)   [facility] [place]. [facility] [place], [place] W, NP   1 year ago Type 2 diabetes mellitus with other specified complication, without long-term current use of insulin (HCC)   [facility] [place]. [facility] Claiborne Rigg, NP   1 year ago Primary hypertension   [facility] [place]. [facility] [place], [place] W, NP   2 years ago Type 2 diabetes mellitus with other specified complication, without long-term current use of insulin (HCC)   [facility] [place]. [facility] Claiborne Rigg, NP       Future Appointments             In 1 month Claiborne Rigg, NP [facility] [place]. [facility]

## 2023-09-13 ENCOUNTER — Other Ambulatory Visit: Payer: Self-pay | Admitting: Urology

## 2023-10-14 ENCOUNTER — Ambulatory Visit: Payer: Medicaid Other | Attending: Nurse Practitioner | Admitting: Nurse Practitioner

## 2023-10-14 ENCOUNTER — Encounter: Payer: Self-pay | Admitting: Nurse Practitioner

## 2023-10-14 VITALS — BP 152/79 | HR 77 | Resp 97 | Ht 75.0 in | Wt 298.0 lb

## 2023-10-14 DIAGNOSIS — E119 Type 2 diabetes mellitus without complications: Secondary | ICD-10-CM | POA: Diagnosis not present

## 2023-10-14 DIAGNOSIS — J449 Chronic obstructive pulmonary disease, unspecified: Secondary | ICD-10-CM

## 2023-10-14 DIAGNOSIS — I10 Essential (primary) hypertension: Secondary | ICD-10-CM

## 2023-10-14 DIAGNOSIS — Z1211 Encounter for screening for malignant neoplasm of colon: Secondary | ICD-10-CM

## 2023-10-14 DIAGNOSIS — Z7984 Long term (current) use of oral hypoglycemic drugs: Secondary | ICD-10-CM

## 2023-10-14 DIAGNOSIS — J418 Mixed simple and mucopurulent chronic bronchitis: Secondary | ICD-10-CM

## 2023-10-14 LAB — POCT GLYCOSYLATED HEMOGLOBIN (HGB A1C): HbA1c, POC (controlled diabetic range): 6.7 % (ref 0.0–7.0)

## 2023-10-14 MED ORDER — PREDNISONE 20 MG PO TABS: 40.0000 mg | ORAL_TABLET | Freq: Every day | ORAL | 0 refills | Status: AC

## 2023-10-14 MED ORDER — VENTOLIN HFA 108 (90 BASE) MCG/ACT IN AERS: 1.0000 | INHALATION_SPRAY | Freq: Four times a day (QID) | RESPIRATORY_TRACT | 1 refills | Status: DC | PRN

## 2023-10-14 NOTE — Patient Instructions (Addendum)
 Dr. Willis Modena, MD [facility] Address: [address] [place], [place] [address] Phone: [phone]     Schedule lung CT [facility] [address]   [phone]

## 2023-10-14 NOTE — Progress Notes (Signed)
 Assessment & Plan:  Preston "Deniece Portela" was seen today for medical management of chronic issues.  Diagnoses and all orders for this visit:  Primary hypertension Make sure you pick up your valsartan 80 mg Continue all other antihypertensives as prescribed.  Reminded to bring in blood pressure log for follow  up appointment.  RECOMMENDATIONS: DASH/Mediterranean Diets are healthier choices for HTN.    Diabetes mellitus treated with oral medication (HCC) -     POCT glycosylated hemoglobin (Hb A1C)  COPD mixed type (HCC) -     For home use only DME Nebulizer machine -     predniSONE (DELTASONE) 20 MG tablet; Take 2 tablets (40 mg total) by mouth daily with breakfast for 5 days. FOR COUGH, wheezing and shortness of breath  Mixed simple and mucopurulent chronic bronchitis (HCC) -     VENTOLIN HFA 108 (90 Base) MCG/ACT inhaler; Inhale 1-2 puffs into the lungs every 6 (six) hours as needed for wheezing or shortness of breath.  Colon cancer screening -     Ambulatory referral to Gastroenterology    Patient has been counseled on age-appropriate routine health concerns for screening and prevention. These are reviewed and up-to-date. Referrals have been placed accordingly. Immunizations are up-to-date or declined.    Subjective:   Chief Complaint  Patient presents with   Medical Management of Chronic Issues    Preston [name] 53 y.o. male presents to office today for follow up to HTN  He has a past medical history of Anxiety, Asthma,  CHF  Chronic lower back pain, COPD, GERD, Gout, History of Clostridium difficile infection (2014), Hypercholesterolemia, Hypertension, DM, Osteoarthritis, Panic attacks, and Shoulder pain, right (04/18/2017).    HTN  Blood pressure is elevated today.  He is currently prescribed valsartan 80 mg however he states he has only been taking 40 mg daily and plans to pick up the 80 mg tablets today. He is also prescribed Lopressor 50 mg twice daily and amlodipine 10  mg daily.  BP Readings from Last 3 Encounters:  10/14/23 (!) 158/78  08/12/23 (!) 128/92  07/15/23 (!) 156/88     DM 2 Well controlled. He is currently prescribed metformin 1000 mg BID.  Lab Results  Component Value Date   HGBA1C 6.7 10/14/2023    Lab Results  Component Value Date   HGBA1C 6.9 04/15/2023    Lab Results  Component Value Date   LDLCALC 64 07/31/2022     COPD He is wheezing on exam today. Mild shortness of breath with activity. Needs refill of nebs and SABA along with new nebulizer machine.Started smoking at the age of 15. Recently cut down back to 1/2 ppd of cigarettes instead of 1ppd.  He endorses chronic cough and wheezing. Symptoms chronic dyspnea does worsen with exertion. Patient uses 2 pillows at night.  Patient currently is not on home oxygen therapy.[name] Respiratory history: COPD     Review of Systems  Constitutional:  Negative for fever, malaise/fatigue and weight loss.  HENT: Negative.  Negative for nosebleeds.   Eyes: Negative.  Negative for blurred vision, double vision and photophobia.  Respiratory: Negative.  Negative for cough and shortness of breath.   Cardiovascular: Negative.  Negative for chest pain, palpitations and leg swelling.  Gastrointestinal: Negative.  Negative for heartburn, nausea and vomiting.  Musculoskeletal: Negative.  Negative for myalgias.  Neurological: Negative.  Negative for dizziness, focal weakness, seizures and headaches.  Psychiatric/Behavioral: Negative.  Negative for suicidal ideas.     Past Medical History:  Diagnosis Date   Anxiety    Asthma    daily inhaler   Cancer (HCC)    pt. reports diagnosed with skin cancer on face/uncertain type but he reports may be basal cell, pending surgery to remove 09/03/18   CHF (congestive heart failure) (HCC)    no cardiologist   Chronic cough    states due to COPD   Chronic lower back pain    COPD (chronic obstructive pulmonary disease) (HCC)    no home O2   Dyspnea     with humidity and high pollen count   Edentulous    upper gum   GERD (gastroesophageal reflux disease)    Gout    History of Clostridium difficile infection 2014   Hypercholesterolemia    Hypertension    states under control with med., has been on med. x 4 yr.   Insulin dependent diabetes mellitus    type 2    Osteoarthritis    bilat. hip   Panic attacks    Shoulder pain, right 04/18/2017    Past Surgical History:  Procedure Laterality Date   COLON SURGERY     14 inches of colon removed   COLONOSCOPY WITH PROPOFOL N/A 04/01/2013   Procedure: COLONOSCOPY WITH PROPOFOL;  Surgeon: Willis Modena, MD;  Location: [facility] ENDOSCOPY;  Service: Endoscopy;  Laterality: N/A;   FLEXIBLE SIGMOIDOSCOPY N/A 02/28/2013   Procedure: FLEXIBLE SIGMOIDOSCOPY;  Surgeon: Vertell Novak., MD;  Location: [facility] ENDOSCOPY;  Service: Endoscopy;  Laterality: N/A;   LAPAROSCOPIC APPENDECTOMY  07/05/2009   LAPAROSCOPIC LOW ANTERIOR RESECTION N/A 07/08/2013   Procedure: LAPAROSCOPIC LOW ANTERIOR RESECTION WITH TAKEDOWN OF SPLENIC FLEXURE.;  Surgeon: Ernestene Mention, MD;  Location: [facility] OR;  Service: General;  Laterality: N/A;   MANDIBLE SURGERY Left 2010   "dry skin pocket cut out"   MULTIPLE EXTRACTIONS WITH ALVEOLOPLASTY N/A 04/24/2017   Procedure: SURGICAL REMOVAL OF TEETH NUMBERS `7 20-29 AND 31;  Surgeon: Vivia Ewing, DMD;  Location:  SURGERY CENTER;  Service: Oral Surgery;  Laterality: N/A;   ROBOTIC ASSITED PARTIAL NEPHRECTOMY Left 05/02/2020   Procedure: XI ROBOTIC ASSITED LAPAROSCOPIC PARTIAL NEPHRECTOMY;  Surgeon: Heloise Purpura, MD;  Location: [facility] ORS;  Service: Urology;  Laterality: Left;   TOTAL HIP ARTHROPLASTY Left 06/25/2016   Procedure: TOTAL HIP ARTHROPLASTY ANTERIOR APPROACH;  Surgeon: Gean Birchwood, MD;  Location: [facility] OR;  Service: Orthopedics;  Laterality: Left;   TOTAL HIP ARTHROPLASTY Right 09/10/2016   Procedure: TOTAL HIP ARTHROPLASTY ANTERIOR APPROACH;  Surgeon: Gean Birchwood, MD;   Location: [facility] OR;  Service: Orthopedics;  Laterality: Right;    Family History  Problem Relation Age of Onset   Cancer Father        lymphoma   Heart attack Mother     Social History Reviewed with no changes to be made today.   Outpatient Medications Prior to Visit  Medication Sig Dispense Refill   albuterol (PROVENTIL) (2.5 MG/3ML) 0.083% nebulizer solution Take 3 mLs (2.5 mg total) by nebulization every 6 (six) hours as needed for wheezing or shortness of breath. 150 mL 1   allopurinol (ZYLOPRIM) 300 MG tablet Take 1 tablet (300 mg total) by mouth daily. 90 tablet 1   amLODipine (NORVASC) 10 MG tablet Take 1 tablet (10 mg total) by mouth at bedtime. 90 tablet 1   atorvastatin (LIPITOR) 40 MG tablet Take 1 tablet (40 mg total) by mouth every evening. 90 tablet 1   Blood Glucose Monitoring Suppl (ACCU-CHEK AVIVA PLUS) w/Device KIT  1 each by Does not apply route 3 (three) times daily. 1 kit 0   Blood Pressure Monitor DEVI Please provide patient with insurance approved blood pressure monitor I10.0 1 each 0   busPIRone (BUSPAR) 15 MG tablet Take 1-2 tablets (15-30 mg total) by mouth 2 (two) times daily. For anxiety 180 tablet 1   calcium carbonate (TUMS EX) 750 MG chewable tablet Chew 1 tablet (750 mg total) by mouth 3 (three) times daily as needed for heartburn. 90 tablet 3   colchicine 0.6 MG tablet Take 1 capsule (0.6 mg total) by mouth daily as needed. M10.9 30 tablet 3   DULoxetine (CYMBALTA) 60 MG capsule Take 1 capsule (60 mg total) by mouth daily. 90 capsule 1   famotidine (PEPCID) 20 MG tablet Take 1 tablet (20 mg total) by mouth every evening. 180 tablet 1   gabapentin (NEURONTIN) 300 MG capsule Take 3 capsules (900 mg total) by mouth 2 (two) times daily. 540 capsule 1   glucose blood (ACCU-CHEK AVIVA PLUS) test strip Use as instructed for 3 times daily testing of blood sugar. E11.9 300 each 2   hydrOXYzine (ATARAX) 50 MG tablet Take 1 tablet (50 mg total) by mouth 3 (three) times  daily as needed. for anxiety 90 tablet 3   metFORMIN (GLUCOPHAGE) 500 MG tablet Take 2 tablets (1,000 mg total) by mouth 2 (two) times daily with a meal. TAKE 2 TABLETS BY MOUTH TWICE DAILY WITH  A  MEAL. OFFICE VISIT NEEDED FOR ADDITIONAL REFILLS 360 tablet 1   metoprolol tartrate (LOPRESSOR) 50 MG tablet Take 1 tablet (50 mg total) by mouth 2 (two) times daily. 180 tablet 1   omeprazole (PRILOSEC) 40 MG capsule Take 1 capsule (40 mg total) by mouth daily. 90 capsule 1   SYMBICORT 80-4.5 MCG/ACT inhaler Inhale 2 puffs into the lungs in the morning and at bedtime. 10.2 g 3   valsartan (DIOVAN) 80 MG tablet Take 1 tablet (80 mg total) by mouth daily. 90 tablet 3   VENTOLIN HFA 108 (90 Base) MCG/ACT inhaler INHALE 1 TO 2 PUFFS BY MOUTH EVERY 6 HOURS AS NEEDED FOR WHEEZING FOR SHORTNESS OF BREATH 18 g 0   No facility-administered medications prior to visit.    Allergies  Allergen Reactions   Morphine Itching and Nausea And Vomiting   Ace Inhibitors Cough   Adhesive [Tape] Itching and Rash       Objective:    BP (!) 158/78 (BP Location: Left Arm, Patient Position: Sitting, Cuff Size: Normal)   Pulse 77   Resp (!) 97   Ht 6\' 3"  (1.905 m)   Wt 298 lb (135.2 kg)   SpO2 100%   BMI 37.25 kg/m  Wt Readings from Last 3 Encounters:  10/14/23 298 lb (135.2 kg)  08/12/23 290 lb 9.6 oz (131.8 kg)  07/15/23 291 lb 6.4 oz (132.2 kg)    Physical Exam Vitals and nursing note reviewed.  Constitutional:      Appearance: He is well-developed.  HENT:     Head: Normocephalic and atraumatic.  Cardiovascular:     Rate and Rhythm: Normal rate and regular rhythm.     Heart sounds: Normal heart sounds. No murmur heard.    No friction rub. No gallop.  Pulmonary:     Effort: Pulmonary effort is normal. No tachypnea or respiratory distress.     Breath sounds: Examination of the left-upper field reveals wheezing. Examination of the left-middle field reveals wheezing. Examination of the left-lower field  reveals wheezing. Wheezing present. No decreased breath sounds, rhonchi or rales.  Chest:     Chest wall: No tenderness.  Abdominal:     General: Bowel sounds are normal.     Palpations: Abdomen is soft.  Musculoskeletal:        General: Normal range of motion.     Cervical back: Normal range of motion.  Skin:    General: Skin is warm and dry.  Neurological:     Mental Status: He is alert and oriented to person, place, and time.     Coordination: Coordination normal.  Psychiatric:        Behavior: Behavior normal. Behavior is cooperative.        Thought Content: Thought content normal.        Judgment: Judgment normal.          Patient has been counseled extensively about nutrition and exercise as well as the importance of adherence with medications and regular follow-up. The patient was given clear instructions to go to ER or return to medical center if symptoms don't improve, worsen or new problems develop. The patient verbalized understanding.   Follow-up: Return in about 3 months (around 01/14/2024).   Claiborne Rigg, FNP-BC [facility] [place], [place] [phone]   10/14/2023, 4:40 PM

## 2023-10-22 ENCOUNTER — Other Ambulatory Visit: Payer: Self-pay | Admitting: Nurse Practitioner

## 2023-10-22 DIAGNOSIS — F411 Generalized anxiety disorder: Secondary | ICD-10-CM

## 2023-10-22 DIAGNOSIS — J449 Chronic obstructive pulmonary disease, unspecified: Secondary | ICD-10-CM | POA: Diagnosis not present

## 2023-10-24 NOTE — Progress Notes (Addendum)
 Anesthesia Review:  PCP: Bertram Denver , NP LOV 10/14/23  Cardiologist : Lennice Sites LOV 08/12/23 [place] in note.   PPM/ ICD: Device Orders: Rep Notified:  Chest x-ray : CT chest- 2023  EKG :08/13/23  Echo : 2021  Monitor- 2021  Stress test: 2021  Cardiac Cath :   Activity level: can do a flight of stairs without difficulty  Sleep Study/ CPAP : Fasting Blood Sugar :      / Checks Blood Sugar -- times a day:    Blood Thinner/ Instructions /Last Dose: ASA / Instructions/ Last Dose :    DM- type 2- checks glucose seldom per pt  Hgba1c- 10/14/23- 6.7  Metformin- none am of surgery   Urology orders are in at time of preop appt.  Dr Michaell Cowing orders not in .  Have called and spoke with Joni Reining in Triage at [facility] and requested orders.     Smoker- PT aware no smoking after midntie.     BMP- doen 10/29/23 rotued to DR Michaell Cowing and DR Laverle Patter- .   Jacobo Forest aware on 10/29/23.

## 2023-10-28 NOTE — Patient Instructions (Addendum)
 SURGICAL WAITING ROOM VISITATION  Patients having surgery or a procedure may have no more than 2 support people in the waiting area - these visitors may rotate.    Children under the age of 65 must have an adult with them who is not the patient.  Due to an increase in RSV and influenza rates and associated hospitalizations, children ages 71 and under may not visit patients in [facility] hospitals.  Visitors with respiratory illnesses are discouraged from visiting and should remain at home.  If the patient needs to stay at the hospital during part of their recovery, the visitor guidelines for inpatient rooms apply. Pre-op nurse will coordinate an appropriate time for 1 support person to accompany patient in pre-op.  This support person may not rotate.    Please refer to the [facility] website for the visitor guidelines for Inpatients (after your surgery is over and you are in a regular room).       Your procedure is scheduled on:  10/31/2023    Report to [facility] Main Entrance    Report to admitting at  0515 AM   Call this number if you have problems the morning of surgery [phone]    Clear liquid diet the day before surgery.              Magnesium Citrate- 8 ounces at 12 noon day before surgery.              Fleets enema nite before surgery                  If you have questions, please contact your surgeon's office.   FOLLOW BOWEL PREP AND ANY ADDITIONAL PRE OP INSTRUCTIONS YOU RECEIVED FROM YOUR SURGEON'S OFFICE!!!     Oral Hygiene is also important to reduce your risk of infection.                                    Remember - BRUSH YOUR TEETH THE MORNING OF SURGERY WITH YOUR REGULAR TOOTHPASTE  DENTURES WILL BE REMOVED PRIOR TO SURGERY PLEASE DO NOT APPLY "Poly grip" OR ADHESIVES!!!   Do NOT smoke after Midnight   Stop all vitamins and herbal supplements 7 days before surgery.   Take these medicines the morning of surgery with A SIP OF WATER:    nebulizer if needed, inhalers as usual and bring, allopurinol, cymbalta, buspar, gabapentin, meotprolol, omeprazole               Metformin- none am of surgery   DO NOT TAKE ANY ORAL DIABETIC MEDICATIONS DAY OF YOUR SURGERY  Bring CPAP mask and tubing day of surgery.                              You may not have any metal on your body including hair pins, jewelry, and body piercing             Do not wear make-up, lotions, powders, perfumes/cologne, or deodorant  Do not wear nail polish including gel and S&S, artificial/acrylic nails, or any other type of covering on natural nails including finger and toenails. If you have artificial nails, gel coating, etc. that needs to be removed by a nail salon please have this removed prior to surgery or surgery may need to be canceled/ delayed if the surgeon/ anesthesia feels like  they are unable to be safely monitored.   Do not shave  48 hours prior to surgery.               Men may shave face and neck.   Do not bring valuables to the hospital. [facility] IS NOT             RESPONSIBLE   FOR VALUABLES.   Contacts, glasses, dentures or bridgework may not be worn into surgery.   Bring small overnight bag day of surgery.   DO NOT BRING YOUR HOME MEDICATIONS TO THE HOSPITAL. PHARMACY WILL DISPENSE MEDICATIONS LISTED ON YOUR MEDICATION LIST TO YOU DURING YOUR ADMISSION IN THE HOSPITAL!    Patients discharged on the day of surgery will not be allowed to drive home.  Someone NEEDS to stay with you for the first 24 hours after anesthesia.   Special Instructions: Bring a copy of your healthcare power of attorney and living will documents the day of surgery if you haven't scanned them before.              Please read over the following fact sheets you were given: IF YOU HAVE QUESTIONS ABOUT YOUR PRE-OP INSTRUCTIONS PLEASE CALL [phone]   If you received a COVID test during your pre-op visit  it is requested that you wear a mask when out in  public, stay away from anyone that may not be feeling well and notify your surgeon if you develop symptoms. If you test positive for Covid or have been in contact with anyone that has tested positive in the last 10 days please notify you surgeon.    [facility] - Preparing for Surgery Before surgery, you can play an important role.  Because skin is not sterile, your skin needs to be as free of germs as possible.  You can reduce the number of germs on your skin by washing with CHG (chlorahexidine gluconate) soap before surgery.  CHG is an antiseptic cleaner which kills germs and bonds with the skin to continue killing germs even after washing. Please DO NOT use if you have an allergy to CHG or antibacterial soaps.  If your skin becomes reddened/irritated stop using the CHG and inform your nurse when you arrive at Short Stay. Do not shave (including legs and underarms) for at least 48 hours prior to the first CHG shower.  You may shave your face/neck. Please follow these instructions carefully:  1.  Shower with CHG Soap the night before surgery and the  morning of Surgery.  2.  If you choose to wash your hair, wash your hair first as usual with your  normal  shampoo.  3.  After you shampoo, rinse your hair and body thoroughly to remove the  shampoo.                           4.  Use CHG as you would any other liquid soap.  You can apply chg directly  to the skin and wash                       Gently with a scrungie or clean washcloth.  5.  Apply the CHG Soap to your body ONLY FROM THE NECK DOWN.   Do not use on face/ open  Wound or open sores. Avoid contact with eyes, ears mouth and genitals (private parts).                       Wash face,  Genitals (private parts) with your normal soap.             6.  Wash thoroughly, paying special attention to the area where your surgery  will be performed.  7.  Thoroughly rinse your body with warm water from the neck down.  8.  DO NOT  shower/wash with your normal soap after using and rinsing off  the CHG Soap.                9.  Pat yourself dry with a clean towel.            10.  Wear clean pajamas.            11.  Place clean sheets on your bed the night of your first shower and do not  sleep with pets. Day of Surgery : Do not apply any lotions/deodorants the morning of surgery.  Please wear clean clothes to the hospital/surgery center.  FAILURE TO FOLLOW THESE INSTRUCTIONS MAY RESULT IN THE CANCELLATION OF YOUR SURGERY PATIENT SIGNATURE_________________________________  NURSE SIGNATURE__________________________________  ________________________________________________________________________

## 2023-10-29 ENCOUNTER — Encounter (HOSPITAL_COMMUNITY)
Admission: RE | Admit: 2023-10-29 | Discharge: 2023-10-29 | Disposition: A | Discharge status: Home / Self Care | Payer: Medicaid Other | Source: Ambulatory Visit | Attending: Urology | Admitting: Urology

## 2023-10-29 ENCOUNTER — Ambulatory Visit: Payer: Self-pay | Admitting: Surgery

## 2023-10-29 ENCOUNTER — Encounter (HOSPITAL_COMMUNITY): Payer: Self-pay

## 2023-10-29 ENCOUNTER — Other Ambulatory Visit: Payer: Self-pay

## 2023-10-29 DIAGNOSIS — N2889 Other specified disorders of kidney and ureter: Secondary | ICD-10-CM | POA: Insufficient documentation

## 2023-10-29 DIAGNOSIS — Z7984 Long term (current) use of oral hypoglycemic drugs: Secondary | ICD-10-CM | POA: Diagnosis not present

## 2023-10-29 DIAGNOSIS — I110 Hypertensive heart disease with heart failure: Secondary | ICD-10-CM | POA: Diagnosis not present

## 2023-10-29 DIAGNOSIS — I509 Heart failure, unspecified: Secondary | ICD-10-CM | POA: Insufficient documentation

## 2023-10-29 DIAGNOSIS — F17210 Nicotine dependence, cigarettes, uncomplicated: Secondary | ICD-10-CM | POA: Diagnosis not present

## 2023-10-29 DIAGNOSIS — Z01812 Encounter for preprocedural laboratory examination: Secondary | ICD-10-CM | POA: Insufficient documentation

## 2023-10-29 DIAGNOSIS — K430 Incisional hernia with obstruction, without gangrene: Secondary | ICD-10-CM | POA: Insufficient documentation

## 2023-10-29 DIAGNOSIS — E119 Type 2 diabetes mellitus without complications: Secondary | ICD-10-CM | POA: Diagnosis not present

## 2023-10-29 DIAGNOSIS — J449 Chronic obstructive pulmonary disease, unspecified: Secondary | ICD-10-CM | POA: Insufficient documentation

## 2023-10-29 DIAGNOSIS — Z794 Long term (current) use of insulin: Secondary | ICD-10-CM | POA: Diagnosis not present

## 2023-10-29 LAB — BASIC METABOLIC PANEL WITH GFR
Anion gap: 10 (ref 5–15)
BUN: 6 mg/dL (ref 6–20)
CO2: 25 mmol/L (ref 22–32)
Calcium: 9.7 mg/dL (ref 8.9–10.3)
Chloride: 94 mmol/L — ABNORMAL LOW (ref 98–111)
Creatinine, Ser: 0.76 mg/dL (ref 0.61–1.24)
GFR, Estimated: >60 mL/min (ref >60)
Glucose, Bld: 175 mg/dL — ABNORMAL HIGH (ref 70–99)
Potassium: 5.3 mmol/L — ABNORMAL HIGH (ref 3.5–5.1)
Sodium: 129 mmol/L — ABNORMAL LOW (ref 135–145)

## 2023-10-29 LAB — CBC
HCT: 49.1 % (ref 39.0–52.0)
Hemoglobin: 16.5 g/dL (ref 13.0–17.0)
MCH: 31.7 pg (ref 26.0–34.0)
MCHC: 33.6 g/dL (ref 30.0–36.0)
MCV: 94.4 fL (ref 80.0–100.0)
Platelets: 282 10*3/uL (ref 150–400)
RBC: 5.2 MIL/uL (ref 4.22–5.81)
RDW: 13.1 % (ref 11.5–15.5)
WBC: 11.5 10*3/uL — ABNORMAL HIGH (ref 4.0–10.5)
nRBC: 0 % (ref 0.0–0.2)

## 2023-10-29 LAB — GLUCOSE, CAPILLARY: Glucose-Capillary: 181 mg/dL — ABNORMAL HIGH (ref 70–99)

## 2023-10-30 NOTE — H&P (Signed)
 Office Visit Report     10/15/2023   --------------------------------------------------------------------------------   Preston [name]  MRN: [id number]  DOB: 1971/05/25, 53 year old Male  SSN:    PRIMARY CARE:  Bertram Denver, NP  PRIMARY CARE FAX:  [phone]  REFERRING:  Azucena Kuba  PROVIDER:  Heloise Purpura, M.D.  TREATING:  Ulyses Amor, [place]  LOCATION:  [facility] [phone]     --------------------------------------------------------------------------------   CC/HPI: Pt presents today for pre-operative history and physical exam in anticipation of robotic assisted lap right partial nephrectomy with Dr. Laverle Patter and incisional hernia repair by Dr. Michaell Cowing on 10/31/23. He is doing well and is without complaint.   Pt denies F/C, HA, CP, SOB, N/V, diarrhea/constipation, back pain, flank pain, hematuria, and dysuria.      HX:   1. Renal cell carcinoma  2. Right renal neoplasm   [name] returns today a little over 3 years out from his left partial nephrectomy. He has developed a worsening incisional hernia and has seen Dr. Estelle Grumbles who has recommended surgical repair. We therefore given further thought to addressing his right renal lesion at this point since he will require general anesthesia and surgery. He was seen by cardiology yesterday and cleared to proceed with surgery with a less than 1% risk of perioperative cardiac complications. He follows up today with his most recent imaging including CT imaging of the chest and abdomen.   His CT confirms a stable 1.3 cm enhancing mass off the lateral aspect of the interpolar region of the right kidney. There is an adjacent simple cyst. There is no regional lymphadenopathy. He appears to have single renal vessels on the right side. The left sided partial nephrectomy defect is present without evidence of recurrence. No evidence of metastatic disease.   Creatinine on 08/02/2023 was 0.6. LFTs are  normal.     ALLERGIES: Adhesive Tape Morphine - Nausea, Itching    MEDICATIONS: Allopurinol  Aspirin 325 mg tablet  Metformin Hcl  Metoprolol Tartrate 50 mg tablet  Amlodipine Besilate  Atorvastatin Calcium  Famotidine  Gabapentin  Valsartan 80 mg tablet     GU PSH: Locm 300-399Mg /Ml Iodine,1Ml - 10/04/2022, 02/23/2022, 2023, 2021 Partial nephrectomy (laparoscopic), Left - 2021       PSH Notes: Bilateral hip replacements  All teeth removed  Basal cell removal with graft right side of nose     NON-GU PSH: Appendectomy (laparoscopic) Partial Remove Colon, 14 in removed     GU PMH: History of kidney cancer - 08/13/2023, - 02/08/2023, - 03/27/2022, - 02/23/2022, - 2023 Right renal neoplasm - 08/13/2023, - 02/08/2023, - 03/27/2022, - 2023 Renal cell carcinoma, left - 08/06/2023, - 10/04/2022, - 2023, - 05/24/2021, - 2022, - 2021 Epigastric pain - 05/24/2021 Left renal neoplasm - 2021, - 2021, - 2021      PMH Notes:   1) Renal cell carcinoma: He is s/p a left RAL partial nephrectomy for a 4.2 cm left renal mass on 05/02/20.   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    NON-GU PMH: Anxiety Arthritis Asthma Congestive heart failure Diabetes Type 2 GERD Gout Hypercholesterolemia Hypertension    FAMILY HISTORY: None   SOCIAL HISTORY: Marital Status: Unknown Preferred Language: English; Ethnicity: Not  Hispanic Or Latino; Race: White Current Smoking Status: Patient smokes.   Tobacco Use Assessment Completed: Used Tobacco in last 30 days? Does not use smokeless tobacco. Does drink.  Does not use drugs. Drinks 1 caffeinated drink per day. Has not had a blood transfusion.     Notes: Smokes 1 ppd and has for 35 years  ETOH 6 pack per day    REVIEW OF SYSTEMS:    GU Review Male:    Patient denies frequent urination, hard to postpone urination, burning/ pain with urination, get up at night to urinate, leakage of urine, stream starts and stops, trouble starting your stream, have to strain to urinate , erection problems, and penile pain.  Gastrointestinal (Upper):   Patient denies nausea, vomiting, and indigestion/ heartburn.  Gastrointestinal (Lower):   Patient denies diarrhea and constipation.  Constitutional:   Patient denies fever, night sweats, weight loss, and fatigue.  Skin:   Patient denies skin rash/ lesion and itching.  Eyes:   Patient denies blurred vision and double vision.  Ears/ Nose/ Throat:   Patient denies sore throat and sinus problems.  Hematologic/Lymphatic:   Patient denies swollen glands and easy bruising.  Cardiovascular:   Patient denies leg swelling and chest pains.  Respiratory:   Patient denies cough and shortness of breath.  Endocrine:   Patient denies excessive thirst.  Musculoskeletal:   Patient denies back pain and joint pain.  Neurological:   Patient denies headaches and dizziness.  Psychologic:   Patient denies depression and anxiety.   VITAL SIGNS:      10/15/2023 01:41 PM  Weight 298 lb / 135.17 kg  Height 74 in / 187.96 cm  BP 152/95 mmHg  Heart Rate 80 /min  Temperature 96.8 F / 36 C  BMI 38.3 kg/m   MULTI-SYSTEM PHYSICAL EXAMINATION:    Constitutional: Well-nourished. No physical deformities. Normally developed. Good grooming.  Neck: Neck symmetrical, not swollen. Normal tracheal position.  Respiratory: Normal breath sounds. No labored breathing, no use of accessory muscles.   Cardiovascular: Regular rate and rhythm. No murmur, no gallop.   Lymphatic: No enlargement of neck, axillae, groin.  Skin: No paleness, no jaundice, no cyanosis. No lesion, no ulcer, no rash.  Neurologic / Psychiatric: Oriented to time, oriented to place, oriented to person. No depression, no anxiety, no agitation.  Gastrointestinal: Incisional hernia,  no tenderness, no rigidity, obese abdomen.   Eyes: Normal conjunctivae. Normal eyelids.  Ears, Nose, Mouth, and Throat: Left ear no scars, no lesions, no masses. Right ear no scars, no lesions, no masses. Nose no scars, no lesions, no masses. Normal hearing. Normal lips.  Musculoskeletal: Normal gait and station of head and neck.     Complexity of Data:  Records Review:   Previous Patient Records  Urine Test Review:   Urinalysis   10/15/23  Urinalysis  Urine Appearance Clear   Urine Color Yellow   Urine Glucose Neg mg/dL  Urine Bilirubin Neg mg/dL  Urine Ketones Neg mg/dL  Urine Specific Gravity 1.010   Urine Blood Neg ery/uL  Urine pH 7.0   Urine Protein 2+ mg/dL  Urine Urobilinogen 0.2 mg/dL  Urine Nitrites Neg   Urine Leukocyte Esterase Neg leu/uL  Urine WBC/hpf 0 - 5/hpf   Urine RBC/hpf NS (Not Seen)   Urine Epithelial Cells NS (Not Seen)   Urine Bacteria NS (Not Seen)   Urine Mucous Not Present   Urine Yeast NS (Not Seen)   Urine Trichomonas Not Present   Urine Cystals NS (  Not Seen)   Urine Casts NS (Not Seen)   Urine Sperm Not Present    PROCEDURES:          Urinalysis w/Scope - 81001 Dipstick Dipstick Cont'd Micro  Color: Yellow Bilirubin: Neg mg/dL WBC/hpf: 0 - 5/hpf  Appearance: Clear Ketones: Neg mg/dL RBC/hpf: NS (Not Seen)  Specific Gravity: 1.010 Blood: Neg ery/uL Bacteria: NS (Not Seen)  pH: 7.0 Protein: 2+ mg/dL Cystals: NS (Not Seen)  Glucose: Neg mg/dL Urobilinogen: 0.2 mg/dL Casts: NS (Not Seen)    Nitrites: Neg Trichomonas: Not Present    Leukocyte Esterase: Neg leu/uL Mucous: Not Present      Epithelial Cells: NS (Not Seen)      Yeast: NS (Not Seen)      Sperm: Not Present    ASSESSMENT:      ICD-10 Details  1 GU:   Right renal neoplasm - D49.511    PLAN:           Schedule Return Visit/Planned Activity: Keep Scheduled Appointment - Schedule Surgery          Document Letter(s):  Created for Patient: Clinical Summary         Notes:    There are no changes in the patients history or physical exam since last evaluation by Dr. Laverle Patter. Pt is scheduled to undergo RAL right partial nephrectomy and incisional hernia repair on 10/31/23.   All pt's questions were answered to the best of my ability.          Next Appointment:      Next Appointment: 10/31/2023 07:15 AM    Appointment Type: Surgery     Location: [facility] [phone]    Provider: Heloise Purpura, M.D.    Reason for Visit: [facility]/OBS (R) RA L;LAP PARTIAL NEPHRECTOMY WITH AMANDA      * Signed by Ulyses Amor, PA on 10/15/23 at 2:00 PM (EDT)*

## 2023-10-30 NOTE — Anesthesia Preprocedure Evaluation (Signed)
 Anesthesia Evaluation  Patient identified by MRN, date of birth, ID band Patient awake    Reviewed: Allergy & Precautions, NPO status , Patient's Chart, lab work & pertinent test results  Airway Mallampati: I       Dental  (+) Edentulous Upper, Edentulous Lower   Pulmonary asthma , COPD,  COPD inhaler, Current Smoker and Patient abstained from smoking.    + decreased breath sounds      Cardiovascular hypertension, Pt. on medications and Pt. on home beta blockers +CHF   Rhythm:Regular Rate:Normal  Echo:  1. Left ventricular ejection fraction, by estimation, is 55 to 60%. The  left ventricle has normal function. The left ventricle has no regional  wall motion abnormalities. There is mild concentric left ventricular  hypertrophy. Left ventricular diastolic  parameters are consistent with Grade II diastolic dysfunction  (pseudonormalization). Elevated left atrial pressure.   2. Right ventricular systolic function is normal. The right ventricular  size is normal.   3. Left atrial size was mildly dilated.   4. The mitral valve is normal in structure. No evidence of mitral valve  regurgitation. No evidence of mitral stenosis.   5. The aortic valve is normal in structure. Aortic valve regurgitation is  not visualized. No aortic stenosis is present.   6. The inferior vena cava is normal in size with greater than 50%  respiratory variability, suggesting right atrial pressure of 3 mmHg.     Neuro/Psych  PSYCHIATRIC DISORDERS Anxiety Depression     Neuromuscular disease    GI/Hepatic Neg liver ROS,GERD  Medicated,,  Endo/Other  diabetes    Renal/GU Renal disease     Musculoskeletal  (+) Arthritis ,    Abdominal   Peds  Hematology negative hematology ROS (+)   Anesthesia Other Findings   Reproductive/Obstetrics                             Anesthesia Physical Anesthesia Plan  ASA:  3  Anesthesia Plan: General   Post-op Pain Management: Tylenol PO (pre-op)* and Toradol IV (intra-op)*   Induction: Intravenous  PONV Risk Score and Plan: 2 and Ondansetron, Dexamethasone and Midazolam  Airway Management Planned: Oral ETT  Additional Equipment: None  Intra-op Plan:   Post-operative Plan: Extubation in OR  Informed Consent: I have reviewed the patients History and Physical, chart, labs and discussed the procedure including the risks, benefits and alternatives for the proposed anesthesia with the patient or authorized representative who has indicated his/her understanding and acceptance.       Plan Discussed with: CRNA  Anesthesia Plan Comments: (See PAT note 10/29/2023)       Anesthesia Quick Evaluation

## 2023-10-30 NOTE — Progress Notes (Signed)
 Anesthesia Chart Review   Case: [id number] Date/Time: 10/31/23 0700   Procedures:      XI RIGHT ROBOTIC ASSISTED LAPAROSCOPIC PARTIAL NEPHRECTOMY (Right) - 150 MINUTES NEEDED FOR CASE     XI ROBOTIC ASSISTED INCISIONAL HERNIA REPAIR VS LAPAROSCOPIC HERNIA REPAIR   Anesthesia type: General   Pre-op diagnosis: RIGHT RENAL MASS, VENTRAL INCISIONAL INCARCERATED HERNIA   Location: [facility]OM 03 / [facility]   Surgeons: Heloise Purpura, MD; Karie Soda, MD       DISCUSSION:53 y.o. smoker with h/o HTN, COPD, CHF, DM II, s/p left partial nephrectomy, right renal mass, ventral incisional hernia scheduled for above procedure 10/31/2023 with Dr. Karie Soda and Dr. Heloise Purpura.   Per cardiology preoperative evaluation 08/12/2023, "Preston [name]'s perioperative risk of a major cardiac event is 0.9% according to the Revised Cardiac Risk Index (RCRI).  Therefore, he is at low risk for perioperative complications.   His functional capacity is good at 5.07 METs according to the Duke Activity Status Index (DASI). Recommendations: According to ACC/AHA guidelines, no further cardiovascular testing needed.  The patient may proceed to surgery at acceptable risk.  "  Pt seen by PCP 10/14/2023. Mild COPD exacerbation at this visit.  Started on course of steroids.  Stable at PAT visit. Evaluate DOS.   Sodium 129 at PAT, recheck DOS.  VS: BP (!) 148/95   Pulse 73   Temp 36.9 C (Oral)   Resp 16   Ht 6\' 3"  (1.905 m)   Wt 135 kg   SpO2 97%   BMI 37.20 kg/m   PROVIDERS: Claiborne Rigg, NP is PCP   Primary Cardiologist:Mark Anne Fu, MD   LABS:  recheck sodium DOS (all labs ordered are listed, but only abnormal results are displayed)  Labs Reviewed  CBC - Abnormal; Notable for the following components:      Result Value   WBC 11.5 (*)    All other components within normal limits  BASIC METABOLIC PANEL WITH GFR - Abnormal; Notable for the following components:   Sodium 129 (*)    Potassium 5.3 (*)    Chloride  94 (*)    Glucose, Bld 175 (*)    All other components within normal limits  GLUCOSE, CAPILLARY - Abnormal; Notable for the following components:   Glucose-Capillary 181 (*)    All other components within normal limits  TYPE AND SCREEN     IMAGES:   EKG:   CV: Echo 10/28/2019  1. Left ventricular ejection fraction, by estimation, is 55 to 60%. The  left ventricle has normal function. The left ventricle has no regional  wall motion abnormalities. There is mild concentric left ventricular  hypertrophy. Left ventricular diastolic  parameters are consistent with Grade II diastolic dysfunction  (pseudonormalization). Elevated left atrial pressure.   2. Right ventricular systolic function is normal. The right ventricular  size is normal.   3. Left atrial size was mildly dilated.   4. The mitral valve is normal in structure. No evidence of mitral valve  regurgitation. No evidence of mitral stenosis.   5. The aortic valve is normal in structure. Aortic valve regurgitation is  not visualized. No aortic stenosis is present.   6. The inferior vena cava is normal in size with greater than 50%  respiratory variability, suggesting right atrial pressure of 3 mmHg.   Past Medical History:  Diagnosis Date   Anxiety    Asthma    daily inhaler   Cancer (HCC)    pt. reports  diagnosed with skin cancer on face/uncertain type but he reports may be basal cell, pending surgery to remove 09/03/18   CHF (congestive heart failure) (HCC)    no cardiologist   Chronic cough    states due to COPD   Chronic lower back pain    COPD (chronic obstructive pulmonary disease) (HCC)    no home O2   Dyspnea    with humidity and high pollen count   Edentulous    upper gum   GERD (gastroesophageal reflux disease)    Gout    History of Clostridium difficile infection 2014   Hypercholesterolemia    Hypertension    states under control with med., has been on med. x 4 yr.   Insulin dependent diabetes  mellitus    type 2    Osteoarthritis    bilat. hip   Panic attacks    Shoulder pain, right 04/18/2017    Past Surgical History:  Procedure Laterality Date   COLON SURGERY     14 inches of colon removed   COLONOSCOPY WITH PROPOFOL N/A 04/01/2013   Procedure: COLONOSCOPY WITH PROPOFOL;  Surgeon: Willis Modena, MD;  Location: [facility];  Service: Endoscopy;  Laterality: N/A;   FLEXIBLE SIGMOIDOSCOPY N/A 02/28/2013   Procedure: FLEXIBLE SIGMOIDOSCOPY;  Surgeon: Vertell Novak., MD;  Location: [facility] ENDOSCOPY;  Service: Endoscopy;  Laterality: N/A;   LAPAROSCOPIC APPENDECTOMY  07/05/2009   LAPAROSCOPIC LOW ANTERIOR RESECTION N/A 07/08/2013   Procedure: LAPAROSCOPIC LOW ANTERIOR RESECTION WITH TAKEDOWN OF SPLENIC FLEXURE.;  Surgeon: Ernestene Mention, MD;  Location: [facility];  Service: General;  Laterality: N/A;   MANDIBLE SURGERY Left 2010   "dry skin pocket cut out"   MULTIPLE EXTRACTIONS WITH ALVEOLOPLASTY N/A 04/24/2017   Procedure: SURGICAL REMOVAL OF TEETH NUMBERS `7 20-29 AND 31;  Surgeon: Vivia Ewing, DMD;  Location: [facility];  Service: Oral Surgery;  Laterality: N/A;   ROBOTIC ASSITED PARTIAL NEPHRECTOMY Left 05/02/2020   Procedure: XI ROBOTIC ASSITED LAPAROSCOPIC PARTIAL NEPHRECTOMY;  Surgeon: Heloise Purpura, MD;  Location: [facility];  Service: Urology;  Laterality: Left;   TOTAL HIP ARTHROPLASTY Left 06/25/2016   Procedure: TOTAL HIP ARTHROPLASTY ANTERIOR APPROACH;  Surgeon: Gean Birchwood, MD;  Location: [facility];  Service: Orthopedics;  Laterality: Left;   TOTAL HIP ARTHROPLASTY Right 09/10/2016   Procedure: TOTAL HIP ARTHROPLASTY ANTERIOR APPROACH;  Surgeon: Gean Birchwood, MD;  Location: [facility];  Service: Orthopedics;  Laterality: Right;    MEDICATIONS:  albuterol (PROVENTIL) (2.5 MG/3ML) 0.083% nebulizer solution   allopurinol (ZYLOPRIM) 300 MG tablet   amLODipine (NORVASC) 10 MG tablet   atorvastatin (LIPITOR) 40 MG tablet   Blood Glucose Monitoring Suppl (ACCU-CHEK  AVIVA PLUS) w/Device KIT   Blood Pressure Monitor DEVI   busPIRone (BUSPAR) 15 MG tablet   calcium carbonate (TUMS EX) 750 MG chewable tablet   colchicine 0.6 MG tablet   DULoxetine (CYMBALTA) 60 MG capsule   famotidine (PEPCID) 20 MG tablet   gabapentin (NEURONTIN) 300 MG capsule   glucose blood (ACCU-CHEK AVIVA PLUS) test strip   hydrOXYzine (ATARAX) 50 MG tablet   metFORMIN (GLUCOPHAGE) 500 MG tablet   metoprolol tartrate (LOPRESSOR) 50 MG tablet   omeprazole (PRILOSEC) 40 MG capsule   SYMBICORT 80-4.5 MCG/ACT inhaler   valsartan (DIOVAN) 80 MG tablet   VENTOLIN HFA 108 (90 Base) MCG/ACT inhaler   No current facility-administered medications for this encounter.    Jodell Cipro Ward, PA-C [facility] [phone]

## 2023-10-31 ENCOUNTER — Ambulatory Visit (HOSPITAL_COMMUNITY): Payer: Self-pay | Admitting: Physician Assistant

## 2023-10-31 ENCOUNTER — Encounter (HOSPITAL_COMMUNITY): Admission: RE | Disposition: E | Payer: Self-pay | Source: Home / Self Care | Attending: Urology

## 2023-10-31 ENCOUNTER — Inpatient Hospital Stay (HOSPITAL_COMMUNITY)
Admission: RE | Admit: 2023-10-31 | Discharge: 2023-11-28 | DRG: 981 | Disposition: E | Payer: Medicaid Other | Attending: Urology | Admitting: Urology

## 2023-10-31 ENCOUNTER — Other Ambulatory Visit: Payer: Self-pay

## 2023-10-31 ENCOUNTER — Encounter (HOSPITAL_COMMUNITY): Payer: Self-pay | Admitting: Urology

## 2023-10-31 DIAGNOSIS — Z8249 Family history of ischemic heart disease and other diseases of the circulatory system: Secondary | ICD-10-CM

## 2023-10-31 DIAGNOSIS — J45901 Unspecified asthma with (acute) exacerbation: Secondary | ICD-10-CM | POA: Diagnosis present

## 2023-10-31 DIAGNOSIS — E7800 Pure hypercholesterolemia, unspecified: Secondary | ICD-10-CM | POA: Diagnosis present

## 2023-10-31 DIAGNOSIS — M109 Gout, unspecified: Secondary | ICD-10-CM | POA: Diagnosis present

## 2023-10-31 DIAGNOSIS — E869 Volume depletion, unspecified: Secondary | ICD-10-CM | POA: Diagnosis present

## 2023-10-31 DIAGNOSIS — Z7951 Long term (current) use of inhaled steroids: Secondary | ICD-10-CM

## 2023-10-31 DIAGNOSIS — E871 Hypo-osmolality and hyponatremia: Secondary | ICD-10-CM | POA: Diagnosis present

## 2023-10-31 DIAGNOSIS — E875 Hyperkalemia: Secondary | ICD-10-CM | POA: Diagnosis present

## 2023-10-31 DIAGNOSIS — I509 Heart failure, unspecified: Secondary | ICD-10-CM | POA: Diagnosis not present

## 2023-10-31 DIAGNOSIS — Z885 Allergy status to narcotic agent status: Secondary | ICD-10-CM

## 2023-10-31 DIAGNOSIS — K429 Umbilical hernia without obstruction or gangrene: Secondary | ICD-10-CM | POA: Diagnosis present

## 2023-10-31 DIAGNOSIS — I5032 Chronic diastolic (congestive) heart failure: Secondary | ICD-10-CM | POA: Diagnosis present

## 2023-10-31 DIAGNOSIS — K7682 Hepatic encephalopathy: Secondary | ICD-10-CM | POA: Diagnosis present

## 2023-10-31 DIAGNOSIS — T380X5A Adverse effect of glucocorticoids and synthetic analogues, initial encounter: Secondary | ICD-10-CM | POA: Diagnosis present

## 2023-10-31 DIAGNOSIS — I110 Hypertensive heart disease with heart failure: Secondary | ICD-10-CM

## 2023-10-31 DIAGNOSIS — G8929 Other chronic pain: Secondary | ICD-10-CM | POA: Diagnosis present

## 2023-10-31 DIAGNOSIS — E785 Hyperlipidemia, unspecified: Secondary | ICD-10-CM | POA: Diagnosis present

## 2023-10-31 DIAGNOSIS — D3001 Benign neoplasm of right kidney: Secondary | ICD-10-CM | POA: Diagnosis present

## 2023-10-31 DIAGNOSIS — K430 Incisional hernia with obstruction, without gangrene: Secondary | ICD-10-CM | POA: Diagnosis present

## 2023-10-31 DIAGNOSIS — K436 Other and unspecified ventral hernia with obstruction, without gangrene: Secondary | ICD-10-CM

## 2023-10-31 DIAGNOSIS — J9601 Acute respiratory failure with hypoxia: Secondary | ICD-10-CM | POA: Diagnosis present

## 2023-10-31 DIAGNOSIS — Z7984 Long term (current) use of oral hypoglycemic drugs: Secondary | ICD-10-CM

## 2023-10-31 DIAGNOSIS — D369 Benign neoplasm, unspecified site: Secondary | ICD-10-CM | POA: Diagnosis present

## 2023-10-31 DIAGNOSIS — Z807 Family history of other malignant neoplasms of lymphoid, hematopoietic and related tissues: Secondary | ICD-10-CM

## 2023-10-31 DIAGNOSIS — F10931 Alcohol use, unspecified with withdrawal delirium: Secondary | ICD-10-CM | POA: Diagnosis not present

## 2023-10-31 DIAGNOSIS — N179 Acute kidney failure, unspecified: Secondary | ICD-10-CM | POA: Diagnosis present

## 2023-10-31 DIAGNOSIS — E781 Pure hyperglyceridemia: Secondary | ICD-10-CM | POA: Diagnosis present

## 2023-10-31 DIAGNOSIS — M6208 Separation of muscle (nontraumatic), other site: Secondary | ICD-10-CM | POA: Diagnosis present

## 2023-10-31 DIAGNOSIS — F1010 Alcohol abuse, uncomplicated: Secondary | ICD-10-CM | POA: Diagnosis present

## 2023-10-31 DIAGNOSIS — J80 Acute respiratory distress syndrome: Secondary | ICD-10-CM | POA: Diagnosis present

## 2023-10-31 DIAGNOSIS — N2889 Other specified disorders of kidney and ureter: Secondary | ICD-10-CM

## 2023-10-31 DIAGNOSIS — E66812 Obesity, class 2: Secondary | ICD-10-CM | POA: Diagnosis present

## 2023-10-31 DIAGNOSIS — E1140 Type 2 diabetes mellitus with diabetic neuropathy, unspecified: Secondary | ICD-10-CM | POA: Diagnosis present

## 2023-10-31 DIAGNOSIS — F17210 Nicotine dependence, cigarettes, uncomplicated: Secondary | ICD-10-CM | POA: Diagnosis present

## 2023-10-31 DIAGNOSIS — G9341 Metabolic encephalopathy: Secondary | ICD-10-CM | POA: Diagnosis not present

## 2023-10-31 DIAGNOSIS — Z9049 Acquired absence of other specified parts of digestive tract: Secondary | ICD-10-CM

## 2023-10-31 DIAGNOSIS — D49511 Neoplasm of unspecified behavior of right kidney: Secondary | ICD-10-CM | POA: Diagnosis not present

## 2023-10-31 DIAGNOSIS — Z66 Do not resuscitate: Secondary | ICD-10-CM | POA: Diagnosis not present

## 2023-10-31 DIAGNOSIS — J690 Pneumonitis due to inhalation of food and vomit: Secondary | ICD-10-CM | POA: Diagnosis not present

## 2023-10-31 DIAGNOSIS — Z720 Tobacco use: Secondary | ICD-10-CM | POA: Diagnosis present

## 2023-10-31 DIAGNOSIS — E119 Type 2 diabetes mellitus without complications: Secondary | ICD-10-CM

## 2023-10-31 DIAGNOSIS — Z781 Physical restraint status: Secondary | ICD-10-CM

## 2023-10-31 DIAGNOSIS — Y836 Removal of other organ (partial) (total) as the cause of abnormal reaction of the patient, or of later complication, without mention of misadventure at the time of the procedure: Secondary | ICD-10-CM | POA: Diagnosis not present

## 2023-10-31 DIAGNOSIS — D49512 Neoplasm of unspecified behavior of left kidney: Principal | ICD-10-CM

## 2023-10-31 DIAGNOSIS — E1165 Type 2 diabetes mellitus with hyperglycemia: Secondary | ICD-10-CM | POA: Diagnosis present

## 2023-10-31 DIAGNOSIS — Z79899 Other long term (current) drug therapy: Secondary | ICD-10-CM

## 2023-10-31 DIAGNOSIS — Z96643 Presence of artificial hip joint, bilateral: Secondary | ICD-10-CM | POA: Diagnosis present

## 2023-10-31 DIAGNOSIS — F10231 Alcohol dependence with withdrawal delirium: Principal | ICD-10-CM | POA: Diagnosis present

## 2023-10-31 DIAGNOSIS — K567 Ileus, unspecified: Secondary | ICD-10-CM | POA: Diagnosis not present

## 2023-10-31 DIAGNOSIS — J441 Chronic obstructive pulmonary disease with (acute) exacerbation: Secondary | ICD-10-CM | POA: Diagnosis present

## 2023-10-31 DIAGNOSIS — Z833 Family history of diabetes mellitus: Secondary | ICD-10-CM

## 2023-10-31 DIAGNOSIS — Z6837 Body mass index (BMI) 37.0-37.9, adult: Secondary | ICD-10-CM

## 2023-10-31 DIAGNOSIS — I10 Essential (primary) hypertension: Secondary | ICD-10-CM | POA: Diagnosis present

## 2023-10-31 DIAGNOSIS — Z85528 Personal history of other malignant neoplasm of kidney: Secondary | ICD-10-CM

## 2023-10-31 DIAGNOSIS — F05 Delirium due to known physiological condition: Secondary | ICD-10-CM | POA: Diagnosis not present

## 2023-10-31 DIAGNOSIS — F419 Anxiety disorder, unspecified: Secondary | ICD-10-CM | POA: Diagnosis present

## 2023-10-31 DIAGNOSIS — K219 Gastro-esophageal reflux disease without esophagitis: Secondary | ICD-10-CM | POA: Diagnosis present

## 2023-10-31 DIAGNOSIS — N281 Cyst of kidney, acquired: Secondary | ICD-10-CM | POA: Diagnosis present

## 2023-10-31 DIAGNOSIS — D62 Acute posthemorrhagic anemia: Secondary | ICD-10-CM | POA: Diagnosis not present

## 2023-10-31 DIAGNOSIS — D4101 Neoplasm of uncertain behavior of right kidney: Secondary | ICD-10-CM | POA: Diagnosis not present

## 2023-10-31 DIAGNOSIS — Z794 Long term (current) use of insulin: Secondary | ICD-10-CM

## 2023-10-31 DIAGNOSIS — C642 Malignant neoplasm of left kidney, except renal pelvis: Secondary | ICD-10-CM | POA: Diagnosis present

## 2023-10-31 DIAGNOSIS — L7632 Postprocedural hematoma of skin and subcutaneous tissue following other procedure: Secondary | ICD-10-CM | POA: Diagnosis not present

## 2023-10-31 DIAGNOSIS — Z515 Encounter for palliative care: Secondary | ICD-10-CM

## 2023-10-31 HISTORY — PX: ROBOT ASSISTED LAPAROSCOPIC NEPHRECTOMY: SHX5140

## 2023-10-31 HISTORY — PX: XI ROBOTIC ASSISTED VENTRAL HERNIA: SHX6789

## 2023-10-31 LAB — BASIC METABOLIC PANEL WITH GFR
Anion gap: 15 (ref 5–15)
Anion gap: 13 (ref 5–15)
BUN: <5 mg/dL — ABNORMAL LOW (ref 6–20)
BUN: 8 mg/dL (ref 6–20)
CO2: 21 mmol/L — ABNORMAL LOW (ref 22–32)
CO2: 23 mmol/L (ref 22–32)
Calcium: 9.1 mg/dL (ref 8.9–10.3)
Calcium: 8.4 mg/dL — ABNORMAL LOW (ref 8.9–10.3)
Chloride: 89 mmol/L — ABNORMAL LOW (ref 98–111)
Chloride: 90 mmol/L — ABNORMAL LOW (ref 98–111)
Creatinine, Ser: 0.43 mg/dL — ABNORMAL LOW (ref 0.61–1.24)
Creatinine, Ser: 1.56 mg/dL — ABNORMAL HIGH (ref 0.61–1.24)
GFR, Estimated: >60 mL/min (ref >60)
GFR, Estimated: 53 mL/min — ABNORMAL LOW (ref >60)
Glucose, Bld: 120 mg/dL — ABNORMAL HIGH (ref 70–99)
Glucose, Bld: 229 mg/dL — ABNORMAL HIGH (ref 70–99)
Potassium: 3.5 mmol/L (ref 3.5–5.1)
Potassium: 6.1 mmol/L — ABNORMAL HIGH (ref 3.5–5.1)
Sodium: 125 mmol/L — ABNORMAL LOW (ref 135–145)
Sodium: 126 mmol/L — ABNORMAL LOW (ref 135–145)

## 2023-10-31 LAB — TYPE AND SCREEN
ABO/RH(D): A POS
Antibody Screen: NEGATIVE

## 2023-10-31 LAB — GLUCOSE, CAPILLARY
Glucose-Capillary: 104 mg/dL — ABNORMAL HIGH (ref 70–99)
Glucose-Capillary: 228 mg/dL — ABNORMAL HIGH (ref 70–99)
Glucose-Capillary: 225 mg/dL — ABNORMAL HIGH (ref 70–99)
Glucose-Capillary: 241 mg/dL — ABNORMAL HIGH (ref 70–99)
Glucose-Capillary: 200 mg/dL — ABNORMAL HIGH (ref 70–99)
Glucose-Capillary: 138 mg/dL — ABNORMAL HIGH (ref 70–99)

## 2023-10-31 LAB — HEMOGLOBIN AND HEMATOCRIT, BLOOD
HCT: 45.2 % (ref 39.0–52.0)
Hemoglobin: 14.8 g/dL (ref 13.0–17.0)

## 2023-10-31 SURGERY — NEPHRECTOMY, RADICAL, ROBOT-ASSISTED, LAPAROSCOPIC, ADULT
Anesthesia: General | Laterality: Right

## 2023-10-31 MED ORDER — ROCURONIUM BROMIDE 10 MG/ML (PF) SYRINGE
PREFILLED_SYRINGE | INTRAVENOUS | Status: DC | PRN
  Administered 2023-10-31: 20 mg via INTRAVENOUS
  Administered 2023-10-31: 80 mg via INTRAVENOUS
  Administered 2023-10-31: 30 mg via INTRAVENOUS
  Administered 2023-10-31 (×4): 20 mg via INTRAVENOUS

## 2023-10-31 MED ORDER — MIDAZOLAM HCL 2 MG/2ML IJ SOLN
INTRAMUSCULAR | Status: AC
  Filled 2023-10-31: qty 2

## 2023-10-31 MED ORDER — FENTANYL CITRATE PF 50 MCG/ML IJ SOSY
25.0000 ug | PREFILLED_SYRINGE | INTRAMUSCULAR | Status: DC | PRN
  Administered 2023-10-31: 50 ug via INTRAVENOUS

## 2023-10-31 MED ORDER — METOPROLOL TARTRATE 25 MG PO TABS
50.0000 mg | ORAL_TABLET | Freq: Two times a day (BID) | ORAL | Status: DC
  Administered 2023-10-31 – 2023-11-04 (×8): 50 mg via ORAL
  Filled 2023-10-31 (×8): qty 1

## 2023-10-31 MED ORDER — FENTANYL CITRATE (PF) 250 MCG/5ML IJ SOLN
INTRAMUSCULAR | Status: AC
Start: 2023-10-31 — End: ?
  Filled 2023-10-31: qty 5

## 2023-10-31 MED ORDER — LACTATED RINGERS IV SOLN: INTRAVENOUS | Status: DC | PRN

## 2023-10-31 MED ORDER — DOCUSATE SODIUM 100 MG PO CAPS
100.0000 mg | ORAL_CAPSULE | Freq: Two times a day (BID) | ORAL | Status: DC
  Administered 2023-10-31: 100 mg via ORAL
  Filled 2023-10-31: qty 1

## 2023-10-31 MED ORDER — ENSURE PRE-SURGERY PO LIQD
592.0000 mL | Freq: Once | ORAL | Status: DC
  Filled 2023-10-31: qty 592

## 2023-10-31 MED ORDER — BUPIVACAINE HCL (PF) 0.25 % IJ SOLN
INTRAMUSCULAR | Status: DC | PRN
  Administered 2023-10-31: 60 mL

## 2023-10-31 MED ORDER — GABAPENTIN 300 MG PO CAPS
900.0000 mg | ORAL_CAPSULE | Freq: Two times a day (BID) | ORAL | Status: DC
  Administered 2023-10-31 – 2023-11-04 (×8): 900 mg via ORAL
  Filled 2023-10-31 (×8): qty 3

## 2023-10-31 MED ORDER — PROPOFOL 10 MG/ML IV BOLUS
INTRAVENOUS | Status: DC | PRN
  Administered 2023-10-31: 200 mg via INTRAVENOUS

## 2023-10-31 MED ORDER — 0.9 % SODIUM CHLORIDE (POUR BTL) OPTIME
TOPICAL | Status: DC | PRN
  Administered 2023-10-31: 1000 mL

## 2023-10-31 MED ORDER — ACETAMINOPHEN 325 MG PO TABS: 325.0000 mg | ORAL_TABLET | ORAL | Status: DC | PRN

## 2023-10-31 MED ORDER — CHLORHEXIDINE GLUCONATE CLOTH 2 % EX PADS: 6.0000 | MEDICATED_PAD | Freq: Once | CUTANEOUS | Status: DC

## 2023-10-31 MED ORDER — SUGAMMADEX SODIUM 200 MG/2ML IV SOLN
INTRAVENOUS | Status: DC | PRN
  Administered 2023-10-31: 400 mg via INTRAVENOUS

## 2023-10-31 MED ORDER — AMLODIPINE BESYLATE 10 MG PO TABS
10.0000 mg | ORAL_TABLET | Freq: Every day | ORAL | Status: DC
  Administered 2023-10-31 – 2023-11-03 (×4): 10 mg via ORAL
  Filled 2023-10-31 (×4): qty 1

## 2023-10-31 MED ORDER — OXYCODONE HCL 5 MG/5ML PO SOLN: 5.0000 mg | Freq: Once | ORAL | Status: DC | PRN

## 2023-10-31 MED ORDER — ONDANSETRON HCL 4 MG/2ML IJ SOLN
INTRAMUSCULAR | Status: DC | PRN
  Administered 2023-10-31: 4 mg via INTRAVENOUS

## 2023-10-31 MED ORDER — DULOXETINE HCL 30 MG PO CPEP
60.0000 mg | ORAL_CAPSULE | Freq: Every day | ORAL | Status: DC
  Administered 2023-10-31 – 2023-11-10 (×10): 60 mg via ORAL
  Filled 2023-10-31 (×4): qty 2
  Filled 2023-10-31 (×2): qty 1
  Filled 2023-10-31: qty 2
  Filled 2023-10-31: qty 1
  Filled 2023-10-31: qty 2
  Filled 2023-10-31: qty 1

## 2023-10-31 MED ORDER — PROPOFOL 10 MG/ML IV BOLUS
INTRAVENOUS | Status: AC
  Filled 2023-10-31: qty 20

## 2023-10-31 MED ORDER — DIPHENHYDRAMINE HCL 50 MG/ML IJ SOLN: 12.5000 mg | Freq: Four times a day (QID) | INTRAMUSCULAR | Status: DC | PRN

## 2023-10-31 MED ORDER — DEXAMETHASONE SODIUM PHOSPHATE 10 MG/ML IJ SOLN
INTRAMUSCULAR | Status: DC | PRN
  Administered 2023-10-31: 10 mg via INTRAVENOUS

## 2023-10-31 MED ORDER — DOCUSATE SODIUM 100 MG PO CAPS: 100.0000 mg | ORAL_CAPSULE | Freq: Two times a day (BID) | ORAL | Status: DC

## 2023-10-31 MED ORDER — ONDANSETRON HCL 4 MG/2ML IJ SOLN
INTRAMUSCULAR | Status: AC
  Filled 2023-10-31: qty 2

## 2023-10-31 MED ORDER — BUPIVACAINE-EPINEPHRINE (PF) 0.25% -1:200000 IJ SOLN
INTRAMUSCULAR | Status: AC
  Filled 2023-10-31: qty 60

## 2023-10-31 MED ORDER — ROCURONIUM BROMIDE 10 MG/ML (PF) SYRINGE
PREFILLED_SYRINGE | INTRAVENOUS | Status: AC
  Filled 2023-10-31: qty 10

## 2023-10-31 MED ORDER — OXYCODONE-ACETAMINOPHEN 10-325 MG PO TABS: 1.0000 | ORAL_TABLET | Freq: Four times a day (QID) | ORAL | 0 refills | Status: DC | PRN

## 2023-10-31 MED ORDER — ATORVASTATIN CALCIUM 40 MG PO TABS
40.0000 mg | ORAL_TABLET | Freq: Every evening | ORAL | Status: DC
  Administered 2023-10-31 – 2023-11-04 (×5): 40 mg via ORAL
  Filled 2023-10-31 (×5): qty 1

## 2023-10-31 MED ORDER — CEFAZOLIN SODIUM-DEXTROSE 2-4 GM/100ML-% IV SOLN
2.0000 g | INTRAVENOUS | Status: AC
  Administered 2023-10-31 (×2): 2 g via INTRAVENOUS
  Filled 2023-10-31: qty 100

## 2023-10-31 MED ORDER — ENSURE PRE-SURGERY PO LIQD
296.0000 mL | Freq: Once | ORAL | Status: DC
Start: 2023-11-01 — End: 2023-10-31
  Filled 2023-10-31: qty 296

## 2023-10-31 MED ORDER — ALLOPURINOL 100 MG PO TABS
300.0000 mg | ORAL_TABLET | Freq: Every day | ORAL | Status: DC
  Administered 2023-10-31 – 2023-11-04 (×5): 300 mg via ORAL
  Filled 2023-10-31 (×5): qty 3

## 2023-10-31 MED ORDER — ONDANSETRON HCL 4 MG/2ML IJ SOLN: 4.0000 mg | INTRAMUSCULAR | Status: DC | PRN

## 2023-10-31 MED ORDER — INSULIN ASPART 100 UNIT/ML IJ SOLN
0.0000 [IU] | INTRAMUSCULAR | Status: DC
  Administered 2023-10-31: 2 [IU] via SUBCUTANEOUS
  Administered 2023-10-31: 3 [IU] via SUBCUTANEOUS
  Administered 2023-11-01: 2 [IU] via SUBCUTANEOUS
  Administered 2023-11-01: 5 [IU] via SUBCUTANEOUS
  Administered 2023-11-01: 2 [IU] via SUBCUTANEOUS
  Administered 2023-11-01 (×2): 3 [IU] via SUBCUTANEOUS
  Administered 2023-11-02: 5 [IU] via SUBCUTANEOUS
  Administered 2023-11-02 (×2): 3 [IU] via SUBCUTANEOUS
  Administered 2023-11-02: 8 [IU] via SUBCUTANEOUS
  Administered 2023-11-02: 2 [IU] via SUBCUTANEOUS
  Administered 2023-11-02: 3 [IU] via SUBCUTANEOUS
  Administered 2023-11-03: 2 [IU] via SUBCUTANEOUS
  Administered 2023-11-03: 5 [IU] via SUBCUTANEOUS
  Administered 2023-11-03 (×2): 3 [IU] via SUBCUTANEOUS
  Administered 2023-11-03: 11 [IU] via SUBCUTANEOUS
  Administered 2023-11-03 – 2023-11-04 (×2): 2 [IU] via SUBCUTANEOUS
  Administered 2023-11-04 (×2): 3 [IU] via SUBCUTANEOUS
  Administered 2023-11-04: 2 [IU] via SUBCUTANEOUS
  Administered 2023-11-04 (×2): 3 [IU] via SUBCUTANEOUS
  Administered 2023-11-05: 2 [IU] via SUBCUTANEOUS
  Administered 2023-11-05: 3 [IU] via SUBCUTANEOUS
  Administered 2023-11-05: 2 [IU] via SUBCUTANEOUS
  Administered 2023-11-05 – 2023-11-06 (×6): 3 [IU] via SUBCUTANEOUS
  Administered 2023-11-06: 2 [IU] via SUBCUTANEOUS
  Administered 2023-11-06 (×2): 3 [IU] via SUBCUTANEOUS
  Administered 2023-11-06: 2 [IU] via SUBCUTANEOUS
  Administered 2023-11-07: 5 [IU] via SUBCUTANEOUS
  Administered 2023-11-07: 8 [IU] via SUBCUTANEOUS
  Administered 2023-11-07 (×2): 2 [IU] via SUBCUTANEOUS
  Administered 2023-11-08: 3 [IU] via SUBCUTANEOUS
  Administered 2023-11-08: 5 [IU] via SUBCUTANEOUS
  Administered 2023-11-08: 3 [IU] via SUBCUTANEOUS
  Administered 2023-11-08: 8 [IU] via SUBCUTANEOUS
  Administered 2023-11-08: 5 [IU] via SUBCUTANEOUS
  Administered 2023-11-08: 3 [IU] via SUBCUTANEOUS
  Administered 2023-11-09 (×2): 8 [IU] via SUBCUTANEOUS
  Administered 2023-11-09: 5 [IU] via SUBCUTANEOUS

## 2023-10-31 MED ORDER — FENTANYL CITRATE PF 50 MCG/ML IJ SOSY
PREFILLED_SYRINGE | INTRAMUSCULAR | Status: AC
  Filled 2023-10-31: qty 1

## 2023-10-31 MED ORDER — ALBUTEROL SULFATE (2.5 MG/3ML) 0.083% IN NEBU
2.5000 mg | INHALATION_SOLUTION | Freq: Four times a day (QID) | RESPIRATORY_TRACT | Status: DC | PRN
Start: 2023-10-31 — End: 2023-11-15
  Administered 2023-11-01 (×2): 2.5 mg via RESPIRATORY_TRACT
  Filled 2023-10-31 (×2): qty 3

## 2023-10-31 MED ORDER — ACETAMINOPHEN 160 MG/5ML PO SOLN: 325.0000 mg | ORAL | Status: DC | PRN

## 2023-10-31 MED ORDER — PANTOPRAZOLE SODIUM 40 MG PO TBEC
80.0000 mg | DELAYED_RELEASE_TABLET | Freq: Every day | ORAL | Status: DC
  Administered 2023-10-31 – 2023-11-04 (×5): 80 mg via ORAL
  Filled 2023-10-31 (×5): qty 2

## 2023-10-31 MED ORDER — ACETAMINOPHEN 10 MG/ML IV SOLN
1000.0000 mg | Freq: Four times a day (QID) | INTRAVENOUS | Status: DC
  Administered 2023-10-31: 1000 mg via INTRAVENOUS
  Filled 2023-10-31: qty 100

## 2023-10-31 MED ORDER — LIDOCAINE HCL (PF) 2 % IJ SOLN
INTRAMUSCULAR | Status: DC | PRN
  Administered 2023-10-31: 60 mg via INTRADERMAL

## 2023-10-31 MED ORDER — CYCLOBENZAPRINE HCL 5 MG PO TABS
5.0000 mg | ORAL_TABLET | Freq: Three times a day (TID) | ORAL | Status: DC | PRN
  Administered 2023-11-03: 5 mg via ORAL
  Filled 2023-10-31: qty 1

## 2023-10-31 MED ORDER — GABAPENTIN 300 MG PO CAPS
300.0000 mg | ORAL_CAPSULE | ORAL | Status: DC
  Filled 2023-10-31: qty 1

## 2023-10-31 MED ORDER — MAGNESIUM CITRATE PO SOLN: 1.0000 | Freq: Once | ORAL | Status: DC

## 2023-10-31 MED ORDER — SODIUM CHLORIDE 0.9 % IV SOLN: INTRAVENOUS | Status: DC

## 2023-10-31 MED ORDER — INSULIN ASPART 100 UNIT/ML IJ SOLN
0.0000 [IU] | INTRAMUSCULAR | Status: DC | PRN
Start: 2023-10-31 — End: 2023-10-31

## 2023-10-31 MED ORDER — ALBUTEROL SULFATE (2.5 MG/3ML) 0.083% IN NEBU
INHALATION_SOLUTION | RESPIRATORY_TRACT | Status: AC
Start: 2023-10-31 — End: 2023-10-31
  Filled 2023-10-31: qty 3

## 2023-10-31 MED ORDER — BUSPIRONE HCL 5 MG PO TABS
15.0000 mg | ORAL_TABLET | Freq: Two times a day (BID) | ORAL | Status: DC
  Administered 2023-10-31 – 2023-11-04 (×8): 15 mg via ORAL
  Filled 2023-10-31 (×8): qty 1

## 2023-10-31 MED ORDER — INSULIN ASPART 100 UNIT/ML IJ SOLN
INTRAMUSCULAR | Status: AC
Start: 2023-10-31 — End: 2023-11-01
  Filled 2023-10-31: qty 1

## 2023-10-31 MED ORDER — BUPIVACAINE LIPOSOME 1.3 % IJ SUSP
INTRAMUSCULAR | Status: DC | PRN
  Administered 2023-10-31: 20 mL

## 2023-10-31 MED ORDER — ALBUTEROL SULFATE (2.5 MG/3ML) 0.083% IN NEBU: 2.5000 mg | INHALATION_SOLUTION | Freq: Four times a day (QID) | RESPIRATORY_TRACT | Status: DC | PRN

## 2023-10-31 MED ORDER — OXYCODONE HCL 5 MG PO TABS: 5.0000 mg | ORAL_TABLET | Freq: Once | ORAL | Status: DC | PRN

## 2023-10-31 MED ORDER — SUCCINYLCHOLINE CHLORIDE 200 MG/10ML IV SOSY
PREFILLED_SYRINGE | INTRAVENOUS | Status: DC | PRN
Start: 2023-10-31 — End: 2023-10-31
  Administered 2023-10-31: 180 mg via INTRAVENOUS

## 2023-10-31 MED ORDER — DIPHENHYDRAMINE HCL 12.5 MG/5ML PO ELIX
12.5000 mg | ORAL_SOLUTION | Freq: Four times a day (QID) | ORAL | Status: DC | PRN
  Administered 2023-11-01: 25 mg via ORAL
  Filled 2023-10-31: qty 10

## 2023-10-31 MED ORDER — MIDAZOLAM HCL 5 MG/5ML IJ SOLN
INTRAMUSCULAR | Status: DC | PRN
  Administered 2023-10-31: 2 mg via INTRAVENOUS

## 2023-10-31 MED ORDER — HYDROMORPHONE HCL 1 MG/ML IJ SOLN
0.5000 mg | INTRAMUSCULAR | Status: DC | PRN
  Administered 2023-10-31 – 2023-11-01 (×2): 1 mg via INTRAVENOUS
  Filled 2023-10-31 (×2): qty 1

## 2023-10-31 MED ORDER — ACETAMINOPHEN 500 MG PO TABS
1000.0000 mg | ORAL_TABLET | ORAL | Status: AC
  Administered 2023-10-31: 1000 mg via ORAL
  Filled 2023-10-31: qty 2

## 2023-10-31 MED ORDER — ALBUTEROL SULFATE (2.5 MG/3ML) 0.083% IN NEBU
2.5000 mg | INHALATION_SOLUTION | Freq: Once | RESPIRATORY_TRACT | Status: AC
  Administered 2023-10-31: 2.5 mg via RESPIRATORY_TRACT

## 2023-10-31 MED ORDER — CEFAZOLIN SODIUM-DEXTROSE 2-4 GM/100ML-% IV SOLN: 2.0000 g | INTRAVENOUS | Status: DC

## 2023-10-31 MED ORDER — ACETAMINOPHEN 10 MG/ML IV SOLN
INTRAVENOUS | Status: AC
Start: 2023-10-31 — End: 2023-11-01
  Filled 2023-10-31: qty 100

## 2023-10-31 MED ORDER — BUPIVACAINE LIPOSOME 1.3 % IJ SUSP: 20.0000 mL | Freq: Once | INTRAMUSCULAR | Status: DC

## 2023-10-31 MED ORDER — BUPIVACAINE LIPOSOME 1.3 % IJ SUSP
INTRAMUSCULAR | Status: AC
  Filled 2023-10-31: qty 20

## 2023-10-31 MED ORDER — STERILE WATER FOR IRRIGATION IR SOLN
Status: DC | PRN
  Administered 2023-10-31: 1000 mL

## 2023-10-31 MED ORDER — LIDOCAINE 2% (20 MG/ML) 5 ML SYRINGE: INTRAMUSCULAR | Status: DC | PRN

## 2023-10-31 MED ORDER — FENTANYL CITRATE (PF) 100 MCG/2ML IJ SOLN
INTRAMUSCULAR | Status: DC | PRN
  Administered 2023-10-31 (×2): 50 ug via INTRAVENOUS
  Administered 2023-10-31: 100 ug via INTRAVENOUS
  Administered 2023-10-31: 50 ug via INTRAVENOUS

## 2023-10-31 MED ORDER — HEMOSTATIC AGENTS (NO CHARGE) OPTIME
TOPICAL | Status: DC | PRN
  Administered 2023-10-31: 1 via TOPICAL

## 2023-10-31 MED ORDER — DROPERIDOL 2.5 MG/ML IJ SOLN: 0.6250 mg | Freq: Once | INTRAMUSCULAR | Status: DC | PRN

## 2023-10-31 MED ORDER — INSULIN ASPART 100 UNIT/ML IJ SOLN
4.0000 [IU] | Freq: Once | INTRAMUSCULAR | Status: AC
  Administered 2023-10-31: 4 [IU] via SUBCUTANEOUS

## 2023-10-31 MED ORDER — CEFAZOLIN SODIUM-DEXTROSE 1-4 GM/50ML-% IV SOLN
1.0000 g | Freq: Three times a day (TID) | INTRAVENOUS | Status: AC
  Administered 2023-10-31 – 2023-11-01 (×2): 1 g via INTRAVENOUS
  Filled 2023-10-31 (×2): qty 50

## 2023-10-31 MED ORDER — ACETAMINOPHEN 10 MG/ML IV SOLN
1000.0000 mg | Freq: Once | INTRAVENOUS | Status: DC | PRN
  Administered 2023-10-31: 1000 mg via INTRAVENOUS

## 2023-10-31 MED ORDER — FLUTICASONE FUROATE-VILANTEROL 100-25 MCG/ACT IN AEPB
1.0000 | INHALATION_SPRAY | Freq: Every day | RESPIRATORY_TRACT | Status: DC
  Administered 2023-11-01 – 2023-11-02 (×2): 1 via RESPIRATORY_TRACT
  Filled 2023-10-31: qty 28

## 2023-10-31 MED ORDER — IRBESARTAN 150 MG PO TABS
75.0000 mg | ORAL_TABLET | Freq: Every day | ORAL | Status: DC
  Administered 2023-10-31: 75 mg via ORAL
  Filled 2023-10-31: qty 1

## 2023-10-31 MED ORDER — FLEET ENEMA RE ENEM: 1.0000 | ENEMA | Freq: Once | RECTAL | Status: DC

## 2023-10-31 SURGICAL SUPPLY — 92 items
APPLICATOR SURGIFLO ENDO (HEMOSTASIS) ×2 IMPLANT
APPLIER CLIP 5 13 M/L LIGAMAX5 (MISCELLANEOUS) IMPLANT
BAG COUNTER SPONGE SURGICOUNT (BAG) ×2 IMPLANT
BAG LAPAROSCOPIC 12 15 PORT 16 (BASKET) IMPLANT
BAG RETRIEVAL 12/15 (BASKET) IMPLANT
BINDER ABDOMINAL 12 ML 46-62 (SOFTGOODS) IMPLANT
CABLE HIGH FREQUENCY MONO STRZ (ELECTRODE) ×2 IMPLANT
CHLORAPREP W/TINT 26 (MISCELLANEOUS) ×4 IMPLANT
CLIP APPLIE 5 13 M/L LIGAMAX5 (MISCELLANEOUS) IMPLANT
CLIP LIGATING HEM O LOK PURPLE (MISCELLANEOUS) ×2 IMPLANT
CLIP LIGATING HEMO O LOK GREEN (MISCELLANEOUS) ×4 IMPLANT
COVER SURGICAL LIGHT HANDLE (MISCELLANEOUS) ×4 IMPLANT
COVER TIP SHEARS 8 DVNC (MISCELLANEOUS) ×2 IMPLANT
DERMABOND ADVANCED .7 DNX12 (GAUZE/BANDAGES/DRESSINGS) ×4 IMPLANT
DEVICE SECURE STRAP 25 ABSORB (INSTRUMENTS) ×2 IMPLANT
DEVICE TROCAR PUNCTURE CLOSURE (ENDOMECHANICALS) ×2 IMPLANT
DRAIN CHANNEL 15F RND FF 3/16 (WOUND CARE) IMPLANT
DRAIN CHANNEL 19F RND (DRAIN) IMPLANT
DRAPE ARM DVNC X/XI (DISPOSABLE) ×8 IMPLANT
DRAPE COLUMN DVNC XI (DISPOSABLE) ×2 IMPLANT
DRAPE INCISE IOBAN 66X45 STRL (DRAPES) ×2 IMPLANT
DRAPE SHEET LG 3/4 BI-LAMINATE (DRAPES) ×2 IMPLANT
DRAPE WARM FLUID 44X44 (DRAPES) ×2 IMPLANT
DRIVER NDL LRG 8 DVNC XI (INSTRUMENTS) ×4 IMPLANT
DRIVER NDLE LRG 8 DVNC XI (INSTRUMENTS) ×4 IMPLANT
DRSG TEGADERM 2-3/8X2-3/4 SM (GAUZE/BANDAGES/DRESSINGS) ×6 IMPLANT
DRSG TEGADERM 4X4.75 (GAUZE/BANDAGES/DRESSINGS) ×2 IMPLANT
ELECT PENCIL ROCKER SW 15FT (MISCELLANEOUS) ×2 IMPLANT
ELECT REM PT RETURN 15FT ADLT (MISCELLANEOUS) ×4 IMPLANT
EVACUATOR SILICONE 100CC (DRAIN) IMPLANT
FORCEPS BPLR 8 MD DVNC XI (FORCEP) ×2 IMPLANT
FORCEPS PROGRASP DVNC XI (FORCEP) ×2 IMPLANT
GAUZE SPONGE 2X2 8PLY STRL LF (GAUZE/BANDAGES/DRESSINGS) IMPLANT
GLOVE BIO SURGEON STRL SZ 6.5 (GLOVE) ×2 IMPLANT
GLOVE ECLIPSE 8.0 STRL XLNG CF (GLOVE) ×2 IMPLANT
GLOVE INDICATOR 8.0 STRL GRN (GLOVE) ×2 IMPLANT
GLOVE SURG LX STRL 7.5 STRW (GLOVE) ×4 IMPLANT
GOWN SRG XL LVL 4 BRTHBL STRL (GOWNS) ×2 IMPLANT
GOWN STRL REUS W/ TWL XL LVL3 (GOWN DISPOSABLE) ×10 IMPLANT
HOLDER FOLEY CATH W/STRAP (MISCELLANEOUS) ×2 IMPLANT
IRRIG SUCT STRYKERFLOW 2 WTIP (MISCELLANEOUS) ×2 IMPLANT
IRRIGATION SUCT STRKRFLW 2 WTP (MISCELLANEOUS) ×2 IMPLANT
KIT BASIN OR (CUSTOM PROCEDURE TRAY) ×4 IMPLANT
KIT TURNOVER KIT A (KITS) IMPLANT
MARKER SKIN DUAL TIP RULER LAB (MISCELLANEOUS) ×2 IMPLANT
MESH VENTRALIGHT ST 10X13IN (Mesh General) IMPLANT
NDL INSUFFLATION 14GA 120MM (NEEDLE) IMPLANT
NDL SPNL 22GX3.5 QUINCKE BK (NEEDLE) IMPLANT
NEEDLE INSUFFLATION 14GA 120MM (NEEDLE) IMPLANT
NEEDLE SPNL 22GX3.5 QUINCKE BK (NEEDLE) ×2 IMPLANT
PAD POSITIONING PINK XL (MISCELLANEOUS) ×2 IMPLANT
PROTECTOR NERVE ULNAR (MISCELLANEOUS) ×4 IMPLANT
SCISSORS LAP 5X35 DISP (ENDOMECHANICALS) ×2 IMPLANT
SCISSORS MNPLR CVD DVNC XI (INSTRUMENTS) ×2 IMPLANT
SEAL UNIV 5-12 XI (MISCELLANEOUS) ×8 IMPLANT
SET TUBE SMOKE EVAC HIGH FLOW (TUBING) ×4 IMPLANT
SLEEVE ADV FIXATION 12X100MM (TROCAR) IMPLANT
SLEEVE ADV FIXATION 5X100MM (TROCAR) ×2 IMPLANT
SOL ELECTROSURG ANTI STICK (MISCELLANEOUS) ×2 IMPLANT
SOLUTION ELECTROSURG ANTI STCK (MISCELLANEOUS) ×2 IMPLANT
SPIKE FLUID TRANSFER (MISCELLANEOUS) ×4 IMPLANT
STRIP CLOSURE SKIN 1/2X4 (GAUZE/BANDAGES/DRESSINGS) ×4 IMPLANT
SURGIFLO W/THROMBIN 8M KIT (HEMOSTASIS) IMPLANT
SUT ETHILON 3 0 PS 1 (SUTURE) IMPLANT
SUT MNCRL AB 4-0 PS2 18 (SUTURE) ×6 IMPLANT
SUT PDS AB 0 CTX 36 PDP370T (SUTURE) IMPLANT
SUT PDS AB 1 CT1 27 (SUTURE) ×10 IMPLANT
SUT PROLENE 1 CT 1 30 (SUTURE) IMPLANT
SUT PROLENE 2 0 SH DA (SUTURE) IMPLANT
SUT STRATA PDS 2-0 15 CT-2.5 (SUTURE) ×4 IMPLANT
SUT STRATA PDS 2-0 30 CT-1.5 (SUTURE) ×4 IMPLANT
SUT V-LOC BARB 180 2/0GR6 GS22 (SUTURE) ×2 IMPLANT
SUT VIC AB 0 CT1 27XBRD ANTBC (SUTURE) ×2 IMPLANT
SUT VIC AB 2-0 SH 18 (SUTURE) IMPLANT
SUT VIC AB 2-0 SH 27X BRD (SUTURE) IMPLANT
SUT VICRYL 0 UR6 27IN ABS (SUTURE) ×2 IMPLANT
SUT VLOC BARB 180 ABS3/0GR12 (SUTURE) ×4 IMPLANT
SUTURE STRAT PDS 2-0 15 CT-2.5 (SUTURE) IMPLANT
SUTURE STRAT PDS 2-0 30 CT-1.5 (SUTURE) IMPLANT
SUTURE V-LC BRB 180 2/0GR6GS22 (SUTURE) ×2 IMPLANT
SUTURE VLOC BRB 180 ABS3/0GR12 (SUTURE) ×2 IMPLANT
SYS BAG RETRIEVAL 10MM (BASKET) ×2 IMPLANT
SYSTEM BAG RETRIEVAL 10MM (BASKET) IMPLANT
TOWEL OR 17X26 10 PK STRL BLUE (TOWEL DISPOSABLE) ×4 IMPLANT
TRAY FOLEY MTR SLVR 16FR STAT (SET/KITS/TRAYS/PACK) ×2 IMPLANT
TRAY LAPAROSCOPIC (CUSTOM PROCEDURE TRAY) ×4 IMPLANT
TROCAR 11X100 Z THREAD (TROCAR) IMPLANT
TROCAR 12M 150ML BLUNT (TROCAR) IMPLANT
TROCAR ADV FIXATION 5X100MM (TROCAR) ×2 IMPLANT
TROCAR XCEL NON-BLD 5MMX100MML (ENDOMECHANICALS) ×2 IMPLANT
TROCAR Z THREAD OPTICAL 12X100 (TROCAR) ×2 IMPLANT
WATER STERILE IRR 1000ML POUR (IV SOLUTION) ×2 IMPLANT

## 2023-10-31 NOTE — Op Note (Signed)
 10/31/2023  PATIENT:  Preston [name]  53 y.o. male  Patient Care Team: Claiborne Rigg, NP as PCP - General (Nurse Practitioner) Jake Bathe, MD as PCP - Cardiology (Cardiology) Willis Modena, MD as Consulting Physician (Gastroenterology) Karie Soda, MD as Consulting Physician (General Surgery) Heloise Purpura, MD as Consulting Physician (Urology)  PRE-OPERATIVE DIAGNOSIS:  VENTRAL INCISIONAL INCARCERATED HERNIA Dimensions of hernia by pre-op evaluation:  8cm x 7cm  POST-OPERATIVE DIAGNOSIS:  VENTRAL INCISIONAL INCARCERATED HERNIA  Dimensions of hernia post-op:  15cm x 7cm  PROCEDURE:   ROBOTIC & LAPAROSCOPIC REPAIR OF ABDOMINAL HERNIA WITH MESH  (See OR Findings below) TAP BLOCK - BILATERAL  SURGEON:  Ardeth Sportsman, MD  ASSISTANT:  (n/a)   ANESTHESIA:  General endotracheal intubation anesthesia (GETA) and Regional TRANSVERSUS ABDOMINIS PLANE (TAP) nerve block -BILATERAL for perioperative & postoperative pain control at the level of the transverse abdominis & preperitoneal spaces along the flank at the anterior axillary line, from subcostal ridge to iliac crest under laparoscopic guidance provided with liposomal bupivacaine (Experel) 20mL mixed with 60 mL of bupivicaine 0.25% with epinephrine  Estimated Blood Loss (EBL):   Total I/O In: 2000 [I.V.:2000] Out: 850 [Urine:400; Blood:450].   (See anesthesia record)  Delay start of Pharmacological VTE agent (>24hrs) due to concerns of significant anemia, surgical blood loss, or risk of bleeding?:  no  DRAINS: (None)  SPECIMEN:  Hernia sac (sent)  DISPOSITION OF SPECIMEN:  Pathology  COUNTS:  Sponge, needle, & instrument counts CORRECT  PLAN OF CARE: Admit for overnight observation  PATIENT DISPOSITION:  PACU - hemodynamically stable.  INDICATION: Pleasant patient has developed a ventral wall abdominal hernia.  Incisional incarcerated with large volume of transverse colon.  Recommendation was made for surgical  repair  The anatomy & physiology of the abdominal wall was discussed. The pathophysiology of hernias was discussed. Natural history risks without surgery including progeressive enlargement, pain, incarceration & strangulation was discussed. Contributors to complications such as smoking, obesity, diabetes, prior surgery, etc were discussed.  I feel the risks of no intervention will lead to serious problems that outweigh the operative risks; therefore, I recommended surgery to reduce and repair the hernia. I explained laparoscopic techniques with possible need for an open approach. I noted the probable use of mesh to patch and/or buttress the hernia repair.  Risks such as bleeding, infection, abscess, need for further treatment, heart attack, death, and other risks were discussed. I noted a good likelihood this will help address the problem. Goals of post-operative recovery were discussed as well. Possibility that this will not correct all symptoms was explained. I stressed the importance of low-impact activity, aggressive pain control, avoiding constipation, & not pushing through pain to minimize risk of post-operative chronic pain or injury. Possibility of reherniation especially with smoking, obesity, diabetes, immunosuppression, and other health conditions was discussed. We will work to minimize complications.   An educational handout further explaining the pathology & treatment options was given as well. Questions were answered. The patient expresses understanding & wishes to proceed with surgery.   OR FINDINGS: Large supraumbilical incisional hernia incarcerated with a foot of transverse colon and greater omentum.  Swiss cheese hernias going down infraumbilically connecting with his prior low midline incision as well.  Umbilical hernia.  Type of ventral wall repair:  Laparoscopic underlay repair .with Primary repair of largest hernia Placement of mesh: Centrally intraperitoneal with edges tucked  into RECTRORECTUS & preperitoneal space Name of mesh: Bard Ventralight dual sided (polypropylene / Seprafilm)  Size of mesh: 33x27cm Orientation: Vertical Mesh overlap:  5-7cm    DESCRIPTION:   Informed consent was confirmed. The patient underwent general anaesthesia without difficulty. The patient was positioned appropriately. VTE prevention in place.  Patient positioned left side down decubitus underwent robotic partial nephrectomy per Dr. Laverle Patter with [facility].  Please see his separate operative note.  At the conclusion of that, the patient was repositioned supine with right arm tucked & left arm out.  The patient's abdomen was clipped, prepped, & draped in a sterile fashion. Surgical timeout confirmed our plan.  The patient was positioned in reverse Trendelenburg. Abdominal entry was gained using optical entry technique in the left upper abdomen. Entry was clean. I induced carbon dioxide insufflation. Camera inspection revealed no injury. Extra ports were carefully placed under direct laparoscopic visualization.  Xi robot was carefully docked.  I could see adhesions on the parietal peritoneum under the abdominal wall.  Could see hernias at the umbilicus and infraumbilically as well Swiss cheese pattern.  Clear the transverse colon and greater omentum were going into the larger supraumbilical hernia.  I did robotic lysis of adhesions to expose the entire anterior abdominal wall.  I primarily used focused sharp dissection.  I had to circumferentially free off and get the hernia sac to pull down the large swath of incarcerated colon and greater omentum gradually.  Finally got a release and got the transverse colon reducing greater omentum down.  I did inspection to ensure no injury.    I freed off the falciform ligament and central peritoneum to expose the retrorectus fascia   freed off in the subxiphoid region in the suprapubic region along the midline.  I tried to get flaps in the peritoneum  more laterally but it was very fibrotic with not great planes.  I decided to leave the lateral peritoneum on the right and left intact.  I made sure hemostasis was good.  Again numerous hernias noted, the dominant one supraumbilical incisional where the large volume of colon had been incarcerated.  I mapped out the region using a needle passer.   To minimize recurrence & ensure that I would have at least 5 cm radial coverage beyond the hernia defect, I chose a 33x27cm dual sided mesh.    Placed in the abdomen and tried to robotically coordinated but it did not lay well and visualization was poor.  Therefore undocked the robot and completed the case laparoscopically.  I remove the mesh.  I placed #1 PDS stitches around its edge x 8 total.  I rolled the mesh & placed into the peritoneal cavity through the hernia defect.  I unrolled the mesh and positioned it appropriately.  I secured the mesh to cover up the hernia defect using a laparoscopic suture passer to pass the tails of the PDS through the abdominal wall & tagged them with clamps for good transfascial suturing.  I started out in four corners to make sure I had the mesh centered under the hernia defect appropriately, and then proceeded to work in quadrants.    We evacuated CO2 & desufflated the abdomen.  I tied the fascial stitches down. I closed the fascial defect that I placed the mesh through using #1 PDS primarily running suturetransversely.  I reinsufflated the abdomen. The mesh provided at least circumferential coverage around the entire region of hernia defects.  I secured the mesh centrally with an additional trans fascial stitch in & out the mesh to help close the periumbilical hernia as  well using #1 PDS under laparoscopic visualization.   I tacked the edges & central part of the mesh peritoneum/posterior rectus fascia with SecureStrap absorbable tacks.   I did reinspection. Hemostasis was good. Mesh laid well. I completed a broad field block of  local anesthesia at fascial stitch sites & fascial closure areas.    Capnoperitoneum was evacuated. Ports were removed. The skin was closed with Monocryl at the port sites and Steri-Strips on the fascial stitch puncture sites.  Patient is being extubated to go to the recovery room.  I discussed operative findings, updated the patient's status, discussed probable steps to recovery, and gave postoperative recommendations to the patient's significant other, Jennifer.  Recommendations were made.  Questions were answered.  She expressed understanding & appreciation.  Ardeth Sportsman, M.D., F.A.C.S. Gastrointestinal and Minimally Invasive Surgery [facility] [address], [address] [place], [place] [address] [phone] Main / Paging  10/31/2023 1:33 PM

## 2023-10-31 NOTE — Anesthesia Postprocedure Evaluation (Signed)
 Anesthesia Post Note  Patient: Quinnton Bury Oesterling  Procedure(s) Performed: XI RIGHT ROBOTIC ASSISTED LAPAROSCOPIC PARTIAL NEPHRECTOMY (Right) XI ROBOTIC ASSISTED INCISIONAL HERNIA REPAIR VS LAPAROSCOPIC HERNIA REPAIR     Patient location during evaluation: PACU Anesthesia Type: General Level of consciousness: awake and alert Pain management: pain level controlled Vital Signs Assessment: post-procedure vital signs reviewed and stable Respiratory status: spontaneous breathing, nonlabored ventilation, respiratory function stable and patient connected to nasal cannula oxygen Cardiovascular status: blood pressure returned to baseline and stable Postop Assessment: no apparent nausea or vomiting Anesthetic complications: no  No notable events documented.  Last Vitals:  Vitals:   10/31/23 1345 10/31/23 1400  BP: 104/75 (!) 141/87  Pulse: 70 72  Resp: 18 (!) 23  Temp: 37.1 C   SpO2: 91% 90%    Last Pain:  Vitals:   10/31/23 1400  TempSrc:   PainSc: Asleep                 Trevor Iha

## 2023-10-31 NOTE — Discharge Instructions (Addendum)
 HERNIA REPAIR: POST OP INSTRUCTIONS  ######################################################################  EAT Gradually transition to a high fiber diet with a fiber supplement over the next few weeks after discharge.  Start with a pureed / full liquid diet (see below)  WALK Walk an hour a day.  Control your pain to do that.    CONTROL PAIN Control pain so that you can walk, sleep, tolerate sneezing/coughing, and go up/down stairs.  HAVE A BOWEL MOVEMENT DAILY Keep your bowels regular to avoid problems.  OK to try a laxative to override constipation.  OK to use an antidairrheal to slow down diarrhea.  Call if not better after 2 tries  CALL IF YOU HAVE PROBLEMS/CONCERNS Call if you are still struggling despite following these instructions. Call if you have concerns not answered by these instructions  ######################################################################    DIET: Follow a light bland diet & liquids the first 24 hours after arrival home, such as soup, liquids, starches, etc.  Be sure to drink plenty of fluids.  Quickly advance to a usual solid diet within a few days.  Avoid fast food or heavy meals as your are more likely to get nauseated or have irregular bowels.  A low-fat, high-fiber diet for the rest of your life is ideal.   Take your usually prescribed home medications unless otherwise directed.  PAIN CONTROL: Pain is best controlled by a usual combination of three different methods TOGETHER: Ice/Heat Over the counter pain medication Prescription pain medication Most patients will experience some swelling and bruising around the hernia(s) such as the bellybutton, groins, or old incisions.  Ice packs or heating pads (30-60 minutes up to 6 times a day) will help. Use ice for the first few days to help decrease swelling and bruising, then switch to heat to help relax tight/sore spots and speed recovery.  Some people prefer to use ice alone, heat alone, alternating  between ice & heat.  Experiment to what works for you.  Swelling and bruising can take several weeks to resolve.   It is helpful to take an over-the-counter pain medication regularly for the first few weeks.  Choose one of the following that works best for you: Naproxen (Aleve, etc)  Two 220mg  tabs twice a day Ibuprofen (Advil, etc) Three 200mg  tabs four times a day (every meal & bedtime) Acetaminophen (Tylenol, etc) 325-650mg  four times a day (every meal & bedtime) A  prescription for pain medication should be given to you upon discharge.  Take your pain medication as prescribed.  If you are having problems/concerns with the prescription medicine (does not control pain, nausea, vomiting, rash, itching, etc), please call us [phone] to see if we need to switch you to a different pain medicine that will work better for you and/or control your side effect better. If you need a refill on your pain medication, please contact your pharmacy.  They will contact our office to request authorization. Prescriptions will not be filled after 5 pm or on week-ends.  AVOID GETTING CONSTIPATED.   Between the surgery and the pain medications, it is common to experience some constipation.  Drink plenty of liquids Take a fiber supplement 2 times day (such as Metamucil, Citrucel, FiberCon, MiraLax, etc) to have a bowel movement every day. If you have not had a BM by 2 days after surgery: -drink liquids only until you have a bowel movement -take MiraLAX 2 doses every 2 hours until you have a bowel movement   Wash / shower every day.  You may  shower over the dressings as they are waterproof.    It is good for closed incisions and even open wounds to be washed every day.  Shower every day.  Short baths are fine.  Wash the incisions and wounds clean with soap & water.    You may leave closed incisions open to air if it is dry.   You may cover the incision with clean gauze & replace it after your daily shower for  comfort.  TEGADERM & STERISTRIPS:  You have clear gauze band-aid dressings over your closed incision(s).  You also have skin tapes called Steristrips on your incisions.  Leave these in place.  You may trim any edges that curl up with clean scissors.  You may remove all dressings & tapes in the shower in 2 days = 4/5.    ACTIVITIES as tolerated:   You may resume regular (light) daily activities beginning the next day--such as daily self-care, walking, climbing stairs--gradually increasing activities as tolerated.  Control your pain so that you can walk an hour a day.  If you can walk 30 minutes without difficulty, it is safe to try more intense activity such as jogging, treadmill, bicycling, low-impact aerobics, swimming, etc. Save the most intensive and strenuous activity for last such as sit-ups, heavy lifting, contact sports, etc  Refrain from any heavy lifting or straining until you are off narcotics for pain control.   DO NOT PUSH THROUGH PAIN.  Let pain be your guide: If it hurts to do something, don't do it.  Pain is your body warning you to avoid that activity for another week until the pain goes down. You may drive when you are no longer taking prescription pain medication, you can comfortably wear a seatbelt, and you can safely maneuver your car and apply brakes. You may have sexual intercourse when it is comfortable.   FOLLOW UP in our office Please call [facility] at [phone] to set up an appointment to see your surgeon in the office for a follow-up appointment approximately 2-3 weeks after your surgery. Make sure that you call for this appointment the day you arrive home to insure a convenient appointment time.  9.  If you have disability of FMLA / Family leave forms, please bring the forms to the office for processing.  (do not give to your surgeon).  WHEN TO CALL us [phone]: Poor pain control Reactions / problems with new medications (rash/itching, nausea, etc)  Fever over  101.5 F (38.5 C) Inability to urinate Nausea and/or vomiting Worsening swelling or bruising Continued bleeding from incision. Increased pain, redness, or drainage from the incision   The clinic staff is available to answer your questions during regular business hours (8:30am-5pm).  Please don't hesitate to call and ask to speak to one of our nurses for clinical concerns.   If you have a medical emergency, go to the nearest emergency room or call 911.  A surgeon from [facility] is always on call at the hospitals in [facility], [place] [address], [place], [place]  [address] ?  [address] [address], [place], [place]   [address] MAIN: [phone] ? TOLL FREE: [phone] ? FAX: [phone] [email or web address]

## 2023-10-31 NOTE — Op Note (Signed)
 Preoperative diagnosis: Right renal neoplasm  Postoperative diagnosis: Right renal neoplasm  Procedure:  Right robotic-assisted laparoscopic partial nephrectomy Intraoperative renal ultrasonography  Surgeon: Moody Bruins. M.D.  Assistant(s): Harrie Foreman, PA-C  An assistant was required for this surgical procedure.  The duties of the assistant included but were not limited to suctioning, passing suture, camera manipulation, retraction. This procedure would not be able to be performed without an Geophysicist/field seismologist.  Anesthesia: General  Complications: None  EBL: 500 mL  IVF:  2500 mL crystalloid  Specimens: Right renal neoplasm  Disposition of specimens: Pathology  Intraoperative findings:       1. Warm renal ischemia time: 22 minutes       2. Intraoperative renal ultrasound findings: There was a small cystic appearing mass that measured about 1 cm adjacent to a larger renal cyst.  Drains: # 15 Blake perinephric drain  Indication:  Preston [name] is a 53 y.o. year old patient with a right renal mass.  After a thorough review of the management options for their renal mass, they elected to proceed with surgical treatment and the above procedure.  We have discussed the potential benefits and risks of the procedure, side effects of the proposed treatment, the likelihood of the patient achieving the goals of the procedure, and any potential problems that might occur during the procedure or recuperation. Informed consent has been obtained.   Description of procedure:  The patient was taken to the operating room and a general anesthetic was administered. The patient was given preoperative antibiotics, placed in the right modified flank position with care to pad all potential pressure points, and prepped and draped in the usual sterile fashion. Next a preoperative timeout was performed.  A site was selected on in the midline for placement of the assistant port. This was placed  using a standard open Hassan technique which allowed entry into the peritoneal cavity.  He did have a large incisional hernia and bowel was noted in the defect.  The bowel was retracted to the side and the rectus fascia was identified. An incision was made in the anterior rectus fascia and the underlying rectus muscle was retracted and the posterior rectus sheath was sharply opened allowing entry into the peritoneal cavity.  A 12 mm port was placed and a pneumoperitoneum established. The camera was then used to inspect the abdomen and there was no evidence of any intra-abdominal injuries or other abnormalities. The remaining abdominal ports were then placed. 8 mm robotic ports were placed in the right upper quadrant, right lower quadrant, and far right lateral abdominal wall. An 8 mm port was placed for the camera site just right of the umbilicus. All ports were placed under direct vision without difficulty. The surgical cart was then docked.   Utilizing the cautery scissors, the white line of Toldt was incised allowing the colon to be mobilized medially and the plane between the mesocolon and the anterior layer of Gerota's fascia to be developed and the kidney to be exposed.  The ureter and gonadal vein were identified inferiorly and the ureter was lifted anteriorly off the psoas muscle.  Dissection proceeded superiorly along the gonadal vein until the renal vein was identified.  The renal hilum was then carefully isolated with a combination of blunt and sharp dissection allowing the renal arterial and venous structures to be separated and isolated in preparation for renal hilar vessel clamping. There was a single renal artery based on imaging and intraoperative assessment.  Attention turned to the kidney and the perinephric fat surrounding the renal mass was removed and the kidney was mobilized sufficiently for exposure and resection of the renal mass.   Intraoperative renal ultrasonography was utilized  with the laparoscopic ultrasound probe to identify the renal tumor and identify the tumor margins.  The mass was not prominent and had been noted to be quite small on imaging.  It was adjacent to a renal cyst and this cyst was easily identified.  Using ultrasound, the surrounding tissue was examined and no clear solid mass was identified on ultrasound or visually.  Lateral to the cyst (not consistent with imaging), there was a very small possible tumor that was barely visual but did look like a small cystic heterogenous 1.0 cm renal mass.  The remainder of the kidney was examined with ultrasound and visually and no other cysts or tumors were seen.  It was therefore decided to remove the section of the kidney adjacent to and including the renal cyst and the other small cystic mass that had been identified.  Once the renal mass area was properly isolated, preparations were made for resection of the tumor.  Reconstructive sutures were placed into the abdomen for the renorrhaphy portion of the procedure.  The renal artery was then clamped with bulldog clamps.  The tumor area was then excised with cold scissor dissection along with an adequate visible gross margin of normal renal parenchyma. The tumor appeared to be excised without any gross violation of the tumor. The renal collecting system was likely entered during removal of the tumor.  There appeared to be significant arterial bleeding during the resection indicating that fully arterial control had not occurred. A running 3-0 V-lock suture was then brought through the capsule of the kidney and run along the base of the renal defect to provide hemostasis and close any entry into the renal collecting system if present. Weck clips were used to secure this suture outside the renal capsule at the proximal and distal ends. An additional hemostatic agent (Surgiflo) was then placed into the renal defect. A running 2-0 V lock suture was then used to close the capsule of  the kidney using a sliding clip technique which resulted in excellent hemostasis.    The bulldog clamps were then removed from the renal hilar vessel(s).. Total warm renal ischemia time was 22 minutes. The renal tumor resection site was examined. Hemostasis appeared adequate.   The kidney was placed back into its normal anatomic position and covered with perinephric fat as needed.  A # 15 Blake drain was then brought through the lateral lower port site and positioned in the perinephric space.  It was secured to the skin with a nylon suture. The surgical cart was undocked.  The renal tumor specimen was removed intact within an endopouch retrieval bag via the upper midline port site. This incision site was covered with a tegaderm for the next portion of the procedure with Dr. Michaell Cowing. All other laparoscopic/robotic ports were removed under direct vision and the pneumoperitoneum let down with inspection of the operative field performed and hemostasis again confirmed. All incision sites were then reapproximated at the skin level with 4-0 monocryl subcuticular closures except for the far lateral port which was covered with tegaderm.   The patient tolerated the procedure well and without complications.  The case was then turned over to Dr. Michaell Cowing for incisional hernia repair which will be dictated separately.  Moody Bruins MD

## 2023-10-31 NOTE — Transfer of Care (Signed)
 Immediate Anesthesia Transfer of Care Note  Patient: Preston [name]  Procedure(s) Performed: XI RIGHT ROBOTIC ASSISTED LAPAROSCOPIC PARTIAL NEPHRECTOMY (Right) XI ROBOTIC ASSISTED INCISIONAL HERNIA REPAIR VS LAPAROSCOPIC HERNIA REPAIR  Patient Location: PACU  Anesthesia Type:General  Level of Consciousness: sedated, patient cooperative, and responds to stimulation  Airway & Oxygen Therapy: Patient Spontanous Breathing and Patient connected to face mask oxygen  Post-op Assessment: Report given to RN and Post -op Vital signs reviewed and stable  Post vital signs: Reviewed and stable  Last Vitals:  Vitals Value Taken Time  BP 104/75 10/31/23 1345  Temp 37.1 C 10/31/23 1345  Pulse 71 10/31/23 1350  Resp 21 10/31/23 1350  SpO2 93 % 10/31/23 1350  Vitals shown include unfiled device data.  Last Pain:  Vitals:   10/31/23 0559  TempSrc: Oral         Complications: No notable events documented.

## 2023-10-31 NOTE — Interval H&P Note (Signed)
 History and Physical Interval Note:  10/31/2023 7:06 AM  Preston [name]  has presented today for surgery, with the diagnosis of RIGHT RENAL MASS, VENTRAL INCISIONAL INCARCERATED HERNIA.  The various methods of treatment have been discussed with the patient and family. After consideration of risks, benefits and other options for treatment, the patient has consented to  Procedure(s) with comments: XI RIGHT ROBOTIC ASSISTED LAPAROSCOPIC PARTIAL NEPHRECTOMY (Right) - 150 MINUTES NEEDED FOR CASE XI ROBOTIC ASSISTED INCISIONAL HERNIA REPAIR VS LAPAROSCOPIC HERNIA REPAIR (N/A) as a surgical intervention.  The patient's history has been reviewed, patient examined, no change in status, stable for surgery.  I have reviewed the patient's chart and labs.  Questions were answered to the patient's satisfaction.     [employer]

## 2023-10-31 NOTE — H&P (Signed)
 10/31/2023     REFERRING PHYSICIAN: Ree Shay*  Patient Care Team: Bertram Denver, NP as PCP - General (Family Medicine) Michaell Cowing, Shawn Route, MD as Consulting Provider (General Surgery) Lou Cal., MD (Urology) Willis Modena, MD (Gastroenterology) Donato Schultz, MD (Cardiovascular Disease)  PROVIDER: Jarrett Soho, MD  [facility] MRN: [id number] DOB: 09-05-1970  SUBJECTIVE   Chief Complaint: Hernia   Preston [name] is a 53 y.o. male  who is seen today as an office consultation  at the request of DrLaverle Patter  for evaluation of hernia.  History of Present Illness:  53 year old obese male. History of left kidney cancer status post partial nephrectomy of right lobe 10//2021. Has a right renal lesion suspicious for renal cell as well. I thinks being monitored. Had bulging upper abdomen and has evidence of incisional hernia in upper abdomen noted on CAT scan in 2023. Had a recent CAT scan that confirmed he still has a hernia. Urology notes are a year ago. Patient comes in today.  Patient still smokes. Seems like he intermittently sees urology and other specialist. He did get stress test prior to his nephrectomy in 2021. Cleared. Followed by Dr. Anne Fu in the past. Recalls being told he had heart failure but last echocardiogram did not look that bad. Did have what sounds like diverticulitis requiring a low anterior resection. Dr. Derrell Lolling with our group in 2014. Pathology consistent with diverticulitis. Followed by Dr. Dulce Sellar with [facility]enterology. Claims he can walk about 10-15 minutes before his stop. Does have diabetes. Some numbness and neuropathy. No heart attacks or strokes that he is aware of. Aside from the partial nephrectomy and colectomy, no other abdominal surgeries. Has to urinate maybe 2 or 3 times at night.  Medical History:  Past Medical History:  Diagnosis Date  Anxiety  Arthritis  COPD (chronic obstructive pulmonary disease)  (CMS/HHS-HCC)  Diabetes mellitus without complication (CMS/HHS-HCC)  GERD (gastroesophageal reflux disease)  History of cancer  Hyperlipidemia  Hypertension   Patient Active Problem List  Diagnosis  Incisional hernia, without obstruction or gangrene  Diastasis recti  Renal cell cancer, left (CMS/HHS-HCC)  Tobacco abuse  Obesity, Class II, BMI 35-39.9  Renal mass, right   Past Surgical History:  Procedure Laterality Date  APPENDECTOMY 07/05/2009  Robotic assited partial nephrectomy 05/02/2020  ARTHROPLASTY HIP TOTAL Bilateral  Colon surgery  14 inches of colon removed  Colonoscopy with propofol  FLEXIBLE SIGMOIDOSCOPY  Laparoscopic low anterior resection  Mandible surgery  Multiple extractions with alveoloplasty    Allergies  Allergen Reactions  Morphine Vomiting, Itching, Nausea and Nausea And Vomiting  Ace Inhibitors Cough  Adhesive Tape-Silicones Itching and Rash  Adhesive Itching and Rash   Current Outpatient Medications on File Prior to Visit  Medication Sig Dispense Refill  allopurinoL (ZYLOPRIM) 300 MG tablet Take 1 tablet by mouth once daily  amLODIPine (NORVASC) 10 MG tablet Take 10 mg by mouth at bedtime  atorvastatin (LIPITOR) 40 MG tablet Take 40 mg by mouth every evening  busPIRone (BUSPAR) 15 MG tablet TAKE 1 TO 2 TABLETS BY MOUTH TWICE DAILY FOR ANXIETY  DULoxetine (CYMBALTA) 60 MG DR capsule Take 60 mg by mouth once daily  gabapentin (NEURONTIN) 300 MG capsule Take 900 mg by mouth 2 (two) times daily  metFORMIN (GLUCOPHAGE) 500 MG tablet Take 1,000 mg by mouth  metoprolol TARTrate (LOPRESSOR) 50 MG tablet Take 1 tablet by mouth 2 (two) times daily  valsartan (DIOVAN) 40 MG tablet Take 40 mg by mouth once  daily   No current facility-administered medications on file prior to visit.   Family History  Problem Relation Age of Onset  Obesity Mother  High blood pressure (Hypertension) Mother  Hyperlipidemia (Elevated cholesterol) Mother  Deep vein  thrombosis (DVT or abnormal blood clot formation) Mother  Diabetes Mother  High blood pressure (Hypertension) Father  Hyperlipidemia (Elevated cholesterol) Father  Diabetes Father    Social History   Tobacco Use  Smoking Status Every Day  Current packs/day: 1.00  Types: Cigarettes  Smokeless Tobacco Never    Social History   Socioeconomic History  Marital status: Single  Tobacco Use  Smoking status: Every Day  Current packs/day: 1.00  Types: Cigarettes  Smokeless tobacco: Never  Substance and Sexual Activity  Alcohol use: Not Currently  Drug use: Never   ############################################################  Review of Systems: A complete review of systems (ROS) was obtained from the patient.  We have reviewed this information and discussed as appropriate with the patient.  See HPI as well for other pertinent ROS.  Constitutional: No fevers, chills, sweats. Weight stable Eyes: No vision changes, No discharge HENT: No sore throats, nasal drainage Lymph: No neck swelling, No bruising easily Pulmonary: No cough, productive sputum CV: No orthopnea, PND . No exertional chest/neck/shoulder/arm pain. Patient can walk 1/2 mile gradually.   GI: No personal nor family history of GI/colon cancer, inflammatory bowel disease, irritable bowel syndrome, allergy such as Celiac Sprue, dietary/dairy problems, colitis, ulcers nor gastritis. No recent sick contacts/gastroenteritis. No travel outside the country. No changes in diet.  Renal: No UTIs, No hematuria Genital: No drainage, bleeding, masses Musculoskeletal: No severe joint pain. Good ROM major joints Skin: No sores or lesions Heme/Lymph: No easy bleeding. No swollen lymph nodes Neuro: No active seizures. No facial droop Psych: No hallucinations. No agitation  OBJECTIVE   Vitals:  07/08/23 1617  BP: (!) 156/95  Pulse: 88  Temp: 37.1 C (98.7 F)  SpO2: 93%  Weight: (!) 132.4 kg (291 lb 12.8 oz)  Height: 190.5  cm (6\' 3" )  PainSc: 3  PainLoc: Knee   Body mass index is 36.47 kg/m.  PHYSICAL EXAM:  Constitutional: Not cachectic. Hygiene fair Vitals signs as above. Obvious smell of tobacco.  Eyes: No glasses. Vision adequate,Pupils reactive, normal extraocular movements. Sclera nonicteric Neuro: CN II-XII intact. No major focal sensory defects. No major motor deficits. Lymph: No head/neck/groin lymphadenopathy Psych: No severe agitation. No severe anxiety. Judgment & insight Adequate, Oriented x4, HENT: Normocephalic, Mucus membranes moist. No thrush. Hearing: adequate Neck: Supple, No tracheal deviation. No obvious thyromegaly Chest: No pain to chest wall compression. Good respiratory excursion. No audible wheezing CV: Pulses intact. regular. No major extremity edema Ext: No obvious deformity or contracture. Edema: Not present. No cyanosis Skin: No major subcutaneous nodules. Warm and dry Musculoskeletal: Severe joint rigidity not present. No obvious clubbing. No digital petechiae. Mobility: no assist device moving easily without restrictions  Abdomen: Obese Soft. Nondistended. Hernia: Supraumbilical bulging 8 x 7 cm region with hernia reducing down with smaller fascial defect.. Diastasis recti: Large supraumbilical midline. No hepatomegaly. No splenomegaly.  Genital/Pelvic: Inguinal hernia: Not present. Inguinal lymph nodes: without lymphadenopathy nor hidradenitis.   Rectal: (Deferred)    ###################################################################  Labs, Imaging and Diagnostic Testing:  Located in 'Care Everywhere' section of Epic EMR chart  PRIOR [facility] CLINIC NOTES:  Located in 'Care Everywhere' section of Epic EMR chart  SURGERY NOTES:  Located in 'Care Everywhere' section of Epic EMR chart  PATHOLOGY:  Located in 'Care  Everywhere' section of Epic EMR chart  Assessment and Plan:  DIAGNOSES:  Diagnoses and all orders for this visit:  Incisional hernia, without  obstruction or gangrene  Diastasis recti  Renal cell cancer, left (CMS/HHS-HCC)  Tobacco abuse  Obesity, Class II, BMI 35-39.9  Renal mass, right    ASSESSMENT/PLAN  Pleasant obese smoking male with upper abdominal incisional hernia for the past few years from prior partial left nephrectomy for renal cell cancer.  I think at some point he would benefit from repair. Will plan a minimally invasive approach IPUM (intraperitoneal underlay mesh) The anatomy & physiology of the abdominal wall was discussed. The pathophysiology of hernias was discussed. Natural history risks without surgery including progeressive enlargement, pain, incarceration, & strangulation was discussed. Contributors to complications such as smoking, obesity, diabetes, prior surgery, etc were discussed.   I feel the risks of no intervention will lead to serious problems that outweigh the operative risks; therefore, I recommended surgery to reduce and repair the hernia. I explained laparoscopic techniques with possible need for an open approach. I noted the probable use of mesh to patch and/or buttress the hernia repair  Risks such as bleeding, infection, abscess, need for further treatment, injury to other organs, need for repair of tissues / organs, stroke, heart attack, death, and other risks were discussed. I noted a good likelihood this will help address the problem. Goals of post-operative recovery were discussed as well. Possibility that this will not correct all symptoms was explained. I stressed the importance of low-impact activity, aggressive pain control, avoiding constipation, & not pushing through pain to minimize risk of post-operative chronic pain or injury. Possibility of reherniation especially with smoking, obesity, diabetes, immunosuppression, and other health conditions was discussed. We will work to minimize complications.   An educational handout further explaining the pathology & treatment options was  given as well. Questions were answered. The patient expresses understanding & wishes to proceed with surgery.  Dr. Laverle Patter is planning to do a right partial nephrectomy robotically and was planning to do a combo case with me. I do not have records of that. We called the office and could not get through to anyone. I will try and send a message to make sure that is the plan so we can coordinate it. From my standpoint he probably can go home and that they are probably the next day. Most likely be able bit longer if it is a combo case.  He still smokes and his exercise tolerance and the greatest pain he got through the nephrectomy 3 years ago relatively well. However, I think it would be wise to double check with Donato Schultz, MD with cardiology that he does not have any extra concerns since I think he is overdue to follow-up with them anyway I recommended obtaining preoperative cardiac clearance. I am concerned about the health of the patient and the ability to tolerate the operation. Therefore, we will request clearance by cardiology to better assess operative risk & see if a reevaluation, further workup, adjustment to medications, etc is needed.  STOP SMOKING! We talked to the patient about the dangers of smoking. We stressed that tobacco use dramatically increases the risk of peri-operative complications such as infection, tissue necrosis leaving to problems with incision/wound and organ healing, hernia, chronic pain, heart attack, stroke, DVT, pulmonary embolism, and death. We noted there are programs in our community to help stop smoking. Information was available.  Ardeth Sportsman, MD, FACS, MASCRS Esophageal, Gastrointestinal & Colorectal Surgery  Robotic and Minimally Invasive Surgery  [facility] [address] [place], [place] [address] [phone] Fax [phone] Main  CONTACT INFORMATION: Weekday (9AM-5PM): Call [facility] main  office at [phone] Weeknight (5PM-9AM) or Weekend/Holiday: Check EPIC "Web Links" tab & use "AMION" (password " [email or web address]") for General Surgery [facility] coverage  Please, DO NOT use SecureChat  (it is not reliable communication to reach operating surgeons & will lead to a delay in care).   Epic staff messaging available for outptient concerns needing 1-2 business day response.      10/31/2023

## 2023-10-31 NOTE — Anesthesia Procedure Notes (Signed)
 Procedure Name: Intubation Date/Time: 10/31/2023 7:45 AM  Performed by: Kizzie Fantasia, CRNAPre-anesthesia Checklist: Patient identified, Emergency Drugs available, Suction available, Patient being monitored and Timeout performed Patient Re-evaluated:Patient Re-evaluated prior to induction Oxygen Delivery Method: Circle system utilized Preoxygenation: Pre-oxygenation with 100% oxygen Induction Type: IV induction Ventilation: Two handed mask ventilation required and Oral airway inserted - appropriate to patient size Laryngoscope Size: Mac and 4 Grade View: Grade I Tube type: Oral Tube size: 7.5 mm Number of attempts: 1 Airway Equipment and Method: Stylet Placement Confirmation: ETT inserted through vocal cords under direct vision, positive ETCO2 and breath sounds checked- equal and bilateral Secured at: 23 cm Tube secured with: Tape Dental Injury: Teeth and Oropharynx as per pre-operative assessment

## 2023-11-01 ENCOUNTER — Encounter (HOSPITAL_COMMUNITY): Payer: Self-pay | Admitting: Urology

## 2023-11-01 DIAGNOSIS — K430 Incisional hernia with obstruction, without gangrene: Secondary | ICD-10-CM | POA: Diagnosis not present

## 2023-11-01 LAB — BASIC METABOLIC PANEL WITH GFR
Anion gap: 11 (ref 5–15)
BUN: 17 mg/dL (ref 6–20)
CO2: 24 mmol/L (ref 22–32)
Calcium: 8 mg/dL — ABNORMAL LOW (ref 8.9–10.3)
Chloride: 92 mmol/L — ABNORMAL LOW (ref 98–111)
Creatinine, Ser: 1.74 mg/dL — ABNORMAL HIGH (ref 0.61–1.24)
GFR, Estimated: 47 mL/min — ABNORMAL LOW (ref >60)
Glucose, Bld: 125 mg/dL — ABNORMAL HIGH (ref 70–99)
Potassium: 5.9 mmol/L — ABNORMAL HIGH (ref 3.5–5.1)
Sodium: 127 mmol/L — ABNORMAL LOW (ref 135–145)

## 2023-11-01 LAB — HEMOGLOBIN AND HEMATOCRIT, BLOOD
HCT: 38.3 % — ABNORMAL LOW (ref 39.0–52.0)
Hemoglobin: 12.6 g/dL — ABNORMAL LOW (ref 13.0–17.0)

## 2023-11-01 LAB — GLUCOSE, CAPILLARY
Glucose-Capillary: 119 mg/dL — ABNORMAL HIGH (ref 70–99)
Glucose-Capillary: 124 mg/dL — ABNORMAL HIGH (ref 70–99)
Glucose-Capillary: 138 mg/dL — ABNORMAL HIGH (ref 70–99)
Glucose-Capillary: 151 mg/dL — ABNORMAL HIGH (ref 70–99)
Glucose-Capillary: 155 mg/dL — ABNORMAL HIGH (ref 70–99)
Glucose-Capillary: 157 mg/dL — ABNORMAL HIGH (ref 70–99)
Glucose-Capillary: 231 mg/dL — ABNORMAL HIGH (ref 70–99)

## 2023-11-01 LAB — CREATININE, FLUID (PLEURAL, PERITONEAL, JP DRAINAGE): Creat, Fluid: 1.7 mg/dL

## 2023-11-01 LAB — HEMOGLOBIN A1C
Hgb A1c MFr Bld: 6.4 % — ABNORMAL HIGH (ref 4.8–5.6)
Mean Plasma Glucose: 137 mg/dL

## 2023-11-01 MED ORDER — ORAL CARE MOUTH RINSE: 15.0000 mL | OROMUCOSAL | Status: DC | PRN

## 2023-11-01 MED ORDER — PHENOL 1.4 % MT LIQD: 2.0000 | OROMUCOSAL | Status: DC | PRN

## 2023-11-01 MED ORDER — GUAIFENESIN-DM 100-10 MG/5ML PO SYRP: 15.0000 mL | ORAL_SOLUTION | ORAL | Status: DC | PRN

## 2023-11-01 MED ORDER — CALCIUM POLYCARBOPHIL 625 MG PO TABS
625.0000 mg | ORAL_TABLET | Freq: Two times a day (BID) | ORAL | Status: DC
  Administered 2023-11-01 – 2023-11-04 (×7): 625 mg via ORAL
  Filled 2023-11-01 (×7): qty 1

## 2023-11-01 MED ORDER — SODIUM CHLORIDE 0.9% FLUSH: 3.0000 mL | INTRAVENOUS | Status: DC | PRN

## 2023-11-01 MED ORDER — NAPHAZOLINE-GLYCERIN 0.012-0.25 % OP SOLN: 1.0000 [drp] | Freq: Four times a day (QID) | OPHTHALMIC | Status: DC | PRN

## 2023-11-01 MED ORDER — SODIUM CHLORIDE 0.9 % IV SOLN: 250.0000 mL | INTRAVENOUS | Status: DC | PRN

## 2023-11-01 MED ORDER — ACETAMINOPHEN 10 MG/ML IV SOLN
1000.0000 mg | Freq: Four times a day (QID) | INTRAVENOUS | Status: DC
  Administered 2023-11-01: 1000 mg via INTRAVENOUS
  Filled 2023-11-01: qty 100

## 2023-11-01 MED ORDER — MENTHOL 3 MG MT LOZG: 1.0000 | LOZENGE | OROMUCOSAL | Status: DC | PRN

## 2023-11-01 MED ORDER — SODIUM CHLORIDE 0.9% FLUSH
3.0000 mL | Freq: Two times a day (BID) | INTRAVENOUS | Status: DC
  Administered 2023-11-01 – 2023-11-14 (×27): 3 mL via INTRAVENOUS

## 2023-11-01 MED ORDER — LACTATED RINGERS IV BOLUS: 1000.0000 mL | Freq: Three times a day (TID) | INTRAVENOUS | Status: AC | PRN

## 2023-11-01 MED ORDER — MAGIC MOUTHWASH: 15.0000 mL | Freq: Four times a day (QID) | ORAL | Status: DC | PRN

## 2023-11-01 MED ORDER — OXYCODONE-ACETAMINOPHEN 5-325 MG PO TABS
1.0000 | ORAL_TABLET | Freq: Four times a day (QID) | ORAL | Status: DC | PRN
Start: 2023-11-01 — End: 2023-11-05
  Administered 2023-11-01 – 2023-11-03 (×8): 2 via ORAL
  Filled 2023-11-01 (×9): qty 2

## 2023-11-01 MED ORDER — SALINE SPRAY 0.65 % NA SOLN: 1.0000 | Freq: Four times a day (QID) | NASAL | Status: DC | PRN

## 2023-11-01 MED ORDER — BISACODYL 10 MG RE SUPP
10.0000 mg | Freq: Once | RECTAL | Status: AC
  Administered 2023-11-01: 10 mg via RECTAL
  Filled 2023-11-01: qty 1

## 2023-11-01 MED ORDER — BISACODYL 10 MG RE SUPP
10.0000 mg | Freq: Every day | RECTAL | Status: DC
  Administered 2023-11-03: 10 mg via RECTAL
  Filled 2023-11-01 (×3): qty 1

## 2023-11-01 NOTE — TOC Initial Note (Signed)
 Transition of Care [facility]) - Initial/Assessment Note    Patient Details  Name: Preston [name] MRN: [id number] Date of Birth: October 28, 1970  Transition of Care [facility]) CM/SW Contact:    Larrie Kass, LCSW Phone Number: 11/01/2023, 4:55 PM  Clinical Narrative:                 CSW met with pt to discuss SDOH concerns for food insecurities , pt stated he did not have nay concerns and did not need any resources for food. Per chart review pt is on 6L of oxygen and not on home O2. Plan is to wean down. TOC to follow for d/c needs.   Expected Discharge Plan: (P) Home/Self Care Barriers to Discharge: (P) Continued Medical Work up   Patient Goals and CMS Choice Patient states their goals for this hospitalization and ongoing recovery are:: (P) retrun home          Expected Discharge Plan and Services                                              Prior Living Arrangements/Services                       Activities of Daily Living   ADL Screening (condition at time of admission) Independently performs ADLs?: Yes (appropriate for developmental age) Is the patient deaf or have difficulty hearing?: No Does the patient have difficulty seeing, even when wearing glasses/contacts?: No Does the patient have difficulty concentrating, remembering, or making decisions?: No  Permission Sought/Granted                  Emotional Assessment              Admission diagnosis:  Neoplasm of right kidney [D49.511] Patient Active Problem List   Diagnosis Date Noted   Neoplasm of right kidney 10/31/2023   Diastasis recti 07/08/2023   Incarcerated incisional hernia s/p repair w mesh 10/31/2023 07/08/2023   History of Left Renal cell cancer 07/08/2023   Cigarette smoker 08/24/2022   Asthmatic bronchitis , chronic (HCC) 08/23/2022   Syncope and collapse 05/15/2021   Tinnitus aurium, left 08/02/2020   Impacted cerumen of left ear 08/02/2020   Conductive hearing loss of  left ear with unrestricted hearing of right ear 08/02/2020   Neoplasm of left kidney 05/02/2020   Diabetic polyneuropathy associated with type 2 diabetes mellitus (HCC) 09/12/2018   Need for influenza vaccination 07/17/2017   Acute pain of right shoulder 04/15/2017   Acute diastolic congestive heart failure (HCC) 01/02/2017   Diabetic ketoacidosis without coma associated with type 2 diabetes mellitus (HCC)    Primary dental caries, multisurface origin    DKA, type 2 (HCC) 12/12/2016   Left ventricular diastolic dysfunction 12/12/2016   Essential hypertension 12/12/2016   Asthma 12/12/2016   Acute kidney injury (HCC) 12/12/2016   Arthritis 12/12/2016   Obesity, Class II, BMI 35-39.9 12/12/2016   Folliculitis of right axilla 12/12/2016   Tooth decayed 12/12/2016   Type 2 diabetes mellitus treated without insulin (HCC) 11/07/2016   Primary osteoarthritis of right hip 09/07/2016   Primary localized osteoarthritis of left hip 06/25/2016   Primary osteoarthritis of left hip 06/24/2016   Tobacco use disorder 10/13/2015   Gastroesophageal reflux disease without esophagitis 07/14/2015   Essential hypertension, benign 03/24/2015   Chronic diastolic congestive  heart failure (HCC) 03/24/2015   Morbid obesity due to excess calories (HCC) 03/24/2015   Nicotine abuse 03/24/2015   Generalized anxiety disorder 03/24/2015   Primary gout 03/24/2015   Chronic pain syndrome 03/24/2015   HLD (hyperlipidemia) 03/11/2015   GERD (gastroesophageal reflux disease) 03/11/2015   Depression 03/11/2015   SOB (shortness of breath) 03/11/2015   Bilateral leg edema 03/11/2015   Alcohol abuse 03/11/2015   Elevated troponin 03/11/2015   Diverticulitis large intestine 07/08/2013   History of colonic polyps 03/10/2013   Diverticulosis 02/03/2013   Tobacco abuse 12/30/2012   Gout    PCP:  Claiborne Rigg, NP Pharmacy:   [id number] ([place]), [place] - [address] [place]) [place] [address] Phone: [phone] Fax: [phone]  [facility] - [place], [place] - [address] [place] [address] Phone: [phone] Fax: [phone]     Social Drivers of Health (SDOH) Social History: SDOH Screenings   Food Insecurity: No Food Insecurity (11/01/2023)  Recent Concern: Food Insecurity - Food Insecurity Present (10/31/2023)  Housing: Low Risk  (10/31/2023)  Transportation Needs: No Transportation Needs (10/31/2023)  Utilities: Not At Risk (10/31/2023)  Alcohol Screen: Low Risk  (07/11/2023)  Depression (PHQ2-9): Low Risk  (10/14/2023)  Financial Resource Strain: High Risk (07/11/2023)  Physical Activity: Unknown (07/11/2023)  Social Connections: Unknown (07/11/2023)  Stress: Stress Concern Present (07/11/2023)  Tobacco Use: High Risk (10/31/2023)   SDOH Interventions: Food Insecurity Interventions: Intervention Not Indicated, Inpatient TOC   Readmission Risk Interventions     No data to display

## 2023-11-01 NOTE — Evaluation (Signed)
 Physical Therapy Evaluation Patient Details Name: Preston [name] MRN: [id number] DOB: 1970/12/25 Today's Date: 11/01/2023  History of Present Illness  53 year old obese male. admitted for incisional hernia, s/p repair 10/31/23. PMH:  left kidney cancer status post partial nephrectomy of right lobe 10//2021. Has a right renal lesion suspicious for renal cell as well. COPD, DM, HTN.  Clinical Impression  Pt admitted with above diagnosis. Pt ambulated 200' with RW, SpO2 92% on 6L O2 walking, SpO2 84% on room air at rest. Verbal cues for pursed lip breathing.  Pt currently with functional limitations due to the deficits listed below (see PT Problem List). Pt will benefit from acute skilled PT to increase their independence and safety with mobility to allow discharge.           If plan is discharge home, recommend the following:     Can travel by private vehicle        Equipment Recommendations Other (comment) (oxygen)  Recommendations for Other Services       Functional Status Assessment Patient has had a recent decline in their functional status and demonstrates the ability to make significant improvements in function in a reasonable and predictable amount of time.     Precautions / Restrictions Precautions Precautions: Other (comment) Recall of Precautions/Restrictions: Intact Precaution/Restrictions Comments: abdominal precautions: Verbal cues for log roll Restrictions Weight Bearing Restrictions Per Provider Order: No      Mobility  Bed Mobility Overal bed mobility: Needs Assistance Bed Mobility: Rolling, Sidelying to Sit Rolling: Supervision Sidelying to sit: Supervision, HOB elevated       General bed mobility comments: VCs for log roll    Transfers Overall transfer level: Needs assistance Equipment used: Rolling walker (2 wheels) Transfers: Sit to/from Stand Sit to Stand: Supervision           General transfer comment: VCs hand placement     Ambulation/Gait Ambulation/Gait assistance: Supervision Gait Distance (Feet): 200 Feet Assistive device: Rolling walker (2 wheels) Gait Pattern/deviations: Step-through pattern, Decreased stride length Gait velocity: WFL     General Gait Details: VCs for pursed lip breathing; SpO2 84% room air at rest, 96% on 5L at rest, 92% on 6L walking  Stairs            Wheelchair Mobility     Tilt Bed    Modified Rankin (Stroke Patients Only)       Balance Overall balance assessment: Modified Independent                                           Pertinent Vitals/Pain Pain Assessment Pain Assessment: 0-10 Pain Score: 7  Pain Location: abdomen Pain Descriptors / Indicators: Sore Pain Intervention(s): Limited activity within patient's tolerance, Monitored during session, Premedicated before session    Home Living Family/patient expects to be discharged to:: Private residence Living Arrangements: Spouse/significant other Available Help at Discharge: Family;Available 24 hours/day Type of Home: Mobile home Home Access: Stairs to enter Entrance Stairs-Rails: Right;Left;Can reach both Entrance Stairs-Number of Steps: 3   Home Layout: One level Home Equipment: Agricultural consultant (2 wheels);Cane - single point      Prior Function Prior Level of Function : Independent/Modified Independent             Mobility Comments: walks with RW or SPC; 1 falls in past 6 months (slipped on wet deck) ADLs Comments: independent  Extremity/Trunk Assessment   Upper Extremity Assessment Upper Extremity Assessment: Overall WFL for tasks assessed    Lower Extremity Assessment Lower Extremity Assessment: LLE deficits/detail;RLE deficits/detail RLE Deficits / Details: B feet numb to light touch, pt reports this is baseline RLE Sensation: history of peripheral neuropathy;decreased light touch LLE Deficits / Details: B feet numb to light touch, pt reports this is  baseline LLE Sensation: history of peripheral neuropathy;decreased light touch       Communication   Communication Communication: No apparent difficulties    Cognition Arousal: Alert Behavior During Therapy: WFL for tasks assessed/performed   PT - Cognitive impairments: No apparent impairments                         Following commands: Intact       Cueing       General Comments      Exercises     Assessment/Plan    PT Assessment Patient needs continued PT services  PT Problem List Cardiopulmonary status limiting activity;Decreased activity tolerance       PT Treatment Interventions      PT Goals (Current goals can be found in the Care Plan section)  Acute Rehab PT Goals Patient Stated Goal: likes to hunt and fish PT Goal Formulation: With patient Time For Goal Achievement: 11/15/23 Potential to Achieve Goals: Good    Frequency Min 3X/week     Co-evaluation               AM-PAC PT "6 Clicks" Mobility  Outcome Measure Help needed turning from your back to your side while in a flat bed without using bedrails?: None Help needed moving from lying on your back to sitting on the side of a flat bed without using bedrails?: None Help needed moving to and from a bed to a chair (including a wheelchair)?: None Help needed standing up from a chair using your arms (e.g., wheelchair or bedside chair)?: None Help needed to walk in hospital room?: None Help needed climbing 3-5 steps with a railing? : None 6 Click Score: 24    End of Session Equipment Utilized During Treatment: Gait belt;Oxygen Activity Tolerance: Patient tolerated treatment well Patient left: in chair;with call bell/phone within reach;with nursing/sitter in room Nurse Communication: Mobility status PT Visit Diagnosis: Difficulty in walking, not elsewhere classified (R26.2)    Time: 6213-0865 PT Time Calculation (min) (ACUTE ONLY): 27 min   Charges:   PT Evaluation $PT Eval  Moderate Complexity: 1 Mod PT Treatments $Gait Training: 8-22 mins PT General Charges $$ ACUTE PT VISIT: 1 Visit         Tamala Ser PT 11/01/2023  Acute Rehabilitation Services  Office [phone]

## 2023-11-01 NOTE — Progress Notes (Signed)
 SATURATION QUALIFICATIONS: (This note is used to comply with regulatory documentation for home oxygen)  Patient Saturations on Room Air at Rest = 84%    Patient Saturations on 6 Liters of oxygen while Ambulating = 92%  Please briefly explain why patient needs home oxygen: to maintain appropriate SpO2 levels.   Ralene Bathe Kistler PT 11/01/2023  Acute Rehabilitation Services  Office [phone]

## 2023-11-01 NOTE — Progress Notes (Signed)
 Patient ID: Preston [name], male   DOB: [birth year], 53 y.o.   MRN: [id number]  1 Day Post-Op Subjective: The patient is doing well.  No nausea or vomiting. Pain is adequately controlled.  Objective: Vital signs in last 24 hours: Temp:  [97.6 F (36.4 C)-98.8 F (37.1 C)] 97.9 F (36.6 C) (04/04 0407) Pulse Rate:  [63-74] 74 (04/04 0407) Resp:  [13-23] 18 (04/04 0407) BP: (101-141)/(61-88) 124/66 (04/04 0407) SpO2:  [87 %-98 %] 93 % (04/04 0407)  Intake/Output from previous day: 04/03 0701 - 04/04 0700 In: 4733.2 [P.O.:240; I.V.:4136.5; IV Piggyback:356.7] Out: 1365 [Urine:700; Drains:215; Blood:450] Intake/Output this shift: No intake/output data recorded.  Physical Exam:  General: Alert and oriented. CV: RRR Lungs: Clear bilaterally. GI: Soft, Nondistended. Incisions: Clean and dry. Urine: Clear Extremities: Nontender, no erythema, no edema.  Lab Results: Recent Labs    10/29/23 1142 10/31/23 1400 11/01/23 0355  HGB 16.5 14.8 12.6*  HCT 49.1 45.2 38.3*          Recent Labs    10/31/23 0644 10/31/23 1400 11/01/23 0355  CREATININE 0.43* 1.56* 1.74*           Results for orders placed or performed during the hospital encounter of 10/31/23 (from the past 24 hours)  Glucose, capillary     Status: Abnormal   Collection Time: 10/31/23 11:26 AM  Result Value Ref Range   Glucose-Capillary 228 (H) 70 - 99 mg/dL  Glucose, capillary     Status: Abnormal   Collection Time: 10/31/23  1:47 PM  Result Value Ref Range   Glucose-Capillary 225 (H) 70 - 99 mg/dL  Basic metabolic panel     Status: Abnormal   Collection Time: 10/31/23  2:00 PM  Result Value Ref Range   Sodium 126 (L) 135 - 145 mmol/L   Potassium 6.1 (H) 3.5 - 5.1 mmol/L   Chloride 90 (L) 98 - 111 mmol/L   CO2 23 22 - 32 mmol/L   Glucose, Bld 229 (H) 70 - 99 mg/dL   BUN 8 6 - 20 mg/dL   Creatinine, Ser 9.62 (H) 0.61 - 1.24 mg/dL   Calcium 8.4 (L) 8.9 - 10.3 mg/dL   GFR, Estimated 53 (L) >60 mL/min    Anion gap 13 5 - 15  Hemoglobin and hematocrit, blood     Status: None   Collection Time: 10/31/23  2:00 PM  Result Value Ref Range   Hemoglobin 14.8 13.0 - 17.0 g/dL   HCT 95.2 84.1 - 32.4 %  Glucose, capillary     Status: Abnormal   Collection Time: 10/31/23  2:46 PM  Result Value Ref Range   Glucose-Capillary 241 (H) 70 - 99 mg/dL  Glucose, capillary     Status: Abnormal   Collection Time: 10/31/23  4:25 PM  Result Value Ref Range   Glucose-Capillary 200 (H) 70 - 99 mg/dL  Hemoglobin M0N     Status: Abnormal   Collection Time: 10/31/23  4:56 PM  Result Value Ref Range   Hgb A1c MFr Bld 6.4 (H) 4.8 - 5.6 %   Mean Plasma Glucose 137 mg/dL  Glucose, capillary     Status: Abnormal   Collection Time: 10/31/23  7:57 PM  Result Value Ref Range   Glucose-Capillary 138 (H) 70 - 99 mg/dL  Glucose, capillary     Status: Abnormal   Collection Time: 11/01/23 12:00 AM  Result Value Ref Range   Glucose-Capillary 119 (H) 70 - 99 mg/dL  Basic metabolic panel  Status: Abnormal   Collection Time: 11/01/23  3:55 AM  Result Value Ref Range   Sodium 127 (L) 135 - 145 mmol/L   Potassium 5.9 (H) 3.5 - 5.1 mmol/L   Chloride 92 (L) 98 - 111 mmol/L   CO2 24 22 - 32 mmol/L   Glucose, Bld 125 (H) 70 - 99 mg/dL   BUN 17 6 - 20 mg/dL   Creatinine, Ser 1.61 (H) 0.61 - 1.24 mg/dL   Calcium 8.0 (L) 8.9 - 10.3 mg/dL   GFR, Estimated 47 (L) >60 mL/min   Anion gap 11 5 - 15  Hemoglobin and hematocrit, blood     Status: Abnormal   Collection Time: 11/01/23  3:55 AM  Result Value Ref Range   Hemoglobin 12.6 (L) 13.0 - 17.0 g/dL   HCT 09.6 (L) 04.5 - 40.9 %  Glucose, capillary     Status: Abnormal   Collection Time: 11/01/23  4:00 AM  Result Value Ref Range   Glucose-Capillary 124 (H) 70 - 99 mg/dL  Glucose, capillary     Status: Abnormal   Collection Time: 11/01/23  7:39 AM  Result Value Ref Range   Glucose-Capillary 138 (H) 70 - 99 mg/dL    Assessment/Plan: POD# 1 s/p robotic partial  nephrectomy.  1) Ambulate, Incentive spirometry 2) Advance diet as tolerated 3) Transition to oral pain medication 4) Dulcolax suppository 5) D/C urethral catheter   Moody Bruins. MD   LOS: 0 days   Crecencio Mc 11/01/2023, 7:55 AM

## 2023-11-01 NOTE — Plan of Care (Signed)
  Problem: Education: Goal: Knowledge of General Education information will improve Description: Including pain rating scale, medication(s)/side effects and non-pharmacologic comfort measures Outcome: Progressing   Problem: Health Behavior/Discharge Planning: Goal: Ability to manage health-related needs will improve Outcome: Progressing   Problem: Clinical Measurements: Goal: Ability to maintain clinical measurements within normal limits will improve Outcome: Progressing Goal: Will remain free from infection Outcome: Progressing Goal: Diagnostic test results will improve Outcome: Progressing Goal: Respiratory complications will improve Outcome: Progressing Goal: Cardiovascular complication will be avoided Outcome: Progressing   Problem: Activity: Goal: Risk for activity intolerance will decrease Outcome: Progressing   Problem: Nutrition: Goal: Adequate nutrition will be maintained Outcome: Progressing   Problem: Coping: Goal: Level of anxiety will decrease Outcome: Progressing   Problem: Elimination: Goal: Will not experience complications related to bowel motility Outcome: Progressing Goal: Will not experience complications related to urinary retention Outcome: Progressing   Problem: Pain Managment: Goal: General experience of comfort will improve and/or be controlled Outcome: Progressing   Problem: Safety: Goal: Ability to remain free from injury will improve Outcome: Progressing   Problem: Skin Integrity: Goal: Risk for impaired skin integrity will decrease Outcome: Progressing   Problem: Education: Goal: Ability to describe self-care measures that may prevent or decrease complications (Diabetes Survival Skills Education) will improve Outcome: Progressing Goal: Individualized Educational Video(s) Outcome: Progressing   Problem: Coping: Goal: Ability to adjust to condition or change in health will improve Outcome: Progressing   Problem: Fluid  Volume: Goal: Ability to maintain a balanced intake and output will improve Outcome: Progressing   Problem: Health Behavior/Discharge Planning: Goal: Ability to identify and utilize available resources and services will improve Outcome: Progressing Goal: Ability to manage health-related needs will improve Outcome: Progressing   Problem: Metabolic: Goal: Ability to maintain appropriate glucose levels will improve Outcome: Progressing   Problem: Nutritional: Goal: Maintenance of adequate nutrition will improve Outcome: Progressing Goal: Progress toward achieving an optimal weight will improve Outcome: Progressing   Problem: Skin Integrity: Goal: Risk for impaired skin integrity will decrease Outcome: Progressing   Problem: Tissue Perfusion: Goal: Adequacy of tissue perfusion will improve Outcome: Progressing   Problem: Education: Goal: Knowledge of the prescribed therapeutic regimen will improve Outcome: Progressing   Problem: Bowel/Gastric: Goal: Gastrointestinal status for postoperative course will improve Outcome: Progressing   Problem: Clinical Measurements: Goal: Postoperative complications will be avoided or minimized Outcome: Progressing   Problem: Respiratory: Goal: Ability to achieve and maintain a regular respiratory rate will improve Outcome: Progressing   Problem: Skin Integrity: Goal: Demonstration of wound healing without infection will improve Outcome: Progressing   Problem: Urinary Elimination: Goal: Ability to avoid or minimize complications of infection will improve Outcome: Progressing Goal: Ability to achieve and maintain urine output will improve Outcome: Progressing  Pt  incision sites are leaking. I have changed the dressing.

## 2023-11-01 NOTE — Progress Notes (Signed)
 11/01/2023  Preston [name] [id number] 1971/04/16  CARE TEAM: PCP: Claiborne Rigg, NP  Outpatient Care Team: Patient Care Team: Claiborne Rigg, NP as PCP - General (Nurse Practitioner) Jake Bathe, MD as PCP - Cardiology (Cardiology) Willis Modena, MD as Consulting Physician (Gastroenterology) Karie Soda, MD as Consulting Physician (General Surgery) Heloise Purpura, MD as Consulting Physician (Urology)  Inpatient Treatment Team: Treatment Team:  Heloise Purpura, MD Karie Soda, MD Harrie Foreman, PA-C Myrla Halsted, RN Chucky May, NT   Problem List:   Principal Problem:   Neoplasm of right kidney Active Problems:   Tobacco abuse   GERD (gastroesophageal reflux disease)   Alcohol abuse   Chronic diastolic congestive heart failure (HCC)   Diastasis recti   Incarcerated incisional hernia s/p repair w mesh 10/31/2023   History of Left Renal cell cancer   10/31/2023    POST-OPERATIVE DIAGNOSIS:  VENTRAL INCISIONAL INCARCERATED HERNIA  Dimensions of hernia post-op:  15cm x 7cm   PROCEDURE:   ROBOTIC & LAPAROSCOPIC REPAIR OF ABDOMINAL HERNIA WITH MESH  (See OR Findings below) TAP BLOCK - BILATERAL   SURGEON:  Ardeth Sportsman, MD  OR FINDINGS: Large supraumbilical incisional hernia incarcerated with a foot of transverse colon and greater omentum.  Swiss cheese hernias going down infraumbilically connecting with his prior low midline incision as well.  Umbilical hernia.   Type of ventral wall repair:  Laparoscopic underlay repair .with Primary repair of largest hernia Placement of mesh: Centrally intraperitoneal with edges tucked into RECTRORECTUS & preperitoneal space Name of mesh: Bard Ventralight dual sided (polypropylene / Seprafilm) Size of mesh: 33x27cm Orientation: Vertical Mesh overlap:  5-7cm  10/31/2023   Postoperative diagnosis: Right renal neoplasm   Procedure:   Right robotic-assisted laparoscopic partial  nephrectomy Intraoperative renal ultrasonography   Surgeon: Moody Bruins. M.D.   Intraoperative findings:       1. Warm renal ischemia time: 22 minutes       2. Intraoperative renal ultrasound findings: There was a small cystic appearing mass that measured about 1 cm adjacent to a larger renal cyst.   Drains: # 15 Blake perinephric drain  Assessment [facility] Stay = 0 days) 1 Day Post-Op    Okay    Plan:  -ERAS protocol  Tolerating clears.  Can advance diet.  Aggressive fiber regimen to avoid constipation.  Multimodal pain control.  Challenging this obese male with chronic pain issues.  Back on his Cymbalta, BuSpar, and gabapentin.  Continue scheduled Tylenol.  Oral narcotics and muscle relaxants.  IV for backup.  He likes his Dilaudid.  Given everything's been through, it is not surprising he needs some stronger pain meds at first but hopefully can wean off.  I would keep him on the dry side from a pulmonary standpoint and he is not oliguric.  He does have a bump in his creatinine.  Will defer to urology team.  Albuterol as needed.  Hopefully can keep on the dry side without stressing his kidneys too much  Foley catheter per urology team.  Drain per urology team.  Some mild leaking around the drain site.  Hopefully will calm down with reinforcement.  Drain functional.  -monitor electrolytes & replace as needed  Keep K>4, Mg>2, Phos>3  -VTE prophylaxis- SCDs.  Anticoagulation prophyllaxis SQ as appropriate  -mobilize as tolerated to help recovery.  Enlist therapies in moderate/high risk patients as appropriate.  Nursing understandably guarded and trying to mobilize him given his morbid obesity with  back and deconditioned and neuropathy issues.  Will see if physical therapy can be involved once urology feels it is safe to for the patient to mobilize.  I updated the patient's status to the patient and nurse  Recommendations were made.  Questions were answered.  They  expressed understanding & appreciation.  -Disposition:  Disposition:  The patient is from: Home Anticipate discharge to:  Home Anticipated Date of Discharge is:  April 5,2025   Barriers to discharge:  Consultant clearance & sign off  , Therapy assessment & Recommendations pending, and Pending Clinical improvement (more likely than not)  Patient currently is NOT MEDICALLY STABLE for discharge from the hospital from a surgery standpoint.      I reviewed nursing notes, last 24 h vitals and pain scores, last 48 h intake and output, last 24 h labs and trends, and last 24 h imaging results.  I have reviewed this patient's available data, including medical history, events of note, test results, etc as part of my evaluation.   A significant portion of that time was spent in counseling. Care during the described time interval was provided by me.  This care required moderate level of medical decision making.  11/01/2023    Subjective: (Chief complaint)  Patient with bedrest for now.  Drainage around right lateral drain site.  Binder soaked, so removed.  A few incisions were reinforced.  Nursing trying to stand, pain.  Required IV meds.  Objective:  Vital signs:  Vitals:   10/31/23 1957 11/01/23 0007 11/01/23 0110 11/01/23 0407  BP: 126/84 130/88  124/66  Pulse: 74 70  74  Resp: 20 18  18   Temp: 97.6 F (36.4 C) 98.5 F (36.9 C)  97.9 F (36.6 C)  TempSrc: Oral Oral  Oral  SpO2: 98% (!) 87% 93% 93%  Weight:      Height:        Last BM Date : 10/30/23  Intake/Output   Yesterday:  04/03 0701 - 04/04 0700 In: 4733.2 [P.O.:240; I.V.:4136.5; IV Piggyback:356.7] Out: 1335 [Urine:700; Drains:185; Blood:450] This shift:  Total I/O In: 1424.7 [P.O.:240; I.V.:928; IV Piggyback:256.7] Out: 360 [Urine:300; Drains:60]  Bowel function:  Flatus: YES  BM:  No  Drain: Serosanguinous   Physical Exam:  General: Pt awake/alert in no acute distress Eyes: PERRL, normal EOM.   Sclera clear.  No icterus Neuro: CN II-XII intact w/o focal sensory/motor deficits. Lymph: No head/neck/groin lymphadenopathy Psych:  No delerium/psychosis/paranoia.  Oriented x 4 HENT: Normocephalic, Mucus membranes moist.  No thrush Neck: Supple, No tracheal deviation.  No obvious thyromegaly Chest: No pain to chest wall compression.  Good respiratory excursion.  No audible wheezing but using abdominal wall to breathe.  Minimal conversational dyspnea. CV:  Pulses intact.  Regular rhythm.  No major extremity edema MS: Normal AROM mjr joints.  No obvious deformity  Abdomen: Soft.  Mildy distended.  Mildly tender at incisions only.  Dressings clean dry and intact (had been replaced/reinforced around drain x 1 by the nurse)  No evidence of peritonitis.  No incarcerated hernias.  Ext:   No deformity.  No mjr edema.  No cyanosis Skin: No petechiae / purpurea.  No major sores.  Warm and dry    Results:   Cultures: No results found for this or any previous visit (from the past 720 hours).  Labs: Results for orders placed or performed during the hospital encounter of 10/31/23 (from the past 48 hours)  Glucose, capillary     Status: Abnormal  Collection Time: 10/31/23  5:54 AM  Result Value Ref Range   Glucose-Capillary 104 (H) 70 - 99 mg/dL    Comment: Glucose reference range applies only to samples taken after fasting for at least 8 hours.  Basic metabolic panel     Status: Abnormal   Collection Time: 10/31/23  6:44 AM  Result Value Ref Range   Sodium 125 (L) 135 - 145 mmol/L   Potassium 3.5 3.5 - 5.1 mmol/L   Chloride 89 (L) 98 - 111 mmol/L   CO2 21 (L) 22 - 32 mmol/L   Glucose, Bld 120 (H) 70 - 99 mg/dL    Comment: Glucose reference range applies only to samples taken after fasting for at least 8 hours.   BUN <5 (L) 6 - 20 mg/dL   Creatinine, Ser 4.09 (L) 0.61 - 1.24 mg/dL   Calcium 9.1 8.9 - 81.1 mg/dL   GFR, Estimated >91 >47 mL/min    Comment: (NOTE) Calculated using the  CKD-EPI Creatinine Equation (2021)    Anion gap 15 5 - 15    Comment: Performed at [facility], [address], [place], [place] [address]  Glucose, capillary     Status: Abnormal   Collection Time: 10/31/23 11:26 AM  Result Value Ref Range   Glucose-Capillary 228 (H) 70 - 99 mg/dL    Comment: Glucose reference range applies only to samples taken after fasting for at least 8 hours.  Glucose, capillary     Status: Abnormal   Collection Time: 10/31/23  1:47 PM  Result Value Ref Range   Glucose-Capillary 225 (H) 70 - 99 mg/dL    Comment: Glucose reference range applies only to samples taken after fasting for at least 8 hours.  Basic metabolic panel     Status: Abnormal   Collection Time: 10/31/23  2:00 PM  Result Value Ref Range   Sodium 126 (L) 135 - 145 mmol/L   Potassium 6.1 (H) 3.5 - 5.1 mmol/L   Chloride 90 (L) 98 - 111 mmol/L   CO2 23 22 - 32 mmol/L   Glucose, Bld 229 (H) 70 - 99 mg/dL    Comment: Glucose reference range applies only to samples taken after fasting for at least 8 hours.   BUN 8 6 - 20 mg/dL   Creatinine, Ser 2.13 (H) 0.61 - 1.24 mg/dL   Calcium 8.4 (L) 8.9 - 10.3 mg/dL    Comment: DELTA CHECK NOTED   GFR, Estimated 53 (L) >60 mL/min    Comment: (NOTE) Calculated using the CKD-EPI Creatinine Equation (2021)    Anion gap 13 5 - 15    Comment: Performed at [facility], [address] [place], [place] [address]  Hemoglobin and hematocrit, blood     Status: None   Collection Time: 10/31/23  2:00 PM  Result Value Ref Range   Hemoglobin 14.8 13.0 - 17.0 g/dL   HCT 84.6 96.2 - 95.2 %    Comment: Performed at [facility], [address] [place], [place] [address]  Glucose, capillary     Status: Abnormal   Collection Time: 10/31/23  2:46 PM  Result Value Ref Range   Glucose-Capillary 241 (H) 70 - 99 mg/dL    Comment: Glucose reference range applies only to samples taken after fasting for at least 8  hours.  Glucose, capillary     Status: Abnormal   Collection Time: 10/31/23  4:25 PM  Result Value Ref Range   Glucose-Capillary 200 (H)  70 - 99 mg/dL    Comment: Glucose reference range applies only to samples taken after fasting for at least 8 hours.  Hemoglobin A1c     Status: Abnormal   Collection Time: 10/31/23  4:56 PM  Result Value Ref Range   Hgb A1c MFr Bld 6.4 (H) 4.8 - 5.6 %    Comment: (NOTE)         Prediabetes: 5.7 - 6.4         Diabetes: >6.4         Glycemic control for adults with diabetes: <7.0    Mean Plasma Glucose 137 mg/dL    Comment: (NOTE) Performed At: [facility] [address] [place], [place] [id number] Jolene Schimke MD YQ:[id number]   Glucose, capillary     Status: Abnormal   Collection Time: 10/31/23  7:57 PM  Result Value Ref Range   Glucose-Capillary 138 (H) 70 - 99 mg/dL    Comment: Glucose reference range applies only to samples taken after fasting for at least 8 hours.  Glucose, capillary     Status: Abnormal   Collection Time: 11/01/23 12:00 AM  Result Value Ref Range   Glucose-Capillary 119 (H) 70 - 99 mg/dL    Comment: Glucose reference range applies only to samples taken after fasting for at least 8 hours.  Basic metabolic panel     Status: Abnormal   Collection Time: 11/01/23  3:55 AM  Result Value Ref Range   Sodium 127 (L) 135 - 145 mmol/L   Potassium 5.9 (H) 3.5 - 5.1 mmol/L   Chloride 92 (L) 98 - 111 mmol/L   CO2 24 22 - 32 mmol/L   Glucose, Bld 125 (H) 70 - 99 mg/dL    Comment: Glucose reference range applies only to samples taken after fasting for at least 8 hours.   BUN 17 6 - 20 mg/dL   Creatinine, Ser 5.28 (H) 0.61 - 1.24 mg/dL   Calcium 8.0 (L) 8.9 - 10.3 mg/dL   GFR, Estimated 47 (L) >60 mL/min    Comment: (NOTE) Calculated using the CKD-EPI Creatinine Equation (2021)    Anion gap 11 5 - 15    Comment: Performed at [facility], [address] [place], [place] [address]  Hemoglobin and  hematocrit, blood     Status: Abnormal   Collection Time: 11/01/23  3:55 AM  Result Value Ref Range   Hemoglobin 12.6 (L) 13.0 - 17.0 g/dL   HCT 40.1 (L) 02.7 - 25.3 %    Comment: Performed at [facility], [address], [place], [place] [address]  Glucose, capillary     Status: Abnormal   Collection Time: 11/01/23  4:00 AM  Result Value Ref Range   Glucose-Capillary 124 (H) 70 - 99 mg/dL    Comment: Glucose reference range applies only to samples taken after fasting for at least 8 hours.    Imaging / Studies: No results found.  Medications / Allergies: per chart  Antibiotics: Anti-infectives (From admission, onward)    Start     Dose/Rate Route Frequency Ordered Stop   10/31/23 2000  ceFAZolin (ANCEF) IVPB 1 g/50 mL premix        1 g 100 mL/hr over 30 Minutes Intravenous Every 8 hours 10/31/23 1606 11/01/23 0449   10/31/23 0600  ceFAZolin (ANCEF) IVPB 2g/100 mL premix  Status:  Discontinued        2 g 200 mL/hr over 30 Minutes Intravenous On call to O.R. 10/31/23  4098 10/31/23 0538   10/31/23 0536  ceFAZolin (ANCEF) IVPB 2g/100 mL premix        2 g 200 mL/hr over 30 Minutes Intravenous 30 min pre-op 10/31/23 0536 10/31/23 1127         Note: Portions of this report may have been transcribed using voice recognition software. Every effort was made to ensure accuracy; however, inadvertent computerized transcription errors may be present.   Any transcriptional errors that result from this process are unintentional.    Ardeth Sportsman, MD, FACS, MASCRS Esophageal, Gastrointestinal & Colorectal Surgery Robotic and Minimally Invasive Surgery  [facility] [address] [place], [place] [address] [phone] Fax [phone] Main  CONTACT INFORMATION: Weekday (9AM-5PM): Call [facility] main office at [phone] Weeknight (5PM-9AM) or Weekend/Holiday: Check EPIC "Web Links" tab & use  "AMION" (password " [email or web address]") for General Surgery [facility] coverage  Please, DO NOT use SecureChat  (it is not reliable communication to reach operating surgeons & will lead to a delay in care).   Epic staff messaging available for outptient concerns needing 1-2 business day response.      11/01/2023  6:38 AM

## 2023-11-01 NOTE — Progress Notes (Signed)
 Patient ID: Preston [name], male   DOB: [birth year], 53 y.o.   MRN: [id number]  Preston [name] has been doing well with pain control and ambulating.  Voided once.  Tolerating diet.  He was 84% on RA and required oxygen to ambulate with PT.   Will continue to ambulate/pulmonary toilet and wean oxygen down.  He was not on oxygen preoperatively and would not see why he would need to go home on oxygen.  This is likely related to his inability to take deep breaths s/p incisional hernia repair.

## 2023-11-01 NOTE — Plan of Care (Signed)
  Problem: Education: Goal: Knowledge of General Education information will improve Description: Including pain rating scale, medication(s)/side effects and non-pharmacologic comfort measures Outcome: Progressing   Problem: Health Behavior/Discharge Planning: Goal: Ability to manage health-related needs will improve Outcome: Progressing   Problem: Nutrition: Goal: Adequate nutrition will be maintained Outcome: Progressing   Problem: Activity: Goal: Risk for activity intolerance will decrease Outcome: Progressing   Problem: Elimination: Goal: Will not experience complications related to bowel motility Outcome: Progressing Goal: Will not experience complications related to urinary retention Outcome: Progressing

## 2023-11-02 ENCOUNTER — Observation Stay (HOSPITAL_COMMUNITY)

## 2023-11-02 DIAGNOSIS — E1165 Type 2 diabetes mellitus with hyperglycemia: Secondary | ICD-10-CM | POA: Diagnosis not present

## 2023-11-02 DIAGNOSIS — N179 Acute kidney failure, unspecified: Secondary | ICD-10-CM | POA: Diagnosis not present

## 2023-11-02 DIAGNOSIS — R509 Fever, unspecified: Secondary | ICD-10-CM | POA: Diagnosis not present

## 2023-11-02 DIAGNOSIS — I517 Cardiomegaly: Secondary | ICD-10-CM | POA: Diagnosis not present

## 2023-11-02 DIAGNOSIS — J9601 Acute respiratory failure with hypoxia: Secondary | ICD-10-CM | POA: Diagnosis present

## 2023-11-02 DIAGNOSIS — Z452 Encounter for adjustment and management of vascular access device: Secondary | ICD-10-CM | POA: Diagnosis not present

## 2023-11-02 DIAGNOSIS — R161 Splenomegaly, not elsewhere classified: Secondary | ICD-10-CM | POA: Diagnosis not present

## 2023-11-02 DIAGNOSIS — I10 Essential (primary) hypertension: Secondary | ICD-10-CM | POA: Diagnosis not present

## 2023-11-02 DIAGNOSIS — I700 Atherosclerosis of aorta: Secondary | ICD-10-CM | POA: Diagnosis not present

## 2023-11-02 DIAGNOSIS — E7800 Pure hypercholesterolemia, unspecified: Secondary | ICD-10-CM | POA: Diagnosis present

## 2023-11-02 DIAGNOSIS — J45901 Unspecified asthma with (acute) exacerbation: Secondary | ICD-10-CM | POA: Diagnosis present

## 2023-11-02 DIAGNOSIS — E871 Hypo-osmolality and hyponatremia: Secondary | ICD-10-CM | POA: Diagnosis present

## 2023-11-02 DIAGNOSIS — J189 Pneumonia, unspecified organism: Secondary | ICD-10-CM | POA: Diagnosis not present

## 2023-11-02 DIAGNOSIS — R0602 Shortness of breath: Secondary | ICD-10-CM

## 2023-11-02 DIAGNOSIS — R519 Headache, unspecified: Secondary | ICD-10-CM | POA: Diagnosis not present

## 2023-11-02 DIAGNOSIS — F05 Delirium due to known physiological condition: Secondary | ICD-10-CM | POA: Diagnosis not present

## 2023-11-02 DIAGNOSIS — K5900 Constipation, unspecified: Secondary | ICD-10-CM | POA: Diagnosis not present

## 2023-11-02 DIAGNOSIS — Z86711 Personal history of pulmonary embolism: Secondary | ICD-10-CM | POA: Diagnosis not present

## 2023-11-02 DIAGNOSIS — G9340 Encephalopathy, unspecified: Secondary | ICD-10-CM | POA: Diagnosis not present

## 2023-11-02 DIAGNOSIS — I110 Hypertensive heart disease with heart failure: Secondary | ICD-10-CM | POA: Diagnosis present

## 2023-11-02 DIAGNOSIS — F10931 Alcohol use, unspecified with withdrawal delirium: Secondary | ICD-10-CM | POA: Diagnosis not present

## 2023-11-02 DIAGNOSIS — J9811 Atelectasis: Secondary | ICD-10-CM | POA: Diagnosis not present

## 2023-11-02 DIAGNOSIS — J9690 Respiratory failure, unspecified, unspecified whether with hypoxia or hypercapnia: Secondary | ICD-10-CM | POA: Diagnosis not present

## 2023-11-02 DIAGNOSIS — F10239 Alcohol dependence with withdrawal, unspecified: Secondary | ICD-10-CM | POA: Diagnosis not present

## 2023-11-02 DIAGNOSIS — F10231 Alcohol dependence with withdrawal delirium: Secondary | ICD-10-CM | POA: Diagnosis present

## 2023-11-02 DIAGNOSIS — J9620 Acute and chronic respiratory failure, unspecified whether with hypoxia or hypercapnia: Secondary | ICD-10-CM | POA: Diagnosis not present

## 2023-11-02 DIAGNOSIS — K430 Incisional hernia with obstruction, without gangrene: Secondary | ICD-10-CM | POA: Diagnosis present

## 2023-11-02 DIAGNOSIS — Z66 Do not resuscitate: Secondary | ICD-10-CM | POA: Diagnosis not present

## 2023-11-02 DIAGNOSIS — R0989 Other specified symptoms and signs involving the circulatory and respiratory systems: Secondary | ICD-10-CM | POA: Diagnosis not present

## 2023-11-02 DIAGNOSIS — Z720 Tobacco use: Secondary | ICD-10-CM | POA: Diagnosis not present

## 2023-11-02 DIAGNOSIS — D3001 Benign neoplasm of right kidney: Secondary | ICD-10-CM | POA: Diagnosis present

## 2023-11-02 DIAGNOSIS — K567 Ileus, unspecified: Secondary | ICD-10-CM | POA: Diagnosis not present

## 2023-11-02 DIAGNOSIS — E1140 Type 2 diabetes mellitus with diabetic neuropathy, unspecified: Secondary | ICD-10-CM | POA: Diagnosis present

## 2023-11-02 DIAGNOSIS — D49511 Neoplasm of unspecified behavior of right kidney: Secondary | ICD-10-CM | POA: Diagnosis not present

## 2023-11-02 DIAGNOSIS — Z794 Long term (current) use of insulin: Secondary | ICD-10-CM | POA: Diagnosis not present

## 2023-11-02 DIAGNOSIS — Z515 Encounter for palliative care: Secondary | ICD-10-CM | POA: Diagnosis not present

## 2023-11-02 DIAGNOSIS — R9082 White matter disease, unspecified: Secondary | ICD-10-CM | POA: Diagnosis not present

## 2023-11-02 DIAGNOSIS — Z9889 Other specified postprocedural states: Secondary | ICD-10-CM | POA: Diagnosis not present

## 2023-11-02 DIAGNOSIS — J690 Pneumonitis due to inhalation of food and vomit: Secondary | ICD-10-CM | POA: Diagnosis not present

## 2023-11-02 DIAGNOSIS — J9621 Acute and chronic respiratory failure with hypoxia: Secondary | ICD-10-CM | POA: Diagnosis not present

## 2023-11-02 DIAGNOSIS — D62 Acute posthemorrhagic anemia: Secondary | ICD-10-CM | POA: Diagnosis not present

## 2023-11-02 DIAGNOSIS — Z905 Acquired absence of kidney: Secondary | ICD-10-CM | POA: Diagnosis not present

## 2023-11-02 DIAGNOSIS — I5032 Chronic diastolic (congestive) heart failure: Secondary | ICD-10-CM | POA: Diagnosis not present

## 2023-11-02 DIAGNOSIS — J80 Acute respiratory distress syndrome: Secondary | ICD-10-CM | POA: Diagnosis not present

## 2023-11-02 DIAGNOSIS — R918 Other nonspecific abnormal finding of lung field: Secondary | ICD-10-CM | POA: Diagnosis not present

## 2023-11-02 DIAGNOSIS — K219 Gastro-esophageal reflux disease without esophagitis: Secondary | ICD-10-CM | POA: Diagnosis not present

## 2023-11-02 DIAGNOSIS — R160 Hepatomegaly, not elsewhere classified: Secondary | ICD-10-CM | POA: Diagnosis not present

## 2023-11-02 DIAGNOSIS — J441 Chronic obstructive pulmonary disease with (acute) exacerbation: Secondary | ICD-10-CM | POA: Diagnosis not present

## 2023-11-02 DIAGNOSIS — G9341 Metabolic encephalopathy: Secondary | ICD-10-CM | POA: Diagnosis not present

## 2023-11-02 DIAGNOSIS — R451 Restlessness and agitation: Secondary | ICD-10-CM | POA: Diagnosis not present

## 2023-11-02 DIAGNOSIS — F10131 Alcohol abuse with withdrawal delirium: Secondary | ICD-10-CM | POA: Diagnosis not present

## 2023-11-02 DIAGNOSIS — K6389 Other specified diseases of intestine: Secondary | ICD-10-CM | POA: Diagnosis not present

## 2023-11-02 DIAGNOSIS — R4182 Altered mental status, unspecified: Secondary | ICD-10-CM | POA: Diagnosis not present

## 2023-11-02 DIAGNOSIS — K439 Ventral hernia without obstruction or gangrene: Secondary | ICD-10-CM | POA: Diagnosis not present

## 2023-11-02 DIAGNOSIS — R569 Unspecified convulsions: Secondary | ICD-10-CM | POA: Diagnosis not present

## 2023-11-02 DIAGNOSIS — R0902 Hypoxemia: Secondary | ICD-10-CM | POA: Diagnosis not present

## 2023-11-02 DIAGNOSIS — M7989 Other specified soft tissue disorders: Secondary | ICD-10-CM | POA: Diagnosis not present

## 2023-11-02 DIAGNOSIS — J45909 Unspecified asthma, uncomplicated: Secondary | ICD-10-CM | POA: Diagnosis not present

## 2023-11-02 DIAGNOSIS — K7682 Hepatic encephalopathy: Secondary | ICD-10-CM | POA: Diagnosis present

## 2023-11-02 DIAGNOSIS — D72829 Elevated white blood cell count, unspecified: Secondary | ICD-10-CM | POA: Diagnosis not present

## 2023-11-02 DIAGNOSIS — L7632 Postprocedural hematoma of skin and subcutaneous tissue following other procedure: Secondary | ICD-10-CM | POA: Diagnosis not present

## 2023-11-02 DIAGNOSIS — Y836 Removal of other organ (partial) (total) as the cause of abnormal reaction of the patient, or of later complication, without mention of misadventure at the time of the procedure: Secondary | ICD-10-CM | POA: Diagnosis not present

## 2023-11-02 DIAGNOSIS — R939 Diagnostic imaging inconclusive due to excess body fat of patient: Secondary | ICD-10-CM | POA: Diagnosis not present

## 2023-11-02 DIAGNOSIS — I4729 Other ventricular tachycardia: Secondary | ICD-10-CM | POA: Diagnosis not present

## 2023-11-02 DIAGNOSIS — J439 Emphysema, unspecified: Secondary | ICD-10-CM | POA: Diagnosis not present

## 2023-11-02 DIAGNOSIS — R162 Hepatomegaly with splenomegaly, not elsewhere classified: Secondary | ICD-10-CM | POA: Diagnosis not present

## 2023-11-02 DIAGNOSIS — Z4682 Encounter for fitting and adjustment of non-vascular catheter: Secondary | ICD-10-CM | POA: Diagnosis not present

## 2023-11-02 LAB — CBC
HCT: 35.7 % — ABNORMAL LOW (ref 39.0–52.0)
Hemoglobin: 11.1 g/dL — ABNORMAL LOW (ref 13.0–17.0)
MCH: 31.7 pg (ref 26.0–34.0)
MCHC: 31.1 g/dL (ref 30.0–36.0)
MCV: 102 fL — ABNORMAL HIGH (ref 80.0–100.0)
Platelets: 228 10*3/uL (ref 150–400)
RBC: 3.5 MIL/uL — ABNORMAL LOW (ref 4.22–5.81)
RDW: 13.1 % (ref 11.5–15.5)
WBC: 16.2 10*3/uL — ABNORMAL HIGH (ref 4.0–10.5)
nRBC: 0 % (ref 0.0–0.2)

## 2023-11-02 LAB — COMPREHENSIVE METABOLIC PANEL WITH GFR
ALT: 26 U/L (ref 0–44)
AST: 64 U/L — ABNORMAL HIGH (ref 15–41)
Albumin: 3.5 g/dL (ref 3.5–5.0)
Alkaline Phosphatase: 59 U/L (ref 38–126)
Anion gap: 12 (ref 5–15)
BUN: 30 mg/dL — ABNORMAL HIGH (ref 6–20)
CO2: 23 mmol/L (ref 22–32)
Calcium: 8.4 mg/dL — ABNORMAL LOW (ref 8.9–10.3)
Chloride: 89 mmol/L — ABNORMAL LOW (ref 98–111)
Creatinine, Ser: 2.82 mg/dL — ABNORMAL HIGH (ref 0.61–1.24)
GFR, Estimated: 26 mL/min — ABNORMAL LOW (ref >60)
Glucose, Bld: 158 mg/dL — ABNORMAL HIGH (ref 70–99)
Potassium: 5.1 mmol/L (ref 3.5–5.1)
Sodium: 124 mmol/L — ABNORMAL LOW (ref 135–145)
Total Bilirubin: 0.7 mg/dL (ref 0.0–1.2)
Total Protein: 6.9 g/dL (ref 6.5–8.1)

## 2023-11-02 LAB — ECHOCARDIOGRAM COMPLETE
Area-P 1/2: 3.84 cm2
Calc EF: 49.7 %
Height: 75 in
S' Lateral: 4.1 cm
Single Plane A2C EF: 51.2 %
Single Plane A4C EF: 52.9 %
Weight: 4761.94 [oz_av]

## 2023-11-02 LAB — GLUCOSE, CAPILLARY
Glucose-Capillary: 129 mg/dL — ABNORMAL HIGH (ref 70–99)
Glucose-Capillary: 156 mg/dL — ABNORMAL HIGH (ref 70–99)
Glucose-Capillary: 176 mg/dL — ABNORMAL HIGH (ref 70–99)
Glucose-Capillary: 252 mg/dL — ABNORMAL HIGH (ref 70–99)
Glucose-Capillary: 233 mg/dL — ABNORMAL HIGH (ref 70–99)

## 2023-11-02 LAB — MAGNESIUM: Magnesium: 2.5 mg/dL — ABNORMAL HIGH (ref 1.7–2.4)

## 2023-11-02 LAB — PHOSPHORUS: Phosphorus: 4 mg/dL (ref 2.5–4.6)

## 2023-11-02 LAB — BRAIN NATRIURETIC PEPTIDE: B Natriuretic Peptide: 90.6 pg/mL (ref 0.0–100.0)

## 2023-11-02 MED ORDER — LORAZEPAM 1 MG PO TABS
0.0000 mg | ORAL_TABLET | Freq: Four times a day (QID) | ORAL | Status: DC
  Administered 2023-11-02 – 2023-11-03 (×2): 1 mg via ORAL
  Administered 2023-11-04: 2 mg via ORAL
  Administered 2023-11-04: 1 mg via ORAL
  Filled 2023-11-02: qty 2
  Filled 2023-11-02 (×3): qty 1

## 2023-11-02 MED ORDER — THIAMINE HCL 100 MG/ML IJ SOLN
100.0000 mg | Freq: Every day | INTRAMUSCULAR | Status: DC
  Administered 2023-11-07: 100 mg via INTRAVENOUS
  Filled 2023-11-02: qty 2

## 2023-11-02 MED ORDER — LACTATED RINGERS IV SOLN: INTRAVENOUS | Status: AC

## 2023-11-02 MED ORDER — FOLIC ACID 1 MG PO TABS
1.0000 mg | ORAL_TABLET | Freq: Every day | ORAL | Status: DC
  Administered 2023-11-02 – 2023-11-04 (×3): 1 mg via ORAL
  Filled 2023-11-02 (×3): qty 1

## 2023-11-02 MED ORDER — LACTATED RINGERS IV BOLUS
1000.0000 mL | Freq: Once | INTRAVENOUS | Status: AC
  Administered 2023-11-02: 1000 mL via INTRAVENOUS

## 2023-11-02 MED ORDER — LORAZEPAM 1 MG PO TABS
0.0000 mg | ORAL_TABLET | Freq: Two times a day (BID) | ORAL | Status: DC
  Administered 2023-11-04: 2 mg via ORAL
  Filled 2023-11-02: qty 2

## 2023-11-02 MED ORDER — PERFLUTREN LIPID MICROSPHERE
1.0000 mL | INTRAVENOUS | Status: AC | PRN
Start: 2023-11-02 — End: 2023-11-02
  Administered 2023-11-02: 2 mL via INTRAVENOUS

## 2023-11-02 MED ORDER — LORAZEPAM 1 MG PO TABS
1.0000 mg | ORAL_TABLET | ORAL | Status: AC | PRN
  Administered 2023-11-02: 1 mg via ORAL
  Administered 2023-11-04: 2 mg via ORAL
  Filled 2023-11-02 (×2): qty 1
  Filled 2023-11-02: qty 2

## 2023-11-02 MED ORDER — BUDESONIDE 0.5 MG/2ML IN SUSP
0.5000 mg | Freq: Two times a day (BID) | RESPIRATORY_TRACT | Status: DC
  Administered 2023-11-03 – 2023-11-13 (×21): 0.5 mg via RESPIRATORY_TRACT
  Filled 2023-11-02 (×23): qty 2

## 2023-11-02 MED ORDER — ADULT MULTIVITAMIN W/MINERALS CH
1.0000 | ORAL_TABLET | Freq: Every day | ORAL | Status: DC
  Administered 2023-11-02 – 2023-11-04 (×3): 1 via ORAL
  Filled 2023-11-02 (×3): qty 1

## 2023-11-02 MED ORDER — IPRATROPIUM-ALBUTEROL 0.5-2.5 (3) MG/3ML IN SOLN
3.0000 mL | Freq: Four times a day (QID) | RESPIRATORY_TRACT | Status: DC
  Administered 2023-11-02 – 2023-11-03 (×2): 3 mL via RESPIRATORY_TRACT
  Filled 2023-11-02 (×2): qty 3

## 2023-11-02 MED ORDER — METHYLPREDNISOLONE SODIUM SUCC 40 MG IJ SOLR
40.0000 mg | Freq: Once | INTRAMUSCULAR | Status: AC
  Administered 2023-11-02: 40 mg via INTRAVENOUS
  Filled 2023-11-02: qty 1

## 2023-11-02 MED ORDER — NICOTINE 21 MG/24HR TD PT24
21.0000 mg | MEDICATED_PATCH | Freq: Every day | TRANSDERMAL | Status: DC
  Administered 2023-11-02 – 2023-11-12 (×11): 21 mg via TRANSDERMAL
  Filled 2023-11-02 (×11): qty 1

## 2023-11-02 MED ORDER — LORAZEPAM 2 MG/ML IJ SOLN
1.0000 mg | INTRAMUSCULAR | Status: AC | PRN
  Administered 2023-11-02 – 2023-11-03 (×3): 2 mg via INTRAVENOUS
  Administered 2023-11-04 (×2): 4 mg via INTRAVENOUS
  Administered 2023-11-04: 2 mg via INTRAVENOUS
  Administered 2023-11-04 – 2023-11-05 (×2): 4 mg via INTRAVENOUS
  Administered 2023-11-05: 2 mg via INTRAVENOUS
  Filled 2023-11-02 (×2): qty 1
  Filled 2023-11-02 (×2): qty 2
  Filled 2023-11-02 (×5): qty 1
  Filled 2023-11-02 (×2): qty 2

## 2023-11-02 MED ORDER — SODIUM ZIRCONIUM CYCLOSILICATE 10 G PO PACK: 10.0000 g | PACK | Freq: Three times a day (TID) | ORAL | Status: DC

## 2023-11-02 MED ORDER — THIAMINE MONONITRATE 100 MG PO TABS
100.0000 mg | ORAL_TABLET | Freq: Every day | ORAL | Status: DC
Start: 2023-11-02 — End: 2023-11-08
  Administered 2023-11-02 – 2023-11-08 (×6): 100 mg via ORAL
  Filled 2023-11-02 (×7): qty 1

## 2023-11-02 MED ORDER — ALBUTEROL SULFATE (2.5 MG/3ML) 0.083% IN NEBU: 2.5000 mg | INHALATION_SOLUTION | Freq: Once | RESPIRATORY_TRACT | Status: DC

## 2023-11-02 MED ORDER — HYDROMORPHONE HCL 1 MG/ML IJ SOLN: 1.0000 mg | INTRAMUSCULAR | Status: DC | PRN

## 2023-11-02 NOTE — Progress Notes (Signed)
 Patient ID: Preston [name], male   DOB: [birth year], 53 y.o.   MRN: [id number]   Patient has no complaints and wants to go home Abdomen is soft on exam and minimally tender  Ok from general surgery to go home today

## 2023-11-02 NOTE — Discharge Summary (Deleted)
 Physician Discharge Summary  Patient ID: Preston [name] MRN: [id number] DOB/AGE: Apr 13, 1971 53 y.o.  Admit date: 10/31/2023 Discharge date: 11/02/2023  Admission Diagnoses:  Discharge Diagnoses:  Principal Problem:   Neoplasm of right kidney Active Problems:   Tobacco abuse   GERD (gastroesophageal reflux disease)   Alcohol abuse   Chronic diastolic congestive heart failure (HCC)   Diastasis recti   Incarcerated incisional hernia s/p repair w mesh 10/31/2023   History of Left Renal cell cancer   Discharged Condition: good  Hospital Course: 53 year old male underwent robotic and laparoscopic right partial nephrectomy and repair of abdominal hernia with mesh.  On postoperative day 1, his oxygen saturations were 84% on room air and required oxygen to ambulate with physical therapy.  Nursing reported that he had some wheezing.  Kept another day for observation.  Following morning, the patient reported that he ambulated overnight without oxygen and got up to 92% on room air with ambulation.  He no longer had any wheezing and was in stable condition.  Consults: None  Significant Diagnostic Studies: None  Treatments: As above  Discharge Exam: Blood pressure 107/60, pulse 85, temperature 97.8 F (36.6 C), temperature source Oral, resp. rate 16, height 6\' 3"  (1.905 m), weight 135 kg, SpO2 92%. General appearance: alert, no acute distress Adequate perfusion of extremities Nonlabored respiration Abdomen soft, nontender, nondistended, incisions clean dry and intact  Disposition: Discharge disposition: 01-Home or Self Care       Discharge Instructions     Call MD for:   Complete by: As directed    FEVER > 101.5 F  (temperatures < 101.5 F are not significant)   Call MD for:  extreme fatigue   Complete by: As directed    Call MD for:  persistant dizziness or light-headedness   Complete by: As directed    Call MD for:  persistant nausea and vomiting   Complete by: As directed     Call MD for:  redness, tenderness, or signs of infection (pain, swelling, redness, odor or green/yellow discharge around incision site)   Complete by: As directed    Call MD for:  severe uncontrolled pain   Complete by: As directed    Diet - low sodium heart healthy   Complete by: As directed    Start with a bland diet such as soups, liquids, starchy foods, low fat foods, etc. the first few days at home. Gradually advance to a solid, low-fat, high fiber diet by the end of the first week at home.   Add a fiber supplement to your diet (Metamucil, etc) If you feel full, bloated, or constipated, stay on a full liquid or pureed/blenderized diet for a few days until you feel better and are no longer constipated.   Discharge instructions   Complete by: As directed    See Discharge Instructions If you are not getting better after two weeks or are noticing you are getting worse, contact our office [phone] for further advice.  We may need to adjust your medications, re-evaluate you in the office, send you to the emergency room, or see what other things we can do to help. The clinic staff is available to answer your questions during regular business hours (8:30am-5pm).  Please don't hesitate to call and ask to speak to one of our nurses for clinical concerns.    A surgeon from [facility] is always on call at the hospitals 24 hours/day If you have a medical emergency, go to the  nearest emergency room or call 911.   Discharge wound care:   Complete by: As directed    It is good for closed incisions and even open wounds to be washed every day.  Shower every day.  Short baths are fine.  Wash the incisions and wounds clean with soap & water.    You may leave closed incisions open to air if it is dry.   You may cover the incision with clean gauze & replace it after your daily shower for comfort.  TEGADERM & STERISTRIPS:  You have clear gauze band-aid dressings over your closed incision(s).   You also have skin tapes called Steristrips on your incisions.  Leave these in place.  You may trim any edges that curl up with clean scissors.  You may remove all dressings & tapes in the shower in 2 days, Saturday 4/5.   Driving Restrictions   Complete by: As directed    You may drive when: - you are no longer taking narcotic prescription pain medication - you can comfortably wear a seatbelt - you can safely make sudden turns/stops without pain.   Increase activity slowly   Complete by: As directed    Start light daily activities --- self-care, walking, climbing stairs- beginning the day after surgery.  Gradually increase activities as tolerated.  Control your pain to be active.  Stop when you are tired.  Ideally, walk several times a day, eventually an hour a day.   Most people are back to most day-to-day activities in a few weeks.  It takes 4-6 weeks to get back to unrestricted, intense activity. If you can walk 30 minutes without difficulty, it is safe to try more intense activity such as jogging, treadmill, bicycling, low-impact aerobics, swimming, etc. Save the most intensive and strenuous activity for last (Usually 4-8 weeks after surgery) such as sit-ups, heavy lifting, contact sports, etc.  Refrain from any intense heavy lifting or straining until you are off narcotics for pain control.  You will have off days, but things should improve week-by-week. DO NOT PUSH THROUGH PAIN.  Let pain be your guide: If it hurts to do something, don't do it.   Lifting restrictions   Complete by: As directed    If you can walk 30 minutes without difficulty, it is safe to try more intense activity such as jogging, treadmill, bicycling, low-impact aerobics, swimming, etc. Save the most intensive and strenuous activity for last (Usually 4-8 weeks after surgery) such as sit-ups, heavy lifting, contact sports, etc.   Refrain from any intense heavy lifting or straining until you are off narcotics for pain  control.  You will have off days, but things should improve week-by-week. DO NOT PUSH THROUGH PAIN.  Let pain be your guide: If it hurts to do something, don't do it.  Pain is your body warning you to avoid that activity for another week until the pain goes down.   May shower / Bathe   Complete by: As directed    May walk up steps   Complete by: As directed    No wound care   Complete by: As directed    Remove dressing in 48 hours   Complete by: As directed    Make sure all dressings have been removed on the second day after surgery = 4/5 Saturday Leave incisions open to air.  OK to cover incisions with gauze or bandages as desired   Sexual Activity Restrictions   Complete by: As directed  You may have sexual intercourse when it is comfortable. If it hurts to do something, stop.      Allergies as of 11/02/2023       Reactions   Morphine Itching, Nausea And Vomiting   Ace Inhibitors Cough   Adhesive [tape] Itching, Rash        Medication List     TAKE these medications    Accu-Chek Aviva Plus test strip Generic drug: glucose blood Use as instructed for 3 times daily testing of blood sugar. E11.9   Accu-Chek Aviva Plus w/Device Kit 1 each by Does not apply route 3 (three) times daily.   albuterol (2.5 MG/3ML) 0.083% nebulizer solution Commonly known as: PROVENTIL Take 3 mLs (2.5 mg total) by nebulization every 6 (six) hours as needed for wheezing or shortness of breath.   Ventolin HFA 108 (90 Base) MCG/ACT inhaler Generic drug: albuterol Inhale 1-2 puffs into the lungs every 6 (six) hours as needed for wheezing or shortness of breath.   allopurinol 300 MG tablet Commonly known as: ZYLOPRIM Take 1 tablet (300 mg total) by mouth daily.   amLODipine 10 MG tablet Commonly known as: NORVASC Take 1 tablet (10 mg total) by mouth at bedtime.   atorvastatin 40 MG tablet Commonly known as: LIPITOR Take 1 tablet (40 mg total) by mouth every evening.   Blood Pressure  Monitor Devi Please provide patient with insurance approved blood pressure monitor I10.0   busPIRone 15 MG tablet Commonly known as: BUSPAR TAKE 1 TO 2 TABLETS BY MOUTH TWICE DAILY FOR ANXIETY What changed: See the new instructions.   calcium carbonate 750 MG chewable tablet Commonly known as: TUMS EX Chew 1 tablet (750 mg total) by mouth 3 (three) times daily as needed for heartburn.   colchicine 0.6 MG tablet Take 1 capsule (0.6 mg total) by mouth daily as needed. M10.9   docusate sodium 100 MG capsule Commonly known as: COLACE Take 1 capsule (100 mg total) by mouth 2 (two) times daily.   DULoxetine 60 MG capsule Commonly known as: CYMBALTA Take 1 capsule (60 mg total) by mouth daily. What changed: when to take this   famotidine 20 MG tablet Commonly known as: PEPCID Take 1 tablet (20 mg total) by mouth every evening.   gabapentin 300 MG capsule Commonly known as: NEURONTIN Take 3 capsules (900 mg total) by mouth 2 (two) times daily.   hydrOXYzine 50 MG tablet Commonly known as: ATARAX Take 1 tablet (50 mg total) by mouth 3 (three) times daily as needed. for anxiety   metFORMIN 500 MG tablet Commonly known as: GLUCOPHAGE Take 2 tablets (1,000 mg total) by mouth 2 (two) times daily with a meal. TAKE 2 TABLETS BY MOUTH TWICE DAILY WITH  A  MEAL. OFFICE VISIT NEEDED FOR ADDITIONAL REFILLS   metoprolol tartrate 50 MG tablet Commonly known as: LOPRESSOR Take 1 tablet (50 mg total) by mouth 2 (two) times daily.   omeprazole 40 MG capsule Commonly known as: PRILOSEC Take 1 capsule (40 mg total) by mouth daily.   oxyCODONE-acetaminophen 10-325 MG tablet Commonly known as: Percocet Take 1 tablet by mouth every 6 (six) hours as needed for pain.   Symbicort 80-4.5 MCG/ACT inhaler Generic drug: budesonide-formoterol Inhale 2 puffs into the lungs in the morning and at bedtime.   valsartan 80 MG tablet Commonly known as: Diovan Take 1 tablet (80 mg total) by mouth  daily.               Discharge  Care Instructions  (From admission, onward)           Start     Ordered   10/31/23 0000  Discharge wound care:       Comments: It is good for closed incisions and even open wounds to be washed every day.  Shower every day.  Short baths are fine.  Wash the incisions and wounds clean with soap & water.    You may leave closed incisions open to air if it is dry.   You may cover the incision with clean gauze & replace it after your daily shower for comfort.  TEGADERM & STERISTRIPS:  You have clear gauze band-aid dressings over your closed incision(s).  You also have skin tapes called Steristrips on your incisions.  Leave these in place.  You may trim any edges that curl up with clean scissors.  You may remove all dressings & tapes in the shower in 2 days, Saturday 4/5.   10/31/23 1610            Follow-up Information     Karie Soda, MD Follow up on 11/18/2023.   Specialties: General Surgery, Colon and Rectal Surgery Why: To follow up after your operation Contact information: [address] [place] [address] [phone]         Heloise Purpura, MD Follow up on 11/20/2023.   Specialty: Urology Why: at 12:45 Contact information: [address] [place] [address] [phone]                 Signed: Ray Church, III 11/02/2023, 10:33 AM

## 2023-11-02 NOTE — Consult Note (Signed)
 Initial Consultation Note   Patient: Preston [name] AVW:[id number] DOB: 23-Feb-1971 PCP: Claiborne Rigg, NP DOA: 10/31/2023 DOS: the patient was seen and examined on 11/02/2023 Primary service: Heloise Purpura, MD Referring physician: Modena Slater III, MD. Reason for consult: Hypoxia, AKI and electrolyte disturbances.  Assessment/Plan: Principal Problem:   Neoplasm of right kidney   Diastasis recti   Incarcerated incisional hernia s/p repair w mesh 10/31/2023   History of Left Renal cell cancer Continue management per primary team.  Active Problems:   Acute respiratory failure with hypoxia (HCC)    Acute kidney injury (HCC) Observation/telemetry. LR 1000 mL bolus. -This will be followed by LR 150 mL/h x 20 hours. Hold ARB/ACE. Avoid hypotension. Avoid nephrotoxins. Monitor intake and output. Monitor renal function electrolytes.    Hyponatremia Beer plus toxemia? Fluid restriction. Continue IV fluids. Check urine sodium and osmolality.    Tobacco abuse Tobacco cessation advised. Nicotine replacement therapy ordered.    Alcohol abuse CIWA protocol with lorazepam. Magnesium sulfate supplementation. Folate, MVI and thiamine. Consult TOC team.    Chronic diastolic congestive heart failure (HCC) The patient is volume depleted. Continue metoprolol succinate. Continue holding valsartan.     Type 2 diabetes mellitus treated without insulin (HCC) Carbohydrate modified diet. Continue CBG monitoring with RI SS. Hemoglobin A1c was 6.4% 2 days ago..    Essential hypertension Continue amlodipine 10 mg p.o. bedtime. Continue metoprolol to tartrate 50 mg p.o. twice daily.    Obesity, Class II, BMI 35-39.9 Current BMI 37.20 kg/m. Would benefit from lifestyle modifications. Follow-up closely with PCP and/or bariatric clinic.    HLD (hyperlipidemia) Continue atorvastatin 40 mg p.o. daily.    Gastroesophageal reflux disease without esophagitis On pantoprazole 80 mg p.o.  daily.    TRH will continue to follow the patient.  HPI: Preston [name] is a 53 y.o. male with past medical history of tobacco abuse, alcohol abuse, hypertension, hyperlipidemia, GERD, gout, anxiety, C. difficile colitis history, panic attack, class II obesity, grade 2 diastolic dysfunction, asthma, COPD who underwent right robotic assisted laparoscopic partial nephrectomy and then a robotic plus laparoscopic repair of abdominal hernia with mesh, Tap block bilateral who we are seeing due to new oxygen requirement, AKI with electrolyte disturbances.  He stated that he has been wheezing and becomes dyspneic very easily.  No chest pains, palpitations, diaphoresis or recent lower extremity edema.  He is still an active smoker.  He drinks half to a full 12 pack of beers daily.  Review of Systems: As mentioned in the history of present illness. All other systems reviewed and are negative. Past Medical History:  Diagnosis Date   Anxiety    Asthma    daily inhaler   Cancer (HCC)    pt. reports diagnosed with skin cancer on face/uncertain type but he reports may be basal cell, pending surgery to remove 09/03/18   CHF (congestive heart failure) (HCC)    no cardiologist   Chronic cough    states due to COPD   Chronic lower back pain    COPD (chronic obstructive pulmonary disease) (HCC)    no home O2   Diverticulitis 02/26/2013   Dyspnea    with humidity and high pollen count   Edentulous    upper gum   GERD (gastroesophageal reflux disease)    Gout    History of Clostridium difficile infection 2014   Hypercholesterolemia    Hypertension    states under control with med., has been on med. x 4 yr.  Insulin dependent diabetes mellitus    type 2    Osteoarthritis    bilat. hip   Panic attacks    Shoulder pain, right 04/18/2017   Past Surgical History:  Procedure Laterality Date   COLON SURGERY     14 inches of colon removed   COLONOSCOPY WITH PROPOFOL N/A 04/01/2013   Procedure:  COLONOSCOPY WITH PROPOFOL;  Surgeon: Willis Modena, MD;  Location: [facility];  Service: Endoscopy;  Laterality: N/A;   FLEXIBLE SIGMOIDOSCOPY N/A 02/28/2013   Procedure: FLEXIBLE SIGMOIDOSCOPY;  Surgeon: Vertell Novak., MD;  Location: [facility];  Service: Endoscopy;  Laterality: N/A;   LAPAROSCOPIC APPENDECTOMY  07/05/2009   LAPAROSCOPIC LOW ANTERIOR RESECTION N/A 07/08/2013   Procedure: LAPAROSCOPIC LOW ANTERIOR RESECTION WITH TAKEDOWN OF SPLENIC FLEXURE.;  Surgeon: Ernestene Mention, MD;  Location: [facility] OR;  Service: General;  Laterality: N/A;   MANDIBLE SURGERY Left 2010   "dry skin pocket cut out"   MULTIPLE EXTRACTIONS WITH ALVEOLOPLASTY N/A 04/24/2017   Procedure: SURGICAL REMOVAL OF TEETH NUMBERS `7 20-29 AND 31;  Surgeon: Vivia Ewing, DMD;  Location: [facility];  Service: Oral Surgery;  Laterality: N/A;   ROBOT ASSISTED LAPAROSCOPIC NEPHRECTOMY Right 10/31/2023   Procedure: XI RIGHT ROBOTIC ASSISTED LAPAROSCOPIC PARTIAL NEPHRECTOMY;  Surgeon: Heloise Purpura, MD;  Location: [facility]S;  Service: Urology;  Laterality: Right;  150 MINUTES NEEDED FOR CASE   ROBOTIC ASSITED PARTIAL NEPHRECTOMY Left 05/02/2020   Procedure: XI ROBOTIC ASSITED LAPAROSCOPIC PARTIAL NEPHRECTOMY;  Surgeon: Heloise Purpura, MD;  Location: [facility];  Service: Urology;  Laterality: Left;   TOTAL HIP ARTHROPLASTY Left 06/25/2016   Procedure: TOTAL HIP ARTHROPLASTY ANTERIOR APPROACH;  Surgeon: Gean Birchwood, MD;  Location: [facility] OR;  Service: Orthopedics;  Laterality: Left;   TOTAL HIP ARTHROPLASTY Right 09/10/2016   Procedure: TOTAL HIP ARTHROPLASTY ANTERIOR APPROACH;  Surgeon: Gean Birchwood, MD;  Location: [facility] OR;  Service: Orthopedics;  Laterality: Right;   XI ROBOTIC ASSISTED VENTRAL HERNIA N/A 10/31/2023   Procedure: ROBOTIC + LAPAROSCOPIC REPAIR OF ABDOMINAL HERNIA WITH MESH, TAP BLOCK - BILATERAL;  Surgeon: Karie Soda, MD;  Location: [facility];  Service: General;  Laterality: N/A;   Social History:  reports that  he has been smoking cigarettes. He has a 35 pack-year smoking history. He has quit using smokeless tobacco.  His smokeless tobacco use included chew. He reports current alcohol use. He reports that he does not use drugs.  Allergies  Allergen Reactions   Morphine Itching and Nausea And Vomiting   Ace Inhibitors Cough   Adhesive [Tape] Itching and Rash    Family History  Problem Relation Age of Onset   Cancer Father        lymphoma   Heart attack Mother     Prior to Admission medications   Medication Sig Start Date End Date Taking? Authorizing Provider  albuterol (PROVENTIL) (2.5 MG/3ML) 0.083% nebulizer solution Take 3 mLs (2.5 mg total) by nebulization every 6 (six) hours as needed for wheezing or shortness of breath. 11/25/22  Yes Claiborne Rigg, NP  allopurinol (ZYLOPRIM) 300 MG tablet Take 1 tablet (300 mg total) by mouth daily. 07/15/23  Yes Claiborne Rigg, NP  amLODipine (NORVASC) 10 MG tablet Take 1 tablet (10 mg total) by mouth at bedtime. 07/15/23  Yes Claiborne Rigg, NP  atorvastatin (LIPITOR) 40 MG tablet Take 1 tablet (40 mg total) by mouth every evening. 07/15/23  Yes Claiborne Rigg, NP  busPIRone (BUSPAR) 15 MG tablet TAKE  1 TO 2 TABLETS BY MOUTH TWICE DAILY FOR ANXIETY Patient taking differently: Take 15 mg by mouth 2 (two) times daily. 10/22/23  Yes Claiborne Rigg, NP  calcium carbonate (TUMS EX) 750 MG chewable tablet Chew 1 tablet (750 mg total) by mouth 3 (three) times daily as needed for heartburn. 11/25/22  Yes Claiborne Rigg, NP  colchicine 0.6 MG tablet Take 1 capsule (0.6 mg total) by mouth daily as needed. M10.9 07/16/23  Yes Hoy Register, MD  docusate sodium (COLACE) 100 MG capsule Take 1 capsule (100 mg total) by mouth 2 (two) times daily. 10/31/23  Yes Dancy, Marchelle Folks, PA-C  DULoxetine (CYMBALTA) 60 MG capsule Take 1 capsule (60 mg total) by mouth daily. Patient taking differently: Take 60 mg by mouth at bedtime. 07/15/23  Yes Claiborne Rigg, NP   gabapentin (NEURONTIN) 300 MG capsule Take 3 capsules (900 mg total) by mouth 2 (two) times daily. 07/15/23  Yes Claiborne Rigg, NP  hydrOXYzine (ATARAX) 50 MG tablet Take 1 tablet (50 mg total) by mouth 3 (three) times daily as needed. for anxiety 07/15/23  Yes Claiborne Rigg, NP  metFORMIN (GLUCOPHAGE) 500 MG tablet Take 2 tablets (1,000 mg total) by mouth 2 (two) times daily with a meal. TAKE 2 TABLETS BY MOUTH TWICE DAILY WITH  A  MEAL. OFFICE VISIT NEEDED FOR ADDITIONAL REFILLS 07/15/23  Yes Claiborne Rigg, NP  metoprolol tartrate (LOPRESSOR) 50 MG tablet Take 1 tablet (50 mg total) by mouth 2 (two) times daily. 07/15/23  Yes Claiborne Rigg, NP  omeprazole (PRILOSEC) 40 MG capsule Take 1 capsule (40 mg total) by mouth daily. 07/15/23  Yes Claiborne Rigg, NP  oxyCODONE-acetaminophen (PERCOCET) 10-325 MG tablet Take 1 tablet by mouth every 6 (six) hours as needed for pain. 10/31/23 10/30/24 Yes Dancy, Marchelle Folks, PA-C  SYMBICORT 80-4.5 MCG/ACT inhaler Inhale 2 puffs into the lungs in the morning and at bedtime. 07/15/23  Yes Claiborne Rigg, NP  valsartan (DIOVAN) 80 MG tablet Take 1 tablet (80 mg total) by mouth daily. 08/12/23  Yes Conte, Tessa N, PA-C  VENTOLIN HFA 108 (90 Base) MCG/ACT inhaler Inhale 1-2 puffs into the lungs every 6 (six) hours as needed for wheezing or shortness of breath. 10/14/23  Yes Claiborne Rigg, NP  Blood Glucose Monitoring Suppl (ACCU-CHEK AVIVA PLUS) w/Device KIT 1 each by Does not apply route 3 (three) times daily. 11/07/16   Quentin Angst, MD  Blood Pressure Monitor DEVI Please provide patient with insurance approved blood pressure monitor I10.0 07/31/22   Claiborne Rigg, NP  famotidine (PEPCID) 20 MG tablet Take 1 tablet (20 mg total) by mouth every evening. Patient not taking: Reported on 10/25/2023 07/15/23   Claiborne Rigg, NP  glucose blood (ACCU-CHEK AVIVA PLUS) test strip Use as instructed for 3 times daily testing of blood sugar. E11.9 07/15/23    Claiborne Rigg, NP    Physical Exam: Vitals:   11/01/23 2009 11/02/23 0425 11/02/23 0759 11/02/23 1334  BP: (!) 120/58 107/60  112/70  Pulse: 92 85  80  Resp: 16 16  18   Temp: 98.2 F (36.8 C) 97.8 F (36.6 C)  98 F (36.7 C)  TempSrc: Oral Oral  Oral  SpO2: 93% 90% 92% 94%  Weight:      Height:       Physical Exam Vitals and nursing note reviewed.  Constitutional:      General: He is awake. He is not in acute  distress.    Appearance: Normal appearance. He is ill-appearing.     Interventions: Nasal cannula in place.  HENT:     Head: Normocephalic.     Nose: No rhinorrhea.     Mouth/Throat:     Mouth: Mucous membranes are dry.  Eyes:     General: No scleral icterus.    Pupils: Pupils are equal, round, and reactive to light.  Neck:     Vascular: No JVD.  Cardiovascular:     Rate and Rhythm: Normal rate and regular rhythm.     Heart sounds: S1 normal and S2 normal.  Pulmonary:     Breath sounds: Wheezing and rhonchi present. No rales.  Abdominal:     General: Abdomen is protuberant. Bowel sounds are normal. There is no distension.     Palpations: Abdomen is soft.     Tenderness: There is no abdominal tenderness. There is no guarding.  Musculoskeletal:     Cervical back: Neck supple.     Right lower leg: No edema.     Left lower leg: No edema.  Skin:    General: Skin is warm and dry.  Neurological:     General: No focal deficit present.     Mental Status: He is alert and oriented to person, place, and time.  Psychiatric:        Mood and Affect: Mood normal.        Behavior: Behavior normal. Behavior is cooperative.     Data Reviewed:   Results are pending, will review when available. 10/28/2019 echocardiogram complete. IMPRESSIONS:   1. Left ventricular ejection fraction, by estimation, is 55 to 60%. The  left ventricle has normal function. The left ventricle has no regional  wall motion abnormalities. There is mild concentric left ventricular   hypertrophy. Left ventricular diastolic  parameters are consistent with Grade II diastolic dysfunction  (pseudonormalization). Elevated left atrial pressure.   2. Right ventricular systolic function is normal. The right ventricular  size is normal.   3. Left atrial size was mildly dilated.   4. The mitral valve is normal in structure. No evidence of mitral valve  regurgitation. No evidence of mitral stenosis.   5. The aortic valve is normal in structure. Aortic valve regurgitation is  not visualized. No aortic stenosis is present.   6. The inferior vena cava is normal in size with greater than 50%  respiratory variability, suggesting right atrial pressure of 3 mmHg.   Comparison(s): Prior images reviewed side by side. Changes from prior  study are noted. No structural changes are seen, but there is now evidence  of elevated mean left atrial pressure.   EKG: Vent. rate 90 BPM PR interval 186 ms QRS duration 96 ms QT/QTcB 344/420 ms P-R-T axes 35 70 38 Normal sinus rhythm Normal ECG  Family Communication:  Primary team communication:  Thank you very much for involving [place] in the care of your patient.  Author: Bobette Mo, MD 11/02/2023 1:43 PM  For on call review [email or web address].   This document was prepared using Dragon voice recognition software and may contain some unintended transcription errors.

## 2023-11-02 NOTE — Progress Notes (Signed)
 Mobility Specialist - Progress Note Nurse requested Mobility Specialist to perform oxygen saturation test with pt which includes removing pt from oxygen both at rest and while ambulating.  Below are the results from that testing.     Patient Saturations on Room Air at Rest = spO2 81%  Patient Saturations on Room Air while Ambulating = sp02 77% .  Rested and performed pursed lip breathing for 1 minute with sp02 at 86%.  Patient Saturations on 2 Liters of oxygen while Ambulating = sp02 90%  At end of testing pt left in room on 2  Liters of oxygen.  Reported results to nurse.     11/02/23 1227  Mobility  Activity Ambulated with assistance in hallway  Level of Assistance Standby assist, set-up cues, supervision of patient - no hands on  Assistive Device Front wheel walker  Distance Ambulated (ft) 200 ft  Range of Motion/Exercises Active  Activity Response Tolerated fair  Mobility Referral Yes  Mobility visit 1 Mobility  Mobility Specialist Start Time (ACUTE ONLY) 1210  Mobility Specialist Stop Time (ACUTE ONLY) 1227  Mobility Specialist Time Calculation (min) (ACUTE ONLY) 17 min   Pt was found in bed and agreeable to ambulate. SPO2 reading was 81% prior to ambulation and able to increase to 92% within 1 min of encouraged pursed lip breathing. SPO2 reading during ambulation recorded above. At EOS returned to bed with all needs met. Call bell in reach and RN notified of session.  Billey Chang Mobility Specialist

## 2023-11-02 NOTE — Progress Notes (Signed)
 Urology Inpatient Progress Report  Neoplasm of right kidney [D49.511]  Procedure(s): XI RIGHT ROBOTIC ASSISTED LAPAROSCOPIC PARTIAL NEPHRECTOMY ROBOTIC + LAPAROSCOPIC REPAIR OF ABDOMINAL HERNIA WITH MESH, TAP BLOCK - BILATERAL  2 Days Post-Op   Intv/Subj: No acute events overnight. Patient is without complaint.  Worked with the mobility specialist and saturations on room air while ambulating with 77% and was 86% after rest and pursed lip breathing for 1 minute.  Saturations on 2 L of oxygen while ambulating was 90%.  Principal Problem:   Neoplasm of right kidney Active Problems:   Tobacco abuse   GERD (gastroesophageal reflux disease)   Alcohol abuse   Chronic diastolic congestive heart failure (HCC)   Diastasis recti   Incarcerated incisional hernia s/p repair w mesh 10/31/2023   History of Left Renal cell cancer  Current Facility-Administered Medications  Medication Dose Route Frequency Provider Last Rate Last Admin   0.9 %  sodium chloride infusion  250 mL Intravenous PRN Karie Soda, MD       albuterol (PROVENTIL) (2.5 MG/3ML) 0.083% nebulizer solution 2.5 mg  2.5 mg Nebulization Q6H PRN Heloise Purpura, MD   2.5 mg at 11/01/23 1012   allopurinol (ZYLOPRIM) tablet 300 mg  300 mg Oral Daily Heloise Purpura, MD   300 mg at 11/02/23 1020   amLODipine (NORVASC) tablet 10 mg  10 mg Oral Anner Crete, MD   10 mg at 11/01/23 2105   atorvastatin (LIPITOR) tablet 40 mg  40 mg Oral QPM Heloise Purpura, MD   40 mg at 11/01/23 1817   bisacodyl (DULCOLAX) suppository 10 mg  10 mg Rectal Daily Karie Soda, MD       busPIRone (BUSPAR) tablet 15 mg  15 mg Oral BID Heloise Purpura, MD   15 mg at 11/02/23 1018   cyclobenzaprine (FLEXERIL) tablet 5 mg  5 mg Oral TID PRN Heloise Purpura, MD       diphenhydrAMINE (BENADRYL) injection 12.5-25 mg  12.5-25 mg Intravenous Q6H PRN Heloise Purpura, MD       Or   diphenhydrAMINE (BENADRYL) 12.5 MG/5ML elixir 12.5-25 mg  12.5-25 mg Oral Q6H PRN  Heloise Purpura, MD   25 mg at 11/01/23 0419   DULoxetine (CYMBALTA) DR capsule 60 mg  60 mg Oral QHS Heloise Purpura, MD   60 mg at 11/01/23 2105   fluticasone furoate-vilanterol (BREO ELLIPTA) 100-25 MCG/ACT 1 puff  1 puff Inhalation Daily Heloise Purpura, MD   1 puff at 11/02/23 0758   gabapentin (NEURONTIN) capsule 900 mg  900 mg Oral BID Heloise Purpura, MD   900 mg at 11/02/23 1019   guaiFENesin-dextromethorphan (ROBITUSSIN DM) 100-10 MG/5ML syrup 15 mL  15 mL Oral Q4H PRN Karie Soda, MD       insulin aspart (novoLOG) injection 0-15 Units  0-15 Units Subcutaneous Q4H Heloise Purpura, MD   3 Units at 11/02/23 0454   lactated ringers bolus 1,000 mL  1,000 mL Intravenous Q8H PRN Karie Soda, MD       magic mouthwash  15 mL Oral QID PRN Karie Soda, MD       menthol-cetylpyridinium (CEPACOL) lozenge 3 mg  1 lozenge Oral PRN Karie Soda, MD       metoprolol tartrate (LOPRESSOR) tablet 50 mg  50 mg Oral BID Heloise Purpura, MD   50 mg at 11/02/23 1020   naphazoline-glycerin (CLEAR EYES REDNESS) ophth solution 1-2 drop  1-2 drop Both Eyes QID PRN Karie Soda, MD       ondansetron [facility])  injection 4 mg  4 mg Intravenous Q4H PRN Heloise Purpura, MD       Oral care mouth rinse  15 mL Mouth Rinse PRN Heloise Purpura, MD       oxyCODONE-acetaminophen (PERCOCET/ROXICET) 5-325 MG per tablet 1-2 tablet  1-2 tablet Oral Q6H PRN Heloise Purpura, MD   2 tablet at 11/02/23 0616   pantoprazole (PROTONIX) EC tablet 80 mg  80 mg Oral Daily Heloise Purpura, MD   80 mg at 11/02/23 1020   phenol (CHLORASEPTIC) mouth spray 2 spray  2 spray Mouth/Throat PRN Karie Soda, MD       polycarbophil (FIBERCON) tablet 625 mg  625 mg Oral BID Karie Soda, MD   625 mg at 11/02/23 1019   sodium chloride (OCEAN) 0.65 % nasal spray 1-2 spray  1-2 spray Each Nare Q6H PRN Karie Soda, MD       sodium chloride flush (NS) 0.9 % injection 3 mL  3 mL Intravenous Catha Gosselin, MD   3 mL at 11/02/23 1021   sodium chloride  flush (NS) 0.9 % injection 3 mL  3 mL Intravenous PRN Karie Soda, MD         Objective: Vital: Vitals:   11/01/23 1242 11/01/23 2009 11/02/23 0425 11/02/23 0759  BP: 108/74 (!) 120/58 107/60   Pulse: 77 92 85   Resp: 20 16 16    Temp: 98.2 F (36.8 C) 98.2 F (36.8 C) 97.8 F (36.6 C)   TempSrc: Oral Oral Oral   SpO2: 92% 93% 90% 92%  Weight:      Height:       I/Os: I/O last 3 completed shifts: In: 1424.7 [P.O.:240; I.V.:928; IV Piggyback:256.7] Out: 760 [Urine:625; Drains:135]  Physical Exam:  General: Patient is in no apparent distress Lungs: Normal respiratory effort, chest expands symmetrically. GI: Incisions are c/d/i. The abdomen is soft and nontender without mass. Ext: lower extremities symmetric  Lab Results: Recent Labs    10/31/23 1400 11/01/23 0355  HGB 14.8 12.6*  HCT 45.2 38.3*   Recent Labs    10/31/23 0644 10/31/23 1400 11/01/23 0355  NA 125* 126* 127*  K 3.5 6.1* 5.9*  CL 89* 90* 92*  CO2 21* 23 24  GLUCOSE 120* 229* 125*  BUN <5* 8 17  CREATININE 0.43* 1.56* 1.74*  CALCIUM 9.1 8.4* 8.0*   No results for input(s): "LABPT", "INR" in the last 72 hours. No results for input(s): "LABURIN" in the last 72 hours. Results for orders placed or performed in visit on 08/23/22  TIQ-MISC     Status: None   Collection Time: 08/23/22 12:06 PM  Result Value Ref Range Status   QUESTION/PROBLEM:   Final    Comment: . There is a question regarding the following specimen submitted and/or the test requested. .    QUESTION: VERIFY NAME  Final    Comment: REQUESTED INFORMATION _________________________________ . AUTHORIZED SIGNATURE __________________________________ . TO PREVENT FURTHER DELAYS IN TESTING, PLEASE COMPLETE INFORMATION ABOVE AND EITHER FAX TO [phone] OR  EMAIL TO [email or web address] TO  RESOLVE THIS ORDER.     Studies/Results: No results found.  Assessment: Right renal mass Postoperative  hypoxia  Procedure(s): XI RIGHT ROBOTIC ASSISTED LAPAROSCOPIC PARTIAL NEPHRECTOMY ROBOTIC + LAPAROSCOPIC REPAIR OF ABDOMINAL HERNIA WITH MESH, TAP BLOCK - BILATERAL, 2 Days Post-Op  doing well.  Plan: Will obtain chest x-ray.  Continue to encourage incentive spirometry, ambulation.  I will consult hospitalist service given his persistent hypoxia.   Modena Slater, MD Urology 11/02/2023, 1:09  PM

## 2023-11-03 ENCOUNTER — Inpatient Hospital Stay (HOSPITAL_COMMUNITY)

## 2023-11-03 DIAGNOSIS — M7989 Other specified soft tissue disorders: Secondary | ICD-10-CM | POA: Diagnosis not present

## 2023-11-03 DIAGNOSIS — D49511 Neoplasm of unspecified behavior of right kidney: Secondary | ICD-10-CM | POA: Diagnosis not present

## 2023-11-03 LAB — TROPONIN I (HIGH SENSITIVITY)
Troponin I (High Sensitivity): 8 ng/L (ref <18)
Troponin I (High Sensitivity): 6 ng/L

## 2023-11-03 LAB — BASIC METABOLIC PANEL WITH GFR
Anion gap: 10 (ref 5–15)
BUN: 35 mg/dL — ABNORMAL HIGH (ref 6–20)
CO2: 24 mmol/L (ref 22–32)
Calcium: 8.5 mg/dL — ABNORMAL LOW (ref 8.9–10.3)
Chloride: 94 mmol/L — ABNORMAL LOW (ref 98–111)
Creatinine, Ser: 1.87 mg/dL — ABNORMAL HIGH (ref 0.61–1.24)
GFR, Estimated: 43 mL/min — ABNORMAL LOW (ref >60)
Glucose, Bld: 151 mg/dL — ABNORMAL HIGH (ref 70–99)
Potassium: 4.7 mmol/L (ref 3.5–5.1)
Sodium: 128 mmol/L — ABNORMAL LOW (ref 135–145)

## 2023-11-03 LAB — GLUCOSE, CAPILLARY
Glucose-Capillary: 138 mg/dL — ABNORMAL HIGH (ref 70–99)
Glucose-Capillary: 149 mg/dL — ABNORMAL HIGH (ref 70–99)
Glucose-Capillary: 164 mg/dL — ABNORMAL HIGH (ref 70–99)
Glucose-Capillary: 199 mg/dL — ABNORMAL HIGH (ref 70–99)
Glucose-Capillary: 216 mg/dL — ABNORMAL HIGH (ref 70–99)
Glucose-Capillary: 303 mg/dL — ABNORMAL HIGH (ref 70–99)

## 2023-11-03 LAB — CBC
HCT: 32.3 % — ABNORMAL LOW (ref 39.0–52.0)
Hemoglobin: 10.4 g/dL — ABNORMAL LOW (ref 13.0–17.0)
MCH: 31.4 pg (ref 26.0–34.0)
MCHC: 32.2 g/dL (ref 30.0–36.0)
MCV: 97.6 fL (ref 80.0–100.0)
Platelets: 224 10*3/uL (ref 150–400)
RBC: 3.31 MIL/uL — ABNORMAL LOW (ref 4.22–5.81)
RDW: 13.2 % (ref 11.5–15.5)
WBC: 16.5 10*3/uL — ABNORMAL HIGH (ref 4.0–10.5)
nRBC: 0 % (ref 0.0–0.2)

## 2023-11-03 MED ORDER — REVEFENACIN 175 MCG/3ML IN SOLN
175.0000 ug | Freq: Every day | RESPIRATORY_TRACT | Status: DC
  Administered 2023-11-03 – 2023-11-13 (×11): 175 ug via RESPIRATORY_TRACT
  Filled 2023-11-03 (×11): qty 3

## 2023-11-03 MED ORDER — ENOXAPARIN SODIUM 40 MG/0.4ML IJ SOSY
40.0000 mg | PREFILLED_SYRINGE | INTRAMUSCULAR | Status: DC
  Administered 2023-11-03 – 2023-11-04 (×2): 40 mg via SUBCUTANEOUS
  Filled 2023-11-03 (×2): qty 0.4

## 2023-11-03 MED ORDER — ARFORMOTEROL TARTRATE 15 MCG/2ML IN NEBU
15.0000 ug | INHALATION_SOLUTION | Freq: Two times a day (BID) | RESPIRATORY_TRACT | Status: DC
  Administered 2023-11-03 – 2023-11-13 (×21): 15 ug via RESPIRATORY_TRACT
  Filled 2023-11-03 (×23): qty 2

## 2023-11-03 MED ORDER — METHYLPREDNISOLONE SODIUM SUCC 125 MG IJ SOLR
60.0000 mg | Freq: Two times a day (BID) | INTRAMUSCULAR | Status: DC
  Administered 2023-11-03 – 2023-11-04 (×3): 60 mg via INTRAVENOUS
  Filled 2023-11-03 (×3): qty 2

## 2023-11-03 MED ORDER — LACTATED RINGERS IV SOLN: INTRAVENOUS | Status: AC

## 2023-11-03 NOTE — Progress Notes (Signed)
 Mobility Specialist - Progress Note  ([place] 6L) Pre-mobility: 87 bpm HR, 94% SpO2 During mobility: 92 bpm HR, 85% SpO2 Post-mobility: 87 bpm HR, 92% SPO2   11/03/23 1139  Mobility  Activity Ambulated with assistance in hallway  Level of Assistance Contact guard assist, steadying assist  Assistive Device Front wheel walker  Distance Ambulated (ft) 200 ft  Range of Motion/Exercises Active  Activity Response Tolerated fair  Mobility Referral Yes  Mobility visit 1 Mobility  Mobility Specialist Start Time (ACUTE ONLY) 1110  Mobility Specialist Stop Time (ACUTE ONLY) 1139  Mobility Specialist Time Calculation (min) (ACUTE ONLY) 29 min   Pt was found in bed and agreeable to ambulat. Stated having BLE weakness and had x1 brief standing rest break due to SPO2 85%. Able to increase >90% within 1 min. At EOS returned to bed with all needs met. Call bell in reach and bed alarm on. MD in room.  Billey Chang Mobility Specialist

## 2023-11-03 NOTE — Hospital Course (Addendum)
 53 y.o. male with past medical history of tobacco abuse, alcohol abuse, hypertension, hyperlipidemia, GERD, gout, anxiety, C. difficile colitis history, panic attack, class II obesity, grade 2 diastolic dysfunction, asthma, COPD who underwent right robotic assisted laparoscopic partial nephrectomy and then a robotic plus laparoscopic repair of abdominal hernia with mesh, Tap block bilateral AND hospitalist consulted due to new oxygen requirement, AKI with electrolyte imbalance and.  Patient has been complaining of wheezing and dyspnea very easily  w/ no chest pains, palpitations, diaphoresis or recent lower extremity edema. he is still an active smoker.  He drinks half to a full 12 pack of beers daily.   Subjective: Seen this am C/o abdomen soreness no Flatus and BM- last BM 4/2 before surgery Overnight afebrile BP fluctuating 120s-170s remains on 6 L [place]. On walking he dropped to 85% for minute. Labs not ordered this morning I ordered them Denies chest pain, has cough  Assessment and plan:  Right renal neoplasm: S/p Right partial nephrectomy robotic assisted laparoscopic and hernia repair.  Postop plan per general surgery and nephrology, who are following closely. Continue wound care, monitor voids.  Acute kidney injury: Baseline creatinine <1.On admission 1.7 trending up,received IV fluids, recheck labs creat improving, cont ivf.Avoid NSAID/ARB. Recent Labs    04/15/23 1659 07/15/23 1633 10/29/23 1142 10/31/23 0644 10/31/23 1400 11/01/23 0355 11/02/23 1409 11/03/23 0844  BUN 5* 7 6 <5* 8 17 30* 35*  CREATININE 0.68* 0.78 0.76 0.43* 1.56* 1.74* 2.82* 1.87*  CO2 22 21 25  21* 23 24 23 24   K 4.6 5.2 5.3* 3.5 6.1* 5.9* 5.1 4.7    Acute hypoxic respiratory failure: Acute COPD/sthma w/ exacerbation Asthma Tobacco abuse: Patient with new oxygen requirement also w/ exp wheezing. Liklye from copd exacerbation, abdomen distension, r/o PE a home on Symbicort.cxr- moderate perihilar  peribronchial thickening bibasilar atelectasis.  Received Solu-Medrol 40 mg x 4/5.  Will add iv solumedrol, continue albuterol, Pulmicort add Yupelri/Brovana, continue supplemental oxygen BNP is normal and reassuring.  Tte was done 4/5 showed-ef 50-55%, basal infr/inferoseptal hypokinesia, RVSFA mildy reduced, RV size is mildly enlarged-compared to echo 1021 EF is 55 to 60% no RWMA G2 DD RV systolic function size was normal- ?? If he developed PE althoough has been hospitalized thus far 3 days-unable to get CTA due to his AKI-discussed with Urology ok for DVT prophylaxis- will get a stat VQ scan Duplex legs , troponins done and is negative.  Constipation: No flatus or BM. Check Xray abd no N/V. For suppository- plan for CCS  Chronic diastolic CHF: Volume depleted periop.  Chest x-ray no obvious pulmonary edema. Np normal at 90.Not on Lasix at home.  Hyponatremia: ?  Beer potomania vs dehydration.  Check urine electrolytes, repeat labs pending Recent Labs  Lab 10/31/23 0644 10/31/23 1400 11/01/23 0355 11/02/23 1409 11/03/23 0844  NA 125* 126* 127* 124* 128*    Alcohol abuse at risk of withdrawal: Admitted drinking 12 pack of beer daily.  Continue CIWA protocol, score remains low in zero this am.  Cont thiamine multivitamin and folate and alcohol cessation counseling  Tobacco abuse: Cessation advised  T2DM with uncontrolled hyperglycemia up to 250s: Hyperlipidemia: PTA on metformin, p.o. meds on hold .A1c stable 6.4. Continue SSI 0-15 units, Lipitor, and monitor CBG Recent Labs  Lab 10/31/23 1656 10/31/23 1957 11/02/23 1756 11/02/23 2000 11/03/23 0008 11/03/23 0423 11/03/23 0745  GLUCAP  --    < > 252* 233* 199* 164* 149*  HGBA1C 6.4*  --   --   --   --   --   --    < > =  values in this interval not displayed.    Essential hypertension PTA on amlodipine, metoprolol, valsartan> BP fairly stable on amlodipine and metoprolol.  Valsartan on hold due to AKI.   GERD: Continue  PPI daily  Chronic pain/neuropathy: Continue history BuSpar, Cymbalta, Neurontin

## 2023-11-03 NOTE — Progress Notes (Signed)
 3 Days Post-Op   Subjective/Chief Complaint: Still with some SOB when ambulating Abdominal pain less Tolerating po    Objective: Vital signs in last 24 hours: Temp:  [97.5 F (36.4 C)-98.4 F (36.9 C)] 97.5 F (36.4 C) (04/06 0400) Pulse Rate:  [80-101] 81 (04/06 0400) Resp:  [14-19] 14 (04/06 0400) BP: (96-172)/(64-90) 128/77 (04/06 0700) SpO2:  [90 %-98 %] 98 % (04/06 0400) Last BM Date : 10/30/23  Intake/Output from previous day: 04/05 0701 - 04/06 0700 In: 218.4 [I.V.:218.4] Out: 1150 [Urine:750; Stool:400] Intake/Output this shift: No intake/output data recorded.  Exam: Awake and alert No acute distress Eating breakfast Abdomen obese but soft, dressings dry  Lab Results:  Recent Labs    11/01/23 0355 11/02/23 1409  WBC  --  16.2*  HGB 12.6* 11.1*  HCT 38.3* 35.7*  PLT  --  228   BMET Recent Labs    11/01/23 0355 11/02/23 1409  NA 127* 124*  K 5.9* 5.1  CL 92* 89*  CO2 24 23  GLUCOSE 125* 158*  BUN 17 30*  CREATININE 1.74* 2.82*  CALCIUM 8.0* 8.4*   PT/INR No results for input(s): "LABPROT", "INR" in the last 72 hours. ABG No results for input(s): "PHART", "HCO3" in the last 72 hours.  Invalid input(s): "PCO2", "PO2"  Studies/Results: ECHOCARDIOGRAM COMPLETE Result Date: 11/02/2023    ECHOCARDIOGRAM REPORT   Patient Name:   GABOR LUSK Date of Exam: 11/02/2023 Medical Rec #:  [id number]       Height:       75.0 in Accession #:    [id number]      Weight:       297.6 lb Date of Birth:  05-13-1971       BSA:          2.598 m Patient Age:    52 years        BP:           112/70 mmHg Patient Gender: M               HR:           88 bpm. Exam Location:  Inpatient Procedure: 2D Echo, Cardiac Doppler, Color Doppler and Intracardiac            Opacification Agent (Both Spectral and Color Flow Doppler were            utilized during procedure). Indications:    R06.02 SOB; I50.40* Unspecified combined systolic (congestive)                 and diastolic  (congestive) heart failure  History:        Patient has prior history of Echocardiogram examinations, most                 recent 10/28/2019. CHF, Signs/Symptoms:Syncope; Risk                 Factors:Current Smoker, Dyslipidemia and Hypertension. ETOH.                 Cancer.  Sonographer:    Sheralyn Boatman RDCS Referring Phys: [id number] DAVID MANUEL ORTIZ  Sonographer Comments: Technically difficult study due to poor echo windows and patient is obese. Image acquisition challenging due to patient body habitus. IMPRESSIONS  1. Difficult acoustic windows limit study even with Definity used.  2. Difficult acoustic windows Definity used Basal inferior/inferoseptal hypokinesis. . Left ventricular ejection fraction, by estimation, is 50 to 55%. The left ventricle has low  normal function. The left ventricular internal cavity size was mildly dilated. Left ventricular diastolic parameters were normal.  3. Right ventricular systolic function is mildly reduced. The right ventricular size is mildly enlarged. Tricuspid regurgitation signal is inadequate for assessing PA pressure.  4. The mitral valve is grossly normal. Trivial mitral valve regurgitation.  5. The aortic valve is tricuspid. Aortic valve regurgitation is not visualized.  6. The inferior vena cava is normal in size with greater than 50% respiratory variability, suggesting right atrial pressure of 3 mmHg. FINDINGS  Left Ventricle: Difficult acoustic windows Definity used Basal inferior/inferoseptal hypokinesis. Left ventricular ejection fraction, by estimation, is 50 to 55%. The left ventricle has low normal function. Definity contrast agent was given IV to delineate the left ventricular endocardial borders. The left ventricular internal cavity size was mildly dilated. There is no left ventricular hypertrophy. Left ventricular diastolic parameters were normal. Right Ventricle: The right ventricular size is mildly enlarged. Right vetricular wall thickness was not assessed.  Right ventricular systolic function is mildly reduced. Tricuspid regurgitation signal is inadequate for assessing PA pressure. Left Atrium: Left atrial size was normal in size. Right Atrium: Right atrial size was normal in size. Pericardium: There is no evidence of pericardial effusion. Mitral Valve: The mitral valve is grossly normal. Trivial mitral valve regurgitation. Tricuspid Valve: The tricuspid valve is normal in structure. Tricuspid valve regurgitation is trivial. Aortic Valve: The aortic valve is tricuspid. Aortic valve regurgitation is not visualized. Pulmonic Valve: The pulmonic valve was normal in structure. Pulmonic valve regurgitation is not visualized. Aorta: The aortic root and ascending aorta are structurally normal, with no evidence of dilitation. Venous: The inferior vena cava is normal in size with greater than 50% respiratory variability, suggesting right atrial pressure of 3 mmHg. IAS/Shunts: No atrial level shunt detected by color flow Doppler.  LEFT VENTRICLE PLAX 2D LVIDd:         5.60 cm      Diastology LVIDs:         4.10 cm      LV e' medial:    7.29 cm/s LV PW:         1.00 cm      LV E/e' medial:  10.1 LV IVS:        1.00 cm      LV e' lateral:   10.20 cm/s                             LV E/e' lateral: 7.2  LV Volumes (MOD) LV vol d, MOD A2C: 121.0 ml LV vol d, MOD A4C: 143.0 ml LV vol s, MOD A2C: 59.1 ml LV vol s, MOD A4C: 67.3 ml LV SV MOD A2C:     61.9 ml LV SV MOD A4C:     143.0 ml LV SV MOD BP:      67.5 ml RIGHT VENTRICLE             IVC RV S prime:     16.10 cm/s  IVC diam: 1.40 cm TAPSE (M-mode): 1.9 cm LEFT ATRIUM             Index        RIGHT ATRIUM           Index LA diam:        4.10 cm 1.58 cm/m   RA Area:     20.30 cm LA Vol (A2C):   41.9 ml 16.13 ml/m  RA Volume:   69.90 ml  26.90 ml/m LA Vol (A4C):   43.4 ml 16.70 ml/m LA Biplane Vol: 44.4 ml 17.09 ml/m  AORTIC VALVE LVOT Vmax:   104.00 cm/s LVOT Vmean:  68.800 cm/s LVOT VTI:    0.159 m  AORTA Ao Root diam: 3.80 cm  Ao Asc diam:  3.90 cm MITRAL VALVE MV Area (PHT): 3.84 cm    SHUNTS MV Decel Time: 198 msec    Systemic VTI: 0.16 m MV E velocity: 73.80 cm/s MV A velocity: 72.85 cm/s MV E/A ratio:  1.01 Dietrich Pates MD Electronically signed by Dietrich Pates MD Signature Date/Time: 11/02/2023/7:50:48 PM    Final    DG Chest 1 View Result Date: 11/02/2023 CLINICAL DATA:  Hypoxia.  Recent nephrectomy. EXAM: CHEST  1 VIEW COMPARISON:  12/16/2020 FINDINGS: Stable heart size and mediastinal contours. Streaky opacities in the lung bases typical of atelectasis. There is moderate perihilar peribronchial thickening. No pneumothorax or significant pleural effusion. On limited assessment, no acute osseous findings. IMPRESSION: 1. Moderate perihilar peribronchial thickening, can be seen with bronchitis or reactive airways disease. 2. Bibasilar atelectasis. Electronically Signed   By: Narda Rutherford M.D.   On: 11/02/2023 17:07    Anti-infectives: Anti-infectives (From admission, onward)    Start     Dose/Rate Route Frequency Ordered Stop   10/31/23 2000  ceFAZolin (ANCEF) IVPB 1 g/50 mL premix        1 g 100 mL/hr over 30 Minutes Intravenous Every 8 hours 10/31/23 1606 11/01/23 0449   10/31/23 0600  ceFAZolin (ANCEF) IVPB 2g/100 mL premix  Status:  Discontinued        2 g 200 mL/hr over 30 Minutes Intravenous On call to O.R. 10/31/23 0536 10/31/23 0538   10/31/23 0536  ceFAZolin (ANCEF) IVPB 2g/100 mL premix        2 g 200 mL/hr over 30 Minutes Intravenous 30 min pre-op 10/31/23 0536 10/31/23 1127       Assessment/Plan: s/p Procedure(s) with comments: XI RIGHT ROBOTIC ASSISTED LAPAROSCOPIC PARTIAL NEPHRECTOMY (Right) - 150 MINUTES NEEDED FOR CASE ROBOTIC + LAPAROSCOPIC REPAIR OF ABDOMINAL HERNIA WITH MESH, TAP BLOCK - BILATERAL (N/A)  Appreciate Hospitalist's input Continue O2, IS Ambulate   LOS: 1 day    Abigail Miyamoto MD 11/03/2023

## 2023-11-03 NOTE — Progress Notes (Signed)
 BLE venous duplex has been completed.     Results can be found under chart review under CV PROC. 11/03/2023 6:25 PM Manan Olmo RVT, RDMS

## 2023-11-03 NOTE — Progress Notes (Signed)
 Urology Inpatient Progress Report  Neoplasm of right kidney [D49.511]  Procedure(s): XI RIGHT ROBOTIC ASSISTED LAPAROSCOPIC PARTIAL NEPHRECTOMY ROBOTIC + LAPAROSCOPIC REPAIR OF ABDOMINAL HERNIA WITH MESH, TAP BLOCK - BILATERAL  3 Days Post-Op   Intv/Subj: Patient seen by hospitalist service yesterday and being treated for COPD exacerbation.  Was given 1 L bolus for acute renal insufficiency as well.  Creatinine did improve some today but still with mild AKI.  Continues to require supplemental oxygen.  Planning to obtain VQ scan to rule out pulmonary embolus.  Wanted to avoid IV contrast with CTA given recent AKI  Principal Problem:   Neoplasm of right kidney Active Problems:   Tobacco abuse   HLD (hyperlipidemia)   Alcohol abuse   Chronic diastolic congestive heart failure (HCC)   Gastroesophageal reflux disease without esophagitis   Type 2 diabetes mellitus treated without insulin (HCC)   Essential hypertension   Acute kidney injury (HCC)   Obesity, Class II, BMI 35-39.9   Diastasis recti   Incarcerated incisional hernia s/p repair w mesh 10/31/2023   History of Left Renal cell cancer   Hyponatremia   Acute respiratory failure with hypoxia (HCC)  Current Facility-Administered Medications  Medication Dose Route Frequency Provider Last Rate Last Admin   0.9 %  sodium chloride infusion  250 mL Intravenous PRN Karie Soda, MD       albuterol (PROVENTIL) (2.5 MG/3ML) 0.083% nebulizer solution 2.5 mg  2.5 mg Nebulization Q6H PRN Heloise Purpura, MD   2.5 mg at 11/01/23 1012   albuterol (PROVENTIL) (2.5 MG/3ML) 0.083% nebulizer solution 2.5 mg  2.5 mg Nebulization Once Bobette Mo, MD       allopurinol (ZYLOPRIM) tablet 300 mg  300 mg Oral Daily Heloise Purpura, MD   300 mg at 11/02/23 1020   amLODipine (NORVASC) tablet 10 mg  10 mg Oral Anner Crete, MD   10 mg at 11/02/23 2042   atorvastatin (LIPITOR) tablet 40 mg  40 mg Oral QPM Heloise Purpura, MD   40 mg at 11/02/23  1800   bisacodyl (DULCOLAX) suppository 10 mg  10 mg Rectal Daily Karie Soda, MD       budesonide (PULMICORT) nebulizer solution 0.5 mg  0.5 mg Nebulization BID Bobette Mo, MD   0.5 mg at 11/03/23 0858   busPIRone (BUSPAR) tablet 15 mg  15 mg Oral BID Heloise Purpura, MD   15 mg at 11/02/23 2041   cyclobenzaprine (FLEXERIL) tablet 5 mg  5 mg Oral TID PRN Heloise Purpura, MD       diphenhydrAMINE (BENADRYL) injection 12.5-25 mg  12.5-25 mg Intravenous Q6H PRN Heloise Purpura, MD       Or   diphenhydrAMINE (BENADRYL) 12.5 MG/5ML elixir 12.5-25 mg  12.5-25 mg Oral Q6H PRN Heloise Purpura, MD   25 mg at 11/01/23 0419   DULoxetine (CYMBALTA) DR capsule 60 mg  60 mg Oral Anner Crete, MD   60 mg at 11/02/23 2042   enoxaparin (LOVENOX) injection 40 mg  40 mg Subcutaneous Q24H Kc, Ramesh, MD       folic acid (FOLVITE) tablet 1 mg  1 mg Oral Daily Bobette Mo, MD   1 mg at 11/02/23 1619   gabapentin (NEURONTIN) capsule 900 mg  900 mg Oral BID Heloise Purpura, MD   900 mg at 11/02/23 2042   guaiFENesin-dextromethorphan (ROBITUSSIN DM) 100-10 MG/5ML syrup 15 mL  15 mL Oral Q4H PRN Karie Soda, MD       HYDROmorphone (DILAUDID)  injection 1 mg  1 mg Intravenous Q4H PRN Bobette Mo, MD       insulin aspart (novoLOG) injection 0-15 Units  0-15 Units Subcutaneous Q4H Heloise Purpura, MD   2 Units at 11/03/23 6578   ipratropium-albuterol (DUONEB) 0.5-2.5 (3) MG/3ML nebulizer solution 3 mL  3 mL Nebulization QID Bobette Mo, MD   3 mL at 11/03/23 4696   lactated ringers infusion   Intravenous Continuous Bobette Mo, MD 150 mL/hr at 11/02/23 1739 New Bag at 11/02/23 1739   LORazepam (ATIVAN) tablet 1-4 mg  1-4 mg Oral Q1H PRN Bobette Mo, MD   1 mg at 11/02/23 1759   Or   LORazepam (ATIVAN) injection 1-4 mg  1-4 mg Intravenous Q1H PRN Bobette Mo, MD   2 mg at 11/03/23 0421   LORazepam (ATIVAN) tablet 0-4 mg  0-4 mg Oral Q6H Bobette Mo, MD   1  mg at 11/02/23 1619   Followed by   Melene Muller ON 11/04/2023] LORazepam (ATIVAN) tablet 0-4 mg  0-4 mg Oral Q12H Bobette Mo, MD       magic mouthwash  15 mL Oral QID PRN Karie Soda, MD       menthol-cetylpyridinium (CEPACOL) lozenge 3 mg  1 lozenge Oral PRN Karie Soda, MD       metoprolol tartrate (LOPRESSOR) tablet 50 mg  50 mg Oral BID Heloise Purpura, MD   50 mg at 11/02/23 2042   multivitamin with minerals tablet 1 tablet  1 tablet Oral Daily Bobette Mo, MD   1 tablet at 11/02/23 1619   naphazoline-glycerin (CLEAR EYES REDNESS) ophth solution 1-2 drop  1-2 drop Both Eyes QID PRN Karie Soda, MD       nicotine (NICODERM CQ - dosed in mg/24 hours) patch 21 mg  21 mg Transdermal Daily Crosley, Debby, MD   21 mg at 11/02/23 2057   ondansetron (ZOFRAN) injection 4 mg  4 mg Intravenous Q4H PRN Heloise Purpura, MD       Oral care mouth rinse  15 mL Mouth Rinse PRN Heloise Purpura, MD       oxyCODONE-acetaminophen (PERCOCET/ROXICET) 5-325 MG per tablet 1-2 tablet  1-2 tablet Oral Q6H PRN Heloise Purpura, MD   2 tablet at 11/02/23 1351   pantoprazole (PROTONIX) EC tablet 80 mg  80 mg Oral Daily Heloise Purpura, MD   80 mg at 11/02/23 1020   phenol (CHLORASEPTIC) mouth spray 2 spray  2 spray Mouth/Throat PRN Karie Soda, MD       polycarbophil (FIBERCON) tablet 625 mg  625 mg Oral BID Karie Soda, MD   625 mg at 11/02/23 2042   sodium chloride (OCEAN) 0.65 % nasal spray 1-2 spray  1-2 spray Each Nare Q6H PRN Karie Soda, MD       sodium chloride flush (NS) 0.9 % injection 3 mL  3 mL Intravenous Catha Gosselin, MD   3 mL at 11/02/23 2039   sodium chloride flush (NS) 0.9 % injection 3 mL  3 mL Intravenous PRN Karie Soda, MD       thiamine (VITAMIN B1) tablet 100 mg  100 mg Oral Daily Bobette Mo, MD   100 mg at 11/02/23 1619   Or   thiamine (VITAMIN B1) injection 100 mg  100 mg Intravenous Daily Bobette Mo, MD         Objective: Vital: Vitals:    11/03/23 0400 11/03/23 0617 11/03/23 0700 11/03/23 0857  BP: [name])  141/90 (!) 172/84 128/77   Pulse: 81     Resp: 14     Temp: (!) 97.5 F (36.4 C)     TempSrc: Oral     SpO2: 98%   95%  Weight:      Height:       I/Os: I/O last 3 completed shifts: In: 218.4 [I.V.:218.4] Out: 1150 [Urine:750; Stool:400]  Physical Exam:  General: Patient is in no apparent distress Lungs: Normal respiratory effort, chest expands symmetrically. GI: Incisions are c/d/i.  Obese.  The abdomen is soft and nontender without mass.  Some ecchymosis Ext: lower extremities symmetric  Lab Results: Recent Labs    11/01/23 0355 11/02/23 1409 11/03/23 0844  WBC  --  16.2* 16.5*  HGB 12.6* 11.1* 10.4*  HCT 38.3* 35.7* 32.3*   Recent Labs    11/01/23 0355 11/02/23 1409 11/03/23 0844  NA 127* 124* 128*  K 5.9* 5.1 4.7  CL 92* 89* 94*  CO2 24 23 24   GLUCOSE 125* 158* 151*  BUN 17 30* 35*  CREATININE 1.74* 2.82* 1.87*  CALCIUM 8.0* 8.4* 8.5*   No results for input(s): "LABPT", "INR" in the last 72 hours. No results for input(s): "LABURIN" in the last 72 hours. Results for orders placed or performed in visit on 08/23/22  TIQ-MISC     Status: None   Collection Time: 08/23/22 12:06 PM  Result Value Ref Range Status   QUESTION/PROBLEM:   Final    Comment: . There is a question regarding the following specimen submitted and/or the test requested. .    QUESTION: VERIFY NAME  Final    Comment: REQUESTED INFORMATION _________________________________ . AUTHORIZED SIGNATURE __________________________________ . TO PREVENT FURTHER DELAYS IN TESTING, PLEASE COMPLETE INFORMATION ABOVE AND EITHER FAX TO [phone] OR  EMAIL TO [email or web address] TO  RESOLVE THIS ORDER.     Studies/Results: ECHOCARDIOGRAM COMPLETE Result Date: 11/02/2023    ECHOCARDIOGRAM REPORT   Patient Name:   Preston [name] Date of Exam: 11/02/2023 Medical Rec #:  [id number]       Height:       75.0 in Accession  #:    [id number]      Weight:       297.6 lb Date of Birth:  1971-02-07       BSA:          2.598 m Patient Age:    52 years        BP:           112/70 mmHg Patient Gender: M               HR:           88 bpm. Exam Location:  Inpatient Procedure: 2D Echo, Cardiac Doppler, Color Doppler and Intracardiac            Opacification Agent (Both Spectral and Color Flow Doppler were            utilized during procedure). 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 CHF, Signs/Symptoms:Syncope; Risk                 Factors:Current Smoker, Dyslipidemia and  Hypertension. ETOH.                 Cancer.  Sonographer:    Sheralyn Boatman RDCS Referring Phys: [id number] DAVID MANUEL ORTIZ  Sonographer Comments: Technically difficult study due to poor echo windows and patient is obese. Image acquisition challenging due to patient body habitus. IMPRESSIONS  1. Difficult acoustic windows limit study even with Definity used.  2. Difficult acoustic windows Definity used Basal inferior/inferoseptal hypokinesis. . Left ventricular ejection fraction, by estimation, is 50 to 55%. The left ventricle has low normal function. The left ventricular internal cavity size was mildly dilated. Left ventricular diastolic parameters were normal.  3. Right ventricular systolic function is mildly reduced. The right ventricular size is mildly enlarged. Tricuspid regurgitation signal is inadequate for assessing PA pressure.  4. The mitral valve is grossly normal. Trivial mitral valve regurgitation.  5. The aortic valve is tricuspid. Aortic valve regurgitation is not visualized.  6. The inferior vena cava is normal in size with greater than 50% respiratory variability, suggesting right atrial pressure of 3 mmHg. FINDINGS  Left Ventricle: Difficult acoustic windows  Definity used Basal inferior/inferoseptal hypokinesis. Left ventricular ejection fraction, by estimation, is 50 to 55%. The left ventricle has low normal function. Definity contrast agent was given IV to delineate the left ventricular endocardial borders. The left ventricular internal cavity size was mildly dilated. There is no left ventricular hypertrophy. Left ventricular diastolic parameters were normal. Right Ventricle: The right ventricular size is mildly enlarged. Right vetricular wall thickness was not assessed. Right ventricular systolic function is mildly reduced. Tricuspid regurgitation signal is inadequate for assessing PA pressure. Left Atrium: Left atrial size was normal in size. Right Atrium: Right atrial size was normal in size. Pericardium: There is no evidence of pericardial effusion. Mitral Valve: The mitral valve is grossly normal. Trivial mitral valve regurgitation. Tricuspid Valve: The tricuspid valve is normal in structure. Tricuspid valve regurgitation is trivial. Aortic Valve: The aortic valve is tricuspid. Aortic valve regurgitation is not visualized. Pulmonic Valve: The pulmonic valve was normal in structure. Pulmonic valve regurgitation is not visualized. Aorta: The aortic root and ascending aorta are structurally normal, with no evidence of dilitation. Venous: The inferior vena cava is normal in size with greater than 50% respiratory variability, suggesting right atrial pressure of 3 mmHg. IAS/Shunts: No atrial level shunt detected by color flow Doppler.  LEFT VENTRICLE PLAX 2D LVIDd:         5.60 cm      Diastology LVIDs:         4.10 cm      LV e' medial:    7.29 cm/s LV PW:         1.00 cm      LV E/e' medial:  10.1 LV IVS:        1.00 cm      LV e' lateral:   10.20 cm/s                             LV E/e' lateral: 7.2  LV Volumes (MOD) LV vol d, MOD A2C: 121.0 ml LV vol d, MOD A4C: 143.0 ml LV vol s, MOD A2C: 59.1 ml LV vol s, MOD A4C: 67.3 ml LV SV MOD A2C:     61.9 ml LV SV MOD A4C:      143.0 ml LV SV MOD BP:      67.5 ml RIGHT VENTRICLE  IVC RV S prime:     16.10 cm/s  IVC diam: 1.40 cm TAPSE (M-mode): 1.9 cm LEFT ATRIUM             Index        RIGHT ATRIUM           Index LA diam:        4.10 cm 1.58 cm/m   RA Area:     20.30 cm LA Vol (A2C):   41.9 ml 16.13 ml/m  RA Volume:   69.90 ml  26.90 ml/m LA Vol (A4C):   43.4 ml 16.70 ml/m LA Biplane Vol: 44.4 ml 17.09 ml/m  AORTIC VALVE LVOT Vmax:   104.00 cm/s LVOT Vmean:  68.800 cm/s LVOT VTI:    0.159 m  AORTA Ao Root diam: 3.80 cm Ao Asc diam:  3.90 cm MITRAL VALVE MV Area (PHT): 3.84 cm    SHUNTS MV Decel Time: 198 msec    Systemic VTI: 0.16 m MV E velocity: 73.80 cm/s MV A velocity: 72.85 cm/s MV E/A ratio:  1.01 Dietrich Pates MD Electronically signed by Dietrich Pates MD Signature Date/Time: 11/02/2023/7:50:48 PM    Final    DG Chest 1 View Result Date: 11/02/2023 CLINICAL DATA:  Hypoxia.  Recent nephrectomy. EXAM: CHEST  1 VIEW COMPARISON:  12/16/2020 FINDINGS: Stable heart size and mediastinal contours. Streaky opacities in the lung bases typical of atelectasis. There is moderate perihilar peribronchial thickening. No pneumothorax or significant pleural effusion. On limited assessment, no acute osseous findings. IMPRESSION: 1. Moderate perihilar peribronchial thickening, can be seen with bronchitis or reactive airways disease. 2. Bibasilar atelectasis. Electronically Signed   By: Narda Rutherford M.D.   On: 11/02/2023 17:07    Assessment: Right renal mass Hypoxia Acute renal insufficiency, improving  Procedure(s): XI RIGHT ROBOTIC ASSISTED LAPAROSCOPIC PARTIAL NEPHRECTOMY ROBOTIC + LAPAROSCOPIC REPAIR OF ABDOMINAL HERNIA WITH MESH, TAP BLOCK - BILATERAL, 3 Days Post-Op  doing well.  Plan: Appreciate hospitalist service evaluation management of hypoxia/COPD exacerbation.  Continue management as per their recommendations.  Stable from a surgical standpoint.  Lovenox started for DVT prophylaxis.   Modena Slater,  MD Urology 11/03/2023, 10:26 AM

## 2023-11-03 NOTE — Progress Notes (Signed)
 PROGRESS NOTE Preston [name]  UJW:[id number] DOB: 11/07/1970 DOA: 10/31/2023 PCP: Claiborne Rigg, NP  Brief Narrative/Hospital Course: 53 y.o. male with past medical history of tobacco abuse, alcohol abuse, hypertension, hyperlipidemia, GERD, gout, anxiety, C. difficile colitis history, panic attack, class II obesity, grade 2 diastolic dysfunction, asthma, COPD who underwent right robotic assisted laparoscopic partial nephrectomy and then a robotic plus laparoscopic repair of abdominal hernia with mesh, Tap block bilateral AND hospitalist consulted due to new oxygen requirement, AKI with electrolyte imbalance and.  Patient has been complaining of wheezing and dyspnea very easily  w/ no chest pains, palpitations, diaphoresis or recent lower extremity edema. he is still an active smoker.  He drinks half to a full 12 pack of beers daily.   Subjective: Seen this am C/o abdomen soreness no Flatus and BM- last BM 4/2 before surgery Overnight afebrile BP fluctuating 120s-170s remains on 6 L [place]. On walking he dropped to 85% for minute. Labs not ordered this morning I ordered them Denies chest pain, has cough  Assessment and plan:  Right renal neoplasm: S/p Right partial nephrectomy robotic assisted laparoscopic and hernia repair.  Postop plan per general surgery and nephrology, who are following closely. Continue wound care, monitor voids.  Acute kidney injury: Baseline creatinine <1.On admission 1.7 trending up,received IV fluids, recheck labs creat improving, cont ivf.Avoid NSAID/ARB. Recent Labs    04/15/23 1659 07/15/23 1633 10/29/23 1142 10/31/23 0644 10/31/23 1400 11/01/23 0355 11/02/23 1409 11/03/23 0844  BUN 5* 7 6 <5* 8 17 30* 35*  CREATININE 0.68* 0.78 0.76 0.43* 1.56* 1.74* 2.82* 1.87*  CO2 22 21 25  21* 23 24 23 24   K 4.6 5.2 5.3* 3.5 6.1* 5.9* 5.1 4.7    Acute hypoxic respiratory failure: Acute COPD/sthma w/ exacerbation Asthma Tobacco abuse: Patient with new oxygen  requirement also w/ exp wheezing. Liklye from copd exacerbation, abdomen distension, r/o PE a home on Symbicort.cxr- moderate perihilar peribronchial thickening bibasilar atelectasis.  Received Solu-Medrol 40 mg x 4/5.  Will add iv solumedrol, continue albuterol, Pulmicort add Yupelri/Brovana, continue supplemental oxygen BNP is normal and reassuring.  Tte was done 4/5 showed-ef 50-55%, basal infr/inferoseptal hypokinesia, RVSFA mildy reduced, RV size is mildly enlarged-compared to echo 1021 EF is 55 to 60% no RWMA G2 DD RV systolic function size was normal- ?? If he developed PE althoough has been hospitalized thus far 3 days-unable to get CTA due to his AKI-discussed with Urology ok for DVT prophylaxis- will get a stat VQ scan Duplex legs , troponins done and is negative.  Constipation: No flatus or BM. Check Xray abd no N/V. For suppository- plan for CCS  Chronic diastolic CHF: Volume depleted periop.  Chest x-ray no obvious pulmonary edema. Np normal at 90.Not on Lasix at home.  Hyponatremia: ?  Beer potomania vs dehydration.  Check urine electrolytes, repeat labs pending Recent Labs  Lab 10/31/23 0644 10/31/23 1400 11/01/23 0355 11/02/23 1409 11/03/23 0844  NA 125* 126* 127* 124* 128*    Alcohol abuse at risk of withdrawal: Admitted drinking 12 pack of beer daily.  Continue CIWA protocol, score remains low in zero this am.  Cont thiamine multivitamin and folate and alcohol cessation counseling  Tobacco abuse: Cessation advised  T2DM with uncontrolled hyperglycemia up to 250s: Hyperlipidemia: PTA on metformin, p.o. meds on hold .A1c stable 6.4. Continue SSI 0-15 units, Lipitor, and monitor CBG Recent Labs  Lab 10/31/23 1656 10/31/23 1957 11/02/23 1756 11/02/23 2000 11/03/23 0008 11/03/23 0423 11/03/23 0745  GLUCAP  --    < >  252* 233* 199* 164* 149*  HGBA1C 6.4*  --   --   --   --   --   --    < > = values in this interval not displayed.    Essential hypertension PTA  on amlodipine, metoprolol, valsartan> BP fairly stable on amlodipine and metoprolol.  Valsartan on hold due to AKI.   GERD: Continue PPI daily  Chronic pain/neuropathy: Continue history BuSpar, Cymbalta, Neurontin    Class I Obesity w/ Body mass index is 37.2 kg/m.: Will benefit with PCP follow-up, weight loss,healthy lifestyle and outpatient sleep eval if not done.   DVT prophylaxis: enoxaparin (LOVENOX) injection 40 mg Start: 11/03/23 1000 SCDs Start: 10/31/23 1607 Code Status:   Code Status: Full Code Family Communication: plan of care discussed with patient at bedside. Patient status is: Remains hospitalized because of severity of illness Level of care: Progressive   Dispo: The patient is from: home with  girlfriend            Anticipated disposition: TBD  Objective: Vitals last 24 hrs: Vitals:   11/03/23 0400 11/03/23 0617 11/03/23 0700 11/03/23 0857  BP: (!) 141/90 (!) 172/84 128/77   Pulse: 81     Resp: 14     Temp: (!) 97.5 F (36.4 C)     TempSrc: Oral     SpO2: 98%   95%  Weight:      Height:       Weight change:   Physical Examination: General exam: alert awake, older than stated age HEENT:Oral mucosa moist, Ear/Nose WNL grossly Respiratory system: Bilaterally diminished BS, no use of accessory muscle Cardiovascular system: S1 & S2 +. Gastrointestinal system: Abdomen soft, mildly tender surgical sites c/d/d, Distende,BS sluggish Nervous System: Alert, awake,following commands. Extremities: LE edema neg, moving arms, warm legs Skin: No rashes,warm. MSK: Normal muscle bulk/tone.   Medications reviewed:  Scheduled Meds:  albuterol  2.5 mg Nebulization Once   allopurinol  300 mg Oral Daily   amLODipine  10 mg Oral QHS   arformoterol  15 mcg Nebulization BID   atorvastatin  40 mg Oral QPM   bisacodyl  10 mg Rectal Daily   budesonide (PULMICORT) nebulizer solution  0.5 mg Nebulization BID   busPIRone  15 mg Oral BID   DULoxetine  60 mg Oral QHS    enoxaparin (LOVENOX) injection  40 mg Subcutaneous Q24H   folic acid  1 mg Oral Daily   gabapentin  900 mg Oral BID   insulin aspart  0-15 Units Subcutaneous Q4H   LORazepam  0-4 mg Oral Q6H   Followed by   Melene Muller ON 11/04/2023] LORazepam  0-4 mg Oral Q12H   methylPREDNISolone (SOLU-MEDROL) injection  60 mg Intravenous Q12H   metoprolol tartrate  50 mg Oral BID   multivitamin with minerals  1 tablet Oral Daily   nicotine  21 mg Transdermal Daily   pantoprazole  80 mg Oral Daily   polycarbophil  625 mg Oral BID   revefenacin  175 mcg Nebulization Daily   sodium chloride flush  3 mL Intravenous Q12H   thiamine  100 mg Oral Daily   Or   thiamine  100 mg Intravenous Daily   Continuous Infusions:  sodium chloride     lactated ringers 150 mL/hr at 11/02/23 1739   lactated ringers      Diet Order             DIET SOFT Room service appropriate? Yes; Fluid consistency: Thin; Fluid restriction:  1200 mL Fluid  Diet effective now           Diet - low sodium heart healthy                    Intake/Output Summary (Last 24 hours) at 11/03/2023 1152 Last data filed at 11/03/2023 1028 Gross per 24 hour  Intake 341.36 ml  Output 400 ml  Net -58.64 ml   Net IO Since Admission: 2,189.56 mL [11/03/23 1152]  Wt Readings from Last 3 Encounters:  10/31/23 135 kg  10/29/23 135 kg  10/14/23 135.2 kg     Unresulted Labs (From admission, onward)     Start     Ordered   11/04/23 0500  Basic metabolic panel with GFR  Daily,   R      11/03/23 0837   11/04/23 0500  CBC  Daily,   R      11/03/23 0837   11/02/23 1513  Sodium, urine, random  Once,   R        11/02/23 1512   11/02/23 1513  Osmolality, urine  Once,   R        11/02/23 1512          Data Reviewed: I have personally reviewed following labs and imaging studies ( see epic result tab) CBC: Recent Labs  Lab 10/29/23 1142 10/31/23 1400 11/01/23 0355 11/02/23 1409 11/03/23 0844  WBC 11.5*  --   --  16.2* 16.5*  HGB 16.5  14.8 12.6* 11.1* 10.4*  HCT 49.1 45.2 38.3* 35.7* 32.3*  MCV 94.4  --   --  102.0* 97.6  PLT 282  --   --  228 224   CMP: Recent Labs  Lab 10/31/23 0644 10/31/23 1400 11/01/23 0355 11/02/23 1409 11/03/23 0844  NA 125* 126* 127* 124* 128*  K 3.5 6.1* 5.9* 5.1 4.7  CL 89* 90* 92* 89* 94*  CO2 21* 23 24 23 24   GLUCOSE 120* 229* 125* 158* 151*  BUN <5* 8 17 30* 35*  CREATININE 0.43* 1.56* 1.74* 2.82* 1.87*  CALCIUM 9.1 8.4* 8.0* 8.4* 8.5*  MG  --   --   --  2.5*  --   PHOS  --   --   --  4.0  --    GFR: Estimated Creatinine Clearance: 69.2 mL/min (A) (by C-G formula based on SCr of 1.87 mg/dL (H)). Recent Labs  Lab 11/02/23 1409  AST 64*  ALT 26  ALKPHOS 59  BILITOT 0.7  PROT 6.9  ALBUMIN 3.5   Recent Labs    10/31/23 1656  HGBA1C 6.4*   Recent Labs  Lab 11/02/23 1756 11/02/23 2000 11/03/23 0008 11/03/23 0423 11/03/23 0745  GLUCAP 252* 233* 199* 164* 149*  Antimicrobials/Microbiology: Anti-infectives (From admission, onward)    Start     Dose/Rate Route Frequency Ordered Stop   10/31/23 2000  ceFAZolin (ANCEF) IVPB 1 g/50 mL premix        1 g 100 mL/hr over 30 Minutes Intravenous Every 8 hours 10/31/23 1606 11/01/23 0449   10/31/23 0600  ceFAZolin (ANCEF) IVPB 2g/100 mL premix  Status:  Discontinued        2 g 200 mL/hr over 30 Minutes Intravenous On call to O.R. 10/31/23 0536 10/31/23 0538   10/31/23 0536  ceFAZolin (ANCEF) IVPB 2g/100 mL premix        2 g 200 mL/hr over 30 Minutes Intravenous 30 min pre-op 10/31/23 0536 10/31/23 1127  Component Value Date/Time   SDES NOSE 12/12/2016 1737   SPECREQUEST NONE 12/12/2016 1737   CULT NO MRSA DETECTED 12/12/2016 1737   REPTSTATUS 12/15/2016 FINAL 12/12/2016 1737     Radiology Studies: ECHOCARDIOGRAM COMPLETE Result Date: 11/02/2023    ECHOCARDIOGRAM REPORT   Patient Name:   Preston [name] Date of Exam: 11/02/2023 Medical Rec #:  [id number]       Height:       75.0 in Accession #:    [id number]       Weight:       297.6 lb Date of Birth:  16-Feb-1971       BSA:          2.598 m Patient Age:    52 years        BP:           112/70 mmHg Patient Gender: M               HR:           88 bpm. Exam Location:  Inpatient Procedure: 2D Echo, Cardiac Doppler, Color Doppler and Intracardiac            Opacification Agent (Both Spectral and Color Flow Doppler were            utilized during procedure). 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 CHF, Signs/Symptoms:Syncope; Risk                 Factors:Current Smoker, Dyslipidemia and Hypertension. ETOH.                 Cancer.  Sonographer:    Sheralyn Boatman RDCS Referring Phys: [id number] DAVID MANUEL ORTIZ  Sonographer Comments: Technically difficult study due to poor echo windows and patient is obese. Image acquisition challenging due to patient body habitus. IMPRESSIONS  1. Difficult acoustic windows limit study even with Definity used.  2. Difficult acoustic windows Definity used Basal inferior/inferoseptal hypokinesis. . Left ventricular ejection fraction, by estimation, is 50 to 55%. The left ventricle has low normal function. The left ventricular internal cavity size was mildly dilated. Left ventricular diastolic parameters were normal.  3. Right ventricular systolic function is mildly reduced. The right ventricular size is mildly enlarged. Tricuspid regurgitation signal is inadequate for assessing PA pressure.  4. The mitral valve is grossly normal. Trivial mitral valve regurgitation.  5. The aortic valve is tricuspid. Aortic valve regurgitation is not visualized.  6. The inferior vena cava is normal in size with greater than 50% respiratory variability, suggesting right atrial pressure of 3 mmHg. FINDINGS  Left Ventricle: Difficult acoustic windows Definity used Basal  inferior/inferoseptal hypokinesis. Left ventricular ejection fraction, by estimation, is 50 to 55%. The left ventricle has low normal function. Definity contrast agent was given IV to delineate the left ventricular endocardial borders. The left ventricular internal cavity size was mildly dilated. There is no left ventricular hypertrophy. Left ventricular diastolic parameters were normal. Right Ventricle: The right ventricular size is mildly enlarged. Right vetricular wall thickness was not assessed. Right ventricular systolic function is mildly reduced. Tricuspid regurgitation signal is inadequate for assessing PA pressure. Left Atrium: Left  atrial size was normal in size. Right Atrium: Right atrial size was normal in size. Pericardium: There is no evidence of pericardial effusion. Mitral Valve: The mitral valve is grossly normal. Trivial mitral valve regurgitation. Tricuspid Valve: The tricuspid valve is normal in structure. Tricuspid valve regurgitation is trivial. Aortic Valve: The aortic valve is tricuspid. Aortic valve regurgitation is not visualized. Pulmonic Valve: The pulmonic valve was normal in structure. Pulmonic valve regurgitation is not visualized. Aorta: The aortic root and ascending aorta are structurally normal, with no evidence of dilitation. Venous: The inferior vena cava is normal in size with greater than 50% respiratory variability, suggesting right atrial pressure of 3 mmHg. IAS/Shunts: No atrial level shunt detected by color flow Doppler.  LEFT VENTRICLE PLAX 2D LVIDd:         5.60 cm      Diastology LVIDs:         4.10 cm      LV e' medial:    7.29 cm/s LV PW:         1.00 cm      LV E/e' medial:  10.1 LV IVS:        1.00 cm      LV e' lateral:   10.20 cm/s                             LV E/e' lateral: 7.2  LV Volumes (MOD) LV vol d, MOD A2C: 121.0 ml LV vol d, MOD A4C: 143.0 ml LV vol s, MOD A2C: 59.1 ml LV vol s, MOD A4C: 67.3 ml LV SV MOD A2C:     61.9 ml LV SV MOD A4C:     143.0 ml LV SV  MOD BP:      67.5 ml RIGHT VENTRICLE             IVC RV S prime:     16.10 cm/s  IVC diam: 1.40 cm TAPSE (M-mode): 1.9 cm LEFT ATRIUM             Index        RIGHT ATRIUM           Index LA diam:        4.10 cm 1.58 cm/m   RA Area:     20.30 cm LA Vol (A2C):   41.9 ml 16.13 ml/m  RA Volume:   69.90 ml  26.90 ml/m LA Vol (A4C):   43.4 ml 16.70 ml/m LA Biplane Vol: 44.4 ml 17.09 ml/m  AORTIC VALVE LVOT Vmax:   104.00 cm/s LVOT Vmean:  68.800 cm/s LVOT VTI:    0.159 m  AORTA Ao Root diam: 3.80 cm Ao Asc diam:  3.90 cm MITRAL VALVE MV Area (PHT): 3.84 cm    SHUNTS MV Decel Time: 198 msec    Systemic VTI: 0.16 m MV E velocity: 73.80 cm/s MV A velocity: 72.85 cm/s MV E/A ratio:  1.01 Dietrich Pates MD Electronically signed by Dietrich Pates MD Signature Date/Time: 11/02/2023/7:50:48 PM    Final    DG Chest 1 View Result Date: 11/02/2023 CLINICAL DATA:  Hypoxia.  Recent nephrectomy. EXAM: CHEST  1 VIEW COMPARISON:  12/16/2020 FINDINGS: Stable heart size and mediastinal contours. Streaky opacities in the lung bases typical of atelectasis. There is moderate perihilar peribronchial thickening. No pneumothorax or significant pleural effusion. On limited assessment, no acute osseous findings. IMPRESSION: 1. Moderate perihilar peribronchial thickening, can be seen with bronchitis or reactive airways  disease. 2. Bibasilar atelectasis. Electronically Signed   By: Narda Rutherford M.D.   On: 11/02/2023 17:07     LOS: 1 day   Total time spent in review of labs and imaging, patient evaluation, formulation of plan, documentation and communication with patient/family: 50 minutes  Lanae Boast, MD [facility] 11/03/2023, 11:52 AM

## 2023-11-04 ENCOUNTER — Encounter: Payer: Self-pay | Admitting: Nurse Practitioner

## 2023-11-04 ENCOUNTER — Inpatient Hospital Stay (HOSPITAL_COMMUNITY)

## 2023-11-04 ENCOUNTER — Encounter (HOSPITAL_COMMUNITY): Payer: Self-pay | Admitting: Urology

## 2023-11-04 DIAGNOSIS — N179 Acute kidney failure, unspecified: Secondary | ICD-10-CM

## 2023-11-04 DIAGNOSIS — D72829 Elevated white blood cell count, unspecified: Secondary | ICD-10-CM

## 2023-11-04 DIAGNOSIS — D62 Acute posthemorrhagic anemia: Secondary | ICD-10-CM | POA: Diagnosis not present

## 2023-11-04 DIAGNOSIS — D49511 Neoplasm of unspecified behavior of right kidney: Secondary | ICD-10-CM | POA: Diagnosis not present

## 2023-11-04 DIAGNOSIS — J9601 Acute respiratory failure with hypoxia: Secondary | ICD-10-CM

## 2023-11-04 DIAGNOSIS — Z720 Tobacco use: Secondary | ICD-10-CM | POA: Diagnosis not present

## 2023-11-04 DIAGNOSIS — E871 Hypo-osmolality and hyponatremia: Secondary | ICD-10-CM

## 2023-11-04 DIAGNOSIS — I10 Essential (primary) hypertension: Secondary | ICD-10-CM

## 2023-11-04 DIAGNOSIS — F10931 Alcohol use, unspecified with withdrawal delirium: Secondary | ICD-10-CM | POA: Diagnosis not present

## 2023-11-04 LAB — BLOOD GAS, ARTERIAL
Acid-Base Excess: 3.4 mmol/L — ABNORMAL HIGH (ref 0.0–2.0)
Bicarbonate: 28.5 mmol/L — ABNORMAL HIGH (ref 20.0–28.0)
Drawn by: 331471
O2 Saturation: 94.8 %
Patient temperature: 37
pCO2 arterial: 44 mmHg (ref 32–48)
pH, Arterial: 7.42 (ref 7.35–7.45)
pO2, Arterial: 62 mmHg — ABNORMAL LOW (ref 83–108)

## 2023-11-04 LAB — CBC
HCT: 31.9 % — ABNORMAL LOW (ref 39.0–52.0)
Hemoglobin: 10 g/dL — ABNORMAL LOW (ref 13.0–17.0)
MCH: 31.9 pg (ref 26.0–34.0)
MCHC: 31.3 g/dL (ref 30.0–36.0)
MCV: 101.9 fL — ABNORMAL HIGH (ref 80.0–100.0)
Platelets: 263 10*3/uL (ref 150–400)
RBC: 3.13 MIL/uL — ABNORMAL LOW (ref 4.22–5.81)
RDW: 13.2 % (ref 11.5–15.5)
WBC: 17.8 10*3/uL — ABNORMAL HIGH (ref 4.0–10.5)
nRBC: 0 % (ref 0.0–0.2)

## 2023-11-04 LAB — GLUCOSE, CAPILLARY
Glucose-Capillary: 138 mg/dL — ABNORMAL HIGH (ref 70–99)
Glucose-Capillary: 148 mg/dL — ABNORMAL HIGH (ref 70–99)
Glucose-Capillary: 159 mg/dL — ABNORMAL HIGH (ref 70–99)
Glucose-Capillary: 181 mg/dL — ABNORMAL HIGH (ref 70–99)
Glucose-Capillary: 184 mg/dL — ABNORMAL HIGH (ref 70–99)
Glucose-Capillary: 186 mg/dL — ABNORMAL HIGH (ref 70–99)

## 2023-11-04 LAB — SURGICAL PATHOLOGY

## 2023-11-04 LAB — BASIC METABOLIC PANEL WITH GFR
Anion gap: 10 (ref 5–15)
Anion gap: 10 (ref 5–15)
BUN: 32 mg/dL — ABNORMAL HIGH (ref 6–20)
BUN: 30 mg/dL — ABNORMAL HIGH (ref 6–20)
CO2: 24 mmol/L (ref 22–32)
CO2: 23 mmol/L (ref 22–32)
Calcium: 9 mg/dL (ref 8.9–10.3)
Calcium: 9.1 mg/dL (ref 8.9–10.3)
Chloride: 98 mmol/L (ref 98–111)
Chloride: 100 mmol/L (ref 98–111)
Creatinine, Ser: 1.39 mg/dL — ABNORMAL HIGH (ref 0.61–1.24)
Creatinine, Ser: 1.08 mg/dL (ref 0.61–1.24)
GFR, Estimated: >60 mL/min (ref >60)
GFR, Estimated: >60 mL/min (ref >60)
Glucose, Bld: 176 mg/dL — ABNORMAL HIGH (ref 70–99)
Glucose, Bld: 157 mg/dL — ABNORMAL HIGH (ref 70–99)
Potassium: 5.2 mmol/L — ABNORMAL HIGH (ref 3.5–5.1)
Potassium: 4.8 mmol/L (ref 3.5–5.1)
Sodium: 132 mmol/L — ABNORMAL LOW (ref 135–145)
Sodium: 133 mmol/L — ABNORMAL LOW (ref 135–145)

## 2023-11-04 LAB — D-DIMER, QUANTITATIVE: D-Dimer, Quant: 2.59 ug{FEU}/mL — ABNORMAL HIGH (ref 0.00–0.50)

## 2023-11-04 LAB — MRSA NEXT GEN BY PCR, NASAL: MRSA by PCR Next Gen: NOT DETECTED

## 2023-11-04 MED ORDER — METHYLPREDNISOLONE SODIUM SUCC 125 MG IJ SOLR
80.0000 mg | Freq: Two times a day (BID) | INTRAMUSCULAR | Status: DC
  Administered 2023-11-04: 80 mg via INTRAVENOUS
  Filled 2023-11-04: qty 2

## 2023-11-04 MED ORDER — CHLORDIAZEPOXIDE HCL 25 MG PO CAPS
25.0000 mg | ORAL_CAPSULE | Freq: Four times a day (QID) | ORAL | Status: DC
  Administered 2023-11-04: 25 mg via ORAL
  Filled 2023-11-04 (×2): qty 1

## 2023-11-04 MED ORDER — SUCCINYLCHOLINE CHLORIDE 200 MG/10ML IV SOSY
PREFILLED_SYRINGE | INTRAVENOUS | Status: AC
Start: 2023-11-04 — End: 2023-11-05
  Filled 2023-11-04: qty 10

## 2023-11-04 MED ORDER — ORAL CARE MOUTH RINSE
15.0000 mL | OROMUCOSAL | Status: DC
  Administered 2023-11-05 (×2): 15 mL via OROMUCOSAL

## 2023-11-04 MED ORDER — CHLORDIAZEPOXIDE HCL 25 MG PO CAPS: 25.0000 mg | ORAL_CAPSULE | Freq: Four times a day (QID) | ORAL | Status: DC | PRN

## 2023-11-04 MED ORDER — HYDRALAZINE HCL 20 MG/ML IJ SOLN
10.0000 mg | Freq: Four times a day (QID) | INTRAMUSCULAR | Status: DC | PRN
  Filled 2023-11-04: qty 1

## 2023-11-04 MED ORDER — ADULT MULTIVITAMIN W/MINERALS CH: 1.0000 | ORAL_TABLET | Freq: Every day | ORAL | Status: DC

## 2023-11-04 MED ORDER — THIAMINE HCL 100 MG/ML IJ SOLN
100.0000 mg | Freq: Once | INTRAMUSCULAR | Status: DC
  Filled 2023-11-04: qty 2

## 2023-11-04 MED ORDER — CHLORDIAZEPOXIDE HCL 25 MG PO CAPS: 25.0000 mg | ORAL_CAPSULE | Freq: Every day | ORAL | Status: DC

## 2023-11-04 MED ORDER — ONDANSETRON 4 MG PO TBDP: 4.0000 mg | ORAL_TABLET | Freq: Four times a day (QID) | ORAL | Status: AC | PRN

## 2023-11-04 MED ORDER — HYDROXYZINE HCL 25 MG PO TABS: 25.0000 mg | ORAL_TABLET | Freq: Four times a day (QID) | ORAL | Status: DC | PRN

## 2023-11-04 MED ORDER — PHENOBARBITAL SODIUM 130 MG/ML IJ SOLN
130.0000 mg | Freq: Once | INTRAMUSCULAR | Status: AC
  Administered 2023-11-05: 130 mg via INTRAVENOUS
  Filled 2023-11-04: qty 1

## 2023-11-04 MED ORDER — ROCURONIUM BROMIDE 10 MG/ML (PF) SYRINGE
PREFILLED_SYRINGE | INTRAVENOUS | Status: AC
  Administered 2023-11-05: 50 mg
  Filled 2023-11-04: qty 10

## 2023-11-04 MED ORDER — FENTANYL CITRATE PF 50 MCG/ML IJ SOSY
PREFILLED_SYRINGE | INTRAMUSCULAR | Status: AC
  Filled 2023-11-04: qty 2

## 2023-11-04 MED ORDER — CHLORDIAZEPOXIDE HCL 25 MG PO CAPS
25.0000 mg | ORAL_CAPSULE | Freq: Three times a day (TID) | ORAL | Status: DC
Start: 2023-11-06 — End: 2023-11-07

## 2023-11-04 MED ORDER — HEPARIN (PORCINE) 25000 UT/250ML-% IV SOLN: 2000.0000 [IU]/h | INTRAVENOUS | Status: DC

## 2023-11-04 MED ORDER — SODIUM CHLORIDE 0.9 % IV SOLN
INTRAVENOUS | Status: DC
Start: 2023-11-04 — End: 2023-11-05

## 2023-11-04 MED ORDER — FENTANYL CITRATE (PF) 100 MCG/2ML IJ SOLN
INTRAMUSCULAR | Status: AC
  Filled 2023-11-04: qty 2

## 2023-11-04 MED ORDER — DEXMEDETOMIDINE HCL IN NACL 400 MCG/100ML IV SOLN
0.0000 ug/kg/h | INTRAVENOUS | Status: DC
  Administered 2023-11-04: 1.2 ug/kg/h via INTRAVENOUS
  Administered 2023-11-04: 0.4 ug/kg/h via INTRAVENOUS
  Administered 2023-11-05 (×2): 1.3 ug/kg/h via INTRAVENOUS
  Filled 2023-11-04 (×4): qty 100

## 2023-11-04 MED ORDER — METHYLPREDNISOLONE SODIUM SUCC 40 MG IJ SOLR
40.0000 mg | Freq: Two times a day (BID) | INTRAMUSCULAR | Status: DC
  Administered 2023-11-05 – 2023-11-07 (×5): 40 mg via INTRAVENOUS
  Filled 2023-11-04 (×5): qty 1

## 2023-11-04 MED ORDER — TECHNETIUM TO 99M ALBUMIN AGGREGATED
4.0000 | Freq: Once | INTRAVENOUS | Status: AC
  Administered 2023-11-04: 4.02 via INTRAVENOUS

## 2023-11-04 MED ORDER — ORAL CARE MOUTH RINSE: 15.0000 mL | OROMUCOSAL | Status: DC | PRN

## 2023-11-04 MED ORDER — DIAZEPAM 5 MG/ML IJ SOLN
5.0000 mg | Freq: Once | INTRAMUSCULAR | Status: AC
  Administered 2023-11-04: 5 mg via INTRAVENOUS
  Filled 2023-11-04: qty 2

## 2023-11-04 MED ORDER — POLYETHYLENE GLYCOL 3350 17 G PO PACK
17.0000 g | PACK | Freq: Two times a day (BID) | ORAL | Status: DC
  Administered 2023-11-04: 17 g via ORAL
  Filled 2023-11-04: qty 1

## 2023-11-04 MED ORDER — ETOMIDATE 2 MG/ML IV SOLN
INTRAVENOUS | Status: AC
Start: 2023-11-04 — End: 2023-11-05
  Administered 2023-11-05: 20 mg
  Filled 2023-11-04: qty 20

## 2023-11-04 MED ORDER — LACTULOSE 10 GM/15ML PO SOLN
20.0000 g | Freq: Once | ORAL | Status: AC
  Administered 2023-11-04: 20 g via ORAL
  Filled 2023-11-04: qty 30

## 2023-11-04 MED ORDER — LOPERAMIDE HCL 2 MG PO CAPS: 2.0000 mg | ORAL_CAPSULE | ORAL | Status: DC | PRN

## 2023-11-04 MED ORDER — CHLORDIAZEPOXIDE HCL 25 MG PO CAPS
50.0000 mg | ORAL_CAPSULE | Freq: Once | ORAL | Status: AC
  Administered 2023-11-04: 50 mg via ORAL
  Filled 2023-11-04: qty 2

## 2023-11-04 MED ORDER — CHLORHEXIDINE GLUCONATE CLOTH 2 % EX PADS
6.0000 | MEDICATED_PAD | Freq: Every day | CUTANEOUS | Status: DC
  Administered 2023-11-04 – 2023-11-11 (×8): 6 via TOPICAL

## 2023-11-04 MED ORDER — MIDAZOLAM HCL 2 MG/2ML IJ SOLN
INTRAMUSCULAR | Status: AC
  Administered 2023-11-05: 1 mg
  Filled 2023-11-04: qty 2

## 2023-11-04 MED ORDER — CHLORDIAZEPOXIDE HCL 25 MG PO CAPS: 25.0000 mg | ORAL_CAPSULE | ORAL | Status: DC

## 2023-11-04 NOTE — Progress Notes (Addendum)
   11/04/23 1410  What Happened  Was fall witnessed? Yes  Who witnessed fall? Darl Pikes NT  Patients activity before fall other (comment) (Crawling over siderails. Pts states he had to piss. RN helped pt use urinal. Pt got up byself and got back in bed.)  Point of contact buttocks  Was patient injured? No  Provider Notification  Provider Name/Title Kc  Date Provider Notified 11/04/23  Time Provider Notified 1424  Method of Notification Call (secure chat)  Notification Reason Fall  Provider response Other (Comment) (said to notify urology)  Date of Provider Response 11/04/23  Time of Provider Response 1424  Follow Up  Family notified Yes - comment (spouse- Victorino Dike)  Time family notified 1425  Additional tests No  Simple treatment Other (comment) (none needed)  Adult Fall Risk Assessment  Risk Factor Category (scoring not indicated) Fall has occurred during this admission (document High fall risk)  Patient Fall Risk Level High fall risk  Adult Fall Risk Interventions  Required Bundle Interventions *See Row Information* High fall risk - low, moderate, and high requirements implemented  Additional Interventions Reorient/diversional activities with confused patients;Use of appropriate toileting equipment (bedpan, BSC, etc.)  Fall intervention(s) refused/Patient educated regarding refusal Nonskid socks  Screening for Fall Injury Risk (To be completed on HIGH fall risk patients) - Assessing Need for Floor Mats  Risk For Fall Injury- Criteria for Floor Mats Confusion/dementia (+NuDESC, CIWA, TBI, etc.)  Will Implement Floor Mats Yes  Pain Assessment  Pain Scale 0-10  Pain Score 0  Neurological  Neuro (WDL) X  Level of Consciousness Alert  Orientation Level Oriented to person;Disoriented to place;Disoriented to time;Disoriented to situation  Cognition Impulsive;Poor attention/concentration;Poor judgement;Poor safety awareness;Unable to follow commands  Speech Clear  Neuro Symptoms  Drowsiness;Fatigue;Forgetful  Neuro symptoms relieved by Rest  Glasgow Coma Scale  Eye Opening 4  Best Verbal Response (NON-intubated) 4  Best Motor Response 6  Glasgow Coma Scale Score 14  Musculoskeletal  Musculoskeletal (WDL) X  Assistive Device Front wheel walker  Generalized Weakness Yes  Weight Bearing Restrictions Per Provider Order No  Integumentary  Integumentary (WDL) X  Skin Color Appropriate for ethnicity  Skin Condition Dry  Skin Integrity Intact;Ecchymosis;Erythema/redness  Ecchymosis Location Abdomen  Ecchymosis Location Orientation Bilateral  Erythema/Redness Location Face  Erythema/Redness Location Orientation Bilateral  Skin Turgor Non-tenting  Pain Assessment  Date Pain First Started  (no pain at this time)   Patient fell while this RN was on lunch break. Covering RN found patient sitting on buttocks on floor. NT witnessed fall while going to patients room to answer the bed alarm going off. Patient denies hitting head and denies pain. Care team made aware.

## 2023-11-04 NOTE — Plan of Care (Signed)

## 2023-11-04 NOTE — Progress Notes (Signed)
 PHARMACY - ANTICOAGULATION CONSULT NOTE  Pharmacy Consult for heparin Indication: possible pulmonary embolus  Allergies  Allergen Reactions   Morphine Itching and Nausea And Vomiting   Ace Inhibitors Cough   Adhesive [Tape] Itching and Rash    Patient Measurements: Height: 6\' 3"  (190.5 cm) Weight: 135 kg (297 lb 9.9 oz) IBW/kg (Calculated) : 84.5 HEPARIN DW (KG): 114.4  Vital Signs: Temp: 98.6 F (37 C) (04/07 1540) Temp Source: Oral (04/07 1540) BP: 150/90 (04/07 1415) Pulse Rate: 92 (04/07 1415)  Labs: Recent Labs    11/02/23 1409 11/03/23 0844 11/03/23 1020 11/04/23 0340 11/04/23 1210  HGB 11.1* 10.4*  --  10.0*  --   HCT 35.7* 32.3*  --  31.9*  --   PLT 228 224  --  263  --   CREATININE 2.82* 1.87*  --  1.39* 1.08  TROPONINIHS  --  8 6  --   --     Estimated Creatinine Clearance: 119.8 mL/min (by C-G formula based on SCr of 1.08 mg/dL).   Medical History: Past Medical History:  Diagnosis Date   Anxiety    Asthma    daily inhaler   Cancer (HCC)    pt. reports diagnosed with skin cancer on face/uncertain type but he reports may be basal cell, pending surgery to remove 09/03/18   CHF (congestive heart failure) (HCC)    no cardiologist   Chronic cough    states due to COPD   Chronic lower back pain    COPD (chronic obstructive pulmonary disease) (HCC)    no home O2   Diverticulitis 02/26/2013   Dyspnea    with humidity and high pollen count   Edentulous    upper gum   GERD (gastroesophageal reflux disease)    Gout    History of Clostridium difficile infection 2014   Hypercholesterolemia    Hypertension    states under control with med., has been on med. x 4 yr.   Insulin dependent diabetes mellitus    type 2    Osteoarthritis    bilat. hip   Panic attacks    Renal cell cancer, left (HCC) 2012   s/p partial nephrectomy   Shoulder pain, right 04/18/2017    Medications: No anticoagulants PTA -Enoxaparin 40 mg subQ q24h ordered post-op for DVT  ppx  Assessment: Pt is a 71 yoM with PMH significant for RCC, T2DM, HTN, COPD who underwent laparoscopic partial nephrectomy and hernia repair on 10/31/23. Pt developed AKI, hypoxia post-op.   Pharmacy consulted to dose heparin for possible PE.  -VQ scan 4/7: No sign acute PE  -AKI resolved, CTA ordered  Today, 11/04/23 CBC: Hgb low but stable, Plt WNL AKI resolved  Discussed with Dr. Chestine Spore on 4/7 - no heparin bolus for now since post-op.  Goal of Therapy:  Heparin level 0.3-0.7 units/ml Monitor platelets by anticoagulation protocol: Yes   Plan:  Discontinued enoxaparin No heparin bolus Initiate heparin infusion at 2000 units/hr Check 6 hour heparin level CBC, heparin level daily Monitor for signs of bleeding  Cindi Carbon, PharmD 11/04/2023,6:34 PM

## 2023-11-04 NOTE — Progress Notes (Signed)
 More agitated in ICU now that he is in 4-pt restraints. CIWA ativan given, still agitated. Given loading dose of librium. Since this will take time to work, starting precedex. Discussed with RN that he needs this lightened enough to take his librium overnight since precedex will not protect against ETOH w/d seizures.  Steffanie Dunn, DO 11/04/23 6:38 PM [facility]  For contact information, see Amion. If no response to pager, please call PCCM consult pager. After hours, 7PM- 7AM, please call Elink.

## 2023-11-04 NOTE — Progress Notes (Addendum)
 PROGRESS NOTE Preston [name]  ZOX:[id number] DOB: Dec 04, 1970 DOA: 10/31/2023 PCP: Claiborne Rigg, NP  Brief Narrative/Hospital Course: 53 y.o. male with past medical history of tobacco abuse, alcohol abuse, hypertension, hyperlipidemia, GERD, gout, anxiety, C. difficile colitis history, panic attack, class II obesity, grade 2 diastolic dysfunction, asthma, COPD who underwent right robotic assisted laparoscopic partial nephrectomy and then a robotic plus laparoscopic repair of abdominal hernia with mesh, Tap block bilateral AND hospitalist consulted due to new oxygen requirement, AKI with electrolyte imbalance and.  Patient has been complaining of wheezing and dyspnea very easily  w/ no chest pains, palpitations, diaphoresis or recent lower extremity edema. he is still an active smoker.  He drinks half to a full 12 pack of beers daily.   Subjective: Seen and examined this morning On the waiting VQ scan but could not be completed due to his altered mental status Overnight patient remains afebrile hemodynamically stable but still on 6 L [place] He is wheezing on expiration Intermittently confused Labs this morning-potassium 5.2 creatinine further better 1.3 Leukocytosis 16>17 likely from IV steroid. Last BM 4/2 before surgery  Assessment and plan:  Right renal neoplasm: S/p Right partial nephrectomy robotic assisted laparoscopic and hernia repair.  Postop plan per general surgery and nephrology, who are following closely. Continue wound care, monitor voids.  AKI Hyperkalemia: Baseline creatinine <1.On admission 1.7 AND peaked to 2.8 and with aggressive IV fluid hydration improving to 1.3  Encourage p.o. intake avoid NSAIDs .  Monitor potassium level as it is high-repeat labs this afternoon. Recent Labs    04/15/23 1659 07/15/23 1633 10/29/23 1142 10/31/23 0644 10/31/23 1400 11/01/23 0355 11/02/23 1409 11/03/23 0844 11/04/23 0340  BUN 5* 7 6 <5* 8 17 30* 35* 32*  CREATININE 0.68* 0.78  0.76 0.43* 1.56* 1.74* 2.82* 1.87* 1.39*  CO2 22 21 25  21* 23 24 23 24 24   K 4.6 5.2 5.3* 3.5 6.1* 5.9* 5.1 4.7 5.2*    Acute hypoxic respiratory failure- suspect chronic resp failure- per wife he has been very short of breath just even on driveway to home. Acute COPD/Asthma w/ exacerbation Asthma Tobacco abuse: Patient with new oxygen requirement also w/ exp wheezing. Liklye from copd exacerbation,abdomen distension and pain VQ was limited- but negative- will check D dimer CXR- moderate perihilar peribronchial thickening bibasilar atelectasis.  Still wheezing increase iv solumedrol, prn albuterol, Pulmicort Yupelri/Brovana, [place]- at 6l- cont ot keep spo>92% BNP is normal.TTE on 11/02/23 LBEF 50-55%, basal infr/inferoseptal hypokinesia, RVSFA mildy reduced, RV size is mildly enlarged-compared to echo 2021 EF is 55 to 60% no RWMA G2 DD RV systolic function size was normal- ?? If he developed PE- initially due to AKI VQ scan ordered- but unable to be completed due to his alcohol withdrawal-will do d dimer  ABG ordered-?If he has OSA/ohs given his obesity.Duplex legs negative for dvt. SERIAL troponins negative.   Constipation: Surgery following continue management constipation  He had good BM-yesterday.  Chronic diastolic CHF: Volume depleted periop.Chest x-ray no obvious pulmonary edema. Np normal at 90.Not on Lasix at home.  Hyponatremia: ? Beer potomania vs dehydration.Sodium improving nicely with IV fluids. Recent Labs  Lab 10/31/23 1400 11/01/23 0355 11/02/23 1409 11/03/23 0844 11/04/23 0340  NA 126* 127* 124* 128* 132*    Alcohol abuse  w/ withdrawal Recent decline in cognition/Memory per wife: Admitted drinking 12 pack of beer daily.He has been confused mostly in night since surgery per RN and gets better with daytime. He is on CIWA protocol thiamine  multivitamin and folate and alcohol cessation counseling. Cont PTOT  Tobacco abuse: smokes 2ck/day Cessation advised. Cont  nicotine patch.  T2DM with uncontrolled hyperglycemia up to 250s: Hyperlipidemia: PTA on metformin,and on hold . Hba1c stable 6.4. controlled on SSI 0-15 units, Lipitor, and monitor CBG Recent Labs  Lab 10/31/23 1656 10/31/23 1957 11/03/23 1615 11/03/23 2012 11/04/23 0014 11/04/23 0409 11/04/23 0727  GLUCAP  --    < > 303* 216* 138* 184* 181*  HGBA1C 6.4*  --   --   --   --   --   --    < > = values in this interval not displayed.    Essential hypertension: PTA on amlodipine, metoprolol, valsartan> BP fairly stable on amlodipine and metoprolol. Valsartan on hold due to AKI.   GERD: Continue PPI daily.  Chronic pain/neuropathy: Continue history BuSpar, Cymbalta, Neurontin    Class I Obesity w/ Body mass index is 37.2 kg/m.: Will benefit with PCP follow-up, weight loss,healthy lifestyle and outpatient sleep eval if not done.  Addendum: Came and examined patient bedside this evening: He is getting more delirious needing wrist restraint D-dimer elevated CTA pending still on 60 nasal cannula Has been getting Ativan from CIWA scale Discussed with attending Dr. Laverle Patter and planning for  PCCM consult and transferred to stepdown I was able to call and update his wife.  DVT prophylaxis: enoxaparin (LOVENOX) injection 40 mg Start: 11/03/23 1000 SCDs Start: 10/31/23 1607 Code Status:   Code Status: Full Code Family Communication: plan of care discussed with patient at bedside. Updated wife. Patient status is: Remains hospitalized because of severity of illness Level of care: Progressive   Dispo: The patient is from: home with  girlfriend            Anticipated disposition: TBD  Objective: Vitals last 24 hrs: Vitals:   11/04/23 0800 11/04/23 1001 11/04/23 1138 11/04/23 1141  BP: 138/87 131/75 (!) 140/89 (!) 140/89  Pulse: 79 92 92 94  Resp: 15 15 16    Temp:   97.8 F (36.6 C)   TempSrc:   Oral   SpO2: 95% 92% 91%   Weight:      Height:       Weight change:    Physical Examination: General exam: alert awake,and confused HEENT:Oral mucosa moist, Ear/Nose WNL grossly Respiratory system: Bilaterally expiratory wheezing diminished breath sound,no use of accessory muscle Cardiovascular system: S1 & S2 +, No JVD. Gastrointestinal system: Abdomen soft bruise present mild bleeding, surgical site C/D/I abdomen tender and distended Nervous System: Alert, awake, moving all extremities not oriented Extremities: LE edema neg,distal peripheral pulses palpable and warm.  Skin: No rashes,no icterus. MSK: Normal muscle bulk,tone, power   Medications reviewed:  Scheduled Meds:  albuterol  2.5 mg Nebulization Once   allopurinol  300 mg Oral Daily   amLODipine  10 mg Oral QHS   arformoterol  15 mcg Nebulization BID   atorvastatin  40 mg Oral QPM   bisacodyl  10 mg Rectal Daily   budesonide (PULMICORT) nebulizer solution  0.5 mg Nebulization BID   busPIRone  15 mg Oral BID   DULoxetine  60 mg Oral QHS   enoxaparin (LOVENOX) injection  40 mg Subcutaneous Q24H   folic acid  1 mg Oral Daily   gabapentin  900 mg Oral BID   insulin aspart  0-15 Units Subcutaneous Q4H   LORazepam  0-4 mg Oral Q6H   Followed by   LORazepam  0-4 mg Oral Q12H   methylPREDNISolone (  SOLU-MEDROL) injection  80 mg Intravenous Q12H   metoprolol tartrate  50 mg Oral BID   multivitamin with minerals  1 tablet Oral Daily   nicotine  21 mg Transdermal Daily   pantoprazole  80 mg Oral Daily   polycarbophil  625 mg Oral BID   polyethylene glycol  17 g Oral BID   revefenacin  175 mcg Nebulization Daily   sodium chloride flush  3 mL Intravenous Q12H   thiamine  100 mg Oral Daily   Or   thiamine  100 mg Intravenous Daily   Continuous Infusions:  sodium chloride     sodium chloride      Diet Order             Diet Heart Room service appropriate? Yes; Fluid consistency: Thin  Diet effective now           Diet - low sodium heart healthy                    Intake/Output  Summary (Last 24 hours) at 11/04/2023 1313 Last data filed at 11/04/2023 1135 Gross per 24 hour  Intake 1066.54 ml  Output 1830 ml  Net -763.46 ml   Net IO Since Admission: 926.1 mL [11/04/23 1313]  Wt Readings from Last 3 Encounters:  10/31/23 135 kg  10/29/23 135 kg  10/14/23 135.2 kg     Unresulted Labs (From admission, onward)     Start     Ordered   11/04/23 1229  D-dimer, quantitative  ONCE - STAT,   STAT        11/04/23 1228   11/04/23 0500  Basic metabolic panel with GFR  Daily,   R      11/03/23 0837   11/04/23 0500  CBC  Daily,   R      11/03/23 0837   11/02/23 1513  Sodium, urine, random  Once,   R        11/02/23 1512   11/02/23 1513  Osmolality, urine  Once,   R        11/02/23 1512          Data Reviewed: I have personally reviewed following labs and imaging studies ( see epic result tab) CBC: Recent Labs  Lab 10/29/23 1142 10/31/23 1400 11/01/23 0355 11/02/23 1409 11/03/23 0844 11/04/23 0340  WBC 11.5*  --   --  16.2* 16.5* 17.8*  HGB 16.5 14.8 12.6* 11.1* 10.4* 10.0*  HCT 49.1 45.2 38.3* 35.7* 32.3* 31.9*  MCV 94.4  --   --  102.0* 97.6 101.9*  PLT 282  --   --  228 224 263   CMP: Recent Labs  Lab 11/01/23 0355 11/02/23 1409 11/03/23 0844 11/04/23 0340 11/04/23 1210  NA 127* 124* 128* 132* 133*  K 5.9* 5.1 4.7 5.2* 4.8  CL 92* 89* 94* 98 100  CO2 24 23 24 24 23   GLUCOSE 125* 158* 151* 176* 157*  BUN 17 30* 35* 32* 30*  CREATININE 1.74* 2.82* 1.87* 1.39* 1.08  CALCIUM 8.0* 8.4* 8.5* 9.0 9.1  MG  --  2.5*  --   --   --   PHOS  --  4.0  --   --   --    GFR: Estimated Creatinine Clearance: 119.8 mL/min (by C-G formula based on SCr of 1.08 mg/dL). Recent Labs  Lab 11/02/23 1409  AST 64*  ALT 26  ALKPHOS 59  BILITOT 0.7  PROT 6.9  ALBUMIN 3.5  No results for input(s): "HGBA1C" in the last 72 hours.  Recent Labs  Lab 11/03/23 2012 11/04/23 0014 11/04/23 0409 11/04/23 0727 11/04/23 1148  GLUCAP 216* 138* 184* 181* 159*   Antimicrobials/Microbiology: Anti-infectives (From admission, onward)    Start     Dose/Rate Route Frequency Ordered Stop   10/31/23 2000  ceFAZolin (ANCEF) IVPB 1 g/50 mL premix        1 g 100 mL/hr over 30 Minutes Intravenous Every 8 hours 10/31/23 1606 11/01/23 0449   10/31/23 0600  ceFAZolin (ANCEF) IVPB 2g/100 mL premix  Status:  Discontinued        2 g 200 mL/hr over 30 Minutes Intravenous On call to O.R. 10/31/23 0536 10/31/23 0538   10/31/23 0536  ceFAZolin (ANCEF) IVPB 2g/100 mL premix        2 g 200 mL/hr over 30 Minutes Intravenous 30 min pre-op 10/31/23 0536 10/31/23 1127         Component Value Date/Time   SDES NOSE 12/12/2016 1737   SPECREQUEST NONE 12/12/2016 1737   CULT NO MRSA DETECTED 12/12/2016 1737   REPTSTATUS 12/15/2016 FINAL 12/12/2016 1737     Radiology Studies: NM Pulmonary Perf and Vent Result Date: 11/04/2023 CLINICAL DATA:  Evaluate for pulmonary embolism. History of COPD and smoker. Asthma. EXAM: NUCLEAR MEDICINE PERFUSION LUNG SCAN TECHNIQUE: Perfusion images were obtained in multiple projections after intravenous injection of radiopharmaceutical. Ventilation scans intentionally deferred if perfusion scan and chest x-ray adequate for interpretation during COVID 19 epidemic. RADIOPHARMACEUTICALS:  4.02 mCi Tc-29m MAA IV COMPARISON:  Chest radiograph from 11/02/2023 FINDINGS: On the anterior and posterior projection images there is no peripheral segmental perfusion defects to indicate pulmonary embolism. The right posterior oblique and left anterior oblique images were obscured by motion artifact. Patient was unable to tolerate the lateral projection images. IMPRESSION: 1. No signs of acute pulmonary embolism within the limitations described above. Electronically Signed   By: Signa Kell M.D.   On: 11/04/2023 12:16   VAS [place] LOWER EXTREMITY VENOUS (DVT) Result Date: 11/04/2023  Lower Venous DVT Study Patient Name:  Preston [name]  Date of Exam:   11/03/2023  Medical Rec #: [id number]        Accession #:    [id number] Date of Birth: 06-11-1971        Patient Gender: M Patient Age:   40 years Exam Location:  [facility] Procedure:      VAS [place] LOWER EXTREMITY VENOUS (DVT) Referring Phys: Klayton Monie --------------------------------------------------------------------------------  Indications: SOB, and Edema.  Risk Factors: Surgery Hernia repair and partial right nephrectomy (10/31/23) Hx of left renal cell cancer, recent diagnosis of right renal cell cancer. Comparison Study: Previous exam on 03/13/2015 was negative for DVT Performing Technologist: Ernestene Mention RVT, RDMS  Examination Guidelines: A complete evaluation includes B-mode imaging, spectral Doppler, color Doppler, and power Doppler as needed of all accessible portions of each vessel. Bilateral testing is considered an integral part of a complete examination. Limited examinations for reoccurring indications may be performed as noted. The reflux portion of the exam is performed with the patient in reverse Trendelenburg.  +---------+---------------+---------+-----------+----------+--------------+ RIGHT    CompressibilityPhasicitySpontaneityPropertiesThrombus Aging +---------+---------------+---------+-----------+----------+--------------+ CFV      Full           Yes      Yes                                 +---------+---------------+---------+-----------+----------+--------------+  SFJ      Full                                                        +---------+---------------+---------+-----------+----------+--------------+ FV Prox  Full           Yes      Yes                                 +---------+---------------+---------+-----------+----------+--------------+ FV Mid   Full           Yes      Yes                                 +---------+---------------+---------+-----------+----------+--------------+ FV DistalFull           Yes      Yes                                  +---------+---------------+---------+-----------+----------+--------------+ PFV      Full                                                        +---------+---------------+---------+-----------+----------+--------------+ POP      Full           Yes      Yes                                 +---------+---------------+---------+-----------+----------+--------------+ PTV      Full                                                        +---------+---------------+---------+-----------+----------+--------------+ PERO     Full                                                        +---------+---------------+---------+-----------+----------+--------------+   +---------+---------------+---------+-----------+----------+--------------+ LEFT     CompressibilityPhasicitySpontaneityPropertiesThrombus Aging +---------+---------------+---------+-----------+----------+--------------+ CFV      Full           Yes      Yes                                 +---------+---------------+---------+-----------+----------+--------------+ SFJ      Full                                                        +---------+---------------+---------+-----------+----------+--------------+  FV Prox  Full           Yes      Yes                                 +---------+---------------+---------+-----------+----------+--------------+ FV Mid   Full           Yes      Yes                                 +---------+---------------+---------+-----------+----------+--------------+ FV DistalFull           Yes      Yes                                 +---------+---------------+---------+-----------+----------+--------------+ PFV      Full                                                        +---------+---------------+---------+-----------+----------+--------------+ POP      Full           Yes      Yes                                  +---------+---------------+---------+-----------+----------+--------------+ PTV      Full                                                        +---------+---------------+---------+-----------+----------+--------------+ PERO     Full                                                        +---------+---------------+---------+-----------+----------+--------------+     Summary: BILATERAL: - No evidence of deep vein thrombosis seen in the lower extremities, bilaterally. -No evidence of popliteal cyst, bilaterally.   *See table(s) above for measurements and observations. Electronically signed by Coral Else MD on 11/04/2023 at 8:59:01 AM.    Final    DG Abd Portable 1V Result Date: 11/03/2023 CLINICAL DATA: Constipation EXAM: PORTABLE ABDOMEN - 1 VIEW COMPARISON:  CT 08/06/2023 FINDINGS: Several mildly dilated small bowel loops in the mid abdomen, possibly in the area of ventral hernia seen on prior CT. Cannot exclude small bowel obstruction. Gas is seen within nondistended colon. No free air, organomegaly or suspicious calcification. IMPRESSION: Mildly prominent mid abdominal small bowel loops. Cannot exclude small bowel obstruction, particularly given the ventral hernia seen on prior CT. Electronically Signed   By: Charlett Nose M.D.   On: 11/03/2023 12:16   ECHOCARDIOGRAM COMPLETE Result Date: 11/02/2023    ECHOCARDIOGRAM REPORT   Patient Name:   Preston [name] Date of Exam: 11/02/2023 Medical Rec #:  [id number]       Height:       75.0 in  Accession #:    [id number]      Weight:       297.6 lb Date of Birth:  1971/05/22       BSA:          2.598 m Patient Age:    52 years        BP:           112/70 mmHg Patient Gender: M               HR:           88 bpm. Exam Location:  Inpatient Procedure: 2D Echo, Cardiac Doppler, Color Doppler and Intracardiac            Opacification Agent (Both Spectral and Color Flow Doppler were            utilized during procedure). Indications:    R06.02 SOB; I50.40*  Unspecified combined systolic (congestive)                 and diastolic (congestive) heart failure  History:        Patient has prior history of Echocardiogram examinations, most                 recent 10/28/2019. CHF, Signs/Symptoms:Syncope; Risk                 Factors:Current Smoker, Dyslipidemia and Hypertension. ETOH.                 Cancer.  Sonographer:    Sheralyn Boatman RDCS Referring Phys: [id number] DAVID MANUEL ORTIZ  Sonographer Comments: Technically difficult study due to poor echo windows and patient is obese. Image acquisition challenging due to patient body habitus. IMPRESSIONS  1. Difficult acoustic windows limit study even with Definity used.  2. Difficult acoustic windows Definity used Basal inferior/inferoseptal hypokinesis. . Left ventricular ejection fraction, by estimation, is 50 to 55%. The left ventricle has low normal function. The left ventricular internal cavity size was mildly dilated. Left ventricular diastolic parameters were normal.  3. Right ventricular systolic function is mildly reduced. The right ventricular size is mildly enlarged. Tricuspid regurgitation signal is inadequate for assessing PA pressure.  4. The mitral valve is grossly normal. Trivial mitral valve regurgitation.  5. The aortic valve is tricuspid. Aortic valve regurgitation is not visualized.  6. The inferior vena cava is normal in size with greater than 50% respiratory variability, suggesting right atrial pressure of 3 mmHg. FINDINGS  Left Ventricle: Difficult acoustic windows Definity used Basal inferior/inferoseptal hypokinesis. Left ventricular ejection fraction, by estimation, is 50 to 55%. The left ventricle has low normal function. Definity contrast agent was given IV to delineate the left ventricular endocardial borders. The left ventricular internal cavity size was mildly dilated. There is no left ventricular hypertrophy. Left ventricular diastolic parameters were normal. Right Ventricle: The right ventricular size  is mildly enlarged. Right vetricular wall thickness was not assessed. Right ventricular systolic function is mildly reduced. Tricuspid regurgitation signal is inadequate for assessing PA pressure. Left Atrium: Left atrial size was normal in size. Right Atrium: Right atrial size was normal in size. Pericardium: There is no evidence of pericardial effusion. Mitral Valve: The mitral valve is grossly normal. Trivial mitral valve regurgitation. Tricuspid Valve: The tricuspid valve is normal in structure. Tricuspid valve regurgitation is trivial. Aortic Valve: The aortic valve is tricuspid. Aortic valve regurgitation is not visualized. Pulmonic Valve: The pulmonic valve was normal in structure. Pulmonic valve regurgitation is not visualized. Aorta: The  aortic root and ascending aorta are structurally normal, with no evidence of dilitation. Venous: The inferior vena cava is normal in size with greater than 50% respiratory variability, suggesting right atrial pressure of 3 mmHg. IAS/Shunts: No atrial level shunt detected by color flow Doppler.  LEFT VENTRICLE PLAX 2D LVIDd:         5.60 cm      Diastology LVIDs:         4.10 cm      LV e' medial:    7.29 cm/s LV PW:         1.00 cm      LV E/e' medial:  10.1 LV IVS:        1.00 cm      LV e' lateral:   10.20 cm/s                             LV E/e' lateral: 7.2  LV Volumes (MOD) LV vol d, MOD A2C: 121.0 ml LV vol d, MOD A4C: 143.0 ml LV vol s, MOD A2C: 59.1 ml LV vol s, MOD A4C: 67.3 ml LV SV MOD A2C:     61.9 ml LV SV MOD A4C:     143.0 ml LV SV MOD BP:      67.5 ml RIGHT VENTRICLE             IVC RV S prime:     16.10 cm/s  IVC diam: 1.40 cm TAPSE (M-mode): 1.9 cm LEFT ATRIUM             Index        RIGHT ATRIUM           Index LA diam:        4.10 cm 1.58 cm/m   RA Area:     20.30 cm LA Vol (A2C):   41.9 ml 16.13 ml/m  RA Volume:   69.90 ml  26.90 ml/m LA Vol (A4C):   43.4 ml 16.70 ml/m LA Biplane Vol: 44.4 ml 17.09 ml/m  AORTIC VALVE LVOT Vmax:   104.00 cm/s LVOT  Vmean:  68.800 cm/s LVOT VTI:    0.159 m  AORTA Ao Root diam: 3.80 cm Ao Asc diam:  3.90 cm MITRAL VALVE MV Area (PHT): 3.84 cm    SHUNTS MV Decel Time: 198 msec    Systemic VTI: 0.16 m MV E velocity: 73.80 cm/s MV A velocity: 72.85 cm/s MV E/A ratio:  1.01 Dietrich Pates MD Electronically signed by Dietrich Pates MD Signature Date/Time: 11/02/2023/7:50:48 PM    Final    DG Chest 1 View Result Date: 11/02/2023 CLINICAL DATA:  Hypoxia.  Recent nephrectomy. EXAM: CHEST  1 VIEW COMPARISON:  12/16/2020 FINDINGS: Stable heart size and mediastinal contours. Streaky opacities in the lung bases typical of atelectasis. There is moderate perihilar peribronchial thickening. No pneumothorax or significant pleural effusion. On limited assessment, no acute osseous findings. IMPRESSION: 1. Moderate perihilar peribronchial thickening, can be seen with bronchitis or reactive airways disease. 2. Bibasilar atelectasis. Electronically Signed   By: Narda Rutherford M.D.   On: 11/02/2023 17:07     LOS: 2 days   Total time spent in review of labs and imaging, patient evaluation, formulation of plan, documentation and communication with patient/family: 50 minutes  Lanae Boast, MD [facility] 11/04/2023, 1:13 PM

## 2023-11-04 NOTE — Progress Notes (Signed)
 ABG ON HOLD DUE TO PT OFF THE FLOOR.

## 2023-11-04 NOTE — TOC Progression Note (Signed)
 Transition of Care [facility]) - Progression Note    Patient Details  Name: Preston [name] MRN: [id number] Date of Birth: [birth year]  [facility] CM/SW Contact  Larrie Kass, LCSW Phone Number: 11/04/2023, 2:52 PM  Clinical Narrative:     Substance resources added to pt's AVS. TOC to follow for d/c needs.  Expected Discharge Plan: (P) Home/Self Care Barriers to Discharge: (P) Continued Medical Work up  Expected Discharge Plan and Services         Expected Discharge Date: 11/02/23                                     Social Determinants of Health (SDOH) Interventions SDOH Screenings   Food Insecurity: No Food Insecurity (11/01/2023)  Recent Concern: Food Insecurity - Food Insecurity Present (10/31/2023)  Housing: Low Risk  (10/31/2023)  Transportation Needs: No Transportation Needs (10/31/2023)  Utilities: Not At Risk (10/31/2023)  Alcohol Screen: Low Risk  (07/11/2023)  Depression (PHQ2-9): Low Risk  (10/14/2023)  Financial Resource Strain: High Risk (07/11/2023)  Physical Activity: Unknown (07/11/2023)  Social Connections: Unknown (07/11/2023)  Stress: Stress Concern Present (07/11/2023)  Tobacco Use: High Risk (10/31/2023)    Readmission Risk Interventions     No data to display

## 2023-11-04 NOTE — Progress Notes (Signed)
   11/04/23 1415  Vitals  Temp 98.4 F (36.9 C)  Temp Source Oral  BP (!) 150/90  MAP (mmHg) 107  BP Method Automatic  Pulse Rate 92  Pulse Rate Source Monitor  Resp 18  Oxygen Therapy  SpO2 91 %  O2 Device Nasal Cannula  O2 Flow Rate (L/min) 6 L/min   Post-fall vital signs are above. [facility], Insurance underwriter, and charge nurse made aware. Spouse- Victorino Dike also called and updated.

## 2023-11-04 NOTE — Inpatient Diabetes Management (Signed)
 Inpatient Diabetes Program Recommendations  AACE/ADA: New Consensus Statement on Inpatient Glycemic Control (2015)  Target Ranges:  Prepandial:   less than 140 mg/dL      Peak postprandial:   less than 180 mg/dL (1-2 hours)      Critically ill patients:  140 - 180 mg/dL   Lab Results  Component Value Date   GLUCAP 181 (H) 11/04/2023   HGBA1C 6.4 (H) 10/31/2023    Review of Glycemic Control  Latest Reference Range & Units 11/03/23 07:45 11/03/23 11:53 11/03/23 16:15 11/03/23 20:12 11/04/23 00:14 11/04/23 04:09 11/04/23 07:27  Glucose-Capillary 70 - 99 mg/dL 161 (H) 096 (H) 045 (H) 216 (H) 138 (H) 184 (H) 181 (H)   Diabetes history: DM 2 Outpatient Diabetes medications: metformin 1000 mg bid Current orders for Inpatient glycemic control:  Novolog 0-15 units Q4 hours  A1c 6.4% on 4/3  Inpatient Diabetes Program Recommendations:    -  Consider adding Novolog 2 units tid meal coverage if eating >50% of meals  Thanks, Christena Deem RN, MSN, BC-ADM Inpatient Diabetes Coordinator Team Pager [phone] (8a-5p)

## 2023-11-04 NOTE — Progress Notes (Addendum)
 eLink Physician-Brief Progress Note Patient Name: Preston [name] DOB: [birth year] MRN: [id number]   Date of Service  11/04/2023  HPI/Events of Note  53 year old presented with urology for partial nephrectomy complicated by hypoxic respiratory failure requiring high flow nasal cannula and concern for PE.  Starting heparin.  Patient is severely agitated and withdrawing from alcohol.  Loaded on Librium, but patient is no longer able to tolerate oral meds safely.  eICU Interventions  Will add diazepam x 1.  Given substantial work of breathing, hypoxemia, and agitated delirium, the patient may be candidate for intubation.  Will discuss with ground team.   2222 -patient remains severely agitated with high flow nasal cannula.  Has increased work of breathing.  Diazepam starting to wear off.  Will administer phenobarbital and increase Precedex to 1.5 mcg.  Intervention Category Minor Interventions: Agitation / anxiety - evaluation and management  Jamylah Marinaccio 11/04/2023, 8:37 PM

## 2023-11-04 NOTE — Progress Notes (Signed)
 PT Cancellation Note  Patient Details Name: BRYTEN MAHER MRN: [id number] DOB: 12/15/1970   Cancelled Treatment:    Reason Eval/Treat Not Completed: Patient at procedure or test/unavailable. Pt down for PE scan. RN reports pt significantly confused last night and this morning. Will continue to check back as schedule permits.    Tori Starsky Nanna PT, DPT 11/04/23, 10:52 AM

## 2023-11-04 NOTE — Progress Notes (Signed)
 NAME:  Preston [name], MRN:  [id number], DOB:  Oct 28, 1970, LOS: 2 ADMISSION DATE:  10/31/2023, CONSULTATION DATE:  11/04/2023 REFERRING MD:  Dr. Lanae Boast - [facility], CHIEF COMPLAINT:  Hypoxia and concern for alcohol    History of Present Illness:  Preston [name] is a 53 y.o. with a past medical history significant for left partial nephrectomy for renal cell carcinoma, abdominal incisional hernia, TN, HLD, CHF, COPD, and GERD who presented for elective abdominal incision hernia repair with combination right robotic assisted laparoscopic partial nephrectomy.  Per chart review it appears patient tolerated procedures well.    Postop day 1 patient was seen with mild hypoxia with SpO2 84% on room air this was felt due to splinting secondary to pain and pulmonary hygiene was encouraged.  Hospitalist service was consulted 4/5 for persistent hypoxemia.   By 4/7 hypoxemia continued to persistent with AMS as well, concerning for ETOH withdrawal, PCCM consulted   Pertinent  Medical History  Left partial nephrectomy for renal cell carcinoma, abdominal incisional hernia, TN, HLD, CHF, COPD, and GERD  Significant Hospital Events: Including procedures, antibiotic start and stop dates in addition to other pertinent events   4/3 admitted for elective hernia repair and partial right nephrectomy  4/4 hypoxic on RA  4/7 Signs of alcohol withdrawal and AMS   Interim History / Subjective:  As above   Objective   Blood pressure (!) 150/90, pulse 92, temperature 98.6 F (37 C), temperature source Oral, resp. rate 18, height 6\' 3"  (1.905 m), weight 135 kg, SpO2 91%.    FiO2 (%):  [92 %] 92 %   Intake/Output Summary (Last 24 hours) at 11/04/2023 1712 Last data filed at 11/04/2023 1626 Gross per 24 hour  Intake 933.76 ml  Output 2655 ml  Net -1721.24 ml   Filed Weights   10/31/23 0537  Weight: 135 kg    Examination: General: Acute on chronically ill appearing middle aged male lying in bed, in NAD HEENT:  [place]/AT, MM pink/moist, PERRL,  Neuro: Delirious  CV: s1s2 regular rate and rhythm, no murmur, rubs, or gallops,  PULM:  Expiratory wheeze bilaterally, no increased work of breathing  GI: soft, bowel sounds active in all 4 quadrants, non-tender, non-distended, tolerating oral dit Extremities: warm/dry, no edema  Skin: no rashes or lesions  Resolved Hospital Problem list     Assessment & Plan:  Acute likely on chronic hypoxic respiratory failure Concern for underlying COPD Active tobacco use -Per [facility] evaluation patient was seen with expiratory wheezing consistent with acute COPD exacerbation P: Continue supplemental oxygen  CTA chest pending Head of bed elevated 30 degrees. Follow intermittent chest x-ray and ABG.   Ensure adequate pulmonary hygiene  Aspiration precautions  Continue Brovana, Pulmicort, and Yupelri Wean IV solumedrol to 40 Q12 given  Cessation education when appropriate  Diures as able   Right renal mass -S/p partial nephrectomy surgical pathology with renal papillary at AKI -resolved P: Primary management per urology Monitor urine output Ensure adequate renal perfusion  Chronic diastolic CHF  -ECHO 4/5 with EF 50-55% with mildly reduced RV function P: Continuous telemetry  Continue statin Heart health diet with sodium restriction when able  Strict intake and output  Daily weight to assess volume status Daily assessment for need to diurese  Closely monitor renal function and electrolytes  Supplemental oxygen as above  Goal of euvolemia  Early mild alcohol withdrawal  -At baseline patient drinks 12 beers per day  P: Librium taper  CIWA protocol  Seizure precautions  Supplement thiamine, folate, and multivitamin   Hyponatremia - improved  P; Trend BMP  Type 2 diabetes  P: SSI  CBG goal 140-180 CBG checks  GERD  P: PPI    Best Practice (right click and "Reselect all SmartList Selections" daily)   Diet/type: NPO DVT prophylaxis  LMWH Pressure ulcer(s): N/A GI prophylaxis: PPI Lines: N/A Foley:  N/A Code Status:  full code Last date of multidisciplinary goals of care discussion: Pending   Labs   CBC: Recent Labs  Lab 10/29/23 1142 10/31/23 1400 11/01/23 0355 11/02/23 1409 11/03/23 0844 11/04/23 0340  WBC 11.5*  --   --  16.2* 16.5* 17.8*  HGB 16.5 14.8 12.6* 11.1* 10.4* 10.0*  HCT 49.1 45.2 38.3* 35.7* 32.3* 31.9*  MCV 94.4  --   --  102.0* 97.6 101.9*  PLT 282  --   --  228 224 263    Basic Metabolic Panel: Recent Labs  Lab 11/01/23 0355 11/02/23 1409 11/03/23 0844 11/04/23 0340 11/04/23 1210  NA 127* 124* 128* 132* 133*  K 5.9* 5.1 4.7 5.2* 4.8  CL 92* 89* 94* 98 100  CO2 24 23 24 24 23   GLUCOSE 125* 158* 151* 176* 157*  BUN 17 30* 35* 32* 30*  CREATININE 1.74* 2.82* 1.87* 1.39* 1.08  CALCIUM 8.0* 8.4* 8.5* 9.0 9.1  MG  --  2.5*  --   --   --   PHOS  --  4.0  --   --   --    GFR: Estimated Creatinine Clearance: 119.8 mL/min (by C-G formula based on SCr of 1.08 mg/dL). Recent Labs  Lab 10/29/23 1142 11/02/23 1409 11/03/23 0844 11/04/23 0340  WBC 11.5* 16.2* 16.5* 17.8*    Liver Function Tests: Recent Labs  Lab 11/02/23 1409  AST 64*  ALT 26  ALKPHOS 59  BILITOT 0.7  PROT 6.9  ALBUMIN 3.5   No results for input(s): "LIPASE", "AMYLASE" in the last 168 hours. No results for input(s): "AMMONIA" in the last 168 hours.  ABG    Component Value Date/Time   PHART 7.42 11/04/2023 1130   PCO2ART 44 11/04/2023 1130   PO2ART 62 (L) 11/04/2023 1130   HCO3 28.5 (H) 11/04/2023 1130   TCO2 23 01/13/2015 0405   ACIDBASEDEF 10.9 (H) 12/12/2016 1355   O2SAT 94.8 11/04/2023 1130     Coagulation Profile: No results for input(s): "INR", "PROTIME" in the last 168 hours.  Cardiac Enzymes: No results for input(s): "CKTOTAL", "CKMB", "CKMBINDEX", "TROPONINI" in the last 168 hours.  HbA1C: HbA1c, POC (controlled diabetic range)  Date/Time Value Ref Range Status  10/14/2023 03:32  PM 6.7 0.0 - 7.0 % Final  04/15/2023 04:22 PM 6.9 0.0 - 7.0 % Final   Hgb A1c MFr Bld  Date/Time Value Ref Range Status  10/31/2023 04:56 PM 6.4 (H) 4.8 - 5.6 % Final    Comment:    (NOTE)         Prediabetes: 5.7 - 6.4         Diabetes: >6.4         Glycemic control for adults with diabetes: <7.0     CBG: Recent Labs  Lab 11/04/23 0014 11/04/23 0409 11/04/23 0727 11/04/23 1148 11/04/23 1628  GLUCAP 138* 184* 181* 159* 186*    Review of Systems:   Unable to review   Past Medical History:  He,  has a past medical history of Anxiety, Asthma, Cancer (HCC), CHF (congestive heart failure) (HCC), Chronic cough, Chronic  lower back pain, COPD (chronic obstructive pulmonary disease) (HCC), Diverticulitis (02/26/2013), Dyspnea, Edentulous, GERD (gastroesophageal reflux disease), Gout, History of Clostridium difficile infection (2014), Hypercholesterolemia, Hypertension, Insulin dependent diabetes mellitus, Osteoarthritis, Panic attacks, and Shoulder pain, right (04/18/2017).   Surgical History:   Past Surgical History:  Procedure Laterality Date   COLON SURGERY     14 inches of colon removed   COLONOSCOPY WITH PROPOFOL N/A 04/01/2013   Procedure: COLONOSCOPY WITH PROPOFOL;  Surgeon: Willis Modena, MD;  Location: [facility];  Service: Endoscopy;  Laterality: N/A;   FLEXIBLE SIGMOIDOSCOPY N/A 02/28/2013   Procedure: FLEXIBLE SIGMOIDOSCOPY;  Surgeon: Vertell Novak., MD;  Location: [facility];  Service: Endoscopy;  Laterality: N/A;   LAPAROSCOPIC APPENDECTOMY  07/05/2009   LAPAROSCOPIC LOW ANTERIOR RESECTION N/A 07/08/2013   Procedure: LAPAROSCOPIC LOW ANTERIOR RESECTION WITH TAKEDOWN OF SPLENIC FLEXURE.;  Surgeon: Ernestene Mention, MD;  Location: [facility];  Service: General;  Laterality: N/A;   MANDIBLE SURGERY Left 2010   "dry skin pocket cut out"   MULTIPLE EXTRACTIONS WITH ALVEOLOPLASTY N/A 04/24/2017   Procedure: SURGICAL REMOVAL OF TEETH NUMBERS `7 20-29 AND 31;  Surgeon:  Vivia Ewing, DMD;  Location: [facility];  Service: Oral Surgery;  Laterality: N/A;   ROBOT ASSISTED LAPAROSCOPIC NEPHRECTOMY Right 10/31/2023   Procedure: XI RIGHT ROBOTIC ASSISTED LAPAROSCOPIC PARTIAL NEPHRECTOMY;  Surgeon: Heloise Purpura, MD;  Location: [facility];  Service: Urology;  Laterality: Right;  150 MINUTES NEEDED FOR CASE   ROBOTIC ASSITED PARTIAL NEPHRECTOMY Left 05/02/2020   Procedure: XI ROBOTIC ASSITED LAPAROSCOPIC PARTIAL NEPHRECTOMY;  Surgeon: Heloise Purpura, MD;  Location: [facility];  Service: Urology;  Laterality: Left;   TOTAL HIP ARTHROPLASTY Left 06/25/2016   Procedure: TOTAL HIP ARTHROPLASTY ANTERIOR APPROACH;  Surgeon: Gean Birchwood, MD;  Location: [facility];  Service: Orthopedics;  Laterality: Left;   TOTAL HIP ARTHROPLASTY Right 09/10/2016   Procedure: TOTAL HIP ARTHROPLASTY ANTERIOR APPROACH;  Surgeon: Gean Birchwood, MD;  Location: [facility];  Service: Orthopedics;  Laterality: Right;   XI ROBOTIC ASSISTED VENTRAL HERNIA N/A 10/31/2023   Procedure: ROBOTIC + LAPAROSCOPIC REPAIR OF ABDOMINAL HERNIA WITH MESH, TAP BLOCK - BILATERAL;  Surgeon: Karie Soda, MD;  Location: [facility];  Service: General;  Laterality: N/A;     Social History:   reports that he has been smoking cigarettes. He has a 35 pack-year smoking history. He has quit using smokeless tobacco.  His smokeless tobacco use included chew. He reports current alcohol use. He reports that he does not use drugs.   Family History:  His family history includes Cancer in his father; Heart attack in his mother.   Allergies Allergies  Allergen Reactions   Morphine Itching and Nausea And Vomiting   Ace Inhibitors Cough   Adhesive [Tape] Itching and Rash     Home Medications  Prior to Admission medications   Medication Sig Start Date End Date Taking? Authorizing Provider  albuterol (PROVENTIL) (2.5 MG/3ML) 0.083% nebulizer solution Take 3 mLs (2.5 mg total) by nebulization every 6 (six) hours as needed for wheezing or  shortness of breath. 11/25/22  Yes Claiborne Rigg, NP  allopurinol (ZYLOPRIM) 300 MG tablet Take 1 tablet (300 mg total) by mouth daily. 07/15/23  Yes Claiborne Rigg, NP  amLODipine (NORVASC) 10 MG tablet Take 1 tablet (10 mg total) by mouth at bedtime. 07/15/23  Yes Claiborne Rigg, NP  atorvastatin (LIPITOR) 40 MG tablet Take 1 tablet (40 mg total) by mouth every evening. 07/15/23  Yes  Claiborne Rigg, NP  busPIRone (BUSPAR) 15 MG tablet TAKE 1 TO 2 TABLETS BY MOUTH TWICE DAILY FOR ANXIETY Patient taking differently: Take 15 mg by mouth 2 (two) times daily. 10/22/23  Yes Claiborne Rigg, NP  calcium carbonate (TUMS EX) 750 MG chewable tablet Chew 1 tablet (750 mg total) by mouth 3 (three) times daily as needed for heartburn. 11/25/22  Yes Claiborne Rigg, NP  colchicine 0.6 MG tablet Take 1 capsule (0.6 mg total) by mouth daily as needed. M10.9 07/16/23  Yes Hoy Register, MD  docusate sodium (COLACE) 100 MG capsule Take 1 capsule (100 mg total) by mouth 2 (two) times daily. 10/31/23  Yes Dancy, Marchelle Folks, PA-C  DULoxetine (CYMBALTA) 60 MG capsule Take 1 capsule (60 mg total) by mouth daily. Patient taking differently: Take 60 mg by mouth at bedtime. 07/15/23  Yes Claiborne Rigg, NP  gabapentin (NEURONTIN) 300 MG capsule Take 3 capsules (900 mg total) by mouth 2 (two) times daily. 07/15/23  Yes Claiborne Rigg, NP  hydrOXYzine (ATARAX) 50 MG tablet Take 1 tablet (50 mg total) by mouth 3 (three) times daily as needed. for anxiety 07/15/23  Yes Claiborne Rigg, NP  metFORMIN (GLUCOPHAGE) 500 MG tablet Take 2 tablets (1,000 mg total) by mouth 2 (two) times daily with a meal. TAKE 2 TABLETS BY MOUTH TWICE DAILY WITH  A  MEAL. OFFICE VISIT NEEDED FOR ADDITIONAL REFILLS 07/15/23  Yes Claiborne Rigg, NP  metoprolol tartrate (LOPRESSOR) 50 MG tablet Take 1 tablet (50 mg total) by mouth 2 (two) times daily. 07/15/23  Yes Claiborne Rigg, NP  omeprazole (PRILOSEC) 40 MG capsule Take 1 capsule (40 mg  total) by mouth daily. 07/15/23  Yes Claiborne Rigg, NP  oxyCODONE-acetaminophen (PERCOCET) 10-325 MG tablet Take 1 tablet by mouth every 6 (six) hours as needed for pain. 10/31/23 10/30/24 Yes Dancy, Marchelle Folks, PA-C  SYMBICORT 80-4.5 MCG/ACT inhaler Inhale 2 puffs into the lungs in the morning and at bedtime. 07/15/23  Yes Claiborne Rigg, NP  valsartan (DIOVAN) 80 MG tablet Take 1 tablet (80 mg total) by mouth daily. 08/12/23  Yes Conte, Tessa N, PA-C  VENTOLIN HFA 108 (90 Base) MCG/ACT inhaler Inhale 1-2 puffs into the lungs every 6 (six) hours as needed for wheezing or shortness of breath. 10/14/23  Yes Claiborne Rigg, NP  Blood Glucose Monitoring Suppl (ACCU-CHEK AVIVA PLUS) w/Device KIT 1 each by Does not apply route 3 (three) times daily. 11/07/16   Quentin Angst, MD  Blood Pressure Monitor DEVI Please provide patient with insurance approved blood pressure monitor I10.0 07/31/22   Claiborne Rigg, NP  famotidine (PEPCID) 20 MG tablet Take 1 tablet (20 mg total) by mouth every evening. Patient not taking: Reported on 10/25/2023 07/15/23   Claiborne Rigg, NP  glucose blood (ACCU-CHEK AVIVA PLUS) test strip Use as instructed for 3 times daily testing of blood sugar. E11.9 07/15/23   Claiborne Rigg, NP     Critical care time: NA  Miku Udall D. Harris, NP-C [facility] Personal contact information can be found on Amion  If no contact or response made please call [phone] 11/04/2023, 6:01 PM

## 2023-11-04 NOTE — Progress Notes (Signed)
 Patient ID: Preston [name], male   DOB: 11-17-1970, 53 y.o.   MRN: [id number]  4 Days Post-Op Subjective: Pt was stable but unable to be discharged on POD#1 due to mild oxygen requirement.  He was improved the following morning and was slated for discharge but developed worsening hypoxemia requiring increased supplemental oxygen.  As he has been in the hospital longer, he also developed alcohol withdrawal with an admitted higher alcohol intake daily at home than initially discussed. Now experiencing hallucinations and agitation consistent with alcohol withdrawal.  Hospitaltist service now involved to help manage his medical issues.  Also noted to have AKI (not unexpected due to partial nephrectomy) and now improving.  Objective: Vital signs in last 24 hours: Temp:  [97.8 F (36.6 C)-98.8 F (37.1 C)] 98.4 F (36.9 C) (04/07 0337) Pulse Rate:  [77-96] 80 (04/07 0402) Resp:  [13-22] 16 (04/07 0337) BP: (115-166)/(69-90) 139/87 (04/07 0402) SpO2:  [84 %-98 %] 94 % (04/07 0337)  Intake/Output from previous day: 04/06 0701 - 04/07 0700 In: 949.5 [P.O.:120; I.V.:829.5] Out: 1650 [Urine:1650] Intake/Output this shift: No intake/output data recorded.  Physical Exam:  General: Alert and oriented when aroused but somnolent.  Did receive Ativan last night due to agitation. CV: RRR Lungs: Clear Abd: Incisions clean without erythema Ext: No edema or erythema  Lab Results: Recent Labs    11/02/23 1409 11/03/23 0844 11/04/23 0340  HGB 11.1* 10.4* 10.0*  HCT 35.7* 32.3* 31.9*      Latest Ref Rng & Units 11/04/2023    3:40 AM 11/03/2023    8:44 AM 11/02/2023    2:09 PM  CBC  WBC 4.0 - 10.5 K/uL 17.8  16.5  16.2   Hemoglobin 13.0 - 17.0 g/dL 19.1  47.8  29.5   Hematocrit 39.0 - 52.0 % 31.9  32.3  35.7   Platelets 150 - 400 K/uL 263  224  228      BMET Recent Labs    11/03/23 0844 11/04/23 0340  NA 128* 132*  K 4.7 5.2*  CL 94* 98  CO2 24 24  GLUCOSE 151* 176*  BUN 35* 32*   CREATININE 1.87* 1.39*  CALCIUM 8.5* 9.0     Studies/Results: VAS [place] LOWER EXTREMITY VENOUS (DVT) Result Date: 11/03/2023  Lower Venous DVT Study Patient Name:  GARRIS MELHORN  Date of Exam:   11/03/2023 Medical Rec #: [id number]        Accession #:    [id number] Date of Birth: 10-Jan-1971        Patient Gender: M Patient Age:   39 years Exam Location:  [facility] [place] Procedure:      VAS [place] LOWER EXTREMITY VENOUS (DVT) Referring Phys: RAMESH KC --------------------------------------------------------------------------------  Indications: SOB, and Edema.  Risk Factors: Surgery Hernia repair and partial right nephrectomy (10/31/23) Hx of left renal cell cancer, recent diagnosis of right renal cell cancer. Comparison Study: Previous exam on 03/13/2015 was negative for DVT Performing Technologist: Ernestene Mention RVT, RDMS  Examination Guidelines: A complete evaluation includes B-mode imaging, spectral Doppler, color Doppler, and power Doppler as needed of all accessible portions of each vessel. Bilateral testing is considered an integral part of a complete examination. Limited examinations for reoccurring indications may be performed as noted. The reflux portion of the exam is performed with the patient in reverse Trendelenburg.  +---------+---------------+---------+-----------+----------+--------------+ RIGHT    CompressibilityPhasicitySpontaneityPropertiesThrombus Aging +---------+---------------+---------+-----------+----------+--------------+ CFV      Full           Yes  Yes                                 +---------+---------------+---------+-----------+----------+--------------+ SFJ      Full                                                        +---------+---------------+---------+-----------+----------+--------------+ FV Prox  Full           Yes      Yes                                 +---------+---------------+---------+-----------+----------+--------------+ FV Mid   Full            Yes      Yes                                 +---------+---------------+---------+-----------+----------+--------------+ FV DistalFull           Yes      Yes                                 +---------+---------------+---------+-----------+----------+--------------+ PFV      Full                                                        +---------+---------------+---------+-----------+----------+--------------+ POP      Full           Yes      Yes                                 +---------+---------------+---------+-----------+----------+--------------+ PTV      Full                                                        +---------+---------------+---------+-----------+----------+--------------+ PERO     Full                                                        +---------+---------------+---------+-----------+----------+--------------+   +---------+---------------+---------+-----------+----------+--------------+ LEFT     CompressibilityPhasicitySpontaneityPropertiesThrombus Aging +---------+---------------+---------+-----------+----------+--------------+ CFV      Full           Yes      Yes                                 +---------+---------------+---------+-----------+----------+--------------+ SFJ      Full                                                        +---------+---------------+---------+-----------+----------+--------------+  FV Prox  Full           Yes      Yes                                 +---------+---------------+---------+-----------+----------+--------------+ FV Mid   Full           Yes      Yes                                 +---------+---------------+---------+-----------+----------+--------------+ FV DistalFull           Yes      Yes                                 +---------+---------------+---------+-----------+----------+--------------+ PFV      Full                                                         +---------+---------------+---------+-----------+----------+--------------+ POP      Full           Yes      Yes                                 +---------+---------------+---------+-----------+----------+--------------+ PTV      Full                                                        +---------+---------------+---------+-----------+----------+--------------+ PERO     Full                                                        +---------+---------------+---------+-----------+----------+--------------+    Summary: BILATERAL: - No evidence of deep vein thrombosis seen in the lower extremities, bilaterally. -No evidence of popliteal cyst, bilaterally.   *See table(s) above for measurements and observations.    Preliminary    DG Abd Portable 1V Result Date: 11/03/2023 CLINICAL DATA: Constipation EXAM: PORTABLE ABDOMEN - 1 VIEW COMPARISON:  CT 08/06/2023 FINDINGS: Several mildly dilated small bowel loops in the mid abdomen, possibly in the area of ventral hernia seen on prior CT. Cannot exclude small bowel obstruction. Gas is seen within nondistended colon. No free air, organomegaly or suspicious calcification. IMPRESSION: Mildly prominent mid abdominal small bowel loops. Cannot exclude small bowel obstruction, particularly given the ventral hernia seen on prior CT. Electronically Signed   By: Charlett Nose M.D.   On: 11/03/2023 12:16   ECHOCARDIOGRAM COMPLETE Result Date: 11/02/2023    ECHOCARDIOGRAM REPORT   Patient Name:   SIMPSON PAULOS Date of Exam: 11/02/2023 Medical Rec #:  [id number]       Height:       75.0 in Accession #:    [id number]      Weight:  297.6 lb Date of Birth:  [birth year]       BSA:          2.598 m Patient Age:    52 years        BP:           112/70 mmHg Patient Gender: M               HR:           88 bpm. Exam Location:  Inpatient Procedure: 2D Echo, Cardiac Doppler, Color Doppler and Intracardiac            Opacification Agent (Both Spectral and Color  Flow Doppler were            utilized during procedure). 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 CHF, Signs/Symptoms:Syncope; Risk                 Factors:Current Smoker, Dyslipidemia and Hypertension. ETOH.                 Cancer.  Sonographer:    Sheralyn Boatman RDCS Referring Phys: [id number] DAVID MANUEL ORTIZ  Sonographer Comments: Technically difficult study due to poor echo windows and patient is obese. Image acquisition challenging due to patient body habitus. IMPRESSIONS  1. Difficult acoustic windows limit study even with Definity used.  2. Difficult acoustic windows Definity used Basal inferior/inferoseptal hypokinesis. . Left ventricular ejection fraction, by estimation, is 50 to 55%. The left ventricle has low normal function. The left ventricular internal cavity size was mildly dilated. Left ventricular diastolic parameters were normal.  3. Right ventricular systolic function is mildly reduced. The right ventricular size is mildly enlarged. Tricuspid regurgitation signal is inadequate for assessing PA pressure.  4. The mitral valve is grossly normal. Trivial mitral valve regurgitation.  5. The aortic valve is tricuspid. Aortic valve regurgitation is not visualized.  6. The inferior vena cava is normal in size with greater than 50% respiratory variability, suggesting right atrial pressure of 3 mmHg. FINDINGS  Left Ventricle: Difficult acoustic windows Definity used Basal inferior/inferoseptal hypokinesis. Left ventricular ejection fraction, by estimation, is 50 to 55%. The left ventricle has low normal function. Definity contrast agent was given IV to delineate the left ventricular endocardial borders. The left ventricular internal cavity size was mildly dilated. There is no left ventricular hypertrophy.  Left ventricular diastolic parameters were normal. Right Ventricle: The right ventricular size is mildly enlarged. Right vetricular wall thickness was not assessed. Right ventricular systolic function is mildly reduced. Tricuspid regurgitation signal is inadequate for assessing PA pressure. Left Atrium: Left atrial size was normal in size. Right Atrium: Right atrial size was normal in size. Pericardium: There is no evidence of pericardial effusion. Mitral Valve: The mitral valve is grossly normal. Trivial mitral valve regurgitation. Tricuspid Valve: The tricuspid valve is normal in structure. Tricuspid valve regurgitation is trivial. Aortic Valve: The aortic valve is tricuspid. Aortic valve regurgitation is not visualized. Pulmonic Valve: The pulmonic valve was normal in structure. Pulmonic valve regurgitation is not visualized. Aorta: The aortic root and ascending aorta are structurally normal, with no evidence of dilitation. Venous: The inferior vena cava  is normal in size with greater than 50% respiratory variability, suggesting right atrial pressure of 3 mmHg. IAS/Shunts: No atrial level shunt detected by color flow Doppler.  LEFT VENTRICLE PLAX 2D LVIDd:         5.60 cm      Diastology LVIDs:         4.10 cm      LV e' medial:    7.29 cm/s LV PW:         1.00 cm      LV E/e' medial:  10.1 LV IVS:        1.00 cm      LV e' lateral:   10.20 cm/s                             LV E/e' lateral: 7.2  LV Volumes (MOD) LV vol d, MOD A2C: 121.0 ml LV vol d, MOD A4C: 143.0 ml LV vol s, MOD A2C: 59.1 ml LV vol s, MOD A4C: 67.3 ml LV SV MOD A2C:     61.9 ml LV SV MOD A4C:     143.0 ml LV SV MOD BP:      67.5 ml RIGHT VENTRICLE             IVC RV S prime:     16.10 cm/s  IVC diam: 1.40 cm TAPSE (M-mode): 1.9 cm LEFT ATRIUM             Index        RIGHT ATRIUM           Index LA diam:        4.10 cm 1.58 cm/m   RA Area:     20.30 cm LA Vol (A2C):   41.9 ml 16.13 ml/m  RA Volume:   69.90 ml  26.90 ml/m LA Vol (A4C):   43.4  ml 16.70 ml/m LA Biplane Vol: 44.4 ml 17.09 ml/m  AORTIC VALVE LVOT Vmax:   104.00 cm/s LVOT Vmean:  68.800 cm/s LVOT VTI:    0.159 m  AORTA Ao Root diam: 3.80 cm Ao Asc diam:  3.90 cm MITRAL VALVE MV Area (PHT): 3.84 cm    SHUNTS MV Decel Time: 198 msec    Systemic VTI: 0.16 m MV E velocity: 73.80 cm/s MV A velocity: 72.85 cm/s MV E/A ratio:  1.01 Dietrich Pates MD Electronically signed by Dietrich Pates MD Signature Date/Time: 11/02/2023/7:50:48 PM    Final    DG Chest 1 View Result Date: 11/02/2023 CLINICAL DATA:  Hypoxia.  Recent nephrectomy. EXAM: CHEST  1 VIEW COMPARISON:  12/16/2020 FINDINGS: Stable heart size and mediastinal contours. Streaky opacities in the lung bases typical of atelectasis. There is moderate perihilar peribronchial thickening. No pneumothorax or significant pleural effusion. On limited assessment, no acute osseous findings. IMPRESSION: 1. Moderate perihilar peribronchial thickening, can be seen with bronchitis or reactive airways disease. 2. Bibasilar atelectasis. Electronically Signed   By: Narda Rutherford M.D.   On: 11/02/2023 17:07    Assessment/Plan: POD # 4 s/p right RAL partial nephrectomy and incisional hernia repair  Hypoxemia: Still requiring 6 L [place].  LE doppler negative for DVT.  VQ scan pending. AKI: Resolving Hyponatremia: Improving Renal mass: Pathology pending Post-op care: On pharmacologic DVT prophylaxis, ambulate, IS, pain control Alcohol withdrawal: On prophylaxis  Disposition: Pending resolution of medical issues including alcohol withdrawal and hypoxemia.  Greatly appreciate medicine involvement and input.   LOS: 2 days  Crecencio Mc 11/04/2023, 7:20 AM

## 2023-11-04 NOTE — Progress Notes (Signed)
 Patient ID: Preston [name], male   DOB: [birth year], 53 y.o.   MRN: [id number]  Pt with increasing agitation and confusion this afternoon requiring excessive nursing care.  Due to nursing needs and continued hypoxia without explanation, pulmonary critical care consult was obtained and patient was transferred to the ICU.  I talked with Victorino Dike, patient's main contact, and updated her.  She was prepared to help him home and care for him initially after surgery but will have to return to [place] on Thursday.  Patient may not have help at home as of Thursday.

## 2023-11-04 NOTE — Progress Notes (Signed)
 Report called and given to ICU

## 2023-11-04 NOTE — Progress Notes (Addendum)
 11/04/2023  Preston [name] [id number] Jul 08, 1971  CARE TEAM: PCP: Claiborne Rigg, NP  Outpatient Care Team: Patient Care Team: Claiborne Rigg, NP as PCP - General (Nurse Practitioner) Jake Bathe, MD as PCP - Cardiology (Cardiology) Willis Modena, MD as Consulting Physician (Gastroenterology) Karie Soda, MD as Consulting Physician (General Surgery) Heloise Purpura, MD as Consulting Physician (Urology)  Inpatient Treatment Team: Treatment Team:  Heloise Purpura, MD Karie Soda, MD Harrie Foreman, [place], MD Jessie Foot, MD Lanae Boast, MD Berna Bue, RN Shirlee Limerick, Gastroenterology [place], NT Iva Boop, RN Broussard, Loralie Champagne, PT   Problem List:   Principal Problem:   Neoplasm of right kidney Active Problems:   Tobacco abuse   HLD (hyperlipidemia)   Alcohol abuse   Chronic diastolic congestive heart failure (HCC)   Gastroesophageal reflux disease without esophagitis   Type 2 diabetes mellitus treated without insulin (HCC)   Essential hypertension   Acute kidney injury (HCC)   Obesity, Class II, BMI 35-39.9   Diastasis recti   Incarcerated incisional hernia s/p repair w mesh 10/31/2023   History of Left Renal cell cancer   Hyponatremia   Acute respiratory failure with hypoxia (HCC)   10/31/2023    POST-OPERATIVE DIAGNOSIS:  VENTRAL INCISIONAL INCARCERATED HERNIA  Dimensions of hernia post-op:  15cm x 7cm   PROCEDURE:   ROBOTIC & LAPAROSCOPIC REPAIR OF ABDOMINAL HERNIA WITH MESH  (See OR Findings below) TAP BLOCK - BILATERAL   SURGEON:  Ardeth Sportsman, MD  OR FINDINGS: Large supraumbilical incisional hernia incarcerated with a foot of transverse colon and greater omentum.  Swiss cheese hernias going down infraumbilically connecting with his prior low midline incision as well.  Umbilical hernia.   Type of ventral wall repair:  Laparoscopic underlay repair .with Primary repair of largest  hernia Placement of mesh: Centrally intraperitoneal with edges tucked into RECTRORECTUS & preperitoneal space Name of mesh: Bard Ventralight dual sided (polypropylene / Seprafilm) Size of mesh: 33x27cm Orientation: Vertical Mesh overlap:  5-7cm  10/31/2023   Postoperative diagnosis: Right renal neoplasm   Procedure:   Right robotic-assisted laparoscopic partial nephrectomy Intraoperative renal ultrasonography   Surgeon: Moody Bruins. M.D.   Intraoperative findings:       1. Warm renal ischemia time: 22 minutes       2. Intraoperative renal ultrasound findings: There was a small cystic appearing mass that measured about 1 cm adjacent to a larger renal cyst.   Drains: # 15 Blake perinephric drain  Assessment [facility] Stay = 2 days) 4 Days Post-Op    Some delirium from alcohol withdrawal and hypoxia.   Plan:  -ERAS protocol  Falling into delirium with some confusion.  Agree with CIWA protocol/alcohol withdrawal protocol.  That will be a challenge.  Solid diet as tolerated.  Aggressive fiber regimen to avoid constipation.  Will give extra dose of lactulose and add MiraLAX to help out since he likes his narcotics  Multimodal pain control.  Challenging this obese male with chronic pain issues.  Back on his Cymbalta, BuSpar, and gabapentin.  Continue scheduled Tylenol.  Oral narcotics and muscle relaxants.  IV for backup.  He likes his Dilaudid.  Given everything's been through, it is not surprising he needs some stronger pain meds at first but hopefully can wean off.  He does not seem to be having severe abdominal pain that would make him breathe less and had good cough today.  Pulmonary challenging this morbidly obese smoker with numerous health issues.  Internal medicine help appreciated.  Albuterol as needed.  Hopefully can keep on the dry side without stressing his kidneys too much  Foley catheter and drain out per urology team.   I removed the remaining dressings  he does not need them anymore.  Can replace PRN (as needed)  -monitor electrolytes & replace as needed  Keep K>4, Mg>2, Phos>3  -VTE prophylaxis- SCDs.  Anticoagulation prophyllaxis SQ as appropriate.  Okay to fully anticoagulated if there are concerns as long as hemoglobin monitored  -mobilize as tolerated to help recovery.  HE NEEDS TO GET UP!  Enlist therapies in moderate/high risk patients as appropriate.  Nursing understandably guarded and trying to mobilize him given his morbid obesity with back and deconditioned and neuropathy issues.  Will see if physical therapy can be involved once urology feels it is safe to for the patient to mobilize.  I updated the patient's status to the patient and nurse  Recommendations were made.  Questions were answered.  They expressed understanding & appreciation.  -Disposition: Stable to go home from a general surgery standpoint but understandably need to wait until his delirium and hypoxia resolves.  Will follow more peripherally    I reviewed nursing notes, last 24 h vitals and pain scores, last 48 h intake and output, last 24 h labs and trends, and last 24 h imaging results.  I have reviewed this patient's available data, including medical history, events of note, test results, etc as part of my evaluation.   A significant portion of that time was spent in counseling. Care during the described time interval was provided by me.  This care required moderate level of medical decision making.  11/04/2023    Subjective: (Chief complaint)  Patient sleeping.  A little confused.  Denies much abdominal pain.  Had been eating yesterday.  No nausea or vomiting per patient or recorded  Objective:  Vital signs:  Vitals:   11/04/23 0100 11/04/23 0200 11/04/23 0337 11/04/23 0402  BP:  139/87 139/87 139/87  Pulse: 79 77 80 80  Resp:   16   Temp:  98.4 F (36.9 C) 98.4 F (36.9 C)   TempSrc:  Oral Oral   SpO2:   94%   Weight:      Height:         Last BM Date :  (pre-op)  Intake/Output   Yesterday:  04/06 0701 - 04/07 0700 In: 949.5 [P.O.:120; I.V.:829.5] Out: 1650 [Urine:1650] This shift:  No intake/output data recorded.  Bowel function:  Flatus: YES  BM:  No  Drain: (No drain)   Physical Exam:  General: Pt resting but awakens to be alert in no acute distress Eyes: PERRL, normal EOM.  Sclera clear.  No icterus Neuro: CN II-XII intact w/o focal sensory/motor deficits. Lymph: No head/neck/groin lymphadenopathy Psych:  No Psychosis/paranoia.  Oriented x 2   easily confused.  It took a while for him to reorient that he is in the hospital and he had surgery.  Not agitated HENT: Normocephalic, Mucus membranes moist.  No thrush Neck: Supple, No tracheal deviation.  No obvious thyromegaly Chest: No pain to chest wall compression.  Good respiratory excursion.  No audible wheezing but using abdominal wall to breathe.  Minimal conversational dyspnea. CV:  Pulses intact.  Regular rhythm.  No major extremity edema MS: Normal AROM mjr joints.  No obvious deformity  Abdomen: Soft.  Nondistended.  Nontender.  Moderate ecchymosis near  incision sites.  I removed all the remaining dressings and Steri-Strips.  No active bleeding or oozing.  Leaving everything open to air for now. No evidence of peritonitis.  No incarcerated hernias.  Ext:   No deformity.  No mjr edema.  No cyanosis Skin: No petechiae / purpurea.  No major sores.  Warm and dry    Results:   Cultures: No results found for this or any previous visit (from the past 720 hours).  Labs: Results for orders placed or performed during the hospital encounter of 10/31/23 (from the past 48 hours)  Glucose, capillary     Status: Abnormal   Collection Time: 11/02/23 12:09 PM  Result Value Ref Range   Glucose-Capillary 176 (H) 70 - 99 mg/dL    Comment: Glucose reference range applies only to samples taken after fasting for at least 8 hours.  CBC     Status: Abnormal    Collection Time: 11/02/23  2:09 PM  Result Value Ref Range   WBC 16.2 (H) 4.0 - 10.5 K/uL   RBC 3.50 (L) 4.22 - 5.81 MIL/uL   Hemoglobin 11.1 (L) 13.0 - 17.0 g/dL   HCT 29.5 (L) 62.1 - 30.8 %   MCV 102.0 (H) 80.0 - 100.0 fL   MCH 31.7 26.0 - 34.0 pg   MCHC 31.1 30.0 - 36.0 g/dL   RDW 65.7 84.6 - 96.2 %   Platelets 228 150 - 400 K/uL   nRBC 0.0 0.0 - 0.2 %    Comment: Performed at [facility], [address] [place], [place] [address]  Comprehensive metabolic panel     Status: Abnormal   Collection Time: 11/02/23  2:09 PM  Result Value Ref Range   Sodium 124 (L) 135 - 145 mmol/L   Potassium 5.1 3.5 - 5.1 mmol/L   Chloride 89 (L) 98 - 111 mmol/L   CO2 23 22 - 32 mmol/L   Glucose, Bld 158 (H) 70 - 99 mg/dL    Comment: Glucose reference range applies only to samples taken after fasting for at least 8 hours.   BUN 30 (H) 6 - 20 mg/dL   Creatinine, Ser 1.32 (H) 0.61 - 1.24 mg/dL    Comment: DELTA CHECK NOTED   Calcium 8.4 (L) 8.9 - 10.3 mg/dL   Total Protein 6.9 6.5 - 8.1 g/dL   Albumin 3.5 3.5 - 5.0 g/dL   AST 64 (H) 15 - 41 U/L   ALT 26 0 - 44 U/L   Alkaline Phosphatase 59 38 - 126 U/L   Total Bilirubin 0.7 0.0 - 1.2 mg/dL   GFR, Estimated 26 (L) >60 mL/min    Comment: (NOTE) Calculated using the CKD-EPI Creatinine Equation (2021)    Anion gap 12 5 - 15    Comment: Performed at [facility], [address], [place], [place] [address]  Magnesium     Status: Abnormal   Collection Time: 11/02/23  2:09 PM  Result Value Ref Range   Magnesium 2.5 (H) 1.7 - 2.4 mg/dL    Comment: Performed at [facility], [address] [place], [place] [address]  Phosphorus     Status: None   Collection Time: 11/02/23  2:09 PM  Result Value Ref Range   Phosphorus 4.0 2.5 - 4.6 mg/dL    Comment: Performed at [facility], [address] [place], [place] [address]  Brain natriuretic peptide     Status: None    Collection Time: 11/02/23  2:09 PM  Result Value Ref Range   B Natriuretic Peptide 90.6 0.0 - 100.0 pg/mL    Comment: Performed at [facility], [address], [place], [place] [address]  Glucose, capillary     Status: Abnormal   Collection Time: 11/02/23  5:56 PM  Result Value Ref Range   Glucose-Capillary 252 (H) 70 - 99 mg/dL    Comment: Glucose reference range applies only to samples taken after fasting for at least 8 hours.  Glucose, capillary     Status: Abnormal   Collection Time: 11/02/23  8:00 PM  Result Value Ref Range   Glucose-Capillary 233 (H) 70 - 99 mg/dL    Comment: Glucose reference range applies only to samples taken after fasting for at least 8 hours.  Glucose, capillary     Status: Abnormal   Collection Time: 11/03/23 12:08 AM  Result Value Ref Range   Glucose-Capillary 199 (H) 70 - 99 mg/dL    Comment: Glucose reference range applies only to samples taken after fasting for at least 8 hours.  Glucose, capillary     Status: Abnormal   Collection Time: 11/03/23  4:23 AM  Result Value Ref Range   Glucose-Capillary 164 (H) 70 - 99 mg/dL    Comment: Glucose reference range applies only to samples taken after fasting for at least 8 hours.  Glucose, capillary     Status: Abnormal   Collection Time: 11/03/23  7:45 AM  Result Value Ref Range   Glucose-Capillary 149 (H) 70 - 99 mg/dL    Comment: Glucose reference range applies only to samples taken after fasting for at least 8 hours.  CBC     Status: Abnormal   Collection Time: 11/03/23  8:44 AM  Result Value Ref Range   WBC 16.5 (H) 4.0 - 10.5 K/uL   RBC 3.31 (L) 4.22 - 5.81 MIL/uL   Hemoglobin 10.4 (L) 13.0 - 17.0 g/dL   HCT 98.1 (L) 19.1 - 47.8 %   MCV 97.6 80.0 - 100.0 fL   MCH 31.4 26.0 - 34.0 pg   MCHC 32.2 30.0 - 36.0 g/dL   RDW 29.5 62.1 - 30.8 %   Platelets 224 150 - 400 K/uL   nRBC 0.0 0.0 - 0.2 %    Comment: Performed at [facility], [address]  [place], [place] [address]  Basic metabolic panel     Status: Abnormal   Collection Time: 11/03/23  8:44 AM  Result Value Ref Range   Sodium 128 (L) 135 - 145 mmol/L   Potassium 4.7 3.5 - 5.1 mmol/L   Chloride 94 (L) 98 - 111 mmol/L   CO2 24 22 - 32 mmol/L   Glucose, Bld 151 (H) 70 - 99 mg/dL    Comment: Glucose reference range applies only to samples taken after fasting for at least 8 hours.   BUN 35 (H) 6 - 20 mg/dL   Creatinine, Ser 6.96 (H) 0.61 - 1.24 mg/dL   Calcium 8.5 (L) 8.9 - 10.3 mg/dL   GFR, Estimated 43 (L) >60 mL/min    Comment: (NOTE) Calculated using the CKD-EPI Creatinine Equation (2021)    Anion gap 10 5 - 15    Comment: Performed at [facility], [address] [place], [place] [address]  Troponin I (High Sensitivity)     Status: None   Collection Time: 11/03/23  8:44 AM  Result Value Ref Range   Troponin I (High Sensitivity) 8 <18 ng/L  Comment: (NOTE) Elevated high sensitivity troponin I (hsTnI) values and significant  changes across serial measurements may suggest ACS but many other  chronic and acute conditions are known to elevate hsTnI results.  Refer to the "Links" section for chest pain algorithms and additional  guidance. Performed at [facility], [address], [place], [place] [address]   Troponin I (High Sensitivity)     Status: None   Collection Time: 11/03/23 10:20 AM  Result Value Ref Range   Troponin I (High Sensitivity) 6 <18 ng/L    Comment: (NOTE) Elevated high sensitivity troponin I (hsTnI) values and significant  changes across serial measurements may suggest ACS but many other  chronic and acute conditions are known to elevate hsTnI results.  Refer to the "Links" section for chest pain algorithms and additional  guidance. Performed at [facility], [address] [place], [place] [address]   Glucose, capillary     Status: Abnormal   Collection Time: 11/03/23 11:53 AM   Result Value Ref Range   Glucose-Capillary 138 (H) 70 - 99 mg/dL    Comment: Glucose reference range applies only to samples taken after fasting for at least 8 hours.  Glucose, capillary     Status: Abnormal   Collection Time: 11/03/23  4:15 PM  Result Value Ref Range   Glucose-Capillary 303 (H) 70 - 99 mg/dL    Comment: Glucose reference range applies only to samples taken after fasting for at least 8 hours.  Glucose, capillary     Status: Abnormal   Collection Time: 11/03/23  8:12 PM  Result Value Ref Range   Glucose-Capillary 216 (H) 70 - 99 mg/dL    Comment: Glucose reference range applies only to samples taken after fasting for at least 8 hours.  Glucose, capillary     Status: Abnormal   Collection Time: 11/04/23 12:14 AM  Result Value Ref Range   Glucose-Capillary 138 (H) 70 - 99 mg/dL    Comment: Glucose reference range applies only to samples taken after fasting for at least 8 hours.  Basic metabolic panel with GFR     Status: Abnormal   Collection Time: 11/04/23  3:40 AM  Result Value Ref Range   Sodium 132 (L) 135 - 145 mmol/L   Potassium 5.2 (H) 3.5 - 5.1 mmol/L   Chloride 98 98 - 111 mmol/L   CO2 24 22 - 32 mmol/L   Glucose, Bld 176 (H) 70 - 99 mg/dL    Comment: Glucose reference range applies only to samples taken after fasting for at least 8 hours.   BUN 32 (H) 6 - 20 mg/dL   Creatinine, Ser 0.98 (H) 0.61 - 1.24 mg/dL   Calcium 9.0 8.9 - 11.9 mg/dL   GFR, Estimated >14 >78 mL/min    Comment: (NOTE) Calculated using the CKD-EPI Creatinine Equation (2021)    Anion gap 10 5 - 15    Comment: Performed at [facility], [address] [place], [place] [address]  CBC     Status: Abnormal   Collection Time: 11/04/23  3:40 AM  Result Value Ref Range   WBC 17.8 (H) 4.0 - 10.5 K/uL   RBC 3.13 (L) 4.22 - 5.81 MIL/uL   Hemoglobin 10.0 (L) 13.0 - 17.0 g/dL   HCT 13.0 (L) 86.5 - 78.4 %   MCV 101.9 (H) 80.0 - 100.0 fL   MCH 31.9 26.0 - 34.0 pg   MCHC  31.3 30.0 - 36.0 g/dL  RDW 13.2 11.5 - 15.5 %   Platelets 263 150 - 400 K/uL   nRBC 0.0 0.0 - 0.2 %    Comment: Performed at [facility], [address], [place], [place] [address]  Glucose, capillary     Status: Abnormal   Collection Time: 11/04/23  4:09 AM  Result Value Ref Range   Glucose-Capillary 184 (H) 70 - 99 mg/dL    Comment: Glucose reference range applies only to samples taken after fasting for at least 8 hours.  Glucose, capillary     Status: Abnormal   Collection Time: 11/04/23  7:27 AM  Result Value Ref Range   Glucose-Capillary 181 (H) 70 - 99 mg/dL    Comment: Glucose reference range applies only to samples taken after fasting for at least 8 hours.    Imaging / Studies: VAS [place] LOWER EXTREMITY VENOUS (DVT) Result Date: 11/03/2023  Lower Venous DVT Study Patient Name:  JEOVANY HUITRON  Date of Exam:   11/03/2023 Medical Rec #: [id number]        Accession #:    [id number] Date of Birth: [birth year]        Patient Gender: M Patient Age:   53 years Exam Location:  [facility] Procedure:      VAS [place] LOWER EXTREMITY VENOUS (DVT) Referring Phys: RAMESH KC --------------------------------------------------------------------------------  Indications: SOB, and Edema.  Risk Factors: Surgery Hernia repair and partial right nephrectomy (10/31/23) Hx of left renal cell cancer, recent diagnosis of right renal cell cancer. Comparison Study: Previous exam on 03/13/2015 was negative for DVT Performing Technologist: Ernestene Mention RVT, RDMS  Examination Guidelines: A complete evaluation includes B-mode imaging, spectral Doppler, color Doppler, and power Doppler as needed of all accessible portions of each vessel. Bilateral testing is considered an integral part of a complete examination. Limited examinations for reoccurring indications may be performed as noted. The reflux portion of the exam is performed with the patient in reverse Trendelenburg.   +---------+---------------+---------+-----------+----------+--------------+ RIGHT    CompressibilityPhasicitySpontaneityPropertiesThrombus Aging +---------+---------------+---------+-----------+----------+--------------+ CFV      Full           Yes      Yes                                 +---------+---------------+---------+-----------+----------+--------------+ SFJ      Full                                                        +---------+---------------+---------+-----------+----------+--------------+ FV Prox  Full           Yes      Yes                                 +---------+---------------+---------+-----------+----------+--------------+ FV Mid   Full           Yes      Yes                                 +---------+---------------+---------+-----------+----------+--------------+ FV DistalFull           Yes      Yes                                 +---------+---------------+---------+-----------+----------+--------------+  PFV      Full                                                        +---------+---------------+---------+-----------+----------+--------------+ POP      Full           Yes      Yes                                 +---------+---------------+---------+-----------+----------+--------------+ PTV      Full                                                        +---------+---------------+---------+-----------+----------+--------------+ PERO     Full                                                        +---------+---------------+---------+-----------+----------+--------------+   +---------+---------------+---------+-----------+----------+--------------+ LEFT     CompressibilityPhasicitySpontaneityPropertiesThrombus Aging +---------+---------------+---------+-----------+----------+--------------+ CFV      Full           Yes      Yes                                  +---------+---------------+---------+-----------+----------+--------------+ SFJ      Full                                                        +---------+---------------+---------+-----------+----------+--------------+ FV Prox  Full           Yes      Yes                                 +---------+---------------+---------+-----------+----------+--------------+ FV Mid   Full           Yes      Yes                                 +---------+---------------+---------+-----------+----------+--------------+ FV DistalFull           Yes      Yes                                 +---------+---------------+---------+-----------+----------+--------------+ PFV      Full                                                        +---------+---------------+---------+-----------+----------+--------------+  POP      Full           Yes      Yes                                 +---------+---------------+---------+-----------+----------+--------------+ PTV      Full                                                        +---------+---------------+---------+-----------+----------+--------------+ PERO     Full                                                        +---------+---------------+---------+-----------+----------+--------------+    Summary: BILATERAL: - No evidence of deep vein thrombosis seen in the lower extremities, bilaterally. -No evidence of popliteal cyst, bilaterally.   *See table(s) above for measurements and observations.    Preliminary    DG Abd Portable 1V Result Date: 11/03/2023 CLINICAL DATA: Constipation EXAM: PORTABLE ABDOMEN - 1 VIEW COMPARISON:  CT 08/06/2023 FINDINGS: Several mildly dilated small bowel loops in the mid abdomen, possibly in the area of ventral hernia seen on prior CT. Cannot exclude small bowel obstruction. Gas is seen within nondistended colon. No free air, organomegaly or suspicious calcification. IMPRESSION: Mildly prominent mid  abdominal small bowel loops. Cannot exclude small bowel obstruction, particularly given the ventral hernia seen on prior CT. Electronically Signed   By: Charlett Nose M.D.   On: 11/03/2023 12:16   ECHOCARDIOGRAM COMPLETE Result Date: 11/02/2023    ECHOCARDIOGRAM REPORT   Patient Name:   KEYAN FOLSON Date of Exam: 11/02/2023 Medical Rec #:  [id number]       Height:       75.0 in Accession #:    [id number]      Weight:       297.6 lb Date of Birth:  [birth year]       BSA:          2.598 m Patient Age:    52 years        BP:           112/70 mmHg Patient Gender: M               HR:           88 bpm. Exam Location:  Inpatient Procedure: 2D Echo, Cardiac Doppler, Color Doppler and Intracardiac            Opacification Agent (Both Spectral and Color Flow Doppler were            utilized during procedure). 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 CHF, Signs/Symptoms:Syncope; Risk                 Factors:Current Smoker, Dyslipidemia  and Hypertension. ETOH.                 Cancer.  Sonographer:    Sheralyn Boatman RDCS Referring Phys: [id number] DAVID MANUEL ORTIZ  Sonographer Comments: Technically difficult study due to poor echo windows and patient is obese. Image acquisition challenging due to patient body habitus. IMPRESSIONS  1. Difficult acoustic windows limit study even with Definity used.  2. Difficult acoustic windows Definity used Basal inferior/inferoseptal hypokinesis. . Left ventricular ejection fraction, by estimation, is 50 to 55%. The left ventricle has low normal function. The left ventricular internal cavity size was mildly dilated. Left ventricular diastolic parameters were normal.  3. Right ventricular systolic function is mildly reduced. The right ventricular size is mildly enlarged. Tricuspid regurgitation  signal is inadequate for assessing PA pressure.  4. The mitral valve is grossly normal. Trivial mitral valve regurgitation.  5. The aortic valve is tricuspid. Aortic valve regurgitation is not visualized.  6. The inferior vena cava is normal in size with greater than 50% respiratory variability, suggesting right atrial pressure of 3 mmHg. FINDINGS  Left Ventricle: Difficult acoustic windows Definity used Basal inferior/inferoseptal hypokinesis. Left ventricular ejection fraction, by estimation, is 50 to 55%. The left ventricle has low normal function. Definity contrast agent was given IV to delineate the left ventricular endocardial borders. The left ventricular internal cavity size was mildly dilated. There is no left ventricular hypertrophy. Left ventricular diastolic parameters were normal. Right Ventricle: The right ventricular size is mildly enlarged. Right vetricular wall thickness was not assessed. Right ventricular systolic function is mildly reduced. Tricuspid regurgitation signal is inadequate for assessing PA pressure. Left Atrium: Left atrial size was normal in size. Right Atrium: Right atrial size was normal in size. Pericardium: There is no evidence of pericardial effusion. Mitral Valve: The mitral valve is grossly normal. Trivial mitral valve regurgitation. Tricuspid Valve: The tricuspid valve is normal in structure. Tricuspid valve regurgitation is trivial. Aortic Valve: The aortic valve is tricuspid. Aortic valve regurgitation is not visualized. Pulmonic Valve: The pulmonic valve was normal in structure. Pulmonic valve regurgitation is not visualized. Aorta: The aortic root and ascending aorta are structurally normal, with no evidence of dilitation. Venous: The inferior vena cava is normal in size with greater than 50% respiratory variability, suggesting right atrial pressure of 3 mmHg. IAS/Shunts: No atrial level shunt detected by color flow Doppler.  LEFT VENTRICLE PLAX 2D LVIDd:         5.60 cm       Diastology LVIDs:         4.10 cm      LV e' medial:    7.29 cm/s LV PW:         1.00 cm      LV E/e' medial:  10.1 LV IVS:        1.00 cm      LV e' lateral:   10.20 cm/s                             LV E/e' lateral: 7.2  LV Volumes (MOD) LV vol d, MOD A2C: 121.0 ml LV vol d, MOD A4C: 143.0 ml LV vol s, MOD A2C: 59.1 ml LV vol s, MOD A4C: 67.3 ml LV SV MOD A2C:     61.9 ml LV SV MOD A4C:     143.0 ml LV SV MOD BP:      67.5 ml RIGHT VENTRICLE  IVC RV S prime:     16.10 cm/s  IVC diam: 1.40 cm TAPSE (M-mode): 1.9 cm LEFT ATRIUM             Index        RIGHT ATRIUM           Index LA diam:        4.10 cm 1.58 cm/m   RA Area:     20.30 cm LA Vol (A2C):   41.9 ml 16.13 ml/m  RA Volume:   69.90 ml  26.90 ml/m LA Vol (A4C):   43.4 ml 16.70 ml/m LA Biplane Vol: 44.4 ml 17.09 ml/m  AORTIC VALVE LVOT Vmax:   104.00 cm/s LVOT Vmean:  68.800 cm/s LVOT VTI:    0.159 m  AORTA Ao Root diam: 3.80 cm Ao Asc diam:  3.90 cm MITRAL VALVE MV Area (PHT): 3.84 cm    SHUNTS MV Decel Time: 198 msec    Systemic VTI: 0.16 m MV E velocity: 73.80 cm/s MV A velocity: 72.85 cm/s MV E/A ratio:  1.01 Dietrich Pates MD Electronically signed by Dietrich Pates MD Signature Date/Time: 11/02/2023/7:50:48 PM    Final    DG Chest 1 View Result Date: 11/02/2023 CLINICAL DATA:  Hypoxia.  Recent nephrectomy. EXAM: CHEST  1 VIEW COMPARISON:  12/16/2020 FINDINGS: Stable heart size and mediastinal contours. Streaky opacities in the lung bases typical of atelectasis. There is moderate perihilar peribronchial thickening. No pneumothorax or significant pleural effusion. On limited assessment, no acute osseous findings. IMPRESSION: 1. Moderate perihilar peribronchial thickening, can be seen with bronchitis or reactive airways disease. 2. Bibasilar atelectasis. Electronically Signed   By: Narda Rutherford M.D.   On: 11/02/2023 17:07    Medications / Allergies: per chart  Antibiotics: Anti-infectives (From admission, onward)    Start      Dose/Rate Route Frequency Ordered Stop   10/31/23 2000  ceFAZolin (ANCEF) IVPB 1 g/50 mL premix        1 g 100 mL/hr over 30 Minutes Intravenous Every 8 hours 10/31/23 1606 11/01/23 0449   10/31/23 0600  ceFAZolin (ANCEF) IVPB 2g/100 mL premix  Status:  Discontinued        2 g 200 mL/hr over 30 Minutes Intravenous On call to O.R. 10/31/23 0536 10/31/23 0538   10/31/23 0536  ceFAZolin (ANCEF) IVPB 2g/100 mL premix        2 g 200 mL/hr over 30 Minutes Intravenous 30 min pre-op 10/31/23 0536 10/31/23 1127         Note: Portions of this report may have been transcribed using voice recognition software. Every effort was made to ensure accuracy; however, inadvertent computerized transcription errors may be present.   Any transcriptional errors that result from this process are unintentional.    Ardeth Sportsman, MD, FACS, MASCRS Esophageal, Gastrointestinal & Colorectal Surgery Robotic and Minimally Invasive Surgery  [facility] [address] [place], [place] [address] [phone] Fax [phone] Main  CONTACT INFORMATION: Weekday (9AM-5PM): Call [facility] main office at [phone] Weeknight (5PM-9AM) or Weekend/Holiday: Check EPIC "Web Links" tab & use "AMION" (password " [email or web address]") for General Surgery [facility] coverage  Please, DO NOT use SecureChat  (it is not reliable communication to reach operating surgeons & will lead to a delay in care).   Epic staff messaging available for outptient concerns needing 1-2 business day response.      11/04/2023  8:08 AM

## 2023-11-05 ENCOUNTER — Inpatient Hospital Stay (HOSPITAL_COMMUNITY)

## 2023-11-05 DIAGNOSIS — D62 Acute posthemorrhagic anemia: Secondary | ICD-10-CM | POA: Diagnosis not present

## 2023-11-05 DIAGNOSIS — J9601 Acute respiratory failure with hypoxia: Secondary | ICD-10-CM | POA: Diagnosis not present

## 2023-11-05 DIAGNOSIS — Z720 Tobacco use: Secondary | ICD-10-CM | POA: Diagnosis not present

## 2023-11-05 DIAGNOSIS — F10931 Alcohol use, unspecified with withdrawal delirium: Secondary | ICD-10-CM | POA: Diagnosis not present

## 2023-11-05 DIAGNOSIS — J80 Acute respiratory distress syndrome: Secondary | ICD-10-CM

## 2023-11-05 LAB — BASIC METABOLIC PANEL WITH GFR
Anion gap: 7 (ref 5–15)
BUN: 30 mg/dL — ABNORMAL HIGH (ref 6–20)
CO2: 29 mmol/L (ref 22–32)
Calcium: 9.3 mg/dL (ref 8.9–10.3)
Chloride: 104 mmol/L (ref 98–111)
Creatinine, Ser: 1.01 mg/dL (ref 0.61–1.24)
GFR, Estimated: >60 mL/min (ref >60)
Glucose, Bld: 171 mg/dL — ABNORMAL HIGH (ref 70–99)
Potassium: 4.8 mmol/L (ref 3.5–5.1)
Sodium: 140 mmol/L (ref 135–145)

## 2023-11-05 LAB — CBC
HCT: 33.3 % — ABNORMAL LOW (ref 39.0–52.0)
Hemoglobin: 10.4 g/dL — ABNORMAL LOW (ref 13.0–17.0)
MCH: 31.9 pg (ref 26.0–34.0)
MCHC: 31.2 g/dL (ref 30.0–36.0)
MCV: 102.1 fL — ABNORMAL HIGH (ref 80.0–100.0)
Platelets: 295 10*3/uL (ref 150–400)
RBC: 3.26 MIL/uL — ABNORMAL LOW (ref 4.22–5.81)
RDW: 13.2 % (ref 11.5–15.5)
WBC: 14.5 10*3/uL — ABNORMAL HIGH (ref 4.0–10.5)
nRBC: 0 % (ref 0.0–0.2)

## 2023-11-05 LAB — BLOOD GAS, ARTERIAL
Acid-Base Excess: 4.6 mmol/L — ABNORMAL HIGH (ref 0.0–2.0)
Acid-Base Excess: 4.7 mmol/L — ABNORMAL HIGH (ref 0.0–2.0)
Acid-Base Excess: 5.6 mmol/L — ABNORMAL HIGH (ref 0.0–2.0)
Bicarbonate: 31.1 mmol/L — ABNORMAL HIGH (ref 20.0–28.0)
Bicarbonate: 29.2 mmol/L — ABNORMAL HIGH (ref 20.0–28.0)
Bicarbonate: 30.6 mmol/L — ABNORMAL HIGH (ref 20.0–28.0)
Drawn by: 51133
Drawn by: 25770
Drawn by: 25770
FIO2: 100 %
FIO2: 70 %
FIO2: 100 %
MECHVT: 670 mL
MECHVT: 670 mL
MECHVT: 500 mL
O2 Saturation: 98.5 %
O2 Saturation: 98.9 %
O2 Saturation: 100 %
PEEP: 8 cmH2O
PEEP: 8 cmH2O
PEEP: 10 cmH2O
Patient temperature: 37.5
Patient temperature: 38.4
Patient temperature: 38.4
RATE: 18 {breaths}/min
RATE: 18 {breaths}/min
RATE: 22 {breaths}/min
pCO2 arterial: 56 mmHg — ABNORMAL HIGH (ref 32–48)
pCO2 arterial: 45 mmHg (ref 32–48)
pCO2 arterial: 48 mmHg (ref 32–48)
pH, Arterial: 7.35 (ref 7.35–7.45)
pH, Arterial: 7.43 (ref 7.35–7.45)
pH, Arterial: 7.42 (ref 7.35–7.45)
pO2, Arterial: 87 mmHg (ref 83–108)
pO2, Arterial: 84 mmHg (ref 83–108)
pO2, Arterial: 304 mmHg — ABNORMAL HIGH (ref 83–108)

## 2023-11-05 LAB — GLUCOSE, CAPILLARY
Glucose-Capillary: 134 mg/dL — ABNORMAL HIGH (ref 70–99)
Glucose-Capillary: 142 mg/dL — ABNORMAL HIGH (ref 70–99)
Glucose-Capillary: 154 mg/dL — ABNORMAL HIGH (ref 70–99)
Glucose-Capillary: 159 mg/dL — ABNORMAL HIGH (ref 70–99)
Glucose-Capillary: 177 mg/dL — ABNORMAL HIGH (ref 70–99)
Glucose-Capillary: 178 mg/dL — ABNORMAL HIGH (ref 70–99)
Glucose-Capillary: 178 mg/dL — ABNORMAL HIGH (ref 70–99)

## 2023-11-05 LAB — AMMONIA: Ammonia: 42 umol/L — ABNORMAL HIGH (ref 9–35)

## 2023-11-05 LAB — BRAIN NATRIURETIC PEPTIDE: B Natriuretic Peptide: 450.2 pg/mL — ABNORMAL HIGH (ref 0.0–100.0)

## 2023-11-05 MED ORDER — FENTANYL CITRATE PF 50 MCG/ML IJ SOSY
50.0000 ug | PREFILLED_SYRINGE | INTRAMUSCULAR | Status: DC | PRN
  Administered 2023-11-05: 50 ug via INTRAVENOUS
  Administered 2023-11-05: 100 ug via INTRAVENOUS
  Administered 2023-11-05: 50 ug via INTRAVENOUS
  Filled 2023-11-05: qty 1
  Filled 2023-11-05 (×3): qty 2

## 2023-11-05 MED ORDER — LOPERAMIDE HCL 1 MG/7.5ML PO SUSP: 2.0000 mg | ORAL | Status: DC | PRN

## 2023-11-05 MED ORDER — AMLODIPINE BESYLATE 10 MG PO TABS
10.0000 mg | ORAL_TABLET | Freq: Every day | ORAL | Status: DC
  Administered 2023-11-05 – 2023-11-11 (×7): 10 mg
  Filled 2023-11-05 (×7): qty 1

## 2023-11-05 MED ORDER — PROPOFOL 1000 MG/100ML IV EMUL
0.0000 ug/kg/min | INTRAVENOUS | Status: DC
  Administered 2023-11-05: 60 ug/kg/min via INTRAVENOUS
  Administered 2023-11-05: 55 ug/kg/min via INTRAVENOUS
  Administered 2023-11-05: 55.062 ug/kg/min via INTRAVENOUS
  Administered 2023-11-05: 5 ug/kg/min via INTRAVENOUS
  Administered 2023-11-05: 45 ug/kg/min via INTRAVENOUS
  Administered 2023-11-05: 65 ug/kg/min via INTRAVENOUS
  Administered 2023-11-05: 75 ug/kg/min via INTRAVENOUS
  Administered 2023-11-05 (×2): 45 ug/kg/min via INTRAVENOUS
  Administered 2023-11-05: 55 ug/kg/min via INTRAVENOUS
  Administered 2023-11-06: 45 ug/kg/min via INTRAVENOUS
  Administered 2023-11-06: 35 ug/kg/min via INTRAVENOUS
  Administered 2023-11-06 (×3): 45 ug/kg/min via INTRAVENOUS
  Administered 2023-11-06: 35 ug/kg/min via INTRAVENOUS
  Administered 2023-11-06 – 2023-11-07 (×4): 45 ug/kg/min via INTRAVENOUS
  Administered 2023-11-07: 55 ug/kg/min via INTRAVENOUS
  Administered 2023-11-07: 45 ug/kg/min via INTRAVENOUS
  Filled 2023-11-05: qty 200
  Filled 2023-11-05: qty 100
  Filled 2023-11-05: qty 200
  Filled 2023-11-05 (×17): qty 100

## 2023-11-05 MED ORDER — FENTANYL BOLUS VIA INFUSION
50.0000 ug | INTRAVENOUS | Status: DC | PRN
  Administered 2023-11-05 – 2023-11-12 (×56): 100 ug via INTRAVENOUS
  Administered 2023-11-12: 50 ug via INTRAVENOUS
  Administered 2023-11-12 – 2023-11-14 (×11): 100 ug via INTRAVENOUS

## 2023-11-05 MED ORDER — GABAPENTIN 250 MG/5ML PO SOLN
900.0000 mg | Freq: Two times a day (BID) | ORAL | Status: DC
  Administered 2023-11-05 – 2023-11-09 (×10): 900 mg
  Filled 2023-11-05 (×11): qty 18

## 2023-11-05 MED ORDER — GUAIFENESIN-DM 100-10 MG/5ML PO SYRP: 15.0000 mL | ORAL_SOLUTION | ORAL | Status: DC | PRN

## 2023-11-05 MED ORDER — BUSPIRONE HCL 5 MG PO TABS
15.0000 mg | ORAL_TABLET | Freq: Two times a day (BID) | ORAL | Status: DC
  Administered 2023-11-05 – 2023-11-09 (×10): 15 mg
  Filled 2023-11-05 (×10): qty 1

## 2023-11-05 MED ORDER — ADULT MULTIVITAMIN LIQUID CH
15.0000 mL | Freq: Every day | ORAL | Status: DC
  Administered 2023-11-05 – 2023-11-12 (×8): 15 mL
  Filled 2023-11-05 (×8): qty 15

## 2023-11-05 MED ORDER — BISACODYL 5 MG PO TBEC
10.0000 mg | DELAYED_RELEASE_TABLET | ORAL | Status: DC
  Filled 2023-11-05: qty 2

## 2023-11-05 MED ORDER — FENTANYL 2500MCG IN NS 250ML (10MCG/ML) PREMIX INFUSION
50.0000 ug/h | INTRAVENOUS | Status: DC
  Administered 2023-11-05: 50 ug/h via INTRAVENOUS
  Administered 2023-11-06 – 2023-11-07 (×3): 175 ug/h via INTRAVENOUS
  Administered 2023-11-08: 150 ug/h via INTRAVENOUS
  Administered 2023-11-08: 200 ug/h via INTRAVENOUS
  Administered 2023-11-09: 150 ug/h via INTRAVENOUS
  Administered 2023-11-10: 50 ug/h via INTRAVENOUS
  Administered 2023-11-11: 100 ug/h via INTRAVENOUS
  Administered 2023-11-11 – 2023-11-13 (×4): 200 ug/h via INTRAVENOUS
  Administered 2023-11-13 – 2023-11-15 (×5): 300 ug/h via INTRAVENOUS
  Filled 2023-11-05 (×19): qty 250

## 2023-11-05 MED ORDER — IOHEXOL 350 MG/ML SOLN
75.0000 mL | Freq: Once | INTRAVENOUS | Status: AC | PRN
  Administered 2023-11-05: 75 mL via INTRAVENOUS

## 2023-11-05 MED ORDER — SODIUM CHLORIDE (PF) 0.9 % IJ SOLN
INTRAMUSCULAR | Status: AC
  Filled 2023-11-05: qty 50

## 2023-11-05 MED ORDER — ALLOPURINOL 100 MG PO TABS
300.0000 mg | ORAL_TABLET | Freq: Every day | ORAL | Status: DC
  Administered 2023-11-05 – 2023-11-12 (×8): 300 mg
  Filled 2023-11-05 (×9): qty 3

## 2023-11-05 MED ORDER — CHLORDIAZEPOXIDE HCL 25 MG PO CAPS
25.0000 mg | ORAL_CAPSULE | Freq: Every day | ORAL | Status: AC
  Administered 2023-11-08: 25 mg
  Filled 2023-11-05: qty 1

## 2023-11-05 MED ORDER — ORAL CARE MOUTH RINSE
15.0000 mL | OROMUCOSAL | Status: DC
Start: 2023-11-05 — End: 2023-11-06
  Administered 2023-11-05 – 2023-11-06 (×7): 15 mL via OROMUCOSAL

## 2023-11-05 MED ORDER — ATORVASTATIN CALCIUM 40 MG PO TABS
40.0000 mg | ORAL_TABLET | Freq: Every evening | ORAL | Status: DC
  Administered 2023-11-05 – 2023-11-12 (×7): 40 mg
  Filled 2023-11-05 (×7): qty 1

## 2023-11-05 MED ORDER — CLONAZEPAM 0.5 MG PO TBDP
1.0000 mg | ORAL_TABLET | Freq: Two times a day (BID) | ORAL | Status: DC
  Administered 2023-11-05 – 2023-11-06 (×4): 1 mg via ORAL
  Filled 2023-11-05 (×4): qty 2

## 2023-11-05 MED ORDER — FAMOTIDINE 20 MG PO TABS
20.0000 mg | ORAL_TABLET | Freq: Two times a day (BID) | ORAL | Status: DC
  Administered 2023-11-05 – 2023-11-12 (×15): 20 mg
  Filled 2023-11-05 (×16): qty 1

## 2023-11-05 MED ORDER — CHLORDIAZEPOXIDE HCL 25 MG PO CAPS: 25.0000 mg | ORAL_CAPSULE | Freq: Four times a day (QID) | ORAL | Status: AC | PRN

## 2023-11-05 MED ORDER — FUROSEMIDE 10 MG/ML IJ SOLN
60.0000 mg | Freq: Once | INTRAMUSCULAR | Status: AC
  Administered 2023-11-05: 60 mg via INTRAVENOUS
  Filled 2023-11-05: qty 6

## 2023-11-05 MED ORDER — FENTANYL CITRATE PF 50 MCG/ML IJ SOSY
50.0000 ug | PREFILLED_SYRINGE | Freq: Once | INTRAMUSCULAR | Status: AC
  Administered 2023-11-05: 50 ug via INTRAVENOUS
  Filled 2023-11-05: qty 1

## 2023-11-05 MED ORDER — CLONAZEPAM 0.1 MG/ML ORAL SUSPENSION: 1.0000 mg | Freq: Two times a day (BID) | ORAL | Status: DC

## 2023-11-05 MED ORDER — CALCIUM POLYCARBOPHIL 625 MG PO TABS
625.0000 mg | ORAL_TABLET | Freq: Two times a day (BID) | ORAL | Status: DC
  Administered 2023-11-05 – 2023-11-11 (×13): 625 mg
  Filled 2023-11-05 (×13): qty 1

## 2023-11-05 MED ORDER — CHLORDIAZEPOXIDE HCL 25 MG PO CAPS
25.0000 mg | ORAL_CAPSULE | Freq: Four times a day (QID) | ORAL | Status: AC
  Administered 2023-11-05 (×4): 25 mg
  Filled 2023-11-05 (×4): qty 1

## 2023-11-05 MED ORDER — DOCUSATE SODIUM 50 MG/5ML PO LIQD
50.0000 mg | ORAL | Status: AC
Start: 2023-11-05 — End: 2023-11-05
  Administered 2023-11-05: 50 mg
  Filled 2023-11-05: qty 10

## 2023-11-05 MED ORDER — MIDAZOLAM HCL 2 MG/2ML IJ SOLN
INTRAMUSCULAR | Status: AC
  Administered 2023-11-05: 4 mg
  Filled 2023-11-05: qty 4

## 2023-11-05 MED ORDER — FOLIC ACID 1 MG PO TABS
1.0000 mg | ORAL_TABLET | Freq: Every day | ORAL | Status: DC
  Administered 2023-11-05 – 2023-11-12 (×8): 1 mg
  Filled 2023-11-05 (×8): qty 1

## 2023-11-05 MED ORDER — PIPERACILLIN-TAZOBACTAM 3.375 G IVPB
3.3750 g | Freq: Three times a day (TID) | INTRAVENOUS | Status: DC
  Administered 2023-11-05 – 2023-11-08 (×10): 3.375 g via INTRAVENOUS
  Filled 2023-11-05 (×10): qty 50

## 2023-11-05 MED ORDER — FENTANYL CITRATE PF 50 MCG/ML IJ SOSY: 50.0000 ug | PREFILLED_SYRINGE | INTRAMUSCULAR | Status: DC | PRN

## 2023-11-05 MED ORDER — CHLORDIAZEPOXIDE HCL 25 MG PO CAPS
25.0000 mg | ORAL_CAPSULE | Freq: Three times a day (TID) | ORAL | Status: AC
  Administered 2023-11-06 (×2): 25 mg
  Filled 2023-11-05 (×3): qty 1

## 2023-11-05 MED ORDER — POLYETHYLENE GLYCOL 3350 17 G PO PACK
17.0000 g | PACK | Freq: Two times a day (BID) | ORAL | Status: AC
  Administered 2023-11-05 (×2): 17 g
  Filled 2023-11-05 (×2): qty 1

## 2023-11-05 MED ORDER — METOPROLOL TARTRATE 25 MG PO TABS
50.0000 mg | ORAL_TABLET | Freq: Two times a day (BID) | ORAL | Status: DC
  Administered 2023-11-05 – 2023-11-12 (×12): 50 mg
  Filled 2023-11-05 (×13): qty 2

## 2023-11-05 MED ORDER — ACETAMINOPHEN 160 MG/5ML PO SOLN
650.0000 mg | Freq: Four times a day (QID) | ORAL | Status: DC | PRN
  Administered 2023-11-05 – 2023-11-08 (×8): 650 mg
  Filled 2023-11-05 (×8): qty 20.3

## 2023-11-05 MED ORDER — CHLORDIAZEPOXIDE HCL 25 MG PO CAPS
25.0000 mg | ORAL_CAPSULE | ORAL | Status: AC
  Administered 2023-11-07 (×2): 25 mg
  Filled 2023-11-05 (×2): qty 1

## 2023-11-05 NOTE — Progress Notes (Signed)
 Pharmacy Antibiotic Note  Preston [name] is a 53 y.o. male admitted on 10/31/2023 for ventral incarcerated hernia repair and Right robotic-assisted laparoscopic partial nephrectomy on 4/3. Pharmacy has been consulted for zosyn dosing for aspiration PNA.  Plan: Zosyn 3.375g IV q8h (4 hour infusion). Pharmacy to sign off & f/u peripherally  Height: 6\' 3"  (190.5 cm) Weight: 135 kg (297 lb 9.9 oz) IBW/kg (Calculated) : 84.5  Temp (24hrs), Avg:99.1 F (37.3 C), Min:97.8 F (36.6 C), Max:100.5 F (38.1 C)  Recent Labs  Lab 10/29/23 1142 10/31/23 0644 11/02/23 1409 11/03/23 0844 11/04/23 0340 11/04/23 1210 11/05/23 0256  WBC 11.5*  --  16.2* 16.5* 17.8*  --  14.5*  CREATININE 0.76   < > 2.82* 1.87* 1.39* 1.08 1.01   < > = values in this interval not displayed.    Estimated Creatinine Clearance: 128.1 mL/min (by C-G formula based on SCr of 1.01 mg/dL).    Allergies  Allergen Reactions   Morphine Itching and Nausea And Vomiting   Ace Inhibitors Cough   Adhesive [Tape] Itching and Rash     Thank you for allowing pharmacy to be a part of this patient's care.  Herby Abraham, Pharm.D Use secure chat for questions 11/05/2023 8:02 AM

## 2023-11-05 NOTE — Progress Notes (Signed)
 Assisted with transporting PT while on ventilator (100% fi02) to [facility]- uneventful.

## 2023-11-05 NOTE — Progress Notes (Signed)
 11/05/2023  Preston [name] [id number] [birth year]  CARE TEAM: PCP: Claiborne Rigg, NP  Outpatient Care Team: Patient Care Team: Claiborne Rigg, NP as PCP - General (Nurse Practitioner) Jake Bathe, MD as PCP - Cardiology (Cardiology) Willis Modena, MD as Consulting Physician (Gastroenterology) Karie Soda, MD as Consulting Physician (General Surgery) Heloise Purpura, MD as Consulting Physician (Urology)  Inpatient Treatment Team: Treatment Team:  Heloise Purpura, MD Karie Soda, MD Harrie Foreman, [place], MD Jessie Foot, MD Lanae Boast, MD Pccm, Md, MD Berna Bue, RN Black, Christie Beckers, NT Herby Abraham, RPH Ulis Rias, RN Small, Ginette Otto, RN Anselm Pancoast, [place]   Problem List:   Principal Problem:   Neoplasm of right kidney Active Problems:   Tobacco abuse   HLD (hyperlipidemia)   Alcohol abuse   Chronic diastolic congestive heart failure (HCC)   Gastroesophageal reflux disease without esophagitis   Type 2 diabetes mellitus treated without insulin (HCC)   Essential hypertension   Acute kidney injury (HCC)   Obesity, Class II, BMI 35-39.9   Diastasis recti   Incarcerated incisional hernia s/p repair w mesh 10/31/2023   History of Left Renal cell cancer   Hyponatremia   Acute respiratory failure with hypoxia (HCC)   Alcohol withdrawal delirium (HCC)   ABLA (acute blood loss anemia)   Primary hypertension   10/31/2023    POST-OPERATIVE DIAGNOSIS:  VENTRAL INCISIONAL INCARCERATED HERNIA  Dimensions of hernia post-op:  15cm x 7cm   PROCEDURE:   ROBOTIC & LAPAROSCOPIC REPAIR OF ABDOMINAL HERNIA WITH MESH  (See OR Findings below) TAP BLOCK - BILATERAL   SURGEON:  Ardeth Sportsman, MD  OR FINDINGS: Large supraumbilical incisional hernia incarcerated with a foot of transverse colon and greater omentum.  Swiss cheese hernias going down infraumbilically connecting with his prior low midline incision as well.  Umbilical hernia.    Type of ventral wall repair:  Laparoscopic underlay repair .with Primary repair of largest hernia Placement of mesh: Centrally intraperitoneal with edges tucked into RECTRORECTUS & preperitoneal space Name of mesh: Bard Ventralight dual sided (polypropylene / Seprafilm) Size of mesh: 33x27cm Orientation: Vertical Mesh overlap:  5-7cm  10/31/2023   Postoperative diagnosis: Right renal neoplasm   Procedure:   Right robotic-assisted laparoscopic partial nephrectomy Intraoperative renal ultrasonography   Surgeon: Moody Bruins. M.D.   Intraoperative findings:       1. Warm renal ischemia time: 22 minutes       2. Intraoperative renal ultrasound findings: There was a small cystic appearing mass that measured about 1 cm adjacent to a larger renal cyst.   Drains: # 15 Blake perinephric drain  Assessment [facility] Stay = 3 days) 5 Days Post-Op    Some delirium from alcohol withdrawal and hypoxia.   Plan:  More aggressive alcohol withdrawal.  Continue protocol.  Using propofol or Precedex for now.  Defer to pulmonary critical care.  Ventilatory support.  Elevated BNP with question of infiltrates versus fluid raises concern for at least some respiratory failure with pulmonary edema and possible emphysema/COPD exacerbation.  I do not know if there really was not a witnessed aspiration event.  Hopefully can do some diuresis and keep on the dry side.  I think I would do bowel rest today with the gastric tube to low intermittent wall suction.  If he is not going to wake up and is expected to have a prolonged intubation, then can consider start tube feedings tomorrow.  Abdomen  with persistent moderate ecchymosis but seems to be stable to slightly resolving.  I can palpate a little mild bulging supraumbilically consistent with postoperative seroma/hematoma.  No evidence of cellulitis.  I would hold off on trying to open up her drain this.  I would not be surprised to see a little  bit of remaining gas and fluid collections this soon after surgery but I do not sense any intra-abdominal catastrophe at this time.  He has had bowel functions with no guarding or peritonitis.  Pain control per critical care.  Challenging this obese male with chronic pain issues.  Multimodal home regimen Cymbalta, BuSpar, and gabapentin.  .  Foley catheter for now.  Hopefully can remove once extubated.    monitor electrolytes & replace as needed  Keep K>4, Mg>2, Phos>3  -VTE prophylaxis- SCDs.  Anticoagulation prophyllaxis SQ as appropriate.  Okay to fully anticoagulate if there are concerns  I updated the patient's status to the patient's nurse  Recommendations were made.  Questions were answered.  She expressed understanding & appreciation.  -Disposition: TBD    I reviewed nursing notes, last 24 h vitals and pain scores, last 48 h intake and output, last 24 h labs and trends, and last 24 h imaging results.  I have reviewed this patient's available data, including medical history, events of note, test results, etc as part of my evaluation.   A significant portion of that time was spent in counseling. Care during the described time interval was provided by me.  This care required moderate level of medical decision making.  11/05/2023    Subjective: (Chief complaint)  Patient increasingly agitated confused by the evening.  Became more short of breath.  Hypoxic.    Declined to intubation on ventilator.  Transferred to ICU.  Not on any pressors.  Did not seem to have good response to diazepam and Precedex.  Now on propofol.  Objective:  Vital signs:  Vitals:   11/05/23 0200 11/05/23 0300 11/05/23 0400 11/05/23 0700  BP: (!) 159/84 (!) 176/93 (!) 170/88 139/81  Pulse: 96 73 72 76  Resp: (!) 21 15 (!) 22 (!) 21  Temp:  (!) 100.5 F (38.1 C)    TempSrc:  Axillary    SpO2: 99% 100% 100% 96%  Weight:      Height:        Last BM Date : 11/03/23  Intake/Output    Yesterday:  04/07 0701 - 04/08 0700 In: 1390.1 [P.O.:480; I.V.:910.1] Out: 2005 [Urine:2005] This shift:  No intake/output data recorded.  Bowel function:  Flatus: YES  BM:  No  Drain: (No drain)   Physical Exam:  General: Pt resting intubated and sedated.  no acute distress Eyes: PERRL  Sclera clear.  No icterus Neuro: No focal sensory/motor deficits. Lymph: No head/neck/groin lymphadenopathy Psych: Sedated at this point.  No more agitation/delirium  HENT: Normocephalic, Mucus membranes moist.  No thrush.  Orogastric tube just placed. Neck: Supple, No tracheal deviation.  No obvious thyromegaly Chest: No pain to chest wall compression.  Good respiratory excursion.  No audible wheezing but using abdominal wall to breathe.  Minimal conversational dyspnea. CV:  Pulses intact.  Regular rhythm.  No major extremity edema MS: Normal AROM mjr joints.  No obvious deformity  Abdomen: Obese Soft.  Mildy distended.  Nontender.  Moderate ecchymosis near incision sites, especially left side.  Stable to slightly resolving.  All dressings off.   Mild fullness at supraumbilical midline incision at epicenter of his large hernia  containing colon and.  Most likely consistent with postoperative hematoma/seroma.  No cellulitis or drainage.  Leave it alone for now.  No wincing or guarding to bed shake percussion.  No rebound tenderness.  No incarcerated hernias.  Ext:   No deformity.  No mjr edema.  No cyanosis Skin: No petechiae / purpurea.  No major sores.  Warm and dry    Results:   Cultures: Recent Results (from the past 720 hours)  MRSA Next Gen by PCR, Nasal     Status: None   Collection Time: 11/04/23  6:08 PM   Specimen: Nasal Mucosa; Nasal Swab  Result Value Ref Range Status   MRSA by PCR Next Gen NOT DETECTED NOT DETECTED Final    Comment: (NOTE) The GeneXpert MRSA Assay (FDA approved for NASAL specimens only), is one component of a comprehensive MRSA colonization  surveillance program. It is not intended to diagnose MRSA infection nor to guide or monitor treatment for MRSA infections. Test performance is not FDA approved in patients less than 67 years old. Performed at [facility], [address] [place], [place] [address]     Labs: Results for orders placed or performed during the hospital encounter of 10/31/23 (from the past 48 hours)  CBC     Status: Abnormal   Collection Time: 11/03/23  8:44 AM  Result Value Ref Range   WBC 16.5 (H) 4.0 - 10.5 K/uL   RBC 3.31 (L) 4.22 - 5.81 MIL/uL   Hemoglobin 10.4 (L) 13.0 - 17.0 g/dL   HCT 60.4 (L) 54.0 - 98.1 %   MCV 97.6 80.0 - 100.0 fL   MCH 31.4 26.0 - 34.0 pg   MCHC 32.2 30.0 - 36.0 g/dL   RDW 19.1 47.8 - 29.5 %   Platelets 224 150 - 400 K/uL   nRBC 0.0 0.0 - 0.2 %    Comment: Performed at [facility], [address], [place], [place] [address]  Basic metabolic panel     Status: Abnormal   Collection Time: 11/03/23  8:44 AM  Result Value Ref Range   Sodium 128 (L) 135 - 145 mmol/L   Potassium 4.7 3.5 - 5.1 mmol/L   Chloride 94 (L) 98 - 111 mmol/L   CO2 24 22 - 32 mmol/L   Glucose, Bld 151 (H) 70 - 99 mg/dL    Comment: Glucose reference range applies only to samples taken after fasting for at least 8 hours.   BUN 35 (H) 6 - 20 mg/dL   Creatinine, Ser 8.65 (H) 0.61 - 1.24 mg/dL   Calcium 8.5 (L) 8.9 - 10.3 mg/dL   GFR, Estimated 43 (L) >60 mL/min    Comment: (NOTE) Calculated using the CKD-EPI Creatinine Equation (2021)    Anion gap 10 5 - 15    Comment: Performed at [facility], [address], [place], [place] [address]  Troponin I (High Sensitivity)     Status: None   Collection Time: 11/03/23  8:44 AM  Result Value Ref Range   Troponin I (High Sensitivity) 8 <18 ng/L    Comment: (NOTE) Elevated high sensitivity troponin I (hsTnI) values and significant  changes across serial measurements may suggest ACS but many other   chronic and acute conditions are known to elevate hsTnI results.  Refer to the "Links" section for chest pain algorithms and additional  guidance. Performed at [facility], [address], [place], [place] [address]   Troponin I (High Sensitivity)  Status: None   Collection Time: 11/03/23 10:20 AM  Result Value Ref Range   Troponin I (High Sensitivity) 6 <18 ng/L    Comment: (NOTE) Elevated high sensitivity troponin I (hsTnI) values and significant  changes across serial measurements may suggest ACS but many other  chronic and acute conditions are known to elevate hsTnI results.  Refer to the "Links" section for chest pain algorithms and additional  guidance. Performed at [facility], [address], [place], [place] [address]   Glucose, capillary     Status: Abnormal   Collection Time: 11/03/23 11:53 AM  Result Value Ref Range   Glucose-Capillary 138 (H) 70 - 99 mg/dL    Comment: Glucose reference range applies only to samples taken after fasting for at least 8 hours.  Glucose, capillary     Status: Abnormal   Collection Time: 11/03/23  4:15 PM  Result Value Ref Range   Glucose-Capillary 303 (H) 70 - 99 mg/dL    Comment: Glucose reference range applies only to samples taken after fasting for at least 8 hours.  Glucose, capillary     Status: Abnormal   Collection Time: 11/03/23  8:12 PM  Result Value Ref Range   Glucose-Capillary 216 (H) 70 - 99 mg/dL    Comment: Glucose reference range applies only to samples taken after fasting for at least 8 hours.  Glucose, capillary     Status: Abnormal   Collection Time: 11/04/23 12:14 AM  Result Value Ref Range   Glucose-Capillary 138 (H) 70 - 99 mg/dL    Comment: Glucose reference range applies only to samples taken after fasting for at least 8 hours.  Basic metabolic panel with GFR     Status: Abnormal   Collection Time: 11/04/23  3:40 AM  Result Value Ref Range   Sodium 132 (L) 135 - 145  mmol/L   Potassium 5.2 (H) 3.5 - 5.1 mmol/L   Chloride 98 98 - 111 mmol/L   CO2 24 22 - 32 mmol/L   Glucose, Bld 176 (H) 70 - 99 mg/dL    Comment: Glucose reference range applies only to samples taken after fasting for at least 8 hours.   BUN 32 (H) 6 - 20 mg/dL   Creatinine, Ser 6.04 (H) 0.61 - 1.24 mg/dL   Calcium 9.0 8.9 - 54.0 mg/dL   GFR, Estimated >98 >11 mL/min    Comment: (NOTE) Calculated using the CKD-EPI Creatinine Equation (2021)    Anion gap 10 5 - 15    Comment: Performed at [facility], [address], [place], [place] [address]  CBC     Status: Abnormal   Collection Time: 11/04/23  3:40 AM  Result Value Ref Range   WBC 17.8 (H) 4.0 - 10.5 K/uL   RBC 3.13 (L) 4.22 - 5.81 MIL/uL   Hemoglobin 10.0 (L) 13.0 - 17.0 g/dL   HCT 29.5 (L) 62.1 - 30.8 %   MCV 101.9 (H) 80.0 - 100.0 fL   MCH 31.9 26.0 - 34.0 pg   MCHC 31.3 30.0 - 36.0 g/dL   RDW 65.7 84.6 - 96.2 %   Platelets 263 150 - 400 K/uL   nRBC 0.0 0.0 - 0.2 %    Comment: Performed at [facility], [address], [place], [place] [address]  Glucose, capillary     Status: Abnormal   Collection Time: 11/04/23  4:09 AM  Result Value Ref Range   Glucose-Capillary 184 (H) 70 - 99 mg/dL  Comment: Glucose reference range applies only to samples taken after fasting for at least 8 hours.  Glucose, capillary     Status: Abnormal   Collection Time: 11/04/23  7:27 AM  Result Value Ref Range   Glucose-Capillary 181 (H) 70 - 99 mg/dL    Comment: Glucose reference range applies only to samples taken after fasting for at least 8 hours.  Blood gas, arterial     Status: Abnormal   Collection Time: 11/04/23 11:30 AM  Result Value Ref Range   Delivery systems NASAL CANNULA    pH, Arterial 7.42 7.35 - 7.45   pCO2 arterial 44 32 - 48 mmHg   pO2, Arterial 62 (L) 83 - 108 mmHg   Bicarbonate 28.5 (H) 20.0 - 28.0 mmol/L   Acid-Base Excess 3.4 (H) 0.0 - 2.0 mmol/L   O2 Saturation 94.8 %    Patient temperature 37.0    Collection site RIGHT RADIAL    Drawn by [id number]    Allens test (pass/fail) PASS PASS    Comment: Performed at [facility], [address] [place], [place] [address]  Glucose, capillary     Status: Abnormal   Collection Time: 11/04/23 11:48 AM  Result Value Ref Range   Glucose-Capillary 159 (H) 70 - 99 mg/dL    Comment: Glucose reference range applies only to samples taken after fasting for at least 8 hours.  Basic metabolic panel     Status: Abnormal   Collection Time: 11/04/23 12:10 PM  Result Value Ref Range   Sodium 133 (L) 135 - 145 mmol/L   Potassium 4.8 3.5 - 5.1 mmol/L   Chloride 100 98 - 111 mmol/L   CO2 23 22 - 32 mmol/L   Glucose, Bld 157 (H) 70 - 99 mg/dL    Comment: Glucose reference range applies only to samples taken after fasting for at least 8 hours.   BUN 30 (H) 6 - 20 mg/dL   Creatinine, Ser 9.81 0.61 - 1.24 mg/dL   Calcium 9.1 8.9 - 19.1 mg/dL   GFR, Estimated >47 >82 mL/min    Comment: (NOTE) Calculated using the CKD-EPI Creatinine Equation (2021)    Anion gap 10 5 - 15    Comment: Performed at [facility], [address] [place], [place] [address]  D-dimer, quantitative     Status: Abnormal   Collection Time: 11/04/23 12:56 PM  Result Value Ref Range   D-Dimer, Quant 2.59 (H) 0.00 - 0.50 ug/mL-FEU    Comment: (NOTE) At the manufacturer cut-off value of 0.5 g/mL FEU, this assay has a negative predictive value of 95-100%.This assay is intended for use in conjunction with a clinical pretest probability (PTP) assessment model to exclude pulmonary embolism (PE) and deep venous thrombosis (DVT) in outpatients suspected of PE or DVT. Results should be correlated with clinical presentation. Performed at [facility], [address], [place], [place] [address]   Glucose, capillary     Status: Abnormal   Collection Time: 11/04/23  4:28 PM  Result Value Ref Range    Glucose-Capillary 186 (H) 70 - 99 mg/dL    Comment: Glucose reference range applies only to samples taken after fasting for at least 8 hours.  MRSA Next Gen by PCR, Nasal     Status: None   Collection Time: 11/04/23  6:08 PM   Specimen: Nasal Mucosa; Nasal Swab  Result Value Ref Range   MRSA by PCR Next Gen NOT DETECTED NOT DETECTED  Comment: (NOTE) The GeneXpert MRSA Assay (FDA approved for NASAL specimens only), is one component of a comprehensive MRSA colonization surveillance program. It is not intended to diagnose MRSA infection nor to guide or monitor treatment for MRSA infections. Test performance is not FDA approved in patients less than 65 years old. Performed at [facility], [address] [place], [place] [address]   Glucose, capillary     Status: Abnormal   Collection Time: 11/04/23  9:40 PM  Result Value Ref Range   Glucose-Capillary 148 (H) 70 - 99 mg/dL    Comment: Glucose reference range applies only to samples taken after fasting for at least 8 hours.  Glucose, capillary     Status: Abnormal   Collection Time: 11/04/23 11:56 PM  Result Value Ref Range   Glucose-Capillary 159 (H) 70 - 99 mg/dL    Comment: Glucose reference range applies only to samples taken after fasting for at least 8 hours.  Blood gas, arterial     Status: Abnormal   Collection Time: 11/05/23  2:05 AM  Result Value Ref Range   FIO2 100 %   Delivery systems VENTILATOR    Mode PRESSURE REGULATED VOLUME CONTROL    MECHVT 670 mL   RATE 18 resp/min   PEEP 8 cm H20   pH, Arterial 7.35 7.35 - 7.45   pCO2 arterial 56 (H) 32 - 48 mmHg   pO2, Arterial 87 83 - 108 mmHg   Bicarbonate 31.1 (H) 20.0 - 28.0 mmol/L   Acid-Base Excess 4.6 (H) 0.0 - 2.0 mmol/L   O2 Saturation 98.5 %   Patient temperature 37.5    Collection site LEFT BRACHIAL    Drawn by [id number]    Allens test (pass/fail) PASS PASS    Comment: Performed at [facility], [address],  [place], [place] [address]  Basic metabolic panel with GFR     Status: Abnormal   Collection Time: 11/05/23  2:56 AM  Result Value Ref Range   Sodium 140 135 - 145 mmol/L    Comment: DELTA CHECK NOTED   Potassium 4.8 3.5 - 5.1 mmol/L   Chloride 104 98 - 111 mmol/L   CO2 29 22 - 32 mmol/L   Glucose, Bld 171 (H) 70 - 99 mg/dL    Comment: Glucose reference range applies only to samples taken after fasting for at least 8 hours.   BUN 30 (H) 6 - 20 mg/dL   Creatinine, Ser 2.13 0.61 - 1.24 mg/dL   Calcium 9.3 8.9 - 08.6 mg/dL   GFR, Estimated >57 >84 mL/min    Comment: (NOTE) Calculated using the CKD-EPI Creatinine Equation (2021)    Anion gap 7 5 - 15    Comment: Performed at [facility], [address] [place], [place] [address]  CBC     Status: Abnormal   Collection Time: 11/05/23  2:56 AM  Result Value Ref Range   WBC 14.5 (H) 4.0 - 10.5 K/uL   RBC 3.26 (L) 4.22 - 5.81 MIL/uL   Hemoglobin 10.4 (L) 13.0 - 17.0 g/dL   HCT 52.8 (L) 41.3 - 24.4 %   MCV 102.1 (H) 80.0 - 100.0 fL   MCH 31.9 26.0 - 34.0 pg   MCHC 31.2 30.0 - 36.0 g/dL   RDW 01.0 27.2 - 53.6 %   Platelets 295 150 - 400 K/uL   nRBC 0.0 0.0 - 0.2 %    Comment: Performed at [facility], [address] [place], [place] [address]  Brain natriuretic peptide     Status: Abnormal   Collection Time: 11/05/23  2:56 AM  Result Value Ref Range   B Natriuretic Peptide 450.2 (H) 0.0 - 100.0 pg/mL    Comment: Performed at [facility], [address] [place], [place] [address]  Ammonia     Status: Abnormal   Collection Time: 11/05/23  2:56 AM  Result Value Ref Range   Ammonia 42 (H) 9 - 35 umol/L    Comment: Performed at [facility], [address], [place], [place] [address]  Glucose, capillary     Status: Abnormal   Collection Time: 11/05/23  4:27 AM  Result Value Ref Range   Glucose-Capillary 177 (H) 70 - 99 mg/dL    Comment: Glucose reference range  applies only to samples taken after fasting for at least 8 hours.  Glucose, capillary     Status: Abnormal   Collection Time: 11/05/23  7:58 AM  Result Value Ref Range   Glucose-Capillary 154 (H) 70 - 99 mg/dL    Comment: Glucose reference range applies only to samples taken after fasting for at least 8 hours.    Imaging / Studies: DG Abd 1 View Result Date: 11/05/2023 CLINICAL DATA:  Evaluate enteric tube placement. EXAM: ABDOMEN - 1 VIEW COMPARISON:  11/03/2023 FINDINGS: Interval placement of enteric tube. Tip and side port are in satisfactory position below the level of the GE junction. IMPRESSION: Satisfactory position of enteric tube. Electronically Signed   By: Signa Kell M.D.   On: 11/05/2023 06:26   DG CHEST PORT 1 VIEW Result Date: 11/05/2023 CLINICAL DATA:  Status post intubation. EXAM: PORTABLE CHEST 1 VIEW COMPARISON:  November 02, 2023 FINDINGS: Limited study secondary to patient rotation. An endotracheal tube is seen with its distal tip approximately 4.0 cm from the carina. The heart size and mediastinal contours are within normal limits. Moderate to marked severity diffuse bilateral infiltrates are noted. No pleural effusion or pneumothorax is identified. The visualized skeletal structures are unremarkable. IMPRESSION: 1. Endotracheal tube positioning, as described above. 2. Moderate to marked severity diffuse bilateral infiltrates. Electronically Signed   By: Aram Candela M.D.   On: 11/05/2023 02:27   NM Pulmonary Perf and Vent Result Date: 11/04/2023 CLINICAL DATA:  Evaluate for pulmonary embolism. History of COPD and smoker. Asthma. EXAM: NUCLEAR MEDICINE PERFUSION LUNG SCAN TECHNIQUE: Perfusion images were obtained in multiple projections after intravenous injection of radiopharmaceutical. Ventilation scans intentionally deferred if perfusion scan and chest x-ray adequate for interpretation during COVID 19 epidemic. RADIOPHARMACEUTICALS:  4.02 mCi Tc-46m MAA IV COMPARISON:  Chest  radiograph from 11/02/2023 FINDINGS: On the anterior and posterior projection images there is no peripheral segmental perfusion defects to indicate pulmonary embolism. The right posterior oblique and left anterior oblique images were obscured by motion artifact. Patient was unable to tolerate the lateral projection images. IMPRESSION: 1. No signs of acute pulmonary embolism within the limitations described above. Electronically Signed   By: Signa Kell M.D.   On: 11/04/2023 12:16   VAS [place] LOWER EXTREMITY VENOUS (DVT) Result Date: 11/04/2023  Lower Venous DVT Study Patient Name:  MIGUELANGEL KORN  Date of Exam:   11/03/2023 Medical Rec #: [id number]        Accession #:    [id number] Date of Birth: 1971-04-16        Patient Gender: M Patient Age:   53 years Exam Location:  [facility] Procedure:  VAS [place] LOWER EXTREMITY VENOUS (DVT) Referring Phys: RAMESH KC --------------------------------------------------------------------------------  Indications: SOB, and Edema.  Risk Factors: Surgery Hernia repair and partial right nephrectomy (10/31/23) Hx of left renal cell cancer, recent diagnosis of right renal cell cancer. Comparison Study: Previous exam on 03/13/2015 was negative for DVT Performing Technologist: Ernestene Mention RVT, RDMS  Examination Guidelines: A complete evaluation includes B-mode imaging, spectral Doppler, color Doppler, and power Doppler as needed of all accessible portions of each vessel. Bilateral testing is considered an integral part of a complete examination. Limited examinations for reoccurring indications may be performed as noted. The reflux portion of the exam is performed with the patient in reverse Trendelenburg.  +---------+---------------+---------+-----------+----------+--------------+ RIGHT    CompressibilityPhasicitySpontaneityPropertiesThrombus Aging +---------+---------------+---------+-----------+----------+--------------+ CFV      Full           Yes      Yes                                  +---------+---------------+---------+-----------+----------+--------------+ SFJ      Full                                                        +---------+---------------+---------+-----------+----------+--------------+ FV Prox  Full           Yes      Yes                                 +---------+---------------+---------+-----------+----------+--------------+ FV Mid   Full           Yes      Yes                                 +---------+---------------+---------+-----------+----------+--------------+ FV DistalFull           Yes      Yes                                 +---------+---------------+---------+-----------+----------+--------------+ PFV      Full                                                        +---------+---------------+---------+-----------+----------+--------------+ POP      Full           Yes      Yes                                 +---------+---------------+---------+-----------+----------+--------------+ PTV      Full                                                        +---------+---------------+---------+-----------+----------+--------------+ PERO     Full                                                        +---------+---------------+---------+-----------+----------+--------------+   +---------+---------------+---------+-----------+----------+--------------+  LEFT     CompressibilityPhasicitySpontaneityPropertiesThrombus Aging +---------+---------------+---------+-----------+----------+--------------+ CFV      Full           Yes      Yes                                 +---------+---------------+---------+-----------+----------+--------------+ SFJ      Full                                                        +---------+---------------+---------+-----------+----------+--------------+ FV Prox  Full           Yes      Yes                                  +---------+---------------+---------+-----------+----------+--------------+ FV Mid   Full           Yes      Yes                                 +---------+---------------+---------+-----------+----------+--------------+ FV DistalFull           Yes      Yes                                 +---------+---------------+---------+-----------+----------+--------------+ PFV      Full                                                        +---------+---------------+---------+-----------+----------+--------------+ POP      Full           Yes      Yes                                 +---------+---------------+---------+-----------+----------+--------------+ PTV      Full                                                        +---------+---------------+---------+-----------+----------+--------------+ PERO     Full                                                        +---------+---------------+---------+-----------+----------+--------------+     Summary: BILATERAL: - No evidence of deep vein thrombosis seen in the lower extremities, bilaterally. -No evidence of popliteal cyst, bilaterally.   *See table(s) above for measurements and observations. Electronically signed by Coral Else MD on 11/04/2023 at 8:59:01 AM.    Final    DG Abd Portable 1V Result Date: 11/03/2023 CLINICAL DATA: Constipation EXAM: PORTABLE ABDOMEN -  1 VIEW COMPARISON:  CT 08/06/2023 FINDINGS: Several mildly dilated small bowel loops in the mid abdomen, possibly in the area of ventral hernia seen on prior CT. Cannot exclude small bowel obstruction. Gas is seen within nondistended colon. No free air, organomegaly or suspicious calcification. IMPRESSION: Mildly prominent mid abdominal small bowel loops. Cannot exclude small bowel obstruction, particularly given the ventral hernia seen on prior CT. Electronically Signed   By: Charlett Nose M.D.   On: 11/03/2023 12:16    Medications / Allergies: per  chart  Antibiotics: Anti-infectives (From admission, onward)    Start     Dose/Rate Route Frequency Ordered Stop   11/05/23 0800  piperacillin-tazobactam (ZOSYN) IVPB 3.375 g        3.375 g 12.5 mL/hr over 240 Minutes Intravenous Every 8 hours 11/05/23 0747     10/31/23 2000  ceFAZolin (ANCEF) IVPB 1 g/50 mL premix        1 g 100 mL/hr over 30 Minutes Intravenous Every 8 hours 10/31/23 1606 11/01/23 0449   10/31/23 0600  ceFAZolin (ANCEF) IVPB 2g/100 mL premix  Status:  Discontinued        2 g 200 mL/hr over 30 Minutes Intravenous On call to O.R. 10/31/23 0536 10/31/23 0538   10/31/23 0536  ceFAZolin (ANCEF) IVPB 2g/100 mL premix        2 g 200 mL/hr over 30 Minutes Intravenous 30 min pre-op 10/31/23 0536 10/31/23 1127         Note: Portions of this report may have been transcribed using voice recognition software. Every effort was made to ensure accuracy; however, inadvertent computerized transcription errors may be present.   Any transcriptional errors that result from this process are unintentional.    Ardeth Sportsman, MD, FACS, MASCRS Esophageal, Gastrointestinal & Colorectal Surgery Robotic and Minimally Invasive Surgery  [facility] [address], [address] [place], [place] [address] [phone] Fax [phone] Main  CONTACT INFORMATION: Weekday (9AM-5PM): Call [facility] main office at [phone] Weeknight (5PM-9AM) or Weekend/Holiday: Check EPIC "Web Links" tab & use "AMION" (password " [email or web address]") for General Surgery [facility] coverage  Please, DO NOT use SecureChat  (it is not reliable communication to reach operating surgeons & will lead to a delay in care).   Epic staff messaging available for outptient concerns needing 1-2 business day response.      11/05/2023  8:24 AM

## 2023-11-05 NOTE — Progress Notes (Signed)
 OT Cancellation Note  Patient Details Name: Preston [name] MRN: [id number] DOB: 1970-08-23   Cancelled Treatment:    Reason Eval/Treat Not Completed: Medical issues which prohibited therapy Patient was emergently intubated this AM. OT to continue to follow and check back on 4/9 for medical appropriateness.   Rosalio Loud, MS Acute Rehabilitation Department Office# [phone]   11/05/2023, 7:09 AM

## 2023-11-05 NOTE — Progress Notes (Signed)
 Patient very agitated, AMS, increased WOB, very diaphoretic, therefore patient required emergent intubation.  Family on their way to hospital. Rozann Lesches, MD 11/05/2023 01:18

## 2023-11-05 NOTE — Progress Notes (Signed)
 CCM NP aware PT was transported to [facility] CT prior to ARDS vent protocol initiation.

## 2023-11-05 NOTE — Progress Notes (Signed)
 Notified Lab Victorino Dike) ABG being sent for analysis.

## 2023-11-05 NOTE — Procedures (Signed)
 Intubation Procedure Note  KASTIN CERDA  [id number]  1970-08-27  Date:11/05/23  Time:1:14 AM   Provider Performing:Cloey Sferrazza Sherryll Burger    Procedure: Intubation (31500)  Indication(s) Respiratory Failure  Consent Risks of the procedure as well as the alternatives and risks of each were explained to the patient and/or caregiver.  Consent for the procedure was obtained and is signed in the bedside chart   Anesthesia Etomidate, Versed, and Rocuronium   Time Out Verified patient identification, verified procedure, site/side was marked, verified correct patient position, special equipment/implants available, medications/allergies/relevant history reviewed, required imaging and test results available.   Sterile Technique Usual hand hygeine, masks, and gloves were used   Procedure Description Patient positioned in bed supine.  Sedation given as noted above.  Patient was intubated with endotracheal tube using Glidescope.  View was Grade 1 full glottis .  Number of attempts was 1.  Colorimetric CO2 detector was consistent with tracheal placement.   Complications/Tolerance None; patient tolerated the procedure well. Chest X-ray is ordered to verify placement.   EBL none   Specimen(s) None

## 2023-11-05 NOTE — Progress Notes (Signed)
 Patient ID: Preston [name], male   DOB: 12-25-1970, 53 y.o.   MRN: [id number]  5 Days Post-Op Subjective: Pt moved to the ICU last evening due to increasing agitation and nursing care requirements.  Required intubation early this morning.  Family aware.  Objective: Vital signs in last 24 hours: Temp:  [97.8 F (36.6 C)-100.5 F (38.1 C)] 100.5 F (38.1 C) (04/08 0300) Pulse Rate:  [72-103] 76 (04/08 0700) Resp:  [15-29] 21 (04/08 0700) BP: (131-200)/(75-132) 139/81 (04/08 0700) SpO2:  [87 %-100 %] 96 % (04/08 0700) FiO2 (%):  [70 %-100 %] 70 % (04/08 0555)  Intake/Output from previous day: 04/07 0701 - 04/08 0700 In: 1390.1 [P.O.:480; I.V.:910.1] Out: 2005 [Urine:2005] Intake/Output this shift: No intake/output data recorded.  Physical Exam:  General: Alert and oriented Abdomen: Soft, ND, ecchymosis over left side of the abdomen consistent with subcutaneous bleeding that is likely resolved and not unexpected after his abdominal procedure. Incisions: C/D/I GU: Catheter now back in place with clear urine.  Lab Results: Recent Labs    11/03/23 0844 11/04/23 0340 11/05/23 0256  HGB 10.4* 10.0* 10.4*  HCT 32.3* 31.9* 33.3*   BMET Recent Labs    11/04/23 1210 11/05/23 0256  NA 133* 140  K 4.8 4.8  CL 100 104  CO2 23 29  GLUCOSE 157* 171*  BUN 30* 30*  CREATININE 1.08 1.01  CALCIUM 9.1 9.3     Studies/Results: DG Abd 1 View Result Date: 11/05/2023 CLINICAL DATA:  Evaluate enteric tube placement. EXAM: ABDOMEN - 1 VIEW COMPARISON:  11/03/2023 FINDINGS: Interval placement of enteric tube. Tip and side port are in satisfactory position below the level of the GE junction. IMPRESSION: Satisfactory position of enteric tube. Electronically Signed   By: Signa Kell M.D.   On: 11/05/2023 06:26   DG CHEST PORT 1 VIEW Result Date: 11/05/2023 CLINICAL DATA:  Status post intubation. EXAM: PORTABLE CHEST 1 VIEW COMPARISON:  November 02, 2023 FINDINGS: Limited study secondary to  patient rotation. An endotracheal tube is seen with its distal tip approximately 4.0 cm from the carina. The heart size and mediastinal contours are within normal limits. Moderate to marked severity diffuse bilateral infiltrates are noted. No pleural effusion or pneumothorax is identified. The visualized skeletal structures are unremarkable. IMPRESSION: 1. Endotracheal tube positioning, as described above. 2. Moderate to marked severity diffuse bilateral infiltrates. Electronically Signed   By: Aram Candela M.D.   On: 11/05/2023 02:27   NM Pulmonary Perf and Vent Result Date: 11/04/2023 CLINICAL DATA:  Evaluate for pulmonary embolism. History of COPD and smoker. Asthma. EXAM: NUCLEAR MEDICINE PERFUSION LUNG SCAN TECHNIQUE: Perfusion images were obtained in multiple projections after intravenous injection of radiopharmaceutical. Ventilation scans intentionally deferred if perfusion scan and chest x-ray adequate for interpretation during COVID 19 epidemic. RADIOPHARMACEUTICALS:  4.02 mCi Tc-62m MAA IV COMPARISON:  Chest radiograph from 11/02/2023 FINDINGS: On the anterior and posterior projection images there is no peripheral segmental perfusion defects to indicate pulmonary embolism. The right posterior oblique and left anterior oblique images were obscured by motion artifact. Patient was unable to tolerate the lateral projection images. IMPRESSION: 1. No signs of acute pulmonary embolism within the limitations described above. Electronically Signed   By: Signa Kell M.D.   On: 11/04/2023 12:16   VAS [place] LOWER EXTREMITY VENOUS (DVT) Result Date: 11/04/2023  Lower Venous DVT Study Patient Name:  Preston [name]  Date of Exam:   11/03/2023 Medical Rec #: [id number]  Accession #:    [id number] Date of Birth: Dec 17, 1970        Patient Gender: M Patient Age:   75 years [facility]am [facility] Procedure:      VAS [place] LOWER EXTREMITY VENOUS (DVT) Referring Phys: RAMESH KC  --------------------------------------------------------------------------------  Indications: SOB, and Edema.  Risk Factors: Surgery Hernia repair and partial right nephrectomy (10/31/23) Hx of left renal cell cancer, recent diagnosis of right renal cell cancer. Comparison Study: Previous exam on 03/13/2015 was negative for DVT Performing Technologist: Ernestene Mention RVT, RDMS  Examination Guidelines: A complete evaluation includes B-mode imaging, spectral Doppler, color Doppler, and power Doppler as needed of all accessible portions of each vessel. Bilateral testing is considered an integral part of a complete examination. Limited examinations for reoccurring indications may be performed as noted. The reflux portion of the exam is performed with the patient in reverse Trendelenburg.  +---------+---------------+---------+-----------+----------+--------------+ RIGHT    CompressibilityPhasicitySpontaneityPropertiesThrombus Aging +---------+---------------+---------+-----------+----------+--------------+ CFV      Full           Yes      Yes                                 +---------+---------------+---------+-----------+----------+--------------+ SFJ      Full                                                        +---------+---------------+---------+-----------+----------+--------------+ FV Prox  Full           Yes      Yes                                 +---------+---------------+---------+-----------+----------+--------------+ FV Mid   Full           Yes      Yes                                 +---------+---------------+---------+-----------+----------+--------------+ FV DistalFull           Yes      Yes                                 +---------+---------------+---------+-----------+----------+--------------+ PFV      Full                                                        +---------+---------------+---------+-----------+----------+--------------+ POP      Full            Yes      Yes                                 +---------+---------------+---------+-----------+----------+--------------+ PTV      Full                                                        +---------+---------------+---------+-----------+----------+--------------+  PERO     Full                                                        +---------+---------------+---------+-----------+----------+--------------+   +---------+---------------+---------+-----------+----------+--------------+ LEFT     CompressibilityPhasicitySpontaneityPropertiesThrombus Aging +---------+---------------+---------+-----------+----------+--------------+ CFV      Full           Yes      Yes                                 +---------+---------------+---------+-----------+----------+--------------+ SFJ      Full                                                        +---------+---------------+---------+-----------+----------+--------------+ FV Prox  Full           Yes      Yes                                 +---------+---------------+---------+-----------+----------+--------------+ FV Mid   Full           Yes      Yes                                 +---------+---------------+---------+-----------+----------+--------------+ FV DistalFull           Yes      Yes                                 +---------+---------------+---------+-----------+----------+--------------+ PFV      Full                                                        +---------+---------------+---------+-----------+----------+--------------+ POP      Full           Yes      Yes                                 +---------+---------------+---------+-----------+----------+--------------+ PTV      Full                                                        +---------+---------------+---------+-----------+----------+--------------+ PERO     Full                                                         +---------+---------------+---------+-----------+----------+--------------+  Summary: BILATERAL: - No evidence of deep vein thrombosis seen in the lower extremities, bilaterally. -No evidence of popliteal cyst, bilaterally.   *See table(s) above for measurements and observations. Electronically signed by Coral Else MD on 11/04/2023 at 8:59:01 AM.    Final    DG Abd Portable 1V Result Date: 11/03/2023 CLINICAL DATA: Constipation EXAM: PORTABLE ABDOMEN - 1 VIEW COMPARISON:  CT 08/06/2023 FINDINGS: Several mildly dilated small bowel loops in the mid abdomen, possibly in the area of ventral hernia seen on prior CT. Cannot exclude small bowel obstruction. Gas is seen within nondistended colon. No free air, organomegaly or suspicious calcification. IMPRESSION: Mildly prominent mid abdominal small bowel loops. Cannot exclude small bowel obstruction, particularly given the ventral hernia seen on prior CT. Electronically Signed   By: Charlett Nose M.D.   On: 11/03/2023 12:16    Assessment/Plan: POD # 5 s/p right RAL partial nephrectomy and incisional hernia repair with mesh - Appreciate pulmonary/critical care management.  Now intubated.  Likely chronic plus acute etiology.  CT angiogram of chest is pending.  Unable to be performed yesterday due to severe agitation. - EtOH withdrawal prophylaxis, nicotine patch - Renal function now normalized - Path - renal papillary adenoma (benign).  Reviewed with family. - Disposition will be dependent on medical issues (he will not have assistance at home after Thursday based on conversation with Victorino Dike (primary contact) last evening.   LOS: 3 days   Crecencio Mc 11/05/2023, 7:45 AM

## 2023-11-05 NOTE — Progress Notes (Signed)
 Notified Lab Tinnie Gens) that ABG being sent for analysis.  Ordered Sputum has been collected and sent to Lab.  RN aware.

## 2023-11-05 NOTE — Progress Notes (Addendum)
       Overnight   NAME: VERNA HAMON MRN: [id number] DOB : May 12, 1971    Date of Service   11/05/2023   HPI/Events of Note    Notified by nursing staff due to severity of patient agitation. CCM ground team physician is in the room. E-Link physician is aware and in room contact.  Assisted with minor orders for onsite Physician at request in immediacy..  RNs reporting extreme agitation, increased work of breathing. E-Link Physician/ground Physician has made decision to intubate.    Interventions/ Plan   Chest x-ray-ordered at request of CCM physician. ABG 1 hour post-intubation ordered at request of CCM physician. Propofol ordered at request of CCM physician during time of intubation.      Chinita Greenland BSN MSNA MSN ACNPC-AG Acute Care Nurse Practitioner [facility]

## 2023-11-05 NOTE — Plan of Care (Signed)
  Problem: Clinical Measurements: Goal: Will remain free from infection Outcome: Progressing Goal: Cardiovascular complication will be avoided Outcome: Progressing   Problem: Coping: Goal: Level of anxiety will decrease Outcome: Progressing   Problem: Elimination: Goal: Will not experience complications related to bowel motility Outcome: Progressing Goal: Will not experience complications related to urinary retention Outcome: Progressing   Problem: Pain Managment: Goal: General experience of comfort will improve and/or be controlled Outcome: Progressing   Problem: Safety: Goal: Ability to remain free from injury will improve Outcome: Progressing   Problem: Skin Integrity: Goal: Risk for impaired skin integrity will decrease Outcome: Progressing   Problem: Fluid Volume: Goal: Ability to maintain a balanced intake and output will improve Outcome: Progressing   Problem: Metabolic: Goal: Ability to maintain appropriate glucose levels will improve Outcome: Progressing   Problem: Skin Integrity: Goal: Risk for impaired skin integrity will decrease Outcome: Progressing   Problem: Tissue Perfusion: Goal: Adequacy of tissue perfusion will improve Outcome: Progressing   Problem: Bowel/Gastric: Goal: Gastrointestinal status for postoperative course will improve Outcome: Progressing   Problem: Clinical Measurements: Goal: Postoperative complications will be avoided or minimized Outcome: Progressing   Problem: Skin Integrity: Goal: Demonstration of wound healing without infection will improve Outcome: Progressing   Problem: Education: Goal: Knowledge of General Education information will improve Description: Including pain rating scale, medication(s)/side effects and non-pharmacologic comfort measures Outcome: Not Progressing   Problem: Health Behavior/Discharge Planning: Goal: Ability to manage health-related needs will improve Outcome: Not Progressing   Problem:  Clinical Measurements: Goal: Ability to maintain clinical measurements within normal limits will improve Outcome: Not Progressing Goal: Respiratory complications will improve Outcome: Not Progressing   Problem: Activity: Goal: Risk for activity intolerance will decrease Outcome: Not Progressing   Problem: Nutrition: Goal: Adequate nutrition will be maintained Outcome: Not Progressing   Problem: Nutritional: Goal: Maintenance of adequate nutrition will improve Outcome: Not Progressing Goal: Progress toward achieving an optimal weight will improve Outcome: Not Progressing   Problem: Education: Goal: Knowledge of the prescribed therapeutic regimen will improve Outcome: Not Progressing   Problem: Respiratory: Goal: Ability to achieve and maintain a regular respiratory rate will improve Outcome: Not Progressing

## 2023-11-05 NOTE — Progress Notes (Signed)
 Initial Nutrition Assessment  DOCUMENTATION CODES:   Obesity unspecified  INTERVENTION:  - If patient to remain intubated, recommend initiating tube feeds once medically appropriate. Would recommend: Pivot 1.5 at 70 ml/h (1680 ml per day) *Recommend starting at 75mL/hr and advancing by 10mL Q8H Provides 2520 kcal, 157 gm protein, 1260 ml free water daily  - FWF per CCM/MD.    NUTRITION DIAGNOSIS:   Inadequate oral intake related to inability to eat as evidenced by NPO status.  GOAL:   Patient will meet greater than or equal to 90% of their needs  MONITOR:   Vent status, Labs, Weight trends, TF tolerance  REASON FOR ASSESSMENT:   Ventilator    ASSESSMENT:   53 y.o. male with PMH significant for left partial nephrectomy for renal cell carcinoma, abdominal incisional hernia, HLD, CHF, COPD, and GERD who presented for elective abdominal incision hernia repair with combination right robotic assisted laparoscopic partial nephrectomy.   4/3 admitted for elective hernia repair and partial right nephrectomy  4/4 Soft diet 4/7 HH diet; Signs of alcohol withdrawal and AMS  4/8 Intubated in the early AM for AMS, agitation, and increased work of breathing  Patient is currently intubated on ventilator support MV: 12.6 L/min Temp (24hrs), Avg:100.1 F (37.8 C), Min:98.6 F (37 C), Max:101.2 F (38.4 C)  No family at bedside this AM during visit. Per chart review, weight stable since January.  Discussed with RN. Patient remains on high dose propofol due to ongoing agitation with withdrawal. Adding fentanyl today to try and wean down propofol.   Per [facility], bowel rest today with OGT to LIS. If expecting prolonged intubation, can consider starting tube feeds tomorrow. OGT xray verified in the stomach. Discussed with CCM, can likely start tube feeds tomorrow. Will provide tube feed recs in event patient remains intubated.   Medications reviewed and include: 1mg  folic acid, MVI,  100mg  thiamine, Fibercon BID, Miralax Fentanyl Propofol @ 44.12mL/hr (provides 1177 kcals over 24 hours)  Labs reviewed:  HA1C 6.4 Blood Glucose 148-186 x24 hours   NUTRITION - FOCUSED PHYSICAL EXAM:  Flowsheet Row Most Recent Value  Orbital Region No depletion  Upper Arm Region No depletion  Thoracic and Lumbar Region No depletion  Buccal Region No depletion  Temple Region No depletion  Clavicle Bone Region No depletion  Clavicle and Acromion Bone Region No depletion  Scapular Bone Region Unable to assess  Dorsal Hand No depletion  Patellar Region No depletion  Anterior Thigh Region No depletion  Posterior Calf Region No depletion  Edema (RD Assessment) Mild  Hair Reviewed  Eyes Unable to assess  Mouth Unable to assess  Skin Reviewed  Nails Reviewed       Diet Order:   Diet Order             Diet NPO time specified Except for: Ice Chips, Sips with Meds  Diet effective now           Diet - low sodium heart healthy                   EDUCATION NEEDS:  Not appropriate for education at this time  Skin:  Skin Assessment: Reviewed RN Assessment  Last BM:  4/6  Height:  Ht Readings from Last 1 Encounters:  11/05/23 6\' 3"  (1.905 m)   Weight:  Wt Readings from Last 1 Encounters:  10/31/23 135 kg   Ideal Body Weight:  89.09 kg  BMI:  Body mass index is 37.2 kg/m.  Estimated Nutritional Needs:  Kcal:  2400-2650 kcals Protein:  150-180 grams Fluid:  >/= 2.4L    Shelle Iron RD, LDN Contact via Secure Chat.

## 2023-11-05 NOTE — Telephone Encounter (Signed)
 S/W DPR. Will keep her updated as I am updated

## 2023-11-05 NOTE — Progress Notes (Signed)
 NAME:  Preston [name], MRN:  [id number], DOB:  1970-08-13, LOS: 3 ADMISSION DATE:  10/31/2023, CONSULTATION DATE:  11/04/2023 REFERRING MD:  Dr. Lanae Boast - [facility], CHIEF COMPLAINT:  Hypoxia and concern for alcohol    History of Present Illness:  Preston [name] is a 53 y.o. with a past medical history significant for left partial nephrectomy for renal cell carcinoma, abdominal incisional hernia, TN, HLD, CHF, COPD, and GERD who presented for elective abdominal incision hernia repair with combination right robotic assisted laparoscopic partial nephrectomy.  Per chart review it appears patient tolerated procedures well.    Postop day 1 patient was seen with mild hypoxia with SpO2 84% on room air this was felt due to splinting secondary to pain and pulmonary hygiene was encouraged.  Hospitalist service was consulted 4/5 for persistent hypoxemia.   By 4/7 hypoxemia continued to persistent with AMS as well, concerning for ETOH withdrawal, PCCM consulted   Pertinent  Medical History  Left partial nephrectomy for renal cell carcinoma, abdominal incisional hernia, TN, HLD, CHF, COPD, and GERD  Significant Hospital Events: Including procedures, antibiotic start and stop dates in addition to other pertinent events   4/3 admitted for elective hernia repair and partial right nephrectomy  4/4 hypoxic on RA  4/7 Signs of alcohol withdrawal and AMS   Interim History / Subjective:  Intubated overnight for agitation and work of breathing.  Ongoing ecchymosis to bilateral lower quadrants and flanks.   Objective   Blood pressure 139/81, pulse 76, temperature (!) 100.5 F (38.1 C), temperature source Axillary, resp. rate (!) 21, height 6\' 3"  (1.905 m), weight 135 kg, SpO2 96%.    Vent Mode: PRVC FiO2 (%):  [70 %-100 %] 70 % Set Rate:  [18 bmp] 18 bmp Vt Set:  [670 mL] 670 mL PEEP:  [8 cmH20] 8 cmH20 Plateau Pressure:  [27 cmH20] 27 cmH20   Intake/Output Summary (Last 24 hours) at 11/05/2023 0721 Last data  filed at 11/05/2023 0602 Gross per 24 hour  Intake 1390.07 ml  Output 2005 ml  Net -614.93 ml   Filed Weights   10/31/23 0537  Weight: 135 kg    Examination:  General: Middle aged male in NAD on vent HEENT: [place]/AT, PERRL, no JVD Neuro: Sedated CV: RRR, no MRG PULM:  No wheeze, clear bilateral breath sounds GI: Soft, hypoactive. Ecchymosis in bilateral lower quadrants and both flanks. See images.  Extremities:warm, dry, no acute deformity. No edema.   Skin: Ecchymosis as above.      Labs/Imaging: Na 140, K 4.8, BUN 30, Cr 1.01, Ammonia 42, BNP 450 WBC 14.5, Hgb 10.4, Plt 295  CXR - diffuse bilateral infiltrates KUB - good placement of gastric tube  Resolved Hospital Problem list     Assessment & Plan:  Acute likely on chronic hypoxic respiratory failure Concern for underlying COPD exacerbation Diffuse infiltrates on CXR raising concern for aspiration vs edema vs acute lung injury.  Active tobacco use -Per [facility] evaluation patient was seen with expiratory wheezing consistent with acute COPD exacerbation P: Full vent support VAP bundle WUA/SBT when meets criteria.  CTA chest pending Check ABG now with diffuse infiltrates to assess P/F ratio Continue Brovana, Pulmicort, and Yupelri Solumedrol q 12 hours.  Empiric HCAP coverage start - MRSA swab negative. Start Zosyn May need to diurese.   Right renal mass -S/p partial nephrectomy surgical pathology with renal papillary at AKI -resolved P: Primary management per urology Monitor urine output Ensure adequate renal perfusion  Chronic  diastolic CHF  -ECHO 4/5 with EF 50-55% with mildly reduced RV function -Not markedly overloaded on exam.  P: Continuous telemetry  Continue statin Strict intake and output  Closely monitor renal function and electrolytes   Early mild alcohol withdrawal  -At baseline patient drinks 12 beers per day  P: Librium taper continue per tube Propofol for sedation/agitation management as  above Seizure precautions  Supplement thiamine, folate, and multivitamin   Hyponatremia - improved  P; Trend BMP  Type 2 diabetes  P: SSI  CBG goal 140-180 CBG checks  GERD  P: PPI    Best Practice (right click and "Reselect all SmartList Selections" daily)   Diet/type: NPO DVT prophylaxis LMWH Pressure ulcer(s): N/A GI prophylaxis: PPI Lines: N/A Foley:  Yes, and it is still needed Code Status:  full code Last date of multidisciplinary goals of care discussion: Pending    Critical care time: 49 miutes     Joneen Roach, AGACNP-BC [facility]  See Amion for personal pager PCCM on call pager [phone] until 7pm. Please call Elink 7p-7a. [phone]  11/05/2023 7:51 AM

## 2023-11-05 NOTE — Progress Notes (Signed)
ABG sent to lab, lab made aware. 

## 2023-11-05 NOTE — Progress Notes (Signed)
 PT Cancellation Note  Patient Details Name: Preston [name] MRN: [id number] DOB: Sep 17, 1970   Cancelled Treatment:    Reason Eval/Treat Not Completed: Medical issues which prohibited therapy  Patient has now been intubated. Will follow. Blanchard Kelch PT Acute Rehabilitation Services Office [phone] Weekend pager-[phone]  Rada Hay 11/05/2023, 7:14 AM

## 2023-11-05 NOTE — Progress Notes (Signed)
 PCCM INTERVAL PROGRESS NOTE  PM rounds  ARDS protocol ABG pending Not entirely synchronous but pressure and minute ventilation are OK so will hold course.  - ABG pending.   CTA PE study: no obvious PE. Respiratory motion artifact cannot rule out smaller peripheral PE.  - hold heparin and doppler legs to further rule out VTE.     Preston [name], AGACNP-BC [facility]  See Amion for personal pager PCCM on call pager [phone] until 7pm. Please call Elink 7p-7a. [phone]  11/05/2023 3:02 PM

## 2023-11-06 ENCOUNTER — Inpatient Hospital Stay (HOSPITAL_COMMUNITY)

## 2023-11-06 DIAGNOSIS — F10931 Alcohol use, unspecified with withdrawal delirium: Secondary | ICD-10-CM | POA: Diagnosis not present

## 2023-11-06 DIAGNOSIS — Z86711 Personal history of pulmonary embolism: Secondary | ICD-10-CM

## 2023-11-06 DIAGNOSIS — D62 Acute posthemorrhagic anemia: Secondary | ICD-10-CM | POA: Diagnosis not present

## 2023-11-06 DIAGNOSIS — J80 Acute respiratory distress syndrome: Secondary | ICD-10-CM | POA: Diagnosis not present

## 2023-11-06 DIAGNOSIS — Z720 Tobacco use: Secondary | ICD-10-CM | POA: Diagnosis not present

## 2023-11-06 LAB — GLUCOSE, CAPILLARY
Glucose-Capillary: 155 mg/dL — ABNORMAL HIGH (ref 70–99)
Glucose-Capillary: 158 mg/dL — ABNORMAL HIGH (ref 70–99)
Glucose-Capillary: 198 mg/dL — ABNORMAL HIGH (ref 70–99)
Glucose-Capillary: 135 mg/dL — ABNORMAL HIGH (ref 70–99)
Glucose-Capillary: 144 mg/dL — ABNORMAL HIGH (ref 70–99)
Glucose-Capillary: 167 mg/dL — ABNORMAL HIGH (ref 70–99)

## 2023-11-06 LAB — MAGNESIUM
Magnesium: 2.2 mg/dL (ref 1.7–2.4)
Magnesium: 2.3 mg/dL (ref 1.7–2.4)

## 2023-11-06 LAB — COMPREHENSIVE METABOLIC PANEL WITH GFR
ALT: 22 U/L (ref 0–44)
AST: 32 U/L (ref 15–41)
Albumin: 3.4 g/dL — ABNORMAL LOW (ref 3.5–5.0)
Alkaline Phosphatase: 55 U/L (ref 38–126)
Anion gap: 12 (ref 5–15)
BUN: 32 mg/dL — ABNORMAL HIGH (ref 6–20)
CO2: 26 mmol/L (ref 22–32)
Calcium: 8.8 mg/dL — ABNORMAL LOW (ref 8.9–10.3)
Chloride: 99 mmol/L (ref 98–111)
Creatinine, Ser: 1.25 mg/dL — ABNORMAL HIGH (ref 0.61–1.24)
GFR, Estimated: >60 mL/min (ref >60)
Glucose, Bld: 170 mg/dL — ABNORMAL HIGH (ref 70–99)
Potassium: 4.1 mmol/L (ref 3.5–5.1)
Sodium: 137 mmol/L (ref 135–145)
Total Bilirubin: 0.9 mg/dL (ref 0.0–1.2)
Total Protein: 7 g/dL (ref 6.5–8.1)

## 2023-11-06 LAB — CBC
HCT: 32.5 % — ABNORMAL LOW (ref 39.0–52.0)
Hemoglobin: 9.8 g/dL — ABNORMAL LOW (ref 13.0–17.0)
MCH: 31.1 pg (ref 26.0–34.0)
MCHC: 30.2 g/dL (ref 30.0–36.0)
MCV: 103.2 fL — ABNORMAL HIGH (ref 80.0–100.0)
Platelets: 332 10*3/uL (ref 150–400)
RBC: 3.15 MIL/uL — ABNORMAL LOW (ref 4.22–5.81)
RDW: 13.6 % (ref 11.5–15.5)
WBC: 14.3 10*3/uL — ABNORMAL HIGH (ref 4.0–10.5)
nRBC: 0 % (ref 0.0–0.2)

## 2023-11-06 LAB — TRIGLYCERIDES: Triglycerides: 439 mg/dL — ABNORMAL HIGH (ref <150)

## 2023-11-06 LAB — PHOSPHORUS
Phosphorus: 4.1 mg/dL (ref 2.5–4.6)
Phosphorus: 4.7 mg/dL — ABNORMAL HIGH (ref 2.5–4.6)

## 2023-11-06 MED ORDER — MIDAZOLAM HCL 2 MG/2ML IJ SOLN
INTRAMUSCULAR | Status: AC
  Administered 2023-11-06: 2 mg via INTRAVENOUS
  Filled 2023-11-06: qty 2

## 2023-11-06 MED ORDER — ORAL CARE MOUTH RINSE: 15.0000 mL | OROMUCOSAL | Status: DC | PRN

## 2023-11-06 MED ORDER — ENOXAPARIN SODIUM 40 MG/0.4ML IJ SOSY
40.0000 mg | PREFILLED_SYRINGE | INTRAMUSCULAR | Status: DC
  Administered 2023-11-06 – 2023-11-12 (×7): 40 mg via SUBCUTANEOUS
  Filled 2023-11-06 (×7): qty 0.4

## 2023-11-06 MED ORDER — MIDAZOLAM HCL 2 MG/2ML IJ SOLN: 2.0000 mg | Freq: Once | INTRAMUSCULAR | Status: AC

## 2023-11-06 MED ORDER — POTASSIUM CHLORIDE 20 MEQ PO PACK
40.0000 meq | PACK | Freq: Once | ORAL | Status: DC
  Filled 2023-11-06 (×2): qty 2

## 2023-11-06 MED ORDER — FUROSEMIDE 10 MG/ML IJ SOLN
40.0000 mg | Freq: Four times a day (QID) | INTRAMUSCULAR | Status: AC
  Administered 2023-11-06 (×2): 40 mg via INTRAVENOUS
  Filled 2023-11-06 (×2): qty 4

## 2023-11-06 MED ORDER — PHENOBARBITAL 32.4 MG PO TABS: 32.4000 mg | ORAL_TABLET | Freq: Three times a day (TID) | ORAL | Status: DC

## 2023-11-06 MED ORDER — PROSOURCE TF20 ENFIT COMPATIBL EN LIQD: 60.0000 mL | Freq: Every day | ENTERAL | Status: DC

## 2023-11-06 MED ORDER — PHENOBARBITAL 32.4 MG PO TABS: 64.8000 mg | ORAL_TABLET | Freq: Three times a day (TID) | ORAL | Status: DC

## 2023-11-06 MED ORDER — SODIUM CHLORIDE 0.9 % IV SOLN
260.0000 mg | Freq: Once | INTRAVENOUS | Status: AC
  Administered 2023-11-06: 260 mg via INTRAVENOUS
  Filled 2023-11-06: qty 2

## 2023-11-06 MED ORDER — VITAL HIGH PROTEIN PO LIQD: 1000.0000 mL | ORAL | Status: DC

## 2023-11-06 MED ORDER — PHENOBARBITAL 32.4 MG PO TABS
97.2000 mg | ORAL_TABLET | Freq: Three times a day (TID) | ORAL | Status: DC
  Administered 2023-11-06: 97.2 mg
  Filled 2023-11-06: qty 3

## 2023-11-06 MED ORDER — POLYETHYLENE GLYCOL 3350 17 G PO PACK
17.0000 g | PACK | Freq: Every day | ORAL | Status: DC
  Administered 2023-11-06: 17 g via ORAL
  Filled 2023-11-06: qty 1

## 2023-11-06 MED ORDER — PIVOT 1.5 CAL PO LIQD
1000.0000 mL | ORAL | Status: DC
  Filled 2023-11-06: qty 1000

## 2023-11-06 MED ORDER — ORAL CARE MOUTH RINSE
15.0000 mL | OROMUCOSAL | Status: DC
  Administered 2023-11-06 – 2023-11-15 (×103): 15 mL via OROMUCOSAL

## 2023-11-06 MED ORDER — PHENOBARBITAL SODIUM 130 MG/ML IJ SOLN
130.0000 mg | Freq: Three times a day (TID) | INTRAMUSCULAR | Status: DC
  Administered 2023-11-06 – 2023-11-09 (×9): 130 mg via INTRAVENOUS
  Filled 2023-11-06 (×9): qty 1

## 2023-11-06 NOTE — Plan of Care (Signed)
  Problem: Clinical Measurements: Goal: Diagnostic test results will improve Outcome: Progressing Goal: Respiratory complications will improve Outcome: Progressing Goal: Cardiovascular complication will be avoided Outcome: Progressing   Problem: Nutrition: Goal: Adequate nutrition will be maintained Outcome: Progressing   Problem: Coping: Goal: Level of anxiety will decrease Outcome: Progressing   Problem: Elimination: Goal: Will not experience complications related to urinary retention Outcome: Progressing   Problem: Safety: Goal: Ability to remain free from injury will improve Outcome: Progressing   Problem: Skin Integrity: Goal: Risk for impaired skin integrity will decrease Outcome: Progressing   Problem: Fluid Volume: Goal: Ability to maintain a balanced intake and output will improve Outcome: Progressing   Problem: Metabolic: Goal: Ability to maintain appropriate glucose levels will improve Outcome: Progressing   Problem: Skin Integrity: Goal: Demonstration of wound healing without infection will improve Outcome: Progressing   Problem: Urinary Elimination: Goal: Ability to achieve and maintain urine output will improve Outcome: Progressing   Problem: Safety: Goal: Non-violent Restraint(s) Outcome: Progressing   Problem: Education: Goal: Knowledge of General Education information will improve Description: Including pain rating scale, medication(s)/side effects and non-pharmacologic comfort measures Outcome: Not Progressing   Problem: Health Behavior/Discharge Planning: Goal: Ability to manage health-related needs will improve Outcome: Not Progressing   Problem: Activity: Goal: Risk for activity intolerance will decrease Outcome: Not Progressing   Problem: Education: Goal: Ability to describe self-care measures that may prevent or decrease complications (Diabetes Survival Skills Education) will improve Outcome: Not Progressing

## 2023-11-06 NOTE — Progress Notes (Signed)
 eLink Physician-Brief Progress Note Patient Name: Preston [name] DOB: [birth year] MRN: [id number]   Date of Service  11/06/2023  HPI/Events of Note  Patient is intubated and on the ventilator, needs soft wrist restraints order to prevent self-extubation. Also needs order to unclog OG tube.  eICU Interventions  Orders entered.        Kamirah Shugrue U Annakate Soulier 11/06/2023, 1:22 AM

## 2023-11-06 NOTE — Progress Notes (Signed)
 eLink Physician-Brief Progress Note Patient Name: Preston [name] DOB: [birth year] MRN: [id number]   Date of Service  11/06/2023  HPI/Events of Note  Patient is intubated and needs restraints order renewed to prevent self-extubation.  eICU Interventions  Restraints renewed.        Thomasene Lot Lea Baine 11/06/2023, 11:41 PM

## 2023-11-06 NOTE — Progress Notes (Signed)
 11/06/2023  Preston [name] [id number] [birth year]  CARE TEAM: PCP: Claiborne Rigg, NP  Outpatient Care Team: Patient Care Team: Claiborne Rigg, NP as PCP - General (Nurse Practitioner) Jake Bathe, MD as PCP - Cardiology (Cardiology) Willis Modena, MD as Consulting Physician (Gastroenterology) Karie Soda, MD as Consulting Physician (General Surgery) Heloise Purpura, MD as Consulting Physician (Urology)  Inpatient Treatment Team: Treatment Team:  Heloise Purpura, MD Karie Soda, MD Harrie Foreman, [place], MD Lanae Boast, MD Pccm, Md, MD Mellow, Balinda Quails, RN Carlynn Herald, RN Covil, Windy Fast, NT Sadler, Christie Beckers, NT Marshall, Asher Muir, RN [place], Meda Klinefelter, RN Wofford, Deirdre Evener, RPH Nathanial Rancher, RN   Problem List:   Principal Problem:   Neoplasm of right kidney Active Problems:   Tobacco abuse   HLD (hyperlipidemia)   Alcohol abuse   Chronic diastolic congestive heart failure (HCC)   Gastroesophageal reflux disease without esophagitis   Type 2 diabetes mellitus treated without insulin (HCC)   Essential hypertension   Acute kidney injury (HCC)   Obesity, Class II, BMI 35-39.9   Diastasis recti   Incarcerated incisional hernia s/p repair w mesh 10/31/2023   History of Left Renal cell cancer   Hyponatremia   Acute respiratory failure with hypoxia (HCC)   Alcohol withdrawal delirium (HCC)   ABLA (acute blood loss anemia)   Primary hypertension   10/31/2023    POST-OPERATIVE DIAGNOSIS:  VENTRAL INCISIONAL INCARCERATED HERNIA  Dimensions of hernia post-op:  15cm x 7cm   PROCEDURE:   ROBOTIC & LAPAROSCOPIC REPAIR OF ABDOMINAL HERNIA WITH MESH  (See OR Findings below) TAP BLOCK - BILATERAL   SURGEON:  Ardeth Sportsman, MD  OR FINDINGS: Large supraumbilical incisional hernia incarcerated with a foot of transverse colon and greater omentum.  Swiss cheese hernias going down infraumbilically connecting with his prior low midline incision as  well.  Umbilical hernia.   Type of ventral wall repair:  Laparoscopic underlay repair .with Primary repair of largest hernia Placement of mesh: Centrally intraperitoneal with edges tucked into RECTRORECTUS & preperitoneal space Name of mesh: Bard Ventralight dual sided (polypropylene / Seprafilm) Size of mesh: 33x27cm Orientation: Vertical Mesh overlap:  5-7cm  10/31/2023   Postoperative diagnosis: Right renal neoplasm   Procedure:   Right robotic-assisted laparoscopic partial nephrectomy Intraoperative renal ultrasonography   Surgeon: Moody Bruins. M.D.   Intraoperative findings:       1. Warm renal ischemia time: 22 minutes       2. Intraoperative renal ultrasound findings: There was a small cystic appearing mass that measured about 1 cm adjacent to a larger renal cyst.   Drains: # 15 Blake perinephric drain  Assessment [facility] Stay = 4 days) 6 Days Post-Op    Worsening delirium and confusion and hypoxia most likely due to alcohol withdrawal and fluid overload.  Slowly improving. Plan:  More aggressive alcohol withdrawal.  Continue protocol.  Using propofol or Precedex for now.  Defer to pulmonary critical care.  Ventilatory support.  Elevated BNP with question of infiltrates versus fluid raises concern for at least some respiratory failure with pulmonary edema and possible emphysema/COPD exacerbation.  Responsive to diuresis. -1.5 L in past 24 hours.  Weaning FiO2 from 70% to more normal range guardedly hopeful sign.  Hopefully will gradually improve   CT angiogram argues against any pulmonary embolism.   Concern to filtrates of possible pneumonia.  On piperacillin/tazobactam per CCM  Defer to extubation to pulmonary  critical care.  They are going to try weaning trial today.  If it is felt he will not be extubated today, reasonable to start tube feeds through his orogastric tube so he does not get malnourished.  I discussed with CCM NP and will defer to them  if they wish to proceed or not.    Hopefully can do some diuresis and keep on the dry side.  Abdomen with persistent moderate ecchymosis but seems to be stable to slightly resolving.  I can palpate a little mild bulging supraumbilically consistent with postoperative seroma/hematoma.  No evidence of cellulitis.  I would hold off on trying to open up his incision lest mesh be at risk for exposure.  No free air on upper windows argues against massive abdominal catastrophe.  Benign abdominal exam with mild orogastric return consistent with with no major ileus or obstruction or peritonitis  Pain control per critical care.  Challenging this obese male with chronic pain issues.  Multimodal home regimen Cymbalta, BuSpar, and gabapentin.  .  Foley catheter for now.  Hopefully can remove once extubated.    monitor electrolytes & replace as needed  Keep K>4, Mg>2, Phos>3  -VTE prophylaxis- SCDs.  Anticoagulation prophyllaxis SQ as appropriate.  Okay to fully anticoagulate if there are concerns  I updated the patient's status to the patient's nurse  Recommendations were made.  Questions were answered.  She expressed understanding & appreciation.  -Disposition: TBD    I reviewed nursing notes, last 24 h vitals and pain scores, last 48 h intake and output, last 24 h labs and trends, and last 24 h imaging results.  I have reviewed this patient's available data, including medical history, events of note, test results, etc as part of my evaluation.   A significant portion of that time was spent in counseling. Care during the described time interval was provided by me.  This care required moderate level of medical decision making.  11/06/2023    Subjective: (Chief complaint)  Adjustments in sedation with wrist restraints.  Diuresis done.  CT of chest argues against any obvious pulmonary Blossom but raise extended for infiltrates and possible pneumonia.  Started on  antibiotics.  Diuresis.  Objective:  Vital signs:  Vitals:   11/06/23 0600 11/06/23 0627 11/06/23 0635 11/06/23 0639  BP: (!) 153/91     Pulse: 82     Resp: 17     Temp:      TempSrc:      SpO2: 96% 96% 94% 94%  Weight:      Height:        Last BM Date : 11/03/23  Intake/Output   Yesterday:  04/08 0701 - 04/09 0700 In: 1847.9 [I.V.:1318.1; NG/GT:380; IV Piggyback:149.8] Out: 4125 [Urine:3525; Emesis/NG output:600] This shift:  No intake/output data recorded.  Bowel function:  Flatus:  Unknown  BM:  No  Drain: (No drain)   Physical Exam: Clinical ICU nurse and CCM NP in room  General: Pt resting intubated and sedated.  no acute distress Eyes: PERRL  Sclera clear.  No icterus Neuro: No focal sensory/motor deficits. Lymph: No head/neck/groin lymphadenopathy Psych: Sedated at this point.  No more agitation/delirium  HENT: Normocephalic, Mucus membranes moist.  No thrush.  Orogastric tube just placed. Neck: Supple, No tracheal deviation.  No obvious thyromegaly Chest: No pain to chest wall compression.  Good respiratory excursion.  No audible wheezing but using abdominal wall to breathe.  Minimal conversational dyspnea. CV:  Pulses intact.  Regular rhythm.  No  major extremity edema MS: Normal AROM mjr joints.  No obvious deformity  Abdomen: Obese Soft.  Mildy distended.  Nontender.  Moderate ecchymosis near incision sites, especially left side.  Stable to slightly resolving.  All dressings off.   No cellulitis or drainage. Mild fullness at supraumbilical midline incision at epicenter of his hernia repair consistent with postoperative hematoma/seroma.   Leave it alone for now.  No guarding or peritonitis.    Ext:   No deformity.  No mjr edema.  No cyanosis Skin: No petechiae / purpurea.  No major sores.  Warm and dry    Results:   Cultures: Recent Results (from the past 720 hours)  MRSA Next Gen by PCR, Nasal     Status: None   Collection Time: 11/04/23  6:08  PM   Specimen: Nasal Mucosa; Nasal Swab  Result Value Ref Range Status   MRSA by PCR Next Gen NOT DETECTED NOT DETECTED Final    Comment: (NOTE) The GeneXpert MRSA Assay (FDA approved for NASAL specimens only), is one component of a comprehensive MRSA colonization surveillance program. It is not intended to diagnose MRSA infection nor to guide or monitor treatment for MRSA infections. Test performance is not FDA approved in patients less than 58 years old. Performed at [facility], [address], [place], [place] [address]   Culture, Respiratory w Gram Stain     Status: None (Preliminary result)   Collection Time: 11/05/23  9:56 AM   Specimen: SPU; Respiratory  Result Value Ref Range Status   Specimen Description   Final    SPUTUM Performed at [facility], [address] [place], [place] [address]    Special Requests   Final    SPUTUM Performed at [facility], [address], [place], [place] [address]    Gram Stain   Final    NO WBC SEEN FEW GRAM POSITIVE COCCI IN PAIRS IN CHAINS Performed at [facility], [address], [place], [place] [address]    Culture PENDING  Incomplete   Report Status PENDING  Incomplete    Labs: Results for orders placed or performed during the hospital encounter of 10/31/23 (from the past 48 hours)  Blood gas, arterial     Status: Abnormal   Collection Time: 11/04/23 11:30 AM  Result Value Ref Range   Delivery systems NASAL CANNULA    pH, Arterial 7.42 7.35 - 7.45   pCO2 arterial 44 32 - 48 mmHg   pO2, Arterial 62 (L) 83 - 108 mmHg   Bicarbonate 28.5 (H) 20.0 - 28.0 mmol/L   Acid-Base Excess 3.4 (H) 0.0 - 2.0 mmol/L   O2 Saturation 94.8 %   Patient temperature 37.0    Collection site RIGHT RADIAL    Drawn by [id number]    Allens test (pass/fail) PASS PASS    Comment: Performed at [facility], [address] [place], [place] [address]  Glucose, capillary      Status: Abnormal   Collection Time: 11/04/23 11:48 AM  Result Value Ref Range   Glucose-Capillary 159 (H) 70 - 99 mg/dL    Comment: Glucose reference range applies only to samples taken after fasting for at least 8 hours.  Basic metabolic panel     Status: Abnormal   Collection Time: 11/04/23 12:10 PM  Result Value Ref Range   Sodium 133 (L) 135 - 145 mmol/L   Potassium 4.8 3.5 - 5.1 mmol/L   Chloride 100  98 - 111 mmol/L   CO2 23 22 - 32 mmol/L   Glucose, Bld 157 (H) 70 - 99 mg/dL    Comment: Glucose reference range applies only to samples taken after fasting for at least 8 hours.   BUN 30 (H) 6 - 20 mg/dL   Creatinine, Ser 1.61 0.61 - 1.24 mg/dL   Calcium 9.1 8.9 - 09.6 mg/dL   GFR, Estimated >04 >54 mL/min    Comment: (NOTE) Calculated using the CKD-EPI Creatinine Equation (2021)    Anion gap 10 5 - 15    Comment: Performed at [facility], [address] [place], [place] [address]  D-dimer, quantitative     Status: Abnormal   Collection Time: 11/04/23 12:56 PM  Result Value Ref Range   D-Dimer, Quant 2.59 (H) 0.00 - 0.50 ug/mL-FEU    Comment: (NOTE) At the manufacturer cut-off value of 0.5 g/mL FEU, this assay has a negative predictive value of 95-100%.This assay is intended for use in conjunction with a clinical pretest probability (PTP) assessment model to exclude pulmonary embolism (PE) and deep venous thrombosis (DVT) in outpatients suspected of PE or DVT. Results should be correlated with clinical presentation. Performed at [facility], [address], [place], [place] [address]   Glucose, capillary     Status: Abnormal   Collection Time: 11/04/23  4:28 PM  Result Value Ref Range   Glucose-Capillary 186 (H) 70 - 99 mg/dL    Comment: Glucose reference range applies only to samples taken after fasting for at least 8 hours.  MRSA Next Gen by PCR, Nasal     Status: None   Collection Time: 11/04/23  6:08 PM   Specimen: Nasal  Mucosa; Nasal Swab  Result Value Ref Range   MRSA by PCR Next Gen NOT DETECTED NOT DETECTED    Comment: (NOTE) The GeneXpert MRSA Assay (FDA approved for NASAL specimens only), is one component of a comprehensive MRSA colonization surveillance program. It is not intended to diagnose MRSA infection nor to guide or monitor treatment for MRSA infections. Test performance is not FDA approved in patients less than 57 years old. Performed at [facility], [address] [place], [place] [address]   Glucose, capillary     Status: Abnormal   Collection Time: 11/04/23  9:40 PM  Result Value Ref Range   Glucose-Capillary 148 (H) 70 - 99 mg/dL    Comment: Glucose reference range applies only to samples taken after fasting for at least 8 hours.  Glucose, capillary     Status: Abnormal   Collection Time: 11/04/23 11:56 PM  Result Value Ref Range   Glucose-Capillary 159 (H) 70 - 99 mg/dL    Comment: Glucose reference range applies only to samples taken after fasting for at least 8 hours.  Blood gas, arterial     Status: Abnormal   Collection Time: 11/05/23  2:05 AM  Result Value Ref Range   FIO2 100 %   Delivery systems VENTILATOR    Mode PRESSURE REGULATED VOLUME CONTROL    MECHVT 670 mL   RATE 18 resp/min   PEEP 8 cm H20   pH, Arterial 7.35 7.35 - 7.45   pCO2 arterial 56 (H) 32 - 48 mmHg   pO2, Arterial 87 83 - 108 mmHg   Bicarbonate 31.1 (H) 20.0 - 28.0 mmol/L   Acid-Base Excess 4.6 (H) 0.0 - 2.0 mmol/L   O2 Saturation 98.5 %   Patient temperature 37.5  Collection site LEFT BRACHIAL    Drawn by [id number]    Allens test (pass/fail) PASS PASS    Comment: Performed at [facility], [address], [place], [place] [address]  Basic metabolic panel with GFR     Status: Abnormal   Collection Time: 11/05/23  2:56 AM  Result Value Ref Range   Sodium 140 135 - 145 mmol/L    Comment: DELTA CHECK NOTED   Potassium 4.8 3.5 - 5.1 mmol/L   Chloride 104 98 -  111 mmol/L   CO2 29 22 - 32 mmol/L   Glucose, Bld 171 (H) 70 - 99 mg/dL    Comment: Glucose reference range applies only to samples taken after fasting for at least 8 hours.   BUN 30 (H) 6 - 20 mg/dL   Creatinine, Ser 8.29 0.61 - 1.24 mg/dL   Calcium 9.3 8.9 - 56.2 mg/dL   GFR, Estimated >13 >08 mL/min    Comment: (NOTE) Calculated using the CKD-EPI Creatinine Equation (2021)    Anion gap 7 5 - 15    Comment: Performed at [facility], [address], [place], [place] [address]  CBC     Status: Abnormal   Collection Time: 11/05/23  2:56 AM  Result Value Ref Range   WBC 14.5 (H) 4.0 - 10.5 K/uL   RBC 3.26 (L) 4.22 - 5.81 MIL/uL   Hemoglobin 10.4 (L) 13.0 - 17.0 g/dL   HCT 69.6 (L) 29.5 - 28.4 %   MCV 102.1 (H) 80.0 - 100.0 fL   MCH 31.9 26.0 - 34.0 pg   MCHC 31.2 30.0 - 36.0 g/dL   RDW 13.2 44.0 - 10.2 %   Platelets 295 150 - 400 K/uL   nRBC 0.0 0.0 - 0.2 %    Comment: Performed at [facility], [address], [place], [place] [address]  Brain natriuretic peptide     Status: Abnormal   Collection Time: 11/05/23  2:56 AM  Result Value Ref Range   B Natriuretic Peptide 450.2 (H) 0.0 - 100.0 pg/mL    Comment: Performed at [facility], [address], [place], [place] [address]  Ammonia     Status: Abnormal   Collection Time: 11/05/23  2:56 AM  Result Value Ref Range   Ammonia 42 (H) 9 - 35 umol/L    Comment: Performed at [facility], [address], [place], [place] [address]  Glucose, capillary     Status: Abnormal   Collection Time: 11/05/23  4:27 AM  Result Value Ref Range   Glucose-Capillary 177 (H) 70 - 99 mg/dL    Comment: Glucose reference range applies only to samples taken after fasting for at least 8 hours.  Glucose, capillary     Status: Abnormal   Collection Time: 11/05/23  7:58 AM  Result Value Ref Range   Glucose-Capillary 154 (H) 70 - 99 mg/dL    Comment: Glucose reference range  applies only to samples taken after fasting for at least 8 hours.  Blood gas, arterial     Status: Abnormal   Collection Time: 11/05/23  9:56 AM  Result Value Ref Range   FIO2 70 %   Delivery systems VENTILATOR    Mode PRESSURE REGULATED VOLUME CONTROL    MECHVT 670 mL   RATE 18 resp/min   PEEP 8 cm H20   pH, Arterial 7.43 7.35 - 7.45   pCO2 arterial 45 32 - 48 mmHg   pO2,  Arterial 84 83 - 108 mmHg   Bicarbonate 29.2 (H) 20.0 - 28.0 mmol/L   Acid-Base Excess 4.7 (H) 0.0 - 2.0 mmol/L   O2 Saturation 98.9 %   Patient temperature 38.4    Collection site LEFT BRACHIAL    Drawn by [id number]     Comment: Performed at [facility], [address] [place], [place] [address]  Culture, Respiratory w Gram Stain     Status: None (Preliminary result)   Collection Time: 11/05/23  9:56 AM   Specimen: SPU; Respiratory  Result Value Ref Range   Specimen Description      SPUTUM Performed at [facility], [address] [place], [place] [address]    Special Requests      SPUTUM Performed at [facility], [address] [place], [place] [address]    Gram Stain      NO WBC SEEN FEW GRAM POSITIVE COCCI IN PAIRS IN CHAINS Performed at [facility], [address] [place], [place] [address]    Culture PENDING    Report Status PENDING   Glucose, capillary     Status: Abnormal   Collection Time: 11/05/23 11:30 AM  Result Value Ref Range   Glucose-Capillary 178 (H) 70 - 99 mg/dL    Comment: Glucose reference range applies only to samples taken after fasting for at least 8 hours.  Blood gas, arterial     Status: Abnormal   Collection Time: 11/05/23  1:43 PM  Result Value Ref Range   FIO2 100 %   Delivery systems VENTILATOR    Mode PRESSURE REGULATED VOLUME CONTROL    MECHVT 500 mL   RATE 22 resp/min   PEEP 10 cm H20   pH, Arterial 7.42 7.35 - 7.45   pCO2 arterial 48 32 - 48 mmHg   pO2, Arterial 304 (H) 83 - 108 mmHg    Bicarbonate 30.6 (H) 20.0 - 28.0 mmol/L   Acid-Base Excess 5.6 (H) 0.0 - 2.0 mmol/L   O2 Saturation 100 %   Patient temperature 38.4    Collection site LEFT BRACHIAL    Drawn by [id number]     Comment: Performed at [facility], [address] [place], [place] [address]  Glucose, capillary     Status: Abnormal   Collection Time: 11/05/23  3:19 PM  Result Value Ref Range   Glucose-Capillary 142 (H) 70 - 99 mg/dL    Comment: Glucose reference range applies only to samples taken after fasting for at least 8 hours.  Glucose, capillary     Status: Abnormal   Collection Time: 11/05/23  7:41 PM  Result Value Ref Range   Glucose-Capillary 134 (H) 70 - 99 mg/dL    Comment: Glucose reference range applies only to samples taken after fasting for at least 8 hours.  Glucose, capillary     Status: Abnormal   Collection Time: 11/05/23 11:52 PM  Result Value Ref Range   Glucose-Capillary 178 (H) 70 - 99 mg/dL    Comment: Glucose reference range applies only to samples taken after fasting for at least 8 hours.  CBC     Status: Abnormal   Collection Time: 11/06/23  2:50 AM  Result Value Ref Range   WBC 14.3 (H) 4.0 - 10.5 K/uL   RBC 3.15 (L) 4.22 - 5.81 MIL/uL   Hemoglobin 9.8 (L) 13.0 - 17.0 g/dL   HCT 96.2 (L) 95.2 - 84.1 %   MCV 103.2 (H) 80.0 -  100.0 fL   MCH 31.1 26.0 - 34.0 pg   MCHC 30.2 30.0 - 36.0 g/dL   RDW 40.9 81.1 - 91.4 %   Platelets 332 150 - 400 K/uL   nRBC 0.0 0.0 - 0.2 %    Comment: Performed at [facility], [address], [place], [place] [address]  Triglycerides     Status: Abnormal   Collection Time: 11/06/23  2:50 AM  Result Value Ref Range   Triglycerides 439 (H) <150 mg/dL    Comment: Performed at [facility], [address] [place], [place] [address]  Comprehensive metabolic panel with GFR     Status: Abnormal   Collection Time: 11/06/23  2:50 AM  Result Value Ref Range   Sodium 137 135 - 145 mmol/L    Potassium 4.1 3.5 - 5.1 mmol/L   Chloride 99 98 - 111 mmol/L   CO2 26 22 - 32 mmol/L   Glucose, Bld 170 (H) 70 - 99 mg/dL    Comment: Glucose reference range applies only to samples taken after fasting for at least 8 hours.   BUN 32 (H) 6 - 20 mg/dL   Creatinine, Ser 8.65 (H) 0.61 - 1.24 mg/dL   Calcium 8.8 (L) 8.9 - 10.3 mg/dL   Total Protein 7.0 6.5 - 8.1 g/dL   Albumin 3.4 (L) 3.5 - 5.0 g/dL   AST 32 15 - 41 U/L   ALT 22 0 - 44 U/L   Alkaline Phosphatase 55 38 - 126 U/L   Total Bilirubin 0.9 0.0 - 1.2 mg/dL   GFR, Estimated >78 >46 mL/min    Comment: (NOTE) Calculated using the CKD-EPI Creatinine Equation (2021)    Anion gap 12 5 - 15    Comment: Performed at [facility], [address] [place], [place] [address]  Magnesium     Status: None   Collection Time: 11/06/23  2:50 AM  Result Value Ref Range   Magnesium 2.2 1.7 - 2.4 mg/dL    Comment: Performed at [facility], [address] [place], [place] [address]  Phosphorus     Status: None   Collection Time: 11/06/23  2:50 AM  Result Value Ref Range   Phosphorus 4.1 2.5 - 4.6 mg/dL    Comment: Performed at [facility], [address] [place], [place] [address]  Glucose, capillary     Status: Abnormal   Collection Time: 11/06/23  3:26 AM  Result Value Ref Range   Glucose-Capillary 155 (H) 70 - 99 mg/dL    Comment: Glucose reference range applies only to samples taken after fasting for at least 8 hours.    Imaging / Studies: CT Angio Chest Pulmonary Embolism (PE) W or WO Contrast Result Date: 11/05/2023 CLINICAL DATA:  Respiratory failure. EXAM: CT ANGIOGRAPHY CHEST WITH CONTRAST TECHNIQUE: Multidetector CT imaging of the chest was performed using the standard protocol during bolus administration of intravenous contrast. Multiplanar CT image reconstructions and MIPs were obtained to evaluate the vascular anatomy. RADIATION DOSE REDUCTION: This exam was performed  according to the departmental dose-optimization program which includes automated exposure control, adjustment of the mA and/or kV according to patient size and/or use of iterative reconstruction technique. CONTRAST:  75mL OMNIPAQUE IOHEXOL 350 MG/ML SOLN COMPARISON:  Dec 19, 2021. FINDINGS: Cardiovascular: Mild cardiomegaly is noted. No definite evidence of large central pulmonary embolus seen in the main pulmonary artery or proximal portions of the left and right pulmonary arteries. However, due  to persistent respiratory motion artifact, smaller and more peripheral pulmonary emboli cannot be excluded on the basis of this exam, particularly in the lower lobe branches. Mediastinum/Nodes: Endotracheal tube is seen in grossly good position. Nasogastric tube is seen passing through esophagus into stomach. No significant adenopathy is noted. Thyroid gland is unremarkable. Lungs/Pleura: No pneumothorax or pleural effusion is noted. Bilateral upper lobe opacities are noted concerning for pneumonia. Emphysematous disease is noted. Right lower lobe airspace opacity is noted concerning for pneumonia or atelectasis. Mild left posterior basilar subsegmental atelectasis is noted. Upper Abdomen: No acute abnormality. Musculoskeletal: No chest wall abnormality. No acute or significant osseous findings. Review of the MIP images confirms the above findings. IMPRESSION: No definite evidence of large central pulmonary embolus seen in the main pulmonary artery or proximal portions of the left and right pulmonary arteries. However, due to persistent respiratory motion artifact, smaller and more peripheral pulmonary emboli cannot be excluded on the basis of this exam. Bilateral upper lobe and right lower lobe pneumonia is noted. Aortic Atherosclerosis (ICD10-I70.0) and Emphysema (ICD10-J43.9). Electronically Signed   By: Lupita Raider M.D.   On: 11/05/2023 14:44   DG Abd 1 View Result Date: 11/05/2023 CLINICAL DATA:  Evaluate enteric  tube placement. EXAM: ABDOMEN - 1 VIEW COMPARISON:  11/03/2023 FINDINGS: Interval placement of enteric tube. Tip and side port are in satisfactory position below the level of the GE junction. IMPRESSION: Satisfactory position of enteric tube. Electronically Signed   By: Signa Kell M.D.   On: 11/05/2023 06:26   DG CHEST PORT 1 VIEW Result Date: 11/05/2023 CLINICAL DATA:  Status post intubation. EXAM: PORTABLE CHEST 1 VIEW COMPARISON:  November 02, 2023 FINDINGS: Limited study secondary to patient rotation. An endotracheal tube is seen with its distal tip approximately 4.0 cm from the carina. The heart size and mediastinal contours are within normal limits. Moderate to marked severity diffuse bilateral infiltrates are noted. No pleural effusion or pneumothorax is identified. The visualized skeletal structures are unremarkable. IMPRESSION: 1. Endotracheal tube positioning, as described above. 2. Moderate to marked severity diffuse bilateral infiltrates. Electronically Signed   By: Aram Candela M.D.   On: 11/05/2023 02:27   NM Pulmonary Perf and Vent Result Date: 11/04/2023 CLINICAL DATA:  Evaluate for pulmonary embolism. History of COPD and smoker. Asthma. EXAM: NUCLEAR MEDICINE PERFUSION LUNG SCAN TECHNIQUE: Perfusion images were obtained in multiple projections after intravenous injection of radiopharmaceutical. Ventilation scans intentionally deferred if perfusion scan and chest x-ray adequate for interpretation during COVID 19 epidemic. RADIOPHARMACEUTICALS:  4.02 mCi Tc-23m MAA IV COMPARISON:  Chest radiograph from 11/02/2023 FINDINGS: On the anterior and posterior projection images there is no peripheral segmental perfusion defects to indicate pulmonary embolism. The right posterior oblique and left anterior oblique images were obscured by motion artifact. Patient was unable to tolerate the lateral projection images. IMPRESSION: 1. No signs of acute pulmonary embolism within the limitations described  above. Electronically Signed   By: Signa Kell M.D.   On: 11/04/2023 12:16    Medications / Allergies: per chart  Antibiotics: Anti-infectives (From admission, onward)    Start     Dose/Rate Route Frequency Ordered Stop   11/05/23 0800  piperacillin-tazobactam (ZOSYN) IVPB 3.375 g        3.375 g 12.5 mL/hr over 240 Minutes Intravenous Every 8 hours 11/05/23 0747     10/31/23 2000  ceFAZolin (ANCEF) IVPB 1 g/50 mL premix        1 g 100 mL/hr over 30 Minutes  Intravenous Every 8 hours 10/31/23 1606 11/01/23 0449   10/31/23 0600  ceFAZolin (ANCEF) IVPB 2g/100 mL premix  Status:  Discontinued        2 g 200 mL/hr over 30 Minutes Intravenous On call to O.R. 10/31/23 0536 10/31/23 0538   10/31/23 0536  ceFAZolin (ANCEF) IVPB 2g/100 mL premix        2 g 200 mL/hr over 30 Minutes Intravenous 30 min pre-op 10/31/23 0536 10/31/23 1127         Note: Portions of this report may have been transcribed using voice recognition software. Every effort was made to ensure accuracy; however, inadvertent computerized transcription errors may be present.   Any transcriptional errors that result from this process are unintentional.    Ardeth Sportsman, MD, FACS, MASCRS Esophageal, Gastrointestinal & Colorectal Surgery Robotic and Minimally Invasive Surgery  [facility] [address], [address] [place], [place] [address] [phone] Fax [phone] Main  CONTACT INFORMATION: Weekday (9AM-5PM): Call [facility] main office at [phone] Weeknight (5PM-9AM) or Weekend/Holiday: Check EPIC "Web Links" tab & use "AMION" (password " [email or web address]") for General Surgery [facility] coverage  Please, DO NOT use SecureChat  (it is not reliable communication to reach operating surgeons & will lead to a delay in care).   Epic staff messaging available for outptient concerns needing 1-2 business day response.      11/06/2023  7:33 AM

## 2023-11-06 NOTE — Progress Notes (Signed)
 Patient ID: Preston [name], male   DOB: April 21, 1971, 53 y.o.   MRN: [id number]  6 Days Post-Op Subjective: He remains intubated and sedated.  Objective: Vital signs in last 24 hours: Temp:  [100.2 F (37.9 C)-101.2 F (38.4 C)] 100.2 F (37.9 C) (04/09 0400) Pulse Rate:  [73-94] 82 (04/09 0600) Resp:  [13-26] 17 (04/09 0600) BP: (139-159)/(71-98) 153/91 (04/09 0600) SpO2:  [92 %-100 %] 94 % (04/09 0639) FiO2 (%):  [40 %-100 %] 40 % (04/09 0627) Weight:  [124.4 kg] 124.4 kg (04/09 0400)  Intake/Output from previous day: 04/08 0701 - 04/09 0700 In: 1847.9 [I.V.:1318.1; NG/GT:380; IV Piggyback:149.8] Out: 3725 [Urine:3525; Emesis/NG output:200] Intake/Output this shift: Total I/O In: 850.3 [I.V.:580.9; NG/GT:200; IV Piggyback:69.4] Out: 2175 [Urine:1975; Emesis/NG output:200]  Physical Exam:  General: Alert and oriented Abd: Soft, ecchymosis stable GU: Urine clear  Lab Results: Recent Labs    11/04/23 0340 11/05/23 0256 11/06/23 0250  HGB 10.0* 10.4* 9.8*  HCT 31.9* 33.3* 32.5*   BMET Recent Labs    11/05/23 0256 11/06/23 0250  NA 140 137  K 4.8 4.1  CL 104 99  CO2 29 26  GLUCOSE 171* 170*  BUN 30* 32*  CREATININE 1.01 1.25*  CALCIUM 9.3 8.8*     Studies/Results: CT Angio Chest Pulmonary Embolism (PE) W or WO Contrast Result Date: 11/05/2023 CLINICAL DATA:  Respiratory failure. EXAM: CT ANGIOGRAPHY CHEST WITH CONTRAST TECHNIQUE: Multidetector CT imaging of the chest was performed using the standard protocol during bolus administration of intravenous contrast. Multiplanar CT image reconstructions and MIPs were obtained to evaluate the vascular anatomy. RADIATION DOSE REDUCTION: This exam was performed according to the departmental dose-optimization program which includes automated exposure control, adjustment of the mA and/or kV according to patient size and/or use of iterative reconstruction technique. CONTRAST:  75mL OMNIPAQUE IOHEXOL 350 MG/ML SOLN COMPARISON:   Dec 19, 2021. FINDINGS: Cardiovascular: Mild cardiomegaly is noted. No definite evidence of large central pulmonary embolus seen in the main pulmonary artery or proximal portions of the left and right pulmonary arteries. However, due to persistent respiratory motion artifact, smaller and more peripheral pulmonary emboli cannot be excluded on the basis of this exam, particularly in the lower lobe branches. Mediastinum/Nodes: Endotracheal tube is seen in grossly good position. Nasogastric tube is seen passing through esophagus into stomach. No significant adenopathy is noted. Thyroid gland is unremarkable. Lungs/Pleura: No pneumothorax or pleural effusion is noted. Bilateral upper lobe opacities are noted concerning for pneumonia. Emphysematous disease is noted. Right lower lobe airspace opacity is noted concerning for pneumonia or atelectasis. Mild left posterior basilar subsegmental atelectasis is noted. Upper Abdomen: No acute abnormality. Musculoskeletal: No chest wall abnormality. No acute or significant osseous findings. Review of the MIP images confirms the above findings. IMPRESSION: No definite evidence of large central pulmonary embolus seen in the main pulmonary artery or proximal portions of the left and right pulmonary arteries. However, due to persistent respiratory motion artifact, smaller and more peripheral pulmonary emboli cannot be excluded on the basis of this exam. Bilateral upper lobe and right lower lobe pneumonia is noted. Aortic Atherosclerosis (ICD10-I70.0) and Emphysema (ICD10-J43.9). Electronically Signed   By: Lupita Raider M.D.   On: 11/05/2023 14:44   DG Abd 1 View Result Date: 11/05/2023 CLINICAL DATA:  Evaluate enteric tube placement. EXAM: ABDOMEN - 1 VIEW COMPARISON:  11/03/2023 FINDINGS: Interval placement of enteric tube. Tip and side port are in satisfactory position below the level of the GE junction. IMPRESSION: Satisfactory position  of enteric tube. Electronically Signed    By: Signa Kell M.D.   On: 11/05/2023 06:26   DG CHEST PORT 1 VIEW Result Date: 11/05/2023 CLINICAL DATA:  Status post intubation. EXAM: PORTABLE CHEST 1 VIEW COMPARISON:  November 02, 2023 FINDINGS: Limited study secondary to patient rotation. An endotracheal tube is seen with its distal tip approximately 4.0 cm from the carina. The heart size and mediastinal contours are within normal limits. Moderate to marked severity diffuse bilateral infiltrates are noted. No pleural effusion or pneumothorax is identified. The visualized skeletal structures are unremarkable. IMPRESSION: 1. Endotracheal tube positioning, as described above. 2. Moderate to marked severity diffuse bilateral infiltrates. Electronically Signed   By: Aram Candela M.D.   On: 11/05/2023 02:27   NM Pulmonary Perf and Vent Result Date: 11/04/2023 CLINICAL DATA:  Evaluate for pulmonary embolism. History of COPD and smoker. Asthma. EXAM: NUCLEAR MEDICINE PERFUSION LUNG SCAN TECHNIQUE: Perfusion images were obtained in multiple projections after intravenous injection of radiopharmaceutical. Ventilation scans intentionally deferred if perfusion scan and chest x-ray adequate for interpretation during COVID 19 epidemic. RADIOPHARMACEUTICALS:  4.02 mCi Tc-34m MAA IV COMPARISON:  Chest radiograph from 11/02/2023 FINDINGS: On the anterior and posterior projection images there is no peripheral segmental perfusion defects to indicate pulmonary embolism. The right posterior oblique and left anterior oblique images were obscured by motion artifact. Patient was unable to tolerate the lateral projection images. IMPRESSION: 1. No signs of acute pulmonary embolism within the limitations described above. Electronically Signed   By: Signa Kell M.D.   On: 11/04/2023 12:16    Assessment/Plan: POD # 6 s/p right RAL partial nephrectomy and incisional hernia repair with mesh - Appreciate pulmonary/critical care management.  Now intubated.  Likely chronic plus  acute etiology.  CT angiogram of chest negative for PE.  Also now with negative LE doppler (4/6) and negative VQ scan (although poor overall quality). - EtOH withdrawal prophylaxis, nicotine patch - Renal function now normalized - Path - renal papillary adenoma (benign).  Reviewed with family. - Disposition will be dependent on medical issues (he will not have assistance at home after Thursday based on conversation with Victorino Dike (primary contact) last evening.   LOS: 4 days   Crecencio Mc 11/06/2023, 7:00 AM

## 2023-11-06 NOTE — Progress Notes (Signed)
 Lower extremity venous duplex completed. Please see CV Procedures for preliminary results.  Shona Simpson, RVT 11/06/23 9:14 AM

## 2023-11-06 NOTE — Progress Notes (Signed)
 Nutrition Note  RD consulted for initiation and management of tube feeding. TF protocol was started per CCM.  Will order tube feeding based on recommendations provided on 4/8.  Pivot 1.5 at 70 ml/h (1680 ml per day) via OGT *Recommend starting at 26mL/hr and advancing by 10mL Q8H Provides 2520 kcal, 157 gm protein, 1260 ml free water daily   - FWF per CCM/MD.   Patient is currently intubated on ventilator support MV: 11.5 L/min Temp (24hrs), Avg:100.7 F (38.2 C), Min:100.2 F (37.9 C), Max:101.3 F (38.5 C)  Propofol: 36.5 ml/hr -providing ~963 fat kcals  Labs and medications reviewed.   Will continue to monitor.  Tilda Franco, MS, RD, LDN Inpatient Clinical Dietitian Contact via Secure chat

## 2023-11-06 NOTE — Progress Notes (Signed)
 PT Cancellation Note  Patient Details Name: Preston [name] MRN: [id number] DOB: July 16, 1971   Cancelled Treatment:    Reason Eval/Treat Not Completed: Medical issues which prohibited therapy Remains on ventilator. Will follow.  Blanchard Kelch PT Acute Rehabilitation Services Office [phone]    Rada Hay 11/06/2023, 7:23 AM

## 2023-11-06 NOTE — Progress Notes (Signed)
 OT Cancellation Note  Patient Details Name: Preston [name] MRN: [id number] DOB: November 06, 1970   Cancelled Treatment:    Reason Eval/Treat Not Completed: Medical issues which prohibited therapy Patient remains intubated with noted agitation in chart. OT to continue to follow and check back on 4/10.  Rosalio Loud, MS Acute Rehabilitation Department Office# [phone]  11/06/2023, 7:26 AM

## 2023-11-06 NOTE — Progress Notes (Signed)
 NAME:  Preston [name], MRN:  [id number], DOB:  June 03, 1971, LOS: 4 ADMISSION DATE:  10/31/2023, CONSULTATION DATE:  11/04/2023 REFERRING MD:  Dr. Lanae Boast - [facility], CHIEF COMPLAINT:  Hypoxia and concern for alcohol    History of Present Illness:  Preston [name] is a 53 y.o. with a past medical history significant for left partial nephrectomy for renal cell carcinoma, abdominal incisional hernia, TN, HLD, CHF, COPD, and GERD who presented for elective abdominal incision hernia repair with combination right robotic assisted laparoscopic partial nephrectomy.  Per chart review it appears patient tolerated procedures well.    Postop day 1 patient was seen with mild hypoxia with SpO2 84% on room air this was felt due to splinting secondary to pain and pulmonary hygiene was encouraged.  Hospitalist service was consulted 4/5 for persistent hypoxemia.   By 4/7 hypoxemia continued to persistent with AMS as well, concerning for ETOH withdrawal, PCCM consulted   Pertinent  Medical History  Left partial nephrectomy for renal cell carcinoma, abdominal incisional hernia, TN, HLD, CHF, COPD, and GERD  Significant Hospital Events: Including procedures, antibiotic start and stop dates in addition to other pertinent events   4/3 admitted for elective hernia repair and partial right nephrectomy  4/4 hypoxic on RA  4/7 Signs of alcohol withdrawal and AMS, intubated 4/8 high Fio2/PEEP needs. PNA vs edema. Met ARDS criteria. Zosyn started and diuresed.   Interim History / Subjective:  Vent requirements way down 40% 8 PEEP Responded well to diuresis.   Objective   Blood pressure (!) 153/91, pulse 82, temperature 100.2 F (37.9 C), temperature source Axillary, resp. rate 17, height 6\' 3"  (1.905 m), weight 124.4 kg, SpO2 94%.    Vent Mode: PRVC FiO2 (%):  [40 %-100 %] 40 % Set Rate:  [18 bmp-22 bmp] 22 bmp Vt Set:  [500 mL-670 mL] 500 mL PEEP:  [8 cmH20-10 cmH20] 8 cmH20 Plateau Pressure:  [17 cmH20-24 cmH20]  21 cmH20   Intake/Output Summary (Last 24 hours) at 11/06/2023 0738 Last data filed at 11/06/2023 4098 Gross per 24 hour  Intake 1847.85 ml  Output 4125 ml  Net -2277.15 ml   Filed Weights   10/31/23 0537 11/06/23 0400  Weight: 135 kg 124.4 kg    Examination:  General: Middle aged male on the vent HEENT: [place]/AT, PERRL, no JVD Neuro: Sedated RASS - 2, easily agitated CV: RRR, no MRG PULM: Coarse bases GI: Surgical sites look good. Lower quadrant and flank bruising is improving.   Extremities: No acute deformity. No significant edema.  Skin: Ecchymosis as above.     Labs/Imaging: Na 137, K 4.1, BUN 32, Cr 1.25, Ammonia 42 WBC 14.3, Hgb 9.8, Plt 332 Triglycerides 439  CXR: persistent diffuse interstitial infiltrates.    Resolved Hospital Problem list     Assessment & Plan:  Acute likely on chronic hypoxic respiratory failure Concern for underlying COPD exacerbation Diffuse infiltrates on CXR raising concern for aspiration vs edema vs acute lung injury.  Active tobacco use -Per [facility] evaluation patient was seen with expiratory wheezing consistent with acute COPD exacerbation P: Full vent support VAP bundle WUA/SBT when meets criteria.  Will cautiously wean vent. Clinically looks better but imaging still looks bad.  FiO2 down to 40 and PEEP down to 5.  Continue Brovana, Pulmicort, and Yupelri. Solumedrol q 12 hours.  Zosyn day 2  Continue diuresis cautiously considering small bump in renal function.   Right renal mass -S/p partial nephrectomy surgical pathology  AKI -resolved  P: Primary management per urology, general surgery Monitor urine output  Chronic diastolic CHF  -ECHO 4/5 with EF 50-55% with mildly reduced RV function -Not markedly overloaded on exam.  P: Continuous telemetry  Continue statin Strict intake and output  Closely monitor renal function and electrolytes   Early mild alcohol withdrawal -At baseline patient drinks 12 beers per  day P: Librium taper continue per tube Propofol/Fentanyl for sedation/agitation management as above.  Scheduled clonazepam Seizure precautions Supplement thiamine, folate, and multivitamin  Hyponatremia - improved  P; Trend BMP  Type 2 diabetes  P: SSI  CBG goal 140-180 CBG checks  GERD  P: PPI    Best Practice (right click and "Reselect all SmartList Selections" daily)   Diet/type: NPO DVT prophylaxis LMWH Pressure ulcer(s): N/A GI prophylaxis: PPI Lines: N/A Foley:  Yes, and it is still needed Code Status:  full code Last date of multidisciplinary goals of care discussion: Pending    Critical care time: 40 minutes     Joneen Roach, AGACNP-BC [facility]  See Amion for personal pager PCCM on call pager [phone] until 7pm. Please call Elink 7p-7a. [phone]  11/06/2023 7:38 AM

## 2023-11-07 ENCOUNTER — Inpatient Hospital Stay (HOSPITAL_COMMUNITY)

## 2023-11-07 DIAGNOSIS — F10931 Alcohol use, unspecified with withdrawal delirium: Secondary | ICD-10-CM | POA: Diagnosis not present

## 2023-11-07 DIAGNOSIS — J9601 Acute respiratory failure with hypoxia: Secondary | ICD-10-CM | POA: Diagnosis not present

## 2023-11-07 DIAGNOSIS — D49511 Neoplasm of unspecified behavior of right kidney: Secondary | ICD-10-CM | POA: Diagnosis not present

## 2023-11-07 LAB — TRIGLYCERIDES: Triglycerides: 588 mg/dL — ABNORMAL HIGH (ref <150)

## 2023-11-07 LAB — BASIC METABOLIC PANEL WITH GFR
Anion gap: 10 (ref 5–15)
BUN: 42 mg/dL — ABNORMAL HIGH (ref 6–20)
CO2: 30 mmol/L (ref 22–32)
Calcium: 9.1 mg/dL (ref 8.9–10.3)
Chloride: 104 mmol/L (ref 98–111)
Creatinine, Ser: 1.3 mg/dL — ABNORMAL HIGH (ref 0.61–1.24)
GFR, Estimated: >60 mL/min (ref >60)
Glucose, Bld: 128 mg/dL — ABNORMAL HIGH (ref 70–99)
Potassium: 4 mmol/L (ref 3.5–5.1)
Sodium: 144 mmol/L (ref 135–145)

## 2023-11-07 LAB — CBC
HCT: 35.8 % — ABNORMAL LOW (ref 39.0–52.0)
Hemoglobin: 10.6 g/dL — ABNORMAL LOW (ref 13.0–17.0)
MCH: 31.5 pg (ref 26.0–34.0)
MCHC: 29.6 g/dL — ABNORMAL LOW (ref 30.0–36.0)
MCV: 106.5 fL — ABNORMAL HIGH (ref 80.0–100.0)
Platelets: 341 10*3/uL (ref 150–400)
RBC: 3.36 MIL/uL — ABNORMAL LOW (ref 4.22–5.81)
RDW: 13.5 % (ref 11.5–15.5)
WBC: 15.5 10*3/uL — ABNORMAL HIGH (ref 4.0–10.5)
nRBC: 0 % (ref 0.0–0.2)

## 2023-11-07 LAB — GLUCOSE, CAPILLARY
Glucose-Capillary: 138 mg/dL — ABNORMAL HIGH (ref 70–99)
Glucose-Capillary: 136 mg/dL — ABNORMAL HIGH (ref 70–99)
Glucose-Capillary: 260 mg/dL — ABNORMAL HIGH (ref 70–99)
Glucose-Capillary: 205 mg/dL — ABNORMAL HIGH (ref 70–99)
Glucose-Capillary: 121 mg/dL — ABNORMAL HIGH (ref 70–99)

## 2023-11-07 LAB — CULTURE, RESPIRATORY W GRAM STAIN
Culture: NORMAL
Gram Stain: NONE SEEN

## 2023-11-07 LAB — MAGNESIUM
Magnesium: 2.5 mg/dL — ABNORMAL HIGH (ref 1.7–2.4)
Magnesium: 2.1 mg/dL (ref 1.7–2.4)

## 2023-11-07 LAB — PHOSPHORUS
Phosphorus: 3.8 mg/dL (ref 2.5–4.6)
Phosphorus: 3.2 mg/dL (ref 2.5–4.6)

## 2023-11-07 MED ORDER — DEXMEDETOMIDINE HCL IN NACL 400 MCG/100ML IV SOLN
0.0000 ug/kg/h | INTRAVENOUS | Status: DC
  Administered 2023-11-07: 0.8 ug/kg/h via INTRAVENOUS
  Administered 2023-11-07: 1.1 ug/kg/h via INTRAVENOUS
  Administered 2023-11-07: 1 ug/kg/h via INTRAVENOUS
  Administered 2023-11-07: 0.4 ug/kg/h via INTRAVENOUS
  Administered 2023-11-08 (×2): 1.2 ug/kg/h via INTRAVENOUS
  Filled 2023-11-07 (×6): qty 100

## 2023-11-07 MED ORDER — METHYLPREDNISOLONE SODIUM SUCC 40 MG IJ SOLR
40.0000 mg | Freq: Every day | INTRAMUSCULAR | Status: DC
  Administered 2023-11-08: 40 mg via INTRAVENOUS
  Filled 2023-11-07: qty 1

## 2023-11-07 MED ORDER — PROSOURCE TF20 ENFIT COMPATIBL EN LIQD
60.0000 mL | Freq: Three times a day (TID) | ENTERAL | Status: DC
  Administered 2023-11-07 – 2023-11-08 (×2): 60 mL
  Filled 2023-11-07 (×2): qty 60

## 2023-11-07 MED ORDER — PIVOT 1.5 CAL PO LIQD
1000.0000 mL | ORAL | Status: DC
  Administered 2023-11-07: 1000 mL
  Filled 2023-11-07: qty 1000

## 2023-11-07 MED ORDER — BISACODYL 10 MG RE SUPP
10.0000 mg | Freq: Every day | RECTAL | Status: DC
  Administered 2023-11-07 – 2023-11-12 (×6): 10 mg via RECTAL
  Filled 2023-11-07 (×6): qty 1

## 2023-11-07 MED ORDER — PIVOT 1.5 CAL PO LIQD
40.0000 mL/h | ORAL | Status: AC
  Administered 2023-11-07: 20 mL/h
  Filled 2023-11-07: qty 1000

## 2023-11-07 MED ORDER — PIVOT 1.5 CAL PO LIQD: 1000.0000 mL | ORAL | Status: DC

## 2023-11-07 MED ORDER — PIVOT 1.5 CAL PO LIQD
1000.0000 mL | ORAL | Status: DC
  Filled 2023-11-07 (×2): qty 1000

## 2023-11-07 MED ORDER — FUROSEMIDE 10 MG/ML IJ SOLN
60.0000 mg | Freq: Four times a day (QID) | INTRAMUSCULAR | Status: AC
  Administered 2023-11-07 (×3): 60 mg via INTRAVENOUS
  Filled 2023-11-07 (×3): qty 6

## 2023-11-07 MED ORDER — MIDAZOLAM HCL 2 MG/2ML IJ SOLN
2.0000 mg | Freq: Once | INTRAMUSCULAR | Status: AC
  Administered 2023-11-07: 2 mg via INTRAVENOUS
  Filled 2023-11-07: qty 2

## 2023-11-07 MED ORDER — PIVOT 1.5 CAL PO LIQD
40.0000 mL/h | ORAL | Status: DC
  Administered 2023-11-07: 40 mL/h
  Filled 2023-11-07: qty 1000

## 2023-11-07 MED ORDER — POTASSIUM CHLORIDE 10 MEQ/100ML IV SOLN
10.0000 meq | INTRAVENOUS | Status: AC
  Administered 2023-11-07 (×4): 10 meq via INTRAVENOUS
  Filled 2023-11-07 (×4): qty 100

## 2023-11-07 MED ORDER — CLONAZEPAM 0.5 MG PO TBDP
2.0000 mg | ORAL_TABLET | Freq: Two times a day (BID) | ORAL | Status: DC
  Administered 2023-11-07 – 2023-11-08 (×3): 2 mg
  Filled 2023-11-07 (×3): qty 4

## 2023-11-07 NOTE — Progress Notes (Signed)
 Patient ID: Preston [name], male   DOB: 05/21/1971, 53 y.o.   MRN: [id number]  7 Days Post-Op Subjective: Pt remains intubated.  CT angiogram negative for PE.  Objective: Vital signs in last 24 hours: Temp:  [98.8 F (37.1 C)-101.6 F (38.7 C)] 100.3 F (37.9 C) (04/10 0400) Pulse Rate:  [68-94] 68 (04/10 0600) Resp:  [16-22] 22 (04/10 0600) BP: (128-171)/(78-100) 128/78 (04/10 0600) SpO2:  [89 %-99 %] 94 % (04/10 0600) FiO2 (%):  [40 %-50 %] 40 % (04/10 0506) Weight:  [124.2 kg] 124.2 kg (04/10 0443)  Intake/Output from previous day: 04/09 0701 - 04/10 0700 In: 1763.2 [I.V.:1276.4; NG/GT:235.3; IV Piggyback:251.4] Out: 2400 [Urine:2325; Emesis/NG output:75] Intake/Output this shift: No intake/output data recorded.  Physical Exam:  General: Alert and oriented Abd: Soft, ND, ecchymosis over the abdomen stable  Lab Results: Recent Labs    11/05/23 0256 11/06/23 0250 11/07/23 0557  HGB 10.4* 9.8* 10.6*  HCT 33.3* 32.5* 35.8*   BMET Recent Labs    11/06/23 0250 11/07/23 0557  NA 137 144  K 4.1 4.0  CL 99 104  CO2 26 30  GLUCOSE 170* 128*  BUN 32* 42*  CREATININE 1.25* 1.30*  CALCIUM 8.8* 9.1     Studies/Results:   Assessment/Plan: POD # 7 s/p right RAL partial nephrectomy and incisional hernia repair with mesh - Appreciate pulmonary/critical care management.  Remains intubated. Possible aspiration pneumonia. - EtOH withdrawal prophylaxis, nicotine patch - Renal function stable - Path - renal papillary adenoma (benign).  Reviewed with family. - Disposition will be dependent on medical issues.   LOS: 5 days   Crecencio Mc 11/07/2023, 7:20 AM

## 2023-11-07 NOTE — Progress Notes (Signed)
 eLink Physician-Brief Progress Note Patient Name: Preston [name] DOB: 30-May-1971 MRN: [id number]   Date of Service  11/07/2023  HPI/Events of Note  KUB without significant ileus.  eICU Interventions  Enteral feeding resumed.        [name] 11/07/2023, 1:55 AM

## 2023-11-07 NOTE — Progress Notes (Signed)
 Nutrition Follow-up  DOCUMENTATION CODES:   Obesity unspecified  INTERVENTION:  - Per [facility], Pivot 1.5 @ 35mL/hr.   - Provides 1440 kcals, 90g protein, and 773mL/day.   - Per discussion with CCM Dr Chestine Spore, can add ProSource TF20 to meet protein needs.  - Add ProSource TF20 60mL TID: in addition to TF will provide 150 grams protein/day.  - FWF per CCM/MD.   - If able to advance TF and meet kcal needs, would recommend below TF regimen: Pivot 1.5 at 70 ml/h (1680 ml per day) *Would recommend advancing by 10mL Q6H Provides 2520 kcal, 157 gm protein, 1260 ml free water daily   NUTRITION DIAGNOSIS:   Inadequate oral intake related to inability to eat as evidenced by NPO status. *ongoing  GOAL:   Patient will meet greater than or equal to 90% of their needs *progressing, on TF  MONITOR:   Vent status, Labs, Weight trends, TF tolerance  REASON FOR ASSESSMENT:   Consult Enteral/tube feeding initiation and management  ASSESSMENT:   53 y.o. male with PMH significant for left partial nephrectomy for renal cell carcinoma, abdominal incisional hernia, HLD, CHF, COPD, and GERD who presented for elective abdominal incision hernia repair with combination right robotic assisted laparoscopic partial nephrectomy.  4/3 admitted for elective hernia repair and partial right nephrectomy  4/4 Soft diet 4/7 HH diet; Signs of alcohol withdrawal and AMS  4/8 Intubated in the early AM for AMS, agitation, and increased work of breathing 4/9 Pivot scheduled to start in the afternoon but was held due to high NGT output 4/10 KUB obtained and showed no significant ileus, TF started @ 20 overnight; in AM CCS changed TF order goal to 82mL/hr  Patient is currently intubated on ventilator support MV: 10.5 L/min Temp (24hrs), Avg:100.3 F (37.9 C), Min:98.8 F (37.1 C), Max:101.6 F (38.7 C)  TF was never started yesterday but to high NGT output. KUB obtained overnight and showed no significant ileus  and only mild gaseous distension. TF subsequently started last night at 48mL/hr.   This AM, CCS adjusted TF order to goal of 43mL/hr. CCS now managing TF rate.  Per discussion with CCM Dr. Chestine Spore this AM, can add ProSource TF20 to ensure protein needs are met.    Patient's wife and daughter at bedside during visit. They report patient's UBW to be 297# and that he has had no changes in weight recently. Patient was eating very well PTA.   Admit weight: 297# Current weight: 273# I&O's: -2.1L  Medications reviewed and include: Dulcolax, Fibercon BID, 1mg  folic acid, 100mg  thiamine, MVI Precedex Fentanyl Propofol - weaned off this afternoon  Labs reviewed:  Creatinine 1.30 HA1C 6.4 Blood Glucose 136-198 x24 hours  Diet Order:   Diet Order             Diet NPO time specified  Diet effective now           Diet - low sodium heart healthy                   EDUCATION NEEDS:  Not appropriate for education at this time  Skin:  Skin Assessment: Reviewed RN Assessment  Last BM:  4/6  Height:  Ht Readings from Last 1 Encounters:  11/05/23 6\' 3"  (1.905 m)   Weight:  Wt Readings from Last 1 Encounters:  11/07/23 124.2 kg   Ideal Body Weight:  89.09 kg  BMI:  Body mass index is 34.22 kg/m.  Estimated Nutritional Needs:  Kcal:  2400-2650 kcals ([employer]: 2510 kcals) Protein:  150-180 grams Fluid:  >/= 2.4L    Shelle Iron RD, LDN Contact via Secure Chat.

## 2023-11-07 NOTE — Plan of Care (Signed)
  Problem: Clinical Measurements: Goal: Ability to maintain clinical measurements within normal limits will improve Outcome: Progressing Goal: Will remain free from infection Outcome: Progressing Goal: Diagnostic test results will improve Outcome: Progressing Goal: Respiratory complications will improve Outcome: Progressing Goal: Cardiovascular complication will be avoided Outcome: Progressing   Problem: Activity: Goal: Risk for activity intolerance will decrease Outcome: Progressing   Problem: Nutrition: Goal: Adequate nutrition will be maintained Outcome: Progressing   Problem: Coping: Goal: Level of anxiety will decrease Outcome: Progressing   Problem: Elimination: Goal: Will not experience complications related to bowel motility Outcome: Progressing Goal: Will not experience complications related to urinary retention Outcome: Progressing   Problem: Pain Managment: Goal: General experience of comfort will improve and/or be controlled Outcome: Progressing   Problem: Safety: Goal: Ability to remain free from injury will improve Outcome: Progressing   Problem: Skin Integrity: Goal: Risk for impaired skin integrity will decrease Outcome: Progressing   Problem: Fluid Volume: Goal: Ability to maintain a balanced intake and output will improve Outcome: Progressing   Problem: Metabolic: Goal: Ability to maintain appropriate glucose levels will improve Outcome: Progressing   Problem: Nutritional: Goal: Maintenance of adequate nutrition will improve Outcome: Progressing Goal: Progress toward achieving an optimal weight will improve Outcome: Progressing   Problem: Skin Integrity: Goal: Risk for impaired skin integrity will decrease Outcome: Progressing   Problem: Tissue Perfusion: Goal: Adequacy of tissue perfusion will improve Outcome: Progressing   Problem: Clinical Measurements: Goal: Postoperative complications will be avoided or minimized Outcome:  Progressing   Problem: Skin Integrity: Goal: Demonstration of wound healing without infection will improve Outcome: Progressing   Problem: Urinary Elimination: Goal: Ability to avoid or minimize complications of infection will improve Outcome: Progressing Goal: Ability to achieve and maintain urine output will improve Outcome: Progressing   Problem: Safety: Goal: Non-violent Restraint(s) Outcome: Progressing

## 2023-11-07 NOTE — Progress Notes (Signed)
 NAME:  Preston [name], MRN:  [id number], DOB:  10-21-1970, LOS: 5 ADMISSION DATE:  10/31/2023, CONSULTATION DATE:  11/04/2023 REFERRING MD:  Dr. Lanae Boast - [facility], CHIEF COMPLAINT:  Hypoxia and concern for alcohol    History of Present Illness:  Preston [name] is a 53 y.o. with a past medical history significant for left partial nephrectomy for renal cell carcinoma, abdominal incisional hernia, TN, HLD, CHF, COPD, and GERD who presented for elective abdominal incision hernia repair with combination right robotic assisted laparoscopic partial nephrectomy.  Per chart review it appears patient tolerated procedures well.    Postop day 1 patient was seen with mild hypoxia with SpO2 84% on room air this was felt due to splinting secondary to pain and pulmonary hygiene was encouraged.  Hospitalist service was consulted 4/5 for persistent hypoxemia.   By 4/7 hypoxemia continued to persistent with AMS as well, concerning for ETOH withdrawal, PCCM consulted   Pertinent  Medical History  Left partial nephrectomy for renal cell carcinoma, abdominal incisional hernia, TN, HLD, CHF, COPD, and GERD  Significant Hospital Events: Including procedures, antibiotic start and stop dates in addition to other pertinent events   4/3 admitted for elective hernia repair and partial right nephrectomy  4/4 hypoxic on RA  4/7 Signs of alcohol withdrawal and AMS, intubated 4/8 high Fio2/PEEP needs. PNA vs edema. Met ARDS criteria. Zosyn started and diuresed.  4/9: weaned to minimal support, became hypoxic and back to 50% and 10 PEEP  Interim History / Subjective:  KUB negative for signs of ileus overnight and TF were restarted.  40% and 8 PEEP this morning Overnight course uneventful.  negative x 24h despite 2.4L output.  Intermittent agitation issues.   Objective   Blood pressure 128/78, pulse 68, temperature 100.3 F (37.9 C), temperature source Axillary, resp. rate (!) 22, height 6\' 3"  (1.905 m), weight  124.2 kg, SpO2 94%.    Vent Mode: PRVC FiO2 (%):  [40 %-50 %] 40 % Set Rate:  [22 bmp] 22 bmp Vt Set:  [500 mL] 500 mL PEEP:  [5 cmH20-8 cmH20] 8 cmH20 Plateau Pressure:  [18 cmH20-28 cmH20] 21 cmH20   Intake/Output Summary (Last 24 hours) at 11/07/2023 0714 Last data filed at 11/07/2023 0600 Gross per 24 hour  Intake 1763.16 ml  Output 2400 ml  Net -636.84 ml   Filed Weights   10/31/23 0537 11/06/23 0400 11/07/23 0443  Weight: 135 kg 124.4 kg 124.2 kg    Examination:  General: Middle aged male on vent HEENT: [place]/AT, PERRL, no JVD Neuro: Sedated RASS -3 CV: RRR, no MRG PULM: synchronous with the vent. Coarse bases GI: Surgical sites without issue. Bruising continues to improve. Soft. Non-distended.   Extremities: No acute deformity or edema.  Skin: Bruising as above    Labs/Imaging: Na 144, K 4.0, BUN 42, Cr 1.3 WBC 15.5, Hgb 10.6, Plt 341 Triglycerides 588  KUB:  mild distension of transverse colon. No significant small bowel distension.    Resolved Hospital Problem list     Assessment & Plan:  Acute likely on chronic hypoxic respiratory failure Concern for underlying COPD exacerbation Diffuse infiltrates on CXR raising concern for aspiration vs edema vs acute lung injury.  Active tobacco use -Per [facility] evaluation patient was seen with expiratory wheezing consistent with acute COPD exacerbation P: Full vent support VAP bundle Down to 40% and 8 PEEP again this morning Will need to adjust his sedation drip today due to rising triglycerides.  Continue Rosalyn Gess,  Pulmicort, and Yupelri. Solumedrol q 12 hours wean to daily Zosyn day 3 Continue diuresis cautiously considering small bump in renal function.   Right renal mass -S/p partial nephrectomy surgical pathology  AKI -resolved P: Primary management per urology, general surgery Monitor urine output  Chronic diastolic CHF  -ECHO 4/5 with EF 50-55% with mildly reduced RV function -Not markedly overloaded on  exam.  P: Continuous telemetry  Continue statin Strict intake and output  Closely monitor renal function and electrolytes   Early mild alcohol withdrawal -At baseline patient drinks 12 beers per day P: Librium taper continue per tube Scheduled clonazepam increase to 2mg  BID Home duloxetine, gabapentin Phenobarb started 4/9. No taper in place will need to address as he improves.  Supplement thiamine, folate, and multivitamin  Hyponatremia - improved  P; Trend BMP  Type 2 diabetes  P: SSI  CBG goal 140-180 CBG checks  GERD  P: PPI    Best Practice (right click and "Reselect all SmartList Selections" daily)   Diet/type: NPO DVT prophylaxis LMWH Pressure ulcer(s): N/A GI prophylaxis: PPI Lines: N/A Foley:  Yes, and it is still needed Code Status:  full code Last date of multidisciplinary goals of care discussion: Pending    Critical care time: 38 minutes    Joneen Roach, AGACNP-BC [facility]  See Amion for personal pager PCCM on call pager [phone] until 7pm. Please call Elink 7p-7a. [phone]  11/07/2023 7:14 AM

## 2023-11-07 NOTE — Progress Notes (Signed)
 OT Cancellation Note  Patient Details Name: Preston [name] MRN: [id number] DOB: [birth year]   Cancelled Treatment:    Reason Eval/Treat Not Completed: Medical issues which prohibited therapy  Reuben Likes, OTR/L 11/07/2023, 2:30 PM

## 2023-11-07 NOTE — Progress Notes (Signed)
 11/07/2023  Preston [name] [id number] 1970-11-19  CARE TEAM: PCP: Claiborne Rigg, NP  Outpatient Care Team: Patient Care Team: Claiborne Rigg, NP as PCP - General (Nurse Practitioner) Jake Bathe, MD as PCP - Cardiology (Cardiology) Willis Modena, MD as Consulting Physician (Gastroenterology) Karie Soda, MD as Consulting Physician (General Surgery) Heloise Purpura, MD as Consulting Physician (Urology)  Inpatient Treatment Team: Treatment Team:  Heloise Purpura, MD Karie Soda, MD Harrie Foreman, [place], MD Lanae Boast, MD Pccm, Md, MD Mellow, Balinda Quails, RN Hortencia Pilar, RN Baird Lyons, RN Reuben Likes, OT Pollet, Meda Klinefelter, RN Cindi Carbon, [facility]   Problem List:   Principal Problem:   Neoplasm of right kidney Active Problems:   Tobacco abuse   HLD (hyperlipidemia)   Alcohol abuse   Chronic diastolic congestive heart failure (HCC)   Gastroesophageal reflux disease without esophagitis   Type 2 diabetes mellitus treated without insulin (HCC)   Essential hypertension   Acute kidney injury (HCC)   Obesity, Class II, BMI 35-39.9   Diastasis recti   Incarcerated incisional hernia s/p repair w mesh 10/31/2023   History of Left Renal cell cancer   Hyponatremia   Acute respiratory failure with hypoxia (HCC)   Alcohol withdrawal delirium (HCC)   ABLA (acute blood loss anemia)   Primary hypertension   10/31/2023    POST-OPERATIVE DIAGNOSIS:  VENTRAL INCISIONAL INCARCERATED HERNIA  Dimensions of hernia post-op:  15cm x 7cm   PROCEDURE:   ROBOTIC & LAPAROSCOPIC REPAIR OF ABDOMINAL HERNIA WITH MESH  (See OR Findings below) TAP BLOCK - BILATERAL   SURGEON:  Ardeth Sportsman, MD  OR FINDINGS: Large supraumbilical incisional hernia incarcerated with a foot of transverse colon and greater omentum.  Swiss cheese hernias going down infraumbilically connecting with his prior low midline incision as well.  Umbilical hernia.   Type of ventral wall  repair:  Laparoscopic underlay repair .with Primary repair of largest hernia Placement of mesh: Centrally intraperitoneal with edges tucked into RECTRORECTUS & preperitoneal space Name of mesh: Bard Ventralight dual sided (polypropylene / Seprafilm) Size of mesh: 33x27cm Orientation: Vertical Mesh overlap:  5-7cm  10/31/2023   Postoperative diagnosis: Right renal neoplasm   Procedure:   Right robotic-assisted laparoscopic partial nephrectomy Intraoperative renal ultrasonography   Surgeon: Moody Bruins. M.D.   Intraoperative findings:       1. Warm renal ischemia time: 22 minutes       2. Intraoperative renal ultrasound findings: There was a small cystic appearing mass that measured about 1 cm adjacent to a larger renal cyst.   Drains: # 15 Blake perinephric drain  Assessment [facility] Stay = 5 days) 7 Days Post-Op    Worsening delirium and confusion and hypoxia most likely due to alcohol withdrawal and fluid overload.  Slowly improving. Plan:  Patient seen with Dr. Laverle Patter.  We both agree patient seems much more relaxed now that his alcohol withdrawal was more aggressively addressed.  Hopefully he will continue to improve.  Alcohol withdrawal protocol.  Using propofol over Precedex for now.  Defer to pulmonary critical care.  Ventilatory support.  Elevated BNP with question of infiltrates versus fluid raises concern for at least some respiratory failure with pulmonary edema and possible emphysema/COPD exacerbation.  Responsive to diuresis. -1.5 L in past 24 hours.  Weaning FiO2 from 70%/PEEP 8 to more normal range 40%/8 guardedly hopeful sign.  Hopefully will gradually improve   CT angiogram argues against any  pulmonary embolism.   Concern to filtrates of possible pneumonia.  On piperacillin/tazobactam per CCM  Defer to extubation to pulmonary critical care.  They are going to try weaning trial again today.  Apparently tube feeds held because dilated bowel seen on  KUB and concern for ileus.  However no evidence of massive distention or high NG tube output.  Suspect this was an overread.  Apparently feeling better today.  I would do tube feeds to goal.  But the caudal suppositories and gentle fiber regimen  Agree with diuresis and keep on the dry side.  Abdomen benign with resolving ecchymosis.  No cellulitis or abscess.  No distention or peritonitis.  No high OG tube return argues against ileus  Pain control per critical care.  Challenging this obese male with chronic pain issues.  Multimodal home regimen Cymbalta, BuSpar, and gabapentin.  .  Foley catheter for now.  Hopefully can remove once extubated.    monitor electrolytes & replace as needed  Keep K>4, Mg>2, Phos>3  -VTE prophylaxis- SCDs.  Anticoagulation prophyllaxis SQ as appropriate.  Okay to fully anticoagulate if there are concerns  I updated the patient's status to the patient's nurse  Recommendations were made.  Questions were answered.  She expressed understanding & appreciation.  -Disposition: TBD    I reviewed nursing notes, last 24 h vitals and pain scores, last 48 h intake and output, last 24 h labs and trends, and last 24 h imaging results.  I have reviewed this patient's available data, including medical history, events of note, test results, etc as part of my evaluation.   A significant portion of that time was spent in counseling. Care during the described time interval was provided by me.  This care required moderate level of medical decision making.  11/07/2023    Subjective: (Chief complaint)  Resting comfortably.  Concern for ileus on x-ray so tube feeds held.  X-ray better so now restarted at low rate.  No high OG tube output.  No worsening distention.  No obvious bowel movements.  Vent remains on low rate for now.  PEEP of 8 presumably given his morbid obesity.  Minimal secretions aspirated.  Objective:  Vital signs:  Vitals:   11/07/23 0400 11/07/23  0443 11/07/23 0500 11/07/23 0600  BP: 135/80  129/78 128/78  Pulse: 68  70 68  Resp: 19  (!) 22 (!) 22  Temp: 100.3 F (37.9 C)     TempSrc: Axillary     SpO2: 97%  97% 94%  Weight:  124.2 kg    Height:        Last BM Date : 11/03/23  Intake/Output   Yesterday:  04/09 0701 - 04/10 0700 In: 1763.2 [I.V.:1276.4; NG/GT:235.3; IV Piggyback:251.4] Out: 2400 [Urine:2325; Emesis/NG output:75] This shift:  No intake/output data recorded.  Bowel function:  Flatus:  Unknown  BM:  No  Drain: (No drain)   Physical Exam: Clinical ICU nurse team & Dr Laverle Patter in room with me  General: Pt resting intubated and sedated.  no acute distress Eyes: PERRL  Sclera clear.  No icterus Neuro: No focal sensory/motor deficits. Lymph: No head/neck/groin lymphadenopathy Psych: Sedated at this point.  No more agitation/delirium  HENT: Normocephalic, Mucus membranes moist.  No thrush.  Orogastric tube just placed. Neck: Supple, No tracheal deviation.  No obvious thyromegaly Chest: No pain to chest wall compression.  Good respiratory excursion.  No audible wheezing but using abdominal wall to breathe.  Minimal conversational dyspnea. CV:  Pulses intact.  Regular rhythm.  No major extremity edema MS: Normal AROM mjr joints.  No obvious deformity  Abdomen: Obese Soft.  Nondistended.  Nontender.  Moderate ecchymosis near incision sites, especially left side.  Stable to slightly resolving.  All dressings off.   No cellulitis or drainage. Mild fullness at supraumbilical midline incision at epicenter of his hernia repair consistent with postoperative hematoma/seroma.   No guarding or peritonitis.    Ext:   No deformity.  No mjr edema.  No cyanosis Skin: No petechiae / purpurea.  No major sores.  Warm and dry    Results:   Cultures: Recent Results (from the past 720 hours)  MRSA Next Gen by PCR, Nasal     Status: None   Collection Time: 11/04/23  6:08 PM   Specimen: Nasal Mucosa; Nasal Swab  Result  Value Ref Range Status   MRSA by PCR Next Gen NOT DETECTED NOT DETECTED Final    Comment: (NOTE) The GeneXpert MRSA Assay (FDA approved for NASAL specimens only), is one component of a comprehensive MRSA colonization surveillance program. It is not intended to diagnose MRSA infection nor to guide or monitor treatment for MRSA infections. Test performance is not FDA approved in patients less than 27 years old. Performed at [facility], [address] [place], [place] [address]   Culture, blood (Routine X 2) w Reflex to ID Panel     Status: None (Preliminary result)   Collection Time: 11/05/23  8:22 AM   Specimen: BLOOD  Result Value Ref Range Status   Specimen Description   Final    BLOOD BLOOD RIGHT ARM AEROBIC BOTTLE ONLY ANAEROBIC BOTTLE ONLY Performed at [facility], [address], [place], [place] [address]    Special Requests   Final    BOTTLES DRAWN AEROBIC AND ANAEROBIC Blood Culture results may not be optimal due to an inadequate volume of blood received in culture bottles Performed at [facility], [address], [place], [place] [address]    Culture   Final    NO GROWTH 2 DAYS Performed at [facility], [address], [place], [place] [address]    Report Status PENDING  Incomplete  Culture, blood (Routine X 2) w Reflex to ID Panel     Status: None (Preliminary result)   Collection Time: 11/05/23  8:22 AM   Specimen: BLOOD  Result Value Ref Range Status   Specimen Description   Final    BLOOD BLOOD RIGHT HAND AEROBIC BOTTLE ONLY ANAEROBIC BOTTLE ONLY Performed at [facility], [address], [place], [place] [address]    Special Requests   Final    BOTTLES DRAWN AEROBIC AND ANAEROBIC Blood Culture adequate volume Performed at [facility], [address], [place], [place] [address]    Culture   Final    NO GROWTH 2 DAYS Performed at [facility],  [address] [place], [place] [address]    Report Status PENDING  Incomplete  Culture, Respiratory w Gram Stain     Status: None (Preliminary result)   Collection Time: 11/05/23  9:56 AM   Specimen: SPU; Respiratory  Result Value Ref Range Status   Specimen Description   Final    SPUTUM Performed at [facility], [address] [place], [place] [address]    Special Requests   Final    SPUTUM Performed at [facility], [address] [place], [place]  [address]    Gram Stain   Final    NO WBC SEEN FEW GRAM POSITIVE COCCI IN PAIRS IN CHAINS    Culture   Final    CULTURE REINCUBATED FOR BETTER GROWTH Performed at [facility], [address] [place], [place] [address]    Report Status PENDING  Incomplete    Labs: Results for orders placed or performed during the hospital encounter of 10/31/23 (from the past 48 hours)  Glucose, capillary     Status: Abnormal   Collection Time: 11/05/23  7:58 AM  Result Value Ref Range   Glucose-Capillary 154 (H) 70 - 99 mg/dL    Comment: Glucose reference range applies only to samples taken after fasting for at least 8 hours.  Culture, blood (Routine X 2) w Reflex to ID Panel     Status: None (Preliminary result)   Collection Time: 11/05/23  8:22 AM   Specimen: BLOOD  Result Value Ref Range   Specimen Description      BLOOD BLOOD RIGHT ARM AEROBIC BOTTLE ONLY ANAEROBIC BOTTLE ONLY Performed at [facility], [address] [place], [place] [address]    Special Requests      BOTTLES DRAWN AEROBIC AND ANAEROBIC Blood Culture results may not be optimal due to an inadequate volume of blood received in culture bottles Performed at [facility], [address] [place], [place] [address]    Culture      NO GROWTH 2 DAYS Performed at [facility], [address] [place], [place] [address]    Report Status PENDING   Culture, blood (Routine X 2) w Reflex to  ID Panel     Status: None (Preliminary result)   Collection Time: 11/05/23  8:22 AM   Specimen: BLOOD  Result Value Ref Range   Specimen Description      BLOOD BLOOD RIGHT HAND AEROBIC BOTTLE ONLY ANAEROBIC BOTTLE ONLY Performed at [facility], [address] [place], [place] [address]    Special Requests      BOTTLES DRAWN AEROBIC AND ANAEROBIC Blood Culture adequate volume Performed at [facility], [address], [place], [place] [address]    Culture      NO GROWTH 2 DAYS Performed at [facility], [address], [place], [place] [address]    Report Status PENDING   Blood gas, arterial     Status: Abnormal   Collection Time: 11/05/23  9:56 AM  Result Value Ref Range   FIO2 70 %   Delivery systems VENTILATOR    Mode PRESSURE REGULATED VOLUME CONTROL    MECHVT 670 mL   RATE 18 resp/min   PEEP 8 cm H20   pH, Arterial 7.43 7.35 - 7.45   pCO2 arterial 45 32 - 48 mmHg   pO2, Arterial 84 83 - 108 mmHg   Bicarbonate 29.2 (H) 20.0 - 28.0 mmol/L   Acid-Base Excess 4.7 (H) 0.0 - 2.0 mmol/L   O2 Saturation 98.9 %   Patient temperature 38.4    Collection site LEFT BRACHIAL    Drawn by [id number]     Comment: Performed at [facility], [address], [place], [place] [address]  Culture, Respiratory w Gram Stain     Status: None (Preliminary result)   Collection Time: 11/05/23  9:56 AM   Specimen: SPU; Respiratory  Result Value Ref Range   Specimen Description      SPUTUM Performed at  [facility], [address], [place], [place] [address]    Special Requests      SPUTUM Performed at [facility], [address] [place], [place] [address]    Gram Stain      NO WBC SEEN FEW GRAM POSITIVE COCCI IN PAIRS IN CHAINS    Culture      CULTURE REINCUBATED FOR BETTER GROWTH Performed at [facility], [address] [place], [place] [address]    Report Status PENDING    Glucose, capillary     Status: Abnormal   Collection Time: 11/05/23 11:30 AM  Result Value Ref Range   Glucose-Capillary 178 (H) 70 - 99 mg/dL    Comment: Glucose reference range applies only to samples taken after fasting for at least 8 hours.  Blood gas, arterial     Status: Abnormal   Collection Time: 11/05/23  1:43 PM  Result Value Ref Range   FIO2 100 %   Delivery systems VENTILATOR    Mode PRESSURE REGULATED VOLUME CONTROL    MECHVT 500 mL   RATE 22 resp/min   PEEP 10 cm H20   pH, Arterial 7.42 7.35 - 7.45   pCO2 arterial 48 32 - 48 mmHg   pO2, Arterial 304 (H) 83 - 108 mmHg   Bicarbonate 30.6 (H) 20.0 - 28.0 mmol/L   Acid-Base Excess 5.6 (H) 0.0 - 2.0 mmol/L   O2 Saturation 100 %   Patient temperature 38.4    Collection site LEFT BRACHIAL    Drawn by [id number]     Comment: Performed at [facility], [address], [place], [place] [address]  Glucose, capillary     Status: Abnormal   Collection Time: 11/05/23  3:19 PM  Result Value Ref Range   Glucose-Capillary 142 (H) 70 - 99 mg/dL    Comment: Glucose reference range applies only to samples taken after fasting for at least 8 hours.  Glucose, capillary     Status: Abnormal   Collection Time: 11/05/23  7:41 PM  Result Value Ref Range   Glucose-Capillary 134 (H) 70 - 99 mg/dL    Comment: Glucose reference range applies only to samples taken after fasting for at least 8 hours.  Glucose, capillary     Status: Abnormal   Collection Time: 11/05/23 11:52 PM  Result Value Ref Range   Glucose-Capillary 178 (H) 70 - 99 mg/dL    Comment: Glucose reference range applies only to samples taken after fasting for at least 8 hours.  CBC     Status: Abnormal   Collection Time: 11/06/23  2:50 AM  Result Value Ref Range   WBC 14.3 (H) 4.0 - 10.5 K/uL   RBC 3.15 (L) 4.22 - 5.81 MIL/uL   Hemoglobin 9.8 (L) 13.0 - 17.0 g/dL   HCT 69.6 (L) 29.5 - 28.4 %   MCV 103.2 (H) 80.0 - 100.0 fL   MCH 31.1 26.0 - 34.0 pg   MCHC  30.2 30.0 - 36.0 g/dL   RDW 13.2 44.0 - 10.2 %   Platelets 332 150 - 400 K/uL   nRBC 0.0 0.0 - 0.2 %    Comment: Performed at [facility], [address] [place], [place] [address]  Triglycerides     Status: Abnormal   Collection Time: 11/06/23  2:50 AM  Result Value Ref Range   Triglycerides 439 (H) <150 mg/dL    Comment: Performed at [facility], [address] [place], [place] [address]  Comprehensive metabolic panel with GFR     Status: Abnormal   Collection Time: 11/06/23  2:50 AM  Result Value Ref Range   Sodium 137 135 - 145 mmol/L   Potassium 4.1 3.5 - 5.1 mmol/L   Chloride 99 98 - 111 mmol/L   CO2 26 22 - 32 mmol/L   Glucose, Bld 170 (H) 70 - 99 mg/dL    Comment: Glucose reference range applies only to samples taken after fasting for at least 8 hours.   BUN 32 (H) 6 - 20 mg/dL   Creatinine, Ser 6.04 (H) 0.61 - 1.24 mg/dL   Calcium 8.8 (L) 8.9 - 10.3 mg/dL   Total Protein 7.0 6.5 - 8.1 g/dL   Albumin 3.4 (L) 3.5 - 5.0 g/dL   AST 32 15 - 41 U/L   ALT 22 0 - 44 U/L   Alkaline Phosphatase 55 38 - 126 U/L   Total Bilirubin 0.9 0.0 - 1.2 mg/dL   GFR, Estimated >54 >09 mL/min    Comment: (NOTE) Calculated using the CKD-EPI Creatinine Equation (2021)    Anion gap 12 5 - 15    Comment: Performed at [facility], [address] [place], [place] [address]  Magnesium     Status: None   Collection Time: 11/06/23  2:50 AM  Result Value Ref Range   Magnesium 2.2 1.7 - 2.4 mg/dL    Comment: Performed at [facility], [address], [place], [place] [address]  Phosphorus     Status: None   Collection Time: 11/06/23  2:50 AM  Result Value Ref Range   Phosphorus 4.1 2.5 - 4.6 mg/dL    Comment: Performed at [facility], [address], [place], [place] [address]  Glucose, capillary     Status: Abnormal   Collection Time: 11/06/23  3:26 AM  Result Value Ref Range    Glucose-Capillary 155 (H) 70 - 99 mg/dL    Comment: Glucose reference range applies only to samples taken after fasting for at least 8 hours.  Glucose, capillary     Status: Abnormal   Collection Time: 11/06/23  8:21 AM  Result Value Ref Range   Glucose-Capillary 158 (H) 70 - 99 mg/dL    Comment: Glucose reference range applies only to samples taken after fasting for at least 8 hours.   Comment 1 Notify RN    Comment 2 Document in Chart   Glucose, capillary     Status: Abnormal   Collection Time: 11/06/23 11:34 AM  Result Value Ref Range   Glucose-Capillary 198 (H) 70 - 99 mg/dL    Comment: Glucose reference range applies only to samples taken after fasting for at least 8 hours.   Comment 1 Notify RN    Comment 2 Document in Chart   Glucose, capillary     Status: Abnormal   Collection Time: 11/06/23  3:06 PM  Result Value Ref Range   Glucose-Capillary 135 (H) 70 - 99 mg/dL    Comment: Glucose reference range applies only to samples taken after fasting for at least 8 hours.   Comment 1 Notify RN    Comment 2 Document in Chart   Magnesium     Status: None   Collection Time: 11/06/23  4:43 PM  Result Value Ref Range   Magnesium 2.3 1.7 - 2.4 mg/dL    Comment: Performed at [facility], [address], [place], [place] [address]  Phosphorus  Status: Abnormal   Collection Time: 11/06/23  4:43 PM  Result Value Ref Range   Phosphorus 4.7 (H) 2.5 - 4.6 mg/dL    Comment: Performed at [facility], [address] [place], [place] [address]  Glucose, capillary     Status: Abnormal   Collection Time: 11/06/23  7:36 PM  Result Value Ref Range   Glucose-Capillary 144 (H) 70 - 99 mg/dL    Comment: Glucose reference range applies only to samples taken after fasting for at least 8 hours.  Glucose, capillary     Status: Abnormal   Collection Time: 11/06/23 11:20 PM  Result Value Ref Range   Glucose-Capillary 167 (H) 70 - 99 mg/dL    Comment: Glucose  reference range applies only to samples taken after fasting for at least 8 hours.  Glucose, capillary     Status: Abnormal   Collection Time: 11/07/23  4:12 AM  Result Value Ref Range   Glucose-Capillary 138 (H) 70 - 99 mg/dL    Comment: Glucose reference range applies only to samples taken after fasting for at least 8 hours.  Basic metabolic panel with GFR     Status: Abnormal   Collection Time: 11/07/23  5:57 AM  Result Value Ref Range   Sodium 144 135 - 145 mmol/L    Comment: DELTA CHECK NOTED   Potassium 4.0 3.5 - 5.1 mmol/L   Chloride 104 98 - 111 mmol/L   CO2 30 22 - 32 mmol/L   Glucose, Bld 128 (H) 70 - 99 mg/dL    Comment: Glucose reference range applies only to samples taken after fasting for at least 8 hours.   BUN 42 (H) 6 - 20 mg/dL   Creatinine, Ser 9.56 (H) 0.61 - 1.24 mg/dL   Calcium 9.1 8.9 - 21.3 mg/dL   GFR, Estimated >08 >65 mL/min    Comment: (NOTE) Calculated using the CKD-EPI Creatinine Equation (2021)    Anion gap 10 5 - 15    Comment: Performed at [facility], [address] [place], [place] [address]  CBC     Status: Abnormal   Collection Time: 11/07/23  5:57 AM  Result Value Ref Range   WBC 15.5 (H) 4.0 - 10.5 K/uL   RBC 3.36 (L) 4.22 - 5.81 MIL/uL   Hemoglobin 10.6 (L) 13.0 - 17.0 g/dL   HCT 62.9 (L) 52.8 - 41.3 %   MCV 106.5 (H) 80.0 - 100.0 fL   MCH 31.5 26.0 - 34.0 pg   MCHC 29.6 (L) 30.0 - 36.0 g/dL   RDW 24.4 01.0 - 27.2 %   Platelets 341 150 - 400 K/uL   nRBC 0.0 0.0 - 0.2 %    Comment: Performed at [facility], [address], [place], [place] [address]  Triglycerides     Status: Abnormal   Collection Time: 11/07/23  5:57 AM  Result Value Ref Range   Triglycerides 588 (H) <150 mg/dL    Comment: Performed at [facility], [address] [place], [place] [address]  Magnesium     Status: Abnormal   Collection Time: 11/07/23  5:57 AM  Result Value Ref Range   Magnesium 2.5 (H) 1.7  - 2.4 mg/dL    Comment: Performed at [facility], [address], [place], [place] [address]  Phosphorus     Status: None   Collection Time: 11/07/23  5:57 AM  Result Value Ref Range   Phosphorus 3.8 2.5 -  4.6 mg/dL    Comment: Performed at [facility], [address] [place], [place] [address]  Glucose, capillary     Status: Abnormal   Collection Time: 11/07/23  7:34 AM  Result Value Ref Range   Glucose-Capillary 136 (H) 70 - 99 mg/dL    Comment: Glucose reference range applies only to samples taken after fasting for at least 8 hours.    Imaging / Studies: DG Abd 1 View Result Date: 11/07/2023 CLINICAL DATA:  Ileus. EXAM: ABDOMEN - 1 VIEW COMPARISON:  Abdominal radiograph yesterday FINDINGS: Enteric tube remains in place. No convincing small bowel distension. There is mild gaseous distension of transverse colon. Soft tissue attenuation from habitus limits assessment. IMPRESSION: Mild gaseous distension of transverse colon. No convincing small bowel distension. Technically limited exam due to habitus. Electronically Signed   By: Narda Rutherford M.D.   On: 11/07/2023 00:24   VAS [place] LOWER EXTREMITY VENOUS (DVT) Result Date: 11/06/2023  Lower Venous DVT Study Patient Name:  DUY LEMMING  Date of Exam:   11/06/2023 Medical Rec #: [id number]        Accession #:    [id number] Date of Birth: 02-Sep-1970        Patient Gender: M Patient Age:   54 years Exam Location[facility]  [facility] Procedure:      VAS [place] LOWER EXTREMITY VENOUS (DVT) Referring Phys: Joneen Roach --------------------------------------------------------------------------------  Indications: Possible PE.  Risk Factors: Immobility Cancer Kidney Surgery Partial nephrectomy 10/31/23 obesity. Limitations: Patient movement. Comparison Study: No significant changes seen since previous exam 11/03/23. Performing Technologist: Shona Simpson  Examination Guidelines: A complete evaluation includes B-mode  imaging, spectral Doppler, color Doppler, and power Doppler as needed of all accessible portions of each vessel. Bilateral testing is considered an integral part of a complete examination. Limited examinations for reoccurring indications may be performed as noted. The reflux portion of the exam is performed with the patient in reverse Trendelenburg.  +---------+---------------+---------+-----------+----------+--------------+ RIGHT    CompressibilityPhasicitySpontaneityPropertiesThrombus Aging +---------+---------------+---------+-----------+----------+--------------+ CFV      Full           Yes      Yes                                 +---------+---------------+---------+-----------+----------+--------------+ SFJ      Full                                                        +---------+---------------+---------+-----------+----------+--------------+ FV Prox  Full                                                        +---------+---------------+---------+-----------+----------+--------------+ FV Mid   Full                                                        +---------+---------------+---------+-----------+----------+--------------+ FV DistalFull                                                        +---------+---------------+---------+-----------+----------+--------------+  PFV      Full                                                        +---------+---------------+---------+-----------+----------+--------------+ POP      Full           Yes      Yes                                 +---------+---------------+---------+-----------+----------+--------------+ PTV      Full                    Yes                                 +---------+---------------+---------+-----------+----------+--------------+ PERO     Full                    Yes                                 +---------+---------------+---------+-----------+----------+--------------+    +---------+---------------+---------+-----------+----------+--------------+ LEFT     CompressibilityPhasicitySpontaneityPropertiesThrombus Aging +---------+---------------+---------+-----------+----------+--------------+ CFV      Full           Yes      Yes                                 +---------+---------------+---------+-----------+----------+--------------+ SFJ      Full                                                        +---------+---------------+---------+-----------+----------+--------------+ FV Prox  Full                                                        +---------+---------------+---------+-----------+----------+--------------+ FV Mid   Full                                                        +---------+---------------+---------+-----------+----------+--------------+ FV DistalFull                                                        +---------+---------------+---------+-----------+----------+--------------+ PFV      Full                                                        +---------+---------------+---------+-----------+----------+--------------+  POP      Full           Yes      Yes                                 +---------+---------------+---------+-----------+----------+--------------+ PTV      Full                    Yes                                 +---------+---------------+---------+-----------+----------+--------------+ PERO     Full                    Yes                                 +---------+---------------+---------+-----------+----------+--------------+     Summary: BILATERAL: - No evidence of deep vein thrombosis seen in the lower extremities, bilaterally. -No evidence of popliteal cyst, bilaterally.   *See table(s) above for measurements and observations. Electronically signed by Sherald Hess MD on 11/06/2023 at 11:52:04 AM.    Final    DG Chest Port 1 View Result Date: 11/06/2023 CLINICAL  DATA:  Hypoxia EXAM: PORTABLE CHEST 1 VIEW COMPARISON:  11/05/2023 FINDINGS: Endotracheal tube with tip at the clavicular heads. An enteric tube at least reaches the stomach. Extensive bilateral airspace disease with low lung volumes is unchanged. No effusion or air leak is seen. Cardiomegaly and vascular pedicle widening accentuated by rotation. IMPRESSION: Unremarkable hardware with stable low volume chest and extensive airspace disease. Electronically Signed   By: Tiburcio Pea M.D.   On: 11/06/2023 09:36   CT Angio Chest Pulmonary Embolism (PE) W or WO Contrast Result Date: 11/05/2023 CLINICAL DATA:  Respiratory failure. EXAM: CT ANGIOGRAPHY CHEST WITH CONTRAST TECHNIQUE: Multidetector CT imaging of the chest was performed using the standard protocol during bolus administration of intravenous contrast. Multiplanar CT image reconstructions and MIPs were obtained to evaluate the vascular anatomy. RADIATION DOSE REDUCTION: This exam was performed according to the departmental dose-optimization program which includes automated exposure control, adjustment of the mA and/or kV according to patient size and/or use of iterative reconstruction technique. CONTRAST:  75mL OMNIPAQUE IOHEXOL 350 MG/ML SOLN COMPARISON:  Dec 19, 2021. FINDINGS: Cardiovascular: Mild cardiomegaly is noted. No definite evidence of large central pulmonary embolus seen in the main pulmonary artery or proximal portions of the left and right pulmonary arteries. However, due to persistent respiratory motion artifact, smaller and more peripheral pulmonary emboli cannot be excluded on the basis of this exam, particularly in the lower lobe branches. Mediastinum/Nodes: Endotracheal tube is seen in grossly good position. Nasogastric tube is seen passing through esophagus into stomach. No significant adenopathy is noted. Thyroid gland is unremarkable. Lungs/Pleura: No pneumothorax or pleural effusion is noted. Bilateral upper lobe opacities are noted  concerning for pneumonia. Emphysematous disease is noted. Right lower lobe airspace opacity is noted concerning for pneumonia or atelectasis. Mild left posterior basilar subsegmental atelectasis is noted. Upper Abdomen: No acute abnormality. Musculoskeletal: No chest wall abnormality. No acute or significant osseous findings. Review of the MIP images confirms the above findings. IMPRESSION: No definite evidence of large central pulmonary embolus seen in the main pulmonary artery or proximal portions of the left and right pulmonary arteries. However,  due to persistent respiratory motion artifact, smaller and more peripheral pulmonary emboli cannot be excluded on the basis of this exam. Bilateral upper lobe and right lower lobe pneumonia is noted. Aortic Atherosclerosis (ICD10-I70.0) and Emphysema (ICD10-J43.9). Electronically Signed   By: Lupita Raider M.D.   On: 11/05/2023 14:44    Medications / Allergies: per chart  Antibiotics: Anti-infectives (From admission, onward)    Start     Dose/Rate Route Frequency Ordered Stop   11/05/23 0800  piperacillin-tazobactam (ZOSYN) IVPB 3.375 g        3.375 g 12.5 mL/hr over 240 Minutes Intravenous Every 8 hours 11/05/23 0747     10/31/23 2000  ceFAZolin (ANCEF) IVPB 1 g/50 mL premix        1 g 100 mL/hr over 30 Minutes Intravenous Every 8 hours 10/31/23 1606 11/01/23 0449   10/31/23 0600  ceFAZolin (ANCEF) IVPB 2g/100 mL premix  Status:  Discontinued        2 g 200 mL/hr over 30 Minutes Intravenous On call to O.R. 10/31/23 0536 10/31/23 0538   10/31/23 0536  ceFAZolin (ANCEF) IVPB 2g/100 mL premix        2 g 200 mL/hr over 30 Minutes Intravenous 30 min pre-op 10/31/23 0536 10/31/23 1127         Note: Portions of this report may have been transcribed using voice recognition software. Every effort was made to ensure accuracy; however, inadvertent computerized transcription errors may be present.   Any transcriptional errors that result from this  process are unintentional.    Ardeth Sportsman, MD, FACS, MASCRS Esophageal, Gastrointestinal & Colorectal Surgery Robotic and Minimally Invasive Surgery  [facility] [address] [place], [place] [address] [phone] Fax [phone] Main  CONTACT INFORMATION: Weekday (9AM-5PM): Call [facility] main office at [phone] Weeknight (5PM-9AM) or Weekend/Holiday: Check EPIC "Web Links" tab & use "AMION" (password " [email or web address]") for General Surgery [facility] coverage  Please, DO NOT use SecureChat  (it is not reliable communication to reach operating surgeons & will lead to a delay in care).   Epic staff messaging available for outptient concerns needing 1-2 business day response.      11/07/2023  7:36 AM

## 2023-11-08 ENCOUNTER — Inpatient Hospital Stay (HOSPITAL_COMMUNITY)

## 2023-11-08 DIAGNOSIS — G9340 Encephalopathy, unspecified: Secondary | ICD-10-CM

## 2023-11-08 DIAGNOSIS — Z9889 Other specified postprocedural states: Secondary | ICD-10-CM | POA: Diagnosis not present

## 2023-11-08 DIAGNOSIS — R0902 Hypoxemia: Secondary | ICD-10-CM | POA: Diagnosis not present

## 2023-11-08 DIAGNOSIS — F10239 Alcohol dependence with withdrawal, unspecified: Secondary | ICD-10-CM

## 2023-11-08 DIAGNOSIS — D369 Benign neoplasm, unspecified site: Secondary | ICD-10-CM | POA: Diagnosis present

## 2023-11-08 DIAGNOSIS — J690 Pneumonitis due to inhalation of food and vomit: Secondary | ICD-10-CM | POA: Diagnosis not present

## 2023-11-08 LAB — GLUCOSE, CAPILLARY
Glucose-Capillary: 162 mg/dL — ABNORMAL HIGH (ref 70–99)
Glucose-Capillary: 181 mg/dL — ABNORMAL HIGH (ref 70–99)
Glucose-Capillary: 184 mg/dL — ABNORMAL HIGH (ref 70–99)
Glucose-Capillary: 209 mg/dL — ABNORMAL HIGH (ref 70–99)
Glucose-Capillary: 236 mg/dL — ABNORMAL HIGH (ref 70–99)
Glucose-Capillary: 268 mg/dL — ABNORMAL HIGH (ref 70–99)

## 2023-11-08 LAB — CBC
HCT: 35.3 % — ABNORMAL LOW (ref 39.0–52.0)
Hemoglobin: 10.5 g/dL — ABNORMAL LOW (ref 13.0–17.0)
MCH: 31.3 pg (ref 26.0–34.0)
MCHC: 29.7 g/dL — ABNORMAL LOW (ref 30.0–36.0)
MCV: 105.1 fL — ABNORMAL HIGH (ref 80.0–100.0)
Platelets: 315 10*3/uL (ref 150–400)
RBC: 3.36 MIL/uL — ABNORMAL LOW (ref 4.22–5.81)
RDW: 13.2 % (ref 11.5–15.5)
WBC: 15.2 10*3/uL — ABNORMAL HIGH (ref 4.0–10.5)
nRBC: 0 % (ref 0.0–0.2)

## 2023-11-08 LAB — AMMONIA: Ammonia: 47 umol/L — ABNORMAL HIGH (ref 9–35)

## 2023-11-08 LAB — BLOOD GAS, ARTERIAL
Acid-Base Excess: 8.1 mmol/L — ABNORMAL HIGH (ref 0.0–2.0)
Bicarbonate: 35.1 mmol/L — ABNORMAL HIGH (ref 20.0–28.0)
Drawn by: 20012
FIO2: 40 %
MECHVT: 500 mL
O2 Content: 40 L/min
O2 Saturation: 96 %
PEEP: 8 cmH2O
Patient temperature: 38.7
RATE: 22 {breaths}/min
pCO2 arterial: 63 mmHg — ABNORMAL HIGH (ref 32–48)
pH, Arterial: 7.36 (ref 7.35–7.45)
pO2, Arterial: 81 mmHg — ABNORMAL LOW (ref 83–108)

## 2023-11-08 LAB — BASIC METABOLIC PANEL WITH GFR
Anion gap: 8 (ref 5–15)
BUN: 50 mg/dL — ABNORMAL HIGH (ref 6–20)
CO2: 30 mmol/L (ref 22–32)
Calcium: 8.8 mg/dL — ABNORMAL LOW (ref 8.9–10.3)
Chloride: 107 mmol/L (ref 98–111)
Creatinine, Ser: 1.38 mg/dL — ABNORMAL HIGH (ref 0.61–1.24)
GFR, Estimated: >60 mL/min (ref >60)
Glucose, Bld: 211 mg/dL — ABNORMAL HIGH (ref 70–99)
Potassium: 4 mmol/L (ref 3.5–5.1)
Sodium: 145 mmol/L (ref 135–145)

## 2023-11-08 LAB — MAGNESIUM: Magnesium: 2.1 mg/dL (ref 1.7–2.4)

## 2023-11-08 LAB — TSH: TSH: 1.601 u[IU]/mL (ref 0.350–4.500)

## 2023-11-08 LAB — PROTIME-INR
INR: 1.1 (ref 0.8–1.2)
Prothrombin Time: 14.2 s (ref 11.4–15.2)

## 2023-11-08 LAB — PHOSPHORUS: Phosphorus: 2.9 mg/dL (ref 2.5–4.6)

## 2023-11-08 LAB — TRIGLYCERIDES: Triglycerides: 442 mg/dL — ABNORMAL HIGH (ref <150)

## 2023-11-08 LAB — VITAMIN B12: Vitamin B-12: 166 pg/mL — ABNORMAL LOW (ref 180–914)

## 2023-11-08 MED ORDER — KETAMINE HCL-SODIUM CHLORIDE 1000-0.69 MG/100ML-% IV SOLN
0.5000 mg/kg/h | INTRAVENOUS | Status: DC
  Administered 2023-11-08 – 2023-11-09 (×2): 0.3 mg/kg/h via INTRAVENOUS
  Administered 2023-11-09 – 2023-11-10 (×2): 0.5 mg/kg/h via INTRAVENOUS
  Filled 2023-11-08 (×4): qty 100

## 2023-11-08 MED ORDER — PROSOURCE TF20 ENFIT COMPATIBL EN LIQD
60.0000 mL | Freq: Every day | ENTERAL | Status: DC
  Administered 2023-11-09 – 2023-11-10 (×2): 60 mL
  Filled 2023-11-08 (×2): qty 60

## 2023-11-08 MED ORDER — MIDAZOLAM HCL 2 MG/2ML IJ SOLN
2.0000 mg | INTRAMUSCULAR | Status: AC | PRN
  Administered 2023-11-08 (×2): 2 mg via INTRAVENOUS
  Filled 2023-11-08 (×2): qty 2

## 2023-11-08 MED ORDER — DEXMEDETOMIDINE HCL IN NACL 400 MCG/100ML IV SOLN
0.0000 ug/kg/h | INTRAVENOUS | Status: DC
  Administered 2023-11-08 (×4): 1.3 ug/kg/h via INTRAVENOUS
  Administered 2023-11-08 (×3): 1.2 ug/kg/h via INTRAVENOUS
  Administered 2023-11-09 (×2): 0.9 ug/kg/h via INTRAVENOUS
  Administered 2023-11-09: 1.2 ug/kg/h via INTRAVENOUS
  Administered 2023-11-09: 0.8 ug/kg/h via INTRAVENOUS
  Administered 2023-11-09: 1.2 ug/kg/h via INTRAVENOUS
  Administered 2023-11-09 (×3): 1.3 ug/kg/h via INTRAVENOUS
  Administered 2023-11-10 (×3): 0.8 ug/kg/h via INTRAVENOUS
  Administered 2023-11-10: 0.7 ug/kg/h via INTRAVENOUS
  Administered 2023-11-10: 1 ug/kg/h via INTRAVENOUS
  Administered 2023-11-10: 0.8 ug/kg/h via INTRAVENOUS
  Administered 2023-11-11: 0.7 ug/kg/h via INTRAVENOUS
  Filled 2023-11-08 (×21): qty 100

## 2023-11-08 MED ORDER — PHENOBARBITAL SODIUM 130 MG/ML IJ SOLN: 130.0000 mg | INTRAMUSCULAR | Status: DC | PRN

## 2023-11-08 MED ORDER — KETAMINE BOLUS VIA INFUSION: 0.3500 mg/kg | Freq: Once | INTRAVENOUS | Status: DC

## 2023-11-08 MED ORDER — METOLAZONE 5 MG PO TABS
5.0000 mg | ORAL_TABLET | Freq: Once | ORAL | Status: AC
  Administered 2023-11-08: 5 mg
  Filled 2023-11-08: qty 1

## 2023-11-08 MED ORDER — FUROSEMIDE 10 MG/ML IJ SOLN
80.0000 mg | Freq: Four times a day (QID) | INTRAMUSCULAR | Status: AC
  Administered 2023-11-08 (×2): 80 mg via INTRAVENOUS
  Filled 2023-11-08 (×2): qty 8

## 2023-11-08 MED ORDER — PIVOT 1.5 CAL PO LIQD
1000.0000 mL | ORAL | Status: DC
  Administered 2023-11-08: 1000 mL
  Filled 2023-11-08: qty 1000

## 2023-11-08 MED ORDER — CLONAZEPAM 0.5 MG PO TBDP: 2.0000 mg | ORAL_TABLET | Freq: Three times a day (TID) | ORAL | Status: DC

## 2023-11-08 MED ORDER — PIVOT 1.5 CAL PO LIQD
1000.0000 mL | ORAL | Status: DC
  Administered 2023-11-08 – 2023-11-10 (×4): 1000 mL
  Filled 2023-11-08 (×6): qty 1000

## 2023-11-08 MED ORDER — SODIUM CHLORIDE 0.9 % IV SOLN
3.0000 g | Freq: Four times a day (QID) | INTRAVENOUS | Status: DC
  Administered 2023-11-08: 3 g via INTRAVENOUS
  Filled 2023-11-08: qty 8

## 2023-11-08 MED ORDER — ACETAMINOPHEN 160 MG/5ML PO SOLN
650.0000 mg | Freq: Four times a day (QID) | ORAL | Status: DC
  Administered 2023-11-08 – 2023-11-12 (×16): 650 mg
  Filled 2023-11-08 (×16): qty 20.3

## 2023-11-08 MED ORDER — POLYETHYLENE GLYCOL 3350 17 G PO PACK
17.0000 g | PACK | Freq: Every day | ORAL | Status: DC
  Administered 2023-11-08 – 2023-11-10 (×3): 17 g
  Filled 2023-11-08 (×3): qty 1

## 2023-11-08 MED ORDER — KETAMINE BOLUS VIA INFUSION
0.3500 mg/kg | INTRAVENOUS | Status: DC | PRN
  Administered 2023-11-08 – 2023-11-10 (×5): 45.15 mg via INTRAVENOUS

## 2023-11-08 MED ORDER — IBUPROFEN 100 MG/5ML PO SUSP
600.0000 mg | Freq: Once | ORAL | Status: AC
  Administered 2023-11-08: 600 mg
  Filled 2023-11-08: qty 30

## 2023-11-08 MED ORDER — MIDAZOLAM HCL 2 MG/2ML IJ SOLN
1.0000 mg | INTRAMUSCULAR | Status: DC | PRN
  Administered 2023-11-08 (×3): 2 mg via INTRAVENOUS
  Filled 2023-11-08 (×4): qty 2

## 2023-11-08 MED ORDER — THIAMINE MONONITRATE 100 MG PO TABS: 100.0000 mg | ORAL_TABLET | Freq: Every day | ORAL | Status: DC

## 2023-11-08 MED ORDER — THIAMINE HCL 100 MG/ML IJ SOLN
100.0000 mg | Freq: Every day | INTRAMUSCULAR | Status: DC
  Administered 2023-11-09: 100 mg via INTRAVENOUS
  Filled 2023-11-08: qty 2

## 2023-11-08 MED ORDER — PIPERACILLIN-TAZOBACTAM 3.375 G IVPB
3.3750 g | Freq: Three times a day (TID) | INTRAVENOUS | Status: DC
  Administered 2023-11-08 – 2023-11-10 (×5): 3.375 g via INTRAVENOUS
  Filled 2023-11-08 (×5): qty 50

## 2023-11-08 MED ORDER — MIDAZOLAM HCL 2 MG/2ML IJ SOLN
2.0000 mg | Freq: Once | INTRAMUSCULAR | Status: AC
  Administered 2023-11-08: 2 mg via INTRAVENOUS

## 2023-11-08 NOTE — Inpatient Diabetes Management (Signed)
 Inpatient Diabetes Program Recommendations  AACE/ADA: New Consensus Statement on Inpatient Glycemic Control (2015)  Target Ranges:  Prepandial:   less than 140 mg/dL      Peak postprandial:   less than 180 mg/dL (1-2 hours)      Critically ill patients:  140 - 180 mg/dL   Lab Results  Component Value Date   GLUCAP 236 (H) 11/08/2023   HGBA1C 6.4 (H) 10/31/2023    Review of Glycemic Control  Latest Reference Range & Units 11/07/23 15:37 11/07/23 19:44 11/08/23 00:10 11/08/23 03:01 11/08/23 07:58 11/08/23 11:40  Glucose-Capillary 70 - 99 mg/dL 161 (H) 096 (H) 045 (H) 209 (H) 162 (H) 236 (H)  (H): Data is abnormally high Diabetes history: Type 2 DM Outpatient Diabetes medications: Metformin 1000 mg BID Current orders for Inpatient glycemic control: Novolog 0-15 units Q4H  Inpatient Diabetes Program Recommendations:    Consider adding Novolog 3 units Q4H for tube feed coverage (to be stopped or held in the event tube feeds stopped).   Thanks, Lujean Rave, MSN, RNC-OB Diabetes Coordinator [phone] (8a-5p)

## 2023-11-08 NOTE — Progress Notes (Signed)
 Brief Nutrition Support Update Note  Per [facility], now that off Propofol can likely advance to goal TF today. [facility] added orders for Pivot 1.5 @ 47mL/hr.  Will decrease ProSource now that TF rate higher, will only need 1 packet of ProSource TF20 at rate of 91mL/hr.  -  If able to advance TF and meet kcal needs, would recommend below TF regimen: Pivot 1.5 at 70 ml/h (1680 ml per day) Provides 2520 kcal, 157 gm protein, 1260 ml free water daily  - FWF per CCM/MD.   Shelle Iron RD, LDN Contact via Secure Chat.

## 2023-11-08 NOTE — Progress Notes (Signed)
 eLink Physician-Brief Progress Note Patient Name: Preston [name] DOB: 1970-09-05 MRN: [id number]   Date of Service  11/08/2023  HPI/Events of Note  Patient is on the ventilator and needs restraints order renewed to prevent self-extubation.  eICU Interventions  Restraints order renewed.        Nashay Brickley U Giannie Soliday 11/08/2023, 1:14 AM

## 2023-11-08 NOTE — Progress Notes (Signed)
   11/08/23 1030  [employer]  $ Transport Ventilator  (S)  Yes (Transported pt on vent from ICU #[id number] to CT scan, full support, two full E cylinders, ambu bag present.  ETT stable and secure @ 27 cm.)   Transported back from CT scan to ICU #[id number], safe transport, ETT secure @ 27 cm,  vent check completed, plugged into a red outlet, 02 and air connections secure.

## 2023-11-08 NOTE — Consult Note (Addendum)
 NEUROLOGY CONSULT NOTE   Date of service: November 08, 2023 Patient Name: Preston [name] MRN:  [id number] DOB:  1970/12/22 Chief Complaint: "Upper deviated gaze-concern for seizure" Requesting Provider: Heloise Purpura, MD  History of Present Illness  Preston [name] is a 53 y.o. male with hx of asthma, CHF, COPD, hypertension, hyperlipidemia, alcohol abuse who has been admitted for elective abdominal incisional hernia repair with combination right robotic assisted laparoscopic partial nephrectomy, on postop day was found to be hypoxic and encephalopathic.  Medicine service was consulted and his hypoxemia and altered mentation persisted requiring intubation for airway protection.  In the ICU, it was noted that he is unresponsive on the ventilator with upward forced gaze.  Concern for seizures-neurology consulted. Patient unable to provide any history at this point due to being sedated intubated. Of note, has history of alcohol abuse-at least 12 beers every day.   ROS  Unable to 10 due to patient being sedated intubated  Past History   Past Medical History:  Diagnosis Date   Anxiety    Asthma    daily inhaler   Cancer (HCC)    pt. reports diagnosed with skin cancer on face/uncertain type but he reports may be basal cell, pending surgery to remove 09/03/18   CHF (congestive heart failure) (HCC)    no cardiologist   Chronic cough    states due to COPD   Chronic lower back pain    COPD (chronic obstructive pulmonary disease) (HCC)    no home O2   Diverticulitis 02/26/2013   Dyspnea    with humidity and high pollen count   Edentulous    upper gum   GERD (gastroesophageal reflux disease)    Gout    History of Clostridium difficile infection 2014   Hypercholesterolemia    Hypertension    states under control with med., has been on med. x 4 yr.   Insulin dependent diabetes mellitus    type 2    Osteoarthritis    bilat. hip   Panic attacks    Renal cell cancer, left (HCC) 2012   s/p  partial nephrectomy   Shoulder pain, right 04/18/2017    Past Surgical History:  Procedure Laterality Date   COLON SURGERY     14 inches of colon removed   COLONOSCOPY WITH PROPOFOL N/A 04/01/2013   Procedure: COLONOSCOPY WITH PROPOFOL;  Surgeon: Willis Modena, MD;  Location: [facility];  Service: Endoscopy;  Laterality: N/A;   FLEXIBLE SIGMOIDOSCOPY N/A 02/28/2013   Procedure: FLEXIBLE SIGMOIDOSCOPY;  Surgeon: Vertell Novak., MD;  Location: [facility] ENDOSCOPY;  Service: Endoscopy;  Laterality: N/A;   LAPAROSCOPIC APPENDECTOMY  07/05/2009   LAPAROSCOPIC LOW ANTERIOR RESECTION N/A 07/08/2013   Procedure: LAPAROSCOPIC LOW ANTERIOR RESECTION WITH TAKEDOWN OF SPLENIC FLEXURE.;  Surgeon: Ernestene Mention, MD;  Location: [facility] OR;  Service: General;  Laterality: N/A;   MANDIBLE SURGERY Left 2010   "dry skin pocket cut out"   MULTIPLE EXTRACTIONS WITH ALVEOLOPLASTY N/A 04/24/2017   Procedure: SURGICAL REMOVAL OF TEETH NUMBERS `7 20-29 AND 31;  Surgeon: Vivia Ewing, DMD;  Location: [facility];  Service: Oral Surgery;  Laterality: N/A;   ROBOT ASSISTED LAPAROSCOPIC NEPHRECTOMY Right 10/31/2023   Procedure: XI RIGHT ROBOTIC ASSISTED LAPAROSCOPIC PARTIAL NEPHRECTOMY;  Surgeon: Heloise Purpura, MD;  Location: [facility] ORS;  Service: Urology;  Laterality: Right;  150 MINUTES NEEDED FOR CASE   ROBOTIC ASSITED PARTIAL NEPHRECTOMY Left 05/02/2020   Procedure: XI ROBOTIC ASSITED LAPAROSCOPIC PARTIAL NEPHRECTOMY;  Surgeon:  Heloise Purpura, MD;  Location: [facility];  Service: Urology;  Laterality: Left;   TOTAL HIP ARTHROPLASTY Left 06/25/2016   Procedure: TOTAL HIP ARTHROPLASTY ANTERIOR APPROACH;  Surgeon: Gean Birchwood, MD;  Location: [facility] OR;  Service: Orthopedics;  Laterality: Left;   TOTAL HIP ARTHROPLASTY Right 09/10/2016   Procedure: TOTAL HIP ARTHROPLASTY ANTERIOR APPROACH;  Surgeon: Gean Birchwood, MD;  Location: [facility] OR;  Service: Orthopedics;  Laterality: Right;   XI ROBOTIC ASSISTED VENTRAL HERNIA N/A 10/31/2023    Procedure: ROBOTIC + LAPAROSCOPIC REPAIR OF ABDOMINAL HERNIA WITH MESH, TAP BLOCK - BILATERAL;  Surgeon: Karie Soda, MD;  Location: [facility];  Service: General;  Laterality: N/A;    Family History: Family History  Problem Relation Age of Onset   Cancer Father        lymphoma   Heart attack Mother     Social History  reports that he has been smoking cigarettes. He has a 35 pack-year smoking history. He has quit using smokeless tobacco.  His smokeless tobacco use included chew. He reports that he does not currently use alcohol. He reports that he does not use drugs.  Allergies  Allergen Reactions   Morphine Itching and Nausea And Vomiting   Ace Inhibitors Cough   Adhesive [Tape] Itching and Rash    Medications   Current Facility-Administered Medications:    0.9 %  sodium chloride infusion, 250 mL, Intravenous, PRN, Karie Soda, MD   acetaminophen (TYLENOL) 160 MG/5ML solution 650 mg, 650 mg, Per Tube, Q6H PRN, Duayne Cal, NP, 650 mg at 11/08/23 0900   albuterol (PROVENTIL) (2.5 MG/3ML) 0.083% nebulizer solution 2.5 mg, 2.5 mg, Nebulization, Q6H PRN, Heloise Purpura, MD, 2.5 mg at 11/01/23 1012   allopurinol (ZYLOPRIM) tablet 300 mg, 300 mg, Per Tube, Daily, Heloise Purpura, MD, 300 mg at 11/08/23 0900   amLODipine (NORVASC) tablet 10 mg, 10 mg, Per Tube, Anner Crete, MD, 10 mg at 11/07/23 2143   Ampicillin-Sulbactam (UNASYN) 3 g in sodium chloride 0.9 % 100 mL IVPB, 3 g, Intravenous, Q6H, James, Melissa, RPH   arformoterol (BROVANA) nebulizer solution 15 mcg, 15 mcg, Nebulization, BID, Kc, Ramesh, MD, 15 mcg at 11/08/23 8119   atorvastatin (LIPITOR) tablet 40 mg, 40 mg, Per Tube, QPM, Heloise Purpura, MD, 40 mg at 11/07/23 1713   bisacodyl (DULCOLAX) suppository 10 mg, 10 mg, Rectal, Daily, Gross, Viviann Spare, MD, 10 mg at 11/08/23 0916   budesonide (PULMICORT) nebulizer solution 0.5 mg, 0.5 mg, Nebulization, BID, Bobette Mo, MD, 0.5 mg at 11/08/23 1478   busPIRone  (BUSPAR) tablet 15 mg, 15 mg, Per Tube, BID, Heloise Purpura, MD, 15 mg at 11/08/23 0900   Chlorhexidine Gluconate Cloth 2 % PADS 6 each, 6 each, Topical, Daily, Heloise Purpura, MD, 6 each at 11/08/23 0904   clonazePAM (KLONOPIN) disintegrating tablet 2 mg, 2 mg, Per Tube, BID, Duayne Cal, NP, 2 mg at 11/08/23 0900   dexmedetomidine (PRECEDEX) 400 MCG/100ML (4 mcg/mL) infusion, 0-1.5 mcg/kg/hr, Intravenous, Titrated, Ogan, Okoronkwo U, MD, Last Rate: 37.3 mL/hr at 11/08/23 1114, 1.2 mcg/kg/hr at 11/08/23 1114   DULoxetine (CYMBALTA) DR capsule 60 mg, 60 mg, Oral, QHS, Heloise Purpura, MD, 60 mg at 11/07/23 2144   enoxaparin (LOVENOX) injection 40 mg, 40 mg, Subcutaneous, Q24H, Duayne Cal, NP, 40 mg at 11/07/23 1213   famotidine (PEPCID) tablet 20 mg, 20 mg, Per Tube, BID, Duayne Cal, NP, 20 mg at 11/08/23 0900   feeding supplement (PIVOT 1.5 CAL) liquid 1,000 mL, 1,000  mL, Per Tube, Continuous, Karie Soda, MD, Last Rate: 60 mL/hr at 11/08/23 1114, Infusion Verify at 11/08/23 1114   [START ON 11/09/2023] feeding supplement (PROSource TF20) liquid 60 mL, 60 mL, Per Tube, Daily, Harris, Whitney D, NP   fentaNYL (SUBLIMAZE) bolus via infusion 50-100 mcg, 50-100 mcg, Intravenous, Q15 min PRN, Duayne Cal, NP, 100 mcg at 11/08/23 1035   fentaNYL in NS (21mcg/ml) infusion-PREMIX, 50-200 mcg/hr, Intravenous, Continuous, Duayne Cal, NP, Last Rate: 20 mL/hr at 11/08/23 1114, 200 mcg/hr at 11/08/23 1114   folic acid (FOLVITE) tablet 1 mg, 1 mg, Per Tube, Daily, Heloise Purpura, MD, 1 mg at 11/08/23 0900   furosemide (LASIX) injection 80 mg, 80 mg, Intravenous, Q6H, Charlott Holler, MD   gabapentin (NEURONTIN) 250 MG/5ML solution 900 mg, 900 mg, Per Tube, Q12H, Heloise Purpura, MD, 900 mg at 11/08/23 0916   guaiFENesin-dextromethorphan (ROBITUSSIN DM) 100-10 MG/5ML syrup 15 mL, 15 mL, Per Tube, Q4H PRN, Heloise Purpura, MD   hydrALAZINE (APRESOLINE) injection 10 mg, 10 mg,  Intravenous, Q6H PRN, Karie Fetch P, DO   insulin aspart (novoLOG) injection 0-15 Units, 0-15 Units, Subcutaneous, Q4H, Heloise Purpura, MD, 3 Units at 11/08/23 0847   methylPREDNISolone sodium succinate (SOLU-MEDROL) 40 mg/mL injection 40 mg, 40 mg, Intravenous, Daily, Duayne Cal, NP, 40 mg at 11/08/23 0900   metolazone (ZAROXOLYN) tablet 5 mg, 5 mg, Oral, Once, Charlott Holler, MD   metoprolol tartrate (LOPRESSOR) tablet 50 mg, 50 mg, Per Tube, BID, Heloise Purpura, MD, 50 mg at 11/08/23 0902   midazolam (VERSED) injection 1-2 mg, 1-2 mg, Intravenous, Q1H PRN, Janeann Forehand D, NP, 2 mg at 11/08/23 1035   multivitamin liquid 15 mL, 15 mL, Per Tube, Daily, Heloise Purpura, MD, 15 mL at 11/08/23 0900   naphazoline-glycerin (CLEAR EYES REDNESS) ophth solution 1-2 drop, 1-2 drop, Both Eyes, QID PRN, Karie Soda, MD   nicotine (NICODERM CQ - dosed in mg/24 hours) patch 21 mg, 21 mg, Transdermal, Daily, Crosley, Debby, MD, 21 mg at 11/08/23 0900   ondansetron (ZOFRAN) injection 4 mg, 4 mg, Intravenous, Q4H PRN, Heloise Purpura, MD   Oral care mouth rinse, 15 mL, Mouth Rinse, Q2H, Heloise Purpura, MD, 15 mL at 11/08/23 1113   Oral care mouth rinse, 15 mL, Mouth Rinse, PRN, Heloise Purpura, MD   PHENObarbital (LUMINAL) injection 130 mg, 130 mg, Intravenous, Q8H, Steffanie Dunn, DO, 130 mg at 11/08/23 0520   polycarbophil (FIBERCON) tablet 625 mg, 625 mg, Per Tube, BID, Heloise Purpura, MD, 625 mg at 11/08/23 0900   polyethylene glycol (MIRALAX / GLYCOLAX) packet 17 g, 17 g, Oral, Daily, Charlott Holler, MD   potassium chloride (KLOR-CON) packet 40 mEq, 40 mEq, Per Tube, Once, Duayne Cal, NP   revefenacin (YUPELRI) nebulizer solution 175 mcg, 175 mcg, Nebulization, Daily, Kc, Ramesh, MD, 175 mcg at 11/08/23 0811   sodium chloride flush (NS) 0.9 % injection 3 mL, 3 mL, Intravenous, Q12H, Karie Soda, MD, 3 mL at 11/08/23 0904   sodium chloride flush (NS) 0.9 % injection 3 mL, 3 mL, Intravenous,  PRN, Karie Soda, MD   Melene Muller ON 11/09/2023] thiamine (VITAMIN B1) tablet 100 mg, 100 mg, Per Tube, Daily **OR** [START ON 11/09/2023] thiamine (VITAMIN B1) injection 100 mg, 100 mg, Intravenous, Daily, Ellington, Abby K, RPH  Vitals   Vitals:   11/08/23 1130 11/08/23 1140 11/08/23 1145 11/08/23 1146  BP: 103/71     Pulse:  65 66 66  Resp:  15 12 13   Temp:   99.8 F (37.7 C)   TempSrc:   Esophageal   SpO2:   96% 97%  Weight:      Height:        Body mass index is 35.55 kg/m.  Physical Exam  General: Received fentanyl and Versed in the CT, seen right after CT scan.  Intubated. HEENT: Normocephalic atraumatic Lungs: Vented Cardiovascular: Regular rate rhythm Neurological exam Sedated intubated Initially no spontaneous movements but upon suctioning his ET tube, started localizing with the upper extremity and moved all extremities without any focal weakness Cranial nerves: Pupils are equal, sluggishly reactive to light, gaze is abnormal-left eye is slightly out and up gazing with the right eye is mildly gazing midline.  Does not blink to threat from either sides.  Corneal reflexes present.  Cough and gag present.  Breathing over the ventilator. Motor examination: Moving all 4 spontaneously.  To noxious stimulation in the upper extremities has some withdrawal but the lower extremities noxious stimulation produces mostly triple flexion. Sensation: As above   Labs/Imaging/Neurodiagnostic studies   CBC:  Recent Labs  Lab Nov 17, 2023 0557 11/08/23 0302  WBC 15.5* 15.2*  HGB 10.6* 10.5*  HCT 35.8* 35.3*  MCV 106.5* 105.1*  PLT 341 315   Basic Metabolic Panel:  Lab Results  Component Value Date   NA 145 11/08/2023   K 4.0 11/08/2023   CO2 30 11/08/2023   GLUCOSE 211 (H) 11/08/2023   BUN 50 (H) 11/08/2023   CREATININE 1.38 (H) 11/08/2023   CALCIUM 8.8 (L) 11/08/2023   GFRNONAA >60 11/08/2023   GFRAA >60 05/03/2020   Lipid Panel:  Lab Results  Component Value Date    LDLCALC 64 07/31/2022   HgbA1c:  Lab Results  Component Value Date   HGBA1C 6.4 (H) 10/31/2023   Urine Drug Screen:     Component Value Date/Time   LABOPIA POSITIVE (A) 03/12/2015 0620   COCAINSCRNUR NONE DETECTED 03/12/2015 0620   COCAINSCRNUR NEG 08/05/2013 1055   LABBENZ POSITIVE (A) 03/12/2015 0620   LABBENZ NEG 08/05/2013 1055   AMPHETMU NONE DETECTED 03/12/2015 0620   THCU POSITIVE (A) 03/12/2015 0620   LABBARB NONE DETECTED 03/12/2015 0620    INR  Lab Results  Component Value Date   INR 0.97 08/29/2016   APTT  Lab Results  Component Value Date   APTT 29 08/29/2016   CT Head without contrast(Personally reviewed): No acute findings. Neurodiagnostics rEEG:  Rapid EEG ordered and pending  ASSESSMENT   Preston [name] is a 53 y.o. male with above past medical history who presented to the hospital for elective abdominal incisional hernia repair and commendation with right robotic assisted laparoscopic partial nephrectomy on postop day 1 was found to be hypoxic and encephalopathic which worsened to the point where he required intubation.  In the ICU this morning, he was found to have disconjugate and upward gaze for which neurology was consulted for possible concern for seizures. Head CT is unremarkable for acute process.  Has a history of alcohol abuse.  Alcohol withdrawal seizures always remain in the realm of possibility due to lowering of the seizure threshold from lack of alcohol as well as acute illness. Other possibilities such as aspiration pneumonia causing toxic metabolic encephalopathy also remains in the differential.  Impression: Evaluate for stroke versus toxic metabolic encephalopathy.  RECOMMENDATIONS  Rapid EEG at [facility] let us know when the EEG is hooked up so that we can review. For now, I would  hold off on using additional antiepileptics Agree with CIWA protocol and phenobarbital for alcohol withdrawal. Medical management per  primary team as you are Plan d/w Dossie Arbour PA-c and Dr. Celine Mans at [facility]  ______________________________________________________________________    Signed, Milon Dikes, MD [facility]   CRITICAL CARE ATTESTATION Performed by: Milon Dikes, MD Total critical care time: 35 minutes Critical care time was exclusive of separately billable procedures and treating other patients and/or supervising APPs/Residents/Students Critical care was necessary to treat or prevent imminent or life-threatening deterioration. This patient is critically ill and at significant risk for neurological worsening and/or death and care requires constant monitoring. Critical care was time spent personally by me on the following activities: development of treatment plan with patient and/or surrogate as well as nursing, discussions with consultants, evaluation of patient's response to treatment, examination of patient, obtaining history from patient or surrogate, ordering and performing treatments and interventions, ordering and review of laboratory studies, ordering and review of radiographic studies, pulse oximetry, re-evaluation of patient's condition, participation in multidisciplinary rounds and medical decision making of high complexity in the care of this patient.

## 2023-11-08 NOTE — Progress Notes (Signed)
 Ceribell in place for patient; this nurse informed Dr. Amada Jupiter and Dr. Wilford Corner per Dr. Bess Harvest verbal order.

## 2023-11-08 NOTE — Progress Notes (Signed)
 eLink Physician-Brief Progress Note Patient Name: Preston [name] DOB: Nov 29, 1970 MRN: [id number]   Date of Service  11/08/2023  HPI/Events of Note  I spoke to patient's spouse and updated her regarding findings on the CT abdomen / pelvis and the CT head.  eICU Interventions          Migdalia Dk 11/08/2023, 11:08 PM

## 2023-11-08 NOTE — Progress Notes (Signed)
 eLink Physician-Brief Progress Note Patient Name: Preston [name] DOB: 12-23-1970 MRN: [id number]   Date of Service  11/08/2023  HPI/Events of Note  Temperature 102 degrees despite Tylenol and ice packs.  eICU Interventions  Ibuprofen 600 mg via enteral tube x 1 + PRN cooling blanket ordered.        Thomasene Lot [name] 11/08/2023, 12:57 AM

## 2023-11-08 NOTE — Progress Notes (Addendum)
 Presented with fever off antibiotics.  CT scan done.  Shows a flat loculated hematoma with a few dots of gas in the abdominal wall which is consistent with postoperative hematoma from his giant subcutaneous hernia sac from his incarcerated hernia that required MIS reduction and repair with large mesh.  No recurrent hernias. It is not a rim-enhancing ellipsoid spherical collection = It does not look infected to me.  On exam, he is had no evidence of any purulence or drainage or cellulitis.  I wish to hold off on any intervention on that.  Risk of seeding the mesh or contamination by I&D or drain is greater than the risk of missing out an abscess at this time to my evaluation.  However, should the patient seem to worsen despite antibiotics and other etiologies ruled out, could consider CT guided drain placement to rule out infection..  Would avoid incision and drainage as that risks of mesh exposure.  No intra-abdominal concerns such as obstruction diverticulitis or intra-abdominal perforation or abscess.  Surgery will continue to follow

## 2023-11-08 NOTE — Progress Notes (Signed)
 NAME:  Preston [name], MRN:  [id number], DOB:  March 10, 1971, LOS: 6 ADMISSION DATE:  10/31/2023, CONSULTATION DATE:  11/04/2023 REFERRING MD:  Dr. Lanae Boast - [facility], CHIEF COMPLAINT:  Hypoxia and concern for alcohol    History of Present Illness:  Preston [name] is a 53 y.o. with a past medical history significant for left partial nephrectomy for renal cell carcinoma, abdominal incisional hernia, TN, HLD, CHF, COPD, and GERD who presented for elective abdominal incision hernia repair with combination right robotic assisted laparoscopic partial nephrectomy.  Per chart review it appears patient tolerated procedures well.    Postop day 1 patient was seen with mild hypoxia with SpO2 84% on room air this was felt due to splinting secondary to pain and pulmonary hygiene was encouraged.  Hospitalist service was consulted 4/5 for persistent hypoxemia.   By 4/7 hypoxemia continued to persistent with AMS as well, concerning for ETOH withdrawal, PCCM consulted   Pertinent  Medical History  Left partial nephrectomy for renal cell carcinoma, abdominal incisional hernia, TN, HLD, CHF, COPD, and GERD  Significant Hospital Events: Including procedures, antibiotic start and stop dates in addition to other pertinent events   4/3 admitted for elective hernia repair and partial right nephrectomy  4/4 hypoxic on RA  4/5 [facility] consulted  4/7 Signs of alcohol withdrawal and AMS, intubated overnight for increased work of breathing 4/8 high Fio2/PEEP needs. PNA vs edema. Met ARDS criteria. Zosyn started and diuresed.  4/9: weaned to minimal support, became hypoxic and back to 50% and 10 PEEP 4/11 unresponsive on ventilator this a.m with upward forced gaze., continuous sedation stopped.  Within about an hour patient began spontaneously moving during deep suctioning but upward forced gaze persist.  Head CT and Spot EEG pending  Interim History / Subjective:  Decreased mentation with forced upward gaze Remains on 40% FiO2  and 8 of PEEP  Objective   Blood pressure 110/72, pulse 68, temperature (!) 102.2 F (39 C), temperature source Esophageal, resp. rate 15, height 6\' 3"  (1.905 m), weight 129 kg, SpO2 96%.    Vent Mode: PRVC FiO2 (%):  [40 %] 40 % Set Rate:  [22 bmp] 22 bmp Vt Set:  [500 mL] 500 mL PEEP:  [8 cmH20] 8 cmH20 Plateau Pressure:  [16 cmH20-22 cmH20] 19 cmH20   Intake/Output Summary (Last 24 hours) at 11/08/2023 0706 Last data filed at 11/08/2023 4098 Gross per 24 hour  Intake 2071.03 ml  Output 2925 ml  Net -853.97 ml   Filed Weights   11/06/23 0400 11/07/23 0443 11/08/23 0406  Weight: 124.4 kg 124.2 kg 129 kg    Examination:  General: Acute on chronic ill-appearing deconditioned middle-age male lying in bed on mechanical ventilation in no acute distress HEENT: ETT, MM pink/moist, PERRL,  Neuro: Mentation waxing and waning between unresponsiveness to spontaneous movements seen in upper extremities and head thrashing with forced upward gaze CV: s1s2 regular rate and rhythm, no murmur, rubs, or gallops,  PULM: Dyssynchronous on ventilator, no increased work of breathing no added breath sounds GI: soft, bowel sounds active in all 4 quadrants, non-tender, non-distended, tolerating TF Extremities: warm/dry, no edema  Skin: no rashes or lesions  Resolved Hospital Problem list     Assessment & Plan:  Acute likely on chronic hypoxic respiratory failure Concern for underlying COPD exacerbation Diffuse infiltrates on CXR raising concern for aspiration vs edema vs acute lung injury.  Active tobacco use -Per [facility] evaluation 4/5 patient was seen with expiratory wheezing consistent  with acute COPD exacerbation -Intubated overnight 4/7 for significant increased work of breathing  P: Continue ventilator support with lung protective strategies  Wean PEEP and FiO2 for sats greater than 90%. Head of bed elevated 30 degrees. Plateau pressures less than 30 cm H20.  Follow intermittent chest x-ray  and ABG.   SAT/SBT as tolerated, mentation preclude extubation  Ensure adequate pulmonary hygiene  Follow cultures  VAP bundle in place  PAD protocol Continue Brovana, Pulmicort, and Yupelri Remains on empiric Zosyn day 4 Diurese as able Continue to wean steroids  Acute encephalopathy -AM 4/11 unresponsive on ventilator this a.m with upward forced gaze., continuous sedation stopped.  Within about an hour patient began spontaneously moving during deep suctioning but upward forced gaze persist.   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 HS tops WNL  -Not markedly overloaded on exam.  P: Continuous telemetry Continue statin Strict intake and output Closely monitor renal function and electrolytes Goal of ED euvolemia  Alcohol withdrawal -At baseline patient drinks 12 beers per day P: Librium taper remains in place Continue scheduled clonazepam 2 mg twice daily Remains on home duloxetine and gabapentin Phenobarbital started/9, no taper in place will reassess ability to wean daily Seizure precautions Supplement thiamine, folate, multivitamin  Hyponatremia - improved  P; Trend BMP  Type 2 diabetes  P: Continue SSI CBG goal 140-180 CBG checks every 4  GERD  P: PPI   Best Practice (right click and "Reselect all SmartList Selections" daily)   Diet/type: NPO DVT prophylaxis LMWH Pressure ulcer(s): N/A GI prophylaxis: PPI Lines: N/A Foley:  Yes, and it is still needed Code Status:  full code Last date of multidisciplinary goals of care discussion: Pending    Critical care time:   Performed by: Ezariah Nace D. Harris   Total  critical care time: 42 minutes  Critical care time was exclusive of separately billable procedures and treating other patients.  Critical care was necessary to treat or prevent imminent or life-threatening deterioration.  Critical care was time spent personally by me on the following activities: development of treatment plan with patient and/or surrogate as well as nursing, discussions with consultants, evaluation of patient's response to treatment, examination of patient, obtaining history from patient or surrogate, ordering and performing treatments and interventions, ordering and review of laboratory studies, ordering and review of radiographic studies, pulse oximetry and re-evaluation of patient's condition.  Shakeria Robinette D. Harris, NP-C [facility] Personal contact information can be found on Amion  If no contact or response made please call [phone] 11/08/2023, 7:17 AM

## 2023-11-08 NOTE — Plan of Care (Signed)
  Problem: Clinical Measurements: Goal: Cardiovascular complication will be avoided Outcome: Progressing   Problem: Elimination: Goal: Will not experience complications related to urinary retention Outcome: Progressing   Problem: Fluid Volume: Goal: Ability to maintain a balanced intake and output will improve Outcome: Progressing   Problem: Metabolic: Goal: Ability to maintain appropriate glucose levels will improve Outcome: Progressing   Problem: Clinical Measurements: Goal: Respiratory complications will improve Outcome: Not Progressing   Problem: Activity: Goal: Risk for activity intolerance will decrease Outcome: Not Progressing   Problem: Nutrition: Goal: Adequate nutrition will be maintained Outcome: Not Progressing   Problem: Coping: Goal: Level of anxiety will decrease Outcome: Not Progressing   Problem: Pain Managment: Goal: General experience of comfort will improve and/or be controlled Outcome: Not Progressing   Problem: Safety: Goal: Non-violent Restraint(s) Outcome: Not Progressing

## 2023-11-08 NOTE — Progress Notes (Signed)
 Patient's wife called this AM requesting a palliative care consult. This nurse will inform Dr. Reinaldo Meeker when at bedside.

## 2023-11-08 NOTE — Consult Note (Signed)
 Patient Narrative:  53 year old man with multiple chronic medical problems, DM, HTN, HLD, ETOH use disorder, COPD/active smoker and heart disease who had partial nephrectomy for left renal mass and repair of large ventral incisional hernia with mesh on 4/3. Just prior to discharge on 4/5 low o2 sats were noted and discharge was delayed. That evening he  became confused and encephalopathic. There was a fall event and then possible aspiration with ETOH withdrawal, hypoxia requiring urgent transport to ICU and intubation. He has been difficult to wean due to agitation and encephalopathy. He continues to have fevers.  Palliative Care consulted for goals of care discussion and management of agitation and symptoms.  Recommendations:  Code Status Discussion: DNR w/full scope medical interventions, no CPR/Chest Compressions in the event of death, otherwise use all indicated medical interventions to treat reversible illness. Goals of Care: Met with patient's daughters and wife-we discussed the uncertainty of prognostication. He has many serious chronic medical problems making recovery back to his baseline much more difficult. They describe him as a man who would not want to live if he were dependent on others for his care or with significant debility and disability. He would want to be able to go hunting and ride his motorcycle and at minimum be able to be mobile and go outdoors.This may be a long road for him. His survival outside of this acute critical illness will also depend on abstinence from alcohol and careful management of his chronic disease. ETOH WITHDRAWAL: No prior hx of DTs. Last drink was midnight prior to his surgery/average 18 beers a day. Persistent Encephalopathy. Unclear if he has liver disease, although I strongly suspect this, or the severity. Discontinue all Benzodiazapine's (especially if liver disease or hepatic encephalopathy occurring) Wean off precedex as tolerated-ok to use with  Ketamine. Use Primarily Ketamine and Phenobarbital +/- Fentanyl for vent synchrony and for encephalopathy/pain/agitation/withdrawal Check PT/INR/Check LFTs His Ammonia was elevated on 4/8-suspect cirrhosis and decompensated  Will titrate ketamine for both pain and agitation- added benefit of helping with ETOH withdrawal and no impact on respiratory drive. Maximize phenobarbital dosing for withdrawal. Rifaxamin for ammonia level. Very Large Left Flank Hematoma (not near surgery site). Patient apparently had  fall on 4/7 during hospitalization, it was witnessed  per event note -he crawled over the bedrails and fell on his buttocks per the notes-no further assessment or intervention was done at that time. Patient grimacing and showing signs of pain in that area-will obtain a CT of Abdomen and Pelvis- will start with non-contrasted-but if there is concern for retroperitoneal hematoma or infected hematoma in the setting of persistent fevers, may need contrasted imaging. Pain Control and Sedation: Will initiate CCM ketamine orders, this will be best for his withdrawal and vent dependence. Once ketamine is started can scale back on fentanyl and precedex. Use Phenobarbital scheduled and PRN, he may need higher doses than average. Use phenobarbital instead of benzodiazapine.  Will need minimum of 24-48 hours on ketamine infusion before being able to fully assess his mental status-due to huis liver disease oit will take some time for him to clear all of the benzos he has received.  Will follow for goals of care and progression.  Extensive chart review completed. Case discussed with RN, CCM NP and spiritual care.  Flora Humphreys, DO Palliative Medicine   Time: 90 minutes

## 2023-11-08 NOTE — Progress Notes (Signed)
 eLink Physician-Brief Progress Note Patient Name: Preston [name] DOB: [birth year] MRN: [id number]   Date of Service  11/08/2023  HPI/Events of Note  Patient with sub-optimal sedation on the ventilator.  eICU Interventions  Precedex gtt ceiling increased to 1.5 mcg.        Migdalia Dk 11/08/2023, 6:31 AM

## 2023-11-08 NOTE — Progress Notes (Addendum)
 11/08/2023  Preston [name] [id number] 09-28-1970  CARE TEAM: PCP: Claiborne Rigg, NP  Outpatient Care Team: Patient Care Team: Claiborne Rigg, NP as PCP - General (Nurse Practitioner) Jake Bathe, MD as PCP - Cardiology (Cardiology) Willis Modena, MD as Consulting Physician (Gastroenterology) Karie Soda, MD as Consulting Physician (General Surgery) Heloise Purpura, MD as Consulting Physician (Urology)  Inpatient Treatment Team: Treatment Team:  Heloise Purpura, MD Karie Soda, MD Harrie Foreman, [place], MD Lanae Boast, MD Pccm, Md, MD Aline Brochure, RN Reuben Likes, OT Teresita Madura, RN Brien Few, PT Lynden Ang, [facility] Elmer Sow, RN Vena Austria, RN Nathanial Rancher, RN   Problem List:   Principal Problem:   Neoplasm of right kidney Active Problems:   Tobacco abuse   HLD (hyperlipidemia)   Alcohol abuse   Chronic diastolic congestive heart failure (HCC)   Gastroesophageal reflux disease without esophagitis   Type 2 diabetes mellitus treated without insulin (HCC)   Essential hypertension   Acute kidney injury (HCC)   Obesity, Class II, BMI 35-39.9   Diastasis recti   Incarcerated incisional hernia s/p repair w mesh 10/31/2023   History of Left Renal cell cancer   Hyponatremia   Acute respiratory failure with hypoxia (HCC)   Alcohol withdrawal delirium (HCC)   ABLA (acute blood loss anemia)   Primary hypertension   10/31/2023    POST-OPERATIVE DIAGNOSIS:  VENTRAL INCISIONAL INCARCERATED HERNIA  Dimensions of hernia post-op:  15cm x 7cm   PROCEDURE:   ROBOTIC & LAPAROSCOPIC REPAIR OF ABDOMINAL HERNIA WITH MESH  (See OR Findings below) TAP BLOCK - BILATERAL   SURGEON:  Ardeth Sportsman, MD  OR FINDINGS: Large supraumbilical incisional hernia incarcerated with a foot of transverse colon and greater omentum.  Swiss cheese hernias going down infraumbilically connecting with his prior low midline incision as  well.  Umbilical hernia.   Type of ventral wall repair:  Laparoscopic underlay repair .with Primary repair of largest hernia Placement of mesh: Centrally intraperitoneal with edges tucked into RECTRORECTUS & preperitoneal space Name of mesh: Bard Ventralight dual sided (polypropylene / Seprafilm) Size of mesh: 33x27cm Orientation: Vertical Mesh overlap:  5-7cm  10/31/2023   Postoperative diagnosis: Right renal neoplasm   Procedure:   Right robotic-assisted laparoscopic partial nephrectomy Intraoperative renal ultrasonography   Surgeon: Preston [name]. M.D.   Intraoperative findings:       1. Warm renal ischemia time: 22 minutes       2. Intraoperative renal ultrasound findings: There was a small cystic appearing mass that measured about 1 cm adjacent to a larger renal cyst.   Drains: # 15 Blake perinephric drain  FINAL MICROSCOPIC DIAGNOSIS:  A. 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 PERINEPHRIC FAT, EXCISION: Benign fibroadipose tissue  C. HERNIA SAC: Benign fibromuscular and adipose tissue     Assessment [facility] Stay = 6 days) 8 Days Post-Op    Worsening delirium and confusion and hypoxia most likely due to alcohol withdrawal and fluid overload.  Slowly improving. Plan:  Alcohol withdrawal protocol.  Using Dexmedetomidine now.  Defer to pulmonary critical care.  Seems to be a challenge between very sedated to very agitated.  Ventilatory support.  Elevated BNP with question of infiltrates versus fluid raises concern for at least some respiratory failure with pulmonary edema and possible emphysema/COPD exacerbation.  Responsive  to diuresis. -1.5 L in past 24 hours.  Weaning FiO2 from 70%/PEEP 8 to more normal range 40%/8 guardedly hopeful sign.  Hopefully will gradually improve   CT angiogram argues against any pulmonary embolism.  Concern to filtrates of  possible pneumonia.  On piperacillin/tazobactam per CCM  Defer to extubation to pulmonary critical care.  Bed settings minimal with minimal secretions and diuresed guardedly hopeful sign.  Rectal suppositories with fiber bowel regimen.  Bowel function returning.    BM w low OGT residuals on TFs.  Advance tube feeds to goal.  I think now patient off propofol can increase to 70.  See what critical care nutrition think.  Fiber bowel regimen with rectal suppositories.  Having bowel movements and abdomen soft encouraging.  Agree with diuresis and keep on the dry side.  Abdomen benign with resolving ecchymosis.  No cellulitis or abscess.  No distention or peritonitis.   Pathology on renal mass benign renal papillary adenoma.  Reassuring.  Pain control per critical care.  Challenging in this obese male with chronic pain issues.  Multimodal home regimen Cymbalta, BuSpar, and gabapentin.    Foley catheter for now.  Hopefully can remove once extubated.    monitor electrolytes & replace as needed  Keep K>4, Mg>2, Phos>3  -VTE prophylaxis- SCDs.  Anticoagulation prophyllaxis SQ as appropriate.  Okay to fully anticoagulate if there are concerns  I updated the patient's status to the patient's nurse  Recommendations were made.  Questions were answered.  She expressed understanding & appreciation.  -Disposition: TBD Critical nursing notes family concerned and wondered if palliative care needs to be involved.  I am guarded in withdrawing care in the situation.  While he has been rocky in recovery with heavy alcohol intake, tobacco use, chronic pain issues, we are not close to hitting a point of futility.  He is improved. I will defer to critical care on this but hopefully he can improve and be extubated in the next day or so.  We will see.    I reviewed nursing notes, last 24 h vitals and pain scores, last 48 h intake and output, last 24 h labs and trends, and last 24 h imaging results.  I have  reviewed this patient's available data, including medical history, events of note, test results, etc as part of my evaluation.   A significant portion of that time was spent in counseling. Care during the described time interval was provided by me.  This care required moderate level of medical decision making.  11/08/2023    Subjective: (Chief complaint)  No major events.  Had bowel movement last night.  Tube feeds slowly advancing.  Got agitated on trying to wean sedation so adjusted to Precedex instead of propofol.  Critical care nursing in room.  Objective:  Vital signs:  Vitals:   11/08/23 0400 11/08/23 0406 11/08/23 0500 11/08/23 0600  BP: 111/70  115/75 110/72  Pulse: 69  70 68  Resp: 13  14 15   Temp: (!) 102.2 F (39 C)     TempSrc: Esophageal     SpO2: 96%  98% 96%  Weight:  129 kg    Height:        Last BM Date : 11/07/23  Intake/Output   Yesterday:  04/10 0701 - 04/11 0700 In: 2071 [I.V.:982.3; NG/GT:539; IV Piggyback:549.7] Out: 3625 [Urine:3625] This shift:  Total I/O In: 101 [I.V.:101] Out: -   Bowel function:  Flatus: YES  BM:  YES  Drain: (No drain)  Physical Exam: Clinical ICU nurse team & Dr Laverle Patter in room with me  General: Pt resting intubated and sedated.  no acute distress.  Calm. Eyes: PERRL  Sclera clear.  No icterus Neuro: No focal sensory/motor deficits. Lymph: No head/neck/groin lymphadenopathy Psych: Sedated at this point.  No more agitation/delirium  HENT: Normocephalic, Mucus membranes moist.  No thrush.  Orogastric tube just placed. Neck: Supple, No tracheal deviation.  No obvious thyromegaly Chest: No pain to chest wall compression.  Good respiratory excursion.  No audible wheezing but using abdominal wall to breathe.  Minimal conversational dyspnea. CV:  Pulses intact.  Regular rhythm.  No major extremity edema MS: Normal AROM mjr joints.  No obvious deformity  Abdomen: Obese Soft.  Nondistended.  Nontender.   Moderate ecchymosis near incision sites, especially left side.  slowly resolving.  All dressings off.   No cellulitis or drainage. Mild fullness at supraumbilical midline incision at epicenter of his hernia repair consistent with postoperative hematoma/seroma.   No guarding or peritonitis.    Ext:   No deformity.  No mjr edema.  No cyanosis Skin: No petechiae / purpurea.  No major sores.  Warm and dry    Results:    SURGICAL PATHOLOGY CASE: [id number] PATIENT: Preston [name] Surgical Pathology Report     Clinical History: Right renal mass, ventral incisional incarcerated hernia (jlr)     FINAL MICROSCOPIC DIAGNOSIS:  A. 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 PERINEPHRIC FAT, EXCISION: Benign fibroadipose tissue  C. HERNIA SAC: Benign fibromuscular and adipose tissue     Jameika Kinn DESCRIPTION:  A: Specimen is received fresh and consists of a 4.7 x 2.8 x 1.7 cm piece of tan soft tissue, with a small amount of overlying tan-yellow adipose tissue.  Resection margin is inked black, specimen is serially sectioned reveal tan parenchyma.  There is a 1.0 x 1.0 x 0.9 cm tan-white, solid, well-circumscribed lesion identified.  Lesion abuts the outer capsule, and measures 0.2 cm to the inked resection margin.  A 1.1 x 1.0 x 0.9 cm cortical cyst is identified.  No other lesions are identified. Representative sections are submitted in 5 cassettes. 1-3 = tan-white lesion 4 and 5 = cortical cyst  B: Specimen is received fresh, and consists of a 4.1 x 2.8 x 1.3 cm portion of tan-yellow adipose tissue.  Sectioning reveals grossly unremarkable cut surfaces.  Representative sections are submitted in 1 cassette.  C: Specimen is received fresh and consists of an 8.0 x 7.1 x 2.3 cm aggregate of tan-pink fibromembranous tissue and attached adipose tissue.  Sectioning reveals focally  fibrotic cut surfaces. Representative sections are submitted in 3 cassettes.  Lovey Newcomer 11/01/2023)   Final Diagnosis performed by Marlena Clipper, MD.   Electronically signed 11/04/2023 Technical and / or Professional components performed at [facility], [address] [place], [place] [address].  Immunohistochemistry Technical component (if applicable) was performed at [facility]. [address], [place], [place] [address].   IMMUNOHISTOCHEMISTRY DISCLAIMER (if applicable): Some of these immunohistochemical stains may have been developed and the performance characteristics determine by [facility]. Some may not have been cleared or approved by the U.S. Food and Drug Administration. The FDA has determined that such clearance or approval is not necessary. This test is used for clinical purposes. It should not be regarded as investigational or for research. This laboratory is certified  under the Clinical Laboratory Improvement Amendments of 1988 (CLIA-88) as qualified to perform high complexity clinical laboratory testing.  The controls stained appropriately.   IHC stains are performed on formalin fixed, paraffin embedded tissue using a 3,3"diaminobenzidine (DAB) chromogen and Leica Bond Autostainer System. The staining intensity of the nucleus is score manually and is reported as the percentage of tumor cell nuclei demonstrating specific nuclear staining. The specimens are fixed in 10% Neutral Formalin for at least 6 hours and up to 72hrs. These tests are validated on decalcified tissue. Results should be interpreted with caution given the possibility of false negative results on decalcified specimens. Antibody Clones are as follows ER-clone 45F, PR-clone 16, Ki67- clone MM1. Some of these immunohistochemical stains may have been developed and the performance characteristics determined by [facility].    Cultures: Recent  Results (from the past 720 hours)  MRSA Next Gen by PCR, Nasal     Status: None   Collection Time: 11/04/23  6:08 PM   Specimen: Nasal Mucosa; Nasal Swab  Result Value Ref Range Status   MRSA by PCR Next Gen NOT DETECTED NOT DETECTED Final    Comment: (NOTE) The GeneXpert MRSA Assay (FDA approved for NASAL specimens only), is one component of a comprehensive MRSA colonization surveillance program. It is not intended to diagnose MRSA infection nor to guide or monitor treatment for MRSA infections. Test performance is not FDA approved in patients less than 38 years old. Performed at [facility], [address] [place], [place] [address]   Culture, blood (Routine X 2) w Reflex to ID Panel     Status: None (Preliminary result)   Collection Time: 11/05/23  8:22 AM   Specimen: BLOOD  Result Value Ref Range Status   Specimen Description   Final    BLOOD BLOOD RIGHT ARM AEROBIC BOTTLE ONLY ANAEROBIC BOTTLE ONLY Performed at [facility], [address], [place], [place] [address]    Special Requests   Final    BOTTLES DRAWN AEROBIC AND ANAEROBIC Blood Culture results may not be optimal due to an inadequate volume of blood received in culture bottles Performed at [facility], [address], [place], [place] [address]    Culture   Final    NO GROWTH 3 DAYS Performed at [facility], [address], [place], [place] [address]    Report Status PENDING  Incomplete  Culture, blood (Routine X 2) w Reflex to ID Panel     Status: None (Preliminary result)   Collection Time: 11/05/23  8:22 AM   Specimen: BLOOD  Result Value Ref Range Status   Specimen Description   Final    BLOOD BLOOD RIGHT HAND AEROBIC BOTTLE ONLY ANAEROBIC BOTTLE ONLY Performed at [facility], [address], [place], [place] [address]    Special Requests   Final    BOTTLES DRAWN AEROBIC AND ANAEROBIC Blood Culture adequate  volume Performed at [facility], [address], [place], [place] [address]    Culture   Final    NO GROWTH 3 DAYS Performed at [facility], [address] [place], [place] [address]    Report Status PENDING  Incomplete  Culture, Respiratory w Gram Stain     Status: None   Collection Time: 11/05/23  9:56 AM   Specimen: SPU; Respiratory  Result Value Ref Range Status   Specimen Description   Final    SPUTUM Performed at [facility], [address] [place], [place] [address]    Special Requests   Final    SPUTUM Performed at [facility], [address], [place], [place] [address]    Gram Stain   Final    NO WBC SEEN FEW GRAM POSITIVE COCCI IN PAIRS IN CHAINS    Culture   Final    RARE Normal respiratory flora-no Staph aureus or Pseudomonas seen Performed at [facility], [address] [place], [place] [address]    Report Status 11/07/2023 FINAL  Final    Labs: Results for orders placed or performed during the hospital encounter of 10/31/23 (from the past 48 hours)  Glucose, capillary     Status: Abnormal   Collection Time: 11/06/23  8:21 AM  Result Value Ref Range   Glucose-Capillary 158 (H) 70 - 99 mg/dL    Comment: Glucose reference range applies only to samples taken after fasting for at least 8 hours.   Comment 1 Notify RN    Comment 2 Document in Chart   Glucose, capillary     Status: Abnormal   Collection Time: 11/06/23 11:34 AM  Result Value Ref Range   Glucose-Capillary 198 (H) 70 - 99 mg/dL    Comment: Glucose reference range applies only to samples taken after fasting for at least 8 hours.   Comment 1 Notify RN    Comment 2 Document in Chart   Glucose, capillary     Status: Abnormal   Collection Time: 11/06/23  3:06 PM  Result Value Ref Range   Glucose-Capillary 135 (H) 70 - 99 mg/dL    Comment: Glucose reference range applies only to samples taken after fasting for at least 8  hours.   Comment 1 Notify RN    Comment 2 Document in Chart   Magnesium     Status: None   Collection Time: 11/06/23  4:43 PM  Result Value Ref Range   Magnesium 2.3 1.7 - 2.4 mg/dL    Comment: Performed at [facility], [address], [place], [place] [address]  Phosphorus     Status: Abnormal   Collection Time: 11/06/23  4:43 PM  Result Value Ref Range   Phosphorus 4.7 (H) 2.5 - 4.6 mg/dL    Comment: Performed at [facility], [address], [place], [place] [address]  Glucose, capillary     Status: Abnormal   Collection Time: 11/06/23  7:36 PM  Result Value Ref Range   Glucose-Capillary 144 (H) 70 - 99 mg/dL    Comment: Glucose reference range applies only to samples taken after fasting for at least 8 hours.  Glucose, capillary     Status: Abnormal   Collection Time: 11/06/23 11:20 PM  Result Value Ref Range   Glucose-Capillary 167 (H) 70 - 99 mg/dL    Comment: Glucose reference range applies only to samples taken after fasting for at least 8 hours.  Glucose, capillary     Status: Abnormal   Collection Time: 11/07/23  4:12 AM  Result Value Ref Range   Glucose-Capillary 138 (H) 70 - 99 mg/dL    Comment: Glucose reference range applies only to samples taken after fasting for at least 8 hours.  Basic metabolic panel with GFR     Status: Abnormal   Collection Time: 11/07/23  5:57 AM  Result Value Ref Range   Sodium 144 135 - 145 mmol/L    Comment: DELTA CHECK NOTED   Potassium 4.0  3.5 - 5.1 mmol/L   Chloride 104 98 - 111 mmol/L   CO2 30 22 - 32 mmol/L   Glucose, Bld 128 (H) 70 - 99 mg/dL    Comment: Glucose reference range applies only to samples taken after fasting for at least 8 hours.   BUN 42 (H) 6 - 20 mg/dL   Creatinine, Ser 5.64 (H) 0.61 - 1.24 mg/dL   Calcium 9.1 8.9 - 33.2 mg/dL   GFR, Estimated >95 >18 mL/min    Comment: (NOTE) Calculated using the CKD-EPI Creatinine Equation (2021)    Anion gap 10 5 - 15    Comment:  Performed at [facility], [address] [place], [place] [address]  CBC     Status: Abnormal   Collection Time: 11/07/23  5:57 AM  Result Value Ref Range   WBC 15.5 (H) 4.0 - 10.5 K/uL   RBC 3.36 (L) 4.22 - 5.81 MIL/uL   Hemoglobin 10.6 (L) 13.0 - 17.0 g/dL   HCT 06.3 (L) 01.6 - 01.0 %   MCV 106.5 (H) 80.0 - 100.0 fL   MCH 31.5 26.0 - 34.0 pg   MCHC 29.6 (L) 30.0 - 36.0 g/dL   RDW 93.2 35.5 - 73.2 %   Platelets 341 150 - 400 K/uL   nRBC 0.0 0.0 - 0.2 %    Comment: Performed at [facility], [address] [place], [place] [address]  Triglycerides     Status: Abnormal   Collection Time: 11/07/23  5:57 AM  Result Value Ref Range   Triglycerides 588 (H) <150 mg/dL    Comment: Performed at [facility], [address], [place], [place] [address]  Magnesium     Status: Abnormal   Collection Time: 11/07/23  5:57 AM  Result Value Ref Range   Magnesium 2.5 (H) 1.7 - 2.4 mg/dL    Comment: Performed at [facility], [address] [place], [place] [address]  Phosphorus     Status: None   Collection Time: 11/07/23  5:57 AM  Result Value Ref Range   Phosphorus 3.8 2.5 - 4.6 mg/dL    Comment: Performed at [facility], [address], [place], [place] [address]  Glucose, capillary     Status: Abnormal   Collection Time: 11/07/23  7:34 AM  Result Value Ref Range   Glucose-Capillary 136 (H) 70 - 99 mg/dL    Comment: Glucose reference range applies only to samples taken after fasting for at least 8 hours.  Glucose, capillary     Status: Abnormal   Collection Time: 11/07/23 11:32 AM  Result Value Ref Range   Glucose-Capillary 260 (H) 70 - 99 mg/dL    Comment: Glucose reference range applies only to samples taken after fasting for at least 8 hours.  Glucose, capillary     Status: Abnormal   Collection Time: 11/07/23  3:37 PM  Result Value Ref Range   Glucose-Capillary 205 (H) 70 - 99 mg/dL     Comment: Glucose reference range applies only to samples taken after fasting for at least 8 hours.  Magnesium     Status: None   Collection Time: 11/07/23  4:16 PM  Result Value Ref Range   Magnesium 2.1 1.7 - 2.4 mg/dL    Comment: Performed at [facility], [address] [place], [place] [address]  Phosphorus     Status: None   Collection Time: 11/07/23  4:16 PM  Result Value Ref  Range   Phosphorus 3.2 2.5 - 4.6 mg/dL    Comment: Performed at [facility], [address], [place], [place] [address]  Glucose, capillary     Status: Abnormal   Collection Time: 11/07/23  7:44 PM  Result Value Ref Range   Glucose-Capillary 121 (H) 70 - 99 mg/dL    Comment: Glucose reference range applies only to samples taken after fasting for at least 8 hours.   Comment 1 Notify RN    Comment 2 Document in Chart   Glucose, capillary     Status: Abnormal   Collection Time: 11/08/23 12:10 AM  Result Value Ref Range   Glucose-Capillary 184 (H) 70 - 99 mg/dL    Comment: Glucose reference range applies only to samples taken after fasting for at least 8 hours.   Comment 1 Notify RN    Comment 2 Document in Chart   Glucose, capillary     Status: Abnormal   Collection Time: 11/08/23  3:01 AM  Result Value Ref Range   Glucose-Capillary 209 (H) 70 - 99 mg/dL    Comment: Glucose reference range applies only to samples taken after fasting for at least 8 hours.   Comment 1 Notify RN    Comment 2 Document in Chart   Basic metabolic panel with GFR     Status: Abnormal   Collection Time: 11/08/23  3:02 AM  Result Value Ref Range   Sodium 145 135 - 145 mmol/L   Potassium 4.0 3.5 - 5.1 mmol/L   Chloride 107 98 - 111 mmol/L   CO2 30 22 - 32 mmol/L   Glucose, Bld 211 (H) 70 - 99 mg/dL    Comment: Glucose reference range applies only to samples taken after fasting for at least 8 hours.   BUN 50 (H) 6 - 20 mg/dL   Creatinine, Ser 6.04 (H) 0.61 - 1.24 mg/dL   Calcium 8.8 (L)  8.9 - 10.3 mg/dL   GFR, Estimated >54 >09 mL/min    Comment: (NOTE) Calculated using the CKD-EPI Creatinine Equation (2021)    Anion gap 8 5 - 15    Comment: Performed at [facility], [address], [place], [place] [address]  CBC     Status: Abnormal   Collection Time: 11/08/23  3:02 AM  Result Value Ref Range   WBC 15.2 (H) 4.0 - 10.5 K/uL   RBC 3.36 (L) 4.22 - 5.81 MIL/uL   Hemoglobin 10.5 (L) 13.0 - 17.0 g/dL   HCT 47.8 (L) 29.5 - 62.1 %   MCV 105.1 (H) 80.0 - 100.0 fL   MCH 31.3 26.0 - 34.0 pg   MCHC 29.7 (L) 30.0 - 36.0 g/dL   RDW 30.8 65.7 - 84.6 %   Platelets 315 150 - 400 K/uL   nRBC 0.0 0.0 - 0.2 %    Comment: Performed at [facility], [address] [place], [place] [address]  Magnesium     Status: None   Collection Time: 11/08/23  3:02 AM  Result Value Ref Range   Magnesium 2.1 1.7 - 2.4 mg/dL    Comment: Performed at [facility], [address], [place], [place] [address]  Phosphorus     Status: None   Collection Time: 11/08/23  3:02 AM  Result Value Ref Range   Phosphorus 2.9 2.5 - 4.6 mg/dL    Comment: Performed at [facility], [address] [place], [place] [address]  Triglycerides  Status: Abnormal   Collection Time: 11/08/23  3:02 AM  Result Value Ref Range   Triglycerides 442 (H) <150 mg/dL    Comment: Performed at [facility], [address], [place], [place] [address]    Imaging / Studies: DG Abd 1 View Result Date: 11/07/2023 CLINICAL DATA:  Enteric catheter placement EXAM: ABDOMEN - 1 VIEW COMPARISON:  11/06/2023 FINDINGS: Frontal view of the lower chest and upper abdomen demonstrates enteric catheter passing below diaphragm, tip and side port projecting over the gastric antrum. Nonspecific gaseous distention of the small bowel. Lung bases are clear. IMPRESSION: 1. Enteric catheter tip projecting over the gastric antrum. 2. Nonspecific gaseous  distention of the small bowel, stable. Electronically Signed   By: Sharlet Salina M.D.   On: 11/07/2023 21:24   DG Abd 1 View Result Date: 11/07/2023 CLINICAL DATA:  Ileus. EXAM: ABDOMEN - 1 VIEW COMPARISON:  Abdominal radiograph yesterday FINDINGS: Enteric tube remains in place. No convincing small bowel distension. There is mild gaseous distension of transverse colon. Soft tissue attenuation from habitus limits assessment. IMPRESSION: Mild gaseous distension of transverse colon. No convincing small bowel distension. Technically limited exam due to habitus. Electronically Signed   By: Narda Rutherford M.D.   On: 11/07/2023 00:24   VAS [place] LOWER EXTREMITY VENOUS (DVT) Result Date: 11/06/2023  Lower Venous DVT Study Patient Name:  KRAIG GENIS  Date of Exam:   11/06/2023 Medical Rec #: [id number]        Accession #:    [id number] Date of Birth: 1971-04-04        Patient Gender: M Patient Age:   53 years Exam Location:  [facility] Procedure:      VAS [place] LOWER EXTREMITY VENOUS (DVT) Referring Phys: Joneen Roach --------------------------------------------------------------------------------  Indications: Possible PE.  Risk Factors: Immobility Cancer Kidney Surgery Partial nephrectomy 10/31/23 obesity. Limitations: Patient movement. Comparison Study: No significant changes seen since previous exam 11/03/23. Performing Technologist: Shona Simpson  Examination Guidelines: A complete evaluation includes B-mode imaging, spectral Doppler, color Doppler, and power Doppler as needed of all accessible portions of each vessel. Bilateral testing is considered an integral part of a complete examination. Limited examinations for reoccurring indications may be performed as noted. The reflux portion of the exam is performed with the patient in reverse Trendelenburg.  +---------+---------------+---------+-----------+----------+--------------+ RIGHT    CompressibilityPhasicitySpontaneityPropertiesThrombus Aging  +---------+---------------+---------+-----------+----------+--------------+ CFV      Full           Yes      Yes                                 +---------+---------------+---------+-----------+----------+--------------+ SFJ      Full                                                        +---------+---------------+---------+-----------+----------+--------------+ FV Prox  Full                                                        +---------+---------------+---------+-----------+----------+--------------+ FV Mid   Full                                                        +---------+---------------+---------+-----------+----------+--------------+  FV DistalFull                                                        +---------+---------------+---------+-----------+----------+--------------+ PFV      Full                                                        +---------+---------------+---------+-----------+----------+--------------+ POP      Full           Yes      Yes                                 +---------+---------------+---------+-----------+----------+--------------+ PTV      Full                    Yes                                 +---------+---------------+---------+-----------+----------+--------------+ PERO     Full                    Yes                                 +---------+---------------+---------+-----------+----------+--------------+   +---------+---------------+---------+-----------+----------+--------------+ LEFT     CompressibilityPhasicitySpontaneityPropertiesThrombus Aging +---------+---------------+---------+-----------+----------+--------------+ CFV      Full           Yes      Yes                                 +---------+---------------+---------+-----------+----------+--------------+ SFJ      Full                                                         +---------+---------------+---------+-----------+----------+--------------+ FV Prox  Full                                                        +---------+---------------+---------+-----------+----------+--------------+ FV Mid   Full                                                        +---------+---------------+---------+-----------+----------+--------------+ FV DistalFull                                                        +---------+---------------+---------+-----------+----------+--------------+  PFV      Full                                                        +---------+---------------+---------+-----------+----------+--------------+ POP      Full           Yes      Yes                                 +---------+---------------+---------+-----------+----------+--------------+ PTV      Full                    Yes                                 +---------+---------------+---------+-----------+----------+--------------+ PERO     Full                    Yes                                 +---------+---------------+---------+-----------+----------+--------------+     Summary: BILATERAL: - No evidence of deep vein thrombosis seen in the lower extremities, bilaterally. -No evidence of popliteal cyst, bilaterally.   *See table(s) above for measurements and observations. Electronically signed by Sherald Hess MD on 11/06/2023 at 11:52:04 AM.    Final     Medications / Allergies: per chart  Antibiotics: Anti-infectives (From admission, onward)    Start     Dose/Rate Route Frequency Ordered Stop   11/05/23 0800  piperacillin-tazobactam (ZOSYN) IVPB 3.375 g        3.375 g 12.5 mL/hr over 240 Minutes Intravenous Every 8 hours 11/05/23 0747     10/31/23 2000  ceFAZolin (ANCEF) IVPB 1 g/50 mL premix        1 g 100 mL/hr over 30 Minutes Intravenous Every 8 hours 10/31/23 1606 11/01/23 0449   10/31/23 0600  ceFAZolin (ANCEF) IVPB 2g/100 mL premix   Status:  Discontinued        2 g 200 mL/hr over 30 Minutes Intravenous On call to O.R. 10/31/23 0536 10/31/23 0538   10/31/23 0536  ceFAZolin (ANCEF) IVPB 2g/100 mL premix        2 g 200 mL/hr over 30 Minutes Intravenous 30 min pre-op 10/31/23 0536 10/31/23 1127         Note: Portions of this report may have been transcribed using voice recognition software. Every effort was made to ensure accuracy; however, inadvertent computerized transcription errors may be present.   Any transcriptional errors that result from this process are unintentional.    Ardeth Sportsman, MD, FACS, MASCRS Esophageal, Gastrointestinal & Colorectal Surgery Robotic and Minimally Invasive Surgery  [facility] [address] [place], [place] [address] [phone] Fax [phone] Main  CONTACT INFORMATION: Weekday (9AM-5PM): Call [facility] main office at [phone] Weeknight (5PM-9AM) or Weekend/Holiday: Check EPIC "Web Links" tab & use "AMION" (password " [email or web address]") for General Surgery [facility] coverage  Please, DO NOT use SecureChat  (it is not reliable communication to reach operating surgeons & will lead to a delay in care).  Epic staff messaging available for outptient concerns needing 1-2 business day response.      11/08/2023  7:47 AM

## 2023-11-08 NOTE — Progress Notes (Signed)
 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  eICU Interventions  Versed 2 mg iv Q 2 hours PRN x 2 doses ordered.        Cobi Aldape U Yarisbel Miranda 11/08/2023, 3:06 AM

## 2023-11-08 NOTE — Progress Notes (Signed)
 PCCM Progress Note   This afternoon patient was seen with increased fever curve with temp 102.7 in the setting of increased leukocytosis and ongoing AMS.   Palliative consulted was requested per family and on their evaluation decision was made to obtain a CT scan of the abdomin to completed infection workup with large left flank hematoma and better assess liver disease. Discussion held with attending and decision was made to continue broad spectrum antibiotics and place patient back on Zosyn for now.   Tashawn Greff D. Harris, NP-C [facility] Personal contact information can be found on Amion  If no contact or response made please call [phone] 11/08/2023, 5:20 PM

## 2023-11-08 NOTE — Progress Notes (Signed)
 Patient's wife would like a call from C[facility] doc for update on CT abd.

## 2023-11-08 NOTE — Progress Notes (Signed)
 Patient ID: Preston [name], male   DOB: [birth year], 53 y.o.   MRN: [id number]  8 Days Post-Op Subjective: Pt remains intubated.  BM last night.   Objective: Vital signs in last 24 hours: Temp:  [99.9 F (37.7 C)-102.7 F (39.3 C)] 102.3 F (39.1 C) (04/11 0729) Pulse Rate:  [64-89] 68 (04/11 0729) Resp:  [9-24] 14 (04/11 0729) BP: (110-149)/(70-94) 119/88 (04/11 0729) SpO2:  [90 %-99 %] 97 % (04/11 0729) FiO2 (%):  [40 %] 40 % (04/11 0729) Weight:  [409 kg] 129 kg (04/11 0406)  Intake/Output from previous day: 04/10 0701 - 04/11 0700 In: 2071 [I.V.:982.3; NG/GT:539; IV Piggyback:549.7] Out: 3625 [Urine:3625] Intake/Output this shift: Total I/O In: 101 [I.V.:101] Out: 100 [Urine:100]  Physical Exam:  General: Alert and oriented Abd: Soft, incisions clean  Lab Results: Recent Labs    11/06/23 0250 11/07/23 0557 11/08/23 0302  HGB 9.8* 10.6* 10.5*  HCT 32.5* 35.8* 35.3*   BMET Recent Labs    11/07/23 0557 11/08/23 0302  NA 144 145  K 4.0 4.0  CL 104 107  CO2 30 30  GLUCOSE 128* 211*  BUN 42* 50*  CREATININE 1.30* 1.38*  CALCIUM 9.1 8.8*     Studies/Results:   Assessment/Plan: POD # 8 s/p right RAL partial nephrectomy and incisional hernia repair with mesh - Appreciate pulmonary/critical care management.  Remains intubated due to ARDS. - Path - renal papillary adenoma (benign).  Reviewed with family. - Disposition will be dependent on medical issues.   LOS: 6 days   Crecencio Mc 11/08/2023, 8:09 AM

## 2023-11-08 NOTE — Progress Notes (Signed)
 PT Cancellation Note  Patient Details Name: Preston [name] MRN: [id number] DOB: 1971/06/05   Cancelled Treatment:     Pt deferred this date - pt continues on vent.  Will follow   Dondra Rhett 11/08/2023, 10:35 AM

## 2023-11-08 NOTE — TOC Progression Note (Signed)
 Transition of Care [facility]) - Progression Note    Patient Details  Name: Preston [name] MRN: [id number] Date of Birth: 01-27-1971  [facility]) CM/SW Contact  Amada Jupiter, LCSW Phone Number: 11/08/2023, 11:54 AM  Clinical Narrative:     TOC continues to monitor chart daily for any anticipated dc needs.  Pt remains on vent at this time.  Expected Discharge Plan: (P) Home/Self Care Barriers to Discharge: (P) Continued Medical Work up  Expected Discharge Plan and Services         Expected Discharge Date: 11/02/23                                     Social Determinants of Health (SDOH) Interventions SDOH Screenings   Food Insecurity: No Food Insecurity (11/01/2023)  Recent Concern: Food Insecurity - Food Insecurity Present (10/31/2023)  Housing: Low Risk  (10/31/2023)  Transportation Needs: No Transportation Needs (10/31/2023)  Utilities: Not At Risk (10/31/2023)  Alcohol Screen: Low Risk  (07/11/2023)  Depression (PHQ2-9): Low Risk  (10/14/2023)  Financial Resource Strain: High Risk (07/11/2023)  Physical Activity: Unknown (07/11/2023)  Social Connections: Unknown (07/11/2023)  Stress: Stress Concern Present (07/11/2023)  Tobacco Use: High Risk (11/04/2023)    Readmission Risk Interventions     No data to display

## 2023-11-08 NOTE — Progress Notes (Signed)
 Patient's wife called for an update on the patient. Wife appeared to be upset with the patient's care stating, "I want his sedation turned off and I will come up there to wake him up; he will respond to me. I want him to breath on his own with the ventilator so he can come off of it. I am not happy with his care because he is kept on so much sedation." This nurse attempted to explain the patient's failed WUA (attempted to inform family that it would be uncomfortable and dangerous for [place] to keep all sedation off) but patient's wife would not allow this nurse to speak. Patient's wife stated she would be on the floor in an hour or two and wants to speak to the provider/wake the patient up.

## 2023-11-08 NOTE — Progress Notes (Signed)
 I met with Preston [name]'s wife and two daughters to offer support. They requested prayer for healing and shared about his unexpected complications.

## 2023-11-08 NOTE — Progress Notes (Signed)
 Pharmacy Antibiotic Note  Preston [name] is a 53 y.o. male admitted on 10/31/2023 for surgery, with the diagnosis of right renal mass, ventral incisional incarcerated hernia.  Today, Pharmacy has been consulted for Unasyn dosing for aspiration pneumonia.  Plan: Unasyn 3 g IV every 6 hours Monitor clinical progress and renal function   Height: 6\' 3"  (190.5 cm) Weight: 129 kg (284 lb 6.3 oz) IBW/kg (Calculated) : 84.5  Temp (24hrs), Avg:101.3 F (38.5 C), Min:99.8 F (37.7 C), Max:102.7 F (39.3 C)  Recent Labs  Lab 11/04/23 0340 11/04/23 1210 11/05/23 0256 11/06/23 0250 11/07/23 0557 11/08/23 0302  WBC 17.8*  --  14.5* 14.3* 15.5* 15.2*  CREATININE 1.39* 1.08 1.01 1.25* 1.30* 1.38*    Estimated Creatinine Clearance: 91.6 mL/min (A) (by C-G formula based on SCr of 1.38 mg/dL (H)).    Allergies  Allergen Reactions   Morphine Itching and Nausea And Vomiting   Ace Inhibitors Cough   Adhesive [Tape] Itching and Rash    Antimicrobials this admission: 4/3 Ancef for surgical prophylaxis 4/8 Zosyn >> 4/11 4/11 Unasyn >>  Microbiology results: 4/8 BCx: NGTD 4/8 Sputum: rare normal resp flora-no Staph aureus or Psuedomonas seen  4/7 MRSA PCR: not detected   Thank you for allowing pharmacy to be a part of this patient's care.  Lynden Ang, PharmD, BCPS 11/08/2023 1:02 PM

## 2023-11-09 DIAGNOSIS — J9601 Acute respiratory failure with hypoxia: Secondary | ICD-10-CM | POA: Diagnosis not present

## 2023-11-09 DIAGNOSIS — R0902 Hypoxemia: Secondary | ICD-10-CM | POA: Diagnosis not present

## 2023-11-09 DIAGNOSIS — G9340 Encephalopathy, unspecified: Secondary | ICD-10-CM | POA: Diagnosis not present

## 2023-11-09 DIAGNOSIS — D49511 Neoplasm of unspecified behavior of right kidney: Secondary | ICD-10-CM | POA: Diagnosis not present

## 2023-11-09 DIAGNOSIS — E1165 Type 2 diabetes mellitus with hyperglycemia: Secondary | ICD-10-CM

## 2023-11-09 DIAGNOSIS — R569 Unspecified convulsions: Secondary | ICD-10-CM

## 2023-11-09 DIAGNOSIS — R451 Restlessness and agitation: Secondary | ICD-10-CM

## 2023-11-09 DIAGNOSIS — R4182 Altered mental status, unspecified: Secondary | ICD-10-CM | POA: Diagnosis not present

## 2023-11-09 DIAGNOSIS — Z720 Tobacco use: Secondary | ICD-10-CM | POA: Diagnosis not present

## 2023-11-09 DIAGNOSIS — F10239 Alcohol dependence with withdrawal, unspecified: Secondary | ICD-10-CM | POA: Diagnosis not present

## 2023-11-09 DIAGNOSIS — R918 Other nonspecific abnormal finding of lung field: Secondary | ICD-10-CM | POA: Diagnosis not present

## 2023-11-09 LAB — TRIGLYCERIDES: Triglycerides: 444 mg/dL — ABNORMAL HIGH (ref <150)

## 2023-11-09 LAB — GLUCOSE, CAPILLARY
Glucose-Capillary: 219 mg/dL — ABNORMAL HIGH (ref 70–99)
Glucose-Capillary: 229 mg/dL — ABNORMAL HIGH (ref 70–99)
Glucose-Capillary: 252 mg/dL — ABNORMAL HIGH (ref 70–99)
Glucose-Capillary: 256 mg/dL — ABNORMAL HIGH (ref 70–99)
Glucose-Capillary: 305 mg/dL — ABNORMAL HIGH (ref 70–99)
Glucose-Capillary: 319 mg/dL — ABNORMAL HIGH (ref 70–99)
Glucose-Capillary: 327 mg/dL — ABNORMAL HIGH (ref 70–99)

## 2023-11-09 LAB — PHENOBARBITAL LEVEL: Phenobarbital: 17.1 ug/mL (ref 15.0–40.0)

## 2023-11-09 MED ORDER — INSULIN ASPART 100 UNIT/ML IJ SOLN
0.0000 [IU] | INTRAMUSCULAR | Status: DC
  Administered 2023-11-09: 7 [IU] via SUBCUTANEOUS
  Administered 2023-11-09: 15 [IU] via SUBCUTANEOUS
  Administered 2023-11-09: 3 [IU] via SUBCUTANEOUS
  Administered 2023-11-09 (×2): 15 [IU] via SUBCUTANEOUS
  Administered 2023-11-10: 4 [IU] via SUBCUTANEOUS
  Administered 2023-11-10: 11 [IU] via SUBCUTANEOUS
  Administered 2023-11-10: 4 [IU] via SUBCUTANEOUS
  Administered 2023-11-10 (×2): 11 [IU] via SUBCUTANEOUS
  Administered 2023-11-10: 7 [IU] via SUBCUTANEOUS
  Administered 2023-11-11: 4 [IU] via SUBCUTANEOUS
  Administered 2023-11-11: 11 [IU] via SUBCUTANEOUS
  Administered 2023-11-11: 7 [IU] via SUBCUTANEOUS
  Administered 2023-11-11 (×2): 4 [IU] via SUBCUTANEOUS
  Administered 2023-11-12 (×2): 7 [IU] via SUBCUTANEOUS
  Administered 2023-11-12: 4 [IU] via SUBCUTANEOUS
  Administered 2023-11-12: 7 [IU] via SUBCUTANEOUS
  Administered 2023-11-12: 4 [IU] via SUBCUTANEOUS
  Administered 2023-11-12: 7 [IU] via SUBCUTANEOUS

## 2023-11-09 MED ORDER — RIFAXIMIN 550 MG PO TABS
550.0000 mg | ORAL_TABLET | Freq: Two times a day (BID) | ORAL | Status: DC
Start: 2023-11-09 — End: 2023-11-15
  Administered 2023-11-09 – 2023-11-12 (×7): 550 mg
  Filled 2023-11-09 (×8): qty 1

## 2023-11-09 MED ORDER — CYANOCOBALAMIN 1000 MCG/ML IJ SOLN
1000.0000 ug | Freq: Once | INTRAMUSCULAR | Status: AC
  Administered 2023-11-09: 1000 ug via INTRAMUSCULAR
  Filled 2023-11-09: qty 1

## 2023-11-09 MED ORDER — FUROSEMIDE 10 MG/ML IJ SOLN
80.0000 mg | Freq: Four times a day (QID) | INTRAMUSCULAR | Status: AC
  Administered 2023-11-09 (×2): 80 mg via INTRAVENOUS
  Filled 2023-11-09 (×2): qty 8

## 2023-11-09 MED ORDER — THIAMINE HCL 100 MG/ML IJ SOLN
500.0000 mg | Freq: Three times a day (TID) | INTRAVENOUS | Status: AC
  Administered 2023-11-09 – 2023-11-11 (×6): 500 mg via INTRAVENOUS
  Filled 2023-11-09 (×7): qty 5

## 2023-11-09 MED ORDER — INSULIN GLARGINE-YFGN 100 UNIT/ML ~~LOC~~ SOLN
10.0000 [IU] | Freq: Once | SUBCUTANEOUS | Status: AC
  Administered 2023-11-09: 10 [IU] via SUBCUTANEOUS
  Filled 2023-11-09: qty 0.1

## 2023-11-09 MED ORDER — FUROSEMIDE 10 MG/ML IJ SOLN: 40.0000 mg | Freq: Once | INTRAMUSCULAR | Status: DC

## 2023-11-09 MED ORDER — CYANOCOBALAMIN 1000 MCG/ML IJ SOLN
1000.0000 ug | Freq: Every day | INTRAMUSCULAR | Status: AC
  Administered 2023-11-10 – 2023-11-11 (×2): 1000 ug via INTRAMUSCULAR
  Filled 2023-11-09 (×2): qty 1

## 2023-11-09 MED ORDER — METOLAZONE 5 MG PO TABS
5.0000 mg | ORAL_TABLET | Freq: Once | ORAL | Status: AC
  Administered 2023-11-09: 5 mg
  Filled 2023-11-09: qty 1

## 2023-11-09 NOTE — Progress Notes (Signed)
 9 Days Post-Op Subjective: Intubated and sedated   Objective: Vital signs in last 24 hours: Temp:  [99.1 F (37.3 C)-103 F (39.4 C)] 99.1 F (37.3 C) (04/12 0800) Pulse Rate:  [56-71] 59 (04/12 1000) Resp:  [10-23] 10 (04/12 1000) BP: (98-141)/(57-95) 124/86 (04/12 1000) SpO2:  [93 %-100 %] 95 % (04/12 1134) FiO2 (%):  [40 %-50 %] 40 % (04/12 1134) Weight:  [161 kg] 127 kg (04/12 0500)  Intake/Output from previous day: 04/11 0701 - 04/12 0700 In: 2944.4 [I.V.:1420.3; WR/UE:4540.9; IV Piggyback:229.9] Out: 3400 [Urine:3400] Intake/Output this shift: Total I/O In: 428.8 [I.V.:183.4; NG/GT:245; IV Piggyback:0.4] Out: 450 [Urine:450]  Physical Exam:  Gen: intubated sedated Abd: soft incisions C/D/I GU: catheter in place clear yellow urine  Lab Results: Recent Labs    11/07/23 0557 11/08/23 0302  HGB 10.6* 10.5*  HCT 35.8* 35.3*   BMET Recent Labs    11/07/23 0557 11/08/23 0302  NA 144 145  K 4.0 4.0  CL 104 107  CO2 30 30  GLUCOSE 128* 211*  BUN 42* 50*  CREATININE 1.30* 1.38*  CALCIUM 9.1 8.8*     Studies/Results: Rapid EEG Result Date: 11/09/2023 Arleene Lack, MD     11/09/2023  5:04 AM Patient Name: Preston [name] MRN: [id number] Epilepsy Attending: Arleene Lack Referring Physician/Provider: Deneise Finlay D, NP Duration: 11/08/2023 1136 to 11/09/2023 0500 Patient history: 53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 Rapid EEG to evaluate for seizure Level of alertness: comatose/ lethargic AEDs during EEG study: Phenobarb, GBP Technical aspects: This EEG was obtained using a 10 lead EEG system positioned circumferentially without any parasagittal coverage (rapid EEG). Computer selected EEG is reviewed as  well as background features and all clinically significant events. Description: EEG showed continuous generalized 3 to 6 Hz theta-delta slowing. Hyperventilation and photic stimulation were not performed.   EEG was disconnected between 4/11/025 1707 to 1936  for imaging ABNORMALITY - Continuous slow, generalized IMPRESSION: This limited ceribell EEG is suggestive of moderate to severe diffuse encephalopathy. No seizures or epileptiform discharges were seen throughout the recording. If suspicion for interictal activity remains a concern, a conventional video EEG can be considered. Preston [name]   CT ABDOMEN PELVIS WO CONTRAST Result Date: 11/08/2023 CLINICAL DATA:  Retroperitoneal hematoma, follow up Persistent fever/Large hematoma EXAM: CT ABDOMEN AND PELVIS WITHOUT CONTRAST TECHNIQUE: Multidetector CT imaging of the abdomen and pelvis was performed following the standard protocol without IV contrast. RADIATION DOSE REDUCTION: This exam was performed according to the departmental dose-optimization program which includes automated exposure control, adjustment of the mA and/or kV according to patient size and/or use of iterative reconstruction technique. COMPARISON:  CT abdomen pelvis 08/06/2023 FINDINGS: Lower chest: Bibasilar dependent airspace opacities, right greater than left. Hepatobiliary: Liver enlarged measuring up to 23 cm. No focal liver abnormality. No gallstones, gallbladder wall thickening, or pericholecystic fluid. No biliary dilatation. Pancreas: No focal lesion. Normal pancreatic contour. No surrounding inflammatory changes. No main pancreatic ductal dilatation. Spleen: Normal in size without focal abnormality. Adrenals/Urinary Tract: No adrenal nodule bilaterally. Nonspecific bilateral perinephric fat stranding. No nephrolithiasis and no hydronephrosis. No ureterolithiasis or hydroureter. The urinary bladder is fully decompressed with Foley catheter in good position. Limited evaluation of the urinary bladder due to streak artifact from bilateral femoral surgical hardware. Stomach/Bowel: Enteric tube tip and side port within the gastric lumen. Sigmoid colon resection noted. Stomach is within normal limits. No evidence of bowel wall thickening or  dilatation. Status post appendectomy. Vascular/Lymphatic: Flattened inferior  vena cava. No abdominal aorta or iliac aneurysm. Severe atherosclerotic plaque of the aorta and its branches. No abdominal, pelvic, or inguinal lymphadenopathy. Reproductive: Poorly visualized due to streak artifact originating from the bilateral femoral surgical hardware. Other: Trace volume right retroperitoneum high density free fluid suggestive of blood products (2:61). No intraperitoneal free gas. No organized fluid collection. Musculoskeletal: Supraumbilical ventral wall soft tissue free fluid and emphysema with fluid collection measuring approximately 13 x 3.5 cm. This is in the region of prior ventral wall hernia status post repair. No suspicious lytic or blastic osseous lesions. No acute displaced fracture. Multilevel degenerative changes of the spine. Bilateral total hip arthroplasties partially visualized. IMPRESSION: 1. Bibasilar dependent airspace opacities, right greater than left. Findings could represent a combination of atelectasis and/or infection/inflammation. Limited evaluation on this noncontrast study. 2. Trace volume right retroperitoneal blood products. 3. Supraumbilical ventral wall soft tissue free fluid and emphysema with associated fluid collection measuring approximately 13 x 3.5 cm in the region of prior ventral wall hernia status post repair. No definite recurrent hernia or enterocutaneous fistulization. Finding could represents abscess formation status post surgery with limited evaluation on this noncontrast study. Finding could also represent postsurgical changes-correlate with timing of surgery. Electronically Signed   By: Morgane  Naveau M.D.   On: 11/08/2023 18:51   CT HEAD WO CONTRAST ( ) Result Date: 11/08/2023 CLINICAL DATA:  Provided history: Headache, neuro deficit. EXAM: CT HEAD WITHOUT CONTRAST TECHNIQUE: Contiguous axial images were obtained from the base of the skull through the vertex  without intravenous contrast. RADIATION DOSE REDUCTION: This exam was performed according to the departmental dose-optimization program which includes automated exposure control, adjustment of the mA and/or kV according to patient size and/or use of iterative reconstruction technique. COMPARISON:  Head CT 01/05/2011. FINDINGS: Brain: Generalized parenchymal atrophy. Mild patchy and ill-defined hypoattenuation within the cerebral white matter, nonspecific but most often secondary to chronic small vessel ischemia. There is no acute intracranial hemorrhage. No demarcated cortical infarct. No extra-axial fluid collection. No evidence of an intracranial mass. No midline shift. Vascular: No hyperdense vessel.  Atherosclerotic calcifications. Skull: No calvarial fracture or aggressive osseous lesion. Sinuses/Orbits: No mass or acute finding within the imaged orbits. Mild mucosal thickening within the bilateral maxillary sinuses at the imaged levels. Other: 15 mm cystic-appearing cutaneous/subcutaneous right facial lesion (overlying the right zygomatic arch), possibly an epidermal inclusion cyst. IMPRESSION: 1. No acute intracranial hemorrhage or acute demarcated cortical infarct. 2. Mild cerebral white matter disease, nonspecific but most often secondary to chronic small vessel ischemia. 3. Generalized parenchymal atrophy. 4. Mild bilateral maxillary sinus mucosal thickening. Electronically Signed   By: Bascom Lily D.O.   On: 11/08/2023 11:04   DG Abd 1 View Result Date: 11/07/2023 CLINICAL DATA:  Enteric catheter placement EXAM: ABDOMEN - 1 VIEW COMPARISON:  11/06/2023 FINDINGS: Frontal view of the lower chest and upper abdomen demonstrates enteric catheter passing below diaphragm, tip and side port projecting over the gastric antrum. Nonspecific gaseous distention of the small bowel. Lung bases are clear. IMPRESSION: 1. Enteric catheter tip projecting over the gastric antrum. 2. Nonspecific gaseous distention of the  small bowel, stable. Electronically Signed   By: Bobbye Burrow M.D.   On: 11/07/2023 21:24    Assessment/Plan: POD # 9  s/p right RAL partial nephrectomy and incisional hernia repair with mesh - Appreciate pulmonary/critical care management.  Remains intubated due to ARDS. - Path - renal papillary adenoma (benign).  Reviewed with family. - Disposition will be dependent on medical  issues.   LOS: 7 days   Aimee Houseman MD 11/09/2023, 12:06 PM [facility]

## 2023-11-09 NOTE — Progress Notes (Signed)
 NAME:  Preston [name], MRN:  [id number], DOB:  1971-06-27, LOS: 7 ADMISSION DATE:  10/31/2023, CONSULTATION DATE:  11/04/2023 REFERRING MD:  Dr. Lesa Rape - [facility], CHIEF COMPLAINT:  Hypoxia and concern for alcohol    History of Present Illness:  Preston [name] is a 53 y.o. with a past medical history significant for left partial nephrectomy for renal cell carcinoma, abdominal incisional hernia, TN, HLD, CHF, COPD, and GERD who presented for elective abdominal incision hernia repair with combination right robotic assisted laparoscopic partial nephrectomy.  Per chart review it appears patient tolerated procedures well.    Postop day 1 patient was seen with mild hypoxia with SpO2 84% on room air this was felt due to splinting secondary to pain and pulmonary hygiene was encouraged.  Hospitalist service was consulted 4/5 for persistent hypoxemia.   By 4/7 hypoxemia continued to persistent with AMS as well, concerning for ETOH withdrawal, PCCM consulted   Pertinent  Medical History  Left partial nephrectomy for renal cell carcinoma, abdominal incisional hernia, TN, HLD, CHF, COPD, and GERD  Significant Hospital Events: Including procedures, antibiotic start and stop dates in addition to other pertinent events   4/3 admitted for elective hernia repair and partial right nephrectomy  4/4 hypoxic on RA  4/5 [facility] consulted  4/7 Signs of alcohol withdrawal and AMS, intubated overnight for increased work of breathing 4/8 high Fio2/PEEP needs. PNA vs edema. Met ARDS criteria. Zosyn started and diuresed.  4/9: weaned to minimal support, became hypoxic and back to 50% and 10 PEEP 4/11 unresponsive on ventilator this a.m with upward forced gaze., continuous sedation stopped.  Within about an hour patient began spontaneously moving during deep suctioning but upward forced gaze persist.  Head CT and Spot EEG pending  Interim History / Subjective:  Yesterday ketamine started for encephalopathy/agitation in the  setting of etoh withdrawal CT A/P shows fluid collection. Discussed with surgery - feels this represents post-op hematoma rather than infection. Febrile to 103 yesterday but has defervesced. Having BM  Objective   Blood pressure (!) 141/94, pulse 71, temperature 99.1 F (37.3 C), temperature source Esophageal, resp. rate (!) 22, height 6\' 3"  (1.905 m), weight 127 kg, SpO2 98%.    Vent Mode: PRVC FiO2 (%):  [40 %-50 %] 40 % Set Rate:  [22 bmp] 22 bmp Vt Set:  [500 mL] 500 mL PEEP:  [5 cmH20-8 cmH20] 5 cmH20 Plateau Pressure:  [14 cmH20-19 cmH20] 16 cmH20   Intake/Output Summary (Last 24 hours) at 11/09/2023 0853 Last data filed at 11/09/2023 9562 Gross per 24 hour  Intake 2953.84 ml  Output 3300 ml  Net -346.16 ml   Filed Weights   11/07/23 0443 11/08/23 0406 11/09/23 0500  Weight: 124.2 kg 129 kg 127 kg    Examination:  Gen:      Intubated, sedated, acutely and chronically ill appearing HEENT:  ETT to vent, copious secretions Lungs:    sounds of mechanical ventilation auscultated diminished, no wheeze CV:         RRR Abd:      Distended, soft, demarcated hematoma present Ext:    No edema in extremities Skin:      Warm and dry; no rashes Neuro:   sedated, RASS -3   Resolved Hospital Problem list     Assessment & Plan:  Acute likely on chronic hypoxic respiratory failure Concern for underlying COPD exacerbation Diffuse infiltrates on CXR raising concern for aspiration vs edema vs acute lung injury.  Active tobacco  use -Per [facility] evaluation 4/5 patient was seen with expiratory wheezing consistent with acute COPD exacerbation -Intubated overnight 4/7 for significant increased work of breathing  P: Continue ventilator support with lung protective strategies  Wean PEEP and FiO2 for sats greater than 90%. Head of bed elevated 30 degrees. Plateau pressures less than 30 cm H20.  Follow intermittent chest x-ray and ABG.   SAT/SBT as tolerated, mentation preclude extubation   Ensure adequate pulmonary hygiene  Follow cultures  VAP bundle in place  PAD protocol Continue Brovana, Pulmicort, and Yupelri Remains on empiric Zosyn day 4 Completed steroids for copd exacerbation today  Acute encephalopathy -AM 4/11 unresponsive on ventilator this a.m with upward forced gaze., continuous sedation stopped.  Within about an hour patient began spontaneously moving during deep suctioning but upward forced gaze persist.   P: Minimize sedation as able Stat head CT Spot EEG Neuroprotective measures  Alcohol withdrawal with delirium -At baseline patient drinks 12 beers per day P: Currently on ketamine, scheduled and prn phenobarbital, librium taper and scheduled klonazepam Remains on home duloxetine and gabapentin Phenobarbital started/9, no taper in place will reassess ability to wean daily Seizure precautions Supplement thiamine, folate, multivitamin  Right renal mass -[name]/p partial nephrectomy surgical pathology with renal papillary adenoma(benign) - post op laparascopic ventral hernia repair with mesh placement on 4/3 - post op hematoma vs seroma in the abdominal wall. Discussed with Dr. Hershell Lose would not intervene on this  AKI -resolved P: Continue to closely follow renal function Monitor urine output  Chronic diastolic CHF  -ECHO 4/5 with EF 50-55% with mildly reduced RV function with basal inferior/inferoseptal hypokinesis. HS tops WNL  P: Continuous telemetry Continue statin Strict intake and output Closely monitor renal function and electrolytes Diuresing well, continue lasix and metolazone to maintain euvolemia  Hyponatremia - improved  P; Trend BMP  Type 2 diabetes with steroid induced hyperglycemia P: Continue SSI CBG goal 140-180 CBG checks every 4 Increase to resistant SSI. Expect CBGs to improve once steroids stop today.  GERD  P: PPI   Best Practice (right click and "Reselect all SmartList Selections" daily)   Diet/type: NPO DVT  prophylaxis LMWH Pressure ulcer([name]): N/A GI prophylaxis: PPI Lines: N/A Foley:  Yes, and it is still needed Code Status:  full code Last date of multidisciplinary goals of care discussion: will update wife at bedside today.   Critical care time:    The patient is critically ill due to respiratory failure, encephalopathy.  Critical care was necessary to treat or prevent imminent or life-threatening deterioration.  Critical care was time spent personally by me on the following activities: development of treatment plan with patient and/or surrogate as well as nursing, discussions with consultants, evaluation of patient'[name] response to treatment, examination of patient, obtaining history from patient or surrogate, ordering and performing treatments and interventions, ordering and review of laboratory studies, ordering and review of radiographic studies, pulse oximetry, re-evaluation of patient'[name] condition and participation in multidisciplinary rounds.   Critical Care Time devoted to patient care services described in this note is 45 minutes. This time reflects time of care of this signee Preston [name] Preston [name] . This critical care time does not reflect separately billable procedures or procedure time, teaching time or supervisory time of PA/NP/Med student/Med Resident etc but could involve care discussion time.       Preston [name] Pulmonary and Critical Care Medicine 11/09/2023 8:56 AM  Pager: see AMION  If no response to pager , please call critical  care on call (see AMION) until 7pm After 7:00 pm call Elink

## 2023-11-09 NOTE — Procedures (Addendum)
 Patient Name: SCHON ZEIDERS  MRN: [id number]  Epilepsy Attending: Arleene Lack  Referring Physician/Provider: Deneise Finlay D, NP  Duration: 11/08/2023 1136 to 11/09/2023 1102  Patient history: 52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 Rapid EEG to evaluate for seizure  Level of alertness: comatose/ lethargic   AEDs during EEG study: Phenobarb, GBP  Technical aspects: This EEG was obtained using a 10 lead EEG system positioned circumferentially without any parasagittal coverage (rapid EEG). Computer selected EEG is reviewed as  well as background features and all clinically significant events.  Description: EEG showed continuous generalized 3 to 6 Hz theta-delta slowing.  Hyperventilation and photic stimulation were not performed.     EEG was disconnected between 4/11/025 1707 to 1936 for imaging  ABNORMALITY - Continuous slow, generalized  IMPRESSION: This limited ceribell EEG is suggestive of moderate to severe diffuse encephalopathy. No seizures or epileptiform discharges were seen throughout the recording.  If suspicion for interictal activity remains a concern, a conventional video EEG can be considered.   Saif Peter O Carlyle Mcelrath

## 2023-11-09 NOTE — Progress Notes (Signed)
 PT Cancellation Note  Patient Details Name: Preston [name] MRN: [id number] DOB: 01-20-1971   Cancelled Treatment:     Pt continues on vent and sedated.  PT deferred this date.  Will follow.   [name] 11/09/2023, 7:19 AM

## 2023-11-09 NOTE — Progress Notes (Signed)
   11/09/23 1010  Adult Ventilator Settings  Vent Mode (S)  PRVC (Changed back by RN due to fatigue, increased WOB, pt weaned x 2 hrs PSV 10/5, 40%.)

## 2023-11-09 NOTE — Progress Notes (Signed)
 Progress update  Fentanyl off all day. Still fairly obtunded. Concern for phenobarbital toxicity. Holding scheduled and prn doses. Discussed with neurology. Plan for MRI to evaluate further.   Additional cc time 15 minutes.  Louie Rover, MD Pulmonary and Critical Care Medicine [facility] 11/09/2023 6:01 PM Pager: see AMION  If no response to pager, please call critical care on call (see AMION) until 7pm After 7:00 pm call Elink

## 2023-11-09 NOTE — Plan of Care (Signed)
  Problem: Elimination: Goal: Will not experience complications related to urinary retention Outcome: Progressing   Problem: Metabolic: Goal: Ability to maintain appropriate glucose levels will improve Outcome: Not Progressing Note: Had to increase insulin levels due to increasing glucose levels    Problem: Nutritional: Goal: Maintenance of adequate nutrition will improve Outcome: Progressing   Problem: Clinical Measurements: Goal: Postoperative complications will be avoided or minimized Outcome: Progressing

## 2023-11-09 NOTE — Progress Notes (Signed)
 11/09/2023  Preston [name] [id number] May 10, 1971  CARE TEAM: PCP: Collins Dean, NP  Outpatient Care Team: Patient Care Team: Collins Dean, NP as PCP - General (Nurse Practitioner) Hugh Madura, MD as PCP - Cardiology (Cardiology) Evangeline Hilts, MD as Consulting Physician (Gastroenterology) Candyce Champagne, MD as Consulting Physician (General Surgery) Florencio Hunting, MD as Consulting Physician (Urology)  Inpatient Treatment Team: Treatment Team:  Florencio Hunting, MD Candyce Champagne, MD Carrolyn Clan, [place], MD Lesa Rape, MD Pccm, Md, MD Algis Ingles, MD Early, Midge Albert, [place] Clarissa Croon, RN Unice Gant, PT Jame Maze, OT   Problem List:   Principal Problem:   Neoplasm of right kidney Active Problems:   Tobacco abuse   HLD (hyperlipidemia)   Alcohol abuse   Chronic diastolic congestive heart failure (HCC)   Gastroesophageal reflux disease without esophagitis   Type 2 diabetes mellitus treated without insulin (HCC)   Essential hypertension   Acute kidney injury (HCC)   Obesity, Class II, BMI 35-39.9   Diastasis recti   Incarcerated incisional hernia s/p repair w mesh 10/31/2023   History of Left Renal cell cancer   Hyponatremia   Acute respiratory failure with hypoxia (HCC)   Alcohol withdrawal delirium (HCC)   ABLA (acute blood loss anemia)   Primary hypertension   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  Swiss cheese hernias going down infraumbilically connecting with his prior low midline incision  as well.  Umbilical hernia.   Type of ventral wall repair:  Laparoscopic underlay repair .with Primary repair of largest hernia Placement of mesh: Centrally intraperitoneal with edges tucked into RECTRORECTUS & preperitoneal space Name of mesh: Bard Ventralight dual sided (polypropylene / Seprafilm) Size of mesh: 33x27cm Orientation: Vertical Mesh overlap:  5-7cm  10/31/2023   Postoperative diagnosis: Right renal neoplasm   Procedure:   Right robotic-assisted laparoscopic partial nephrectomy Intraoperative renal ultrasonography   Surgeon: Izetta Marshall. M.D.   Intraoperative findings:       1. Warm renal ischemia time: 22 minutes       2. Intraoperative renal ultrasound findings: There was a small cystic appearing mass that measured about 1 cm adjacent to a larger renal cyst.   Drains: # 15 Blake perinephric drain  FINAL MICROSCOPIC DIAGNOSIS:  A. 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 PERINEPHRIC FAT, EXCISION: Benign fibroadipose tissue  C. HERNIA SAC: Benign fibromuscular and adipose tissue     Assessment [facility] Stay = 7 days) 9 Days Post-Op    Worsening delirium and confusion and hypoxia most likely due to alcohol withdrawal and fluid overload.  Slowly improving. Plan:  Alcohol withdrawal protocol.  Doing Precedex and now ketamine.  Phenobarbital.  Defer to critical care and neurology.  No strong evidence of stroke or seizure disorder.  Hopefully will gradually improve.  Either of uncertain etiology but suction on ET tube seems thick and that raises strong suspicion pneumonia.  Continue IV antibiotics.  Postoperative abdominal wall hematoma/seroma not surprising but no evidence of abscess.  As noted last night, I would hold off on any intervention at this time.  Most I do is percutaneous drainage  Ventilatory support.  Elevated BNP with question of  infiltrates versus fluid raises concern for at least some respiratory failure with pulmonary edema and possible emphysema/COPD exacerbation.  Responsive to diuresis. Vent now on minimal settings.  Consider more Lasix.  Ordered one-time dose this morning.  Defer to extubation to pulmonary critical care.  Vent settings minimal but with some moderate secretions.    Rectal suppositories with fiber bowel regimen.  Bowel function returning.    TF feeds to goal.  I think now patient off propofol can increase to 70.  See what critical care nutrition think.  Fiber bowel regimen with rectal suppositories.  Having bowel movements and abdomen soft encouraging.  Agree with diuresis and keep on the dry side.  Ordered Lasix more time.   Pathology on renal mass benign renal papillary adenoma.  Reassuring.  Pain control per critical care.  Challenging in this obese male with chronic pain issues.  Multimodal home regimen Cymbalta, BuSpar, and gabapentin.    Foley catheter for now.  Hopefully can remove once extubated.    monitor electrolytes & replace as needed  Keep K>4, Mg>2, Phos>3  -VTE prophylaxis- SCDs.  Anticoagulation prophyllaxis SQ as appropriate.  Okay to fully anticoagulate if there are concerns  I updated the patient's status to the patient's nurse  Recommendations were made.  Questions were answered.  She expressed understanding & appreciation.  -Disposition: TBD I think the patient is slowly stabilizing.  Vent settings minimal.  Renal function good.  Bowel function has returned.  The biggest challenges adequate sedation on the vent but not overdoing it.  Appreciate critical neurology and multispecialty care.  Hopefully will gradually improve.  I do not see a point of futility but will defer to critical care service.   I reviewed nursing notes, last 24 h vitals and pain scores, last 48 h intake and output, last 24 h labs and trends, and last 24 h imaging results.  I have reviewed this  patient's available data, including medical history, events of note, test results, etc as part of my evaluation.   A significant portion of that time was spent in counseling. Care during the described time interval was provided by me.  This care required moderate level of medical decision making.  11/09/2023    Subjective: (Chief complaint)  Concern for fever spike off antibiotics.  CAT scan done.  Patient having bowel movements.  Wean down vent.  Nursing notes secretions on ETT suctioning thicker.  No drainage in abdomen.  CT of head negative.  EEG shows no major seizures.  Repeat EEG monitoring in place.  Neurology has been consulted just in case.  Palliative care consulted and on board  Objective:  Vital signs:  Vitals:   11/09/23 0400 11/09/23 0405 11/09/23 0425 11/09/23 0500  BP:      Pulse:      Resp:      Temp: (!) 100.7 F (38.2 C)     TempSrc: Esophageal     SpO2:  93% 100%   Weight:    127 kg  Height:        Last BM Date : 11/07/23  Intake/Output   Yesterday:  04/11 0701 - 04/12 0700 In: 2903 [I.V.:1402.3; NG/GT:1270.8; IV Piggyback:229.9] Out: 3400 [Urine:3400] This shift:  Total I/O In: 1636.3 [I.V.:686.3;  NG/GT:851.2; IV Piggyback:98.9] Out: 1300 [Urine:1300]  Bowel function:  Flatus: YES  BM:  YES  Drain: (No drain)   Physical Exam: Clinical ICU nurse team & Dr Rozanne Corners in room with me  General: Pt resting intubated and sedated.  no acute distress.  Calm. Eyes: PERRL  Sclera clear.  No icterus Neuro: No focal sensory/motor deficits. Lymph: No head/neck/groin lymphadenopathy Psych: Sedated at this point.  No more agitation/delirium  HENT: Normocephalic, Mucus membranes moist.  No thrush.  Orogastric tube just placed. Neck: Supple, No tracheal deviation.  No obvious thyromegaly Chest: No pain to chest wall compression.  Good respiratory excursion.  No audible wheezing but using abdominal wall to breathe.  Minimal conversational  dyspnea. CV:  Pulses intact.  Regular rhythm.  No major extremity edema MS: Normal AROM mjr joints.  No obvious deformity  Abdomen: Obese Soft.  Nondistended.  Nontender.  Ecchymosis abdominal nearly resolved.  Incisions clean dry intact.  No cellulitis or drainage.  No fluctuance.  No guarding or peritonitis.  Argues against infection.   Ext:   No deformity.  No mjr edema.  No cyanosis Skin: No petechiae / purpurea.  No major sores.  Warm and dry    Results:    SURGICAL PATHOLOGY CASE: [id number] PATIENT: Preston [name] Surgical Pathology Report     Clinical History: Right renal mass, ventral incisional incarcerated hernia (jlr)     FINAL MICROSCOPIC DIAGNOSIS:  A. 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 PERINEPHRIC FAT, EXCISION: Benign fibroadipose tissue  C. HERNIA SAC: Benign fibromuscular and adipose tissue     Sahirah Rudell DESCRIPTION:  A: Specimen is received fresh and consists of a 4.7 x 2.8 x 1.7 cm piece of tan soft tissue, with a small amount of overlying tan-yellow adipose tissue.  Resection margin is inked black, specimen is serially sectioned reveal tan parenchyma.  There is a 1.0 x 1.0 x 0.9 cm tan-white, solid, well-circumscribed lesion identified.  Lesion abuts the outer capsule, and measures 0.2 cm to the inked resection margin.  A 1.1 x 1.0 x 0.9 cm cortical cyst is identified.  No other lesions are identified. Representative sections are submitted in 5 cassettes. 1-3 = tan-white lesion 4 and 5 = cortical cyst  B: Specimen is received fresh, and consists of a 4.1 x 2.8 x 1.3 cm portion of tan-yellow adipose tissue.  Sectioning reveals grossly unremarkable cut surfaces.  Representative sections are submitted in 1 cassette.  C: Specimen is received fresh and consists of an 8.0 x 7.1 x 2.3 cm aggregate of tan-pink fibromembranous tissue and  attached adipose tissue.  Sectioning reveals focally fibrotic cut surfaces. Representative sections are submitted in 3 cassettes.  Jeffrey Mini 11/01/2023)   Final Diagnosis performed by Zhaoli Lane, MD.   Electronically signed 11/04/2023 Technical and / or Professional components performed at [facility], [address], [place], [place] [address].  Immunohistochemistry Technical component (if applicable) was performed at [facility]. [address], [place], [place] [address].   IMMUNOHISTOCHEMISTRY DISCLAIMER (if applicable): Some of these immunohistochemical stains may have been developed and the performance characteristics determine by [facility]. Some may not have been cleared or approved by the U.S. Food and Drug Administration. The FDA has determined that such clearance or approval is not necessary. This test is used for clinical purposes. It should not be regarded as investigational or for research.  This laboratory is certified under the Clinical Laboratory Improvement Amendments of 1988 (CLIA-88) as qualified to perform high complexity clinical laboratory testing.  The controls stained appropriately.   IHC stains are performed on formalin fixed, paraffin embedded tissue using a 3,3"diaminobenzidine (DAB) chromogen and Leica Bond Autostainer System. The staining intensity of the nucleus is score manually and is reported as the percentage of tumor cell nuclei demonstrating specific nuclear staining. The specimens are fixed in 10% Neutral Formalin for at least 6 hours and up to 72hrs. These tests are validated on decalcified tissue. Results should be interpreted with caution given the possibility of false negative results on decalcified specimens. Antibody Clones are as follows ER-clone 63F, PR-clone 16, Ki67- clone MM1. Some of these immunohistochemical stains may have been developed and the performance characteristics determined  by [facility].    Cultures: Recent Results (from the past 720 hours)  MRSA Next Gen by PCR, Nasal     Status: None   Collection Time: 11/04/23  6:08 PM   Specimen: Nasal Mucosa; Nasal Swab  Result Value Ref Range Status   MRSA by PCR Next Gen NOT DETECTED NOT DETECTED Final    Comment: (NOTE) The GeneXpert MRSA Assay (FDA approved for NASAL specimens only), is one component of a comprehensive MRSA colonization surveillance program. It is not intended to diagnose MRSA infection nor to guide or monitor treatment for MRSA infections. Test performance is not FDA approved in patients less than 35 years old. Performed at [facility], [address] [place], [place] [address]   Culture, blood (Routine X 2) w Reflex to ID Panel     Status: None (Preliminary result)   Collection Time: 11/05/23  8:22 AM   Specimen: BLOOD  Result Value Ref Range Status   Specimen Description   Final    BLOOD BLOOD RIGHT ARM AEROBIC BOTTLE ONLY ANAEROBIC BOTTLE ONLY Performed at [facility], [address], [place], [place] [address]    Special Requests   Final    BOTTLES DRAWN AEROBIC AND ANAEROBIC Blood Culture results may not be optimal due to an inadequate volume of blood received in culture bottles Performed at [facility], [address] [place], [place] [address]    Culture   Final    NO GROWTH 3 DAYS Performed at [facility], [address] [place], [place] [address]    Report Status PENDING  Incomplete  Culture, blood (Routine X 2) w Reflex to ID Panel     Status: None (Preliminary result)   Collection Time: 11/05/23  8:22 AM   Specimen: BLOOD  Result Value Ref Range Status   Specimen Description   Final    BLOOD BLOOD RIGHT HAND AEROBIC BOTTLE ONLY ANAEROBIC BOTTLE ONLY Performed at [facility], [address] [place], [place] [address]    Special Requests   Final    BOTTLES DRAWN AEROBIC  AND ANAEROBIC Blood Culture adequate volume Performed at [facility], [address] [place], [place] [address]    Culture   Final    NO GROWTH 3 DAYS Performed at [facility], [address] [place], [place] [address]    Report Status PENDING  Incomplete  Culture, Respiratory w Gram Stain     Status: None   Collection Time: 11/05/23  9:56 AM   Specimen: SPU; Respiratory  Result Value Ref Range Status   Specimen Description   Final  SPUTUM Performed at [facility], [address] [place], [place] [address]    Special Requests   Final    SPUTUM Performed at [facility], [address], [place], [place] [address]    Gram Stain   Final    NO WBC SEEN FEW GRAM POSITIVE COCCI IN PAIRS IN CHAINS    Culture   Final    RARE Normal respiratory flora-no Staph aureus or Pseudomonas seen Performed at [facility], [address], [place], [place] [address]    Report Status 11/07/2023 FINAL  Final    Labs: Results for orders placed or performed during the hospital encounter of 10/31/23 (from the past 48 hours)  Glucose, capillary     Status: Abnormal   Collection Time: 11/07/23  7:34 AM  Result Value Ref Range   Glucose-Capillary 136 (H) 70 - 99 mg/dL    Comment: Glucose reference range applies only to samples taken after fasting for at least 8 hours.  Glucose, capillary     Status: Abnormal   Collection Time: 11/07/23 11:32 AM  Result Value Ref Range   Glucose-Capillary 260 (H) 70 - 99 mg/dL    Comment: Glucose reference range applies only to samples taken after fasting for at least 8 hours.  Glucose, capillary     Status: Abnormal   Collection Time: 11/07/23  3:37 PM  Result Value Ref Range   Glucose-Capillary 205 (H) 70 - 99 mg/dL    Comment: Glucose reference range applies only to samples taken after fasting for at least 8 hours.  Magnesium     Status: None   Collection Time: 11/07/23  4:16 PM   Result Value Ref Range   Magnesium 2.1 1.7 - 2.4 mg/dL    Comment: Performed at [facility], [address], [place], [place] [address]  Phosphorus     Status: None   Collection Time: 11/07/23  4:16 PM  Result Value Ref Range   Phosphorus 3.2 2.5 - 4.6 mg/dL    Comment: Performed at [facility], [address], [place], [place] [address]  Glucose, capillary     Status: Abnormal   Collection Time: 11/07/23  7:44 PM  Result Value Ref Range   Glucose-Capillary 121 (H) 70 - 99 mg/dL    Comment: Glucose reference range applies only to samples taken after fasting for at least 8 hours.   Comment 1 Notify RN    Comment 2 Document in Chart   Glucose, capillary     Status: Abnormal   Collection Time: 11/08/23 12:10 AM  Result Value Ref Range   Glucose-Capillary 184 (H) 70 - 99 mg/dL    Comment: Glucose reference range applies only to samples taken after fasting for at least 8 hours.   Comment 1 Notify RN    Comment 2 Document in Chart   Glucose, capillary     Status: Abnormal   Collection Time: 11/08/23  3:01 AM  Result Value Ref Range   Glucose-Capillary 209 (H) 70 - 99 mg/dL    Comment: Glucose reference range applies only to samples taken after fasting for at least 8 hours.   Comment 1 Notify RN    Comment 2 Document in Chart   Basic metabolic panel with GFR     Status: Abnormal   Collection Time: 11/08/23  3:02 AM  Result Value Ref Range   Sodium 145 135 - 145 mmol/L   Potassium 4.0 3.5 - 5.1 mmol/L  Chloride 107 98 - 111 mmol/L   CO2 30 22 - 32 mmol/L   Glucose, Bld 211 (H) 70 - 99 mg/dL    Comment: Glucose reference range applies only to samples taken after fasting for at least 8 hours.   BUN 50 (H) 6 - 20 mg/dL   Creatinine, Ser 3.08 (H) 0.61 - 1.24 mg/dL   Calcium 8.8 (L) 8.9 - 10.3 mg/dL   GFR, Estimated >65 >78 mL/min    Comment: (NOTE) Calculated using the CKD-EPI Creatinine Equation (2021)    Anion gap 8 5 - 15    Comment:  Performed at [facility], [address], [place], [place] [address]  CBC     Status: Abnormal   Collection Time: 11/08/23  3:02 AM  Result Value Ref Range   WBC 15.2 (H) 4.0 - 10.5 K/uL   RBC 3.36 (L) 4.22 - 5.81 MIL/uL   Hemoglobin 10.5 (L) 13.0 - 17.0 g/dL   HCT 95.2 (L) 84.1 - 32.4 %   MCV 105.1 (H) 80.0 - 100.0 fL   MCH 31.3 26.0 - 34.0 pg   MCHC 29.7 (L) 30.0 - 36.0 g/dL   RDW 40.1 02.7 - 25.3 %   Platelets 315 150 - 400 K/uL   nRBC 0.0 0.0 - 0.2 %    Comment: Performed at [facility], [address], [place], [place] [address]  Magnesium     Status: None   Collection Time: 11/08/23  3:02 AM  Result Value Ref Range   Magnesium 2.1 1.7 - 2.4 mg/dL    Comment: Performed at [facility], [address] [place], [place] [address]  Phosphorus     Status: None   Collection Time: 11/08/23  3:02 AM  Result Value Ref Range   Phosphorus 2.9 2.5 - 4.6 mg/dL    Comment: Performed at [facility], [address] [place], [place] [address]  Triglycerides     Status: Abnormal   Collection Time: 11/08/23  3:02 AM  Result Value Ref Range   Triglycerides 442 (H) <150 mg/dL    Comment: Performed at [facility], [address], [place], [place] [address]  Glucose, capillary     Status: Abnormal   Collection Time: 11/08/23  7:58 AM  Result Value Ref Range   Glucose-Capillary 162 (H) 70 - 99 mg/dL    Comment: Glucose reference range applies only to samples taken after fasting for at least 8 hours.   Comment 1 Notify RN    Comment 2 Document in Chart   Blood gas, arterial     Status: Abnormal   Collection Time: 11/08/23  8:23 AM  Result Value Ref Range   FIO2 40 %   O2 Content 40.0 L/min   Delivery systems VENTILATOR    Mode PRESSURE REGULATED VOLUME CONTROL    MECHVT 500 mL   RATE 22 resp/min   PEEP 8 cm H20   pH, Arterial 7.36 7.35 - 7.45   pCO2 arterial 63 (H) 32 - 48 mmHg   pO2,  Arterial 81 (L) 83 - 108 mmHg   Bicarbonate 35.1 (H) 20.0 - 28.0 mmol/L   Acid-Base Excess 8.1 (H) 0.0 - 2.0 mmol/L   O2 Saturation 96 %   Patient temperature 38.7    Collection site LEFT BRACHIAL    Drawn by [id number]    Allens test (pass/fail) PASS PASS    Comment: Performed at [facility], [address] [place], [place] [address]  Glucose, capillary     Status: Abnormal   Collection Time: 11/08/23 11:40 AM  Result Value Ref Range   Glucose-Capillary 236 (H) 70 - 99 mg/dL    Comment: Glucose reference range applies only to samples taken after fasting for at least 8 hours.   Comment 1 Notify RN    Comment 2 Document in Chart   Glucose, capillary     Status: Abnormal   Collection Time: 11/08/23  4:31 PM  Result Value Ref Range   Glucose-Capillary 268 (H) 70 - 99 mg/dL    Comment: Glucose reference range applies only to samples taken after fasting for at least 8 hours.   Comment 1 Notify RN    Comment 2 Document in Chart   Protime-INR     Status: None   Collection Time: 11/08/23  5:55 PM  Result Value Ref Range   Prothrombin Time 14.2 11.4 - 15.2 seconds   INR 1.1 0.8 - 1.2    Comment: (NOTE) INR goal varies based on device and disease states. Performed at [facility], [address], [place], [place] [address]   Vitamin B12     Status: Abnormal   Collection Time: 11/08/23  5:55 PM  Result Value Ref Range   Vitamin B-12 166 (L) 180 - 914 pg/mL    Comment: (NOTE) This assay is not validated for testing neonatal or myeloproliferative syndrome specimens for Vitamin B12 levels. Performed at [facility], [address], [place], [place] [address]   TSH     Status: None   Collection Time: 11/08/23  5:55 PM  Result Value Ref Range   TSH 1.601 0.350 - 4.500 uIU/mL    Comment: Performed by a 3rd Generation assay with a functional sensitivity of <=0.01 uIU/mL. Performed at [facility], [address], [place], [place] [address]   Ammonia     Status: Abnormal   Collection Time: 11/08/23  5:55 PM  Result Value Ref Range   Ammonia 47 (H) 9 - 35 umol/L    Comment: Performed at [facility], [address] [place], [place] [address]  Glucose, capillary     Status: Abnormal   Collection Time: 11/08/23  8:04 PM  Result Value Ref Range   Glucose-Capillary 181 (H) 70 - 99 mg/dL    Comment: Glucose reference range applies only to samples taken after fasting for at least 8 hours.  Glucose, capillary     Status: Abnormal   Collection Time: 11/09/23 12:09 AM  Result Value Ref Range   Glucose-Capillary 219 (H) 70 - 99 mg/dL    Comment: Glucose reference range applies only to samples taken after fasting for at least 8 hours.  Glucose, capillary     Status: Abnormal   Collection Time: 11/09/23  3:59 AM  Result Value Ref Range   Glucose-Capillary 256 (H) 70 - 99 mg/dL    Comment: Glucose reference range applies only to samples taken after fasting for at least 8 hours.    Imaging / Studies: Rapid EEG Result Date: 11/09/2023 Arleene Lack, MD     11/09/2023  5:04 AM Patient Name: Preston [name] MRN: [id number] Epilepsy Attending: Arleene Lack Referring Physician/Provider: Deneise Finlay D, NP Duration: 11/08/2023 1136 to 11/09/2023 0500 Patient history: 52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 Rapid EEG to evaluate for seizure Level of alertness: comatose/ lethargic AEDs during EEG study: Phenobarb, GBP Technical aspects: This EEG was  obtained using a 10 lead EEG system positioned circumferentially without any parasagittal coverage (rapid EEG). Computer selected EEG is reviewed as  well as background features and all clinically significant events. Description: EEG showed continuous generalized 3 to 6 Hz theta-delta slowing. Hyperventilation and photic stimulation were not performed.   EEG was disconnected between 4/11/025 1707 to 1936 for imaging ABNORMALITY - Continuous slow,  generalized IMPRESSION: This limited ceribell EEG is suggestive of moderate to severe diffuse encephalopathy. No seizures or epileptiform discharges were seen throughout the recording. If suspicion for interictal activity remains a concern, a conventional video EEG can be considered. [name]   CT ABDOMEN PELVIS WO CONTRAST Result Date: 11/08/2023 CLINICAL DATA:  Retroperitoneal hematoma, follow up Persistent fever/Large hematoma EXAM: CT ABDOMEN AND PELVIS WITHOUT CONTRAST TECHNIQUE: Multidetector CT imaging of the abdomen and pelvis was performed following the standard protocol without IV contrast. RADIATION DOSE REDUCTION: This exam was performed according to the departmental dose-optimization program which includes automated exposure control, adjustment of the mA and/or kV according to patient size and/or use of iterative reconstruction technique. COMPARISON:  CT abdomen pelvis 08/06/2023 FINDINGS: Lower chest: Bibasilar dependent airspace opacities, right greater than left. Hepatobiliary: Liver enlarged measuring up to 23 cm. No focal liver abnormality. No gallstones, gallbladder wall thickening, or pericholecystic fluid. No biliary dilatation. Pancreas: No focal lesion. Normal pancreatic contour. No surrounding inflammatory changes. No main pancreatic ductal dilatation. Spleen: Normal in size without focal abnormality. Adrenals/Urinary Tract: No adrenal nodule bilaterally. Nonspecific bilateral perinephric fat stranding. No nephrolithiasis and no hydronephrosis. No ureterolithiasis or hydroureter. The urinary bladder is fully decompressed with Foley catheter in good position. Limited evaluation of the urinary bladder due to streak artifact from bilateral femoral surgical hardware. Stomach/Bowel: Enteric tube tip and side port within the gastric lumen. Sigmoid colon resection noted. Stomach is within normal limits. No evidence of bowel wall thickening or dilatation. Status post appendectomy.  Vascular/Lymphatic: Flattened inferior vena cava. No abdominal aorta or iliac aneurysm. Severe atherosclerotic plaque of the aorta and its branches. No abdominal, pelvic, or inguinal lymphadenopathy. Reproductive: Poorly visualized due to streak artifact originating from the bilateral femoral surgical hardware. Other: Trace volume right retroperitoneum high density free fluid suggestive of blood products (2:61). No intraperitoneal free gas. No organized fluid collection. Musculoskeletal: Supraumbilical ventral wall soft tissue free fluid and emphysema with fluid collection measuring approximately 13 x 3.5 cm. This is in the region of prior ventral wall hernia status post repair. No suspicious lytic or blastic osseous lesions. No acute displaced fracture. Multilevel degenerative changes of the spine. Bilateral total hip arthroplasties partially visualized. IMPRESSION: 1. Bibasilar dependent airspace opacities, right greater than left. Findings could represent a combination of atelectasis and/or infection/inflammation. Limited evaluation on this noncontrast study. 2. Trace volume right retroperitoneal blood products. 3. Supraumbilical ventral wall soft tissue free fluid and emphysema with associated fluid collection measuring approximately 13 x 3.5 cm in the region of prior ventral wall hernia status post repair. No definite recurrent hernia or enterocutaneous fistulization. Finding could represents abscess formation status post surgery with limited evaluation on this noncontrast study. Finding could also represent postsurgical changes-correlate with timing of surgery. Electronically Signed   By: Morgane  Naveau M.D.   On: 11/08/2023 18:51   CT HEAD WO CONTRAST ( ) Result Date: 11/08/2023 CLINICAL DATA:  Provided history: Headache, neuro deficit. EXAM: CT HEAD WITHOUT CONTRAST TECHNIQUE: Contiguous axial images were obtained from the base of the skull through the vertex without intravenous contrast. RADIATION  DOSE REDUCTION:  This exam was performed according to the departmental dose-optimization program which includes automated exposure control, adjustment of the mA and/or kV according to patient size and/or use of iterative reconstruction technique. COMPARISON:  Head CT 01/05/2011. FINDINGS: Brain: Generalized parenchymal atrophy. Mild patchy and ill-defined hypoattenuation within the cerebral white matter, nonspecific but most often secondary to chronic small vessel ischemia. There is no acute intracranial hemorrhage. No demarcated cortical infarct. No extra-axial fluid collection. No evidence of an intracranial mass. No midline shift. Vascular: No hyperdense vessel.  Atherosclerotic calcifications. Skull: No calvarial fracture or aggressive osseous lesion. Sinuses/Orbits: No mass or acute finding within the imaged orbits. Mild mucosal thickening within the bilateral maxillary sinuses at the imaged levels. Other: 15 mm cystic-appearing cutaneous/subcutaneous right facial lesion (overlying the right zygomatic arch), possibly an epidermal inclusion cyst. IMPRESSION: 1. No acute intracranial hemorrhage or acute demarcated cortical infarct. 2. Mild cerebral white matter disease, nonspecific but most often secondary to chronic small vessel ischemia. 3. Generalized parenchymal atrophy. 4. Mild bilateral maxillary sinus mucosal thickening. Electronically Signed   By: Bascom Lily D.O.   On: 11/08/2023 11:04   DG Abd 1 View Result Date: 11/07/2023 CLINICAL DATA:  Enteric catheter placement EXAM: ABDOMEN - 1 VIEW COMPARISON:  11/06/2023 FINDINGS: Frontal view of the lower chest and upper abdomen demonstrates enteric catheter passing below diaphragm, tip and side port projecting over the gastric antrum. Nonspecific gaseous distention of the small bowel. Lung bases are clear. IMPRESSION: 1. Enteric catheter tip projecting over the gastric antrum. 2. Nonspecific gaseous distention of the small bowel, stable. Electronically  Signed   By: Bobbye Burrow M.D.   On: 11/07/2023 21:24    Medications / Allergies: per chart  Antibiotics: Anti-infectives (From admission, onward)    Start     Dose/Rate Route Frequency Ordered Stop   11/08/23 1815  piperacillin-tazobactam (ZOSYN) IVPB 3.375 g        3.375 g 12.5 mL/hr over 240 Minutes Intravenous Every 8 hours 11/08/23 1722     11/08/23 1245  Ampicillin-Sulbactam (UNASYN) 3 g in sodium chloride 0.9 % 100 mL IVPB  Status:  Discontinued        3 g 200 mL/hr over 30 Minutes Intravenous Every 6 hours 11/08/23 1157 11/08/23 1717   11/05/23 0800  piperacillin-tazobactam (ZOSYN) IVPB 3.375 g  Status:  Discontinued        3.375 g 12.5 mL/hr over 240 Minutes Intravenous Every 8 hours 11/05/23 0747 11/08/23 1151   10/31/23 2000  ceFAZolin (ANCEF) IVPB 1 g/50 mL premix        1 g 100 mL/hr over 30 Minutes Intravenous Every 8 hours 10/31/23 1606 11/01/23 0449   10/31/23 0600  ceFAZolin (ANCEF) IVPB 2g/100 mL premix  Status:  Discontinued        2 g 200 mL/hr over 30 Minutes Intravenous On call to O.R. 10/31/23 0536 10/31/23 0538   10/31/23 0536  ceFAZolin (ANCEF) IVPB 2g/100 mL premix        2 g 200 mL/hr over 30 Minutes Intravenous 30 min pre-op 10/31/23 0536 10/31/23 1127         Note: Portions of this report may have been transcribed using voice recognition software. Every effort was made to ensure accuracy; however, inadvertent computerized transcription errors may be present.   Any transcriptional errors that result from this process are unintentional.    Eddye Goodie, MD, FACS, MASCRS Esophageal, Gastrointestinal & Colorectal Surgery Robotic and Minimally Invasive Surgery  [facility] [address], [address] [place], [place] [address] [phone] Fax [phone] Main  CONTACT INFORMATION: Weekday (9AM-5PM): Call [facility] main office at [phone] Weeknight (5PM-9AM) or Weekend/Holiday: Check  EPIC "Web Links" tab & use "AMION" (password " [email or web address]") for General Surgery [facility] coverage  Please, DO NOT use SecureChat  (it is not reliable communication to reach operating surgeons & will lead to a delay in care).   Epic staff messaging available for outptient concerns needing 1-2 business day response.      11/09/2023  6:40 AM

## 2023-11-09 NOTE — Progress Notes (Addendum)
 NEUROLOGY CONSULT FOLLOW UP NOTE   Date of service: November 09, 2023 Patient Name: Preston [name] MRN:  [id number] DOB:  1971-07-13  Interval Hx/subjective    He is less agitated than today, but also comatose.  Sedation is being weaned.  Vitals   Vitals:   11/09/23 1300 11/09/23 1400 11/09/23 1500 11/09/23 1539  BP: 109/75 99/61 (!) 92/58   Pulse: (!) 52 95 (!) 50   Resp: (!) 22 (!) 22 (!) 22   Temp:  (!) 97 F (36.1 C)    TempSrc:  Esophageal    SpO2: 98% 96% 96% 96%  Weight:      Height:         Body mass index is 35 kg/m.  Physical Exam   Constitutional: Appears well-developed and well-nourished.  Neurologic Examination    MS: Does not open eyes or follow commands. CN: Pupils are reactive bilaterally, corneals are intact, no response to doll's eye maneuver, eyes are slightly disconjugate, cough is absent Motor: No response to noxious stimulation including nailbed pressure in any extremity Sensory: As above  Medications  Current Facility-Administered Medications:    0.9 %  sodium chloride infusion, 250 mL, Intravenous, PRN, Candyce Champagne, MD   acetaminophen (TYLENOL) 160 MG/5ML solution 650 mg, 650 mg, Per Tube, QID, Candyce Champagne, MD, 650 mg at 11/09/23 1343   albuterol (PROVENTIL) (2.5 MG/3ML) 0.083% nebulizer solution 2.5 mg, 2.5 mg, Nebulization, Q6H PRN, Florencio Hunting, MD, 2.5 mg at 11/01/23 1012   allopurinol (ZYLOPRIM) tablet 300 mg, 300 mg, Per Tube, Daily, Florencio Hunting, MD, 300 mg at 11/09/23 0953   amLODipine (NORVASC) tablet 10 mg, 10 mg, Per Tube, QHS, Borden, Lester, MD, 10 mg at 11/08/23 2254   arformoterol (BROVANA) nebulizer solution 15 mcg, 15 mcg, Nebulization, BID, Kc, Ramesh, MD, 15 mcg at 11/09/23 0748   atorvastatin (LIPITOR) tablet 40 mg, 40 mg, Per Tube, QPM, Florencio Hunting, MD, 40 mg at 11/08/23 1754   bisacodyl (DULCOLAX) suppository 10 mg, 10 mg, Rectal, Daily, Gross, Landon Pinion, MD, 10 mg at 11/09/23 1004   budesonide (PULMICORT)  nebulizer solution 0.5 mg, 0.5 mg, Nebulization, BID, Danice Dural, MD, 0.5 mg at 11/09/23 0749   busPIRone (BUSPAR) tablet 15 mg, 15 mg, Per Tube, BID, Florencio Hunting, MD, 15 mg at 11/09/23 7829   Chlorhexidine Gluconate Cloth 2 % PADS 6 each, 6 each, Topical, Daily, Florencio Hunting, MD, 6 each at 11/08/23 0904   [START ON 11/10/2023] cyanocobalamin (VITAMIN B12) injection 1,000 mcg, 1,000 mcg, Intramuscular, Daily, Derra Shartzer, Althea Atkinson, MD   dexmedetomidine (PRECEDEX) 400 MCG/100ML (4 mcg/mL) infusion, 0-1.5 mcg/kg/hr, Intravenous, Titrated, Ogan, Okoronkwo U, MD, Last Rate: 27.9 mL/hr at 11/09/23 1538, 0.9 mcg/kg/hr at 11/09/23 1538   DULoxetine (CYMBALTA) DR capsule 60 mg, 60 mg, Oral, QHS, Florencio Hunting, MD, 60 mg at 11/08/23 2254   enoxaparin (LOVENOX) injection 40 mg, 40 mg, Subcutaneous, Q24H, Hoffman, Paul W, NP, 40 mg at 11/09/23 1043   famotidine (PEPCID) tablet 20 mg, 20 mg, Per Tube, BID, Hoffman, Paul W, NP, 20 mg at 11/09/23 0956   feeding supplement (PIVOT 1.5 CAL) liquid 1,000 mL, 1,000 mL, Per Tube, Continuous, Candyce Champagne, MD, Last Rate: 70 mL/hr at 11/09/23 1538, Infusion Verify at 11/09/23 1538   feeding supplement (PROSource TF20) liquid 60 mL, 60 mL, Per Tube, Daily, Harris, Whitney D, NP, 60 mL at 11/09/23 0952   fentaNYL (SUBLIMAZE) bolus via infusion 50-100 mcg, 50-100 mcg, Intravenous, Q15 min PRN, Lemmie Pyo, NP, 100  mcg at 11/08/23 1711   fentaNYL in NS (10mcg/ml) infusion-PREMIX, 50-200 mcg/hr, Intravenous, Continuous, Lemmie Pyo, NP, Stopped at 11/09/23 5284   folic acid (FOLVITE) tablet 1 mg, 1 mg, Per Tube, Daily, Florencio Hunting, MD, 1 mg at 11/09/23 0956   gabapentin (NEURONTIN) 250 MG/5ML solution 900 mg, 900 mg, Per Tube, Q12H, Florencio Hunting, MD, 900 mg at 11/09/23 1048   guaiFENesin-dextromethorphan (ROBITUSSIN DM) 100-10 MG/5ML syrup 15 mL, 15 mL, Per Tube, Q4H PRN, Florencio Hunting, MD   hydrALAZINE (APRESOLINE) injection 10 mg,  10 mg, Intravenous, Q6H PRN, Jaquita Merl P, DO   insulin aspart (novoLOG) injection 0-20 Units, 0-20 Units, Subcutaneous, Q4H, Desai, Nikita S, MD, 15 Units at 11/09/23 1548   ketamine (KETALAR) adult IV infusion 1000mg /182mL (10 mg/mL-Premix), 0.5 mg/kg/hr, Intravenous, Continuous, Flora Humphreys L, DO, Last Rate: 6.45 mL/hr at 11/09/23 1538, 0.5 mg/kg/hr at 11/09/23 1538   ketamine (KETALAR) bolus via infusion 45.15 mg, 0.35 mg/kg, Intravenous, Q1H PRN, Golding, Elizabeth L, DO, 45.15 mg at 11/09/23 0436   metoprolol tartrate (LOPRESSOR) tablet 50 mg, 50 mg, Per Tube, BID, Florencio Hunting, MD, 50 mg at 11/09/23 1324   multivitamin liquid 15 mL, 15 mL, Per Tube, Daily, Florencio Hunting, MD, 15 mL at 11/09/23 4010   naphazoline-glycerin (CLEAR EYES REDNESS) ophth solution 1-2 drop, 1-2 drop, Both Eyes, QID PRN, Candyce Champagne, MD   nicotine (NICODERM CQ - dosed in mg/24 hours) patch 21 mg, 21 mg, Transdermal, Daily, Crosley, Debby, MD, 21 mg at 11/09/23 1016   ondansetron (ZOFRAN) injection 4 mg, 4 mg, Intravenous, Q4H PRN, Florencio Hunting, MD   Oral care mouth rinse, 15 mL, Mouth Rinse, Q2H, Florencio Hunting, MD, 15 mL at 11/09/23 1549   Oral care mouth rinse, 15 mL, Mouth Rinse, PRN, Florencio Hunting, MD   PHENObarbital (LUMINAL) injection 130 mg, 130 mg, Intravenous, Q4H PRN, Golding, Elizabeth L, DO   piperacillin-tazobactam (ZOSYN) IVPB 3.375 g, 3.375 g, Intravenous, Q8H, Scheryl Cushing, RPH, Stopped at 11/09/23 1421   polycarbophil (FIBERCON) tablet 625 mg, 625 mg, Per Tube, BID, Florencio Hunting, MD, 625 mg at 11/09/23 0957   polyethylene glycol (MIRALAX / GLYCOLAX) packet 17 g, 17 g, Per Tube, Daily, Desai, Nikita S, MD, 17 g at 11/09/23 0953   potassium chloride (KLOR-CON) packet 40 mEq, 40 mEq, Per Tube, Once, Lemmie Pyo, NP   revefenacin (YUPELRI) nebulizer solution 175 mcg, 175 mcg, Nebulization, Daily, Kc, Ramesh, MD, 175 mcg at 11/09/23 0751   rifaximin (XIFAXAN) tablet 550 mg,  550 mg, Per Tube, BID, Flora Humphreys L, DO, 550 mg at 11/09/23 1043   sodium chloride flush (NS) 0.9 % injection 3 mL, 3 mL, Intravenous, Q12H, Candyce Champagne, MD, 3 mL at 11/09/23 0915   sodium chloride flush (NS) 0.9 % injection 3 mL, 3 mL, Intravenous, PRN, Candyce Champagne, MD   thiamine (VITAMIN B1) 500 mg in sodium chloride 0.9 % 50 mL IVPB, 500 mg, Intravenous, TID, Augustin Leber, MD  Labs and Diagnostic Imaging   Imaging(Personally reviewed): CT head-negative   Assessment   GRAYSON PFEFFERLE is a 53 y.o. male with progressive encephalopathy and agitation presumed from alcohol withdrawal who was intubated for hypoxemia.  His current exam is quite concerning to me, as he is fairly densely comatose and has no doll's eye response or cough reflex.  He has received just over 1200 mg of pentobarbital, and if he is a very slow metabolizer, this could result in a significant level, but  even then I would be surprised about it causing an encephalopathy distance.  With his history of alcohol abuse, I will increase to Wernicke's dosing.  His B12 is significantly low, and can certainly be contributing to any other causes of encephalopathy.  His ammonia is borderline high, but not to the extent that I would typically think of causing this severe encephalopathy.  No evidence of seizures on limited EEG.  Recommendations  Increase thiamine dosing to 500 mg 3 times daily MRI brain and cervical spine I would favor holding phenobarbital given his full of sedation PHB level B12 supplementation Neurology will continue to follow ______________________________________________________________________   Signed, Ann Keto, MD [facility]

## 2023-11-09 NOTE — TOC Progression Note (Signed)
 Transition of Care [facility]) - Progression Note    Patient Details  Name: Preston [name] MRN: [id number] Date of Birth: [birth year]  [facility] CM/SW Contact  Katrine Parody, [place] Phone Number: 11/09/2023, 3:28 PM  Clinical Narrative:    [facility] consult requested for substance use education/resources.  CSW placed Adult resource guide for substance abuse.  TOC to follow.   Expected Discharge Plan: (P) Home/Self Care Barriers to Discharge: Continued Medical Work up  Expected Discharge Plan and Services         Expected Discharge Date: 11/02/23                                     Social Determinants of Health (SDOH) Interventions SDOH Screenings   Food Insecurity: No Food Insecurity (11/01/2023)  Recent Concern: Food Insecurity - Food Insecurity Present (10/31/2023)  Housing: Low Risk  (10/31/2023)  Transportation Needs: No Transportation Needs (10/31/2023)  Utilities: Not At Risk (10/31/2023)  Alcohol Screen: Low Risk  (07/11/2023)  Depression (PHQ2-9): Low Risk  (10/14/2023)  Financial Resource Strain: High Risk (07/11/2023)  Physical Activity: Unknown (07/11/2023)  Social Connections: Unknown (07/11/2023)  Stress: Stress Concern Present (07/11/2023)  Tobacco Use: High Risk (11/04/2023)    Readmission Risk Interventions     No data to display

## 2023-11-09 NOTE — IPAL (Signed)
  Interdisciplinary Goals of Care Family Meeting   Date carried out: 11/09/2023  Location of the meeting: Bedside  Member's involved: Physician, Bedside Registered Nurse, and Family Member or next of kin  Durable Power of Attorney or acting medical decision maker: patient's wife and 2 daughters present    Discussion: We discussed goals of care for [employer] .  Patient's wife is very distressed at his postoperative course.  She says that he has been through a similar situation 4 times in the last 2 years.  Family is adamant that they would not want him to go undergo any undue suffering with chest compressions.  He is DNR.  Daughter says that he would have an expected prolonged rehab stent and he would not be the kind of person that would want to do any kind of rehab.  He would not follow doctors orders and he would not be compliant with medical therapy.  We discussed that usually around the 12-day mark we started discussed tracheostomy for long-term critical illness.  They do not want to do a tracheostomy under any circumstances.  At this point their optimal scenario is to medically optimize him and attempt extubation daily.  If unable to extubate him in the next 3 to 4 days they would want to have family come and see him and proceed with a one-way extubation with transition to comfort measures if he does not thrive.  Code status:   Code Status: Do not attempt resuscitation (DNR) PRE-ARREST INTERVENTIONS DESIRED   Disposition: Continue current acute care  Time spent for the meeting: 35 minutes    Aleck Hurdle, MD  11/09/2023, 12:23 PM

## 2023-11-09 NOTE — Progress Notes (Signed)
 OT Cancellation Note  Patient Details Name: Preston [name] MRN: [id number] DOB: 17-Nov-1970   Cancelled Treatment:    Reason Eval/Treat Not Completed: Medical issues which prohibited therapy Patient remains intubated with sedation. OT to continue to follow and check back on 4/13.  Wynette Heckler, MS Acute Rehabilitation Department Office# [phone]  11/09/2023, 7:38 AM

## 2023-11-09 NOTE — Progress Notes (Signed)
   11/09/23 0813  Vent Select  Invasive or Noninvasive Invasive  Adult Vent Y  Airway 8 mm  Placement Date/Time: 11/05/23 0110   Placed By: ICU physician  Airway Device: Endotracheal Tube  Laryngoscope Blade: 3  ETT Types: Oral  Size (mm): 8 mm  Cuffed: Cuffed  Insertion attempts: 1  Airway Equipment: Stylet;Video Laryngoscope  Placement Con...  Secured at (cm) 27 cm  Measured From Lips  Secured Location Center  Secured By English as a second language teacher No  Tube Holder Repositioned Yes  Prone position No  Head position Left  Cuff Pressure (cm H2O) Green OR 18-26 CmH2O  Site Condition Drainage (Comment)  Adult Ventilator Settings  Vent Type (S)  Servo i  Humidity (S)  HME  Vent Mode (S)  PSV;CPAP  FiO2 (%) (S)  40 %  Pressure Support (S)  10 cmH20  PEEP (S)  5 cmH20  Adult Ventilator Measurements  Peak Airway Pressure 16 L/min  Mean Airway Pressure 8 cmH20  Resp Rate Spontaneous 12 br/min  Resp Rate Total 12 br/min  Spont TV 589 mL  Measured Ve 7.6 L  Total PEEP 5 cmH20  SpO2 96 %  Adult Ventilator Alarms  Alarms On Y  Ve High Alarm 24 L/min  Ve Low Alarm 5 L/min  Resp Rate High Alarm 36 br/min  Resp Rate Low Alarm 8  PEEP Low Alarm 3 cmH2O  Press High Alarm 45 cmH2O  T Apnea 20 sec(s)  VAP Prevention  HOB> 30 Degrees Y  Breath Sounds  Bilateral Breath Sounds Rhonchi;Diminished  Vent Respiratory Assessment  Level of Consciousness Responds to Pain  Respiratory Pattern Regular;Unlabored  Patient Tolerance Tolerated well  Suction Method  Respiratory Interventions Airway suction;Oral suction  Oral Suctioning/Secretions  Suction Type Oral  Suction Device Yankauer  Secretion Amount Copious  Secretion Color White;Yellow  Secretion Consistency Thin  Suction Tolerance Tolerated fairly well  Suctioning Adverse Effects None  Airway Suctioning/Secretions  Suction Type ETT  Suction Device  Catheter  Secretion Amount Moderate  Secretion Color Yellow;White   Secretion Consistency Thin  Suction Tolerance Tolerated fairly well  Suctioning Adverse Effects None   PSV 10/5, 40%.

## 2023-11-10 DIAGNOSIS — R451 Restlessness and agitation: Secondary | ICD-10-CM | POA: Diagnosis not present

## 2023-11-10 DIAGNOSIS — G9340 Encephalopathy, unspecified: Secondary | ICD-10-CM | POA: Diagnosis not present

## 2023-11-10 DIAGNOSIS — Z720 Tobacco use: Secondary | ICD-10-CM | POA: Diagnosis not present

## 2023-11-10 DIAGNOSIS — D49511 Neoplasm of unspecified behavior of right kidney: Secondary | ICD-10-CM | POA: Diagnosis not present

## 2023-11-10 DIAGNOSIS — J9601 Acute respiratory failure with hypoxia: Secondary | ICD-10-CM | POA: Diagnosis not present

## 2023-11-10 DIAGNOSIS — F10239 Alcohol dependence with withdrawal, unspecified: Secondary | ICD-10-CM | POA: Diagnosis not present

## 2023-11-10 DIAGNOSIS — R0902 Hypoxemia: Secondary | ICD-10-CM | POA: Diagnosis not present

## 2023-11-10 DIAGNOSIS — R918 Other nonspecific abnormal finding of lung field: Secondary | ICD-10-CM | POA: Diagnosis not present

## 2023-11-10 DIAGNOSIS — R410 Disorientation, unspecified: Secondary | ICD-10-CM

## 2023-11-10 LAB — CBC
HCT: 36.8 % — ABNORMAL LOW (ref 39.0–52.0)
Hemoglobin: 11.4 g/dL — ABNORMAL LOW (ref 13.0–17.0)
MCH: 32.1 pg (ref 26.0–34.0)
MCHC: 31 g/dL (ref 30.0–36.0)
MCV: 103.7 fL — ABNORMAL HIGH (ref 80.0–100.0)
Platelets: 256 10*3/uL (ref 150–400)
RBC: 3.55 MIL/uL — ABNORMAL LOW (ref 4.22–5.81)
RDW: 13.1 % (ref 11.5–15.5)
WBC: 18 10*3/uL — ABNORMAL HIGH (ref 4.0–10.5)
nRBC: 0 % (ref 0.0–0.2)

## 2023-11-10 LAB — COMPREHENSIVE METABOLIC PANEL WITH GFR
ALT: 37 U/L (ref 0–44)
AST: 39 U/L (ref 15–41)
Albumin: 3.2 g/dL — ABNORMAL LOW (ref 3.5–5.0)
Alkaline Phosphatase: 44 U/L (ref 38–126)
Anion gap: 9 (ref 5–15)
BUN: 88 mg/dL — ABNORMAL HIGH (ref 6–20)
CO2: 28 mmol/L (ref 22–32)
Calcium: 8.5 mg/dL — ABNORMAL LOW (ref 8.9–10.3)
Chloride: 107 mmol/L (ref 98–111)
Creatinine, Ser: 1.57 mg/dL — ABNORMAL HIGH (ref 0.61–1.24)
GFR, Estimated: 53 mL/min — ABNORMAL LOW (ref >60)
Glucose, Bld: 238 mg/dL — ABNORMAL HIGH (ref 70–99)
Potassium: 4.4 mmol/L (ref 3.5–5.1)
Sodium: 144 mmol/L (ref 135–145)
Total Bilirubin: 0.9 mg/dL (ref 0.0–1.2)
Total Protein: 6.3 g/dL — ABNORMAL LOW (ref 6.5–8.1)

## 2023-11-10 LAB — GLUCOSE, CAPILLARY
Glucose-Capillary: 178 mg/dL — ABNORMAL HIGH (ref 70–99)
Glucose-Capillary: 199 mg/dL — ABNORMAL HIGH (ref 70–99)
Glucose-Capillary: 220 mg/dL — ABNORMAL HIGH (ref 70–99)
Glucose-Capillary: 223 mg/dL — ABNORMAL HIGH (ref 70–99)
Glucose-Capillary: 252 mg/dL — ABNORMAL HIGH (ref 70–99)
Glucose-Capillary: 258 mg/dL — ABNORMAL HIGH (ref 70–99)
Glucose-Capillary: 300 mg/dL — ABNORMAL HIGH (ref 70–99)

## 2023-11-10 LAB — CULTURE, BLOOD (ROUTINE X 2)
Culture: NO GROWTH
Culture: NO GROWTH
Special Requests: ADEQUATE

## 2023-11-10 MED ORDER — PROPOFOL 1000 MG/100ML IV EMUL
5.0000 ug/kg/min | INTRAVENOUS | Status: DC
Start: 2023-11-10 — End: 2023-11-15
  Administered 2023-11-10: 5 ug/kg/min via INTRAVENOUS
  Administered 2023-11-11 (×4): 75 ug/kg/min via INTRAVENOUS
  Administered 2023-11-11: 45 ug/kg/min via INTRAVENOUS
  Administered 2023-11-11: 75 ug/kg/min via INTRAVENOUS
  Administered 2023-11-11: 65 ug/kg/min via INTRAVENOUS
  Administered 2023-11-11: 55 ug/kg/min via INTRAVENOUS
  Administered 2023-11-11: 45 ug/kg/min via INTRAVENOUS
  Administered 2023-11-12: 75 ug/kg/min via INTRAVENOUS
  Administered 2023-11-12: 60 ug/kg/min via INTRAVENOUS
  Administered 2023-11-12: 75 ug/kg/min via INTRAVENOUS
  Administered 2023-11-12 (×2): 60 ug/kg/min via INTRAVENOUS
  Administered 2023-11-12: 40 ug/kg/min via INTRAVENOUS
  Administered 2023-11-12: 75 ug/kg/min via INTRAVENOUS
  Administered 2023-11-12: 60 ug/kg/min via INTRAVENOUS
  Administered 2023-11-12: 75 ug/kg/min via INTRAVENOUS
  Administered 2023-11-13: 40 ug/kg/min via INTRAVENOUS
  Administered 2023-11-13: 60 ug/kg/min via INTRAVENOUS
  Administered 2023-11-13: 50 ug/kg/min via INTRAVENOUS
  Administered 2023-11-13: 40 ug/kg/min via INTRAVENOUS
  Administered 2023-11-13: 60 ug/kg/min via INTRAVENOUS
  Administered 2023-11-13: 5 ug/kg/min via INTRAVENOUS
  Administered 2023-11-14: 80 ug/kg/min via INTRAVENOUS
  Administered 2023-11-14: 70 ug/kg/min via INTRAVENOUS
  Administered 2023-11-14: 80 ug/kg/min via INTRAVENOUS
  Administered 2023-11-14: 70 ug/kg/min via INTRAVENOUS
  Administered 2023-11-14: 80 ug/kg/min via INTRAVENOUS
  Administered 2023-11-14 (×2): 60 ug/kg/min via INTRAVENOUS
  Administered 2023-11-14: 70 ug/kg/min via INTRAVENOUS
  Administered 2023-11-14: 80 ug/kg/min via INTRAVENOUS
  Administered 2023-11-14: 60 ug/kg/min via INTRAVENOUS
  Administered 2023-11-14: 80 ug/kg/min via INTRAVENOUS
  Administered 2023-11-14: 60 ug/kg/min via INTRAVENOUS
  Administered 2023-11-14 – 2023-11-15 (×3): 80 ug/kg/min via INTRAVENOUS
  Filled 2023-11-10 (×42): qty 100

## 2023-11-10 MED ORDER — MIDAZOLAM HCL 2 MG/2ML IJ SOLN
2.0000 mg | INTRAMUSCULAR | Status: DC | PRN
  Administered 2023-11-10 (×3): 2 mg via INTRAVENOUS
  Administered 2023-11-11 (×2): 4 mg via INTRAVENOUS
  Administered 2023-11-11: 2 mg via INTRAVENOUS
  Administered 2023-11-11: 4 mg via INTRAVENOUS
  Administered 2023-11-11 – 2023-11-12 (×2): 2 mg via INTRAVENOUS
  Administered 2023-11-13: 4 mg via INTRAVENOUS
  Administered 2023-11-14: 2 mg via INTRAVENOUS
  Filled 2023-11-10 (×4): qty 4
  Filled 2023-11-10: qty 2
  Filled 2023-11-10: qty 4
  Filled 2023-11-10: qty 2
  Filled 2023-11-10: qty 4
  Filled 2023-11-10 (×2): qty 2
  Filled 2023-11-10 (×3): qty 4

## 2023-11-10 NOTE — Progress Notes (Signed)
 Patient was highly agitated and fighting the ventilator at 1720.  The fight with the ventilator was asynchronous and the patient was tense and not calming down even after suctioning several times.  Started with fentanyl bolus of 100 mcg at 1725.  Then gave the new order of 2 mg of versed injection.  These did not have any affect on the patient's agitation.  Gave the next fentanyl bolus of 100 mcg at 1745.  MD said to also go up on the continuous rate at 150 mcg/hr at 1729 (still within the order).  Patient was still fighting and was asynchronous with the ventilator at 1755.  The MD gave a verbal order to give 4 mg of versed one time and and gave a verbal order to go up on the fentanyl continuous rate at 200 mcg/hr before the allotted time interval from the last titration.  The patient was still agitated after this medication and the MD put in an order for propofol gtt.  At initiation of this gtt, the patient became calm and was no longer fighting with the ventilator.    Verle Gleason, RN

## 2023-11-10 NOTE — Progress Notes (Signed)
 NAME:  Preston [name], MRN:  [id number], DOB:  05-01-1971, LOS: 8 ADMISSION DATE:  10/31/2023, CONSULTATION DATE:  11/04/2023 REFERRING MD:  Dr. Lesa Rape - [facility], CHIEF COMPLAINT:  Hypoxia and concern for alcohol    History of Present Illness:  Preston [name] is a 53 y.o. with a past medical history significant for left partial nephrectomy for renal cell carcinoma, abdominal incisional hernia, TN, HLD, CHF, COPD, and GERD who presented for elective abdominal incision hernia repair with combination right robotic assisted laparoscopic partial nephrectomy.  Per chart review it appears patient tolerated procedures well.    Postop day 1 patient was seen with mild hypoxia with SpO2 84% on room air this was felt due to splinting secondary to pain and pulmonary hygiene was encouraged.  Hospitalist service was consulted 4/5 for persistent hypoxemia.   By 4/7 hypoxemia continued to persistent with AMS as well, concerning for ETOH withdrawal, PCCM consulted   Pertinent  Medical History  Left partial nephrectomy for renal cell carcinoma, abdominal incisional hernia, TN, HLD, CHF, COPD, and GERD  Significant Hospital Events: Including procedures, antibiotic start and stop dates in addition to other pertinent events   4/3 admitted for elective hernia repair and partial right nephrectomy  4/4 hypoxic on RA  4/5 [facility] consulted  4/7 Signs of alcohol withdrawal and AMS, intubated overnight for increased work of breathing 4/8 high Fio2/PEEP needs. PNA vs edema. Met ARDS criteria. Zosyn started and diuresed.  4/9: weaned to minimal support, became hypoxic and back to 50% and 10 PEEP 4/11 unresponsive on ventilator this a.m with upward forced gaze., continuous sedation stopped.  Within about an hour patient began spontaneously moving during deep suctioning but upward forced gaze persist.  Head CT and Spot EEG pending  Interim History / Subjective:  More awake this morning. Fentanyl had to be turned back on.  Febrile.   Objective   Blood pressure 115/64, pulse 82, temperature (!) 101.5 F (38.6 C), temperature source Esophageal, resp. rate 18, height 6\' 3"  (1.905 m), weight 121 kg, SpO2 94%.    Vent Mode: PRVC FiO2 (%):  [40 %] 40 % Set Rate:  [22 bmp] 22 bmp Vt Set:  [500 mL] 500 mL PEEP:  [5 cmH20] 5 cmH20 Plateau Pressure:  [14 cmH20-24 cmH20] 24 cmH20   Intake/Output Summary (Last 24 hours) at 11/10/2023 4098 Last data filed at 11/10/2023 0920 Gross per 24 hour  Intake 3433.91 ml  Output 3180 ml  Net 253.91 ml   Filed Weights   11/08/23 0406 11/09/23 0500 11/10/23 0430  Weight: 129 kg 127 kg 121 kg    Examination:  Gen:      Intubated, sedated, acutely ill appearing HEENT:  ETT to vent Lungs:    sounds of mechanical ventilation auscultated, diminished, copious secretions CV:         RRR Abd:      + bowel sounds; soft, non-tender; no palpable masses, no distension Ext:    No edema Skin:      Warm and dry; no rashes Neuro:   sedated, RASS -1, moves all 4 extremities, does not follow commands  Labs show increase Cr     Resolved Hospital Problem list     Assessment & Plan:  Acute likely on chronic hypoxic respiratory failure COPD with exacerbation Diffuse infiltrates on CXR raising concern for aspiration vs edema vs acute lung injury.  Active tobacco use P: Continue ventilator support with lung protective strategies  Wean PEEP and FiO2 for  sats greater than 90%. Head of bed elevated 30 degrees. Plateau pressures less than 30 cm H20.  Follow intermittent chest x-ray and ABG.   SAT/SBT as tolerated, mentation preclude extubation  Ensure adequate pulmonary hygiene  Follow cultures  VAP bundle in place  PAD protocol Continue Brovana, Pulmicort, and Yupelri Completed steroids and zosyn x 5 days for copd exacerbation today. Will stop abx today. He is fevering despite abx - suspect nonifectious fever including etoh withdrawal, drug fever.   Acute encephalopathy ICU  delirium Alcohol withdrawal with delirium P: - neuro following. EEG negative for seizures, MRI pending - Currently on ketamine,fentanyl, precedex, scheduled klonazepam. Phenobarb held for oversedation - titrating down gabapentin, buspar. Stopping zosyn today which can also contribute to encephalopathy.  - continue cymbalta - Supplement thiamine, folate, multivitamin - suspect more ICU delirium now given time frame outside etoh withdrawal window  Right renal mass s/p partial nephrectomy surgical pathology with renal papillary adenoma(benign) - also post op laparascopic ventral hernia repair with mesh placement on 4/3 - CT A/P demonstrates post op hematoma vs seroma in the abdominal wall. Discussed with Dr. Hershell Lose would not intervene on this  AKI -worsening P: Continue to closely follow renal function Monitor urine output - holding diuresis today  Chronic diastolic CHF  -ECHO 4/5 with EF 50-55% with mildly reduced RV function with basal inferior/inferoseptal hypokinesis. HS tops WNL  P: Continuous telemetry Continue statin Strict intake and output Closely monitor renal function and electrolytes Holding diuresis today  Type 2 diabetes with steroid induced hyperglycemia P: Continue SSI, resistant. Monitor today off steroids. CBG goal 140-180 CBG checks every 4 hrs, consider tube feed coverage if elevated.  GERD  P: PPI   Best Practice (right click and "Reselect all SmartList Selections" daily)   Diet/type: NPO tube feeds DVT prophylaxis LMWH Pressure ulcer(s): N/A GI prophylaxis: PPI Lines: N/A Foley:  Yes, and it is still needed Code Status:  full code Last date of multidisciplinary goals of care discussion: wife updated 4/12 at bedside. See ipal note. She was very distressed and felt that he would not want longterm critical illness.    Critical care time:    The patient is critically ill due to encephalopathy, respiratory failure.  Critical care was necessary to  treat or prevent imminent or life-threatening deterioration.  Critical care was time spent personally by me on the following activities: development of treatment plan with patient and/or surrogate as well as nursing, discussions with consultants, evaluation of patient's response to treatment, examination of patient, obtaining history from patient or surrogate, ordering and performing treatments and interventions, ordering and review of laboratory studies, ordering and review of radiographic studies, pulse oximetry, re-evaluation of patient's condition and participation in multidisciplinary rounds.   Critical Care Time devoted to patient care services described in this note is 46 minutes. This time reflects time of care of this signee Preston [name] . This critical care time does not reflect separately billable procedures or procedure time, teaching time or supervisory time of PA/NP/Med student/Med Resident etc but could involve care discussion time.       Aleck Hurdle [facility] 11/10/2023 9:28 AM  Pager: see AMION  If no response to pager , please call critical care on call (see AMION) until 7pm After 7:00 pm call Elink

## 2023-11-10 NOTE — Plan of Care (Signed)
  Problem: Clinical Measurements: Goal: Diagnostic test results will improve Outcome: Not Progressing Goal: Respiratory complications will improve Outcome: Not Progressing Goal: Cardiovascular complication will be avoided Outcome: Not Progressing   Problem: Activity: Goal: Risk for activity intolerance will decrease Outcome: Not Progressing   Problem: Coping: Goal: Level of anxiety will decrease Outcome: Not Progressing   Problem: Elimination: Goal: Will not experience complications related to bowel motility Outcome: Not Progressing   Problem: Pain Managment: Goal: General experience of comfort will improve and/or be controlled Outcome: Not Progressing

## 2023-11-10 NOTE — Progress Notes (Signed)
 NEUROLOGY CONSULT FOLLOW UP NOTE   Date of service: November 10, 2023 Patient Name: Preston [name] MRN:  [id number] DOB:  July 21, 1971  Interval Hx/subjective   He is more awake today, but also much more agitated  Vitals   Vitals:   11/10/23 1700 11/10/23 1725 11/10/23 1730 11/10/23 1800  BP: 103/60  (!) 147/85 (!) 175/93  Pulse: 61  74 82  Resp: (!) 22 18 15 14   Temp:    99.5 F (37.5 C)  TempSrc:    Esophageal  SpO2: 95%  100% 97%  Weight:      Height:         Body mass index is 33.34 kg/m.  Physical Exam   Constitutional: Appears well-developed and well-nourished.  Neurologic Examination    MS: Does not open eyes or follow commands. CN: Pupils are reactive bilaterally, corneals are intact, no response to doll's eye maneuver, eyes are slightly disconjugate Motor: He moves all extremities spontaneously and to noxious stimulation Sensory: As above  Medications  Current Facility-Administered Medications:    0.9 %  sodium chloride infusion, 250 mL, Intravenous, PRN, Candyce Champagne, MD   acetaminophen (TYLENOL) 160 MG/5ML solution 650 mg, 650 mg, Per Tube, QID, Candyce Champagne, MD, 650 mg at 11/10/23 1750   albuterol (PROVENTIL) (2.5 MG/3ML) 0.083% nebulizer solution 2.5 mg, 2.5 mg, Nebulization, Q6H PRN, Florencio Hunting, MD, 2.5 mg at 11/01/23 1012   allopurinol (ZYLOPRIM) tablet 300 mg, 300 mg, Per Tube, Daily, Florencio Hunting, MD, 300 mg at 11/10/23 1008   amLODipine (NORVASC) tablet 10 mg, 10 mg, Per Tube, QHS, Borden, Lester, MD, 10 mg at 11/09/23 2138   arformoterol (BROVANA) nebulizer solution 15 mcg, 15 mcg, Nebulization, BID, Kc, Ramesh, MD, 15 mcg at 11/10/23 0756   atorvastatin (LIPITOR) tablet 40 mg, 40 mg, Per Tube, QPM, Florencio Hunting, MD, 40 mg at 11/10/23 1750   bisacodyl (DULCOLAX) suppository 10 mg, 10 mg, Rectal, Daily, Gross, Landon Pinion, MD, 10 mg at 11/10/23 0953   budesonide (PULMICORT) nebulizer solution 0.5 mg, 0.5 mg, Nebulization, BID, Danice Dural,  MD, 0.5 mg at 11/10/23 1191   Chlorhexidine Gluconate Cloth 2 % PADS 6 each, 6 each, Topical, Daily, Florencio Hunting, MD, 6 each at 11/10/23 0453   cyanocobalamin (VITAMIN B12) injection 1,000 mcg, 1,000 mcg, Intramuscular, Daily, Augustin Leber, MD, 1,000 mcg at 11/10/23 1032   dexmedetomidine (PRECEDEX) 400 MCG/100ML (4 mcg/mL) infusion, 0-1.5 mcg/kg/hr, Intravenous, Titrated, Ogan, Okoronkwo U, MD, Last Rate: 24.8 mL/hr at 11/10/23 1906, 0.8 mcg/kg/hr at 11/10/23 1906   DULoxetine (CYMBALTA) DR capsule 60 mg, 60 mg, Oral, QHS, Florencio Hunting, MD, 60 mg at 11/09/23 2137   enoxaparin (LOVENOX) injection 40 mg, 40 mg, Subcutaneous, Q24H, Hoffman, Paul W, NP, 40 mg at 11/10/23 1037   famotidine (PEPCID) tablet 20 mg, 20 mg, Per Tube, BID, Hoffman, Paul W, NP, 20 mg at 11/10/23 1015   feeding supplement (PIVOT 1.5 CAL) liquid 1,000 mL, 1,000 mL, Per Tube, Continuous, Gross, Landon Pinion, MD, Last Rate: 70 mL/hr at 11/10/23 1801, Infusion Verify at 11/10/23 1801   feeding supplement (PROSource TF20) liquid 60 mL, 60 mL, Per Tube, Daily, Harris, Whitney D, NP, 60 mL at 11/10/23 0953   fentaNYL (SUBLIMAZE) bolus via infusion 50-100 mcg, 50-100 mcg, Intravenous, Q15 min PRN, Hoffman, Paul W, NP, 100 mcg at 11/10/23 2006   fentaNYL in NS (10mcg/ml) infusion-PREMIX, 50-200 mcg/hr, Intravenous, Continuous, Lemmie Pyo, NP, Last Rate: 20 mL/hr at 11/10/23 1801, 200 mcg/hr at 11/10/23  1801   folic acid (FOLVITE) tablet 1 mg, 1 mg, Per Tube, Daily, Florencio Hunting, MD, 1 mg at 11/10/23 1015   guaiFENesin-dextromethorphan (ROBITUSSIN DM) 100-10 MG/5ML syrup 15 mL, 15 mL, Per Tube, Q4H PRN, Florencio Hunting, MD   hydrALAZINE (APRESOLINE) injection 10 mg, 10 mg, Intravenous, Q6H PRN, Jaquita Merl P, DO   insulin aspart (novoLOG) injection 0-20 Units, 0-20 Units, Subcutaneous, Q4H, Desai, Nikita S, MD, 7 Units at 11/10/23 1954   metoprolol tartrate (LOPRESSOR) tablet 50 mg, 50 mg, Per Tube, BID,  Florencio Hunting, MD, 50 mg at 11/10/23 1005   midazolam (VERSED) injection 2-4 mg, 2-4 mg, Intravenous, Q2H PRN, Desai, Nikita S, MD, 2 mg at 11/10/23 1800   multivitamin liquid 15 mL, 15 mL, Per Tube, Daily, Florencio Hunting, MD, 15 mL at 11/10/23 1003   naphazoline-glycerin (CLEAR EYES REDNESS) ophth solution 1-2 drop, 1-2 drop, Both Eyes, QID PRN, Candyce Champagne, MD   nicotine (NICODERM CQ - dosed in mg/24 hours) patch 21 mg, 21 mg, Transdermal, Daily, Crosley, Debby, MD, 21 mg at 11/10/23 0953   ondansetron (ZOFRAN) injection 4 mg, 4 mg, Intravenous, Q4H PRN, Florencio Hunting, MD   Oral care mouth rinse, 15 mL, Mouth Rinse, Q2H, Florencio Hunting, MD, 15 mL at 11/10/23 1955   Oral care mouth rinse, 15 mL, Mouth Rinse, PRN, Florencio Hunting, MD   polycarbophil (FIBERCON) tablet 625 mg, 625 mg, Per Tube, BID, Florencio Hunting, MD, 625 mg at 11/10/23 1026   polyethylene glycol (MIRALAX / GLYCOLAX) packet 17 g, 17 g, Per Tube, Daily, Desai, Nikita S, MD, 17 g at 11/10/23 0953   potassium chloride (KLOR-CON) packet 40 mEq, 40 mEq, Per Tube, Once, Lemmie Pyo, NP   propofol (DIPRIVAN) 1000 MG/100ML infusion, 5-80 mcg/kg/min, Intravenous, Titrated, Aleck Hurdle, MD, Last Rate: 3.63 mL/hr at 11/10/23 1810, 5 mcg/kg/min at 11/10/23 1810   revefenacin (YUPELRI) nebulizer solution 175 mcg, 175 mcg, Nebulization, Daily, Kc, Ramesh, MD, 175 mcg at 11/10/23 0802   rifaximin (XIFAXAN) tablet 550 mg, 550 mg, Per Tube, BID, Flora Humphreys L, DO, 550 mg at 11/10/23 1026   sodium chloride flush (NS) 0.9 % injection 3 mL, 3 mL, Intravenous, Q12H, Candyce Champagne, MD, 3 mL at 11/10/23 1034   sodium chloride flush (NS) 0.9 % injection 3 mL, 3 mL, Intravenous, PRN, Candyce Champagne, MD   thiamine (VITAMIN B1) 500 mg in sodium chloride 0.9 % 50 mL IVPB, 500 mg, Intravenous, TID, Augustin Leber, MD, Stopped at 11/10/23 1420  Labs and Diagnostic Imaging   Imaging(Personally reviewed): CT head-negative   Assessment    Preston [name] is a 53 y.o. male with progressive encephalopathy and agitation presumed from alcohol withdrawal who was intubated for hypoxemia.  With his history of alcohol abuse, I increased thiamine to Wernicke's dosing.  His B12 is significantly low, and can certainly be contributing to any other causes of encephalopathy.  His ammonia is borderline high, but not to the extent that I would typically think of causing this severe encephalopathy.  He did have a prolonged ceribell without evidence of seizures, and suspicion is relatively low.  With his improvement in movements of his arms and legs, I do not think we need a cervical spine MRI, but with his abnormal eye movements I still think an MRI of his brain would be helpful.  He continues to be possible that this is all related to alcohol withdrawal followed by delirium and over sedation.  I would limit psychoactive medications given  how difficulty has been to titrate in.   Recommendations  Increase thiamine dosing to 500 mg 3 times daily MRI brain  Consider midazolam for sedation B12 supplementation Neurology will follow up MRI ______________________________________________________________________   Signed, Ann Keto, MD [facility]

## 2023-11-10 NOTE — Progress Notes (Addendum)
 11/10/2023  [name] [id number] [birth year]  CARE TEAM: PCP: Preston Dean, NP  Outpatient Care Team: Patient Care Team: Preston Dean, NP as PCP - General (Nurse Practitioner) Hugh Madura, MD as PCP - Cardiology (Cardiology) Evangeline Hilts, MD as Consulting Physician (Gastroenterology) Candyce Champagne, MD as Consulting Physician (General Surgery) Florencio Hunting, MD as Consulting Physician (Urology)  Inpatient Treatment Team: Treatment Team:  Florencio Hunting, MD Candyce Champagne, MD Carrolyn Clan, [place], MD Lesa Rape, MD Pccm, Md, MD Algis Ingles, MD Digna Fraise, [place] Willena Harp, RN Jame Maze, OT Delpha Fickle, [facility] [place], RN   Problem List:   Principal Problem:   Neoplasm of right kidney Active Problems:   Tobacco abuse   HLD (hyperlipidemia)   Alcohol abuse   Chronic diastolic congestive heart failure (HCC)   Gastroesophageal reflux disease without esophagitis   Type 2 diabetes mellitus treated without insulin (HCC)   Essential hypertension   Acute kidney injury (HCC)   Obesity, Class II, BMI 35-39.9   Diastasis recti   Incarcerated incisional hernia s/p repair w mesh 10/31/2023   History of Left Renal cell cancer   Hyponatremia   Acute hypoxemic respiratory failure (HCC)   Alcohol withdrawal delirium (HCC)   ABLA (acute blood loss anemia)   Primary hypertension   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  Swiss cheese hernias going down infraumbilically connecting with his prior  low midline incision as well.  Umbilical hernia.   Type of ventral wall repair:  Laparoscopic underlay repair .with Primary repair of largest hernia Placement of mesh: Centrally intraperitoneal with edges tucked into RECTRORECTUS & preperitoneal space Name of mesh: Bard Ventralight dual sided (polypropylene / Seprafilm) Size of mesh: 33x27cm Orientation: Vertical Mesh overlap:  5-7cm  10/31/2023   Postoperative diagnosis: Right renal neoplasm   Procedure:   Right robotic-assisted laparoscopic partial nephrectomy Intraoperative renal ultrasonography   Surgeon: Preston [name]. M.D.   Intraoperative findings:       1. Warm renal ischemia time: 22 minutes       2. Intraoperative renal ultrasound findings: There was a small cystic appearing mass that measured about 1 cm adjacent to a larger renal cyst.   Drains: # 15 Blake perinephric drain  FINAL MICROSCOPIC DIAGNOSIS:  A. 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 PERINEPHRIC FAT, EXCISION: Benign fibroadipose tissue  C. HERNIA SAC: Benign fibromuscular and adipose tissue     Assessment [facility] Stay = 8 days) 10 Days Post-Op    Worsening delirium and confusion and hypoxia most likely due to alcohol withdrawal and fluid overload.  Encephalopathy more prevalent  Plan:  Main issue seems to be confusion and agitation and oversedation.  Numerous regimens have been adjusted.  Will defer to critical care and neurology.  Sounds like they are trying to wean off phenobarbital and adjust regimen to carefully sedate him on the vent and allow his alcohol and other substance withdrawals  to proceed safely.  Challenging to thread the needle  Fever of uncertain etiology but suction on ET tube seems thick and that raises strong suspicion pneumonia.  InitialTASP not severely concerning.  Continue IV antibiotics.  Postoperative  abdominal wall hematoma/seroma not surprising but no evidence of abscess.  I would hold off on any intervention at this time.  No worsening cellulitis nor purulent drainage.  Most I do is percutaneous drainage  Follow WBC curve in the setting of bolus steroids.  Hopefully should start to taper down.  He is not hemodynamic declining and abdomen is stable to improving.  If markedly worsens, can do bedside aspiration.  Ventilatory support.  Elevated BNP with question of infiltrates versus fluid raises concern for at least some respiratory failure with pulmonary edema and possible emphysema/COPD exacerbation.  Responsive to diuresis. Vent now on minimal settings.  Consider more Lasix.  Ordered one-time dose4/12.  With inc Cr, hold off this morning - defer to CCM/Urology  Defer to extubation to pulmonary critical care.  Vent settings minimal but with some moderate secretions.    Fiber bowel regimen.  Bowel function returning.    TF feeds to goal.    Fiber bowel regimen with rectal suppositories.  Having bowel movements and abdomen soft encouraging.  Agree with diuresis and keep on the dry side.  Ordered Lasix more time.  Pathology on renal mass benign renal papillary adenoma.  Reassuring.  Pain control per critical care.  Challenging in this obese male with chronic pain issues.  Multimodal home regimen Cymbalta, BuSpar, and gabapentin.    Foley catheter for now.  Hopefully can remove once extubated.    monitor electrolytes & replace as needed  Keep K>4, Mg>2, Phos>3  -VTE prophylaxis- SCDs.  Anticoagulation prophyllaxis SQ as appropriate.  Okay to fully anticoagulate if there are concerns  I updated the patient's status to the patient's nurse  Recommendations were made.  Questions were answered.  She expressed understanding & appreciation.  -Disposition: TBD I think the patient is slowly stabilizing.  Vent settings minimal.  Renal function adequate esp s/p partial nephrectomies.  Bowel  function has returned.  The biggest challenge is adequate sedation on the vent but not overdoing it.  Appreciate critical care, neurology and multispecialty care.  Hopefully will gradually improve.  I do not see a point of futility despite slow recovery.  Defer to critical care service.   I reviewed nursing notes, last 24 h vitals and pain scores, last 48 h intake and output, last 24 h labs and trends, and last 24 h imaging results.  I have reviewed this patient's available data, including medical history, events of note, test results, etc as part of my evaluation.   A significant portion of that time was spent in counseling. Care during the described time interval was provided by me.  This care required moderate level of medical decision making.  11/10/2023    Subjective: (Chief complaint)  Remains on vent.  Minimal settings.  Got some diuresis.  Tube feeds at goal.  Less responsive off all sedation.  Critical care & neurology troubleshooting  Objective:  Vital signs:  Vitals:   11/10/23 0600 11/10/23 0630 11/10/23 0640 11/10/23 0700  BP: 98/63 104/63  135/74  Pulse: 72 76  89  Resp: 20 20  (!) 22  Temp:   (!) 101.9 F (38.8 C)   TempSrc:   Axillary   SpO2: 95% 96%  95%  Weight:      Height:  Last BM Date : 11/07/23  Intake/Output   Yesterday:  04/12 0701 - 04/13 0700 In: 3248.1 [I.V.:1169.6; ZO/XW:9604.5; IV Piggyback:203.8] Out: 3230 [Urine:3230] This shift:  Total I/O In: 53.3 [I.V.:23; NG/GT:30.3] Out: -   Bowel function:  Flatus: YES  BM:  YES  Drain: (No drain)   Physical Exam: Clinical ICU nurse team & Dr Rozanne Corners in room with me  General: Pt resting intubated and sedated.  no acute distress.  Calm. Eyes: PERRL  Sclera clear.  No icterus Neuro: No focal sensory/motor deficits. Lymph: No head/neck/groin lymphadenopathy Psych: Sedated at this point.  No more agitation/delirium  HENT: Normocephalic, Mucus membranes moist.  No thrush.   Orogastric tube just placed. Neck: Supple, No tracheal deviation.  No obvious thyromegaly Chest: No pain to chest wall compression.  Good respiratory excursion.  No audible wheezing but using abdominal wall to breathe.  Minimal conversational dyspnea. CV:  Pulses intact.  Regular rhythm.  No major extremity edema MS: Normal AROM mjr joints.  No obvious deformity  Abdomen: Obese Soft.  Nondistended.  Nontender.  Ecchymosis abdominal nearly resolved.  Incisions clean dry intact.  No cellulitis or drainage.  No fluctuance.  No guarding or peritonitis.  Argues against infection.   Ext:   No deformity.  No mjr edema.  No cyanosis Skin: No petechiae / purpurea.  No major sores.  Warm and dry    Results:    SURGICAL PATHOLOGY CASE: [id number] PATIENT: Preston [name] Surgical Pathology Report     Clinical History: Right renal mass, ventral incisional incarcerated hernia (jlr)     FINAL MICROSCOPIC DIAGNOSIS:  A. 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 PERINEPHRIC FAT, EXCISION: Benign fibroadipose tissue  C. HERNIA SAC: Benign fibromuscular and adipose tissue     Rabecka Brendel DESCRIPTION:  A: Specimen is received fresh and consists of a 4.7 x 2.8 x 1.7 cm piece of tan soft tissue, with a small amount of overlying tan-yellow adipose tissue.  Resection margin is inked black, specimen is serially sectioned reveal tan parenchyma.  There is a 1.0 x 1.0 x 0.9 cm tan-white, solid, well-circumscribed lesion identified.  Lesion abuts the outer capsule, and measures 0.2 cm to the inked resection margin.  A 1.1 x 1.0 x 0.9 cm cortical cyst is identified.  No other lesions are identified. Representative sections are submitted in 5 cassettes. 1-3 = tan-white lesion 4 and 5 = cortical cyst  B: Specimen is received fresh, and consists of a 4.1 x 2.8 x 1.3 cm portion of tan-yellow adipose  tissue.  Sectioning reveals grossly unremarkable cut surfaces.  Representative sections are submitted in 1 cassette.  C: Specimen is received fresh and consists of an 8.0 x 7.1 x 2.3 cm aggregate of tan-pink fibromembranous tissue and attached adipose tissue.  Sectioning reveals focally fibrotic cut surfaces. Representative sections are submitted in 3 cassettes.  Jeffrey Mini 11/01/2023)   Final Diagnosis performed by Zhaoli Lane, MD.   Electronically signed 11/04/2023 Technical and / or Professional components performed at [facility], [address] [place], [place] [address].  Immunohistochemistry Technical component (if applicable) was performed at [facility]. [address], [place], [place] [address].   IMMUNOHISTOCHEMISTRY DISCLAIMER (if applicable): Some of these immunohistochemical stains may have been developed and the performance characteristics determine by [facility]. Some may not have been cleared or approved by the U.S. Food and  Drug Administration. The FDA has determined that such clearance or approval is not necessary. This test is used for clinical purposes. It should not be regarded as investigational or for research. This laboratory is certified under the Clinical Laboratory Improvement Amendments of 1988 (CLIA-88) as qualified to perform high complexity clinical laboratory testing.  The controls stained appropriately.   IHC stains are performed on formalin fixed, paraffin embedded tissue using a 3,3"diaminobenzidine (DAB) chromogen and Leica Bond Autostainer System. The staining intensity of the nucleus is score manually and is reported as the percentage of tumor cell nuclei demonstrating specific nuclear staining. The specimens are fixed in 10% Neutral Formalin for at least 6 hours and up to 72hrs. These tests are validated on decalcified tissue. Results should be interpreted with caution given the possibility of  false negative results on decalcified specimens. Antibody Clones are as follows ER-clone 101F, PR-clone 16, Ki67- clone MM1. Some of these immunohistochemical stains may have been developed and the performance characteristics determined by [facility].    Cultures: Recent Results (from the past 720 hours)  MRSA Next Gen by PCR, Nasal     Status: None   Collection Time: 11/04/23  6:08 PM   Specimen: Nasal Mucosa; Nasal Swab  Result Value Ref Range Status   MRSA by PCR Next Gen NOT DETECTED NOT DETECTED Final    Comment: (NOTE) The GeneXpert MRSA Assay (FDA approved for NASAL specimens only), is one component of a comprehensive MRSA colonization surveillance program. It is not intended to diagnose MRSA infection nor to guide or monitor treatment for MRSA infections. Test performance is not FDA approved in patients less than 109 years old. Performed at [facility], [address], [place], [place] [address]   Culture, blood (Routine X 2) w Reflex to ID Panel     Status: None (Preliminary result)   Collection Time: 11/05/23  8:22 AM   Specimen: BLOOD  Result Value Ref Range Status   Specimen Description   Final    BLOOD BLOOD RIGHT ARM AEROBIC BOTTLE ONLY ANAEROBIC BOTTLE ONLY Performed at [facility], [address], [place], [place] [address]    Special Requests   Final    BOTTLES DRAWN AEROBIC AND ANAEROBIC Blood Culture results may not be optimal due to an inadequate volume of blood received in culture bottles Performed at [facility], [address], [place], [place] [address]    Culture   Final    NO GROWTH 4 DAYS Performed at [facility], [address] [place], [place] [address]    Report Status PENDING  Incomplete  Culture, blood (Routine X 2) w Reflex to ID Panel     Status: None (Preliminary result)   Collection Time: 11/05/23  8:22 AM   Specimen: BLOOD  Result Value Ref Range Status    Specimen Description   Final    BLOOD BLOOD RIGHT HAND AEROBIC BOTTLE ONLY ANAEROBIC BOTTLE ONLY Performed at [facility], [address], [place], [place] [address]    Special Requests   Final    BOTTLES DRAWN AEROBIC AND ANAEROBIC Blood Culture adequate volume Performed at [facility], [address] [place], [place] [address]    Culture   Final    NO GROWTH 4 DAYS Performed at [facility], [address] [place], [place] [address]    Report Status PENDING  Incomplete  Culture, Respiratory w Gram Stain  Status: None   Collection Time: 11/05/23  9:56 AM   Specimen: SPU; Respiratory  Result Value Ref Range Status   Specimen Description   Final    SPUTUM Performed at [facility], [address] [place], [place] [address]    Special Requests   Final    SPUTUM Performed at [facility], [address] [place], [place] [address]    Gram Stain   Final    NO WBC SEEN FEW GRAM POSITIVE COCCI IN PAIRS IN CHAINS    Culture   Final    RARE Normal respiratory flora-no Staph aureus or Pseudomonas seen Performed at [facility], [address] [place], [place] [address]    Report Status 11/07/2023 FINAL  Final    Labs: Results for orders placed or performed during the hospital encounter of 10/31/23 (from the past 48 hours)  Glucose, capillary     Status: Abnormal   Collection Time: 11/08/23  7:58 AM  Result Value Ref Range   Glucose-Capillary 162 (H) 70 - 99 mg/dL    Comment: Glucose reference range applies only to samples taken after fasting for at least 8 hours.   Comment 1 Notify RN    Comment 2 Document in Chart   Blood gas, arterial     Status: Abnormal   Collection Time: 11/08/23  8:23 AM  Result Value Ref Range   FIO2 40 %   O2 Content 40.0 L/min   Delivery systems VENTILATOR    Mode PRESSURE REGULATED VOLUME CONTROL    MECHVT 500 mL   RATE 22 resp/min   PEEP 8 cm H20    pH, Arterial 7.36 7.35 - 7.45   pCO2 arterial 63 (H) 32 - 48 mmHg   pO2, Arterial 81 (L) 83 - 108 mmHg   Bicarbonate 35.1 (H) 20.0 - 28.0 mmol/L   Acid-Base Excess 8.1 (H) 0.0 - 2.0 mmol/L   O2 Saturation 96 %   Patient temperature 38.7    Collection site LEFT BRACHIAL    Drawn by [id number]    Allens test (pass/fail) PASS PASS    Comment: Performed at [facility], [address], [place], [place] [address]  Glucose, capillary     Status: Abnormal   Collection Time: 11/08/23 11:40 AM  Result Value Ref Range   Glucose-Capillary 236 (H) 70 - 99 mg/dL    Comment: Glucose reference range applies only to samples taken after fasting for at least 8 hours.   Comment 1 Notify RN    Comment 2 Document in Chart   Glucose, capillary     Status: Abnormal   Collection Time: 11/08/23  4:31 PM  Result Value Ref Range   Glucose-Capillary 268 (H) 70 - 99 mg/dL    Comment: Glucose reference range applies only to samples taken after fasting for at least 8 hours.   Comment 1 Notify RN    Comment 2 Document in Chart   Protime-INR     Status: None   Collection Time: 11/08/23  5:55 PM  Result Value Ref Range   Prothrombin Time 14.2 11.4 - 15.2 seconds   INR 1.1 0.8 - 1.2    Comment: (NOTE) INR goal varies based on device and disease states. Performed at [facility], [address] [place], [place] [address]   Vitamin B12     Status: Abnormal   Collection Time: 11/08/23  5:55 PM  Result Value Ref Range   Vitamin B-12  166 (L) 180 - 914 pg/mL    Comment: (NOTE) This assay is not validated for testing neonatal or myeloproliferative syndrome specimens for Vitamin B12 levels. Performed at [facility], [address], [place], [place] [address]   TSH     Status: None   Collection Time: 11/08/23  5:55 PM  Result Value Ref Range   TSH 1.601 0.350 - 4.500 uIU/mL    Comment: Performed by a 3rd Generation assay with a functional sensitivity of  <=0.01 uIU/mL. Performed at [facility], [address], [place], [place] [address]   Ammonia     Status: Abnormal   Collection Time: 11/08/23  5:55 PM  Result Value Ref Range   Ammonia 47 (H) 9 - 35 umol/L    Comment: Performed at [facility], [address] [place], [place] [address]  Glucose, capillary     Status: Abnormal   Collection Time: 11/08/23  8:04 PM  Result Value Ref Range   Glucose-Capillary 181 (H) 70 - 99 mg/dL    Comment: Glucose reference range applies only to samples taken after fasting for at least 8 hours.  Glucose, capillary     Status: Abnormal   Collection Time: 11/09/23 12:09 AM  Result Value Ref Range   Glucose-Capillary 219 (H) 70 - 99 mg/dL    Comment: Glucose reference range applies only to samples taken after fasting for at least 8 hours.  Glucose, capillary     Status: Abnormal   Collection Time: 11/09/23  3:59 AM  Result Value Ref Range   Glucose-Capillary 256 (H) 70 - 99 mg/dL    Comment: Glucose reference range applies only to samples taken after fasting for at least 8 hours.  Triglycerides     Status: Abnormal   Collection Time: 11/09/23  7:13 AM  Result Value Ref Range   Triglycerides 444 (H) <150 mg/dL    Comment: Performed at [facility], [address] [place], [place] [address]  Glucose, capillary     Status: Abnormal   Collection Time: 11/09/23  7:25 AM  Result Value Ref Range   Glucose-Capillary 252 (H) 70 - 99 mg/dL    Comment: Glucose reference range applies only to samples taken after fasting for at least 8 hours.   Comment 1 Notify RN    Comment 2 Document in Chart   Glucose, capillary     Status: Abnormal   Collection Time: 11/09/23 11:37 AM  Result Value Ref Range   Glucose-Capillary 319 (H) 70 - 99 mg/dL    Comment: Glucose reference range applies only to samples taken after fasting for at least 8 hours.   Comment 1 Notify RN    Comment 2 Document in Chart    Glucose, capillary     Status: Abnormal   Collection Time: 11/09/23  3:44 PM  Result Value Ref Range   Glucose-Capillary 305 (H) 70 - 99 mg/dL    Comment: Glucose reference range applies only to samples taken after fasting for at least 8 hours.   Comment 1 Notify RN    Comment 2 Document in Chart   Phenobarbital level     Status: None   Collection Time: 11/09/23  4:18 PM  Result Value Ref Range   Phenobarbital 17.1 15.0 - 40.0 ug/mL    Comment: Performed at [facility], [address], [place], [place] [address]  Glucose, capillary     Status: Abnormal   Collection Time: 11/09/23  7:29 PM  Result Value Ref Range   Glucose-Capillary 327 (H) 70 - 99 mg/dL    Comment: Glucose reference range applies only to samples taken after fasting for at least 8 hours.   Comment 1 Notify RN    Comment 2 Document in Chart   Glucose, capillary     Status: Abnormal   Collection Time: 11/09/23 11:50 PM  Result Value Ref Range   Glucose-Capillary 229 (H) 70 - 99 mg/dL    Comment: Glucose reference range applies only to samples taken after fasting for at least 8 hours.  CBC     Status: Abnormal   Collection Time: 11/10/23  2:51 AM  Result Value Ref Range   WBC 18.0 (H) 4.0 - 10.5 K/uL   RBC 3.55 (L) 4.22 - 5.81 MIL/uL   Hemoglobin 11.4 (L) 13.0 - 17.0 g/dL   HCT 91.4 (L) 78.2 - 95.6 %   MCV 103.7 (H) 80.0 - 100.0 fL   MCH 32.1 26.0 - 34.0 pg   MCHC 31.0 30.0 - 36.0 g/dL   RDW 21.3 08.6 - 57.8 %   Platelets 256 150 - 400 K/uL   nRBC 0.0 0.0 - 0.2 %    Comment: Performed at [facility], [address], [place], [place] [address]  Comprehensive metabolic panel     Status: Abnormal   Collection Time: 11/10/23  2:51 AM  Result Value Ref Range   Sodium 144 135 - 145 mmol/L   Potassium 4.4 3.5 - 5.1 mmol/L    Comment: HEMOLYSIS AT THIS LEVEL MAY AFFECT RESULT   Chloride 107 98 - 111 mmol/L   CO2 28 22 - 32 mmol/L   Glucose, Bld 238 (H) 70 - 99 mg/dL    Comment: Glucose  reference range applies only to samples taken after fasting for at least 8 hours.   BUN 88 (H) 6 - 20 mg/dL   Creatinine, Ser 9.52 (H) 0.61 - 1.24 mg/dL   Calcium 8.5 (L) 8.9 - 10.3 mg/dL   Total Protein 6.3 (L) 6.5 - 8.1 g/dL   Albumin 3.2 (L) 3.5 - 5.0 g/dL   AST 39 15 - 41 U/L    Comment: HEMOLYSIS AT THIS LEVEL MAY AFFECT RESULT   ALT 37 0 - 44 U/L    Comment: HEMOLYSIS AT THIS LEVEL MAY AFFECT RESULT   Alkaline Phosphatase 44 38 - 126 U/L   Total Bilirubin 0.9 0.0 - 1.2 mg/dL    Comment: HEMOLYSIS AT THIS LEVEL MAY AFFECT RESULT   GFR, Estimated 53 (L) >60 mL/min    Comment: (NOTE) Calculated using the CKD-EPI Creatinine Equation (2021)    Anion gap 9 5 - 15    Comment: Performed at [facility], [address] [place], [place] [address]  Glucose, capillary     Status: Abnormal   Collection Time: 11/10/23  4:14 AM  Result Value Ref Range   Glucose-Capillary 178 (H) 70 - 99 mg/dL    Comment: Glucose reference range applies only to samples taken after fasting for at least 8 hours.    Imaging / Studies: Rapid EEG Result Date: 11/09/2023 Arleene Lack, MD     11/09/2023  1:21 PM  Patient Name: Preston [name] MRN: [id number] Epilepsy Attending: Arleene Lack Referring Physician/Provider: Deneise Finlay D, NP Duration: 11/08/2023 1136 to 11/09/2023 1102 Patient history: 52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 Rapid EEG to evaluate for seizure Level of alertness: comatose/ lethargic AEDs during EEG study:  Phenobarb, GBP Technical aspects: This EEG was obtained using a 10 lead EEG system positioned circumferentially without any parasagittal coverage (rapid EEG). Computer selected EEG is reviewed as  well as background features and all clinically significant events. Description: EEG showed continuous generalized 3 to 6 Hz theta-delta slowing. Hyperventilation and photic stimulation were not performed.   EEG was disconnected between 4/11/025 1707 to 1936 for imaging  ABNORMALITY - Continuous slow, generalized IMPRESSION: This limited ceribell EEG is suggestive of moderate to severe diffuse encephalopathy. No seizures or epileptiform discharges were seen throughout the recording. If suspicion for interictal activity remains a concern, a conventional video EEG can be considered. [name]   CT ABDOMEN PELVIS WO CONTRAST Result Date: 11/08/2023 CLINICAL DATA:  Retroperitoneal hematoma, follow up Persistent fever/Large hematoma EXAM: CT ABDOMEN AND PELVIS WITHOUT CONTRAST TECHNIQUE: Multidetector CT imaging of the abdomen and pelvis was performed following the standard protocol without IV contrast. RADIATION DOSE REDUCTION: This exam was performed according to the departmental dose-optimization program which includes automated exposure control, adjustment of the mA and/or kV according to patient size and/or use of iterative reconstruction technique. COMPARISON:  CT abdomen pelvis 08/06/2023 FINDINGS: Lower chest: Bibasilar dependent airspace opacities, right greater than left. Hepatobiliary: Liver enlarged measuring up to 23 cm. No focal liver abnormality. No gallstones, gallbladder wall thickening, or pericholecystic fluid. No biliary dilatation. Pancreas: No focal lesion. Normal pancreatic contour. No surrounding inflammatory changes. No main pancreatic ductal dilatation. Spleen: Normal in size without focal abnormality. Adrenals/Urinary Tract: No adrenal nodule bilaterally. Nonspecific bilateral perinephric fat stranding. No nephrolithiasis and no hydronephrosis. No ureterolithiasis or hydroureter. The urinary bladder is fully decompressed with Foley catheter in good position. Limited evaluation of the urinary bladder due to streak artifact from bilateral femoral surgical hardware. Stomach/Bowel: Enteric tube tip and side port within the gastric lumen. Sigmoid colon resection noted. Stomach is within normal limits. No evidence of bowel wall thickening or dilatation.  Status post appendectomy. Vascular/Lymphatic: Flattened inferior vena cava. No abdominal aorta or iliac aneurysm. Severe atherosclerotic plaque of the aorta and its branches. No abdominal, pelvic, or inguinal lymphadenopathy. Reproductive: Poorly visualized due to streak artifact originating from the bilateral femoral surgical hardware. Other: Trace volume right retroperitoneum high density free fluid suggestive of blood products (2:61). No intraperitoneal free gas. No organized fluid collection. Musculoskeletal: Supraumbilical ventral wall soft tissue free fluid and emphysema with fluid collection measuring approximately 13 x 3.5 cm. This is in the region of prior ventral wall hernia status post repair. No suspicious lytic or blastic osseous lesions. No acute displaced fracture. Multilevel degenerative changes of the spine. Bilateral total hip arthroplasties partially visualized. IMPRESSION: 1. Bibasilar dependent airspace opacities, right greater than left. Findings could represent a combination of atelectasis and/or infection/inflammation. Limited evaluation on this noncontrast study. 2. Trace volume right retroperitoneal blood products. 3. Supraumbilical ventral wall soft tissue free fluid and emphysema with associated fluid collection measuring approximately 13 x 3.5 cm in the region of prior ventral wall hernia status post repair. No definite recurrent hernia or enterocutaneous fistulization. Finding could represents abscess formation status post surgery with limited evaluation on this noncontrast study. Finding could also represent postsurgical changes-correlate with timing of surgery. Electronically Signed   By: Morgane  Naveau M.D.   On: 11/08/2023 18:51   CT HEAD WO CONTRAST ( ) Result Date: 11/08/2023 CLINICAL DATA:  Provided history: Headache, neuro deficit. EXAM: CT HEAD WITHOUT CONTRAST TECHNIQUE: Contiguous axial images were obtained from the base of the skull through the vertex  without  intravenous contrast. RADIATION DOSE REDUCTION: This exam was performed according to the departmental dose-optimization program which includes automated exposure control, adjustment of the mA and/or kV according to patient size and/or use of iterative reconstruction technique. COMPARISON:  Head CT 01/05/2011. FINDINGS: Brain: Generalized parenchymal atrophy. Mild patchy and ill-defined hypoattenuation within the cerebral white matter, nonspecific but most often secondary to chronic small vessel ischemia. There is no acute intracranial hemorrhage. No demarcated cortical infarct. No extra-axial fluid collection. No evidence of an intracranial mass. No midline shift. Vascular: No hyperdense vessel.  Atherosclerotic calcifications. Skull: No calvarial fracture or aggressive osseous lesion. Sinuses/Orbits: No mass or acute finding within the imaged orbits. Mild mucosal thickening within the bilateral maxillary sinuses at the imaged levels. Other: 15 mm cystic-appearing cutaneous/subcutaneous right facial lesion (overlying the right zygomatic arch), possibly an epidermal inclusion cyst. IMPRESSION: 1. No acute intracranial hemorrhage or acute demarcated cortical infarct. 2. Mild cerebral white matter disease, nonspecific but most often secondary to chronic small vessel ischemia. 3. Generalized parenchymal atrophy. 4. Mild bilateral maxillary sinus mucosal thickening. Electronically Signed   By: Bascom Lily D.O.   On: 11/08/2023 11:04    Medications / Allergies: per chart  Antibiotics: Anti-infectives (From admission, onward)    Start     Dose/Rate Route Frequency Ordered Stop   11/09/23 1100  rifaximin (XIFAXAN) tablet 550 mg        550 mg Per Tube 2 times daily 11/09/23 1012     11/08/23 1815  piperacillin-tazobactam (ZOSYN) IVPB 3.375 g        3.375 g 12.5 mL/hr over 240 Minutes Intravenous Every 8 hours 11/08/23 1722     11/08/23 1245  Ampicillin-Sulbactam (UNASYN) 3 g in sodium chloride 0.9 % 100 mL  IVPB  Status:  Discontinued        3 g 200 mL/hr over 30 Minutes Intravenous Every 6 hours 11/08/23 1157 11/08/23 1717   11/05/23 0800  piperacillin-tazobactam (ZOSYN) IVPB 3.375 g  Status:  Discontinued        3.375 g 12.5 mL/hr over 240 Minutes Intravenous Every 8 hours 11/05/23 0747 11/08/23 1151   10/31/23 2000  ceFAZolin (ANCEF) IVPB 1 g/50 mL premix        1 g 100 mL/hr over 30 Minutes Intravenous Every 8 hours 10/31/23 1606 11/01/23 0449   10/31/23 0600  ceFAZolin (ANCEF) IVPB 2g/100 mL premix  Status:  Discontinued        2 g 200 mL/hr over 30 Minutes Intravenous On call to O.R. 10/31/23 0536 10/31/23 0538   10/31/23 0536  ceFAZolin (ANCEF) IVPB 2g/100 mL premix        2 g 200 mL/hr over 30 Minutes Intravenous 30 min pre-op 10/31/23 0536 10/31/23 1127         Note: Portions of this report may have been transcribed using voice recognition software. Every effort was made to ensure accuracy; however, inadvertent computerized transcription errors may be present.   Any transcriptional errors that result from this process are unintentional.    Preston Goodie, MD, FACS, MASCRS Esophageal, Gastrointestinal & Colorectal Surgery Robotic and Minimally Invasive Surgery  [facility] [address] [place], [place] [address] [phone] Fax [phone] Main  CONTACT INFORMATION: Weekday (9AM-5PM): Call [facility] main office at [phone] Weeknight (5PM-9AM) or Weekend/Holiday: Check EPIC "Web Links" tab & use "AMION" (password " [email or web address]") for General Surgery [facility] coverage  Please, DO NOT use SecureChat  (it  is not reliable communication to reach operating surgeons & will lead to a delay in care).   Epic staff messaging available for outptient concerns needing 1-2 business day response.      11/10/2023  7:22 AM

## 2023-11-10 NOTE — Progress Notes (Signed)
 OT Cancellation Note  Patient Details Name: Preston [name] MRN: [id number] DOB: [birth year]   Cancelled Treatment:    Reason Eval/Treat Not Completed: Medical issues which prohibited therapy Per nurse, patient remains on vent with increased sedation. OT to sign off at this time as patient has been unable to participate since evaluation was ordered on 4/7. Please reorder if patient becomes medically able to participate in therapy.  Wynette Heckler, MS Acute Rehabilitation Department Office# [phone]  11/10/2023, 8:42 AM

## 2023-11-10 NOTE — Progress Notes (Signed)
 Case discussed with nursing and neurology. He is more alert now so mri brain and c-spine less crucial at this juncture. Will continue to wean sedation with stopping ketamine and using fentanyl/precedex. Hoping to make him alert enough for extubation. Can use prn versed as needed.   My additional cc time 16 minutes

## 2023-11-10 NOTE — Progress Notes (Signed)
 10 Days Post-Op Subjective: Intubated sedated  Objective: Vital signs in last 24 hours: Temp:  [97 F (36.1 C)-101.9 F (38.8 C)] 101.9 F (38.8 C) (04/13 0640) Pulse Rate:  [50-95] 89 (04/13 0700) Resp:  [10-22] 22 (04/13 0700) BP: (90-142)/(51-96) 135/74 (04/13 0700) SpO2:  [92 %-100 %] 98 % (04/13 0802) FiO2 (%):  [40 %] 40 % (04/13 0756) Weight:  [295 kg] 121 kg (04/13 0430)  Intake/Output from previous day: 04/12 0701 - 04/13 0700 In: 3248.1 [I.V.:1169.6; MW/UX:3244.0; IV Piggyback:203.8] Out: 3230 [Urine:3230] Intake/Output this shift: Total I/O In: 53.3 [I.V.:23; NG/GT:30.3] Out: -   Physical Exam:  Gen: intubated sedated Abd: soft incisions C/D/I, some ecchymosis on the left side of the abdomen GU: catheter in place clear yellow urine  Lab Results: Recent Labs    11/08/23 0302 11/10/23 0251  HGB 10.5* 11.4*  HCT 35.3* 36.8*   BMET Recent Labs    11/08/23 0302 11/10/23 0251  NA 145 144  K 4.0 4.4  CL 107 107  CO2 30 28  GLUCOSE 211* 238*  BUN 50* 88*  CREATININE 1.38* 1.57*  CALCIUM 8.8* 8.5*     Studies/Results: Rapid EEG Result Date: 11/09/2023 Arleene Lack, MD     11/09/2023  1:21 PM  Patient Name: Preston [name] MRN: [id number] Epilepsy Attending: Arleene Lack Referring Physician/Provider: Deneise Finlay D, NP Duration: 11/08/2023 1136 to 11/09/2023 1102 Patient history: 52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 Rapid EEG to evaluate for seizure Level of alertness: comatose/ lethargic AEDs during EEG study: Phenobarb, GBP Technical aspects: This EEG was obtained using a 10 lead EEG system positioned circumferentially without any parasagittal coverage (rapid EEG). Computer selected EEG is reviewed as  well as background features and all clinically significant events. Description: EEG showed continuous generalized 3 to 6 Hz theta-delta slowing. Hyperventilation and photic stimulation were not performed.   EEG was disconnected between 4/11/025 1707  to 1936 for imaging ABNORMALITY - Continuous slow, generalized IMPRESSION: This limited ceribell EEG is suggestive of moderate to severe diffuse encephalopathy. No seizures or epileptiform discharges were seen throughout the recording. If suspicion for interictal activity remains a concern, a conventional video EEG can be considered. [name]   CT ABDOMEN PELVIS WO CONTRAST Result Date: 11/08/2023 CLINICAL DATA:  Retroperitoneal hematoma, follow up Persistent fever/Large hematoma EXAM: CT ABDOMEN AND PELVIS WITHOUT CONTRAST TECHNIQUE: Multidetector CT imaging of the abdomen and pelvis was performed following the standard protocol without IV contrast. RADIATION DOSE REDUCTION: This exam was performed according to the departmental dose-optimization program which includes automated exposure control, adjustment of the mA and/or kV according to patient size and/or use of iterative reconstruction technique. COMPARISON:  CT abdomen pelvis 08/06/2023 FINDINGS: Lower chest: Bibasilar dependent airspace opacities, right greater than left. Hepatobiliary: Liver enlarged measuring up to 23 cm. No focal liver abnormality. No gallstones, gallbladder wall thickening, or pericholecystic fluid. No biliary dilatation. Pancreas: No focal lesion. Normal pancreatic contour. No surrounding inflammatory changes. No main pancreatic ductal dilatation. Spleen: Normal in size without focal abnormality. Adrenals/Urinary Tract: No adrenal nodule bilaterally. Nonspecific bilateral perinephric fat stranding. No nephrolithiasis and no hydronephrosis. No ureterolithiasis or hydroureter. The urinary bladder is fully decompressed with Foley catheter in good position. Limited evaluation of the urinary bladder due to streak artifact from bilateral femoral surgical hardware. Stomach/Bowel: Enteric tube tip and side port within the gastric lumen. Sigmoid colon resection noted. Stomach is within normal limits. No evidence of bowel wall  thickening or dilatation. Status  post appendectomy. Vascular/Lymphatic: Flattened inferior vena cava. No abdominal aorta or iliac aneurysm. Severe atherosclerotic plaque of the aorta and its branches. No abdominal, pelvic, or inguinal lymphadenopathy. Reproductive: Poorly visualized due to streak artifact originating from the bilateral femoral surgical hardware. Other: Trace volume right retroperitoneum high density free fluid suggestive of blood products (2:61). No intraperitoneal free gas. No organized fluid collection. Musculoskeletal: Supraumbilical ventral wall soft tissue free fluid and emphysema with fluid collection measuring approximately 13 x 3.5 cm. This is in the region of prior ventral wall hernia status post repair. No suspicious lytic or blastic osseous lesions. No acute displaced fracture. Multilevel degenerative changes of the spine. Bilateral total hip arthroplasties partially visualized. IMPRESSION: 1. Bibasilar dependent airspace opacities, right greater than left. Findings could represent a combination of atelectasis and/or infection/inflammation. Limited evaluation on this noncontrast study. 2. Trace volume right retroperitoneal blood products. 3. Supraumbilical ventral wall soft tissue free fluid and emphysema with associated fluid collection measuring approximately 13 x 3.5 cm in the region of prior ventral wall hernia status post repair. No definite recurrent hernia or enterocutaneous fistulization. Finding could represents abscess formation status post surgery with limited evaluation on this noncontrast study. Finding could also represent postsurgical changes-correlate with timing of surgery. Electronically Signed   By: Morgane  Naveau M.D.   On: 11/08/2023 18:51   CT HEAD WO CONTRAST ( ) Result Date: 11/08/2023 CLINICAL DATA:  Provided history: Headache, neuro deficit. EXAM: CT HEAD WITHOUT CONTRAST TECHNIQUE: Contiguous axial images were obtained from the base of the skull through  the vertex without intravenous contrast. RADIATION DOSE REDUCTION: This exam was performed according to the departmental dose-optimization program which includes automated exposure control, adjustment of the mA and/or kV according to patient size and/or use of iterative reconstruction technique. COMPARISON:  Head CT 01/05/2011. FINDINGS: Brain: Generalized parenchymal atrophy. Mild patchy and ill-defined hypoattenuation within the cerebral white matter, nonspecific but most often secondary to chronic small vessel ischemia. There is no acute intracranial hemorrhage. No demarcated cortical infarct. No extra-axial fluid collection. No evidence of an intracranial mass. No midline shift. Vascular: No hyperdense vessel.  Atherosclerotic calcifications. Skull: No calvarial fracture or aggressive osseous lesion. Sinuses/Orbits: No mass or acute finding within the imaged orbits. Mild mucosal thickening within the bilateral maxillary sinuses at the imaged levels. Other: 15 mm cystic-appearing cutaneous/subcutaneous right facial lesion (overlying the right zygomatic arch), possibly an epidermal inclusion cyst. IMPRESSION: 1. No acute intracranial hemorrhage or acute demarcated cortical infarct. 2. Mild cerebral white matter disease, nonspecific but most often secondary to chronic small vessel ischemia. 3. Generalized parenchymal atrophy. 4. Mild bilateral maxillary sinus mucosal thickening. Electronically Signed   By: Bascom Lily D.O.   On: 11/08/2023 11:04    Assessment/Plan: POD # 10  s/p right RAL partial nephrectomy and incisional hernia repair with mesh  - Appreciate pulmonary/critical care management.  Remains intubated due to ARDS. - Path - renal papillary adenoma (benign).  Reviewed with family. - Disposition will be dependent on medical issues. - Slight increase in creatinine, CT from 4/11 shows no hydronephrosis good urine output at 3 L.     LOS: 8 days   Aimee Houseman MD 11/10/2023, 8:04 AM [facility]

## 2023-11-11 ENCOUNTER — Inpatient Hospital Stay (HOSPITAL_COMMUNITY)

## 2023-11-11 DIAGNOSIS — K219 Gastro-esophageal reflux disease without esophagitis: Secondary | ICD-10-CM

## 2023-11-11 DIAGNOSIS — J9621 Acute and chronic respiratory failure with hypoxia: Secondary | ICD-10-CM | POA: Diagnosis not present

## 2023-11-11 DIAGNOSIS — I5032 Chronic diastolic (congestive) heart failure: Secondary | ICD-10-CM

## 2023-11-11 DIAGNOSIS — J441 Chronic obstructive pulmonary disease with (acute) exacerbation: Secondary | ICD-10-CM | POA: Diagnosis not present

## 2023-11-11 DIAGNOSIS — D49511 Neoplasm of unspecified behavior of right kidney: Secondary | ICD-10-CM | POA: Diagnosis not present

## 2023-11-11 DIAGNOSIS — Z720 Tobacco use: Secondary | ICD-10-CM | POA: Diagnosis not present

## 2023-11-11 LAB — GLUCOSE, CAPILLARY
Glucose-Capillary: 188 mg/dL — ABNORMAL HIGH (ref 70–99)
Glucose-Capillary: 243 mg/dL — ABNORMAL HIGH (ref 70–99)
Glucose-Capillary: 268 mg/dL — ABNORMAL HIGH (ref 70–99)
Glucose-Capillary: 189 mg/dL — ABNORMAL HIGH (ref 70–99)
Glucose-Capillary: 200 mg/dL — ABNORMAL HIGH (ref 70–99)

## 2023-11-11 LAB — TRIGLYCERIDES: Triglycerides: 280 mg/dL — ABNORMAL HIGH (ref <150)

## 2023-11-11 MED ORDER — METHYLNALTREXONE BROMIDE 12 MG/0.6ML ~~LOC~~ SOLN
12.0000 mg | Freq: Once | SUBCUTANEOUS | Status: AC
Start: 2023-11-11 — End: 2023-11-11
  Administered 2023-11-11: 12 mg via SUBCUTANEOUS
  Filled 2023-11-11: qty 0.6

## 2023-11-11 MED ORDER — GABAPENTIN 250 MG/5ML PO SOLN
400.0000 mg | Freq: Three times a day (TID) | ORAL | Status: DC
  Administered 2023-11-11 – 2023-11-12 (×4): 400 mg
  Filled 2023-11-11 (×6): qty 8

## 2023-11-11 MED ORDER — POLYVINYL ALCOHOL 1.4 % OP SOLN
2.0000 [drp] | Freq: Four times a day (QID) | OPHTHALMIC | Status: DC
  Administered 2023-11-11 – 2023-11-14 (×14): 2 [drp] via OPHTHALMIC
  Filled 2023-11-11: qty 15

## 2023-11-11 MED ORDER — LINEZOLID 600 MG/300ML IV SOLN
600.0000 mg | Freq: Two times a day (BID) | INTRAVENOUS | Status: DC
  Administered 2023-11-11 – 2023-11-12 (×3): 600 mg via INTRAVENOUS
  Filled 2023-11-11 (×4): qty 300

## 2023-11-11 MED ORDER — IOHEXOL 9 MG/ML PO SOLN
ORAL | Status: AC
Start: 2023-11-11 — End: ?
  Filled 2023-11-11: qty 1000

## 2023-11-11 MED ORDER — PIVOT 1.5 CAL PO LIQD
1000.0000 mL | ORAL | Status: DC
  Administered 2023-11-12: 1000 mL
  Filled 2023-11-11 (×2): qty 1000

## 2023-11-11 MED ORDER — SODIUM CHLORIDE (PF) 0.9 % IJ SOLN
INTRAMUSCULAR | Status: AC
  Filled 2023-11-11: qty 50

## 2023-11-11 MED ORDER — IOHEXOL 9 MG/ML PO SOLN
500.0000 mL | ORAL | Status: AC
  Administered 2023-11-11 (×2): 500 mL via ORAL

## 2023-11-11 MED ORDER — SMOG ENEMA
960.0000 mL | Freq: Once | RECTAL | Status: DC
  Filled 2023-11-11: qty 960

## 2023-11-11 MED ORDER — IOHEXOL 300 MG/ML  SOLN
100.0000 mL | Freq: Once | INTRAMUSCULAR | Status: AC | PRN
  Administered 2023-11-11: 100 mL via INTRAVENOUS

## 2023-11-11 MED ORDER — SODIUM CHLORIDE 0.9 % IV SOLN
500.0000 mg | Freq: Three times a day (TID) | INTRAVENOUS | Status: DC
  Administered 2023-11-11 – 2023-11-12 (×3): 500 mg via INTRAVENOUS
  Filled 2023-11-11 (×5): qty 10

## 2023-11-11 MED ORDER — POLYETHYLENE GLYCOL 3350 17 G PO PACK
17.0000 g | PACK | Freq: Two times a day (BID) | ORAL | Status: DC
  Administered 2023-11-11 (×2): 17 g
  Filled 2023-11-11 (×2): qty 1

## 2023-11-11 MED ORDER — PROSOURCE TF20 ENFIT COMPATIBL EN LIQD
60.0000 mL | Freq: Three times a day (TID) | ENTERAL | Status: DC
  Administered 2023-11-11 – 2023-11-12 (×4): 60 mL
  Filled 2023-11-11 (×4): qty 60

## 2023-11-11 NOTE — Progress Notes (Incomplete)
 Palliative Care Progress Note  Patient continues to be critically ill, difficulty with weaning due to agitation and sedation challenges.  His abdomen looks larger and is more tense than when I saw him on Friday.   No Bowel Movement for 8 days.  He attempts to awaken to loud stimulation, even on 40+mcs of propofol  and fentanyl.

## 2023-11-11 NOTE — Progress Notes (Signed)
 Physical Therapy Discharge Patient Details Name: Preston [name] MRN: [id number] DOB: [birth year] Today's Date: 11/11/2023 Time:  -     Patient discharged from PT services secondary to medical decline - will need to re-order PT to resume therapy services.Remains on ventilator  and sedated.  Please see latest therapy progress note for current level of functioning and progress toward goals.    Progress and discharge plan discussed with patient and/or caregiver: Patient unable to participate in discharge planning and no caregivers available  GP   Abelina Hoes PT Acute Rehabilitation Services Office [phone] Weekend pager-[phone]   Dareen Ebbing 11/11/2023, 7:05 AM

## 2023-11-11 NOTE — Plan of Care (Signed)
  Problem: Clinical Measurements: Goal: Cardiovascular complication will be avoided Outcome: Progressing   Problem: Health Behavior/Discharge Planning: Goal: Ability to manage health-related needs will improve Outcome: Not Progressing   Problem: Clinical Measurements: Goal: Will remain free from infection Outcome: Not Progressing Goal: Diagnostic test results will improve Outcome: Not Progressing Goal: Respiratory complications will improve Outcome: Not Progressing   Problem: Coping: Goal: Level of anxiety will decrease Outcome: Not Progressing

## 2023-11-11 NOTE — Progress Notes (Signed)
 Palliative Care Progress Note  Patient continues to be critically ill, difficulty with weaning due to agitation and sedation challenges.  PNA unchanged on imaging, completed antibiotics for this per CCM notes.  His abdomen looks larger and is more tense than when I saw him on Friday. A repeat CT with contrast has been ordered for today.  No Bowel Movement for 8 days. If we can treat this, his agitation may improve significantly and his ammonia level may come down. I ordered an aggressive bowel regimen including SMOG and Relistor.  He continues to have fevers of >101 daily. No pan culture info available for review, his urine looks slightly dark but no sediment, Surgery following incisions and abdomen. Antibiotics(Zosyn) were stopped over the weekend. He also has an increasing white count now >18.  He attempts to awaken to loud stimulation, even on 40+mcgs of propofol  and fentanyl. His eyes have upward gaze -corneas are very dry.MRI of Brain today is ordered.  Ketamine was stopped over the weekend-unclear why this was stopped and not titrated up- he was given more benzodiazapine.Now on propofol.  Family with increasing frustration concern about his level of suffering and prognosis. Will await MRI results and regroup on goals of care pending results.  Flora Humphreys, DO Palliative Medicine

## 2023-11-11 NOTE — Progress Notes (Signed)
 Nutrition Follow-up  DOCUMENTATION CODES:   Obesity unspecified  INTERVENTION:  - Due to patient being back on propofol with increasing requirements, will adjust to lower TF rate to account for extra kcals from fat.  Pivot 1.5 at 40 ml/h (960 ml per day) ProSource TF20 60ml TID Provides 1680 kcal (+1246 kcals from propofol), 150 gm protein, 720 ml free water daily  - FWF per CCM/MD.     NUTRITION DIAGNOSIS:   Inadequate oral intake related to inability to eat as evidenced by NPO status. *ongoing  GOAL:   Patient will meet greater than or equal to 90% of their needs *met with TF  MONITOR:   Vent status, Labs, Weight trends, TF tolerance  REASON FOR ASSESSMENT:   Consult Enteral/tube feeding initiation and management  ASSESSMENT:   53 y.o. male with PMH significant for left partial nephrectomy for renal cell carcinoma, abdominal incisional hernia, HLD, CHF, COPD, and GERD who presented for elective abdominal incision hernia repair with combination right robotic assisted laparoscopic partial nephrectomy.  4/3 admitted for elective hernia repair and partial right nephrectomy  4/4 Soft diet 4/7 HH diet; Signs of alcohol withdrawal and AMS  4/8 Intubated in the early AM for AMS, agitation, and increased work of breathing 4/9 Pivot scheduled to start in the afternoon but was held due to high NGT output 4/10 KUB obtained and showed no significant ileus, TF started @ 20 overnight; in AM [facility] changed TF order goal to 29mL/hr 4/11 TF increased to 25mL/hr, then 32mL/hr per [facility]   Patient remains intubated on ventilator support MV: 10.8 L/min Temp (24hrs), Avg:99.7 F (37.6 C), Min:98.5 F (36.9 C), Max:101.1 F (38.4 C)  No family at bedside at time of visit. TF infusing at 54mL/hr.  Discussed with bedside RN. Patient back on propofol with increasing requirements.  Discussed with CCM, will plan to decrease TF rate to account for calories from propofol. GOC discussions  ongoing. Patient now DNR. Plan for MRI of pts brain for further eval AMS. Family do not feel patient would want a trach. Possible one-way extubation pending MRI results and progress.   Admit weight: 297# Current weight: 285# I&O's: -2.7L since admit  Medications reviewed and include: Dulcolax, Fibercon BID, Miralax, vitamin B12 (ends today), 1 mg folic acid, MVI, High dose thiamine (500mg  TID x2 days) Fentanyl Propofol @ 47.2 mL/hr (provides 1246 kcals over 24 hours)  Labs reviewed:  No BMP today   Diet Order:   Diet Order             Diet NPO time specified Except for: Ice Chips, Sips with Meds  Diet effective now           Diet - low sodium heart healthy                   EDUCATION NEEDS:  Not appropriate for education at this time  Skin:  Skin Assessment: Reviewed RN Assessment  Last BM:  4/10  Height:  Ht Readings from Last 1 Encounters:  11/05/23 6\' 3"  (1.905 m)   Weight:  Wt Readings from Last 1 Encounters:  11/11/23 129.7 kg   Ideal Body Weight:  89.09 kg  BMI:  Body mass index is 35.74 kg/m.  Estimated Nutritional Needs:  Kcal:  2400-2650 kcals Protein:  150-180 grams Fluid:  >/= 2.4L    Shelle Iron RD, LDN Contact via Secure Chat.

## 2023-11-11 NOTE — Progress Notes (Signed)
 eLink Physician-Brief Progress Note Patient Name: Preston [name] DOB: Jul 15, 1971 MRN: [id number]   Date of Service  11/11/2023  HPI/Events of Note  Notified of CT  result: Stable size of the lobulated subcutaneous fluid collection in the anterior abdominal wall. Air has resolved within the fluid collection. Abscess is not excluded in the appropriate clinical setting. Increasing airspace consolidation R>L lung base  MRI No evidence of acute intracranial abnormality.   eICU Interventions  Already started on linezolid and meropenem earlier today Bedside rounding team aware of the presence of the abdominal fluid collection.     Intervention Category Intermediate Interventions: Diagnostic test evaluation  Turner Gains 11/11/2023, 10:20 PM

## 2023-11-11 NOTE — Progress Notes (Signed)
 NAME:  Preston [name], MRN:  [id number], DOB:  04/30/1971, LOS: 9 ADMISSION DATE:  10/31/2023, CONSULTATION DATE:  11/04/2023 REFERRING MD:  Dr. Lanae Boast - [facility], CHIEF COMPLAINT:  Hypoxia and concern for alcohol    History of Present Illness:  Preston [name] is a 53 y.o. with a past medical history significant for left partial nephrectomy for renal cell carcinoma, abdominal incisional hernia, TN, HLD, CHF, COPD, and GERD who presented for elective abdominal incision hernia repair with combination right robotic assisted laparoscopic partial nephrectomy.  Per chart review it appears patient tolerated procedures well.    Postop day 1 patient was seen with mild hypoxia with SpO2 84% on room air this was felt due to splinting secondary to pain and pulmonary hygiene was encouraged.  Hospitalist service was consulted 4/5 for persistent hypoxemia.   By 4/7 hypoxemia continued to persistent with AMS as well, concerning for ETOH withdrawal, PCCM consulted   Pertinent  Medical History  Left partial nephrectomy for renal cell carcinoma, abdominal incisional hernia, TN, HLD, CHF, COPD, and GERD  Significant Hospital Events: Including procedures, antibiotic start and stop dates in addition to other pertinent events   4/3 admitted for elective hernia repair and partial right nephrectomy  4/4 hypoxic on RA  4/5 [facility] consulted  4/7 Signs of alcohol withdrawal and AMS, intubated overnight for increased work of breathing 4/8 high Fio2/PEEP needs. PNA vs edema. Met ARDS criteria. Zosyn started and diuresed.  4/9: weaned to minimal support, became hypoxic and back to 50% and 10 PEEP 4/11 unresponsive on ventilator this a.m with upward forced gaze., continuous sedation stopped.  Within about an hour patient began spontaneously moving during deep suctioning but upward forced gaze persist.  Head CT and Spot EEG pending  Interim History / Subjective:  With weaning sedation Patient is not redirectable Upward gaze  preference  Objective   Blood pressure (!) 94/56, pulse 65, temperature (!) 100.4 F (38 C), temperature source Esophageal, resp. rate 15, height 6\' 3"  (1.905 m), weight 121 kg, SpO2 95%.    Vent Mode: PRVC FiO2 (%):  [40 %] 40 % Set Rate:  [22 bmp] 22 bmp Vt Set:  [500 mL] 500 mL PEEP:  [5 cmH20] 5 cmH20 Plateau Pressure:  [12 cmH20-26 cmH20] 12 cmH20   Intake/Output Summary (Last 24 hours) at 11/11/2023 0743 Last data filed at 11/11/2023 0720 Gross per 24 hour  Intake 3542.8 ml  Output 2265 ml  Net 1277.8 ml   Filed Weights   11/08/23 0406 11/09/23 0500 11/10/23 0430  Weight: 129 kg 127 kg 121 kg    Examination:  Gen:      Sedated, acutely ill-appearing HEENT: Endotracheal tube in place  lungs:    Decreased breath sounds bilaterally  CV:         S1-S2 appreciated Abd:      Bowel sounds appreciated Ext:    No edema Skin:      Warm and dry; no rashes Neuro:   Not following commands, moving all extremities  I reviewed nursing notes, Consultant notes, hospitalist notes, last 24 h vitals and pain scores, last 48 h intake and output, last 24 h labs and trends, and last 24 h imaging results.   Resolved Hospital Problem list     Assessment & Plan:   Acute on chronic respiratory failure COPD with exacerbation Aspiration pneumonia versus lung injury Active tobacco user - Continue mechanical ventilation -Target TVol 6-8cc/kgIBW -Target Plateau Pressure < 30cm H20 -Target driving  pressure less than 15 cm of water -Target PaO2 55-65: titrate PEEP/FiO2 per protocol -Ventilator associated pneumonia prevention protocol - Continue Brovana, Yupelri, Pulmicort -Completed course of antibiotics and steroids  Acute encephalopathy ICU delirium Alcohol withdrawal with delirium - He has passed the timeline for withdrawal and delirium symptoms - Weaned off ketamine, remains on fentanyl, Precedex, scheduled clonazepam - Gabapentin been titrated down, - On Cymbalta - On thiamine,  folate, multivitamin  Right renal mass s/p partial nephrectomy -surgical path with renal papillary adenoma -CT abdomen and pelvis demonstrates postop hematoma versus seroma in the abdominal wall -Plan is to continue monitoring  AKI - Continue monitoring - Avoid nephrotoxic medications - Maintain renal perfusion  Chronic diastolic congestive heart failure with ejection fraction of 50-55% -Telemetry monitoring - Continue statin - Hold diuresis  Type 2 diabetes with steroid-induced hyperglycemia - Continue SSI  GERD - Continue PPI  Had a long discussion with family at bedside, patient's daughter and spouse - Patient will not want to be on a ventilator long-term - Will not agree to a tracheostomy - Persistent encephalopathy despite trying to wean off medications - I did reassure them that we are supporting as aggressively as we can and the timeline for turnaround of his mental status is difficult to predict - We will be getting an MRI for further evaluation of his altered mental status - Continue to engage with palliative care - Discussed one-way extubation if he continues to remain encephalopathic despite all efforts   Best Practice (right click and "Reselect all SmartList Selections" daily)   Diet/type: NPO tube feeds DVT prophylaxis LMWH Pressure ulcer(s): N/A GI prophylaxis: PPI Lines: N/A Foley:  Yes, and it is still needed Code Status:  full code Last date of multidisciplinary goals of care discussion: Discussed with patient's daughter and spouse at bedside   Critical care time:    The patient is critically ill with multiple organ systems failure and requires high complexity decision making for assessment and support, frequent evaluation and titration of therapies, application of advanced monitoring technologies and extensive interpretation of multiple databases. Critical Care Time devoted to patient care services described in this note independent of APP/resident time  (if applicable)  is 33 minutes.   Myer Artis MD [facility] Personal pager: See Amion If unanswered, please page CCM On-call: #[phone]

## 2023-11-11 NOTE — Progress Notes (Signed)
 Discussed with family at bedside  Discussed with Dr. Glady Laming  Ongoing concerns with his fever, metabolic encephalopathy  MRI is pending  Will get an abdominal CT to rule out any source of infection since his just had surgery  Empirically started on antibiotics-meropenem and linezolid   Obtain cultures  Cct 30 min

## 2023-11-11 NOTE — Plan of Care (Signed)
  Problem: Clinical Measurements: Goal: Cardiovascular complication will be avoided Outcome: Progressing   Problem: Nutrition: Goal: Adequate nutrition will be maintained Outcome: Progressing   Problem: Elimination: Goal: Will not experience complications related to urinary retention Outcome: Progressing   Problem: Pain Managment: Goal: General experience of comfort will improve and/or be controlled Outcome: Progressing   Problem: Skin Integrity: Goal: Risk for impaired skin integrity will decrease Outcome: Progressing   Problem: Nutritional: Goal: Maintenance of adequate nutrition will improve Outcome: Progressing   Problem: Coping: Goal: Level of anxiety will decrease Outcome: Not Progressing   Problem: Safety: Goal: Ability to remain free from injury will improve Outcome: Not Progressing   Problem: Safety: Goal: Non-violent Restraint(s) Outcome: Not Progressing

## 2023-11-11 NOTE — Progress Notes (Signed)
 Patient transported on ventilator to MRI and back to 1225 with no complications. Vitals stable.

## 2023-11-11 NOTE — Progress Notes (Signed)
 11/11/2023  Preston [name] [id number] 20-Jul-1971  CARE TEAM: PCP: Collins Dean, NP  Outpatient Care Team: Patient Care Team: Collins Dean, NP as PCP - General (Nurse Practitioner) Hugh Madura, MD as PCP - Cardiology (Cardiology) Evangeline Hilts, MD as Consulting Physician (Gastroenterology) Candyce Champagne, MD as Consulting Physician (General Surgery) Florencio Hunting, MD as Consulting Physician (Urology)  Inpatient Treatment Team: Treatment Team:  Florencio Hunting, MD Candyce Champagne, MD Carrolyn Clan, [place], MD Lesa Rape, MD Pccm, Md, MD Algis Ingles, MD Teddi Favors, RN Fayne Hoover, RN Covil, Janell Media, NT Thresea Flor, PT   Problem List:   Principal Problem:   Neoplasm of right kidney Active Problems:   Tobacco abuse   HLD (hyperlipidemia)   Alcohol abuse   Chronic diastolic congestive heart failure (HCC)   Gastroesophageal reflux disease without esophagitis   Type 2 diabetes mellitus treated without insulin (HCC)   Essential hypertension   Acute kidney injury (HCC)   Obesity, Class II, BMI 35-39.9   Diastasis recti   Incarcerated incisional hernia s/p repair w mesh 10/31/2023   History of Left Renal cell cancer   Hyponatremia   Acute hypoxemic respiratory failure (HCC)   Alcohol withdrawal delirium (HCC)   ABLA (acute blood loss anemia)   Primary hypertension   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  Swiss cheese hernias going down infraumbilically connecting with his prior low midline incision as  well.  Umbilical hernia.   Type of ventral wall repair:  Laparoscopic underlay repair .with Primary repair of largest hernia Placement of mesh: Centrally intraperitoneal with edges tucked into RECTRORECTUS & preperitoneal space Name of mesh: Bard Ventralight dual sided (polypropylene / Seprafilm) Size of mesh: 33x27cm Orientation: Vertical Mesh overlap:  5-7cm  10/31/2023   Postoperative diagnosis: Right renal neoplasm   Procedure:   Right robotic-assisted laparoscopic partial nephrectomy Intraoperative renal ultrasonography   Surgeon: Izetta Marshall. M.D.   Intraoperative findings:       1. Warm renal ischemia time: 22 minutes       2. Intraoperative renal ultrasound findings: There was a small cystic appearing mass that measured about 1 cm adjacent to a larger renal cyst.   Drains: # 15 Blake perinephric drain  FINAL MICROSCOPIC DIAGNOSIS:  A. 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 PERINEPHRIC FAT, EXCISION: Benign fibroadipose tissue  C. HERNIA SAC: Benign fibromuscular and adipose tissue     Assessment [facility] Stay = 9 days) 11 Days Post-Op    Worsening delirium and confusion and hypoxia most likely due to alcohol withdrawal and fluid overload.  Encephalopathy more prevalent  Plan:  Main issue seems to be confusion and agitation and oversedation.  Numerous regimens have been adjusted.  Will defer to critical care and neurology.  Sounds like they are trying to wean off phenobarbital and adjust regimen to carefully sedate him on the vent and allow his alcohol and other substance withdrawals to proceed  safely.  Challenging to thread the needle  Pain control per critical care.  Challenging in this obese male with chronic pain issues.  Multimodal home regimen Cymbalta, BuSpar, and gabapentin.    Fever of uncertain etiology but suction on ET tube seems thick  and that raises strong suspicion pneumonia.  InitialTASP not severely concerning.  No fevers last 24hrs off ABx -no strong evidence of infection.  Postoperative abdominal wall hematoma/seroma not surprising but no evidence of abscess.  Area is soft and flat with no cellulitis abscess or drainage.  No fevers off antibiotics.  Leave it alone.  Follow WBC curve in the setting of bolus steroids.  Hopefully should start to taper down.  He is not hemodynamic declining and abdomen is stable to improving.  .  Ventilatory support.  Defer to extubation to pulmonary critical care.  Vent settings minimal but with some moderate secretions.    Fiber bowel regimen.  Bowel function returning.    TF feeds to goal.    Fiber bowel regimen with rectal suppositories.  Continue FiberCon.  Increase MiraLAX.  Pathology on renal mass benign renal papillary adenoma.  Reassuring.    Foley catheter for now.  Hopefully can remove once extubated.    monitor electrolytes & replace as needed  Keep K>4, Mg>2, Phos>3  -VTE prophylaxis- SCDs.  Anticoagulation prophyllaxis SQ as appropriate.  Okay to fully anticoagulate if there are concerns  I updated the patient's status to the patient's nurse  Recommendations were made.  Questions were answered.  She expressed understanding & appreciation.  -Disposition: TBD I think the patient is slowly stabilizing.  Vent settings minimal.  Renal function adequate esp s/p partial nephrectomies.  Bowel function has returned.  The biggest challenge is adequate sedation on the vent but not overdoing it.  Appreciate critical care, neurology and multispecialty care.  Hopefully will gradually improve.  I do not see a point of futility despite slow recovery.  Defer to critical care service.   I reviewed nursing notes, last 24 h vitals and pain scores, last 48 h intake and output, last 24 h labs and trends, and last 24 h imaging results.  I have reviewed this patient's available data,  including medical history, events of note, test results, etc as part of my evaluation.   A significant portion of that time was spent in counseling. Care during the described time interval was provided by me.  This care required moderate level of medical decision making.  11/11/2023    Subjective: (Chief complaint)  Patient a little more responsive on phenobarbital.  Back on propofol.  Continuing Precedex.  Did get a little agitated and needed extra Versed.  To be to goal.  Vent setting minimal.  Still with some low-volume thickish secretions.  Objective:  Vital signs:  Vitals:   11/11/23 0400 11/11/23 0403 11/11/23 0500 11/11/23 0600  BP: (!) 103/59  (!) 91/53 (!) 94/56  Pulse: 68  68 65  Resp: 14 (!) 22 18 15   Temp:   99.9 F (37.7 C) (!) 100.4 F (38 C)  TempSrc:   Axillary Esophageal  SpO2: 95%  96% 95%  Weight:      Height:        Last BM Date : 11/07/23  Intake/Output   Yesterday:  04/13 0701 - 04/14 0700 In: 1610 [I.V.:1376; RU/EA:5409; IV Piggyback:165] Out: 2265 [Urine:2265] This shift:  Total I/O In: 2078.3 [I.V.:834.3; NG/GT:1189; IV Piggyback:55] Out: 815 [Urine:815]  Bowel function:  Flatus: YES  BM:  No  Drain: (No drain)   Physical Exam: Clinical ICU nurse team & Dr Laverle Patter in room with me  General: Pt resting intubated and sedated.  no acute distress.  Calm. Eyes: PERRL  Sclera clear.  No icterus Neuro: No focal sensory/motor deficits. Lymph: No head/neck/groin lymphadenopathy Psych: Sedated at this point.  No more agitation/delirium  HENT: Normocephalic, Mucus membranes moist.  No thrush.  Orogastric tube just placed. Neck: Supple, No tracheal deviation.  No obvious thyromegaly Chest: No pain to chest wall compression.  Good respiratory excursion.  No audible wheezing but using abdominal wall to breathe.  Minimal conversational dyspnea. CV:  Pulses intact.  Regular rhythm.  No major extremity edema MS: Normal AROM mjr joints.  No  obvious deformity  Abdomen: Obese Soft.  Nondistended.  Nontender.  Ecchymosis abdominal stable and slowly resolving.   Incisions clean dry intact.  No cellulitis nor drainage.  No fluctuance.  No guarding or peritonitis.  Argues against infection.   Ext:   No deformity.  No mjr edema.  No cyanosis Skin: No petechiae / purpurea.  No major sores.  Warm and dry    Results:    SURGICAL PATHOLOGY CASE: [id number] PATIENT: Semaje Donatelli Surgical Pathology Report     Clinical History: Right renal mass, ventral incisional incarcerated hernia (jlr)     FINAL MICROSCOPIC DIAGNOSIS:  A. 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 PERINEPHRIC FAT, EXCISION: Benign fibroadipose tissue  C. HERNIA SAC: Benign fibromuscular and adipose tissue     Ke[place]ani Fruin DESCRIPTION:  A: Specimen is received fresh and consists of a 4.7 x 2.8 x 1.7 cm piece of tan soft tissue, with a small amount of overlying tan-yellow adipose tissue.  Resection margin is inked black, specimen is serially sectioned reveal tan parenchyma.  There is a 1.0 x 1.0 x 0.9 cm tan-white, solid, well-circumscribed lesion identified.  Lesion abuts the outer capsule, and measures 0.2 cm to the inked resection margin.  A 1.1 x 1.0 x 0.9 cm cortical cyst is identified.  No other lesions are identified. Representative sections are submitted in 5 cassettes. 1-3 = tan-white lesion 4 and 5 = cortical cyst  B: Specimen is received fresh, and consists of a 4.1 x 2.8 x 1.3 cm portion of tan-yellow adipose tissue.  Sectioning reveals grossly unremarkable cut surfaces.  Representative sections are submitted in 1 cassette.  C: Specimen is received fresh and consists of an 8.0 x 7.1 x 2.3 cm aggregate of tan-pink fibromembranous tissue and attached adipose tissue.  Sectioning reveals focally fibrotic cut surfaces. Representative  sections are submitted in 3 cassettes.  Lovey Newcomer 11/01/2023)   Final Diagnosis performed by Marlena Clipper, MD.   Electronically signed 11/04/2023 Technical and / or Professional components performed at [facility], [address] [place], [place] [address].  Immunohistochemistry Technical component (if applicable) was performed at [facility]. [address], [place], [place] [address].   IMMUNOHISTOCHEMISTRY DISCLAIMER (if applicable): Some of these immunohistochemical stains may have been developed and the performance characteristics determine by [facility]. Some may not have been cleared or approved by the U.S. Food and Drug Administration. The FDA has determined that such clearance or approval is not necessary. This test is used for clinical purposes. It should not be regarded as investigational or for research. This laboratory is certified under the Clinical Laboratory Improvement Amendments of 1988 (  CLIA-88) as qualified to perform high complexity clinical laboratory testing.  The controls stained appropriately.   IHC stains are performed on formalin fixed, paraffin embedded tissue using a 3,3"diaminobenzidine (DAB) chromogen and Leica Bond Autostainer System. The staining intensity of the nucleus is score manually and is reported as the percentage of tumor cell nuclei demonstrating specific nuclear staining. The specimens are fixed in 10% Neutral Formalin for at least 6 hours and up to 72hrs. These tests are validated on decalcified tissue. Results should be interpreted with caution given the possibility of false negative results on decalcified specimens. Antibody Clones are as follows ER-clone 32F, PR-clone 16, Ki67- clone MM1. Some of these immunohistochemical stains may have been developed and the performance characteristics determined by [facility].    Cultures: Recent Results (from the past 720 hours)  MRSA  Next Gen by PCR, Nasal     Status: None   Collection Time: 11/04/23  6:08 PM   Specimen: Nasal Mucosa; Nasal Swab  Result Value Ref Range Status   MRSA by PCR Next Gen NOT DETECTED NOT DETECTED Final    Comment: (NOTE) The GeneXpert MRSA Assay (FDA approved for NASAL specimens only), is one component of a comprehensive MRSA colonization surveillance program. It is not intended to diagnose MRSA infection nor to guide or monitor treatment for MRSA infections. Test performance is not FDA approved in patients less than 85 years old. Performed at [facility], [address] [place], [place] [address]   Culture, blood (Routine X 2) w Reflex to ID Panel     Status: None   Collection Time: 11/05/23  8:22 AM   Specimen: BLOOD  Result Value Ref Range Status   Specimen Description   Final    BLOOD BLOOD RIGHT ARM AEROBIC BOTTLE ONLY ANAEROBIC BOTTLE ONLY Performed at [facility], [address], [place], [place] [address]    Special Requests   Final    BOTTLES DRAWN AEROBIC AND ANAEROBIC Blood Culture results may not be optimal due to an inadequate volume of blood received in culture bottles Performed at [facility], [address] [place], [place] [address]    Culture   Final    NO GROWTH 5 DAYS Performed at [facility], [address], [place], [place] [address]    Report Status 11/10/2023 FINAL  Final  Culture, blood (Routine X 2) w Reflex to ID Panel     Status: None   Collection Time: 11/05/23  8:22 AM   Specimen: BLOOD  Result Value Ref Range Status   Specimen Description   Final    BLOOD BLOOD RIGHT HAND AEROBIC BOTTLE ONLY ANAEROBIC BOTTLE ONLY Performed at [facility], [address] [place], [place] [address]    Special Requests   Final    BOTTLES DRAWN AEROBIC AND ANAEROBIC Blood Culture adequate volume Performed at [facility], [address] [place], [place]  [address]    Culture   Final    NO GROWTH 5 DAYS Performed at [facility], [address] [place], [place] [address]    Report Status 11/10/2023 FINAL  Final  Culture, Respiratory w Gram Stain     Status: None   Collection Time: 11/05/23  9:56 AM   Specimen: SPU; Respiratory  Result Value Ref Range Status   Specimen Description   Final    SPUTUM Performed at [facility], [address], [place], [place] [address]  Special Requests   Final    SPUTUM Performed at [facility], [address], [place], [place] [address]    Gram Stain   Final    NO WBC SEEN FEW GRAM POSITIVE COCCI IN PAIRS IN CHAINS    Culture   Final    RARE Normal respiratory flora-no Staph aureus or Pseudomonas seen Performed at [facility], [address] [place], [place] [address]    Report Status 11/07/2023 FINAL  Final    Labs: Results for orders placed or performed during the hospital encounter of 10/31/23 (from the past 48 hours)  Triglycerides     Status: Abnormal   Collection Time: 11/09/23  7:13 AM  Result Value Ref Range   Triglycerides 444 (H) <150 mg/dL    Comment: Performed at [facility], [address], [place], [place] [address]  Glucose, capillary     Status: Abnormal   Collection Time: 11/09/23  7:25 AM  Result Value Ref Range   Glucose-Capillary 252 (H) 70 - 99 mg/dL    Comment: Glucose reference range applies only to samples taken after fasting for at least 8 hours.   Comment 1 Notify RN    Comment 2 Document in Chart   Glucose, capillary     Status: Abnormal   Collection Time: 11/09/23 11:37 AM  Result Value Ref Range   Glucose-Capillary 319 (H) 70 - 99 mg/dL    Comment: Glucose reference range applies only to samples taken after fasting for at least 8 hours.   Comment 1 Notify RN    Comment 2 Document in Chart   Glucose, capillary     Status: Abnormal   Collection Time: 11/09/23  3:44 PM  Result Value Ref  Range   Glucose-Capillary 305 (H) 70 - 99 mg/dL    Comment: Glucose reference range applies only to samples taken after fasting for at least 8 hours.   Comment 1 Notify RN    Comment 2 Document in Chart   Phenobarbital level     Status: None   Collection Time: 11/09/23  4:18 PM  Result Value Ref Range   Phenobarbital 17.1 15.0 - 40.0 ug/mL    Comment: Performed at [facility], [address] [place], [place] [address]  Glucose, capillary     Status: Abnormal   Collection Time: 11/09/23  7:29 PM  Result Value Ref Range   Glucose-Capillary 327 (H) 70 - 99 mg/dL    Comment: Glucose reference range applies only to samples taken after fasting for at least 8 hours.   Comment 1 Notify RN    Comment 2 Document in Chart   Glucose, capillary     Status: Abnormal   Collection Time: 11/09/23 11:50 PM  Result Value Ref Range   Glucose-Capillary 229 (H) 70 - 99 mg/dL    Comment: Glucose reference range applies only to samples taken after fasting for at least 8 hours.  CBC     Status: Abnormal   Collection Time: 11/10/23  2:51 AM  Result Value Ref Range   WBC 18.0 (H) 4.0 - 10.5 K/uL   RBC 3.55 (L) 4.22 - 5.81 MIL/uL   Hemoglobin 11.4 (L) 13.0 - 17.0 g/dL   HCT 29.5 (L) 62.1 - 30.8 %   MCV 103.7 (H) 80.0 - 100.0 fL   MCH 32.1 26.0 - 34.0 pg   MCHC 31.0 30.0 - 36.0 g/dL   RDW 65.7 84.6 - 96.2 %   Platelets  256 150 - 400 K/uL   nRBC 0.0 0.0 - 0.2 %    Comment: Performed at [facility], [address], [place], [place] [address]  Comprehensive metabolic panel     Status: Abnormal   Collection Time: 11/10/23  2:51 AM  Result Value Ref Range   Sodium 144 135 - 145 mmol/L   Potassium 4.4 3.5 - 5.1 mmol/L    Comment: HEMOLYSIS AT THIS LEVEL MAY AFFECT RESULT   Chloride 107 98 - 111 mmol/L   CO2 28 22 - 32 mmol/L   Glucose, Bld 238 (H) 70 - 99 mg/dL    Comment: Glucose reference range applies only to samples taken after fasting for at least 8 hours.   BUN 88 (H) 6 -  20 mg/dL   Creatinine, Ser 0.98 (H) 0.61 - 1.24 mg/dL   Calcium 8.5 (L) 8.9 - 10.3 mg/dL   Total Protein 6.3 (L) 6.5 - 8.1 g/dL   Albumin 3.2 (L) 3.5 - 5.0 g/dL   AST 39 15 - 41 U/L    Comment: HEMOLYSIS AT THIS LEVEL MAY AFFECT RESULT   ALT 37 0 - 44 U/L    Comment: HEMOLYSIS AT THIS LEVEL MAY AFFECT RESULT   Alkaline Phosphatase 44 38 - 126 U/L   Total Bilirubin 0.9 0.0 - 1.2 mg/dL    Comment: HEMOLYSIS AT THIS LEVEL MAY AFFECT RESULT   GFR, Estimated 53 (L) >60 mL/min    Comment: (NOTE) Calculated using the CKD-EPI Creatinine Equation (2021)    Anion gap 9 5 - 15    Comment: Performed at [facility], [address], [place], [place] [address]  Glucose, capillary     Status: Abnormal   Collection Time: 11/10/23  4:14 AM  Result Value Ref Range   Glucose-Capillary 178 (H) 70 - 99 mg/dL    Comment: Glucose reference range applies only to samples taken after fasting for at least 8 hours.  Glucose, capillary     Status: Abnormal   Collection Time: 11/10/23  7:30 AM  Result Value Ref Range   Glucose-Capillary 199 (H) 70 - 99 mg/dL    Comment: Glucose reference range applies only to samples taken after fasting for at least 8 hours.  Glucose, capillary     Status: Abnormal   Collection Time: 11/10/23 11:32 AM  Result Value Ref Range   Glucose-Capillary 258 (H) 70 - 99 mg/dL    Comment: Glucose reference range applies only to samples taken after fasting for at least 8 hours.  Glucose, capillary     Status: Abnormal   Collection Time: 11/10/23  3:49 PM  Result Value Ref Range   Glucose-Capillary 300 (H) 70 - 99 mg/dL    Comment: Glucose reference range applies only to samples taken after fasting for at least 8 hours.  Glucose, capillary     Status: Abnormal   Collection Time: 11/10/23  7:48 PM  Result Value Ref Range   Glucose-Capillary 220 (H) 70 - 99 mg/dL    Comment: Glucose reference range applies only to samples taken after fasting for at least 8 hours.    Comment 1 Notify RN    Comment 2 Document in Chart   Glucose, capillary     Status: Abnormal   Collection Time: 11/10/23  8:24 PM  Result Value Ref Range   Glucose-Capillary 223 (H) 70 - 99 mg/dL    Comment: Glucose reference range applies only to samples taken after fasting for at least 8  hours.  Glucose, capillary     Status: Abnormal   Collection Time: 11/10/23 11:55 PM  Result Value Ref Range   Glucose-Capillary 252 (H) 70 - 99 mg/dL    Comment: Glucose reference range applies only to samples taken after fasting for at least 8 hours.   Comment 1 Notify RN    Comment 2 Document in Chart   Glucose, capillary     Status: Abnormal   Collection Time: 11/11/23  3:41 AM  Result Value Ref Range   Glucose-Capillary 188 (H) 70 - 99 mg/dL    Comment: Glucose reference range applies only to samples taken after fasting for at least 8 hours.  Triglycerides     Status: Abnormal   Collection Time: 11/11/23  5:29 AM  Result Value Ref Range   Triglycerides 280 (H) <150 mg/dL    Comment: Performed at [facility], [address], [place], [place] [address]    Imaging / Studies: No results found.   Medications / Allergies: per chart  Antibiotics: Anti-infectives (From admission, onward)    Start     Dose/Rate Route Frequency Ordered Stop   11/09/23 1100  rifaximin (XIFAXAN) tablet 550 mg        550 mg Per Tube 2 times daily 11/09/23 1012     11/08/23 1815  piperacillin-tazobactam (ZOSYN) IVPB 3.375 g  Status:  Discontinued        3.375 g 12.5 mL/hr over 240 Minutes Intravenous Every 8 hours 11/08/23 1722 11/10/23 0945   11/08/23 1245  Ampicillin-Sulbactam (UNASYN) 3 g in sodium chloride 0.9 % 100 mL IVPB  Status:  Discontinued        3 g 200 mL/hr over 30 Minutes Intravenous Every 6 hours 11/08/23 1157 11/08/23 1717   11/05/23 0800  piperacillin-tazobactam (ZOSYN) IVPB 3.375 g  Status:  Discontinued        3.375 g 12.5 mL/hr over 240 Minutes Intravenous Every 8 hours  11/05/23 0747 11/08/23 1151   10/31/23 2000  ceFAZolin (ANCEF) IVPB 1 g/50 mL premix        1 g 100 mL/hr over 30 Minutes Intravenous Every 8 hours 10/31/23 1606 11/01/23 0449   10/31/23 0600  ceFAZolin (ANCEF) IVPB 2g/100 mL premix  Status:  Discontinued        2 g 200 mL/hr over 30 Minutes Intravenous On call to O.R. 10/31/23 0536 10/31/23 0538   10/31/23 0536  ceFAZolin (ANCEF) IVPB 2g/100 mL premix        2 g 200 mL/hr over 30 Minutes Intravenous 30 min pre-op 10/31/23 0536 10/31/23 1127         Note: Portions of this report may have been transcribed using voice recognition software. Every effort was made to ensure accuracy; however, inadvertent computerized transcription errors may be present.   Any transcriptional errors that result from this process are unintentional.    Eddye Goodie, MD, FACS, MASCRS Esophageal, Gastrointestinal & Colorectal Surgery Robotic and Minimally Invasive Surgery  [facility] [address] [place], [place] [address] [phone] Fax [phone] Main  CONTACT INFORMATION: Weekday (9AM-5PM): Call [facility] main office at [phone] Weeknight (5PM-9AM) or Weekend/Holiday: Check EPIC "Web Links" tab & use "AMION" (password " [email or web address]") for General Surgery [facility] coverage  Please, DO NOT use SecureChat  (it is not reliable communication to reach operating surgeons & will lead to a delay in care).   Epic staff messaging available for outptient concerns needing  1-2 business day response.      11/11/2023  6:55 AM

## 2023-11-11 NOTE — Progress Notes (Cosign Needed)
 Patient transported on ventilator to CT and back to 1225 with no complications. Vitals stable.

## 2023-11-12 DIAGNOSIS — R509 Fever, unspecified: Secondary | ICD-10-CM

## 2023-11-12 DIAGNOSIS — J9620 Acute and chronic respiratory failure, unspecified whether with hypoxia or hypercapnia: Secondary | ICD-10-CM | POA: Diagnosis not present

## 2023-11-12 DIAGNOSIS — D49511 Neoplasm of unspecified behavior of right kidney: Secondary | ICD-10-CM | POA: Diagnosis not present

## 2023-11-12 DIAGNOSIS — N179 Acute kidney failure, unspecified: Secondary | ICD-10-CM | POA: Diagnosis not present

## 2023-11-12 DIAGNOSIS — J441 Chronic obstructive pulmonary disease with (acute) exacerbation: Secondary | ICD-10-CM | POA: Diagnosis not present

## 2023-11-12 LAB — CBC
HCT: 33.4 % — ABNORMAL LOW (ref 39.0–52.0)
Hemoglobin: 9.8 g/dL — ABNORMAL LOW (ref 13.0–17.0)
MCH: 31.3 pg (ref 26.0–34.0)
MCHC: 29.3 g/dL — ABNORMAL LOW (ref 30.0–36.0)
MCV: 106.7 fL — ABNORMAL HIGH (ref 80.0–100.0)
Platelets: 312 10*3/uL (ref 150–400)
RBC: 3.13 MIL/uL — ABNORMAL LOW (ref 4.22–5.81)
RDW: 13.7 % (ref 11.5–15.5)
WBC: 14.9 10*3/uL — ABNORMAL HIGH (ref 4.0–10.5)
nRBC: 0 % (ref 0.0–0.2)

## 2023-11-12 LAB — GLUCOSE, CAPILLARY
Glucose-Capillary: 179 mg/dL — ABNORMAL HIGH (ref 70–99)
Glucose-Capillary: 185 mg/dL — ABNORMAL HIGH (ref 70–99)
Glucose-Capillary: 201 mg/dL — ABNORMAL HIGH (ref 70–99)
Glucose-Capillary: 210 mg/dL — ABNORMAL HIGH (ref 70–99)
Glucose-Capillary: 228 mg/dL — ABNORMAL HIGH (ref 70–99)
Glucose-Capillary: 244 mg/dL — ABNORMAL HIGH (ref 70–99)

## 2023-11-12 LAB — COMPREHENSIVE METABOLIC PANEL WITH GFR
ALT: 36 U/L (ref 0–44)
AST: 33 U/L (ref 15–41)
Albumin: 3 g/dL — ABNORMAL LOW (ref 3.5–5.0)
Alkaline Phosphatase: 46 U/L (ref 38–126)
Anion gap: 9 (ref 5–15)
BUN: 90 mg/dL — ABNORMAL HIGH (ref 6–20)
CO2: 27 mmol/L (ref 22–32)
Calcium: 8.7 mg/dL — ABNORMAL LOW (ref 8.9–10.3)
Chloride: 110 mmol/L (ref 98–111)
Creatinine, Ser: 1.4 mg/dL — ABNORMAL HIGH (ref 0.61–1.24)
GFR, Estimated: >60 mL/min (ref >60)
Glucose, Bld: 181 mg/dL — ABNORMAL HIGH (ref 70–99)
Potassium: 3.4 mmol/L — ABNORMAL LOW (ref 3.5–5.1)
Sodium: 146 mmol/L — ABNORMAL HIGH (ref 135–145)
Total Bilirubin: 0.6 mg/dL (ref 0.0–1.2)
Total Protein: 6.2 g/dL — ABNORMAL LOW (ref 6.5–8.1)

## 2023-11-12 LAB — BLOOD GAS, ARTERIAL
Acid-Base Excess: 4.5 mmol/L — ABNORMAL HIGH (ref 0.0–2.0)
Bicarbonate: 31.9 mmol/L — ABNORMAL HIGH (ref 20.0–28.0)
Drawn by: 31394
FIO2: 40 %
Mode: POSITIVE
O2 Saturation: 94.5 %
PEEP: 5 cmH2O
Patient temperature: 36.8
Pressure support: 5 cmH2O
pCO2 arterial: 61 mmHg — ABNORMAL HIGH (ref 32–48)
pH, Arterial: 7.32 — ABNORMAL LOW (ref 7.35–7.45)
pO2, Arterial: 63 mmHg — ABNORMAL LOW (ref 83–108)

## 2023-11-12 LAB — TRIGLYCERIDES: Triglycerides: 514 mg/dL — ABNORMAL HIGH (ref <150)

## 2023-11-12 LAB — PREALBUMIN: Prealbumin: 29 mg/dL (ref 18–38)

## 2023-11-12 LAB — PHOSPHORUS: Phosphorus: 3.6 mg/dL (ref 2.5–4.6)

## 2023-11-12 LAB — MAGNESIUM: Magnesium: 3.2 mg/dL — ABNORMAL HIGH (ref 1.7–2.4)

## 2023-11-12 MED ORDER — GLYCOPYRROLATE 0.2 MG/ML IJ SOLN
0.3000 mg | Freq: Four times a day (QID) | INTRAMUSCULAR | Status: DC
  Administered 2023-11-12 – 2023-11-14 (×10): 0.3 mg via INTRAVENOUS
  Filled 2023-11-12 (×10): qty 2

## 2023-11-12 MED ORDER — LACTULOSE 10 GM/15ML PO SOLN
30.0000 g | Freq: Three times a day (TID) | ORAL | Status: DC
  Administered 2023-11-12 (×2): 30 g via ORAL
  Filled 2023-11-12 (×2): qty 45

## 2023-11-12 MED ORDER — SODIUM CHLORIDE 0.9 % IV SOLN
1.0000 g | Freq: Three times a day (TID) | INTRAVENOUS | Status: DC
  Administered 2023-11-12: 1 g via INTRAVENOUS
  Filled 2023-11-12 (×3): qty 20

## 2023-11-12 MED ORDER — KETAMINE HCL-SODIUM CHLORIDE 1000-0.69 MG/100ML-% IV SOLN
0.5000 mg/kg/h | INTRAVENOUS | Status: DC
  Administered 2023-11-12 – 2023-11-14 (×4): 0.5 mg/kg/h via INTRAVENOUS
  Filled 2023-11-12 (×4): qty 100

## 2023-11-12 MED ORDER — SODIUM CHLORIDE 0.9 % IV SOLN: INTRAVENOUS | Status: AC

## 2023-11-12 MED ORDER — POLYETHYLENE GLYCOL 3350 17 G PO PACK
34.0000 g | PACK | Freq: Two times a day (BID) | ORAL | Status: DC
  Administered 2023-11-12: 34 g
  Filled 2023-11-12: qty 2

## 2023-11-12 MED ORDER — PROPOFOL BOLUS VIA INFUSION FOR PALLIATIVE SEDATION MD ADMINISTRATION
0.5000 mg/kg | Freq: Once | INTRAVENOUS | Status: AC
  Administered 2023-11-12: 63.6 mg via INTRAVENOUS
  Filled 2023-11-12: qty 64

## 2023-11-12 MED ORDER — CHLORHEXIDINE GLUCONATE CLOTH 2 % EX PADS
6.0000 | MEDICATED_PAD | Freq: Every day | CUTANEOUS | Status: DC
  Administered 2023-11-12: 6 via TOPICAL

## 2023-11-12 MED ORDER — POTASSIUM CHLORIDE 20 MEQ PO PACK
40.0000 meq | PACK | Freq: Two times a day (BID) | ORAL | Status: AC
  Administered 2023-11-12: 40 meq
  Filled 2023-11-12: qty 2

## 2023-11-12 NOTE — Progress Notes (Signed)
 eLink Physician-Brief Progress Note Patient Name: Preston [name] DOB: [birth year] MRN: [id number]   Date of Service  11/12/2023  HPI/Events of Note  Potassium 3.4, creat 1.40, GFR >60. Has og tube  eICU Interventions  Initiate electrolyte replacement protocol     Intervention Category Intermediate Interventions: Electrolyte abnormality - evaluation and management  Turner Gains 11/12/2023, 5:24 AM

## 2023-11-12 NOTE — Progress Notes (Signed)
 Palliative Care Progress Note  Patient seen and examined this AM. Reviewed MRI results and CT results- they were relatively unremarkably and provided minimal additional clear information about the source and etiology of his encephalopathy and persistent fever. He remains extremely agitated despite large amounts of sedation and pain control. He had a large bowel movement yesterday. His abdomen is softer. Copious oral and respiratory secretions.He opens eyes to verbal and physical stimulation but does not follow commands and has not made any improvement since being intubated and transferred to ICU 9 days ago.   Family feel that continuing with aggressive interventions and ongoing life support would be against the patients previously stated wishes.If he cannot recover his full function then they would like to allow for a natural death to occur from his underlying medical problems. He has  a large cirrhotic liver from years of ETOH use disorder, evidence of hepatic encephalopathy which has not improved with treating electrolyte abnormalities, rifaximin, bowel regimen or holding benzos.  I do not think after 10 days of progressive hepatic encephalopathy, hypoxia and fever that he will have a meaningful recovery.  Family called Palliative Care this afternoon requesting compassionate extubation this evening. They have met as a family and decided that they do not wish to continue current interventions, even though the evening. All family has come in to see him and they report being ready to remove mechanical ventilation. I discussed expectation with them. This is a comfort care transition, we will not withhold helpful medications or medications that contribute to his comfort. He is currently on Propofol, Ketamine and Fentanyl-and still responding to voice and also appears agitated. I will be present at bedside to provide adjustment to his medication based on symptoms and need along with ICU RN.  Family wish to  proceed. I offered discussion with other providers on the team, but they feel their questions have been answered and they are at peace with the decision to transition to comfort care.  Flora Humphreys, DO Palliative Medicine

## 2023-11-12 NOTE — Plan of Care (Signed)
  Problem: Clinical Measurements: Goal: Respiratory complications will improve Outcome: Not Progressing   Problem: Activity: Goal: Risk for activity intolerance will decrease Outcome: Not Progressing   Problem: Elimination: Goal: Will not experience complications related to bowel motility Outcome: Not Progressing   Problem: Coping: Goal: Ability to adjust to condition or change in health will improve Outcome: Not Progressing   Problem: Bowel/Gastric: Goal: Gastrointestinal status for postoperative course will improve Outcome: Not Progressing   Problem: Clinical Measurements: Goal: Postoperative complications will be avoided or minimized Outcome: Not Progressing

## 2023-11-12 NOTE — Progress Notes (Signed)
 PHARMACY NOTE:  ANTIMICROBIAL RENAL DOSAGE ADJUSTMENT  Current antimicrobial regimen includes a mismatch between antimicrobial dosage and estimated renal function.  As per policy approved by the Pharmacy & Therapeutics and Medical Executive Committees, the antimicrobial dosage will be adjusted accordingly.  Current antimicrobial dosage:  meropenem 500mg  IV q8  Indication: IAI  Renal Function:  Estimated Creatinine Clearance: 89.7 mL/min (A) (by C-G formula based on SCr of 1.4 mg/dL (H)). []      On intermittent HD, scheduled: []      On CRRT    Antimicrobial dosage has been changed to:  meropenem 1g IV q8  Additional comments:   Thank you for allowing pharmacy to be a part of this patient's care.  Bernett Brill, [facility] 11/12/2023 7:55 AM

## 2023-11-12 NOTE — Progress Notes (Signed)
 Patient ID: Preston [name], male   DOB: 03-01-1971, 53 y.o.   MRN: [id number]  12 Days Post-Op Subjective: Pt remains intubated and sedated.  Objective: Vital signs in last 24 hours: Temp:  [98.6 F (37 C)-99.8 F (37.7 C)] 99.2 F (37.3 C) (04/15 0400) Pulse Rate:  [66-102] 73 (04/15 0530) Resp:  [14-22] 22 (04/15 0530) BP: (102-175)/(42-81) 121/57 (04/15 0530) SpO2:  [92 %-100 %] 96 % (04/15 0530) FiO2 (%):  [40 %] 40 % (04/15 0401) Weight:  [127.2 kg] 127.2 kg (04/15 0420)  Intake/Output from previous day: 04/14 0701 - 04/15 0700 In: 4331.4 [I.V.:1457.6; NG/GT:1986.7; IV Piggyback:887.1] Out: 1775 [Urine:1375; Emesis/NG output:400] Intake/Output this shift: No intake/output data recorded.   Lab Results: Recent Labs    11/10/23 0251 11/12/23 0245  HGB 11.4* 9.8*  HCT 36.8* 33.4*      Latest Ref Rng & Units 11/12/2023    2:45 AM 11/10/2023    2:51 AM 11/08/2023    3:02 AM  CBC  WBC 4.0 - 10.5 K/uL 14.9  18.0  15.2   Hemoglobin 13.0 - 17.0 g/dL 9.8  95.1  88.4   Hematocrit 39.0 - 52.0 % 33.4  36.8  35.3   Platelets 150 - 400 K/uL 312  256  315       BMET Recent Labs    11/10/23 0251 11/12/23 0051  NA 144 146*  K 4.4 3.4*  CL 107 110  CO2 28 27  GLUCOSE 238* 181*  BUN 88* 90*  CREATININE 1.57* 1.40*  CALCIUM 8.5* 8.7*     Studies/Results: CT ABDOMEN PELVIS W CONTRAST Result Date: 11/11/2023 CLINICAL DATA:  Fever and leukocytosis with recent surgery. EXAM: CT ABDOMEN AND PELVIS WITH CONTRAST TECHNIQUE: Multidetector CT imaging of the abdomen and pelvis was performed using the standard protocol following bolus administration of intravenous contrast. RADIATION DOSE REDUCTION: This exam was performed according to the departmental dose-optimization program which includes automated exposure control, adjustment of the mA and/or kV according to patient size and/or use of iterative reconstruction technique. CONTRAST:  OMNIPAQUE IOHEXOL 300 MG/ML  SOLN  COMPARISON:  CT abdomen and pelvis 11/08/2018 FINDINGS: Lower chest: There is patchy airspace consolidation in the lung bases, right greater than left, which has increased. Hepatobiliary: Mildly enlarged. No focal liver abnormality is seen. No gallstones, gallbladder wall thickening, or biliary dilatation. Pancreas: Unremarkable. No pancreatic ductal dilatation or surrounding inflammatory changes. Spleen: Mildly enlarged. Adrenals/Urinary Tract: There is a low-density indeterminate right adrenal nodule which is unchanged measuring 17 mm. The left adrenal gland is within normal limits. Bladder and distal in ureters are not well seen secondary to streak artifact in the pelvis. There is no hydronephrosis. There is mild nonspecific bilateral perinephric fat stranding. Right renal hypodensities too small to characterize, but likely cysts measuring up to 19 mm. There is likely hyperdense cyst in the superior pole left kidney measuring 17 mm, also unchanged. Stomach/Bowel: No evidence of bowel wall thickening, distention, or inflammatory changes. The appendix is not seen. There are air-fluid levels scattered throughout the colon. Nasogastric tube tip is in the body of the stomach. The stomach is nondilated. Vascular/Lymphatic: Aortic atherosclerosis. No enlarged abdominal or pelvic lymph nodes. Reproductive: Not well evaluated secondary to streak artifact in the pelvis. Other: There is no ascites. Lobulated subcutaneous fluid collection is seen in the anterior abdominal wall measuring 8.7 x 3.1 x 11.8 cm similar in size when compared to prior. Air has resolved within the fluid collection. Small amount of subcutaneous  air is present, but has also decreased. There is no abdominal wall hernia. There is skin thickening, subcutaneous edema and rounded subcutaneous nodule/cyst lateral to the right hip which is unchanged. Musculoskeletal: Bilateral hip arthroplasties are present. No acute fractures are seen. Degenerative changes  affect the spine. IMPRESSION: 1. Stable size of the lobulated subcutaneous fluid collection in the anterior abdominal wall. Air has resolved within the fluid collection. Abscess is not excluded in the appropriate clinical setting. 2. Air-fluid levels scattered throughout the colon compatible with diarrheal illness. 3. Increasing patchy airspace consolidation in the lung bases, right greater than left, worrisome for pneumonia. 4. Stable indeterminate right adrenal nodule. This can be further evaluated with adrenal protocol CT or MRI. 5. Stable mild hepatosplenomegaly. 6. Stable skin thickening, subcutaneous edema and rounded subcutaneous nodule/cyst lateral to the right hip. 7. Aortic atherosclerosis. Electronically Signed   By: Tyron Gallon M.D.   On: 11/11/2023 20:37   MR BRAIN WO CONTRAST Result Date: 11/11/2023 CLINICAL DATA:  Mental status change, unknown cause EXAM: MRI HEAD WITHOUT CONTRAST TECHNIQUE: Multiplanar, multiecho pulse sequences of the brain and surrounding structures were obtained without intravenous contrast. COMPARISON:  CT head November 08, 2023. FINDINGS: Brain: No acute infarction, hemorrhage, hydrocephalus, extra-axial collection or mass lesion. Mild scattered T2/FLAIR hyperintensities in the white matter nonspecific but compatible with chronic microvascular ischemic disease. Vascular: Major arterial flow voids are maintained at the skull base. Skull and upper cervical spine: Normal marrow signal. Sinuses/Orbits: Mild paranasal sinus mucosal thickening. No acute orbital findings. Other: Small mastoid effusions. IMPRESSION: No evidence of acute intracranial abnormality. Electronically Signed   By: Stevenson Elbe M.D.   On: 11/11/2023 20:08   DG CHEST PORT 1 VIEW Result Date: 11/11/2023 CLINICAL DATA:  Shortness of breath EXAM: PORTABLE CHEST 1 VIEW COMPARISON:  Chest radiograph dated 11/06/2023 FINDINGS: Lines/tubes: Endotracheal tube tip projects 4.8 cm above the carina. Enteric tube  tip reaches the diaphragm and terminates below the field of view. Partially imaged metal tip probe projects over the midline neck. Lungs: Low lung volumes with bronchovascular crowding. Bibasilar hazy and patchy opacities. Diffuse interstitial opacities. Pleura: Blunting of the right costophrenic angle. No definite pneumothorax. Heart/mediastinum: The heart size and mediastinal contours are within normal limits. Bones: No acute osseous abnormality. IMPRESSION: 1. Endotracheal tube tip projects 4.8 cm above the carina. 2. Bibasilar hazy and patchy opacities, which may represent atelectasis, aspiration, or pneumonia. 3. Diffuse interstitial opacities, which may represent pulmonary edema. 4. Blunting of the right costophrenic angle, which may represent a small pleural effusion. Electronically Signed   By: Limin  Xu M.D.   On: 11/11/2023 14:39    Assessment/Plan: POD # 12 s/p right RAL partial nephrectomy and incisional hernia repair with mesh - Appreciate pulmonary/critical care management.  Remains intubated.  MRI of brain without acute process.  CT abdomen performed yesterday and reviewed.  Subcutaneous fluid collection.  Would presume this is resolving hematoma with post-op abdominal ecchymosis.  Do not see developing signs of abscess or infection. No intra-abdominal source of infection.  Renal function is stable.     LOS: 10 days   Kristeen Peto 11/12/2023, 7:28 AM

## 2023-11-12 NOTE — Progress Notes (Signed)
   11/12/23 1545  Spiritual Encounters  Type of Visit Initial  Care provided to: Friend;Family  Conversation partners present during encounter Nurse  Reason for visit Routine spiritual support  OnCall Visit No   Visited with patient. Family and friends at bedside. Daughter stepped out. Spiritual care provided. Conversation with nurse. Exacerbation to comfort care scheduled for morning.

## 2023-11-12 NOTE — Progress Notes (Signed)
 Patient extubated to comfort-he did not require any additional boluses of medication at time of tube removal. Patient tolerated tube removal well. I was able to discontinue his propofol infusion and he required no physician administered bolus. He appears very comfortable, oxygen sats are 97% after tube removal, he is not agitated, strong cough reflex. If patient remains stable through the evening we can address resuming meds, antibiotics and other treatment oriented care. I shared uncertainty of trajectory with family, but primary goal of extubation was comfort care. For now maintain fentanyl and ketamine. If he shows signs of increased WOB, RR or distress can resume low dose propofol infusion.  Flora Humphreys, DO Palliative Medicine

## 2023-11-12 NOTE — Progress Notes (Signed)
 Extubated pt to 2 lpm nasal cannula with MD and family at bedside.

## 2023-11-12 NOTE — Procedures (Signed)
 Extubation Procedure Note  Patient Details:   Name: Preston [name] DOB: 12-06-1970 MRN: [id number]   Airway Documentation:  Airway 8 mm (Active)  Secured at (cm) 27 cm 11/12/23 2035  Measured From Lips 11/12/23 2035  Secured Location Center 11/12/23 2035  Secured By [employer] 11/12/23 2035  Bite Block No 11/12/23 2035  Tube Holder Repositioned Yes 11/12/23 2035  Prone position No 11/12/23 2035  Head position Right 11/12/23 2035  Cuff Pressure (cm H2O) Clear OR 27-39 CmH2O 11/12/23 2035  Site Condition Dry 11/12/23 2035   Vent end date: (not recorded) Vent end time: (not recorded)   Evaluation  O2 sats: currently acceptable Complications: No apparent complications Patient did tolerate procedure well. Bilateral Breath Sounds: Diminished   No  Reta Cassis [facility] 11/12/2023, 11:05 PM

## 2023-11-12 NOTE — Progress Notes (Addendum)
 NAME:  Preston [name], MRN:  [id number], DOB:  05/01/1971, LOS: 10 ADMISSION DATE:  10/31/2023, CONSULTATION DATE:  11/04/2023 REFERRING MD:  Dr. Lanae Boast - [facility], CHIEF COMPLAINT:  Hypoxia and concern for alcohol    History of Present Illness:  Preston [name] is a 53 y.o. with a past medical history significant for left partial nephrectomy for renal cell carcinoma, abdominal incisional hernia, TN, HLD, CHF, COPD, and GERD who presented for elective abdominal incision hernia repair with combination right robotic assisted laparoscopic partial nephrectomy.  Per chart review it appears patient tolerated procedures well.    Postop day 1 patient was seen with mild hypoxia with SpO2 84% on room air this was felt due to splinting secondary to pain and pulmonary hygiene was encouraged.  Hospitalist service was consulted 4/5 for persistent hypoxemia.   By 4/7 hypoxemia continued to persistent with AMS as well, concerning for ETOH withdrawal, PCCM consulted   Pertinent  Medical History  Left partial nephrectomy for renal cell carcinoma, abdominal incisional hernia, TN, HLD, CHF, COPD, and GERD  Significant Hospital Events: Including procedures, antibiotic start and stop dates in addition to other pertinent events   4/3 admitted for elective hernia repair and partial right nephrectomy  4/4 hypoxic on RA  4/5 [facility] consulted  4/7 Signs of alcohol withdrawal and AMS, intubated overnight for increased work of breathing 4/8 high Fio2/PEEP needs. PNA vs edema. Met ARDS criteria. Zosyn started and diuresed.  4/9: weaned to minimal support, became hypoxic and back to 50% and 10 PEEP 4/11 unresponsive on ventilator this a.m with upward forced gaze., continuous sedation stopped.  Within about an hour patient began spontaneously moving during deep suctioning but upward forced gaze persist.  Head CT and Spot EEG pending 4/14 discussion with spouse and daughter at bedside-clarification that he will not want to be kept  on artificial support long-term, will not want a tracheostomy 4/14-MRI brain with no acute finding, CT abdomen with contrast with what appears to be resolving hematoma, no signs of abscess or infection  Interim History / Subjective:  Sedated Remains non redirectable  Objective   Blood pressure (!) 148/73, pulse 87, temperature 99.2 F (37.3 C), temperature source Esophageal, resp. rate 16, height 6\' 3"  (1.905 m), weight 127.2 kg, SpO2 100%.    Vent Mode: PRVC FiO2 (%):  [40 %] 40 % Set Rate:  [22 bmp] 22 bmp Vt Set:  [500 mL] 500 mL PEEP:  [5 cmH20] 5 cmH20 Plateau Pressure:  [15 cmH20-18 cmH20] 16 cmH20   Intake/Output Summary (Last 24 hours) at 11/12/2023 0819 Last data filed at 11/12/2023 0800 Gross per 24 hour  Intake 4544.75 ml  Output 1675 ml  Net 2869.75 ml   Filed Weights   11/10/23 0430 11/11/23 0702 11/12/23 0420  Weight: 121 kg 129.7 kg 127.2 kg    Examination:  Gen:      Sedated, acutely ill-appearing HEENT:  Endotracheal tube in place, moist oral mucosa lungs:    Decreased breath sounds bilaterally CV:         S1-S2 appreciated, no murmur Abd:      Bowel sounds appreciated Ext:   No edema Skin:      Warm and dry; no rashes Neuro:   Not following commands, moving all extremities  I reviewed nursing notes, Consultant notes, hospitalist notes, last 24 h vitals and pain scores, last 48 h intake and output, last 24 h labs and trends, and last 24 h imaging results. Reviewed results  of MRI, reviewed results of abdominal CT Reviewed urology notes Discussed with Dr. Dane Dung Surgery  White count 14.9, down from [facility] Problem list     Assessment & Plan:   Acute on chronic respiratory failure COPD with exacerbation Aspiration pneumonia versus lung injury Was an active tobacco user - Continue mechanical ventilation -Target TVol 6-8cc/kgIBW -Target Plateau Pressure < 30cm H20 -Target driving pressure less than 15 cm of water -Target  PaO2 55-65: titrate PEEP/FiO2 per protocol -Ventilator associated pneumonia prevention protocol - Continue Brovana, Yupelri, Pulmicort -Completed course of antibiotics and steroids  Right renal mass status post partial nephrectomy-surgical pathology with renal papillary adenoma - CT abdomen repeated showing no evidence of acute infection, some postop hematoma/seroma - Will continue to monitor  Acute kidney injury - Avoid nephrotoxic medications - Continue to maintain renal perfusion  Chronic diastolic congestive heart failure with ejection fraction of 50-55% - Continue statin - Diuretics held  Type 2 diabetes with steroid-induced hyperglycemia - Continue SSI  Constipation - Did have a bowel motion  Persistent encephalopathy Elevated ammonia - On Xifaxan - Add lactulose  With persistent fevers No fever in last 24 hours - Cultures sent 11/11/2023 -CT scan with bibasilar infiltrates - Initiated on linezolid and meropenem 4/14 - Will continue for today  GERD - Continue PPI  Discussion with family at bedside 11/11/2023 - Will not want to be on a ventilator long-term, does not want a tracheostomy - Timeline for turnaround remains difficult to predict - Will continue to try to wean down medications that may be sedating - MRI head negative  Appreciate palliative care involvement and management  For sedation, will need to transition off propofol - Elevated triglyceride - Reinitiate Precedex - As needed Versed - On Neurontin 400 every 8 - Duloxetine 60   Best Practice (right click and "Reselect all SmartList Selections" daily)   Diet/type: NPO tube feeds DVT prophylaxis LMWH Pressure ulcer(s): N/A GI prophylaxis: PPI Lines: N/A Foley:  Yes, and it is still needed Code Status:  full code Last date of multidisciplinary goals of care discussion: Discussed with patient's daughter and spouse at bedside 4/14   Critical care time:    The patient is critically ill with  multiple organ systems failure and requires high complexity decision making for assessment and support, frequent evaluation and titration of therapies, application of advanced monitoring technologies and extensive interpretation of multiple databases. Critical Care Time devoted to patient care services described in this note independent of APP/resident time (if applicable)  is 35 minutes.   Myer Artis MD [facility] Personal pager: See Amion If unanswered, please page CCM On-call: #[phone]

## 2023-11-12 NOTE — Plan of Care (Signed)
 MRI reviewed, negative  Plan as per Dr. Alecia Ames earlier  Primary team to reach out if further questions for neurology arise, discussed with team via secure chat  Baldwin Levee MD-PhD [facility] [phone]

## 2023-11-12 NOTE — Progress Notes (Signed)
 Chaplain attempted to visit, pt not alert, no family present. Chaplain team will follow up at another time.  [address], [place]  11/12/23 1200  Spiritual Encounters  Type of Visit Attempt (pt unavailable)

## 2023-11-12 NOTE — Progress Notes (Addendum)
 11/12/2023  Preston [name] [id number] 1971/05/13  CARE TEAM: PCP: Claiborne Rigg, NP  Outpatient Care Team: Patient Care Team: Claiborne Rigg, NP as PCP - General (Nurse Practitioner) Jake Bathe, MD as PCP - Cardiology (Cardiology) Willis Modena, MD as Consulting Physician (Gastroenterology) Karie Soda, MD as Consulting Physician (General Surgery) Heloise Purpura, MD as Consulting Physician (Urology)  Inpatient Treatment Team: Treatment Team:  Heloise Purpura, MD Karie Soda, MD Harrie Foreman, [place], MD Lanae Boast, MD Pccm, Md, MD Kym Groom, MD Edsel Petrin, DO Hessie Knows, RPH [place], Lossie Faes, NT Julio Alm, RN Nathanial Rancher, RN   Problem List:   Principal Problem:   Neoplasm of right kidney Active Problems:   Tobacco abuse   HLD (hyperlipidemia)   Alcohol abuse   Chronic diastolic congestive heart failure (HCC)   Gastroesophageal reflux disease without esophagitis   Type 2 diabetes mellitus treated without insulin (HCC)   Essential hypertension   Acute kidney injury (HCC)   Obesity, Class II, BMI 35-39.9   Diastasis recti   Incarcerated incisional hernia s/p repair w mesh 10/31/2023   History of Left Renal cell cancer   Hyponatremia   Acute hypoxemic respiratory failure (HCC)   Alcohol withdrawal delirium (HCC)   ABLA (acute blood loss anemia)   Primary hypertension   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  Swiss cheese hernias going down infraumbilically connecting with his  prior low midline incision as well.  Umbilical hernia.   Type of ventral wall repair:  Laparoscopic underlay repair .with Primary repair of largest hernia Placement of mesh: Centrally intraperitoneal with edges tucked into RECTRORECTUS & preperitoneal space Name of mesh: Bard Ventralight dual sided (polypropylene / Seprafilm) Size of mesh: 33x27cm Orientation: Vertical Mesh overlap:  5-7cm  10/31/2023   Postoperative diagnosis: Right renal neoplasm   Procedure:   Right robotic-assisted laparoscopic partial nephrectomy Intraoperative renal ultrasonography   Surgeon: Moody Bruins. M.D.   Intraoperative findings:       1. Warm renal ischemia time: 22 minutes       2. Intraoperative renal ultrasound findings: There was a small cystic appearing mass that measured about 1 cm adjacent to a larger renal cyst.   Drains: # 15 Blake perinephric drain  FINAL MICROSCOPIC DIAGNOSIS:  A. 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 PERINEPHRIC FAT, EXCISION: Benign fibroadipose tissue  C. HERNIA SAC: Benign fibromuscular and adipose tissue     Assessment [facility] Stay = 10 days) 12 Days Post-Op    Worsening delirium and confusion and hypoxia most likely due to alcohol withdrawal & encephalopathy more prevalent  Plan:  Main issue seems to be confusion and agitation and oversedation.    Numerous regimens have been adjusted.  I had extensive discussion with pulmonary critical care, Dr. Wynona Neat, ICU nursing, and palliative care Dr. Phillips Odor this morning  Patient seems more alert and active today though still not consistently following commands.  No focal deficits.  Challenged that he gets quickly agitated and pulls at lines and tubes needing heavier sedation.  Hopefully will be easier to manage further out.  I agree with critical care and being aggressive in giving this more time  since there is no strong evidence that he will never recover.  CT MRI EEG is negative for any strong evidence of stroke or seizure activity.  Will defer to critical care and neurology.  Adjust regimen to carefully sedate him on the vent and allow his alcohol and other substance withdrawals to proceed safely.  Challenging to thread the needle  Ventilatory support.  Defer to extubation to pulmonary critical care.  Vent settings minimal secretions less and encouraging sign.  CT scan raise concerns of pulmonary consolidation but tracheal aspirate negative and minimal vent settings.  Defer to critical care if they wish to more aggressively treat a possible pneumonia her current antibiotic regimen this hospitalization has been adequate postop unfortunately I think this may be a situation where we will need to extubate and see how he flies.  A safer plan would be tracheostomy and trach collar let him slowly wean and wake up but sounds like family is very much against this at this point.  Will see what critical care and palliative care think.    Concern hepatic insufficiency not surprising with his heavy alcohol use.  LFTs not severe.  Ammonia only mildly elevated.  Getting Xifixan.  I think critical care is talking about adding lactulose which should help with constipation.  Pain control per critical care.  Challenging in this obese male with chronic pain issues.  Multimodal home regimen Cymbalta, BuSpar, and gabapentin mostly back on.  Fentanyl gtt no evidence of secondary metabolites causing problems - defer to CCM/Neurology  Afebrile and leukocytosis resolving encouraging there is no evidence of active infection.  CT scan chest argues otherwise.  Critical care starting linezolid and meropenem 4/14- for possible PNA.  Postoperative abdominal wall hematoma/seroma not surprising.  Area is soft and flat with no cellulitis abscess or drainage.  CT scan done yesterday notes less gas and not worsening.  No  enhancement nor abscess formation.  Consistent with healing.  No fevers off antibiotics.  Leave it alone.  Concern for severe constipation.  That is not what I have seen.  Got an enema with results.  Continue fiber and MiraLAX and rectal suppositories.  Will increase MiraLAX.  Full enteral nutrition through tube feeds.  Rate cut down a little bit since he is on propofol as well.  Concern of hypertriglyceridemia = may be coming off propofol and back on Precedex.  Critical care & nutrition help to lead to make sure he is not malnourished.  Prealbumin 29 and albumin 3 this week not too bad in the setting, arguing against malnutrition.  Pathology on renal mass benign renal papillary adenoma.  Reassuring.  Foley catheter for now.  Hopefully can remove once extubated.    monitor electrolytes & replace as needed  Keep K>4, Mg>2, Phos>3  -VTE prophylaxis- SCDs.  Anticoagulation prophyllaxis SQ as appropriate.  Okay to fully anticoagulate if there are concerns  I updated the patient's status to the patient's nurse  Recommendations were made.  Questions were answered.  She expressed understanding & appreciation.  -Disposition: TBD    I reviewed nursing notes, last 24 h vitals and pain scores, last 48 h intake and output, last 24 h labs and trends, and last 24 h imaging results.  I have reviewed  this patient's available data, including medical history, events of note, test results, etc as part of my evaluation.   A significant portion of that time was spent in counseling. Care during the described time interval was provided by me.  This care required moderate level of medical decision making.  11/12/2023    Subjective: (Chief complaint)  For constipation.  Got enema.  Bowel movements last night.  On minimal vent settings.  Switch to fentanyl and propofol only.  Moving around much more today but not consistently following commands.  ICU nurse in room  Critical care attending just  outside.  Objective:  Vital signs:  Vitals:   11/12/23 0500 11/12/23 0530 11/12/23 0700 11/12/23 0800  BP: (!) 120/53 (!) 121/57 126/65 (!) 148/73  Pulse: 76 73 77 87  Resp: 19 (!) 22 (!) 21 16  Temp:      TempSrc:      SpO2: 96% 96% 96% 100%  Weight:      Height:        Last BM Date : 11/12/23  Intake/Output   Yesterday:  04/14 0701 - 04/15 0700 In: 4331.4 [I.V.:1457.6; NG/GT:1986.7; IV Piggyback:887.1] Out: 1775 [Urine:1375; Emesis/NG output:400] This shift:  Total I/O In: 422.1 [I.V.:209; NG/GT:116; IV Piggyback:97.1] Out: -   Bowel function:  Flatus: YES  BM:  YES  Drain: (No drain)   Physical Exam: Clinical ICU nurse team & Dr Rozanne Corners in room with me  General: Pt resting intubated and sedated.  no acute distress.  Occasionally shaking his head and moving a little bit. Eyes: PERRL  Sclera clear.  No icterus Neuro: No focal sensory/motor deficits.  Not consistently following commands but moving hands and toes intermittently Lymph: No head/neck/groin lymphadenopathy Psych: Sedated at this point.  No more agitation/delirium  HENT: Normocephalic, Mucus membranes moist.  No thrush.  Orogastric tube just placed. Neck: Supple, No tracheal deviation.  No obvious thyromegaly Chest: No pain to chest wall compression.  Good respiratory excursion.  No audible wheezing but using abdominal wall to breathe.  Minimal conversational dyspnea. CV:  Pulses intact.  Regular rhythm.  No major extremity edema MS: Normal AROM mjr joints.  No obvious deformity  Abdomen: Obese Soft.  Nondistended.  Nontender.  Ecchymosis abdominal stable and slowly resolving.   Incisions clean dry intact.  No cellulitis nor drainage.  No fluctuance.  No guarding or peritonitis.  Argues against infection.   Ext:   No deformity.  No mjr edema.  No cyanosis Skin: No petechiae / purpurea.  No major sores.  Warm and dry    Results:    SURGICAL PATHOLOGY CASE: [id number] PATIENT: Preston  [name] Surgical Pathology Report     Clinical History: Right renal mass, ventral incisional incarcerated hernia (jlr)     FINAL MICROSCOPIC DIAGNOSIS:  A. 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 PERINEPHRIC FAT, EXCISION: Benign fibroadipose tissue  C. HERNIA SAC: Benign fibromuscular and adipose tissue     Meryn Sarracino DESCRIPTION:  A: Specimen is received fresh and consists of a 4.7 x 2.8 x 1.7 cm piece of tan soft tissue, with a small amount of overlying tan-yellow adipose tissue.  Resection margin is inked black, specimen is serially sectioned reveal tan parenchyma.  There is a 1.0 x 1.0 x 0.9 cm tan-white, solid, well-circumscribed lesion identified.  Lesion abuts the outer capsule, and measures 0.2 cm to the inked resection margin.  A  1.1 x 1.0 x 0.9 cm cortical cyst is identified.  No other lesions are identified. Representative sections are submitted in 5 cassettes. 1-3 = tan-white lesion 4 and 5 = cortical cyst  B: Specimen is received fresh, and consists of a 4.1 x 2.8 x 1.3 cm portion of tan-yellow adipose tissue.  Sectioning reveals grossly unremarkable cut surfaces.  Representative sections are submitted in 1 cassette.  C: Specimen is received fresh and consists of an 8.0 x 7.1 x 2.3 cm aggregate of tan-pink fibromembranous tissue and attached adipose tissue.  Sectioning reveals focally fibrotic cut surfaces. Representative sections are submitted in 3 cassettes.  Jeffrey Mini 11/01/2023)   Final Diagnosis performed by Zhaoli Lane, MD.   Electronically signed 11/04/2023 Technical and / or Professional components performed at [facility], [address] [place], [place] [address].  Immunohistochemistry Technical component (if applicable) was performed at [facility]. [address], [place], [place] [address].    IMMUNOHISTOCHEMISTRY DISCLAIMER (if applicable): Some of these immunohistochemical stains may have been developed and the performance characteristics determine by [facility]. Some may not have been cleared or approved by the U.S. Food and Drug Administration. The FDA has determined that such clearance or approval is not necessary. This test is used for clinical purposes. It should not be regarded as investigational or for research. This laboratory is certified under the Clinical Laboratory Improvement Amendments of 1988 (CLIA-88) as qualified to perform high complexity clinical laboratory testing.  The controls stained appropriately.   IHC stains are performed on formalin fixed, paraffin embedded tissue using a 3,3"diaminobenzidine (DAB) chromogen and Leica Bond Autostainer System. The staining intensity of the nucleus is score manually and is reported as the percentage of tumor cell nuclei demonstrating specific nuclear staining. The specimens are fixed in 10% Neutral Formalin for at least 6 hours and up to 72hrs. These tests are validated on decalcified tissue. Results should be interpreted with caution given the possibility of false negative results on decalcified specimens. Antibody Clones are as follows ER-clone 64F, PR-clone 16, Ki67- clone MM1. Some of these immunohistochemical stains may have been developed and the performance characteristics determined by [facility].    Cultures: Recent Results (from the past 720 hours)  MRSA Next Gen by PCR, Nasal     Status: None   Collection Time: 11/04/23  6:08 PM   Specimen: Nasal Mucosa; Nasal Swab  Result Value Ref Range Status   MRSA by PCR Next Gen NOT DETECTED NOT DETECTED Final    Comment: (NOTE) The GeneXpert MRSA Assay (FDA approved for NASAL specimens only), is one component of a comprehensive MRSA colonization surveillance program. It is not intended to diagnose MRSA infection nor to guide or monitor  treatment for MRSA infections. Test performance is not FDA approved in patients less than 6 years old. Performed at [facility], [address], [place], [place] [address]   Culture, blood (Routine X 2) w Reflex to ID Panel     Status: None   Collection Time: 11/05/23  8:22 AM   Specimen: BLOOD  Result Value Ref Range Status   Specimen Description   Final    BLOOD BLOOD RIGHT ARM AEROBIC BOTTLE ONLY ANAEROBIC BOTTLE ONLY Performed at [facility], [address], [place], [place] [address]    Special Requests   Final    BOTTLES DRAWN AEROBIC AND ANAEROBIC Blood Culture results may not be optimal due to an inadequate volume of blood  received in culture bottles Performed at [facility], [address], [place], [place] [address]    Culture   Final    NO GROWTH 5 DAYS Performed at [facility], [address], [place], [place] [address]    Report Status 11/10/2023 FINAL  Final  Culture, blood (Routine X 2) w Reflex to ID Panel     Status: None   Collection Time: 11/05/23  8:22 AM   Specimen: BLOOD  Result Value Ref Range Status   Specimen Description   Final    BLOOD BLOOD RIGHT HAND AEROBIC BOTTLE ONLY ANAEROBIC BOTTLE ONLY Performed at [facility], [address] [place], [place] [address]    Special Requests   Final    BOTTLES DRAWN AEROBIC AND ANAEROBIC Blood Culture adequate volume Performed at [facility], [address] [place], [place] [address]    Culture   Final    NO GROWTH 5 DAYS Performed at [facility], [address] [place], [place] [address]    Report Status 11/10/2023 FINAL  Final  Culture, Respiratory w Gram Stain     Status: None   Collection Time: 11/05/23  9:56 AM   Specimen: SPU; Respiratory  Result Value Ref Range Status   Specimen Description   Final    SPUTUM Performed at [facility], [address],  [place], [place] [address]    Special Requests   Final    SPUTUM Performed at [facility], [address], [place], [place] [address]    Gram Stain   Final    NO WBC SEEN FEW GRAM POSITIVE COCCI IN PAIRS IN CHAINS    Culture   Final    RARE Normal respiratory flora-no Staph aureus or Pseudomonas seen Performed at [facility], [address], [place], [place] [address]    Report Status 11/07/2023 FINAL  Final  Culture, blood (Routine X 2) w Reflex to ID Panel     Status: None (Preliminary result)   Collection Time: 11/11/23  4:48 PM   Specimen: BLOOD LEFT HAND  Result Value Ref Range Status   Specimen Description   Final    BLOOD LEFT HAND Performed at [facility], [address], [place], [place] [address]    Special Requests   Final    BOTTLES DRAWN AEROBIC AND ANAEROBIC Blood Culture results may not be optimal due to an inadequate volume of blood received in culture bottles Performed at [facility], [address] [place], [place] [address]    Culture PENDING  Incomplete   Report Status PENDING  Incomplete  Culture, blood (Routine X 2) w Reflex to ID Panel     Status: None (Preliminary result)   Collection Time: 11/11/23  4:49 PM   Specimen: BLOOD RIGHT HAND  Result Value Ref Range Status   Specimen Description   Final    BLOOD RIGHT HAND Performed at [facility], [address] [place], [place] [address]    Special Requests   Final    BOTTLES DRAWN AEROBIC AND ANAEROBIC Blood Culture adequate volume Performed at [facility], [address], [place], [place] [address]    Culture PENDING  Incomplete   Report Status PENDING  Incomplete    Labs: Results for orders placed or performed during the hospital encounter of 10/31/23 (from the past 48 hours)  Glucose, capillary  Status: Abnormal   Collection Time: 11/10/23 11:32 AM  Result Value Ref Range   Glucose-Capillary 258 (H) 70 - 99 mg/dL     Comment: Glucose reference range applies only to samples taken after fasting for at least 8 hours.  Glucose, capillary     Status: Abnormal   Collection Time: 11/10/23  3:49 PM  Result Value Ref Range   Glucose-Capillary 300 (H) 70 - 99 mg/dL    Comment: Glucose reference range applies only to samples taken after fasting for at least 8 hours.  Glucose, capillary     Status: Abnormal   Collection Time: 11/10/23  7:48 PM  Result Value Ref Range   Glucose-Capillary 220 (H) 70 - 99 mg/dL    Comment: Glucose reference range applies only to samples taken after fasting for at least 8 hours.   Comment 1 Notify RN    Comment 2 Document in Chart   Glucose, capillary     Status: Abnormal   Collection Time: 11/10/23  8:24 PM  Result Value Ref Range   Glucose-Capillary 223 (H) 70 - 99 mg/dL    Comment: Glucose reference range applies only to samples taken after fasting for at least 8 hours.  Glucose, capillary     Status: Abnormal   Collection Time: 11/10/23 11:55 PM  Result Value Ref Range   Glucose-Capillary 252 (H) 70 - 99 mg/dL    Comment: Glucose reference range applies only to samples taken after fasting for at least 8 hours.   Comment 1 Notify RN    Comment 2 Document in Chart   Glucose, capillary     Status: Abnormal   Collection Time: 11/11/23  3:41 AM  Result Value Ref Range   Glucose-Capillary 188 (H) 70 - 99 mg/dL    Comment: Glucose reference range applies only to samples taken after fasting for at least 8 hours.  Triglycerides     Status: Abnormal   Collection Time: 11/11/23  5:29 AM  Result Value Ref Range   Triglycerides 280 (H) <150 mg/dL    Comment: Performed at [facility], [address] [place], [place] [address]  Glucose, capillary     Status: Abnormal   Collection Time: 11/11/23  7:46 AM  Result Value Ref Range   Glucose-Capillary 243 (H) 70 - 99 mg/dL    Comment: Glucose reference range applies only to samples taken after fasting for at least  8 hours.  Glucose, capillary     Status: Abnormal   Collection Time: 11/11/23 11:35 AM  Result Value Ref Range   Glucose-Capillary 268 (H) 70 - 99 mg/dL    Comment: Glucose reference range applies only to samples taken after fasting for at least 8 hours.  Glucose, capillary     Status: Abnormal   Collection Time: 11/11/23  3:42 PM  Result Value Ref Range   Glucose-Capillary 189 (H) 70 - 99 mg/dL    Comment: Glucose reference range applies only to samples taken after fasting for at least 8 hours.  Culture, blood (Routine X 2) w Reflex to ID Panel     Status: None (Preliminary result)   Collection Time: 11/11/23  4:48 PM   Specimen: BLOOD LEFT HAND  Result Value Ref Range   Specimen Description      BLOOD LEFT HAND Performed at [facility], [address], [place], [place] [address]    Special Requests      BOTTLES DRAWN AEROBIC AND ANAEROBIC Blood Culture results may not  be optimal due to an inadequate volume of blood received in culture bottles Performed at [facility], [address], [place], [place] [address]    Culture PENDING    Report Status PENDING   Culture, blood (Routine X 2) w Reflex to ID Panel     Status: None (Preliminary result)   Collection Time: 11/11/23  4:49 PM   Specimen: BLOOD RIGHT HAND  Result Value Ref Range   Specimen Description      BLOOD RIGHT HAND Performed at [facility], [address], [place], [place] [address]    Special Requests      BOTTLES DRAWN AEROBIC AND ANAEROBIC Blood Culture adequate volume Performed at [facility], [address] [place], [place] [address]    Culture PENDING    Report Status PENDING   Glucose, capillary     Status: Abnormal   Collection Time: 11/11/23  8:11 PM  Result Value Ref Range   Glucose-Capillary 200 (H) 70 - 99 mg/dL    Comment: Glucose reference range applies only to samples taken after fasting for at least 8 hours.  Glucose, capillary     Status:  Abnormal   Collection Time: 11/11/23 11:59 PM  Result Value Ref Range   Glucose-Capillary 185 (H) 70 - 99 mg/dL    Comment: Glucose reference range applies only to samples taken after fasting for at least 8 hours.   Comment 1 Notify RN    Comment 2 Document in Chart   Prealbumin     Status: None   Collection Time: 11/12/23 12:51 AM  Result Value Ref Range   Prealbumin 29 18 - 38 mg/dL    Comment: Performed at [facility], [address], [place], [place] [address]  Comprehensive metabolic panel with GFR     Status: Abnormal   Collection Time: 11/12/23 12:51 AM  Result Value Ref Range   Sodium 146 (H) 135 - 145 mmol/L   Potassium 3.4 (L) 3.5 - 5.1 mmol/L   Chloride 110 98 - 111 mmol/L   CO2 27 22 - 32 mmol/L   Glucose, Bld 181 (H) 70 - 99 mg/dL    Comment: Glucose reference range applies only to samples taken after fasting for at least 8 hours.   BUN 90 (H) 6 - 20 mg/dL   Creatinine, Ser 4.40 (H) 0.61 - 1.24 mg/dL   Calcium 8.7 (L) 8.9 - 10.3 mg/dL   Total Protein 6.2 (L) 6.5 - 8.1 g/dL   Albumin 3.0 (L) 3.5 - 5.0 g/dL   AST 33 15 - 41 U/L   ALT 36 0 - 44 U/L   Alkaline Phosphatase 46 38 - 126 U/L   Total Bilirubin 0.6 0.0 - 1.2 mg/dL   GFR, Estimated >34 >74 mL/min    Comment: (NOTE) Calculated using the CKD-EPI Creatinine Equation (2021)    Anion gap 9 5 - 15    Comment: Performed at [facility], [address] [place], [place] [address]  Triglycerides     Status: Abnormal   Collection Time: 11/12/23  2:45 AM  Result Value Ref Range   Triglycerides 514 (H) <150 mg/dL    Comment: Performed at [facility], [address], [place], [place] [address]  CBC     Status: Abnormal   Collection Time: 11/12/23  2:45 AM  Result Value Ref Range   WBC 14.9 (H) 4.0 - 10.5 K/uL   RBC 3.13 (L) 4.22 -  5.81 MIL/uL   Hemoglobin 9.8 (L) 13.0 - 17.0 g/dL   HCT 32.4 (L) 40.1 - 02.7 %   MCV 106.7 (H) 80.0 - 100.0 fL   MCH 31.3 26.0 - 34.0 pg    MCHC 29.3 (L) 30.0 - 36.0 g/dL   RDW 25.3 66.4 - 40.3 %   Platelets 312 150 - 400 K/uL   nRBC 0.0 0.0 - 0.2 %    Comment: Performed at [facility], [address], [place], [place] [address]  Magnesium     Status: Abnormal   Collection Time: 11/12/23  2:45 AM  Result Value Ref Range   Magnesium 3.2 (H) 1.7 - 2.4 mg/dL    Comment: Performed at [facility], [address] [place], [place] [address]  Phosphorus     Status: None   Collection Time: 11/12/23  2:45 AM  Result Value Ref Range   Phosphorus 3.6 2.5 - 4.6 mg/dL    Comment: Performed at [facility], [address] [place], [place] [address]  Glucose, capillary     Status: Abnormal   Collection Time: 11/12/23  3:47 AM  Result Value Ref Range   Glucose-Capillary 201 (H) 70 - 99 mg/dL    Comment: Glucose reference range applies only to samples taken after fasting for at least 8 hours.   Comment 1 Notify RN    Comment 2 Document in Chart   Glucose, capillary     Status: Abnormal   Collection Time: 11/12/23  7:47 AM  Result Value Ref Range   Glucose-Capillary 179 (H) 70 - 99 mg/dL    Comment: Glucose reference range applies only to samples taken after fasting for at least 8 hours.    Imaging / Studies: CT ABDOMEN PELVIS W CONTRAST Result Date: 11/11/2023 CLINICAL DATA:  Fever and leukocytosis with recent surgery. EXAM: CT ABDOMEN AND PELVIS WITH CONTRAST TECHNIQUE: Multidetector CT imaging of the abdomen and pelvis was performed using the standard protocol following bolus administration of intravenous contrast. RADIATION DOSE REDUCTION: This exam was performed according to the departmental dose-optimization program which includes automated exposure control, adjustment of the mA and/or kV according to patient size and/or use of iterative reconstruction technique. CONTRAST:  OMNIPAQUE IOHEXOL 300 MG/ML  SOLN COMPARISON:  CT abdomen and pelvis 11/08/2018 FINDINGS:  Lower chest: There is patchy airspace consolidation in the lung bases, right greater than left, which has increased. Hepatobiliary: Mildly enlarged. No focal liver abnormality is seen. No gallstones, gallbladder wall thickening, or biliary dilatation. Pancreas: Unremarkable. No pancreatic ductal dilatation or surrounding inflammatory changes. Spleen: Mildly enlarged. Adrenals/Urinary Tract: There is a low-density indeterminate right adrenal nodule which is unchanged measuring 17 mm. The left adrenal gland is within normal limits. Bladder and distal in ureters are not well seen secondary to streak artifact in the pelvis. There is no hydronephrosis. There is mild nonspecific bilateral perinephric fat stranding. Right renal hypodensities too small to characterize, but likely cysts measuring up to 19 mm. There is likely hyperdense cyst in the superior pole left kidney measuring 17 mm, also unchanged. Stomach/Bowel: No evidence of bowel wall thickening, distention, or inflammatory changes. The appendix is not seen. There are air-fluid levels scattered throughout the colon. Nasogastric tube tip is in the body of the stomach. The stomach is nondilated. Vascular/Lymphatic: Aortic atherosclerosis. No enlarged abdominal or pelvic lymph nodes. Reproductive: Not well evaluated secondary to streak artifact in the pelvis. Other: There is no ascites. Lobulated subcutaneous fluid collection is seen  in the anterior abdominal wall measuring 8.7 x 3.1 x 11.8 cm similar in size when compared to prior. Air has resolved within the fluid collection. Small amount of subcutaneous air is present, but has also decreased. There is no abdominal wall hernia. There is skin thickening, subcutaneous edema and rounded subcutaneous nodule/cyst lateral to the right hip which is unchanged. Musculoskeletal: Bilateral hip arthroplasties are present. No acute fractures are seen. Degenerative changes affect the spine. IMPRESSION: 1. Stable size of the  lobulated subcutaneous fluid collection in the anterior abdominal wall. Air has resolved within the fluid collection. Abscess is not excluded in the appropriate clinical setting. 2. Air-fluid levels scattered throughout the colon compatible with diarrheal illness. 3. Increasing patchy airspace consolidation in the lung bases, right greater than left, worrisome for pneumonia. 4. Stable indeterminate right adrenal nodule. This can be further evaluated with adrenal protocol CT or MRI. 5. Stable mild hepatosplenomegaly. 6. Stable skin thickening, subcutaneous edema and rounded subcutaneous nodule/cyst lateral to the right hip. 7. Aortic atherosclerosis. Electronically Signed   By: Tyron Gallon M.D.   On: 11/11/2023 20:37   MR BRAIN WO CONTRAST Result Date: 11/11/2023 CLINICAL DATA:  Mental status change, unknown cause EXAM: MRI HEAD WITHOUT CONTRAST TECHNIQUE: Multiplanar, multiecho pulse sequences of the brain and surrounding structures were obtained without intravenous contrast. COMPARISON:  CT head November 08, 2023. FINDINGS: Brain: No acute infarction, hemorrhage, hydrocephalus, extra-axial collection or mass lesion. Mild scattered T2/FLAIR hyperintensities in the white matter nonspecific but compatible with chronic microvascular ischemic disease. Vascular: Major arterial flow voids are maintained at the skull base. Skull and upper cervical spine: Normal marrow signal. Sinuses/Orbits: Mild paranasal sinus mucosal thickening. No acute orbital findings. Other: Small mastoid effusions. IMPRESSION: No evidence of acute intracranial abnormality. Electronically Signed   By: Stevenson Elbe M.D.   On: 11/11/2023 20:08   DG CHEST PORT 1 VIEW Result Date: 11/11/2023 CLINICAL DATA:  Shortness of breath EXAM: PORTABLE CHEST 1 VIEW COMPARISON:  Chest radiograph dated 11/06/2023 FINDINGS: Lines/tubes: Endotracheal tube tip projects 4.8 cm above the carina. Enteric tube tip reaches the diaphragm and terminates below the  field of view. Partially imaged metal tip probe projects over the midline neck. Lungs: Low lung volumes with bronchovascular crowding. Bibasilar hazy and patchy opacities. Diffuse interstitial opacities. Pleura: Blunting of the right costophrenic angle. No definite pneumothorax. Heart/mediastinum: The heart size and mediastinal contours are within normal limits. Bones: No acute osseous abnormality. IMPRESSION: 1. Endotracheal tube tip projects 4.8 cm above the carina. 2. Bibasilar hazy and patchy opacities, which may represent atelectasis, aspiration, or pneumonia. 3. Diffuse interstitial opacities, which may represent pulmonary edema. 4. Blunting of the right costophrenic angle, which may represent a small pleural effusion. Electronically Signed   By: Limin  Xu M.D.   On: 11/11/2023 14:39     Medications / Allergies: per chart  Antibiotics: Anti-infectives (From admission, onward)    Start     Dose/Rate Route Frequency Ordered Stop   11/12/23 1400  meropenem (MERREM) 1 g in sodium chloride 0.9 % 100 mL IVPB        1 g 200 mL/hr over 30 Minutes Intravenous Every 8 hours 11/12/23 0756     11/11/23 1500  linezolid (ZYVOX) IVPB 600 mg        600 mg 300 mL/hr over 60 Minutes Intravenous Every 12 hours 11/11/23 1413     11/11/23 1500  meropenem (MERREM) 500 mg in sodium chloride 0.9 % 100 mL IVPB  Status:  Discontinued  500 mg 200 mL/hr over 30 Minutes Intravenous Every 8 hours 11/11/23 1413 11/12/23 0756   11/09/23 1100  rifaximin (XIFAXAN) tablet 550 mg        550 mg Per Tube 2 times daily 11/09/23 1012     11/08/23 1815  piperacillin-tazobactam (ZOSYN) IVPB 3.375 g  Status:  Discontinued        3.375 g 12.5 mL/hr over 240 Minutes Intravenous Every 8 hours 11/08/23 1722 11/10/23 0945   11/08/23 1245  Ampicillin-Sulbactam (UNASYN) 3 g in sodium chloride 0.9 % 100 mL IVPB  Status:  Discontinued        3 g 200 mL/hr over 30 Minutes Intravenous Every 6 hours 11/08/23 1157 11/08/23 1717    11/05/23 0800  piperacillin-tazobactam (ZOSYN) IVPB 3.375 g  Status:  Discontinued        3.375 g 12.5 mL/hr over 240 Minutes Intravenous Every 8 hours 11/05/23 0747 11/08/23 1151   10/31/23 2000  ceFAZolin (ANCEF) IVPB 1 g/50 mL premix        1 g 100 mL/hr over 30 Minutes Intravenous Every 8 hours 10/31/23 1606 11/01/23 0449   10/31/23 0600  ceFAZolin (ANCEF) IVPB 2g/100 mL premix  Status:  Discontinued        2 g 200 mL/hr over 30 Minutes Intravenous On call to O.R. 10/31/23 0536 10/31/23 0538   10/31/23 0536  ceFAZolin (ANCEF) IVPB 2g/100 mL premix        2 g 200 mL/hr over 30 Minutes Intravenous 30 min pre-op 10/31/23 0536 10/31/23 1127         Note: Portions of this report may have been transcribed using voice recognition software. Every effort was made to ensure accuracy; however, inadvertent computerized transcription errors may be present.   Any transcriptional errors that result from this process are unintentional.    Eddye Goodie, MD, FACS, MASCRS Esophageal, Gastrointestinal & Colorectal Surgery Robotic and Minimally Invasive Surgery  [facility] [address] [place], [place] [address] [phone] Fax [phone] Main  CONTACT INFORMATION: Weekday (9AM-5PM): Call [facility] main office at [phone] Weeknight (5PM-9AM) or Weekend/Holiday: Check EPIC "Web Links" tab & use "AMION" (password " [email or web address]") for General Surgery [facility] coverage  Please, DO NOT use SecureChat  (it is not reliable communication to reach operating surgeons & will lead to a delay in care).   [employer]pic staff messaging available for outptient concerns needing 1-2 business day response.      11/12/2023  8:27 AM

## 2023-11-12 NOTE — Plan of Care (Signed)
  Problem: Education: Goal: Knowledge of General Education information will improve Description: Including pain rating scale, medication(s)/side effects and non-pharmacologic comfort measures Outcome: Progressing   Problem: Health Behavior/Discharge Planning: Goal: Ability to manage health-related needs will improve Outcome: Progressing   Problem: Clinical Measurements: Goal: Ability to maintain clinical measurements within normal limits will improve Outcome: Progressing Goal: Will remain free from infection Outcome: Progressing Goal: Diagnostic test results will improve Outcome: Progressing Goal: Respiratory complications will improve Outcome: Progressing Goal: Cardiovascular complication will be avoided Outcome: Progressing   Problem: Activity: Goal: Risk for activity intolerance will decrease Outcome: Progressing   Problem: Nutrition: Goal: Adequate nutrition will be maintained Outcome: Progressing   Problem: Coping: Goal: Level of anxiety will decrease Outcome: Progressing   Problem: Elimination: Goal: Will not experience complications related to bowel motility Outcome: Progressing Goal: Will not experience complications related to urinary retention Outcome: Progressing   Problem: Pain Managment: Goal: General experience of comfort will improve and/or be controlled Outcome: Progressing   Problem: Safety: Goal: Ability to remain free from injury will improve Outcome: Progressing   Problem: Skin Integrity: Goal: Risk for impaired skin integrity will decrease Outcome: Progressing   Problem: Education: Goal: Ability to describe self-care measures that may prevent or decrease complications (Diabetes Survival Skills Education) will improve Outcome: Progressing Goal: Individualized Educational Video(s) Outcome: Progressing   Problem: Coping: Goal: Ability to adjust to condition or change in health will improve Outcome: Progressing   Problem: Fluid  Volume: Goal: Ability to maintain a balanced intake and output will improve Outcome: Progressing   Problem: Health Behavior/Discharge Planning: Goal: Ability to identify and utilize available resources and services will improve Outcome: Progressing Goal: Ability to manage health-related needs will improve Outcome: Progressing   Problem: Metabolic: Goal: Ability to maintain appropriate glucose levels will improve Outcome: Progressing   Problem: Nutritional: Goal: Maintenance of adequate nutrition will improve Outcome: Progressing Goal: Progress toward achieving an optimal weight will improve Outcome: Progressing   Problem: Skin Integrity: Goal: Risk for impaired skin integrity will decrease Outcome: Progressing   Problem: Tissue Perfusion: Goal: Adequacy of tissue perfusion will improve Outcome: Progressing   Problem: Education: Goal: Knowledge of the prescribed therapeutic regimen will improve Outcome: Progressing   Problem: Bowel/Gastric: Goal: Gastrointestinal status for postoperative course will improve Outcome: Progressing   Problem: Clinical Measurements: Goal: Postoperative complications will be avoided or minimized Outcome: Progressing   Problem: Respiratory: Goal: Ability to achieve and maintain a regular respiratory rate will improve Outcome: Progressing   Problem: Skin Integrity: Goal: Demonstration of wound healing without infection will improve Outcome: Progressing   Problem: Urinary Elimination: Goal: Ability to avoid or minimize complications of infection will improve Outcome: Progressing Goal: Ability to achieve and maintain urine output will improve Outcome: Progressing   Problem: Safety: Goal: Non-violent Restraint(s) Outcome: Progressing

## 2023-11-13 DIAGNOSIS — N179 Acute kidney failure, unspecified: Secondary | ICD-10-CM | POA: Diagnosis not present

## 2023-11-13 DIAGNOSIS — J9620 Acute and chronic respiratory failure, unspecified whether with hypoxia or hypercapnia: Secondary | ICD-10-CM | POA: Diagnosis not present

## 2023-11-13 DIAGNOSIS — D49511 Neoplasm of unspecified behavior of right kidney: Secondary | ICD-10-CM | POA: Diagnosis not present

## 2023-11-13 DIAGNOSIS — J441 Chronic obstructive pulmonary disease with (acute) exacerbation: Secondary | ICD-10-CM | POA: Diagnosis not present

## 2023-11-13 LAB — BLOOD CULTURE ID PANEL (REFLEXED) - BCID2

## 2023-11-13 MED ORDER — ACETAMINOPHEN 160 MG/5ML PO SOLN
650.0000 mg | Freq: Four times a day (QID) | ORAL | Status: DC | PRN
Start: 2023-11-13 — End: 2023-11-15

## 2023-11-13 MED ORDER — ACETAMINOPHEN 10 MG/ML IV SOLN
1000.0000 mg | Freq: Once | INTRAVENOUS | Status: AC
  Administered 2023-11-13: 1000 mg via INTRAVENOUS
  Filled 2023-11-13: qty 100

## 2023-11-13 NOTE — Progress Notes (Addendum)
 11/13/2023  Preston [name] [id number] [birth year]  CARE TEAM: PCP: Claiborne Rigg, NP  Outpatient Care Team: Patient Care Team: Claiborne Rigg, NP as PCP - General (Nurse Practitioner) Jake Bathe, MD as PCP - Cardiology (Cardiology) Willis Modena, MD as Consulting Physician (Gastroenterology) Karie Soda, MD as Consulting Physician (General Surgery) Heloise Purpura, MD as Consulting Physician (Urology)  Inpatient Treatment Team: Treatment Team:  Heloise Purpura, MD Karie Soda, MD Marykay Lex, MD Pccm, Md, MD Edsel Petrin, DO Odette Horns, RN Lance Coon, RN Julio Alm, RN Delfina Redwood, RN Pricilla Riffle, [facility]   Problem List:   Principal Problem:   Neoplasm of right kidney Active Problems:   Tobacco abuse   HLD (hyperlipidemia)   Alcohol abuse   Chronic diastolic congestive heart failure (HCC)   Gastroesophageal reflux disease without esophagitis   Type 2 diabetes mellitus treated without insulin (HCC)   Essential hypertension   Acute kidney injury (HCC)   Obesity, Class II, BMI 35-39.9   Diastasis recti   Incarcerated incisional hernia s/p repair w mesh 10/31/2023   History of Left Renal cell cancer   Hyponatremia   Acute hypoxemic respiratory failure (HCC)   Alcohol withdrawal delirium (HCC)   ABLA (acute blood loss anemia)   Primary hypertension   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  Swiss cheese hernias going down infraumbilically connecting with his prior low midline incision as  well.  Umbilical hernia.   Type of ventral wall repair:  Laparoscopic underlay repair .with Primary repair of largest hernia Placement of mesh: Centrally intraperitoneal with edges tucked into RECTRORECTUS & preperitoneal space Name of mesh: Bard Ventralight dual sided (polypropylene / Seprafilm) Size of mesh: 33x27cm Orientation: Vertical Mesh overlap:  5-7cm  10/31/2023   Postoperative diagnosis: Right renal neoplasm   Procedure:   Right robotic-assisted laparoscopic partial nephrectomy Intraoperative renal ultrasonography   Surgeon: Moody Bruins. M.D.   Intraoperative findings:       1. Warm renal ischemia time: 22 minutes       2. Intraoperative renal ultrasound findings: There was a small cystic appearing mass that measured about 1 cm adjacent to a larger renal cyst.   Drains: # 15 Blake perinephric drain  FINAL MICROSCOPIC DIAGNOSIS:  A. 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 PERINEPHRIC FAT, EXCISION: Benign fibroadipose tissue  C. HERNIA SAC: Benign fibromuscular and adipose tissue     Assessment [facility] Stay = 11 days) 13 Days Post-Op    Persistent delirium and confusion post-op most likely due metabolic encephalopathy  Plan:  Main issue seems to be confusion and agitation and oversedation.    Discussion made between palliative care and family yesterday.  They wish to back off vent support and make DNI/DNR.  Patient extubated last night.  On nasal cannula.  CT MRI EEG is negative for any strong evidence of stroke or seizure activity.  Trying to adjust regimen to avoid  agitation and oversedation.  Off phenobarbital.    Will defer to critical care and neurology.    Concern of hepatic insufficiency not surprising with his heavy alcohol use.  LFTs not severe.  PT/INR okay.  Renal function stable considering his had bilateral partial  nephrectomies.  I saw no evidence of cirrhosis intraoperatively nor based on CT scans but most likely has steatohepatitis with some hepatic insufficiency ammonia only mildly elevated.  Critical care trying to intervene just in case.  I think all meds are on hold for now since he is extubated  Snoring - consider CPAP vs at bedtime?  Pain control per critical care.    Tachycardic this morning.  Most likely due to some agitation.  Defer to critical care  Afebrile and leukocytosis resolving encouraging there is no evidence of active infection in the abdomen nor abdominal wall.  CT scan chest patient's concern for pneumonia.  Therefore CCM restarted IV ABx = linezolid and meropenem 4/14  Postoperative abdominal wall hematoma/seroma not surprising.   Area is soft and flat with no cellulitis abscess or drainage.  CT scan done 4/14 notes postop gas resolved and not worsening.    No enhancement nor abscess formation.  Incision c/d/I.   Consistent with healing.    Given recurrent fever on antibiotics, I did sterile needle aspiration at bedside with family (pt's wife) consent.  Immediately aspirated thin bloody fluid.  No purulence or cloudiness.  Consistent with hematoma/seroma.  I updated patient's spouse at bedside and ICU nurse.  Fluid be sent off for culture just in case.  If concern for infection will open incision and pack but try to avoid this since it looks benign  MiraLAX bowel regimen to avoid constipation given the need for constant narcotics for pain control and sedation  Nutrition to help him still recover.  Tube feeds on hold with extubation since they are backing off vent support and see how he does.  At some point would benefit some nutrition.  Probably can wait if not eating this week and reconsider nasojejunal tube feeds.  Sounds like family is against that.  prealbumin 29 and albumin 3 this week not too bad in the setting, arguing against malnutrition.  Most aggressive option is trach &  PEG 14d postop - family very against that for now.  Pathology on renal mass benign renal papillary adenoma.  Reassuring.  Foley catheter per CCM & Urology.    monitor electrolytes & replace as needed  Keep K>4, Mg>2, Phos>3  -VTE prophylaxis- SCDs.  Anticoagulation prophyllaxis SQ as appropriate.  Okay to fully anticoagulate if there are concerns  I updated the patient's status to the patient's nurse  Recommendations were made.  Questions were answered.  She expressed understanding & appreciation.  -Disposition: TBD.  I am still guardedly hopeful he will clear out and be okay with a long recovery.  Family decided to get off ventilator and call the question.  Will defer to critical care palliative family wishes   I reviewed nursing notes, last 24 h vitals and pain scores, last 48 h intake and output, last 24 h labs and trends, and last 24 h imaging results.  I have reviewed this patient's available data, including medical history, events of note, test results, etc as part of my evaluation.   A significant portion of that time was spent in counseling. Care during the described time interval was provided by me.  This care required moderate level of medical decision making.  11/13/2023  Subjective: (Chief complaint)  Family met with palliative care.  Decision to extubate and do more comfort measures.  Patient on on IV sedation and pain control for now.  Patient not agitated at this time.  Will try to focus eyes and move head to person speaking.  Not consistently following commands.  I came back to find male family member in room.  Talking with patient.  Looking at monitor and discussing vital signs.  Appreciative.  ICU nursing just outside room   Objective:  Vital signs:  Vitals:   11/13/23 0500 11/13/23 0600 11/13/23 0631 11/13/23 0700  BP: (!) 159/91 (!) 160/89 (!) 149/80 (!) 183/86  Pulse: (!) 137 (!) 136 (!) 132 (!) 135  Resp: (!) 21 17 17 14   Temp:    (!) 103.1 F  (39.5 C)  TempSrc:    Oral  SpO2: 90% 90% 90% 92%  Weight: 125.6 kg     Height:        Last BM Date : 11/12/23  Intake/Output   Yesterday:  04/15 0701 - 04/16 0700 In: 3308.7 [I.V.:1410.8; NG/GT:1208; IV Piggyback:689.8] Out: 3600 [Urine:3600] This shift:  Total I/O In: 89.1 [I.V.:45.7; NG/GT:43.3] Out: -   Bowel function:  Flatus: YES  BM:  YES  Drain: (No drain)   Physical Exam: Clinical ICU nurse team & Dr Laverle Patter in room with me  General: Pt extubated.  Does open eyes to voice and touch but not consistently following commands.  mild acute distress.  Occasionally shaking his head and moving a little bit. Eyes: PERRL  Sclera clear.  No icterus.  Initial upward gaze. Neuro: No focal sensory/motor deficits.  Not consistently following commands but moving hands and toes intermittently Lymph: No head/neck/groin lymphadenopathy Psych: Sedated at this point.  No more agitation/delirium  HENT: Normocephalic, Mucus membranes moist.  No thrush.  Orogastric tube just placed. Neck: Supple, No tracheal deviation.  No obvious thyromegaly Chest: No pain to chest wall compression.  Good respiratory excursion.  No audible wheezing but using abdominal wall to breathe.  Minimal conversational dyspnea. CV:  Pulses intact.  Regular rhythm.  No major extremity edema MS: Normal AROM mjr joints.  No obvious deformity  Abdomen: Obese Soft.  Nondistended.  Nontender.  Ecchymosis abdominal stable and slowly resolving.   Incisions clean dry intact.  Incision remains closed without cellulitis nor drainage.  No fluctuance.  No guarding or peritonitis.  Argues against infection.   The anatomy and physiology of the region was discussed with patient's family member at bedside..  The pathophysiology of seroma & other fluid collections discussed.  Differential diagnosis was discussed.  Options discussed.  Recommendation was made for needle aspiration of fluid to help the seroma resolved.  Need for  possible repeated aspiration for possible drain placement or even open drainage in the future was discussed.  The region was sterilely prepped.  Fluid was aspirated with a sterile needle to good result.  Volume removed = 5 mL.  Nature of fluid: Dark red consistent with liquefied hematoma/seroma.  No purulence.  No foul odor.  Patient tolerated procedure well.  Specimen handed off to ICU nurse once culture swab available to transfer from syringe to  Ext:   No deformity.  No mjr edema.  No cyanosis Skin: No petechiae / purpurea.  No major sores.  Warm and dry    Results:    SURGICAL PATHOLOGY CASE: [id number] PATIENT: Bertin Ware Surgical Pathology Report     Clinical History: Right renal mass, ventral incisional  incarcerated hernia (jlr)     FINAL MICROSCOPIC DIAGNOSIS:  A. 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 PERINEPHRIC FAT, EXCISION: Benign fibroadipose tissue  C. HERNIA SAC: Benign fibromuscular and adipose tissue     Trevion Hoben DESCRIPTION:  A: Specimen is received fresh and consists of a 4.7 x 2.8 x 1.7 cm piece of tan soft tissue, with a small amount of overlying tan-yellow adipose tissue.  Resection margin is inked black, specimen is serially sectioned reveal tan parenchyma.  There is a 1.0 x 1.0 x 0.9 cm tan-white, solid, well-circumscribed lesion identified.  Lesion abuts the outer capsule, and measures 0.2 cm to the inked resection margin.  A 1.1 x 1.0 x 0.9 cm cortical cyst is identified.  No other lesions are identified. Representative sections are submitted in 5 cassettes. 1-3 = tan-white lesion 4 and 5 = cortical cyst  B: Specimen is received fresh, and consists of a 4.1 x 2.8 x 1.3 cm portion of tan-yellow adipose tissue.  Sectioning reveals grossly unremarkable cut surfaces.  Representative sections are submitted in 1 cassette.  C: Specimen  is received fresh and consists of an 8.0 x 7.1 x 2.3 cm aggregate of tan-pink fibromembranous tissue and attached adipose tissue.  Sectioning reveals focally fibrotic cut surfaces. Representative sections are submitted in 3 cassettes.  Lovey Newcomer 11/01/2023)   Final Diagnosis performed by Marlena Clipper, MD.   Electronically signed 11/04/2023 Technical and / or Professional components performed at [facility], [address] [place], [place] [address].  Immunohistochemistry Technical component (if applicable) was performed at [facility]. [address], [place], [place] [address].   IMMUNOHISTOCHEMISTRY DISCLAIMER (if applicable): Some of these immunohistochemical stains may have been developed and the performance characteristics determine by [facility]. Some may not have been cleared or approved by the U.S. Food and Drug Administration. The FDA has determined that such clearance or approval is not necessary. This test is used for clinical purposes. It should not be regarded as investigational or for research. This laboratory is certified under the Clinical Laboratory Improvement Amendments of 1988 (CLIA-88) as qualified to perform high complexity clinical laboratory testing.  The controls stained appropriately.   IHC stains are performed on formalin fixed, paraffin embedded tissue using a 3,3"diaminobenzidine (DAB) chromogen and Leica Bond Autostainer System. The staining intensity of the nucleus is score manually and is reported as the percentage of tumor cell nuclei demonstrating specific nuclear staining. The specimens are fixed in 10% Neutral Formalin for at least 6 hours and up to 72hrs. These tests are validated on decalcified tissue. Results should be interpreted with caution given the possibility of false negative results on decalcified specimens. Antibody Clones are as follows ER-clone 8F, PR-clone 16, Ki67- clone MM1. Some of  these immunohistochemical stains may have been developed and the performance characteristics determined by [facility].    Cultures: Recent Results (from the past 720 hours)  MRSA Next Gen by PCR, Nasal     Status: None   Collection Time: 11/04/23  6:08 PM   Specimen: Nasal Mucosa; Nasal Swab  Result Value Ref Range Status   MRSA by PCR Next Gen NOT DETECTED NOT DETECTED Final    Comment: (NOTE) The GeneXpert MRSA Assay (FDA approved for NASAL specimens only), is one component of a comprehensive MRSA colonization surveillance program. It is not intended to diagnose MRSA infection nor to  guide or monitor treatment for MRSA infections. Test performance is not FDA approved in patients less than 52 years old. Performed at [facility], [address] [place], [place] [address]   Culture, blood (Routine X 2) w Reflex to ID Panel     Status: None   Collection Time: 11/05/23  8:22 AM   Specimen: BLOOD  Result Value Ref Range Status   Specimen Description   Final    BLOOD BLOOD RIGHT ARM AEROBIC BOTTLE ONLY ANAEROBIC BOTTLE ONLY Performed at [facility], [address], [place], [place] [address]    Special Requests   Final    BOTTLES DRAWN AEROBIC AND ANAEROBIC Blood Culture results may not be optimal due to an inadequate volume of blood received in culture bottles Performed at [facility], [address], [place], [place] [address]    Culture   Final    NO GROWTH 5 DAYS Performed at [facility], [address], [place], [place] [address]    Report Status 11/10/2023 FINAL  Final  Culture, blood (Routine X 2) w Reflex to ID Panel     Status: None   Collection Time: 11/05/23  8:22 AM   Specimen: BLOOD  Result Value Ref Range Status   Specimen Description   Final    BLOOD BLOOD RIGHT HAND AEROBIC BOTTLE ONLY ANAEROBIC BOTTLE ONLY Performed at [facility], [address] [place],  [place] [address]    Special Requests   Final    BOTTLES DRAWN AEROBIC AND ANAEROBIC Blood Culture adequate volume Performed at [facility], [address] [place], [place] [address]    Culture   Final    NO GROWTH 5 DAYS Performed at [facility], [address], [place], [place] [address]    Report Status 11/10/2023 FINAL  Final  Culture, Respiratory w Gram Stain     Status: None   Collection Time: 11/05/23  9:56 AM   Specimen: SPU; Respiratory  Result Value Ref Range Status   Specimen Description   Final    SPUTUM Performed at [facility], [address], [place], [place] [address]    Special Requests   Final    SPUTUM Performed at [facility], [address], [place], [place] [address]    Gram Stain   Final    NO WBC SEEN FEW GRAM POSITIVE COCCI IN PAIRS IN CHAINS    Culture   Final    RARE Normal respiratory flora-no Staph aureus or Pseudomonas seen Performed at [facility], [address] [place], [place] [address]    Report Status 11/07/2023 FINAL  Final  Culture, blood (Routine X 2) w Reflex to ID Panel     Status: None (Preliminary result)   Collection Time: 11/11/23  4:48 PM   Specimen: BLOOD LEFT HAND  Result Value Ref Range Status   Specimen Description   Final    BLOOD LEFT HAND Performed at [facility], [address], [place], [place] [address]    Special Requests   Final    BOTTLES DRAWN AEROBIC AND ANAEROBIC Blood Culture results may not be optimal due to an inadequate volume of blood received in culture bottles Performed at [facility], [address], [place], [place] [address]    Culture   Final    NO GROWTH < 24 HOURS Performed at [facility], [address] [place], [place] [address]    Report Status PENDING  Incomplete  Culture, blood (Routine X 2) w Reflex to ID Panel     Status: None (Preliminary result)   Collection Time: 11/11/23  4:49 PM    Specimen: BLOOD RIGHT HAND  Result Value Ref Range Status   Specimen Description   Final    BLOOD RIGHT HAND Performed at [facility], [address], [place], [place] [address]    Special Requests   Final    BOTTLES DRAWN AEROBIC AND ANAEROBIC Blood Culture adequate volume Performed at [facility], [address], [place], [place] [address]    Culture  Setup Time   Final    GRAM POSITIVE COCCI IN CLUSTERS BOTTLES DRAWN AEROBIC ONLY Organism ID to follow CRITICAL RESULT CALLED TO, READ BACK BY AND VERIFIED WITH: E JACKSON,[email or web address]  11/13/23 MK Performed at [facility], [address], [place], [place] [address]    Culture GRAM POSITIVE COCCI  Final   Report Status PENDING  Incomplete  Blood Culture ID Panel (Reflexed)     Status: Abnormal   Collection Time: 11/11/23  4:49 PM  Result Value Ref Range Status   Enterococcus faecalis NOT DETECTED NOT DETECTED Final   Enterococcus Faecium NOT DETECTED NOT DETECTED Final   Listeria monocytogenes NOT DETECTED NOT DETECTED Final   Staphylococcus species DETECTED (A) NOT DETECTED Final    Comment: CRITICAL RESULT CALLED TO, READ BACK BY AND VERIFIED WITH: E JACKSON,[email or web address]@0059  11/13/23 MK    Staphylococcus aureus (BCID) NOT DETECTED NOT DETECTED Final   Staphylococcus epidermidis DETECTED (A) NOT DETECTED Final    Comment: Methicillin (oxacillin) resistant coagulase negative staphylococcus. Possible blood culture contaminant (unless isolated from more than one blood culture draw or clinical case suggests pathogenicity). No antibiotic treatment is indicated for blood  culture contaminants.    Staphylococcus lugdunensis NOT DETECTED NOT DETECTED Final   Streptococcus species NOT DETECTED NOT DETECTED Final   Streptococcus agalactiae NOT DETECTED NOT DETECTED Final   Streptococcus pneumoniae NOT DETECTED NOT DETECTED Final   Streptococcus pyogenes NOT DETECTED NOT DETECTED Final    A.calcoaceticus-baumannii NOT DETECTED NOT DETECTED Final   Bacteroides fragilis NOT DETECTED NOT DETECTED Final   Enterobacterales NOT DETECTED NOT DETECTED Final   Enterobacter cloacae complex NOT DETECTED NOT DETECTED Final   Escherichia coli NOT DETECTED NOT DETECTED Final   Klebsiella aerogenes NOT DETECTED NOT DETECTED Final   Klebsiella oxytoca NOT DETECTED NOT DETECTED Final   Klebsiella pneumoniae NOT DETECTED NOT DETECTED Final   Proteus species NOT DETECTED NOT DETECTED Final   Salmonella species NOT DETECTED NOT DETECTED Final   Serratia marcescens NOT DETECTED NOT DETECTED Final   Haemophilus influenzae NOT DETECTED NOT DETECTED Final   Neisseria meningitidis NOT DETECTED NOT DETECTED Final   Pseudomonas aeruginosa NOT DETECTED NOT DETECTED Final   Stenotrophomonas maltophilia NOT DETECTED NOT DETECTED Final   Candida albicans NOT DETECTED NOT DETECTED Final   Candida auris NOT DETECTED NOT DETECTED Final   Candida glabrata NOT DETECTED NOT DETECTED Final   Candida krusei NOT DETECTED NOT DETECTED Final   Candida parapsilosis NOT DETECTED NOT DETECTED Final   Candida tropicalis NOT DETECTED NOT DETECTED Final   Cryptococcus neoformans/gattii NOT DETECTED NOT DETECTED Final   Methicillin resistance mecA/C DETECTED (A) NOT DETECTED Final    Comment: CRITICAL RESULT CALLED TO, READ BACK BY AND VERIFIED WITH: E JACKSON,[email or web address]@0059  11/13/23 MK Performed at [facility], [address], [place], [place] [address]     Labs: Results for orders placed or performed during the hospital encounter of 10/31/23 (from the past 48 hours)  Glucose, capillary     Status: Abnormal   Collection Time: 11/11/23 11:35 AM  Result Value Ref Range   Glucose-Capillary 268 (H) 70 - 99 mg/dL    Comment: Glucose reference range applies only to samples taken after fasting for at least 8 hours.  Glucose, capillary     Status: Abnormal   Collection Time: 11/11/23  3:42 PM  Result Value Ref  Range   Glucose-Capillary 189 (H) 70 - 99 mg/dL    Comment: Glucose reference range applies only to samples taken after fasting for at least 8 hours.  Culture, blood (Routine X 2) w Reflex to ID Panel     Status: None (Preliminary result)   Collection Time: 11/11/23  4:48 PM   Specimen: BLOOD LEFT HAND  Result Value Ref Range   Specimen Description      BLOOD LEFT HAND Performed at [facility], [address], [place], [place] [address]    Special Requests      BOTTLES DRAWN AEROBIC AND ANAEROBIC Blood Culture results may not be optimal due to an inadequate volume of blood received in culture bottles Performed at [facility], [address] [place], [place] [address]    Culture      NO GROWTH < 24 HOURS Performed at [facility], [address] [place], [place] [address]    Report Status PENDING   Culture, blood (Routine X 2) w Reflex to ID Panel     Status: None (Preliminary result)   Collection Time: 11/11/23  4:49 PM   Specimen: BLOOD RIGHT HAND  Result Value Ref Range   Specimen Description      BLOOD RIGHT HAND Performed at [facility], [address], [place], [place] [address]    Special Requests      BOTTLES DRAWN AEROBIC AND ANAEROBIC Blood Culture adequate volume Performed at [facility], [address], [place], [place] [address]    Culture  Setup Time      GRAM POSITIVE COCCI IN CLUSTERS BOTTLES DRAWN AEROBIC ONLY Organism ID to follow CRITICAL RESULT CALLED TO, READ BACK BY AND VERIFIED WITH: E JACKSON,[email or web address]  11/13/23 MK Performed at [facility], [address], [place], [place] [address]    Culture GRAM POSITIVE COCCI    Report Status PENDING   Blood Culture ID Panel (Reflexed)     Status: Abnormal   Collection Time: 11/11/23  4:49 PM  Result Value Ref Range   Enterococcus faecalis NOT DETECTED NOT DETECTED   Enterococcus Faecium NOT DETECTED NOT DETECTED   Listeria monocytogenes NOT  DETECTED NOT DETECTED   Staphylococcus species DETECTED (A) NOT DETECTED    Comment: CRITICAL RESULT CALLED TO, READ BACK BY AND VERIFIED WITH: E JACKSON,[email or web address]@0059  11/13/23 MK    Staphylococcus aureus (BCID) NOT DETECTED NOT DETECTED   Staphylococcus epidermidis DETECTED (A) NOT DETECTED    Comment: Methicillin (oxacillin) resistant coagulase negative staphylococcus. Possible blood culture contaminant (unless isolated from more than one blood culture draw or clinical case suggests pathogenicity). No antibiotic treatment is indicated for blood  culture contaminants.    Staphylococcus lugdunensis NOT DETECTED NOT DETECTED   Streptococcus species NOT DETECTED NOT DETECTED   Streptococcus agalactiae NOT DETECTED NOT DETECTED   Streptococcus pneumoniae NOT DETECTED NOT DETECTED   Streptococcus pyogenes  NOT DETECTED NOT DETECTED   A.calcoaceticus-baumannii NOT DETECTED NOT DETECTED   Bacteroides fragilis NOT DETECTED NOT DETECTED   Enterobacterales NOT DETECTED NOT DETECTED   Enterobacter cloacae complex NOT DETECTED NOT DETECTED   Escherichia coli NOT DETECTED NOT DETECTED   Klebsiella aerogenes NOT DETECTED NOT DETECTED   Klebsiella oxytoca NOT DETECTED NOT DETECTED   Klebsiella pneumoniae NOT DETECTED NOT DETECTED   Proteus species NOT DETECTED NOT DETECTED   Salmonella species NOT DETECTED NOT DETECTED   Serratia marcescens NOT DETECTED NOT DETECTED   Haemophilus influenzae NOT DETECTED NOT DETECTED   Neisseria meningitidis NOT DETECTED NOT DETECTED   Pseudomonas aeruginosa NOT DETECTED NOT DETECTED   Stenotrophomonas maltophilia NOT DETECTED NOT DETECTED   Candida albicans NOT DETECTED NOT DETECTED   Candida auris NOT DETECTED NOT DETECTED   Candida glabrata NOT DETECTED NOT DETECTED   Candida krusei NOT DETECTED NOT DETECTED   Candida parapsilosis NOT DETECTED NOT DETECTED   Candida tropicalis NOT DETECTED NOT DETECTED   Cryptococcus neoformans/gattii NOT DETECTED NOT DETECTED    Methicillin resistance mecA/C DETECTED (A) NOT DETECTED    Comment: CRITICAL RESULT CALLED TO, READ BACK BY AND VERIFIED WITH: E JACKSON,[email or web address]  11/13/23 MK Performed at [facility], [address], [place], [place] [address]   Glucose, capillary     Status: Abnormal   Collection Time: 11/11/23  8:11 PM  Result Value Ref Range   Glucose-Capillary 200 (H) 70 - 99 mg/dL    Comment: Glucose reference range applies only to samples taken after fasting for at least 8 hours.  Glucose, capillary     Status: Abnormal   Collection Time: 11/11/23 11:59 PM  Result Value Ref Range   Glucose-Capillary 185 (H) 70 - 99 mg/dL    Comment: Glucose reference range applies only to samples taken after fasting for at least 8 hours.   Comment 1 Notify RN    Comment 2 Document in Chart   Prealbumin     Status: None   Collection Time: 11/12/23 12:51 AM  Result Value Ref Range   Prealbumin 29 18 - 38 mg/dL    Comment: Performed at [facility], [address] [place], [place] [address]  Comprehensive metabolic panel with GFR     Status: Abnormal   Collection Time: 11/12/23 12:51 AM  Result Value Ref Range   Sodium 146 (H) 135 - 145 mmol/L   Potassium 3.4 (L) 3.5 - 5.1 mmol/L   Chloride 110 98 - 111 mmol/L   CO2 27 22 - 32 mmol/L   Glucose, Bld 181 (H) 70 - 99 mg/dL    Comment: Glucose reference range applies only to samples taken after fasting for at least 8 hours.   BUN 90 (H) 6 - 20 mg/dL   Creatinine, Ser 0.98 (H) 0.61 - 1.24 mg/dL   Calcium 8.7 (L) 8.9 - 10.3 mg/dL   Total Protein 6.2 (L) 6.5 - 8.1 g/dL   Albumin 3.0 (L) 3.5 - 5.0 g/dL   AST 33 15 - 41 U/L   ALT 36 0 - 44 U/L   Alkaline Phosphatase 46 38 - 126 U/L   Total Bilirubin 0.6 0.0 - 1.2 mg/dL   GFR, Estimated >11 >91 mL/min    Comment: (NOTE) Calculated using the CKD-EPI Creatinine Equation (2021)    Anion gap 9 5 - 15    Comment: Performed at [facility], [address], [place], [place]  [address]  Triglycerides  Status: Abnormal   Collection Time: 11/12/23  2:45 AM  Result Value Ref Range   Triglycerides 514 (H) <150 mg/dL    Comment: Performed at [facility], [address] [place], [place] [address]  CBC     Status: Abnormal   Collection Time: 11/12/23  2:45 AM  Result Value Ref Range   WBC 14.9 (H) 4.0 - 10.5 K/uL   RBC 3.13 (L) 4.22 - 5.81 MIL/uL   Hemoglobin 9.8 (L) 13.0 - 17.0 g/dL   HCT 19.1 (L) 47.8 - 29.5 %   MCV 106.7 (H) 80.0 - 100.0 fL   MCH 31.3 26.0 - 34.0 pg   MCHC 29.3 (L) 30.0 - 36.0 g/dL   RDW 62.1 30.8 - 65.7 %   Platelets 312 150 - 400 K/uL   nRBC 0.0 0.0 - 0.2 %    Comment: Performed at [facility], [address] [place], [place] [address]  Magnesium     Status: Abnormal   Collection Time: 11/12/23  2:45 AM  Result Value Ref Range   Magnesium 3.2 (H) 1.7 - 2.4 mg/dL    Comment: Performed at [facility], [address], [place], [place] [address]  Phosphorus     Status: None   Collection Time: 11/12/23  2:45 AM  Result Value Ref Range   Phosphorus 3.6 2.5 - 4.6 mg/dL    Comment: Performed at [facility], [address] [place], [place] [address]  Glucose, capillary     Status: Abnormal   Collection Time: 11/12/23  3:47 AM  Result Value Ref Range   Glucose-Capillary 201 (H) 70 - 99 mg/dL    Comment: Glucose reference range applies only to samples taken after fasting for at least 8 hours.   Comment 1 Notify RN    Comment 2 Document in Chart   Glucose, capillary     Status: Abnormal   Collection Time: 11/12/23  7:47 AM  Result Value Ref Range   Glucose-Capillary 179 (H) 70 - 99 mg/dL    Comment: Glucose reference range applies only to samples taken after fasting for at least 8 hours.  Blood gas, arterial     Status: Abnormal   Collection Time: 11/12/23  8:50 AM  Result Value Ref Range   FIO2 40 %   Delivery systems VENTILATOR    Mode CONTINUOUS POSITIVE  AIRWAY PRESSURE    PEEP 5 cm H20   Pressure support 5 cm H20   pH, Arterial 7.32 (L) 7.35 - 7.45   pCO2 arterial 61 (H) 32 - 48 mmHg   pO2, Arterial 63 (L) 83 - 108 mmHg   Bicarbonate 31.9 (H) 20.0 - 28.0 mmol/L   Acid-Base Excess 4.5 (H) 0.0 - 2.0 mmol/L   O2 Saturation 94.5 %   Patient temperature 36.8    Collection site RIGHT RADIAL    Drawn by [id number]    Allens test (pass/fail) PASS PASS    Comment: Performed at [facility], [address], [place], [place] [address]  Glucose, capillary     Status: Abnormal   Collection Time: 11/12/23 11:54 AM  Result Value Ref Range   Glucose-Capillary 244 (H) 70 - 99 mg/dL    Comment: Glucose reference range applies only to samples taken after fasting for at least 8 hours.  Glucose, capillary     Status: Abnormal   Collection Time: 11/12/23  3:54 PM  Result Value Ref Range   Glucose-Capillary 228 (H)  70 - 99 mg/dL    Comment: Glucose reference range applies only to samples taken after fasting for at least 8 hours.  Glucose, capillary     Status: Abnormal   Collection Time: 11/12/23  7:54 PM  Result Value Ref Range   Glucose-Capillary 210 (H) 70 - 99 mg/dL    Comment: Glucose reference range applies only to samples taken after fasting for at least 8 hours.   Comment 1 Notify RN    Comment 2 Document in Chart     Imaging / Studies: CT ABDOMEN PELVIS W CONTRAST Result Date: 11/11/2023 CLINICAL DATA:  Fever and leukocytosis with recent surgery. EXAM: CT ABDOMEN AND PELVIS WITH CONTRAST TECHNIQUE: Multidetector CT imaging of the abdomen and pelvis was performed using the standard protocol following bolus administration of intravenous contrast. RADIATION DOSE REDUCTION: This exam was performed according to the departmental dose-optimization program which includes automated exposure control, adjustment of the mA and/or kV according to patient size and/or use of iterative reconstruction technique. CONTRAST:  OMNIPAQUE IOHEXOL  300 MG/ML  SOLN COMPARISON:  CT abdomen and pelvis 11/08/2018 FINDINGS: Lower chest: There is patchy airspace consolidation in the lung bases, right greater than left, which has increased. Hepatobiliary: Mildly enlarged. No focal liver abnormality is seen. No gallstones, gallbladder wall thickening, or biliary dilatation. Pancreas: Unremarkable. No pancreatic ductal dilatation or surrounding inflammatory changes. Spleen: Mildly enlarged. Adrenals/Urinary Tract: There is a low-density indeterminate right adrenal nodule which is unchanged measuring 17 mm. The left adrenal gland is within normal limits. Bladder and distal in ureters are not well seen secondary to streak artifact in the pelvis. There is no hydronephrosis. There is mild nonspecific bilateral perinephric fat stranding. Right renal hypodensities too small to characterize, but likely cysts measuring up to 19 mm. There is likely hyperdense cyst in the superior pole left kidney measuring 17 mm, also unchanged. Stomach/Bowel: No evidence of bowel wall thickening, distention, or inflammatory changes. The appendix is not seen. There are air-fluid levels scattered throughout the colon. Nasogastric tube tip is in the body of the stomach. The stomach is nondilated. Vascular/Lymphatic: Aortic atherosclerosis. No enlarged abdominal or pelvic lymph nodes. Reproductive: Not well evaluated secondary to streak artifact in the pelvis. Other: There is no ascites. Lobulated subcutaneous fluid collection is seen in the anterior abdominal wall measuring 8.7 x 3.1 x 11.8 cm similar in size when compared to prior. Air has resolved within the fluid collection. Small amount of subcutaneous air is present, but has also decreased. There is no abdominal wall hernia. There is skin thickening, subcutaneous edema and rounded subcutaneous nodule/cyst lateral to the right hip which is unchanged. Musculoskeletal: Bilateral hip arthroplasties are present. No acute fractures are seen.  Degenerative changes affect the spine. IMPRESSION: 1. Stable size of the lobulated subcutaneous fluid collection in the anterior abdominal wall. Air has resolved within the fluid collection. Abscess is not excluded in the appropriate clinical setting. 2. Air-fluid levels scattered throughout the colon compatible with diarrheal illness. 3. Increasing patchy airspace consolidation in the lung bases, right greater than left, worrisome for pneumonia. 4. Stable indeterminate right adrenal nodule. This can be further evaluated with adrenal protocol CT or MRI. 5. Stable mild hepatosplenomegaly. 6. Stable skin thickening, subcutaneous edema and rounded subcutaneous nodule/cyst lateral to the right hip. 7. Aortic atherosclerosis. Electronically Signed   By: Tyron Gallon M.D.   On: 11/11/2023 20:37   MR BRAIN WO CONTRAST Result Date: 11/11/2023 CLINICAL DATA:  Mental status change, unknown cause EXAM: MRI  HEAD WITHOUT CONTRAST TECHNIQUE: Multiplanar, multiecho pulse sequences of the brain and surrounding structures were obtained without intravenous contrast. COMPARISON:  CT head November 08, 2023. FINDINGS: Brain: No acute infarction, hemorrhage, hydrocephalus, extra-axial collection or mass lesion. Mild scattered T2/FLAIR hyperintensities in the white matter nonspecific but compatible with chronic microvascular ischemic disease. Vascular: Major arterial flow voids are maintained at the skull base. Skull and upper cervical spine: Normal marrow signal. Sinuses/Orbits: Mild paranasal sinus mucosal thickening. No acute orbital findings. Other: Small mastoid effusions. IMPRESSION: No evidence of acute intracranial abnormality. Electronically Signed   By: Stevenson Elbe M.D.   On: 11/11/2023 20:08   DG CHEST PORT 1 VIEW Result Date: 11/11/2023 CLINICAL DATA:  Shortness of breath EXAM: PORTABLE CHEST 1 VIEW COMPARISON:  Chest radiograph dated 11/06/2023 FINDINGS: Lines/tubes: Endotracheal tube tip projects 4.8 cm above the  carina. Enteric tube tip reaches the diaphragm and terminates below the field of view. Partially imaged metal tip probe projects over the midline neck. Lungs: Low lung volumes with bronchovascular crowding. Bibasilar hazy and patchy opacities. Diffuse interstitial opacities. Pleura: Blunting of the right costophrenic angle. No definite pneumothorax. Heart/mediastinum: The heart size and mediastinal contours are within normal limits. Bones: No acute osseous abnormality. IMPRESSION: 1. Endotracheal tube tip projects 4.8 cm above the carina. 2. Bibasilar hazy and patchy opacities, which may represent atelectasis, aspiration, or pneumonia. 3. Diffuse interstitial opacities, which may represent pulmonary edema. 4. Blunting of the right costophrenic angle, which may represent a small pleural effusion. Electronically Signed   By: Limin  Xu M.D.   On: 11/11/2023 14:39     Medications / Allergies: per chart  Antibiotics: Anti-infectives (From admission, onward)    Start     Dose/Rate Route Frequency Ordered Stop   11/12/23 1400  meropenem (MERREM) 1 g in sodium chloride 0.9 % 100 mL IVPB  Status:  Discontinued        1 g 200 mL/hr over 30 Minutes Intravenous Every 8 hours 11/12/23 0756 11/12/23 2248   11/11/23 1500  linezolid (ZYVOX) IVPB 600 mg  Status:  Discontinued        600 mg 300 mL/hr over 60 Minutes Intravenous Every 12 hours 11/11/23 1413 11/12/23 2248   11/11/23 1500  meropenem (MERREM) 500 mg in sodium chloride 0.9 % 100 mL IVPB  Status:  Discontinued        500 mg 200 mL/hr over 30 Minutes Intravenous Every 8 hours 11/11/23 1413 11/12/23 0756   11/09/23 1100  rifaximin (XIFAXAN) tablet 550 mg        550 mg Per Tube 2 times daily 11/09/23 1012     11/08/23 1815  piperacillin-tazobactam (ZOSYN) IVPB 3.375 g  Status:  Discontinued        3.375 g 12.5 mL/hr over 240 Minutes Intravenous Every 8 hours 11/08/23 1722 11/10/23 0945   11/08/23 1245  Ampicillin-Sulbactam (UNASYN) 3 g in sodium  chloride 0.9 % 100 mL IVPB  Status:  Discontinued        3 g 200 mL/hr over 30 Minutes Intravenous Every 6 hours 11/08/23 1157 11/08/23 1717   11/05/23 0800  piperacillin-tazobactam (ZOSYN) IVPB 3.375 g  Status:  Discontinued        3.375 g 12.5 mL/hr over 240 Minutes Intravenous Every 8 hours 11/05/23 0747 11/08/23 1151   10/31/23 2000  ceFAZolin (ANCEF) IVPB 1 g/50 mL premix        1 g 100 mL/hr over 30 Minutes Intravenous Every 8 hours 10/31/23 1606 11/01/23 0449  10/31/23 0600  ceFAZolin (ANCEF) IVPB 2g/100 mL premix  Status:  Discontinued        2 g 200 mL/hr over 30 Minutes Intravenous On call to O.R. 10/31/23 0536 10/31/23 0538   10/31/23 0536  ceFAZolin (ANCEF) IVPB 2g/100 mL premix        2 g 200 mL/hr over 30 Minutes Intravenous 30 min pre-op 10/31/23 0536 10/31/23 1127         Note: Portions of this report may have been transcribed using voice recognition software. Every effort was made to ensure accuracy; however, inadvertent computerized transcription errors may be present.   Any transcriptional errors that result from this process are unintentional.    Eddye Goodie, MD, FACS, MASCRS Esophageal, Gastrointestinal & Colorectal Surgery Robotic and Minimally Invasive Surgery  [facility] [address] [place], [place] [address] [phone] Fax [phone] Main  CONTACT INFORMATION: Weekday (9AM-5PM): Call [facility] main office at [phone] Weeknight (5PM-9AM) or Weekend/Holiday: Check EPIC "Web Links" tab & use "AMION" (password " [email or web address]") for General Surgery [facility] coverage  Please, DO NOT use SecureChat  (it is not reliable communication to reach operating surgeons & will lead to a delay in care).   Epic staff messaging available for outptient concerns needing 1-2 business day response.      11/13/2023  8:04 AM

## 2023-11-13 NOTE — Plan of Care (Signed)
  Problem: Coping: Goal: Level of anxiety will decrease Outcome: Progressing   Problem: Pain Managment: Goal: General experience of comfort will improve and/or be controlled Outcome: Progressing   Problem: Health Behavior/Discharge Planning: Goal: Ability to manage health-related needs will improve Outcome: Not Progressing   Problem: Nutrition: Goal: Adequate nutrition will be maintained Outcome: Not Progressing   Problem: Coping: Goal: Ability to adjust to condition or change in health will improve Outcome: Not Progressing

## 2023-11-13 NOTE — TOC Progression Note (Signed)
 Transition of Care [facility]) - Progression Note    Patient Details  Name: Preston [name] MRN: [id number] Date of Birth: October 16, 1970  [facility] CM/SW Contact  Delilah Fend, LCSW Phone Number: 11/13/2023, 11:24 AM  Clinical Narrative:     TOC continues to monitor for needs. Noted pt now extubated for comfort and PMT following closely.     Expected Discharge Plan: (P) Home/Self Care Barriers to Discharge: Continued Medical Work up  Expected Discharge Plan and Services         Expected Discharge Date: 11/02/23                                     Social Determinants of Health (SDOH) Interventions SDOH Screenings   Food Insecurity: No Food Insecurity (11/01/2023)  Recent Concern: Food Insecurity - Food Insecurity Present (10/31/2023)  Housing: Low Risk  (10/31/2023)  Transportation Needs: No Transportation Needs (10/31/2023)  Utilities: Not At Risk (10/31/2023)  Alcohol Screen: Low Risk  (07/11/2023)  Depression (PHQ2-9): Low Risk  (10/14/2023)  Financial Resource Strain: High Risk (07/11/2023)  Physical Activity: Unknown (07/11/2023)  Social Connections: Unknown (07/11/2023)  Stress: Stress Concern Present (07/11/2023)  Tobacco Use: High Risk (11/04/2023)    Readmission Risk Interventions     No data to display

## 2023-11-13 NOTE — Progress Notes (Signed)
   11/13/23 1632  Spiritual Encounters  Type of Visit Follow up  Care provided to: Patient  Reason for visit Routine spiritual support  OnCall Visit No   Following up on previous visit, Patient extubated for comfort and being followed closely. Patient appears to be comfortable. Family stepped out. Spoke with nurse and received update.

## 2023-11-13 NOTE — Progress Notes (Addendum)
 eLink Physician-Brief Progress Note Patient Name: AZARION HOVE DOB: 1971-04-15 MRN: [id number]   Date of Service  11/13/2023  HPI/Events of Note  Patient extubated to comfort care in the setting of recent nephrectomy and ongoing agitation with respiratory failure.  Has a fever and unable to take oral intake.  eICU Interventions  Add acetaminophen IV.   0539 -repeat acetaminophen IV in the setting of fever, on movements with his head and neck.  Intervention Category Minor Interventions: Routine modifications to care plan (e.g. PRN medications for pain, fever)  Elizah Lydon 11/13/2023, 1:05 AM

## 2023-11-13 NOTE — Progress Notes (Signed)
 PHARMACY - PHYSICIAN COMMUNICATION CRITICAL VALUE ALERT - BLOOD CULTURE IDENTIFICATION (BCID)  Preston [name] is an 53 y.o. male who presented to [facility] on 10/31/2023 with a chief complaint of hernia  Assessment: 1/4 staph epi, MecA+  Name of physician (or Provider) Contacted: [place]  Current antibiotics: none  Changes to prescribed antibiotics recommended:  None, probable contaminant  Results for orders placed or performed during the hospital encounter of 10/31/23  Blood Culture ID Panel (Reflexed) (Collected: 11/11/2023  4:49 PM)  Result Value Ref Range   Enterococcus faecalis NOT DETECTED NOT DETECTED   Enterococcus Faecium NOT DETECTED NOT DETECTED   Listeria monocytogenes NOT DETECTED NOT DETECTED   Staphylococcus species DETECTED (A) NOT DETECTED   Staphylococcus aureus (BCID) NOT DETECTED NOT DETECTED   Staphylococcus epidermidis DETECTED (A) NOT DETECTED   Staphylococcus lugdunensis NOT DETECTED NOT DETECTED   Streptococcus species NOT DETECTED NOT DETECTED   Streptococcus agalactiae NOT DETECTED NOT DETECTED   Streptococcus pneumoniae NOT DETECTED NOT DETECTED   Streptococcus pyogenes NOT DETECTED NOT DETECTED   A.calcoaceticus-baumannii NOT DETECTED NOT DETECTED   Bacteroides fragilis NOT DETECTED NOT DETECTED   Enterobacterales NOT DETECTED NOT DETECTED   Enterobacter cloacae complex NOT DETECTED NOT DETECTED   Escherichia coli NOT DETECTED NOT DETECTED   Klebsiella aerogenes NOT DETECTED NOT DETECTED   Klebsiella oxytoca NOT DETECTED NOT DETECTED   Klebsiella pneumoniae NOT DETECTED NOT DETECTED   Proteus species NOT DETECTED NOT DETECTED   Salmonella species NOT DETECTED NOT DETECTED   Serratia marcescens NOT DETECTED NOT DETECTED   Haemophilus influenzae NOT DETECTED NOT DETECTED   Neisseria meningitidis NOT DETECTED NOT DETECTED   Pseudomonas aeruginosa NOT DETECTED NOT DETECTED   Stenotrophomonas maltophilia NOT DETECTED NOT DETECTED   Candida albicans  NOT DETECTED NOT DETECTED   Candida auris NOT DETECTED NOT DETECTED   Candida glabrata NOT DETECTED NOT DETECTED   Candida krusei NOT DETECTED NOT DETECTED   Candida parapsilosis NOT DETECTED NOT DETECTED   Candida tropicalis NOT DETECTED NOT DETECTED   Cryptococcus neoformans/gattii NOT DETECTED NOT DETECTED   Methicillin resistance mecA/C DETECTED (A) NOT DETECTED    Beau Bound RPh 11/13/2023, 2:06 AM

## 2023-11-13 NOTE — Progress Notes (Signed)
 NAME:  Preston [name], MRN:  [id number], DOB:  19-Jan-1971, LOS: 11 ADMISSION DATE:  10/31/2023, CONSULTATION DATE:  11/04/2023 REFERRING MD:  Dr. Lanae Boast - [facility], CHIEF COMPLAINT:  Hypoxia and concern for alcohol    History of Present Illness:  Preston [name] is a 53 y.o. with a past medical history significant for left partial nephrectomy for renal cell carcinoma, abdominal incisional hernia, TN, HLD, CHF, COPD, and GERD who presented for elective abdominal incision hernia repair with combination right robotic assisted laparoscopic partial nephrectomy.  Per chart review it appears patient tolerated procedures well.    Postop day 1 patient was seen with mild hypoxia with SpO2 84% on room air this was felt due to splinting secondary to pain and pulmonary hygiene was encouraged.  Hospitalist service was consulted 4/5 for persistent hypoxemia.   By 4/7 hypoxemia continued to persistent with AMS as well, concerning for ETOH withdrawal, PCCM consulted   Pertinent  Medical History  Left partial nephrectomy for renal cell carcinoma, abdominal incisional hernia, TN, HLD, CHF, COPD, and GERD  Significant Hospital Events: Including procedures, antibiotic start and stop dates in addition to other pertinent events   4/3 admitted for elective hernia repair and partial right nephrectomy  4/4 hypoxic on RA  4/5 [facility] consulted  4/7 Signs of alcohol withdrawal and AMS, intubated overnight for increased work of breathing 4/8 high Fio2/PEEP needs. PNA vs edema. Met ARDS criteria. Zosyn started and diuresed.  4/9: weaned to minimal support, became hypoxic and back to 50% and 10 PEEP 4/11 unresponsive on ventilator this a.m with upward forced gaze., continuous sedation stopped.  Within about an hour patient began spontaneously moving during deep suctioning but upward forced gaze persist.  Head CT and Spot EEG pending 4/14 discussion with spouse and daughter at bedside-clarification that he will not want to be kept  on artificial support long-term, will not want a tracheostomy 4/14-MRI brain with no acute finding, CT abdomen with contrast with what appears to be resolving hematoma, no signs of abscess or infection 4/15 -extubated to comfort  Interim History / Subjective:  Extubated to comfort measures Remains encephalopathic Appears to be coughing and protecting airway, will need suction once he brings up secretions Not redirectable, not following commands Febrile 103 On propofol, fentanyl, ketamine  Objective   Blood pressure (!) 183/86, pulse (!) 135, temperature (!) 103.1 F (39.5 C), temperature source Oral, resp. rate 14, height 6\' 3"  (1.905 m), weight 125.6 kg, SpO2 92%.    Vent Mode: PRVC FiO2 (%):  [4 %-40 %] 40 % Set Rate:  [22 bmp] 22 bmp Vt Set:  [500 mL] 500 mL PEEP:  [5 cmH20] 5 cmH20 Pressure Support:  [5 cmH20] 5 cmH20 Plateau Pressure:  [16 cmH20-20 cmH20] 16 cmH20   Intake/Output Summary (Last 24 hours) at 11/13/2023 0816 Last data filed at 11/13/2023 9811 Gross per 24 hour  Intake 2975.6 ml  Output 3600 ml  Net -624.4 ml   Filed Weights   11/11/23 0702 11/12/23 0420 11/13/23 0500  Weight: 129.7 kg 127.2 kg 125.6 kg   Examination:  Gen:   Does not appear to be in distress, writhing around in bed HEENT: Moist oral mucosa lungs:    Coarse breath sounds Neuro:   Not following commands, moving all extremities  I reviewed nursing notes, Consultant notes, hospitalist notes, last 24 h vitals and pain scores, last 48 h intake and output, last 24 h labs and trends, and last 24 h imaging results.  Resolved Hospital Problem list     Assessment & Plan:   Acute on chronic respiratory failure COPD with exacerbation Aspiration pneumonia versus lung injury Right renal mass status post partial nephrectomy Abdominal wall hernia repair Type 2 diabetes Persistent encephalopathy Chronic diastolic heart failure with ejection fraction of 50-55% Fevers  Following ongoing goals  of care discussion, with his persistent encephalopathy despite ongoing aggressive care, decision was made to compassionately extubate and comfort measures in place  Discussed with spouse at bedside  Appreciate palliative care involvement  All medications but for comfort measures are on hold at present  Myer Artis, MD [place] [facility] Pager: See Tilford Foley

## 2023-11-13 NOTE — Addendum Note (Signed)
 Addendum  created 11/13/23 1226 by Rosalita Combe, MD   Intraprocedure Staff edited

## 2023-11-13 NOTE — Progress Notes (Signed)
 Patient ID: Preston [name], male   DOB: Mar 04, 1971, 53 y.o.   MRN: [id number]  13 Days Post-Op Subjective: Family elected to proceed with extubation and comfort care yesterday.  He was extubated last evening and has been stable aside from tachycardia.  Still with head movements but no coherent responses. Febrile to 103.  Objective: Vital signs in last 24 hours: Temp:  [99 F (37.2 C)-103.1 F (39.5 C)] 103.1 F (39.5 C) (04/16 0700) Pulse Rate:  [76-137] 130 (04/16 0800) Resp:  [9-27] 12 (04/16 0800) BP: (109-219)/(60-121) 197/81 (04/16 0800) SpO2:  [88 %-100 %] 93 % (04/16 0800) FiO2 (%):  [4 %-40 %] 40 % (04/15 2037) Weight:  [125.6 kg] 125.6 kg (04/16 0500)  Intake/Output from previous day: 04/15 0701 - 04/16 0700 In: 3308.7 [I.V.:1410.8; NG/GT:1208; IV Piggyback:689.8] Out: 3600 [Urine:3600] Intake/Output this shift: Total I/O In: 109.8 [I.V.:66.5; NG/GT:43.3] Out: -   Physical Exam:  General: Comfortably resting Abd: Soft and ND Inc: Clean and dry  Lab Results: Recent Labs    11/12/23 0245  HGB 9.8*  HCT 33.4*      Latest Ref Rng & Units 11/12/2023    2:45 AM 11/10/2023    2:51 AM 11/08/2023    3:02 AM  CBC  WBC 4.0 - 10.5 K/uL 14.9  18.0  15.2   Hemoglobin 13.0 - 17.0 g/dL 9.8  40.9  81.1   Hematocrit 39.0 - 52.0 % 33.4  36.8  35.3   Platelets 150 - 400 K/uL 312  256  315      BMET Recent Labs    11/12/23 0051  NA 146*  K 3.4*  CL 110  CO2 27  GLUCOSE 181*  BUN 90*  CREATININE 1.40*  CALCIUM 8.7*     Studies/Results:   Assessment/Plan: POD # 13 s/p right RAL partial nephrectomy and incisional hernia repair with mesh - Appreciate pulmonary/critical care management and palliative care involvement.  Pt's spouse and family very realistic of prognosis and comfortable with palliative care approach.  Will continue to follow.   LOS: 11 days   Kristeen Peto 11/13/2023, 9:08 AM

## 2023-11-13 NOTE — Progress Notes (Signed)
 Nutrition Brief Note  Chart reviewed. Pt now transitioning to comfort care.  No further nutrition interventions planned at this time.  Please re-consult as needed.   Shelle Iron RD, LDN Contact via [employer].

## 2023-11-13 NOTE — Plan of Care (Signed)
  Problem: Health Behavior/Discharge Planning: Goal: Ability to manage health-related needs will improve Outcome: Progressing   Problem: Education: Goal: Knowledge of General Education information will improve Description: Including pain rating scale, medication(s)/side effects and non-pharmacologic comfort measures Outcome: Not Progressing   Problem: Clinical Measurements: Goal: Ability to maintain clinical measurements within normal limits will improve Outcome: Not Progressing

## 2023-11-13 NOTE — Progress Notes (Signed)
 DO at bedside to extubate patient. Titrated IV meds by verbal order to keep patient comfortable during extubation.

## 2023-11-14 LAB — CULTURE, BLOOD (ROUTINE X 2): Special Requests: ADEQUATE

## 2023-11-14 MED ORDER — MIDAZOLAM HCL 2 MG/2ML IJ SOLN
2.0000 mg | INTRAMUSCULAR | Status: DC | PRN
  Administered 2023-11-14 (×3): 2 mg via INTRAVENOUS
  Filled 2023-11-14 (×3): qty 2

## 2023-11-14 NOTE — Progress Notes (Signed)
 Not much to add from a PCCM perspective  Patient on comfort measures  Discussed with Dr. Glady Laming

## 2023-11-14 NOTE — Progress Notes (Signed)
 11/14/2023  Preston Preston [name] [id number] 02/28/1971  CARE TEAM: PCP: Preston Dean, NP  Outpatient Care Team: Patient Care Team: Preston Dean, NP as PCP - General (Nurse Practitioner) Preston Madura, MD as PCP - Cardiology (Cardiology) Preston Hilts, MD as Consulting Physician (Gastroenterology) Preston Champagne, MD as Consulting Physician (General Surgery) Preston Hunting, MD as Consulting Physician (Urology)  Inpatient Treatment Team: Treatment Team:  Preston Hunting, MD Preston Champagne, MD Preston Preston [name], Preston Battiest, MD Pccm, Md, MD Rafe Bunde, DO [place], Thane Finch, RN [place], Smitty Dunker, NT Abbe Abate, RN [place], Erskin Hearing, [place] Sheppard Dickinson, RN Kathaleen Pale, RN   Problem List:   Principal Problem:   Neoplasm of right kidney Active Problems:   Tobacco abuse   HLD (hyperlipidemia)   Alcohol abuse   Chronic diastolic congestive heart failure (HCC)   Gastroesophageal reflux disease without esophagitis   Type 2 diabetes mellitus treated without insulin (HCC)   Essential hypertension   Acute kidney injury (HCC)   Obesity, Class II, BMI 35-39.9   Diastasis recti   Incarcerated incisional hernia s/p repair w mesh 10/31/2023   History of Left Renal cell cancer   Hyponatremia   Acute hypoxemic respiratory failure (HCC)   Alcohol withdrawal delirium (HCC)   ABLA (acute blood loss anemia)   Primary hypertension   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  Swiss cheese hernias going down infraumbilically connecting with his  prior low midline incision as well.  Umbilical hernia.   Type of ventral wall repair:  Laparoscopic underlay repair .with Primary repair of largest hernia Placement of mesh: Centrally intraperitoneal with edges tucked into RECTRORECTUS & preperitoneal space Name of mesh: Bard Ventralight dual sided (polypropylene / Seprafilm) Size of mesh: 33x27cm Orientation: Vertical Mesh overlap:  5-7cm  10/31/2023   Postoperative diagnosis: Right renal neoplasm   Procedure:   Right robotic-assisted laparoscopic partial nephrectomy Intraoperative renal ultrasonography   Surgeon: Preston Preston [name]. M.D.   Intraoperative findings:       1. Warm renal ischemia time: 22 minutes       2. Intraoperative renal ultrasound findings: There was a small cystic appearing mass that measured about 1 cm adjacent to a larger renal cyst.   Drains: # 15 Blake perinephric drain  FINAL MICROSCOPIC DIAGNOSIS:  A. 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 PERINEPHRIC FAT, EXCISION: Benign fibroadipose tissue  C. HERNIA SAC: Benign fibromuscular and adipose tissue     Assessment Preston Preston [name] = 12 days) 14 Days Post-Op    Persistent delirium and confusion post-op most likely Preston [name] metabolic encephalopathy  Plan:  Decision made for complete withdrawal.  Palliative care leading.  Postoperative hematoma seroma drained yesterday with cultures negative.  If mentally recovers, consider feeding & therapy but sounds like they are more aggressively transitioning to comfort care only.  Surgery will be available but defer to palliative & family wishes    I reviewed nursing notes, last 52  h vitals and pain scores, last 48 h intake and output, last 24 h labs and trends, and last 24 h imaging results.  I have reviewed this patient's available data, including medical history, events of note, test results, etc as  part of my evaluation.   A significant portion of that time was spent in counseling. Care during the described time interval was provided by me.  This care required moderate level of medical decision making.  11/14/2023    Subjective: (Chief complaint)  More aggressive withdrawal done.  Patient snoring in bed.  Not very interactive.    Objective:  Vital signs:  Vitals:   11/13/23 0631 11/13/23 0700 11/13/23 0800 11/14/23 0800  BP: (!) 149/80 (!) 183/86 (!) 197/81   Pulse: (!) 132 (!) 135 (!) 130 (!) 107  Resp: 17 14 12 12   Temp:  (!) 103.1 F (39.5 C)    TempSrc:  Oral    SpO2: 90% 92% 93% (!) 83%  Weight:      Height:        Last BM Date : 11/12/23  Intake/Output   Yesterday:  04/16 0701 - 04/17 0700 In: 1622.3 [I.V.:1578.9; NG/GT:43.3] Out: 2100 [Urine:2100] This shift:  Total I/O In: 253.6 [I.V.:253.6] Out: -   Bowel function:  Flatus: No  BM:  YES  Drain: (No drain)   Physical Exam: Clinical ICU nurse team & Dr Laverle Patter in room with me  General: Pt extubated.  Does open eyes to voice and touch but not consistently following commands.  More calm today Neuro: No focal sensory/motor deficits.  Not consistently following commands but moving hands and toes intermittently Lymph: No head/neck/groin lymphadenopathy Psych: Sedated at this point.  No agitation/delirium  HENT: Normocephalic, Mucus membranes moist.  No thrush.  Orogastric tube just placed. Neck: Supple, No tracheal deviation.  No obvious thyromegaly Chest: No pain to chest wall compression.  Good respiratory excursion.  No audible wheezing but using abdominal wall to breathe.   CV:  Pulses intact.  Regular rhythm.  No major extremity edema  Abdomen: Obese Soft.  Nondistended.  Nontender.  Ecchymosis abdominal stable and slowly resolving.   Incisions clean dry intact.  Incision remains closed without cellulitis nor drainage.  No fluctuance.  No guarding or peritonitis.  Argues against infection.       Results:    SURGICAL PATHOLOGY CASE: [id number] PATIENT: Preston Preston [name] Surgical Pathology Report     Clinical History: Right renal mass, ventral incisional incarcerated hernia (jlr)     FINAL MICROSCOPIC DIAGNOSIS:  A. 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 PERINEPHRIC FAT, EXCISION: Benign fibroadipose tissue  C. HERNIA SAC: Benign fibromuscular and adipose tissue     Bradie Sangiovanni DESCRIPTION:  A: Specimen is received fresh and consists of a 4.7 x 2.8 x 1.7 cm piece of tan soft tissue, with a small amount of overlying tan-yellow adipose tissue.  Resection margin is inked black, specimen is serially sectioned reveal tan parenchyma.  There is a 1.0 x 1.0 x 0.9 cm tan-white, solid, well-circumscribed lesion identified.  Lesion abuts the outer capsule, and measures 0.2 cm to the inked resection margin.  A 1.1 x 1.0 x 0.9 cm cortical cyst is identified.  No other lesions are identified. Representative sections are submitted in 5 cassettes. 1-3 = tan-white lesion 4 and 5 = cortical cyst  B: Specimen is received fresh, and consists of a 4.1  x 2.8 x 1.3 cm portion of tan-yellow adipose tissue.  Sectioning reveals grossly unremarkable cut surfaces.  Representative sections are submitted in 1 cassette.  C: Specimen is received fresh and consists of an 8.0 x 7.1 x 2.3 cm aggregate of tan-pink fibromembranous tissue and attached adipose tissue.  Sectioning reveals focally fibrotic cut surfaces. Representative sections are submitted in 3 cassettes.  Preston Preston [name] 11/01/2023)   Final Diagnosis performed by Marlena Clipper, MD.   Electronically signed 11/04/2023 Technical and / or Professional components performed at [facility], [address], [place], [place] [address].  Immunohistochemistry Technical component (if applicable) was performed at [facility]. [address], [place], [place] [address].   IMMUNOHISTOCHEMISTRY DISCLAIMER (if applicable): Some of these immunohistochemical stains may have been developed and the performance characteristics determine by [facility]. Some may not have been cleared or approved by the U.S. Food and Drug Administration. The FDA has determined that such clearance or approval is not necessary. This test is used for clinical purposes. It should not be regarded as investigational or for research. This laboratory is certified under the Clinical Laboratory Improvement Amendments of 1988 (CLIA-88) as qualified to perform high complexity clinical laboratory testing.  The controls stained appropriately.   IHC stains are performed on formalin fixed, paraffin embedded tissue using a 3,3"diaminobenzidine (DAB) chromogen and Leica Bond Autostainer System. The staining intensity of the nucleus is score manually and is reported as the percentage of tumor cell nuclei demonstrating specific nuclear staining. The specimens are fixed in 10% Neutral Formalin for at least 6 hours and up to 72hrs. These tests are validated on decalcified tissue. Results should be interpreted with caution given the possibility of false negative results on decalcified specimens. Antibody Clones are as follows ER-clone 94F, PR-clone 16, Ki67- clone MM1. Some of these immunohistochemical stains may have been developed and the performance characteristics determined by [facility].    Cultures: Recent Results (from the past 720 hours)  MRSA Next Gen by PCR, Nasal     Status: None   Collection Time: 11/04/23  6:08 PM   Specimen: Nasal Mucosa; Nasal Swab  Result Value Ref Range Status   MRSA by PCR Next Gen NOT DETECTED NOT DETECTED Final    Comment: (NOTE) The GeneXpert MRSA Assay (FDA approved for NASAL specimens only), is one component of a comprehensive MRSA colonization  surveillance program. It is not intended to diagnose MRSA infection nor to guide or monitor treatment for MRSA infections. Test performance is not FDA approved in patients less than 28 years old. Performed at [facility], [address], [place], [place] [address]   Culture, blood (Routine X 2) w Reflex to ID Panel     Status: None   Collection Time: 11/05/23  8:22 AM   Specimen: BLOOD  Result Value Ref Range Status   Specimen Description   Final    BLOOD BLOOD RIGHT ARM AEROBIC BOTTLE ONLY ANAEROBIC BOTTLE ONLY Performed at [facility], [address] [place], [place] [address]    Special Requests   Final    BOTTLES DRAWN AEROBIC AND ANAEROBIC Blood Culture results may not be optimal Preston [name] to an inadequate volume of blood received in culture bottles Performed at [facility], [address] [place], [place] [address]    Culture   Final    NO GROWTH 5 DAYS Performed at [facility], [address] [place], [place] [address]  Report Status 11/10/2023 FINAL  Final  Culture, blood (Routine X 2) w Reflex to ID Panel     Status: None   Collection Time: 11/05/23  8:22 AM   Specimen: BLOOD  Result Value Ref Range Status   Specimen Description   Final    BLOOD BLOOD RIGHT HAND AEROBIC BOTTLE ONLY ANAEROBIC BOTTLE ONLY Performed at [facility], [address] [place], [place] [address]    Special Requests   Final    BOTTLES DRAWN AEROBIC AND ANAEROBIC Blood Culture adequate volume Performed at [facility], [address], [place], [place] [address]    Culture   Final    NO GROWTH 5 DAYS Performed at [facility], [address], [place], [place] [address]    Report Status 11/10/2023 FINAL  Final  Culture, Respiratory w Gram Stain     Status: None   Collection Time: 11/05/23  9:56 AM   Specimen: SPU; Respiratory  Result Value Ref Range Status   Specimen Description    Final    SPUTUM Performed at [facility], [address] [place], [place] [address]    Special Requests   Final    SPUTUM Performed at [facility], [address], [place], [place] [address]    Gram Stain   Final    NO WBC SEEN FEW GRAM POSITIVE COCCI IN PAIRS IN CHAINS    Culture   Final    RARE Normal respiratory flora-no Staph aureus or Pseudomonas seen Performed at [facility], [address], [place], [place] [address]    Report Status 11/07/2023 FINAL  Final  Culture, blood (Routine X 2) w Reflex to ID Panel     Status: None (Preliminary result)   Collection Time: 11/11/23  4:48 PM   Specimen: BLOOD LEFT HAND  Result Value Ref Range Status   Specimen Description   Final    BLOOD LEFT HAND Performed at [facility], [address], [place], [place] [address]    Special Requests   Final    BOTTLES DRAWN AEROBIC AND ANAEROBIC Blood Culture results may not be optimal Preston [name] to an inadequate volume of blood received in culture bottles Performed at [facility], [address], [place], [place] [address]    Culture   Final    NO GROWTH 3 DAYS Performed at [facility], [address], [place], [place] [address]    Report Status PENDING  Incomplete  Culture, blood (Routine X 2) w Reflex to ID Panel     Status: Abnormal   Collection Time: 11/11/23  4:49 PM   Specimen: BLOOD RIGHT HAND  Result Value Ref Range Status   Specimen Description   Final    BLOOD RIGHT HAND Performed at [facility], [address], [place], [place] [address]    Special Requests   Final    BOTTLES DRAWN AEROBIC AND ANAEROBIC Blood Culture adequate volume Performed at [facility], [address], [place], [place] [address]    Culture  Setup Time   Final    GRAM POSITIVE COCCI IN CLUSTERS AEROBIC BOTTLE ONLY Organism ID to follow CRITICAL RESULT CALLED TO, READ BACK BY AND VERIFIED WITH: E  JACKSON,[email or web address]  11/13/23 MK    Culture (A)  Final    STAPHYLOCOCCUS EPIDERMIDIS THE SIGNIFICANCE OF ISOLATING THIS ORGANISM FROM A SINGLE SET OF BLOOD CULTURES WHEN MULTIPLE SETS ARE DRAWN  IS UNCERTAIN. PLEASE NOTIFY THE MICROBIOLOGY DEPARTMENT WITHIN ONE WEEK IF SPECIATION AND SENSITIVITIES ARE REQUIRED. Performed at [facility], [address] [place], [place] [address]    Report Status 11/14/2023 FINAL  Final  Blood Culture ID Panel (Reflexed)     Status: Abnormal   Collection Time: 11/11/23  4:49 PM  Result Value Ref Range Status   Enterococcus faecalis NOT DETECTED NOT DETECTED Final   Enterococcus Faecium NOT DETECTED NOT DETECTED Final   Listeria monocytogenes NOT DETECTED NOT DETECTED Final   Staphylococcus species DETECTED (A) NOT DETECTED Final    Comment: CRITICAL RESULT CALLED TO, READ BACK BY AND VERIFIED WITH: E JACKSON,[email or web address]  11/13/23 MK    Staphylococcus aureus (BCID) NOT DETECTED NOT DETECTED Final   Staphylococcus epidermidis DETECTED (A) NOT DETECTED Final    Comment: Methicillin (oxacillin) resistant coagulase negative staphylococcus. Possible blood culture contaminant (unless isolated from more than one blood culture draw or clinical case suggests pathogenicity). No antibiotic treatment is indicated for blood  culture contaminants.    Staphylococcus lugdunensis NOT DETECTED NOT DETECTED Final   Streptococcus species NOT DETECTED NOT DETECTED Final   Streptococcus agalactiae NOT DETECTED NOT DETECTED Final   Streptococcus pneumoniae NOT DETECTED NOT DETECTED Final   Streptococcus pyogenes NOT DETECTED NOT DETECTED Final   A.calcoaceticus-baumannii NOT DETECTED NOT DETECTED Final   Bacteroides fragilis NOT DETECTED NOT DETECTED Final   Enterobacterales NOT DETECTED NOT DETECTED Final   Enterobacter cloacae complex NOT DETECTED NOT DETECTED Final   Escherichia coli NOT DETECTED NOT DETECTED Final   Klebsiella aerogenes NOT DETECTED NOT DETECTED  Final   Klebsiella oxytoca NOT DETECTED NOT DETECTED Final   Klebsiella pneumoniae NOT DETECTED NOT DETECTED Final   Proteus species NOT DETECTED NOT DETECTED Final   Salmonella species NOT DETECTED NOT DETECTED Final   Serratia marcescens NOT DETECTED NOT DETECTED Final   Haemophilus influenzae NOT DETECTED NOT DETECTED Final   Neisseria meningitidis NOT DETECTED NOT DETECTED Final   Pseudomonas aeruginosa NOT DETECTED NOT DETECTED Final   Stenotrophomonas maltophilia NOT DETECTED NOT DETECTED Final   Candida albicans NOT DETECTED NOT DETECTED Final   Candida auris NOT DETECTED NOT DETECTED Final   Candida glabrata NOT DETECTED NOT DETECTED Final   Candida krusei NOT DETECTED NOT DETECTED Final   Candida parapsilosis NOT DETECTED NOT DETECTED Final   Candida tropicalis NOT DETECTED NOT DETECTED Final   Cryptococcus neoformans/gattii NOT DETECTED NOT DETECTED Final   Methicillin resistance mecA/C DETECTED (A) NOT DETECTED Final    Comment: CRITICAL RESULT CALLED TO, READ BACK BY AND VERIFIED WITH: E JACKSON,[email or web address]  11/13/23 MK Performed at [facility], [address], [place], [place] [address]   Body fluid culture w Gram Stain     Status: None (Preliminary result)   Collection Time: 11/13/23  8:45 AM   Specimen: NEEDLE ASPIRATE; Body Fluid  Result Value Ref Range Status   Specimen Description   Final    NEEDLE ASPIRATE Performed at [facility], [address], [place], [place] [address]    Special Requests   Final    Normal Performed at [facility], [address] [place], [place] [address]    Gram Stain   Final    NO WBC SEEN NO ORGANISMS SEEN Performed at [facility], [address], [place], [place] [address]    Culture PENDING  Incomplete   Report Status PENDING  Incomplete    Labs: Results for orders placed  or performed during the hospital encounter of 10/31/23 (from the past 48 hours)  Glucose, capillary      Status: Abnormal   Collection Time: 11/12/23 11:54 AM  Result Value Ref Range   Glucose-Capillary 244 (H) 70 - 99 mg/dL    Comment: Glucose reference range applies only to samples taken after fasting for at least 8 hours.  Glucose, capillary     Status: Abnormal   Collection Time: 11/12/23  3:54 PM  Result Value Ref Range   Glucose-Capillary 228 (H) 70 - 99 mg/dL    Comment: Glucose reference range applies only to samples taken after fasting for at least 8 hours.  Glucose, capillary     Status: Abnormal   Collection Time: 11/12/23  7:54 PM  Result Value Ref Range   Glucose-Capillary 210 (H) 70 - 99 mg/dL    Comment: Glucose reference range applies only to samples taken after fasting for at least 8 hours.   Comment 1 Notify RN    Comment 2 Document in Chart   Body fluid culture w Gram Stain     Status: None (Preliminary result)   Collection Time: 11/13/23  8:45 AM   Specimen: NEEDLE ASPIRATE; Body Fluid  Result Value Ref Range   Specimen Description      NEEDLE ASPIRATE Performed at [facility], [address], [place], [place] [address]    Special Requests      Normal Performed at [facility], [address], [place], [place] [address]    Gram Stain      NO WBC SEEN NO ORGANISMS SEEN Performed at [facility], [address] [place], [place] [address]    Culture PENDING    Report Status PENDING     Imaging / Studies: No results found.    Medications / Allergies: per chart  Antibiotics: Anti-infectives (From admission, onward)    Start     Dose/Rate Route Frequency Ordered Stop   11/12/23 1400  meropenem (MERREM) 1 g in sodium chloride 0.9 % 100 mL IVPB  Status:  Discontinued        1 g 200 mL/hr over 30 Minutes Intravenous Every 8 hours 11/12/23 0756 11/12/23 2248   11/11/23 1500  linezolid (ZYVOX) IVPB 600 mg  Status:  Discontinued        600 mg 300 mL/hr over 60 Minutes Intravenous Every 12 hours 11/11/23 1413  11/12/23 2248   11/11/23 1500  meropenem (MERREM) 500 mg in sodium chloride 0.9 % 100 mL IVPB  Status:  Discontinued        500 mg 200 mL/hr over 30 Minutes Intravenous Every 8 hours 11/11/23 1413 11/12/23 0756   11/09/23 1100  rifaximin (XIFAXAN) tablet 550 mg        550 mg Per Tube 2 times daily 11/09/23 1012     11/08/23 1815  piperacillin-tazobactam (ZOSYN) IVPB 3.375 g  Status:  Discontinued        3.375 g 12.5 mL/hr over 240 Minutes Intravenous Every 8 hours 11/08/23 1722 11/10/23 0945   11/08/23 1245  Ampicillin-Sulbactam (UNASYN) 3 g in sodium chloride 0.9 % 100 mL IVPB  Status:  Discontinued        3 g 200 mL/hr over 30 Minutes Intravenous Every 6 hours 11/08/23 1157 11/08/23 1717   11/05/23 0800  piperacillin-tazobactam (ZOSYN) IVPB 3.375 g  Status:  Discontinued        3.375 g 12.5 mL/hr over 240 Minutes Intravenous Every 8 hours  11/05/23 0747 11/08/23 1151   10/31/23 2000  ceFAZolin (ANCEF) IVPB 1 g/50 mL premix        1 g 100 mL/hr over 30 Minutes Intravenous Every 8 hours 10/31/23 1606 11/01/23 0449   10/31/23 0600  ceFAZolin (ANCEF) IVPB 2g/100 mL premix  Status:  Discontinued        2 g 200 mL/hr over 30 Minutes Intravenous On call to O.R. 10/31/23 0536 10/31/23 0538   10/31/23 0536  ceFAZolin (ANCEF) IVPB 2g/100 mL premix        2 g 200 mL/hr over 30 Minutes Intravenous 30 min pre-op 10/31/23 0536 10/31/23 1127         Note: Portions of this report may have been transcribed using voice recognition software. Every effort was made to ensure accuracy; however, inadvertent computerized transcription errors may be present.   Any transcriptional errors that result from this process are unintentional.    Preston Goodie, MD, FACS, MASCRS Esophageal, Gastrointestinal & Colorectal Surgery Robotic and Minimally Invasive Surgery  [facility] [address] [place], [place] [address] [phone] Fax [phone] Main  CONTACT INFORMATION: Weekday (9AM-5PM): Call [facility] main office at [phone] Weeknight (5PM-9AM) or Weekend/Holiday: Check EPIC "Web Links" tab & use "AMION" (password " [email or web address]") for General Surgery [facility] coverage  Please, DO NOT use SecureChat  (it is not reliable communication to reach operating surgeons & will lead to a delay in care).   Epic staff messaging available for outptient concerns needing 1-2 business day response.      11/14/2023  9:27 AM

## 2023-11-14 NOTE — Progress Notes (Signed)
   11/14/23 1325  Spiritual Encounters  Type of Visit Initial  Care provided to: Family  Reason for visit End-of-life   While rounding on unit, I noted Preston [name] was on comfort care. I offered support to his wife and daughter. They did not wish for a visit but appreciated my checking in on them. I let them know how to reach spiritual care through the care team as needed.  Laverda Stribling L. Minetta Aly, M.Div [phone]

## 2023-11-14 NOTE — Progress Notes (Signed)
 Patient transitioned to comfort care 4/15. Remains on fentanyl. Propofol and ketamine. 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 Will continue to titrate meds PRN.  Flora Humphreys, DO Palliative Medicine

## 2023-11-14 NOTE — Plan of Care (Signed)
  Problem: Clinical Measurements: Goal: Diagnostic test results will improve Outcome: Not Progressing Goal: Respiratory complications will improve Outcome: Not Progressing Goal: Cardiovascular complication will be avoided Outcome: Not Progressing   Problem: Activity: Goal: Risk for activity intolerance will decrease Outcome: Not Progressing   Problem: Nutrition: Goal: Adequate nutrition will be maintained Outcome: Not Progressing   Problem: Coping: Goal: Level of anxiety will decrease Outcome: Not Progressing

## 2023-11-16 LAB — CULTURE, BLOOD (ROUTINE X 2): Culture: NO GROWTH

## 2023-11-16 LAB — BODY FLUID CULTURE W GRAM STAIN
Culture: NO GROWTH
Gram Stain: NONE SEEN
Special Requests: NORMAL

## 2023-11-28 DIAGNOSIS — J449 Chronic obstructive pulmonary disease, unspecified: Secondary | ICD-10-CM | POA: Diagnosis not present

## 2023-11-28 NOTE — Death Summary Note (Addendum)
 DEATH SUMMARY   Patient Details  Name: Preston [name] MRN: [id number] DOB: 06/11/1971  Admission/Discharge Information   Admit Date:  04-Nov-2023  Date of Death: Date of Death: 11/19/2023  Time of Death: Time of Death: 0516  Length of Stay: 14-Dec-2023  Referring Physician: Collins Dean, NP   Reason(s) for Hospitalization  Admitted for a ventral incisional incarcerated hernia repair and right robotic assisted laparoscopic partial nephrectomy  Diagnoses  Preliminary cause of death:  Metabolic encephalopathy  Secondary Diagnoses (including complications and co-morbidities):  Principal Problem:   Neoplasm of right kidney Active Problems:   Tobacco abuse   HLD (hyperlipidemia)   Alcohol  abuse   Chronic diastolic congestive heart failure (HCC)   Gastroesophageal reflux disease without esophagitis   Type 2 diabetes mellitus treated without insulin  (HCC)   Essential hypertension   Acute kidney injury (HCC)   Obesity, Class II, BMI 35-39.9   Diastasis recti   Incarcerated incisional hernia s/p repair w mesh 04-Nov-2023   History of Left Renal cell cancer   Hyponatremia   Acute hypoxemic respiratory failure (HCC)   Alcohol  withdrawal delirium (HCC)   ABLA (acute blood loss anemia)   Primary hypertension   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 year old male who was admitted for hernia repair and partial right nephrectomy - Surgical pathology was significant for an adenoma. He initially did well following his surgery but patient became more hypoxic with concerns for alcohol  withdrawal. Tolerated the hernia repair well.  Partial right nephrectomy was well-tolerated.  Did develop hematoma following but remained hemodynamically stable. Was admitted to the intensive care unit where with worsening shortness of breath  ended up on a ventilator. Worsening chest x-ray findings-ARDS, started on antibiotics, diuretics.  Partially tolerated ventilator weaning but mental status precluded extubation Noted to have a forced upward gaze-EEG negative for seizures, head CT was negative for an acute stroke.  Continued to support Despite stopping all sedatives, patient remained persistently encephalopathic.  MRI of the head was obtained with no acute pathology noted  Abdominal CT was repeated-significant for resolving hematoma  With persistent encephalopathy Involved palliative care with ongoing goals of care discussions  Family notes that patient will not want a tracheostomy, will not want to be kept alive artificially and will not want to be dependent  Decision was made to proceed with a one-way extubation.  Tolerated extubation well but never regained consciousness  Patient succumbed to his illness 11-19-2023 at 0516 hrs.      Pertinent Labs and Studies  Significant Diagnostic Studies CT ABDOMEN PELVIS W CONTRAST Result Date: 11/11/2023 CLINICAL DATA:  Fever and leukocytosis with recent surgery. EXAM: CT ABDOMEN AND PELVIS WITH CONTRAST TECHNIQUE: Multidetector CT imaging of the abdomen and pelvis was performed using the standard protocol following bolus administration of intravenous contrast. RADIATION DOSE REDUCTION: This exam was performed according to the departmental dose-optimization program which includes automated exposure control, adjustment of the mA and/or kV according to patient size and/or use of iterative reconstruction technique. CONTRAST:  OMNIPAQUE  IOHEXOL  300 MG/ML  SOLN COMPARISON:  CT abdomen and pelvis 11/08/2018 FINDINGS: Lower chest: There is patchy airspace consolidation in the lung bases, right greater than left, which has increased. Hepatobiliary: Mildly enlarged. No focal liver abnormality is seen. No gallstones, gallbladder wall thickening, or biliary dilatation. Pancreas: Unremarkable.  No  pancreatic ductal dilatation or surrounding inflammatory changes. Spleen: Mildly enlarged. Adrenals/Urinary Tract: There is a low-density indeterminate right adrenal nodule which is unchanged measuring 17 mm. The left adrenal gland is within normal limits. Bladder and distal in ureters are not well seen secondary to streak artifact in the pelvis. There is no hydronephrosis. There is mild nonspecific bilateral perinephric fat stranding. Right renal hypodensities too small to characterize, but likely cysts measuring up to 19 mm. There is likely hyperdense cyst in the superior pole left kidney measuring 17 mm, also unchanged. Stomach/Bowel: No evidence of bowel wall thickening, distention, or inflammatory changes. The appendix is not seen. There are air-fluid levels scattered throughout the colon. Nasogastric tube tip is in the body of the stomach. The stomach is nondilated. Vascular/Lymphatic: Aortic atherosclerosis. No enlarged abdominal or pelvic lymph nodes. Reproductive: Not well evaluated secondary to streak artifact in the pelvis. Other: There is no ascites. Lobulated subcutaneous fluid collection is seen in the anterior abdominal wall measuring 8.7 x 3.1 x 11.8 cm similar in size when compared to prior. Air has resolved within the fluid collection. Small amount of subcutaneous air is present, but has also decreased. There is no abdominal wall hernia. There is skin thickening, subcutaneous edema and rounded subcutaneous nodule/cyst lateral to the right hip which is unchanged. Musculoskeletal: Bilateral hip arthroplasties are present. No acute fractures are seen. Degenerative changes affect the spine. IMPRESSION: 1. Stable size of the lobulated subcutaneous fluid collection in the anterior abdominal wall. Air has resolved within the fluid collection. Abscess is not excluded in the appropriate clinical setting. 2. Air-fluid levels scattered throughout the colon compatible with diarrheal illness. 3. Increasing  patchy airspace consolidation in the lung bases, right greater than left, worrisome for pneumonia. 4. Stable indeterminate right adrenal nodule. This can be further evaluated with adrenal protocol CT or MRI. 5. Stable mild hepatosplenomegaly. 6. Stable skin thickening, subcutaneous edema and rounded subcutaneous nodule/cyst lateral to the right hip. 7. Aortic atherosclerosis. Electronically Signed   By: Tyron Gallon M.D.   On: 11/11/2023 20:37   MR BRAIN WO CONTRAST Result Date: 11/11/2023 CLINICAL DATA:  Mental status change, unknown cause EXAM: MRI HEAD WITHOUT CONTRAST TECHNIQUE: Multiplanar, multiecho pulse sequences of the brain and surrounding structures were obtained without intravenous contrast. COMPARISON:  CT head November 08, 2023. FINDINGS: Brain: No acute infarction, hemorrhage, hydrocephalus, extra-axial collection or mass lesion. Mild scattered T2/FLAIR hyperintensities in the white matter nonspecific but compatible with chronic microvascular ischemic disease. Vascular: Major arterial flow voids are maintained at the skull base. Skull and upper cervical spine: Normal marrow signal. Sinuses/Orbits: Mild paranasal sinus mucosal thickening. No acute orbital findings. Other: Small mastoid effusions. IMPRESSION: No evidence of acute intracranial abnormality. Electronically Signed   By: Stevenson Elbe M.D.   On: 11/11/2023 20:08   DG CHEST PORT 1 VIEW Result Date: 11/11/2023 CLINICAL DATA:  Shortness of breath EXAM: PORTABLE CHEST 1 VIEW COMPARISON:  Chest radiograph dated 11/06/2023 FINDINGS: Lines/tubes: Endotracheal tube tip projects 4.8 cm above the carina. Enteric tube tip reaches the diaphragm and terminates below the field of view. Partially imaged metal tip probe projects over the midline neck. Lungs: Low lung volumes with bronchovascular crowding. Bibasilar hazy and patchy opacities. Diffuse interstitial opacities. Pleura: Blunting of the right costophrenic angle. No definite pneumothorax.  Heart/mediastinum: The heart size and mediastinal contours are within normal limits. Bones: No acute osseous abnormality. IMPRESSION: 1. Endotracheal tube tip projects 4.8 cm above the carina. 2. Bibasilar hazy and patchy opacities, which  may represent atelectasis, aspiration, or pneumonia. 3. Diffuse interstitial opacities, which may represent pulmonary edema. 4. Blunting of the right costophrenic angle, which may represent a small pleural effusion. Electronically Signed   By: Limin  Xu M.D.   On: 11/11/2023 14:39   Rapid EEG Result Date: 11/09/2023 Arleene Lack, MD     11/09/2023  1:21 PM  Patient Name: Preston [name] MRN: [id number] Epilepsy Attending: Arleene Lack Referring Physician/Provider: Deneise Finlay D, NP Duration: 11/08/2023 1136 to 11/09/2023 1102 Patient history: 52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 Rapid EEG to evaluate for seizure Level of alertness: comatose/ lethargic AEDs during EEG study: Phenobarb, GBP Technical aspects: This EEG was obtained using a 10 lead EEG system positioned circumferentially without any parasagittal coverage (rapid EEG). Computer selected EEG is reviewed as  well as background features and all clinically significant events. Description: EEG showed continuous generalized 3 to 6 Hz theta-delta slowing. Hyperventilation and photic stimulation were not performed.   EEG was disconnected between 4/11/025 1707 to 1936 for imaging ABNORMALITY - Continuous slow, generalized IMPRESSION: This limited ceribell EEG is suggestive of moderate to severe diffuse encephalopathy. No seizures or epileptiform discharges were seen throughout the recording. If suspicion for interictal activity remains a concern, a conventional video EEG can be considered. [name]   CT ABDOMEN PELVIS WO CONTRAST Result Date: 11/08/2023 CLINICAL DATA:  Retroperitoneal hematoma, follow up Persistent fever/Large hematoma EXAM: CT ABDOMEN AND PELVIS WITHOUT CONTRAST TECHNIQUE: Multidetector  CT imaging of the abdomen and pelvis was performed following the standard protocol without IV contrast. RADIATION DOSE REDUCTION: This exam was performed according to the departmental dose-optimization program which includes automated exposure control, adjustment of the mA and/or kV according to patient size and/or use of iterative reconstruction technique. COMPARISON:  CT abdomen pelvis 08/06/2023 FINDINGS: Lower chest: Bibasilar dependent airspace opacities, right greater than left. Hepatobiliary: Liver enlarged measuring up to 23 cm. No focal liver abnormality. No gallstones, gallbladder wall thickening, or pericholecystic fluid. No biliary dilatation. Pancreas: No focal lesion. Normal pancreatic contour. No surrounding inflammatory changes. No main pancreatic ductal dilatation. Spleen: Normal in size without focal abnormality. Adrenals/Urinary Tract: No adrenal nodule bilaterally. Nonspecific bilateral perinephric fat stranding. No nephrolithiasis and no hydronephrosis. No ureterolithiasis or hydroureter. The urinary bladder is fully decompressed with Foley catheter in good position. Limited evaluation of the urinary bladder due to streak artifact from bilateral femoral surgical hardware. Stomach/Bowel: Enteric tube tip and side port within the gastric lumen. Sigmoid colon resection noted. Stomach is within normal limits. No evidence of bowel wall thickening or dilatation. Status post appendectomy. Vascular/Lymphatic: Flattened inferior vena cava. No abdominal aorta or iliac aneurysm. Severe atherosclerotic plaque of the aorta and its branches. No abdominal, pelvic, or inguinal lymphadenopathy. Reproductive: Poorly visualized due to streak artifact originating from the bilateral femoral surgical hardware. Other: Trace volume right retroperitoneum high density free fluid suggestive of blood products (2:61). No intraperitoneal free gas. No organized fluid collection. Musculoskeletal: Supraumbilical ventral wall  soft tissue free fluid and emphysema with fluid collection measuring approximately 13 x 3.5 cm. This is in the region of prior ventral wall hernia status post repair. No suspicious lytic or blastic osseous lesions. No acute displaced fracture. Multilevel degenerative changes of the spine. Bilateral total hip arthroplasties partially visualized. IMPRESSION: 1. Bibasilar dependent airspace opacities, right greater than left. Findings could represent a combination of atelectasis and/or infection/inflammation. Limited evaluation on this noncontrast study. 2. Trace volume right retroperitoneal blood products. 3. Supraumbilical ventral wall  soft tissue free fluid and emphysema with associated fluid collection measuring approximately 13 x 3.5 cm in the region of prior ventral wall hernia status post repair. No definite recurrent hernia or enterocutaneous fistulization. Finding could represents abscess formation status post surgery with limited evaluation on this noncontrast study. Finding could also represent postsurgical changes-correlate with timing of surgery. Electronically Signed   By: Morgane  Naveau M.D.   On: 11/08/2023 18:51   CT HEAD WO CONTRAST ( ) Result Date: 11/08/2023 CLINICAL DATA:  Provided history: Headache, neuro deficit. EXAM: CT HEAD WITHOUT CONTRAST TECHNIQUE: Contiguous axial images were obtained from the base of the skull through the vertex without intravenous contrast. RADIATION DOSE REDUCTION: This exam was performed according to the departmental dose-optimization program which includes automated exposure control, adjustment of the mA and/or kV according to patient size and/or use of iterative reconstruction technique. COMPARISON:  Head CT 01/05/2011. FINDINGS: Brain: Generalized parenchymal atrophy. Mild patchy and ill-defined hypoattenuation within the cerebral white matter, nonspecific but most often secondary to chronic small vessel ischemia. There is no acute intracranial hemorrhage. No  demarcated cortical infarct. No extra-axial fluid collection. No evidence of an intracranial mass. No midline shift. Vascular: No hyperdense vessel.  Atherosclerotic calcifications. Skull: No calvarial fracture or aggressive osseous lesion. Sinuses/Orbits: No mass or acute finding within the imaged orbits. Mild mucosal thickening within the bilateral maxillary sinuses at the imaged levels. Other: 15 mm cystic-appearing cutaneous/subcutaneous right facial lesion (overlying the right zygomatic arch), possibly an epidermal inclusion cyst. IMPRESSION: 1. No acute intracranial hemorrhage or acute demarcated cortical infarct. 2. Mild cerebral white matter disease, nonspecific but most often secondary to chronic small vessel ischemia. 3. Generalized parenchymal atrophy. 4. Mild bilateral maxillary sinus mucosal thickening. Electronically Signed   By: Bascom Lily D.O.   On: 11/08/2023 11:04   DG Abd 1 View Result Date: 11/07/2023 CLINICAL DATA:  Enteric catheter placement EXAM: ABDOMEN - 1 VIEW COMPARISON:  11/06/2023 FINDINGS: Frontal view of the lower chest and upper abdomen demonstrates enteric catheter passing below diaphragm, tip and side port projecting over the gastric antrum. Nonspecific gaseous distention of the small bowel. Lung bases are clear. IMPRESSION: 1. Enteric catheter tip projecting over the gastric antrum. 2. Nonspecific gaseous distention of the small bowel, stable. Electronically Signed   By: Bobbye Burrow M.D.   On: 11/07/2023 21:24   DG Abd 1 View Result Date: 11/07/2023 CLINICAL DATA:  Ileus. EXAM: ABDOMEN - 1 VIEW COMPARISON:  Abdominal radiograph yesterday FINDINGS: Enteric tube remains in place. No convincing small bowel distension. There is mild gaseous distension of transverse colon. Soft tissue attenuation from habitus limits assessment. IMPRESSION: Mild gaseous distension of transverse colon. No convincing small bowel distension. Technically limited exam due to habitus. Electronically  Signed   By: Chadwick Colonel M.D.   On: 11/07/2023 00:24   VAS US  LOWER EXTREMITY VENOUS (DVT) Result Date: 11/06/2023  Lower Venous DVT Study Patient Name:  ZYKEE LOYA  Date of Exam:   11/06/2023 Medical Rec #: [id number]        Accession #:    [id number] Date of Birth: [birth year]        Patient Gender: M Patient Age:   89 years Exam Location:  [facility] Procedure:      VAS US  LOWER EXTREMITY VENOUS (DVT) Referring Phys: Roz Cornelia --------------------------------------------------------------------------------  Indications: Possible PE.  Risk Factors: Immobility Cancer Kidney Surgery Partial nephrectomy 10/31/23 obesity. Limitations: Patient movement. Comparison Study: No significant changes seen since previous exam 11/03/23.  Performing Technologist: Estanislao Heimlich  Examination Guidelines: A complete evaluation includes B-mode imaging, spectral Doppler, color Doppler, and power Doppler as needed of all accessible portions of each vessel. Bilateral testing is considered an integral part of a complete examination. Limited examinations for reoccurring indications may be performed as noted. The reflux portion of the exam is performed with the patient in reverse Trendelenburg.  +---------+---------------+---------+-----------+----------+--------------+ RIGHT    CompressibilityPhasicitySpontaneityPropertiesThrombus Aging +---------+---------------+---------+-----------+----------+--------------+ CFV      Full           Yes      Yes                                 +---------+---------------+---------+-----------+----------+--------------+ SFJ      Full                                                        +---------+---------------+---------+-----------+----------+--------------+ FV Prox  Full                                                        +---------+---------------+---------+-----------+----------+--------------+ FV Mid   Full                                                         +---------+---------------+---------+-----------+----------+--------------+ FV DistalFull                                                        +---------+---------------+---------+-----------+----------+--------------+ PFV      Full                                                        +---------+---------------+---------+-----------+----------+--------------+ POP      Full           Yes      Yes                                 +---------+---------------+---------+-----------+----------+--------------+ PTV      Full                    Yes                                 +---------+---------------+---------+-----------+----------+--------------+ PERO     Full                    Yes                                 +---------+---------------+---------+-----------+----------+--------------+   +---------+---------------+---------+-----------+----------+--------------+  LEFT     CompressibilityPhasicitySpontaneityPropertiesThrombus Aging +---------+---------------+---------+-----------+----------+--------------+ CFV      Full           Yes      Yes                                 +---------+---------------+---------+-----------+----------+--------------+ SFJ      Full                                                        +---------+---------------+---------+-----------+----------+--------------+ FV Prox  Full                                                        +---------+---------------+---------+-----------+----------+--------------+ FV Mid   Full                                                        +---------+---------------+---------+-----------+----------+--------------+ FV DistalFull                                                        +---------+---------------+---------+-----------+----------+--------------+ PFV      Full                                                         +---------+---------------+---------+-----------+----------+--------------+ POP      Full           Yes      Yes                                 +---------+---------------+---------+-----------+----------+--------------+ PTV      Full                    Yes                                 +---------+---------------+---------+-----------+----------+--------------+ PERO     Full                    Yes                                 +---------+---------------+---------+-----------+----------+--------------+     Summary: BILATERAL: - No evidence of deep vein thrombosis seen in the lower extremities, bilaterally. -No evidence of popliteal cyst, bilaterally.   *See table(s) above for measurements and observations. Electronically signed by Jimmye Moulds MD on 11/06/2023 at 11:52:04 AM.    Final    DG  Chest Port 1 View Result Date: 11/06/2023 CLINICAL DATA:  Hypoxia EXAM: PORTABLE CHEST 1 VIEW COMPARISON:  11/05/2023 FINDINGS: Endotracheal tube with tip at the clavicular heads. An enteric tube at least reaches the stomach. Extensive bilateral airspace disease with low lung volumes is unchanged. No effusion or air leak is seen. Cardiomegaly and vascular pedicle widening accentuated by rotation. IMPRESSION: Unremarkable hardware with stable low volume chest and extensive airspace disease. Electronically Signed   By: Ronnette Coke M.D.   On: 11/06/2023 09:36   CT Angio Chest Pulmonary Embolism (PE) W or WO Contrast Result Date: 11/05/2023 CLINICAL DATA:  Respiratory failure. EXAM: CT ANGIOGRAPHY CHEST WITH CONTRAST TECHNIQUE: Multidetector CT imaging of the chest was performed using the standard protocol during bolus administration of intravenous contrast. Multiplanar CT image reconstructions and MIPs were obtained to evaluate the vascular anatomy. RADIATION DOSE REDUCTION: This exam was performed according to the departmental dose-optimization program which includes automated exposure control,  adjustment of the mA and/or kV according to patient size and/or use of iterative reconstruction technique. CONTRAST:  75mL OMNIPAQUE  IOHEXOL  350 MG/ML SOLN COMPARISON:  Dec 19, 2021. FINDINGS: Cardiovascular: Mild cardiomegaly is noted. No definite evidence of large central pulmonary embolus seen in the main pulmonary artery or proximal portions of the left and right pulmonary arteries. However, due to persistent respiratory motion artifact, smaller and more peripheral pulmonary emboli cannot be excluded on the basis of this exam, particularly in the lower lobe branches. Mediastinum/Nodes: Endotracheal tube is seen in grossly good position. Nasogastric tube is seen passing through esophagus into stomach. No significant adenopathy is noted. Thyroid  gland is unremarkable. Lungs/Pleura: No pneumothorax or pleural effusion is noted. Bilateral upper lobe opacities are noted concerning for pneumonia. Emphysematous disease is noted. Right lower lobe airspace opacity is noted concerning for pneumonia or atelectasis. Mild left posterior basilar subsegmental atelectasis is noted. Upper Abdomen: No acute abnormality. Musculoskeletal: No chest wall abnormality. No acute or significant osseous findings. Review of the MIP images confirms the above findings. IMPRESSION: No definite evidence of large central pulmonary embolus seen in the main pulmonary artery or proximal portions of the left and right pulmonary arteries. However, due to persistent respiratory motion artifact, smaller and more peripheral pulmonary emboli cannot be excluded on the basis of this exam. Bilateral upper lobe and right lower lobe pneumonia is noted. Aortic Atherosclerosis (ICD10-I70.0) and Emphysema (ICD10-J43.9). Electronically Signed   By: Rosalene Colon M.D.   On: 11/05/2023 14:44   DG Abd 1 View Result Date: 11/05/2023 CLINICAL DATA:  Evaluate enteric tube placement. EXAM: ABDOMEN - 1 VIEW COMPARISON:  11/03/2023 FINDINGS: Interval placement of  enteric tube. Tip and side port are in satisfactory position below the level of the GE junction. IMPRESSION: Satisfactory position of enteric tube. Electronically Signed   By: Kimberley Penman M.D.   On: 11/05/2023 06:26   DG CHEST PORT 1 VIEW Result Date: 11/05/2023 CLINICAL DATA:  Status post intubation. EXAM: PORTABLE CHEST 1 VIEW COMPARISON:  November 02, 2023 FINDINGS: Limited study secondary to patient rotation. An endotracheal tube is seen with its distal tip approximately 4.0 cm from the carina. The heart size and mediastinal contours are within normal limits. Moderate to marked severity diffuse bilateral infiltrates are noted. No pleural effusion or pneumothorax is identified. The visualized skeletal structures are unremarkable. IMPRESSION: 1. Endotracheal tube positioning, as described above. 2. Moderate to marked severity diffuse bilateral infiltrates. Electronically Signed   By: Virgle Grime M.D.   On: 11/05/2023 02:27  NM Pulmonary Perf and Vent Result Date: 11/04/2023 CLINICAL DATA:  Evaluate for pulmonary embolism. History of COPD and smoker. Asthma. EXAM: NUCLEAR MEDICINE PERFUSION LUNG SCAN TECHNIQUE: Perfusion images were obtained in multiple projections after intravenous injection of radiopharmaceutical. Ventilation scans intentionally deferred if perfusion scan and chest x-ray adequate for interpretation during COVID 19 epidemic. RADIOPHARMACEUTICALS:  4.02 mCi Tc-20m MAA IV COMPARISON:  Chest radiograph from 11/02/2023 FINDINGS: On the anterior and posterior projection images there is no peripheral segmental perfusion defects to indicate pulmonary embolism. The right posterior oblique and left anterior oblique images were obscured by motion artifact. Patient was unable to tolerate the lateral projection images. IMPRESSION: 1. No signs of acute pulmonary embolism within the limitations described above. Electronically Signed   By: Kimberley Penman M.D.   On: 11/04/2023 12:16   VAS US  LOWER  EXTREMITY VENOUS (DVT) Result Date: 11/04/2023  Lower Venous DVT Study Patient Name:  Preston [name]  Date of Exam:   11/03/2023 Medical Rec #: [id number]        Accession #:    [id number] Date of Birth: 1970/10/22        Patient Gender: M Patient Age:   62 years Exam Location:  [facility] Procedure:      VAS US  LOWER EXTREMITY VENOUS (DVT) Referring Phys: RAMESH KC --------------------------------------------------------------------------------  Indications: SOB, and Edema.  Risk Factors: Surgery Hernia repair and partial right nephrectomy (10/31/23) Hx of left renal cell cancer, recent diagnosis of right renal cell cancer. Comparison Study: Previous exam on 03/13/2015 was negative for DVT Performing Technologist: Arlyce Berger RVT, RDMS  Examination Guidelines: A complete evaluation includes B-mode imaging, spectral Doppler, color Doppler, and power Doppler as needed of all accessible portions of each vessel. Bilateral testing is considered an integral part of a complete examination. Limited examinations for reoccurring indications may be performed as noted. The reflux portion of the exam is performed with the patient in reverse Trendelenburg.  +---------+---------------+---------+-----------+----------+--------------+ RIGHT    CompressibilityPhasicitySpontaneityPropertiesThrombus Aging +---------+---------------+---------+-----------+----------+--------------+ CFV      Full           Yes      Yes                                 +---------+---------------+---------+-----------+----------+--------------+ SFJ      Full                                                        +---------+---------------+---------+-----------+----------+--------------+ FV Prox  Full           Yes      Yes                                 +---------+---------------+---------+-----------+----------+--------------+ FV Mid   Full           Yes      Yes                                  +---------+---------------+---------+-----------+----------+--------------+ FV DistalFull           Yes      Yes                                 +---------+---------------+---------+-----------+----------+--------------+  PFV      Full                                                        +---------+---------------+---------+-----------+----------+--------------+ POP      Full           Yes      Yes                                 +---------+---------------+---------+-----------+----------+--------------+ PTV      Full                                                        +---------+---------------+---------+-----------+----------+--------------+ PERO     Full                                                        +---------+---------------+---------+-----------+----------+--------------+   +---------+---------------+---------+-----------+----------+--------------+ LEFT     CompressibilityPhasicitySpontaneityPropertiesThrombus Aging +---------+---------------+---------+-----------+----------+--------------+ CFV      Full           Yes      Yes                                 +---------+---------------+---------+-----------+----------+--------------+ SFJ      Full                                                        +---------+---------------+---------+-----------+----------+--------------+ FV Prox  Full           Yes      Yes                                 +---------+---------------+---------+-----------+----------+--------------+ FV Mid   Full           Yes      Yes                                 +---------+---------------+---------+-----------+----------+--------------+ FV DistalFull           Yes      Yes                                 +---------+---------------+---------+-----------+----------+--------------+ PFV      Full                                                         +---------+---------------+---------+-----------+----------+--------------+  POP      Full           Yes      Yes                                 +---------+---------------+---------+-----------+----------+--------------+ PTV      Full                                                        +---------+---------------+---------+-----------+----------+--------------+ PERO     Full                                                        +---------+---------------+---------+-----------+----------+--------------+     Summary: BILATERAL: - No evidence of deep vein thrombosis seen in the lower extremities, bilaterally. -No evidence of popliteal cyst, bilaterally.   *See table(s) above for measurements and observations. Electronically signed by Genny Kid MD on 11/04/2023 at 8:59:01 AM.    Final    DG Abd Portable 1V Result Date: 11/03/2023 CLINICAL DATA: Constipation EXAM: PORTABLE ABDOMEN - 1 VIEW COMPARISON:  CT 08/06/2023 FINDINGS: Several mildly dilated small bowel loops in the mid abdomen, possibly in the area of ventral hernia seen on prior CT. Cannot exclude small bowel obstruction. Gas is seen within nondistended colon. No free air, organomegaly or suspicious calcification. IMPRESSION: Mildly prominent mid abdominal small bowel loops. Cannot exclude small bowel obstruction, particularly given the ventral hernia seen on prior CT. Electronically Signed   By: Janeece Mechanic M.D.   On: 11/03/2023 12:16   ECHOCARDIOGRAM COMPLETE Result Date: 11/02/2023    ECHOCARDIOGRAM REPORT   Patient Name:   Preston [name] Date of Exam: 11/02/2023 Medical Rec #:  [id number]       Height:       75.0 in Accession #:    [id number]      Weight:       297.6 lb Date of Birth:  [birth year]       BSA:          2.598 m Patient Age:    52 years        BP:           112/70 mmHg Patient Gender: M               HR:           88 bpm. Exam Location:  Inpatient Procedure: 2D Echo, Cardiac Doppler, Color Doppler and Intracardiac             Opacification Agent (Both Spectral and Color Flow Doppler were            utilized during procedure). 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 CHF, Signs/Symptoms:Syncope; Risk  Factors:Current Smoker, Dyslipidemia and Hypertension. ETOH.                 Cancer.  Sonographer:    Raynelle Callow RDCS Referring Phys: [id number] DAVID MANUEL ORTIZ  Sonographer Comments: Technically difficult study due to poor echo windows and patient is obese. Image acquisition challenging due to patient body habitus. IMPRESSIONS  1. Difficult acoustic windows limit study even with Definity  used.  2. Difficult acoustic windows Definity  used Basal inferior/inferoseptal hypokinesis. . Left ventricular ejection fraction, by estimation, is 50 to 55%. The left ventricle has low normal function. The left ventricular internal cavity size was mildly dilated. Left ventricular diastolic parameters were normal.  3. Right ventricular systolic function is mildly reduced. The right ventricular size is mildly enlarged. Tricuspid regurgitation signal is inadequate for assessing PA pressure.  4. The mitral valve is grossly normal. Trivial mitral valve regurgitation.  5. The aortic valve is tricuspid. Aortic valve regurgitation is not visualized.  6. The inferior vena cava is normal in size with greater than 50% respiratory variability, suggesting right atrial pressure of 3 mmHg. FINDINGS  Left Ventricle: Difficult acoustic windows Definity  used Basal inferior/inferoseptal hypokinesis. Left ventricular ejection fraction, by estimation, is 50 to 55%. The left ventricle has low normal function. Definity  contrast agent was given IV to delineate the left ventricular endocardial borders. The left ventricular internal cavity size was mildly  dilated. There is no left ventricular hypertrophy. Left ventricular diastolic parameters were normal. Right Ventricle: The right ventricular size is mildly enlarged. Right vetricular wall thickness was not assessed. Right ventricular systolic function is mildly reduced. Tricuspid regurgitation signal is inadequate for assessing PA pressure. Left Atrium: Left atrial size was normal in size. Right Atrium: Right atrial size was normal in size. Pericardium: There is no evidence of pericardial effusion. Mitral Valve: The mitral valve is grossly normal. Trivial mitral valve regurgitation. Tricuspid Valve: The tricuspid valve is normal in structure. Tricuspid valve regurgitation is trivial. Aortic Valve: The aortic valve is tricuspid. Aortic valve regurgitation is not visualized. Pulmonic Valve: The pulmonic valve was normal in structure. Pulmonic valve regurgitation is not visualized. Aorta: The aortic root and ascending aorta are structurally normal, with no evidence of dilitation. Venous: The inferior vena cava is normal in size with greater than 50% respiratory variability, suggesting right atrial pressure of 3 mmHg. IAS/Shunts: No atrial level shunt detected by color flow Doppler.  LEFT VENTRICLE PLAX 2D LVIDd:         5.60 cm      Diastology LVIDs:         4.10 cm      LV e' medial:    7.29 cm/s LV PW:         1.00 cm      LV E/e' medial:  10.1 LV IVS:        1.00 cm      LV e' lateral:   10.20 cm/s                             LV E/e' lateral: 7.2  LV Volumes (MOD) LV vol d, MOD A2C: 121.0 ml LV vol d, MOD A4C: 143.0 ml LV vol s, MOD A2C: 59.1 ml LV vol s, MOD A4C: 67.3 ml LV SV MOD A2C:     61.9 ml LV SV MOD A4C:     143.0 ml LV SV MOD BP:      67.5 ml RIGHT VENTRICLE  IVC RV S prime:     16.10 cm/s  IVC diam: 1.40 cm TAPSE (M-mode): 1.9 cm LEFT ATRIUM             Index        RIGHT ATRIUM           Index LA diam:        4.10 cm 1.58 cm/m   RA Area:     20.30 cm LA Vol (A2C):   41.9 ml 16.13 ml/m  RA  Volume:   69.90 ml  26.90 ml/m LA Vol (A4C):   43.4 ml 16.70 ml/m LA Biplane Vol: 44.4 ml 17.09 ml/m  AORTIC VALVE LVOT Vmax:   104.00 cm/s LVOT Vmean:  68.800 cm/s LVOT VTI:    0.159 m  AORTA Ao Root diam: 3.80 cm Ao Asc diam:  3.90 cm MITRAL VALVE MV Area (PHT): 3.84 cm    SHUNTS MV Decel Time: 198 msec    Systemic VTI: 0.16 m MV E velocity: 73.80 cm/s MV A velocity: 72.85 cm/s MV E/A ratio:  1.01 Ola Berger MD Electronically signed by Ola Berger MD Signature Date/Time: 11/02/2023/7:50:48 PM    Final    DG Chest 1 View Result Date: 11/02/2023 CLINICAL DATA:  Hypoxia.  Recent nephrectomy. EXAM: CHEST  1 VIEW COMPARISON:  12/16/2020 FINDINGS: Stable heart size and mediastinal contours. Streaky opacities in the lung bases typical of atelectasis. There is moderate perihilar peribronchial thickening. No pneumothorax or significant pleural effusion. On limited assessment, no acute osseous findings. IMPRESSION: 1. Moderate perihilar peribronchial thickening, can be seen with bronchitis or reactive airways disease. 2. Bibasilar atelectasis. Electronically Signed   By: Chadwick Colonel M.D.   On: 11/02/2023 17:07    Microbiology No results found for this or any previous visit (from the past 240 hours).  Lab Basic Metabolic Panel: No results for input(s): "NA", "K", "CL", "CO2", "GLUCOSE", "BUN", "CREATININE", "CALCIUM ", "MG", "PHOS" in the last 168 hours. Liver Function Tests: No results for input(s): "AST", "ALT", "ALKPHOS", "BILITOT", "PROT", "ALBUMIN " in the last 168 hours. No results for input(s): "LIPASE", "AMYLASE" in the last 168 hours. No results for input(s): "AMMONIA" in the last 168 hours. CBC: No results for input(s): "WBC", "NEUTROABS", "HGB", "HCT", "MCV", "PLT" in the last 168 hours. Cardiac Enzymes: No results for input(s): "CKTOTAL", "CKMB", "CKMBINDEX", "TROPONINI" in the last 168 hours. Sepsis Labs: No results for input(s): "PROCALCITON", "WBC", "LATICACIDVEN" in the last 168  hours.  Procedures/Operations  11/04/2023-endotracheal intubation   Emmanuelle Coxe A Ramond Darnell 11/25/2023, 1:39 PM

## 2023-11-28 NOTE — Progress Notes (Signed)
 Patient was kept in the hospital for new oxygen requirement Developed symptoms of alcohol  withdrawal, known to be a heavy alcohol  user at baseline

## 2023-11-28 DEATH — deceased

## 2023-12-29 DIAGNOSIS — J449 Chronic obstructive pulmonary disease, unspecified: Secondary | ICD-10-CM | POA: Diagnosis not present

## 2024-01-14 ENCOUNTER — Ambulatory Visit: Payer: Self-pay | Admitting: Nurse Practitioner

## 2024-01-28 DIAGNOSIS — J449 Chronic obstructive pulmonary disease, unspecified: Secondary | ICD-10-CM | POA: Diagnosis not present

## 2024-02-28 DIAGNOSIS — J449 Chronic obstructive pulmonary disease, unspecified: Secondary | ICD-10-CM | POA: Diagnosis not present
